# Patient Record
Sex: Male | Born: 1935 | Race: White | Hispanic: No | Smoking: Former smoker
Health system: Southern US, Community
[De-identification: ages and names within clinical notes are randomized; demographics above are authoritative.]

## PROBLEM LIST (undated history)

## (undated) DIAGNOSIS — I209 Angina pectoris, unspecified: Secondary | ICD-10-CM

## (undated) DIAGNOSIS — K219 Gastro-esophageal reflux disease without esophagitis: Secondary | ICD-10-CM

## (undated) DIAGNOSIS — M1990 Unspecified osteoarthritis, unspecified site: Secondary | ICD-10-CM

## (undated) DIAGNOSIS — J449 Chronic obstructive pulmonary disease, unspecified: Secondary | ICD-10-CM

## (undated) DIAGNOSIS — I10 Essential (primary) hypertension: Secondary | ICD-10-CM

## (undated) DIAGNOSIS — I255 Ischemic cardiomyopathy: Secondary | ICD-10-CM

## (undated) DIAGNOSIS — I4891 Unspecified atrial fibrillation: Secondary | ICD-10-CM

## (undated) DIAGNOSIS — E785 Hyperlipidemia, unspecified: Secondary | ICD-10-CM

## (undated) DIAGNOSIS — I213 ST elevation (STEMI) myocardial infarction of unspecified site: Secondary | ICD-10-CM

## (undated) DIAGNOSIS — E7800 Pure hypercholesterolemia, unspecified: Secondary | ICD-10-CM

## (undated) DIAGNOSIS — F419 Anxiety disorder, unspecified: Secondary | ICD-10-CM

## (undated) DIAGNOSIS — J189 Pneumonia, unspecified organism: Secondary | ICD-10-CM

## (undated) DIAGNOSIS — I739 Peripheral vascular disease, unspecified: Secondary | ICD-10-CM

## (undated) DIAGNOSIS — I509 Heart failure, unspecified: Secondary | ICD-10-CM

## (undated) DIAGNOSIS — I714 Abdominal aortic aneurysm, without rupture, unspecified: Secondary | ICD-10-CM

## (undated) DIAGNOSIS — I2510 Atherosclerotic heart disease of native coronary artery without angina pectoris: Secondary | ICD-10-CM

## (undated) HISTORY — DX: Atherosclerotic heart disease of native coronary artery without angina pectoris: I25.10

## (undated) HISTORY — PX: HIP ARTHROPLASTY: SHX981

## (undated) HISTORY — PX: CARDIAC CATHETERIZATION: SHX172

## (undated) HISTORY — DX: Peripheral vascular disease, unspecified: I73.9

## (undated) HISTORY — DX: Chronic obstructive pulmonary disease, unspecified: J44.9

## (undated) HISTORY — DX: Essential (primary) hypertension: I10

## (undated) HISTORY — PX: BACK SURGERY: SHX140

## (undated) HISTORY — DX: Heart failure, unspecified: I50.9

## (undated) HISTORY — PX: KNEE SURGERY: SHX244

## (undated) HISTORY — DX: Unspecified atrial fibrillation: I48.91

## (undated) HISTORY — DX: Ischemic cardiomyopathy: I25.5

## (undated) HISTORY — DX: Hyperlipidemia, unspecified: E78.5

## (undated) HISTORY — DX: Gastro-esophageal reflux disease without esophagitis: K21.9

## (undated) HISTORY — DX: Pure hypercholesterolemia, unspecified: E78.00

## (undated) HISTORY — DX: ST elevation (STEMI) myocardial infarction of unspecified site: I21.3

## (undated) HISTORY — DX: Abdominal aortic aneurysm, without rupture: I71.4

## (undated) HISTORY — DX: Pneumonia, unspecified organism: J18.9

## (undated) HISTORY — DX: Anxiety disorder, unspecified: F41.9

## (undated) HISTORY — DX: Abdominal aortic aneurysm, without rupture, unspecified: I71.40

## (undated) HISTORY — DX: Unspecified osteoarthritis, unspecified site: M19.90

---

## 2003-10-25 ENCOUNTER — Inpatient Hospital Stay (HOSPITAL_COMMUNITY): Admission: AD | Admit: 2003-10-25 | Discharge: 2003-10-29 | Payer: Self-pay | Admitting: Internal Medicine

## 2003-10-25 ENCOUNTER — Encounter: Admission: RE | Admit: 2003-10-25 | Discharge: 2003-10-25 | Payer: Self-pay | Admitting: Internal Medicine

## 2003-12-23 ENCOUNTER — Encounter: Admission: RE | Admit: 2003-12-23 | Discharge: 2003-12-23 | Payer: Self-pay | Admitting: Orthopedic Surgery

## 2003-12-27 ENCOUNTER — Ambulatory Visit (HOSPITAL_COMMUNITY): Admission: RE | Admit: 2003-12-27 | Discharge: 2003-12-27 | Payer: Self-pay | Admitting: Orthopedic Surgery

## 2003-12-27 ENCOUNTER — Ambulatory Visit (HOSPITAL_BASED_OUTPATIENT_CLINIC_OR_DEPARTMENT_OTHER): Admission: RE | Admit: 2003-12-27 | Discharge: 2003-12-27 | Payer: Self-pay | Admitting: Orthopedic Surgery

## 2004-06-19 ENCOUNTER — Encounter
Admission: RE | Admit: 2004-06-19 | Discharge: 2004-06-19 | Payer: Self-pay | Admitting: Physical Medicine and Rehabilitation

## 2004-10-16 ENCOUNTER — Encounter: Admission: RE | Admit: 2004-10-16 | Discharge: 2004-10-16 | Payer: Self-pay | Admitting: Specialist

## 2004-11-01 ENCOUNTER — Inpatient Hospital Stay (HOSPITAL_COMMUNITY): Admission: RE | Admit: 2004-11-01 | Discharge: 2004-11-04 | Payer: Self-pay | Admitting: Specialist

## 2005-02-05 ENCOUNTER — Encounter: Admission: RE | Admit: 2005-02-05 | Discharge: 2005-02-05 | Payer: Self-pay | Admitting: Specialist

## 2006-10-09 ENCOUNTER — Inpatient Hospital Stay (HOSPITAL_COMMUNITY): Admission: RE | Admit: 2006-10-09 | Discharge: 2006-10-10 | Payer: Self-pay | Admitting: Specialist

## 2007-01-02 ENCOUNTER — Ambulatory Visit (HOSPITAL_COMMUNITY): Admission: RE | Admit: 2007-01-02 | Discharge: 2007-01-02 | Payer: Self-pay | Admitting: Internal Medicine

## 2007-01-21 ENCOUNTER — Encounter: Admission: RE | Admit: 2007-01-21 | Discharge: 2007-01-21 | Payer: Self-pay | Admitting: Specialist

## 2007-07-14 ENCOUNTER — Ambulatory Visit (HOSPITAL_COMMUNITY): Admission: RE | Admit: 2007-07-14 | Discharge: 2007-07-14 | Payer: Self-pay | Admitting: *Deleted

## 2007-10-08 ENCOUNTER — Inpatient Hospital Stay (HOSPITAL_COMMUNITY): Admission: RE | Admit: 2007-10-08 | Discharge: 2007-10-12 | Payer: Self-pay | Admitting: Specialist

## 2009-06-21 ENCOUNTER — Ambulatory Visit: Payer: Self-pay | Admitting: Vascular Surgery

## 2009-09-29 ENCOUNTER — Inpatient Hospital Stay (HOSPITAL_COMMUNITY): Admission: EM | Admit: 2009-09-29 | Discharge: 2009-10-10 | Payer: Self-pay | Admitting: Emergency Medicine

## 2009-09-29 ENCOUNTER — Ambulatory Visit: Payer: Self-pay | Admitting: Cardiovascular Disease

## 2009-10-02 ENCOUNTER — Encounter: Payer: Self-pay | Admitting: Cardiovascular Disease

## 2010-04-20 ENCOUNTER — Ambulatory Visit: Payer: Self-pay | Admitting: Cardiology

## 2010-05-04 ENCOUNTER — Ambulatory Visit: Payer: Self-pay | Admitting: *Deleted

## 2010-05-29 ENCOUNTER — Ambulatory Visit: Payer: Self-pay | Admitting: Cardiology

## 2010-06-22 ENCOUNTER — Encounter: Admission: RE | Admit: 2010-06-22 | Discharge: 2010-06-22 | Payer: Self-pay | Admitting: Cardiology

## 2010-06-26 ENCOUNTER — Ambulatory Visit: Payer: Self-pay | Admitting: Cardiology

## 2010-07-10 ENCOUNTER — Ambulatory Visit: Payer: Self-pay | Admitting: Cardiology

## 2010-08-07 ENCOUNTER — Ambulatory Visit: Payer: Self-pay | Admitting: Cardiology

## 2010-08-21 ENCOUNTER — Ambulatory Visit: Payer: Self-pay | Admitting: Cardiovascular Disease

## 2010-08-27 ENCOUNTER — Ambulatory Visit: Payer: Self-pay | Admitting: Cardiology

## 2010-09-05 ENCOUNTER — Telehealth (INDEPENDENT_AMBULATORY_CARE_PROVIDER_SITE_OTHER): Payer: Self-pay | Admitting: *Deleted

## 2010-09-06 ENCOUNTER — Ambulatory Visit (HOSPITAL_COMMUNITY)
Admission: RE | Admit: 2010-09-06 | Discharge: 2010-09-06 | Payer: Self-pay | Source: Home / Self Care | Attending: Cardiology | Admitting: Cardiology

## 2010-09-06 ENCOUNTER — Ambulatory Visit: Payer: Self-pay

## 2010-09-06 ENCOUNTER — Encounter: Payer: Self-pay | Admitting: Cardiology

## 2010-09-06 ENCOUNTER — Encounter (HOSPITAL_COMMUNITY)
Admission: RE | Admit: 2010-09-06 | Discharge: 2010-10-16 | Payer: Self-pay | Source: Home / Self Care | Attending: Cardiology | Admitting: Cardiology

## 2010-09-20 ENCOUNTER — Ambulatory Visit: Payer: Self-pay | Admitting: Cardiovascular Disease

## 2010-09-26 DIAGNOSIS — I213 ST elevation (STEMI) myocardial infarction of unspecified site: Secondary | ICD-10-CM

## 2010-09-26 HISTORY — DX: ST elevation (STEMI) myocardial infarction of unspecified site: I21.3

## 2010-10-07 ENCOUNTER — Encounter: Payer: Self-pay | Admitting: Specialist

## 2010-10-16 LAB — COMPREHENSIVE METABOLIC PANEL
ALT: 16 U/L (ref 0–53)
AST: 23 U/L (ref 0–37)
Albumin: 4.2 g/dL (ref 3.5–5.2)
Alkaline Phosphatase: 73 U/L (ref 39–117)
GFR calc Af Amer: >60 mL/min (ref >60)
Glucose, Bld: 105 mg/dL — ABNORMAL HIGH (ref 70–99)
Potassium: 3.5 mEq/L (ref 3.5–5.1)
Sodium: 139 mEq/L (ref 135–145)
Total Protein: 6.9 g/dL (ref 6.0–8.3)

## 2010-10-16 LAB — DIFFERENTIAL
Basophils Absolute: 0 10*3/uL (ref 0.0–0.1)
Basophils Relative: 0 % (ref 0–1)
Eosinophils Relative: 1 % (ref 0–5)
Lymphocytes Relative: 17 % (ref 12–46)
Neutro Abs: 7.6 10*3/uL (ref 1.7–7.7)

## 2010-10-16 LAB — CBC
HCT: 44.7 % (ref 39.0–52.0)
Hemoglobin: 14.6 g/dL (ref 13.0–17.0)
RDW: 13 % (ref 11.5–15.5)
WBC: 10.4 10*3/uL (ref 4.0–10.5)

## 2010-10-16 LAB — PROTIME-INR: Prothrombin Time: 22.4 seconds — ABNORMAL HIGH (ref 11.6–15.2)

## 2010-10-16 LAB — URINE MICROSCOPIC-ADD ON

## 2010-10-16 LAB — SURGICAL PCR SCREEN
MRSA, PCR: NEGATIVE
Staphylococcus aureus: NEGATIVE

## 2010-10-17 ENCOUNTER — Encounter (INDEPENDENT_AMBULATORY_CARE_PROVIDER_SITE_OTHER): Payer: Medicare Other

## 2010-10-17 DIAGNOSIS — Z7901 Long term (current) use of anticoagulants: Secondary | ICD-10-CM

## 2010-10-18 NOTE — Progress Notes (Signed)
Summary: Nuclear pre procedure  Phone Note Outgoing Call Call back at [facility] Phone [phone]   Call placed by: Rea College, CMA,  September 05, 2010 5:10 PM Call placed to: Patient Summary of Call: Reviewed information on My[employer] Information Sheet (see scanned document for further details).  Spoke with patient.      Nuclear Med Background Indications for Stress Test: Evaluation for Ischemia, Surgical Clearance, PTCA Patency  Indications Comments: Pending (R) THR  History: Angioplasty, COPD, Echo, Heart Catheterization, Myocardial Infarction, Myocardial Perfusion Study  History Comments: '08 FAO:ZHYQM inferior defect, EF=38%; 1/11 anterior STEMI>PTCA-LAD; 3/11 Echo:EF=55%; chronic afib, AAA (3.0 x 3.2 cm)     Nuclear Pre-Procedure Cardiac Risk Factors: Family History - CAD, History of Smoking, Lipids, PVD

## 2010-10-18 NOTE — Assessment & Plan Note (Signed)
Summary: Cardiology Nuclear Testing  Nuclear Med Background Indications for Stress Test: Evaluation for Ischemia, Surgical Clearance, PTCA Patency  Indications Comments: Pending (R) THR  History: Angioplasty, COPD, Echo, Heart Catheterization, Myocardial Infarction, Myocardial Perfusion Study  History Comments: '08 ZOX:WRUEA inferior defect, EF=38%; 1/11 anterior STEMI>PTCA-LAD; 3/11 Echo:EF=55%; chronic afib, AAA (3.0 x 3.2 cm)  Symptoms: DOE, Fatigue, Palpitations, SOB    Nuclear Pre-Procedure Cardiac Risk Factors: Family History - CAD, History of Smoking, Lipids, PVD Caffeine/Decaff Intake: None NPO After: 7:30 PM Lungs: clear IV 0.9% NS with Angio Cath: 22g     IV Site: R Antecubital IV Started by: Irean Hong, RN Chest Size (in) 42     Height (in): 69 Weight (lb): 178 BMI: 26.38 Tech Comments: Held bisoprolol 24 hrs. Nebilizer treatment at 7:00 AM today at  home. Baseline diminished lung fields,no wheezing, and O2 Sat 98% RA.Patsy Edwards,RN.  Nuclear Med Study 1 or 2 day study:  1 day     Stress Test Type:  Eugenie Birks Reading MD:  Olga Millers, MD     Referring MD:  S.Tennant Resting Radionuclide:  Technetium 47m Tetrofosmin     Resting Radionuclide Dose:  11.0 mCi  Stress Radionuclide:  Technetium 41m Tetrofosmin     Stress Radionuclide Dose:  33.0 mCi   Stress Protocol  Max Systolic BP: 156 mm Hg Lexiscan: 0.4 mg   Stress Test Technologist:  Milana Na, EMT-P     Nuclear Technologist:  Doyne Keel, CNMT  Rest Procedure  Myocardial perfusion imaging was performed at rest 45 minutes following the intravenous administration of Technetium 86m Tetrofosmin.  Stress Procedure  The patient received IV Lexiscan 0.4 mg over 15-seconds.  Technetium 18m Tetrofosmin injected at 30-seconds.  There were no significant changes and freq pvcs/vcuplets with infusion.  Quantitative spect images were obtained after a 45 minute delay.  QPS Raw Data Images:  There is no  interference from other nuclear activity. Stress Images:  There is decreased uptake in the inferior wall Rest Images:  There is decreased uptake in the inferior wall, less prominent compared to the stress images. Subtraction (SDS):  These findings are consistent with prior inferior infarct and mild peri-infarct ischemia. Transient Ischemic Dilatation:  1.01  (Normal <1.22)  Lung/Heart Ratio:  0.27  (Normal <0.45)  Quantitative Gated Spect Images QGS cine images:  not gated; LVE   Overall Impression  Exercise Capacity: Lexiscan with no exercise. BP Response: Normal blood pressure response. Clinical Symptoms: No chest pain ECG Impression: No significant ST segment change suggestive of ischemia; patient in atrial fibrillation during the study. Overall Impression: Abnormal lexiscan nuclear study with prior inferior infarct and mild peri-infarct ischemia.  Appended Document: Cardiology Nuclear Testing COPY SENT DR. Creola Corn

## 2010-10-25 ENCOUNTER — Inpatient Hospital Stay (HOSPITAL_COMMUNITY)
Admission: RE | Admit: 2010-10-25 | Discharge: 2010-10-29 | DRG: 470 | Disposition: A | Discharge status: Skilled Nursing Facility | Payer: Medicare Other | Attending: Specialist | Admitting: Specialist

## 2010-10-25 ENCOUNTER — Inpatient Hospital Stay (HOSPITAL_COMMUNITY): Payer: Medicare Other

## 2010-10-25 DIAGNOSIS — M169 Osteoarthritis of hip, unspecified: Principal | ICD-10-CM | POA: Diagnosis present

## 2010-10-25 DIAGNOSIS — I2510 Atherosclerotic heart disease of native coronary artery without angina pectoris: Secondary | ICD-10-CM | POA: Diagnosis present

## 2010-10-25 DIAGNOSIS — E7800 Pure hypercholesterolemia, unspecified: Secondary | ICD-10-CM | POA: Diagnosis present

## 2010-10-25 DIAGNOSIS — M1610 Unilateral primary osteoarthritis, unspecified hip: Principal | ICD-10-CM | POA: Diagnosis present

## 2010-10-25 DIAGNOSIS — Z9861 Coronary angioplasty status: Secondary | ICD-10-CM

## 2010-10-25 DIAGNOSIS — E871 Hypo-osmolality and hyponatremia: Secondary | ICD-10-CM | POA: Diagnosis not present

## 2010-10-25 DIAGNOSIS — I714 Abdominal aortic aneurysm, without rupture, unspecified: Secondary | ICD-10-CM | POA: Diagnosis present

## 2010-10-25 DIAGNOSIS — I4891 Unspecified atrial fibrillation: Secondary | ICD-10-CM | POA: Diagnosis present

## 2010-10-25 DIAGNOSIS — J449 Chronic obstructive pulmonary disease, unspecified: Secondary | ICD-10-CM | POA: Diagnosis present

## 2010-10-25 DIAGNOSIS — I739 Peripheral vascular disease, unspecified: Secondary | ICD-10-CM | POA: Diagnosis present

## 2010-10-25 DIAGNOSIS — F17200 Nicotine dependence, unspecified, uncomplicated: Secondary | ICD-10-CM | POA: Diagnosis present

## 2010-10-25 DIAGNOSIS — D649 Anemia, unspecified: Secondary | ICD-10-CM | POA: Diagnosis not present

## 2010-10-25 DIAGNOSIS — Z96649 Presence of unspecified artificial hip joint: Secondary | ICD-10-CM

## 2010-10-25 DIAGNOSIS — I252 Old myocardial infarction: Secondary | ICD-10-CM

## 2010-10-25 DIAGNOSIS — J4489 Other specified chronic obstructive pulmonary disease: Secondary | ICD-10-CM | POA: Diagnosis present

## 2010-10-25 DIAGNOSIS — I4949 Other premature depolarization: Secondary | ICD-10-CM | POA: Diagnosis not present

## 2010-10-25 LAB — CBC
HCT: 37 % — ABNORMAL LOW (ref 39.0–52.0)
MCV: 92.5 fL (ref 78.0–100.0)
RBC: 4 MIL/uL — ABNORMAL LOW (ref 4.22–5.81)
RDW: 12.9 % (ref 11.5–15.5)
WBC: 14 10*3/uL — ABNORMAL HIGH (ref 4.0–10.5)

## 2010-10-25 LAB — BASIC METABOLIC PANEL
BUN: 17 mg/dL (ref 6–23)
Chloride: 106 mEq/L (ref 96–112)
GFR calc non Af Amer: >60 mL/min (ref >60)
Glucose, Bld: 155 mg/dL — ABNORMAL HIGH (ref 70–99)
Potassium: 4.3 mEq/L (ref 3.5–5.1)
Sodium: 140 mEq/L (ref 135–145)

## 2010-10-25 LAB — TYPE AND SCREEN

## 2010-10-26 LAB — BASIC METABOLIC PANEL
CO2: 26 mEq/L (ref 19–32)
Chloride: 104 mEq/L (ref 96–112)
GFR calc Af Amer: >60 mL/min (ref >60)
Potassium: 4.3 mEq/L (ref 3.5–5.1)

## 2010-10-26 LAB — CBC
HCT: 31.6 % — ABNORMAL LOW (ref 39.0–52.0)
Hemoglobin: 10.5 g/dL — ABNORMAL LOW (ref 13.0–17.0)
MCHC: 33.2 g/dL (ref 30.0–36.0)
MCV: 91.9 fL (ref 78.0–100.0)
RDW: 12.8 % (ref 11.5–15.5)
WBC: 9.9 10*3/uL (ref 4.0–10.5)

## 2010-10-26 LAB — PROTIME-INR: INR: 1.05 (ref 0.00–1.49)

## 2010-10-27 LAB — CBC
HCT: 33.2 % — ABNORMAL LOW (ref 39.0–52.0)
Hemoglobin: 10.9 g/dL — ABNORMAL LOW (ref 13.0–17.0)
MCH: 30.3 pg (ref 26.0–34.0)
MCV: 92.2 fL (ref 78.0–100.0)
RBC: 3.6 MIL/uL — ABNORMAL LOW (ref 4.22–5.81)
WBC: 11.8 10*3/uL — ABNORMAL HIGH (ref 4.0–10.5)

## 2010-10-27 LAB — BASIC METABOLIC PANEL
CO2: 29 mEq/L (ref 19–32)
Calcium: 8.6 mg/dL (ref 8.4–10.5)
Chloride: 99 mEq/L (ref 96–112)
GFR calc Af Amer: >60 mL/min (ref >60)
Glucose, Bld: 140 mg/dL — ABNORMAL HIGH (ref 70–99)
Potassium: 4.3 mEq/L (ref 3.5–5.1)
Sodium: 137 mEq/L (ref 135–145)

## 2010-10-28 LAB — PROTIME-INR: Prothrombin Time: 15.1 seconds (ref 11.6–15.2)

## 2010-10-28 LAB — BASIC METABOLIC PANEL
Chloride: 96 mEq/L (ref 96–112)
GFR calc non Af Amer: >60 mL/min (ref >60)
Glucose, Bld: 138 mg/dL — ABNORMAL HIGH (ref 70–99)
Potassium: 3.9 mEq/L (ref 3.5–5.1)
Sodium: 131 mEq/L — ABNORMAL LOW (ref 135–145)

## 2010-10-28 LAB — CBC
HCT: 32.1 % — ABNORMAL LOW (ref 39.0–52.0)
Hemoglobin: 10.7 g/dL — ABNORMAL LOW (ref 13.0–17.0)
MCH: 30.6 pg (ref 26.0–34.0)
MCV: 91.7 fL (ref 78.0–100.0)
RBC: 3.5 MIL/uL — ABNORMAL LOW (ref 4.22–5.81)

## 2010-10-29 DIAGNOSIS — M79609 Pain in unspecified limb: Secondary | ICD-10-CM

## 2010-10-29 LAB — CBC
HCT: 29.5 % — ABNORMAL LOW (ref 39.0–52.0)
Hemoglobin: 9.6 g/dL — ABNORMAL LOW (ref 13.0–17.0)
MCHC: 32.5 g/dL (ref 30.0–36.0)
RBC: 3.17 MIL/uL — ABNORMAL LOW (ref 4.22–5.81)

## 2010-10-29 LAB — PROTIME-INR: INR: 1.34 (ref 0.00–1.49)

## 2010-10-30 NOTE — Op Note (Signed)
NAMEMALCOLM, QUAST               ACCOUNT NO.:  [id number]  MEDICAL RECORD NO.:  [id number]           PATIENT TYPE:  I  LOCATION:  1445                         FACILITY:  [facility]  PHYSICIAN:  Jene Every, M.D.    DATE OF BIRTH:  Sep 16, 1928  DATE OF PROCEDURE: DATE OF DISCHARGE:                              OPERATIVE REPORT   PREOPERATIVE DIAGNOSIS:  Degenerative joint disease, right hip.  POSTOPERATIVE DIAGNOSIS:  Degenerative joint disease, right hip.  PROCEDURE PERFORMED:  Right total hip arthroplasty.  ANESTHESIA:  General.  ASSISTANT:  Georges Lynch. Gioffre, M.D.  COMPONENTS:  DePuy 13.5 Prodigy stem, 54 Duraloc cup and a 10 degree polyethylene liner with a +5, 36 metal ball.  BRIEF HISTORY:  An 77 year old with end-stage osteoarthrosis eroding into the acetabulum.  He was disabled, unable to walk, indicated for replacement of a degenerated hip.  Risks and benefits discussed including bleeding, infection, damage to neurovascular structures, DVT, PE, anesthetic complications, dislocation, leg-length discrepancy, etc.  PROCEDURE IN DETAIL:  Placed in supine position, after induction of adequate general anesthesia and a gram of Kefzol, was placed in left lateral decubitus position.  All bony prominences were well padded.  Hip holder was utilized while the leg was padded.  TED and PAS stockings utilized.  Right hip and peritrochanteric region was prepped and draped in usual sterile fashion and a standard posterolateral approach to the hip was then made based on the trochanter.  Subcutaneous tissue was dissected with electrocautery to achieve hemostasis.  Fascia lata identified by a long skin incision.  Adductor was notified, tenotomy performed.  External rotators were identified.  Piriformis was tagged and detached from its insertion reflected posteriorly.  The capsule was identified.  T-shaped capsulotomy was performed.  The capsule was excised.  Severe collapse of the  femoral head and arthritis was noted. No evidence of infection.  Oscillating saw utilized to perform a femoral neck osteotomy one fingerbreadth above the lesser trochanter.  It was then removed.  We then, with the leg in the appropriate position, used a cookie cutter to enter the femoral canal and then it was an initiator followed by sequential reaming to 13 mm with good cortical contact.  He had a total hip on the opposite side which was 13.5.  We used a calcar reamer.  Prior to this reaming, we copiously irrigated the canal.  Attention was then turned toward the acetabulum.  Residual capsule was removed.  The sciatic nerve was protected at all times with the piriformis and external rotators reflected posteriorly.  I removed the labrum and a remnant of the capsule and any osteophytes with a rongeur. We identified the fovea.  Curetted the acetabulum removing any soft tissue.  We started the reamer at 45, medialized in an appropriate version of 15 degrees of anteversion and 45 degrees of abduction.  We sequentially reamed to 53 mm in diameter with good cortical content in the peripheral rim, appropriate version.  Good cancellous bleeding tissue noted at the center and in the rim.  There was no compromise of the acetabular wall.  Placed a trial acetabular component of the appropriate  version, was satisfactory.  Then placed a trial of femur and a trial acetabular liner and a +1.5 head.  We had good reduction, stable hip and appropriate version.  All components were then removed.  Wound copiously irrigated.  Then, we turned our attention towards the permanent acetabular cup, the Duraloc porous-coated cup was used, a 54 and appropriate version.  We impacted into place in the appropriate version and it did bottom out and it was stable after impaction to attempt dislodgement.  Following this, I put 2 screws up into the acetabular column superiorly and posteriorly using a digital  palpation posteriorly to assure no penetration of the cortex.  A 25 mm and a 15 mm screw was utilized to good cortical purchase.  This was copiously irrigated using a 10 degree posterior liner polyethylene and impacted it into place.  Then injected with a Glorious Peach and it was secured.  Attention was turned back towards the femur.  In preparing that, copiously irrigated in appropriate version.  We impacted a porous-coated prodigy to the calcar with excellent fit.  There was no fracture appreciated at the femoral neck or the femur.  We tried a +1.5, felt a +5 was appropriate with a 36 head after the reduction and impaction and after clean trunnion, we had full extension, good flexion and internal rotation without dislocation.  Minimal translation with longitudinal pull.  Leg lengths were equivalent.  We did feel that it was an appropriate length, permanent 36+5 head was impacted on the trunnion and we re-examined it and stable throughout with full range.  Also in full extension.  There was no impingement of the acetabulum noted.  Wound copiously irrigated with electrocautery to achieve hemostasis.  No active bleeding was noted.  I then repaired the fascia lata with #1 Vicryl interrupted figure-of-8 sutures in the adductor tenotomy with #1 Vicryl interrupted figure-of-8 sutures subcutaneously and 2-0 Vicryl simple sutures.  Skin was reapproximated with staples and was dressed sterilely.  He was placed supine on the hospital bed.  Leg lengths were equivalent.  Good pulses.  Knee immobilizer placed, transported to the Recovery in satisfactory condition.  The patient tolerated the procedure well with no complications.  Blood loss was 350 cc.     Jene Every, M.D.     Cordelia Pen  D:  10/25/2010  T:  10/25/2010  Job:  [id number]  Electronically Signed by Jene Every M.D. on 10/30/2010 12:17:19 PM

## 2010-11-05 NOTE — Discharge Summary (Signed)
[name], [name]               ACCOUNT NO.:  [id number]  MEDICAL RECORD NO.:  [id number]           PATIENT TYPE:  I  LOCATION:  1445                         FACILITY:  [facility]  PHYSICIAN:  [name], M.D.    DATE OF BIRTH:  [birth year]  DATE OF ADMISSION:  10/25/2010 DATE OF DISCHARGE:  10/29/2010                              DISCHARGE SUMMARY   ADMISSION DIAGNOSES: 1. Degenerative joint disease, right hip. 2. Chronic obstructive pulmonary disease. 3. Coronary artery disease, most recent myocardial infarction on     January 2011. 4. Stable abdominal aneurysm. 5. Hypercholesteremia. 6. History of atrial fibrillation. 7. Osteoarthritis. 8. Peripheral vascular disease.  DISCHARGE DIAGNOSES: 1. Degenerative joint disease, right hip. 2. Chronic obstructive pulmonary disease. 3. Coronary artery disease, most recent myocardial infarction on     January 2011. 4. Stable abdominal aneurysm. 5. Hypercholesteremia.6. History of atrial fibrillation. 7. Osteoarthritis. 8. Peripheral vascular disease. 9. Status post right total hip arthroplasty. 10.Asymptomatic premature ventricular contractions, resolved. 11.Hyponatremia. 12.Asymptomatic postoperative anemia.  HISTORY:  Mr. [name] is well-known patient to our practice.  He has undergone multiple surgeries in the past.  Unfortunately, he sustained progressive right lower extremity pain that is fairly disabling for him. He describes the pain better with rest, worst with activity.  X-rays do reveal severe osteoarthritis of the right hip with collapse of the lateral femoral head.  It was felt at this time he would benefit from a total hip arthroplasty.  Risks and benefits of the surgery were discussed with the patient.  He does wish to proceed.  PROCEDURE:  The patient was taken to the OR, underwent right total hip arthroplasty.  Surgeon, Dr. Jene [name].  Assistant, Dr. Ranee Gosselin.  Anesthesia, general.  Complications,  none.  CONSULT:  PT/OT Care Management.  We did consult [facility], however, they never came to see the patient.  LABORATORY DATA:  Admission CBC showed white cell count 14.0, hemoglobin 12.4, hematocrit 37.5.  This was monitored throughout the hospital course.  White cell count normalized at time of discharge 5.8, hemoglobin was 9.6, hematocrit 29.5.  Slightly decreased platelets of 136.  Routine coagulation studies done preoperatively shows PT of 13.4, INR of 1.00.  This was monitored throughout the hospital course.  After the patient was placed on Coumadin at time of discharge his INR was 1.34.  We will bridge with Lovenox until he is therapeutic.  Routine chemistries done showed sodium initially of 140, potassium 4.3 with normal BUN and creatinine.  This was monitored throughout the hospital stay.  He had slight decrease in sodium to 131, potassium remained stable at 3.9.  Again with normal BUN and creatinine.  Blood type is O positive.  Preop chest x-ray showed stable chest exam, no acute process noted.  Postoperative films showed right total hip arthroplasty without complications.  HOSPITAL COURSE:  The patient was admitted, taken to the OR and underwent the above-stated procedures without difficulty.  He was then transferred to the PACU and then to the telemetry floor for postoperative care.  Postoperatively, the patient did fairly well. Initially pain was well controlledwith PCA.  He was  eventually weaned off PCA onto p.o. medications.  He denied any chest pain, shortness of breath. No nausea or vomiting.  Vital signs remained stable.  Hemoglobin was stable at 10.5.  Coumadin was started for DVT prophylaxis.  Discharge planning was initiated.  On postoperative day #2, the incision was clean, dry and intact.  No evidence of infection.  Daily dressing change was initiated.  The patient was slow to progress with physical therapy. He did have a couple of runs of PVCs and  V-tach.  Cardiology was consulted, Trish with [place] was called.  Unfortunately this was over the weekend and  no one from cardiology evaluated the patient but he did remain stable.  He did continue to have couple of runs of bigeminy but he again was asymptomatic.  He did have some pain control issues.  Medications were adjusted.  Over the course of next few days, the patient continued to do well.  On postoperative day #4, I felt the patient was stable to be discharged to nursing facility of choice, probably began Lovenox for bridging until he is therapeutic on his Coumadin.  Also obtain a Doppler prior to discharge to rule out evidence for DVT since he has been subtherapeutic for several days now.  DISPOSITION:  The patient stable to be discharged to [facility]  ACTIVITIES:  He is to be partial weightbearing on the right lower extremity, 25% to 50%.  Total hip precautions should be initiated.  Use knee immobilizer while in bed or pillow between legs to prevent dislocation.  Wound care to change his dressing daily.  He is to follow up with Dr. Shelle Iron in approximately 10 or 14 days for suture removal and x-rays.  MEDICATIONS:  As per med rec sheet including Lovenox bridge until therapeutic on Coumadin with INR between 2 and 2.5.  DIET:  As tolerated.  CONDITION ON DISCHARGE:  Stable.  FINAL DIAGNOSIS:  Status post right total hip arthroplasty.    [name], P.A.   ______________________________ [name], M.D.   CS/MEDQ  D:  10/29/2010  T:  10/29/2010  Job:  [id number]  Electronically Signed by [name] P.A. on 11/02/2010 11:23:21 AM Electronically Signed by [name] M.D. on 11/05/2010 07:52:56 AM

## 2010-11-13 ENCOUNTER — Inpatient Hospital Stay (HOSPITAL_COMMUNITY)
Admission: EM | Admit: 2010-11-13 | Discharge: 2010-11-23 | DRG: 189 | Disposition: A | Discharge status: Skilled Nursing Facility | Payer: Medicare Other | Attending: Internal Medicine | Admitting: Internal Medicine

## 2010-11-13 ENCOUNTER — Emergency Department (HOSPITAL_COMMUNITY): Payer: Medicare Other

## 2010-11-13 DIAGNOSIS — J441 Chronic obstructive pulmonary disease with (acute) exacerbation: Secondary | ICD-10-CM | POA: Diagnosis present

## 2010-11-13 DIAGNOSIS — I252 Old myocardial infarction: Secondary | ICD-10-CM

## 2010-11-13 DIAGNOSIS — Z66 Do not resuscitate: Secondary | ICD-10-CM | POA: Diagnosis present

## 2010-11-13 DIAGNOSIS — E785 Hyperlipidemia, unspecified: Secondary | ICD-10-CM | POA: Diagnosis present

## 2010-11-13 DIAGNOSIS — I10 Essential (primary) hypertension: Secondary | ICD-10-CM | POA: Diagnosis present

## 2010-11-13 DIAGNOSIS — Z96649 Presence of unspecified artificial hip joint: Secondary | ICD-10-CM

## 2010-11-13 DIAGNOSIS — I2510 Atherosclerotic heart disease of native coronary artery without angina pectoris: Secondary | ICD-10-CM | POA: Diagnosis present

## 2010-11-13 DIAGNOSIS — Z7901 Long term (current) use of anticoagulants: Secondary | ICD-10-CM

## 2010-11-13 DIAGNOSIS — E119 Type 2 diabetes mellitus without complications: Secondary | ICD-10-CM | POA: Diagnosis present

## 2010-11-13 DIAGNOSIS — I509 Heart failure, unspecified: Secondary | ICD-10-CM | POA: Diagnosis present

## 2010-11-13 DIAGNOSIS — I4891 Unspecified atrial fibrillation: Secondary | ICD-10-CM | POA: Diagnosis present

## 2010-11-13 DIAGNOSIS — J9620 Acute and chronic respiratory failure, unspecified whether with hypoxia or hypercapnia: Principal | ICD-10-CM | POA: Diagnosis present

## 2010-11-13 DIAGNOSIS — I714 Abdominal aortic aneurysm, without rupture, unspecified: Secondary | ICD-10-CM | POA: Diagnosis present

## 2010-11-13 DIAGNOSIS — Z9861 Coronary angioplasty status: Secondary | ICD-10-CM

## 2010-11-13 DIAGNOSIS — I5022 Chronic systolic (congestive) heart failure: Secondary | ICD-10-CM | POA: Diagnosis present

## 2010-11-13 DIAGNOSIS — E876 Hypokalemia: Secondary | ICD-10-CM | POA: Diagnosis present

## 2010-11-13 LAB — APTT: aPTT: 68 seconds — ABNORMAL HIGH (ref 24–37)

## 2010-11-13 LAB — DIFFERENTIAL
Basophils Absolute: 0 10*3/uL (ref 0.0–0.1)
Lymphocytes Relative: 13 % (ref 12–46)
Lymphs Abs: 1.1 10*3/uL (ref 0.7–4.0)
Neutro Abs: 6.5 10*3/uL (ref 1.7–7.7)
Neutrophils Relative %: 81 % — ABNORMAL HIGH (ref 43–77)

## 2010-11-13 LAB — BLOOD GAS, ARTERIAL
Acid-Base Excess: 4.6 mmol/L — ABNORMAL HIGH (ref 0.0–2.0)
Delivery systems: POSITIVE
Drawn by: 326301
FIO2: 0.4 %
Inspiratory PAP: 15
O2 Saturation: 97 %
RATE: 10 resp/min
TCO2: 25.8 mmol/L (ref 0–100)
pCO2 arterial: 43.3 mmHg (ref 35.0–45.0)
pO2, Arterial: 87.6 mmHg (ref 80.0–100.0)

## 2010-11-13 LAB — PROTIME-INR: INR: 4.59 — ABNORMAL HIGH (ref 0.00–1.49)

## 2010-11-13 LAB — CBC
HCT: 40.2 % (ref 39.0–52.0)
Hemoglobin: 13 g/dL (ref 13.0–17.0)
MCV: 92 fL (ref 78.0–100.0)
RBC: 4.37 MIL/uL (ref 4.22–5.81)
WBC: 8.1 10*3/uL (ref 4.0–10.5)

## 2010-11-13 LAB — COMPREHENSIVE METABOLIC PANEL
Albumin: 3.4 g/dL — ABNORMAL LOW (ref 3.5–5.2)
Alkaline Phosphatase: 80 U/L (ref 39–117)
BUN: 15 mg/dL (ref 6–23)
Creatinine, Ser: 0.8 mg/dL (ref 0.4–1.5)
Glucose, Bld: 155 mg/dL — ABNORMAL HIGH (ref 70–99)
Total Bilirubin: 0.8 mg/dL (ref 0.3–1.2)
Total Protein: 6.2 g/dL (ref 6.0–8.3)

## 2010-11-13 LAB — POCT CARDIAC MARKERS
CKMB, poc: 3.3 ng/mL (ref 1.0–8.0)
Myoglobin, poc: 110 ng/mL (ref 12–200)
Troponin i, poc: <0.05 ng/mL (ref 0.00–0.09)

## 2010-11-13 LAB — BRAIN NATRIURETIC PEPTIDE: Pro B Natriuretic peptide (BNP): 229 pg/mL — ABNORMAL HIGH (ref 0.0–100.0)

## 2010-11-14 LAB — INFLUENZA PANEL BY PCR (TYPE A & B): H1N1 flu by pcr: NOT DETECTED

## 2010-11-14 LAB — CARDIAC PANEL(CRET KIN+CKTOT+MB+TROPI)
CK, MB: 2.6 ng/mL (ref 0.3–4.0)
CK, MB: 2.3 ng/mL (ref 0.3–4.0)
Relative Index: INVALID (ref 0.0–2.5)
Total CK: 45 U/L (ref 7–232)
Total CK: 45 U/L (ref 7–232)
Troponin I: 0.07 ng/mL — ABNORMAL HIGH (ref 0.00–0.06)
Troponin I: 0.06 ng/mL (ref 0.00–0.06)

## 2010-11-14 LAB — LIPID PANEL: VLDL: 12 mg/dL (ref 0–40)

## 2010-11-14 LAB — PROTIME-INR
INR: 2.24 — ABNORMAL HIGH (ref 0.00–1.49)
Prothrombin Time: 24.9 seconds — ABNORMAL HIGH (ref 11.6–15.2)

## 2010-11-14 LAB — CBC
Hemoglobin: 11.1 g/dL — ABNORMAL LOW (ref 13.0–17.0)
MCH: 29.4 pg (ref 26.0–34.0)
MCHC: 32.4 g/dL (ref 30.0–36.0)
RDW: 13.3 % (ref 11.5–15.5)

## 2010-11-14 LAB — MRSA PCR SCREENING: MRSA by PCR: NEGATIVE

## 2010-11-14 LAB — TSH: TSH: 0.359 u[IU]/mL (ref 0.350–4.500)

## 2010-11-14 LAB — BASIC METABOLIC PANEL
CO2: 31 mEq/L (ref 19–32)
Chloride: 99 mEq/L (ref 96–112)
GFR calc Af Amer: >60 mL/min (ref >60)
Potassium: 3.4 mEq/L — ABNORMAL LOW (ref 3.5–5.1)
Sodium: 138 mEq/L (ref 135–145)

## 2010-11-14 LAB — MAGNESIUM: Magnesium: 2 mg/dL (ref 1.5–2.5)

## 2010-11-15 LAB — CBC
HCT: 33.3 % — ABNORMAL LOW (ref 39.0–52.0)
MCH: 28.8 pg (ref 26.0–34.0)
MCV: 93 fL (ref 78.0–100.0)
RDW: 13.5 % (ref 11.5–15.5)
WBC: 12.1 10*3/uL — ABNORMAL HIGH (ref 4.0–10.5)

## 2010-11-15 LAB — BASIC METABOLIC PANEL
CO2: 32 mEq/L (ref 19–32)
Chloride: 101 mEq/L (ref 96–112)
Creatinine, Ser: 0.8 mg/dL (ref 0.4–1.5)
GFR calc Af Amer: >60 mL/min (ref >60)
Glucose, Bld: 190 mg/dL — ABNORMAL HIGH (ref 70–99)
Sodium: 139 mEq/L (ref 135–145)

## 2010-11-16 LAB — GLUCOSE, CAPILLARY
Glucose-Capillary: 283 mg/dL — ABNORMAL HIGH (ref 70–99)
Glucose-Capillary: 178 mg/dL — ABNORMAL HIGH (ref 70–99)

## 2010-11-16 LAB — BASIC METABOLIC PANEL
BUN: 17 mg/dL (ref 6–23)
CO2: 30 mEq/L (ref 19–32)
Chloride: 103 mEq/L (ref 96–112)
Glucose, Bld: 188 mg/dL — ABNORMAL HIGH (ref 70–99)
Potassium: 3.7 mEq/L (ref 3.5–5.1)

## 2010-11-16 LAB — CBC
HCT: 34.6 % — ABNORMAL LOW (ref 39.0–52.0)
Hemoglobin: 10.8 g/dL — ABNORMAL LOW (ref 13.0–17.0)
MCH: 29.1 pg (ref 26.0–34.0)
MCHC: 31.2 g/dL (ref 30.0–36.0)
MCV: 93.3 fL (ref 78.0–100.0)

## 2010-11-16 LAB — HEMOGLOBIN A1C: Hgb A1c MFr Bld: 6.5 % — ABNORMAL HIGH (ref <5.7)

## 2010-11-17 LAB — CBC
HCT: 36.9 % — ABNORMAL LOW (ref 39.0–52.0)
Hemoglobin: 11.5 g/dL — ABNORMAL LOW (ref 13.0–17.0)
MCH: 29.2 pg (ref 26.0–34.0)
MCHC: 31.2 g/dL (ref 30.0–36.0)
RBC: 3.94 MIL/uL — ABNORMAL LOW (ref 4.22–5.81)

## 2010-11-17 LAB — PROTIME-INR: INR: 3.94 — ABNORMAL HIGH (ref 0.00–1.49)

## 2010-11-17 LAB — BASIC METABOLIC PANEL
CO2: 32 mEq/L (ref 19–32)
Calcium: 8.5 mg/dL (ref 8.4–10.5)
Chloride: 99 mEq/L (ref 96–112)
Creatinine, Ser: 0.6 mg/dL (ref 0.4–1.5)
Glucose, Bld: 175 mg/dL — ABNORMAL HIGH (ref 70–99)
Sodium: 139 mEq/L (ref 135–145)

## 2010-11-17 LAB — GLUCOSE, CAPILLARY: Glucose-Capillary: 258 mg/dL — ABNORMAL HIGH (ref 70–99)

## 2010-11-18 LAB — GLUCOSE, CAPILLARY
Glucose-Capillary: 213 mg/dL — ABNORMAL HIGH (ref 70–99)
Glucose-Capillary: 172 mg/dL — ABNORMAL HIGH (ref 70–99)
Glucose-Capillary: 184 mg/dL — ABNORMAL HIGH (ref 70–99)

## 2010-11-18 LAB — CBC
HCT: 34.9 % — ABNORMAL LOW (ref 39.0–52.0)
Hemoglobin: 11.1 g/dL — ABNORMAL LOW (ref 13.0–17.0)
MCH: 29.4 pg (ref 26.0–34.0)
MCHC: 31.8 g/dL (ref 30.0–36.0)
MCV: 92.6 fL (ref 78.0–100.0)
RBC: 3.77 MIL/uL — ABNORMAL LOW (ref 4.22–5.81)

## 2010-11-18 LAB — BASIC METABOLIC PANEL
BUN: 15 mg/dL (ref 6–23)
CO2: 37 mEq/L — ABNORMAL HIGH (ref 19–32)
Chloride: 98 mEq/L (ref 96–112)
Glucose, Bld: 115 mg/dL — ABNORMAL HIGH (ref 70–99)
Potassium: 3.2 mEq/L — ABNORMAL LOW (ref 3.5–5.1)
Sodium: 139 mEq/L (ref 135–145)

## 2010-11-18 LAB — PROTIME-INR: Prothrombin Time: 34.8 seconds — ABNORMAL HIGH (ref 11.6–15.2)

## 2010-11-19 ENCOUNTER — Inpatient Hospital Stay (HOSPITAL_COMMUNITY): Payer: Medicare Other

## 2010-11-19 LAB — CBC
MCV: 92.6 fL (ref 78.0–100.0)
RBC: 3.93 MIL/uL — ABNORMAL LOW (ref 4.22–5.81)
RDW: 13.4 % (ref 11.5–15.5)
WBC: 11.4 10*3/uL — ABNORMAL HIGH (ref 4.0–10.5)

## 2010-11-19 LAB — PROTIME-INR: INR: 3.04 — ABNORMAL HIGH (ref 0.00–1.49)

## 2010-11-19 LAB — BASIC METABOLIC PANEL
CO2: 38 mEq/L — ABNORMAL HIGH (ref 19–32)
Calcium: 8.3 mg/dL — ABNORMAL LOW (ref 8.4–10.5)
Creatinine, Ser: 0.73 mg/dL (ref 0.4–1.5)
Glucose, Bld: 102 mg/dL — ABNORMAL HIGH (ref 70–99)
Sodium: 140 mEq/L (ref 135–145)

## 2010-11-19 LAB — GLUCOSE, CAPILLARY
Glucose-Capillary: 186 mg/dL — ABNORMAL HIGH (ref 70–99)
Glucose-Capillary: 225 mg/dL — ABNORMAL HIGH (ref 70–99)

## 2010-11-20 DIAGNOSIS — R0902 Hypoxemia: Secondary | ICD-10-CM

## 2010-11-20 DIAGNOSIS — J441 Chronic obstructive pulmonary disease with (acute) exacerbation: Secondary | ICD-10-CM

## 2010-11-20 LAB — GLUCOSE, CAPILLARY
Glucose-Capillary: 121 mg/dL — ABNORMAL HIGH (ref 70–99)
Glucose-Capillary: 184 mg/dL — ABNORMAL HIGH (ref 70–99)

## 2010-11-20 LAB — CULTURE, BLOOD (ROUTINE X 2)
Culture  Setup Time: 201202290237
Culture: NO GROWTH

## 2010-11-20 LAB — BLOOD GAS, ARTERIAL
Acid-Base Excess: 9.1 mmol/L — ABNORMAL HIGH (ref 0.0–2.0)
Drawn by: 28337
O2 Content: 4 L/min
O2 Saturation: 90.8 %
Patient temperature: 98.6

## 2010-11-20 LAB — CBC
HCT: 37.6 % — ABNORMAL LOW (ref 39.0–52.0)
Hemoglobin: 11.9 g/dL — ABNORMAL LOW (ref 13.0–17.0)
MCV: 93.3 fL (ref 78.0–100.0)
RDW: 13.5 % (ref 11.5–15.5)
WBC: 16.5 10*3/uL — ABNORMAL HIGH (ref 4.0–10.5)

## 2010-11-20 LAB — BASIC METABOLIC PANEL
CO2: 36 mEq/L — ABNORMAL HIGH (ref 19–32)
Chloride: 98 mEq/L (ref 96–112)
GFR calc Af Amer: >60 mL/min (ref >60)
Potassium: 3.4 mEq/L — ABNORMAL LOW (ref 3.5–5.1)
Sodium: 139 mEq/L (ref 135–145)

## 2010-11-20 LAB — BRAIN NATRIURETIC PEPTIDE: Pro B Natriuretic peptide (BNP): 66.8 pg/mL (ref 0.0–100.0)

## 2010-11-21 ENCOUNTER — Inpatient Hospital Stay (HOSPITAL_COMMUNITY): Payer: Medicare Other

## 2010-11-21 LAB — CBC
HCT: 37.5 % — ABNORMAL LOW (ref 39.0–52.0)
Hemoglobin: 11.8 g/dL — ABNORMAL LOW (ref 13.0–17.0)
MCHC: 31.5 g/dL (ref 30.0–36.0)
RDW: 13.4 % (ref 11.5–15.5)
WBC: 18.6 10*3/uL — ABNORMAL HIGH (ref 4.0–10.5)

## 2010-11-21 LAB — BASIC METABOLIC PANEL
BUN: 18 mg/dL (ref 6–23)
Chloride: 97 mEq/L (ref 96–112)
Creatinine, Ser: 0.85 mg/dL (ref 0.4–1.5)
GFR calc Af Amer: >60 mL/min (ref >60)
GFR calc non Af Amer: >60 mL/min (ref >60)
Potassium: 3.9 mEq/L (ref 3.5–5.1)

## 2010-11-21 LAB — URINALYSIS, ROUTINE W REFLEX MICROSCOPIC
Glucose, UA: NEGATIVE mg/dL
Hgb urine dipstick: NEGATIVE
Specific Gravity, Urine: 1.015 (ref 1.005–1.030)
pH: 5 (ref 5.0–8.0)

## 2010-11-21 LAB — BRAIN NATRIURETIC PEPTIDE: Pro B Natriuretic peptide (BNP): 62 pg/mL (ref 0.0–100.0)

## 2010-11-21 LAB — GLUCOSE, CAPILLARY
Glucose-Capillary: 196 mg/dL — ABNORMAL HIGH (ref 70–99)
Glucose-Capillary: 285 mg/dL — ABNORMAL HIGH (ref 70–99)

## 2010-11-21 LAB — PROTIME-INR
INR: 2.19 — ABNORMAL HIGH (ref 0.00–1.49)
Prothrombin Time: 24.5 seconds — ABNORMAL HIGH (ref 11.6–15.2)

## 2010-11-22 DIAGNOSIS — J441 Chronic obstructive pulmonary disease with (acute) exacerbation: Secondary | ICD-10-CM

## 2010-11-22 LAB — CBC
HCT: 37.7 % — ABNORMAL LOW (ref 39.0–52.0)
Hemoglobin: 12 g/dL — ABNORMAL LOW (ref 13.0–17.0)
MCV: 92 fL (ref 78.0–100.0)
RBC: 4.1 MIL/uL — ABNORMAL LOW (ref 4.22–5.81)
WBC: 27.4 10*3/uL — ABNORMAL HIGH (ref 4.0–10.5)

## 2010-11-22 LAB — PROTIME-INR: INR: 2.49 — ABNORMAL HIGH (ref 0.00–1.49)

## 2010-11-22 LAB — GLUCOSE, CAPILLARY
Glucose-Capillary: 178 mg/dL — ABNORMAL HIGH (ref 70–99)
Glucose-Capillary: 244 mg/dL — ABNORMAL HIGH (ref 70–99)
Glucose-Capillary: 198 mg/dL — ABNORMAL HIGH (ref 70–99)

## 2010-11-22 LAB — BRAIN NATRIURETIC PEPTIDE: Pro B Natriuretic peptide (BNP): 231 pg/mL — ABNORMAL HIGH (ref 0.0–100.0)

## 2010-11-22 LAB — BASIC METABOLIC PANEL
CO2: 30 mEq/L (ref 19–32)
Chloride: 96 mEq/L (ref 96–112)
GFR calc Af Amer: >60 mL/min (ref >60)
Sodium: 133 mEq/L — ABNORMAL LOW (ref 135–145)

## 2010-11-23 DIAGNOSIS — R0902 Hypoxemia: Secondary | ICD-10-CM

## 2010-11-23 LAB — BASIC METABOLIC PANEL
CO2: 32 mEq/L (ref 19–32)
Calcium: 8.3 mg/dL — ABNORMAL LOW (ref 8.4–10.5)
Chloride: 98 mEq/L (ref 96–112)
GFR calc Af Amer: >60 mL/min (ref >60)
Glucose, Bld: 143 mg/dL — ABNORMAL HIGH (ref 70–99)
Sodium: 136 mEq/L (ref 135–145)

## 2010-11-23 LAB — PROTIME-INR
INR: 2.96 — ABNORMAL HIGH (ref 0.00–1.49)
Prothrombin Time: 30.9 seconds — ABNORMAL HIGH (ref 11.6–15.2)

## 2010-11-23 LAB — CBC
HCT: 34.4 % — ABNORMAL LOW (ref 39.0–52.0)
Hemoglobin: 11 g/dL — ABNORMAL LOW (ref 13.0–17.0)
MCH: 29.5 pg (ref 26.0–34.0)
MCHC: 32 g/dL (ref 30.0–36.0)
RBC: 3.73 MIL/uL — ABNORMAL LOW (ref 4.22–5.81)

## 2010-11-23 LAB — GLUCOSE, CAPILLARY: Glucose-Capillary: 240 mg/dL — ABNORMAL HIGH (ref 70–99)

## 2010-11-29 NOTE — Consult Note (Signed)
[name], [name]               ACCOUNT NO.:  [id number]  MEDICAL RECORD NO.:  [id number]           PATIENT TYPE:  I  LOCATION:  1404                         [facility]ACILITY[facility]  [facility]  PHYSICIAN:  Kalman Shan, MD   DATE OF BIRTH:  09/11/1928  DATE OF CONSULTATION:  11/20/2010 DATE OF DISCHARGE:                                CONSULTATION   PULMONARY CONSULTATION:  CONSULTATION REQUESTED BY:  Buren Kos, MD  REASON FOR CONSULTATION:  Worsening COPD exacerbation despite medical therapy for the last 6 days.  HISTORY OF PRESENT ILLNESS:  [name] is an 77 year old gentleman who suffered an MI in the mid part of January 2011, inferior wall MI with left ventricular ejection fraction 25%.  He has other multiple medical problems.  He presented then in February with right hip pain and underwent right total hip arthroplasty and then was discharged on February 13 to [facility] where he has been convalescing.  At baseline prior to the MI, he was very ambulant with a cane and the only limiting factor to his exertion was his right hip pain.  He has been on Asmanex for several years on account of presumed COPD, although he sees no pulmonologist and does not recollect having had PFTs, and he denied being on home oxygen.  He was at [facility] convalescing but on November 13, 2010, he had subjective fevers, possible left lower lobe infiltrate at [facility] and was treated with antibiotics but failed antibiotic therapy and got admitted on the 28th with a presumed diagnosis of COPD exacerbation.  Chest x-ray here at admission was clear.  He initially was treated with BiPAP, antibiotics, and steroids. Several days later, on November 16, 2010, he was started on a rapid 8-day taper of p.o. prednisone, and also antibiotic Avelox was started.  With this he seemed to improve.  His BNP's have been normal less than 100. However, since November 19, 2010, while on a prednisone dose of 20 mg per day, he  started complaining of more shortness of breath and his hypoxemia worsened from 2 L requirement of 4 L requirement.  Solu-Medrol was started on the morning of November 20, 2010, and after that he started feeling better.  Pulmonary consultation has been called to sort his issues out.  ALLERGIES:  MORPHINE, NOVOCAINE.  PAST MEDICAL HISTORY: 1. COPD, not otherwise specified.  No pulmonologist.  On Asmanex for     several years, apparently prescribed by Dr. Roger Shelter. 2. Coronary artery disease. 3. Abdominal aneurysm. 4. Hyperlipidemia. 5. History of atrial fibrillation, on Coumadin therapy. 6. Osteoarthritis. 7. Peripheral vascular disease. 8. Anxiety. 9. GERD.  ADMISSION MEDICATIONS:  Include Robaxin, vitamin D, Lasix, Cardizem, bisoprolol, Asmanex, Imdur, Crestor, Coumadin, Maalox, oxycodone, and Apresoline  CURRENT MEDICATIONS:  Include Solu-Medrol and Avelox.  SOCIAL HISTORY:  Widowed, retired.  No alcohol use.  Continues to smoke. He has two children.  Lives at [facility] but prior to January used to live at home alone.  FAMILY HISTORY:  Father deceased of myocardial infarction.  Mother 59 died of coronary artery disease.  REVIEW OF SYSTEMS:  This is as per  history of present illness, otherwise 13-point review of systems is negative.  PHYSICAL EXAMINATION:  VITAL SIGNS:  Temperature 97.6, pulse 65, respiratory rate 18, blood pressure 117/66, saturation 93% on 4 L nasal cannula.  Blood sugar is 300 mg.  GENERAL:  Elderly male looks much younger than stated age, comfortable.  NEUROLOGIC:  Alert and oriented x3.  Glasgow coma scale 15.  Moves all four extremities normally. Speech is normal.  Neurologically intact.  HEENT AND NECK:  Pupils equal and reactive to light.  Nasal cannula on.  No neck nodes.  No elevated JVP.  Upper airway, Mallampati class is class I to II.  RESPIRATORY: Trachea central, air entry equal.  Prolonged expiration.  No wheeze.  No crackles.  No  distress.  CHEST:  Mildly barrel shaped, hyperresonant, otherwise normal without any deformities.  CARDIOVASCULAR:  S1, S2 heard.  No murmurs.  Irregularly irregular.  ABDOMEN:  Soft, nontender. No organomegaly, nonobese.  EXTREMITIES:  No cyanosis, no clubbing or edema.  PERTINENT LABORATORY AND X-RAY DATA:  ABG March 6 on 4 L, pH 7.5, pCO2 of 43, pO2 of 56.  BNP March 6 is 66, and on the prior day was also less than 100.  CBC shows white count of 16.5, hemoglobin 12.  Platelet count of 218,000.  On prior day, the white count was 11,000 so today it has actually increased.  BMET shows a creatinine of 0.71 and a bicarb of 36.  Coagulation profile:  His INR on November 19, 2010, was 3.04.  Chest x-ray November 19, 2010:  Hyperinflated chest without any infiltrates.  ASSESSMENT:  Chronic obstructive pulmonary disease exacerbation with failure to improve rapidly.  I agree with the primary suspicion, that the admission diagnosis is COPD exacerbation.  I suspect that the lack of rapid improvement might be related to a rapid taper of prednisone.  He is subjectively better after changing his prednisone to IV Solu-Medrol at a much higher dose.PLAN: 1. I recommend that we do a prednisone taper starting November 21, 2010,     over a span of 2 to 3 weeks. 2. I believe that the current dose of Solu-Medrol is a little bit too     high, so I have reduced the dose. 3. We will check his spirometry in the PFT lab. 4. Continue Avelox and finish total of 5 to 6 days of treatment. 5. Will also add Spiriva to the mix at discharge, but for now he     should continue his budesonide nebs b.i.d., Atrovent nebs, I will     increase that to q.4 h. 6. I doubt he has had a pulmonary embolism __________ worsening     because he has been therapeutic on his INR. 7. Will get spirometry in the PFT lab to quantify current status of     his COPD. 8. Anticipated discharge on November 22, 2010.  Will also do PT, OT      consult. 9. Do incentive spirometry in case there is some atelectasis from him     being in bed or to help with this hypoxemia. 10.Pulmonary Critical Care will continue to follow.     Kalman Shan, MD     MR/MEDQ  D:  11/20/2010  T:  11/20/2010  Job:  [id number]  Electronically Signed by Kalman Shan MD on 11/29/2010 01:16:31 PM

## 2010-11-30 ENCOUNTER — Ambulatory Visit: Payer: Self-pay | Admitting: Cardiology

## 2010-12-02 LAB — BLOOD GAS, ARTERIAL
Bicarbonate: 17.8 mEq/L — ABNORMAL LOW (ref 20.0–24.0)
pH, Arterial: 7.247 — ABNORMAL LOW (ref 7.350–7.450)
pO2, Arterial: 320 mmHg — ABNORMAL HIGH (ref 80.0–100.0)

## 2010-12-02 LAB — CARDIAC PANEL(CRET KIN+CKTOT+MB+TROPI)
CK, MB: 26.7 ng/mL (ref 0.3–4.0)
CK, MB: 27.5 ng/mL (ref 0.3–4.0)
Relative Index: 12.7 — ABNORMAL HIGH (ref 0.0–2.5)
Relative Index: 14.6 — ABNORMAL HIGH (ref 0.0–2.5)
Troponin I: 3.67 ng/mL (ref 0.00–0.06)
Troponin I: 4.22 ng/mL (ref 0.00–0.06)

## 2010-12-02 LAB — BASIC METABOLIC PANEL
BUN: 23 mg/dL (ref 6–23)
BUN: 29 mg/dL — ABNORMAL HIGH (ref 6–23)
BUN: 23 mg/dL (ref 6–23)
BUN: 28 mg/dL — ABNORMAL HIGH (ref 6–23)
CO2: 26 mEq/L (ref 19–32)
Calcium: 8.8 mg/dL (ref 8.4–10.5)
Calcium: 8.3 mg/dL — ABNORMAL LOW (ref 8.4–10.5)
Calcium: 8.9 mg/dL (ref 8.4–10.5)
Chloride: 105 mEq/L (ref 96–112)
Chloride: 97 mEq/L (ref 96–112)
Chloride: 97 mEq/L (ref 96–112)
Chloride: 99 mEq/L (ref 96–112)
Chloride: 99 mEq/L (ref 96–112)
Creatinine, Ser: 1.06 mg/dL (ref 0.4–1.5)
Creatinine, Ser: 1.02 mg/dL (ref 0.4–1.5)
Creatinine, Ser: 1.09 mg/dL (ref 0.4–1.5)
Creatinine, Ser: 1.16 mg/dL (ref 0.4–1.5)
GFR calc Af Amer: >60 mL/min (ref >60)
GFR calc Af Amer: >60 mL/min (ref >60)
GFR calc Af Amer: >60 mL/min (ref >60)
GFR calc Af Amer: >60 mL/min (ref >60)
GFR calc Af Amer: >60 mL/min (ref >60)
GFR calc Af Amer: >60 mL/min (ref >60)
GFR calc non Af Amer: 60 mL/min — ABNORMAL LOW (ref >60)
GFR calc non Af Amer: >60 mL/min (ref >60)
GFR calc non Af Amer: >60 mL/min (ref >60)
GFR calc non Af Amer: >60 mL/min (ref >60)
GFR calc non Af Amer: >60 mL/min (ref >60)
GFR calc non Af Amer: >60 mL/min (ref >60)
GFR calc non Af Amer: >60 mL/min (ref >60)
Glucose, Bld: 123 mg/dL — ABNORMAL HIGH (ref 70–99)
Glucose, Bld: 157 mg/dL — ABNORMAL HIGH (ref 70–99)
Glucose, Bld: 169 mg/dL — ABNORMAL HIGH (ref 70–99)
Potassium: 4.3 mEq/L (ref 3.5–5.1)
Potassium: 3.5 mEq/L (ref 3.5–5.1)
Potassium: 4.3 mEq/L (ref 3.5–5.1)
Potassium: 4.3 mEq/L (ref 3.5–5.1)
Sodium: 137 mEq/L (ref 135–145)
Sodium: 133 mEq/L — ABNORMAL LOW (ref 135–145)
Sodium: 135 mEq/L (ref 135–145)
Sodium: 138 mEq/L (ref 135–145)
Sodium: 139 mEq/L (ref 135–145)

## 2010-12-02 LAB — CBC
HCT: 43.5 % (ref 39.0–52.0)
HCT: 38.9 % — ABNORMAL LOW (ref 39.0–52.0)
HCT: 41 % (ref 39.0–52.0)
Hemoglobin: 13.2 g/dL (ref 13.0–17.0)
Hemoglobin: 13.3 g/dL (ref 13.0–17.0)
MCHC: 33.8 g/dL (ref 30.0–36.0)
MCV: 94.5 fL (ref 78.0–100.0)
MCV: 94.9 fL (ref 78.0–100.0)
Platelets: 123 10*3/uL — ABNORMAL LOW (ref 150–400)
Platelets: 132 10*3/uL — ABNORMAL LOW (ref 150–400)
RBC: 4.49 MIL/uL (ref 4.22–5.81)
RBC: 4.12 MIL/uL — ABNORMAL LOW (ref 4.22–5.81)
RBC: 4.13 MIL/uL — ABNORMAL LOW (ref 4.22–5.81)
RBC: 4.2 MIL/uL — ABNORMAL LOW (ref 4.22–5.81)
RBC: 4.34 MIL/uL (ref 4.22–5.81)
RDW: 13.7 % (ref 11.5–15.5)
RDW: 13.5 % (ref 11.5–15.5)
WBC: 6.4 10*3/uL (ref 4.0–10.5)
WBC: 10.3 10*3/uL (ref 4.0–10.5)
WBC: 10.8 10*3/uL — ABNORMAL HIGH (ref 4.0–10.5)
WBC: 12 10*3/uL — ABNORMAL HIGH (ref 4.0–10.5)
WBC: 9.4 10*3/uL (ref 4.0–10.5)

## 2010-12-02 LAB — COMPREHENSIVE METABOLIC PANEL
ALT: 27 U/L (ref 0–53)
AST: 26 U/L (ref 0–37)
Alkaline Phosphatase: 67 U/L (ref 39–117)
CO2: 23 mEq/L (ref 19–32)
Calcium: 9 mg/dL (ref 8.4–10.5)
GFR calc Af Amer: >60 mL/min (ref >60)
GFR calc non Af Amer: >60 mL/min (ref >60)
Glucose, Bld: 89 mg/dL (ref 70–99)
Potassium: 4.4 mEq/L (ref 3.5–5.1)
Sodium: 136 mEq/L (ref 135–145)
Total Protein: 5.9 g/dL — ABNORMAL LOW (ref 6.0–8.3)

## 2010-12-02 LAB — URINE CULTURE: Colony Count: >=100000

## 2010-12-02 LAB — PROTIME-INR
INR: 1.06 (ref 0.00–1.49)
INR: 1.39 (ref 0.00–1.49)
INR: 1.94 — ABNORMAL HIGH (ref 0.00–1.49)
INR: 2.15 — ABNORMAL HIGH (ref 0.00–1.49)
Prothrombin Time: 13.7 seconds (ref 11.6–15.2)
Prothrombin Time: 16.9 seconds — ABNORMAL HIGH (ref 11.6–15.2)
Prothrombin Time: 21.7 seconds — ABNORMAL HIGH (ref 11.6–15.2)
Prothrombin Time: 23.8 seconds — ABNORMAL HIGH (ref 11.6–15.2)
Prothrombin Time: 27.1 seconds — ABNORMAL HIGH (ref 11.6–15.2)

## 2010-12-02 LAB — URINALYSIS, ROUTINE W REFLEX MICROSCOPIC
Bilirubin Urine: NEGATIVE
Ketones, ur: NEGATIVE mg/dL
Nitrite: NEGATIVE
Protein, ur: NEGATIVE mg/dL
Specific Gravity, Urine: 1.018 (ref 1.005–1.030)
Urobilinogen, UA: 1 mg/dL (ref 0.0–1.0)

## 2010-12-02 LAB — HEPARIN LEVEL (UNFRACTIONATED)
Heparin Unfractionated: 0.51 IU/mL (ref 0.30–0.70)
Heparin Unfractionated: 0.73 IU/mL — ABNORMAL HIGH (ref 0.30–0.70)

## 2010-12-02 LAB — APTT: aPTT: 25 seconds (ref 24–37)

## 2010-12-02 LAB — TROPONIN I: Troponin I: 0.07 ng/mL — ABNORMAL HIGH (ref 0.00–0.06)

## 2010-12-02 LAB — BRAIN NATRIURETIC PEPTIDE
Pro B Natriuretic peptide (BNP): 384 pg/mL — ABNORMAL HIGH (ref 0.0–100.0)
Pro B Natriuretic peptide (BNP): 527 pg/mL — ABNORMAL HIGH (ref 0.0–100.0)
Pro B Natriuretic peptide (BNP): 612 pg/mL — ABNORMAL HIGH (ref 0.0–100.0)
Pro B Natriuretic peptide (BNP): 675 pg/mL — ABNORMAL HIGH (ref 0.0–100.0)
Pro B Natriuretic peptide (BNP): 677 pg/mL — ABNORMAL HIGH (ref 0.0–100.0)

## 2010-12-02 LAB — CK TOTAL AND CKMB (NOT AT ARMC): Relative Index: INVALID (ref 0.0–2.5)

## 2010-12-02 LAB — DIFFERENTIAL
Eosinophils Absolute: 0 10*3/uL (ref 0.0–0.7)
Lymphocytes Relative: 8 % — ABNORMAL LOW (ref 12–46)
Lymphs Abs: 0.5 10*3/uL — ABNORMAL LOW (ref 0.7–4.0)
Monocytes Relative: 5 % (ref 3–12)
Neutro Abs: 5.5 10*3/uL (ref 1.7–7.7)
Neutrophils Relative %: 86 % — ABNORMAL HIGH (ref 43–77)

## 2010-12-02 LAB — DIGOXIN LEVEL: Digoxin Level: 1.1 ng/mL (ref 0.8–2.0)

## 2010-12-02 LAB — POCT CARDIAC MARKERS
CKMB, poc: 2.3 ng/mL (ref 1.0–8.0)
Myoglobin, poc: 79.1 ng/mL (ref 12–200)

## 2010-12-02 LAB — URINE MICROSCOPIC-ADD ON

## 2010-12-02 LAB — LIPID PANEL: Cholesterol: 125 mg/dL (ref 0–200)

## 2010-12-06 ENCOUNTER — Inpatient Hospital Stay (HOSPITAL_COMMUNITY)
Admission: EM | Admit: 2010-12-06 | Discharge: 2010-12-10 | DRG: 194 | Disposition: A | Discharge status: Skilled Nursing Facility | Payer: Medicare Other | Attending: Internal Medicine | Admitting: Internal Medicine

## 2010-12-06 ENCOUNTER — Emergency Department (HOSPITAL_COMMUNITY): Payer: Medicare Other

## 2010-12-06 DIAGNOSIS — J189 Pneumonia, unspecified organism: Principal | ICD-10-CM | POA: Diagnosis present

## 2010-12-06 DIAGNOSIS — I714 Abdominal aortic aneurysm, without rupture, unspecified: Secondary | ICD-10-CM | POA: Diagnosis present

## 2010-12-06 DIAGNOSIS — I10 Essential (primary) hypertension: Secondary | ICD-10-CM | POA: Diagnosis present

## 2010-12-06 DIAGNOSIS — M1990 Unspecified osteoarthritis, unspecified site: Secondary | ICD-10-CM | POA: Diagnosis present

## 2010-12-06 DIAGNOSIS — I252 Old myocardial infarction: Secondary | ICD-10-CM

## 2010-12-06 DIAGNOSIS — J4489 Other specified chronic obstructive pulmonary disease: Secondary | ICD-10-CM | POA: Diagnosis present

## 2010-12-06 DIAGNOSIS — Z7982 Long term (current) use of aspirin: Secondary | ICD-10-CM

## 2010-12-06 DIAGNOSIS — I4891 Unspecified atrial fibrillation: Secondary | ICD-10-CM | POA: Diagnosis present

## 2010-12-06 DIAGNOSIS — I509 Heart failure, unspecified: Secondary | ICD-10-CM | POA: Diagnosis present

## 2010-12-06 DIAGNOSIS — I2510 Atherosclerotic heart disease of native coronary artery without angina pectoris: Secondary | ICD-10-CM | POA: Diagnosis present

## 2010-12-06 DIAGNOSIS — E785 Hyperlipidemia, unspecified: Secondary | ICD-10-CM | POA: Diagnosis present

## 2010-12-06 DIAGNOSIS — Z96649 Presence of unspecified artificial hip joint: Secondary | ICD-10-CM

## 2010-12-06 DIAGNOSIS — I5020 Unspecified systolic (congestive) heart failure: Secondary | ICD-10-CM | POA: Diagnosis present

## 2010-12-06 DIAGNOSIS — IMO0002 Reserved for concepts with insufficient information to code with codable children: Secondary | ICD-10-CM

## 2010-12-06 DIAGNOSIS — Z794 Long term (current) use of insulin: Secondary | ICD-10-CM

## 2010-12-06 DIAGNOSIS — J449 Chronic obstructive pulmonary disease, unspecified: Secondary | ICD-10-CM | POA: Diagnosis present

## 2010-12-06 DIAGNOSIS — Z87891 Personal history of nicotine dependence: Secondary | ICD-10-CM

## 2010-12-06 HISTORY — DX: Pneumonia, unspecified organism: J18.9

## 2010-12-06 LAB — MRSA PCR SCREENING: MRSA by PCR: NEGATIVE

## 2010-12-06 LAB — POCT I-STAT, CHEM 8
BUN: 20 mg/dL (ref 6–23)
Chloride: 102 mEq/L (ref 96–112)
Creatinine, Ser: 0.7 mg/dL (ref 0.4–1.5)
Glucose, Bld: 154 mg/dL — ABNORMAL HIGH (ref 70–99)
Potassium: 4.2 mEq/L (ref 3.5–5.1)
Sodium: 135 mEq/L (ref 135–145)

## 2010-12-06 LAB — POCT CARDIAC MARKERS
CKMB, poc: 1.2 ng/mL (ref 1.0–8.0)
Myoglobin, poc: 58.7 ng/mL (ref 12–200)

## 2010-12-06 LAB — DIFFERENTIAL
Basophils Absolute: 0 10*3/uL (ref 0.0–0.1)
Eosinophils Relative: 0 % (ref 0–5)
Lymphocytes Relative: 4 % — ABNORMAL LOW (ref 12–46)
Lymphs Abs: 0.8 10*3/uL (ref 0.7–4.0)
Monocytes Absolute: 0.7 10*3/uL (ref 0.1–1.0)
Neutro Abs: 17.7 10*3/uL — ABNORMAL HIGH (ref 1.7–7.7)

## 2010-12-06 LAB — CBC
HCT: 42.1 % (ref 39.0–52.0)
Hemoglobin: 14 g/dL (ref 13.0–17.0)
MCHC: 33.3 g/dL (ref 30.0–36.0)
MCV: 91.5 fL (ref 78.0–100.0)
RDW: 14.9 % (ref 11.5–15.5)

## 2010-12-06 LAB — PROTIME-INR: INR: 1.85 — ABNORMAL HIGH (ref 0.00–1.49)

## 2010-12-06 LAB — GLUCOSE, CAPILLARY
Glucose-Capillary: 275 mg/dL — ABNORMAL HIGH (ref 70–99)
Glucose-Capillary: 176 mg/dL — ABNORMAL HIGH (ref 70–99)

## 2010-12-07 ENCOUNTER — Inpatient Hospital Stay (HOSPITAL_COMMUNITY): Payer: Medicare Other

## 2010-12-07 LAB — BRAIN NATRIURETIC PEPTIDE: Pro B Natriuretic peptide (BNP): 162 pg/mL — ABNORMAL HIGH (ref 0.0–100.0)

## 2010-12-07 LAB — GLUCOSE, CAPILLARY
Glucose-Capillary: 153 mg/dL — ABNORMAL HIGH (ref 70–99)
Glucose-Capillary: 254 mg/dL — ABNORMAL HIGH (ref 70–99)

## 2010-12-07 LAB — BASIC METABOLIC PANEL
BUN: 24 mg/dL — ABNORMAL HIGH (ref 6–23)
Calcium: 8.1 mg/dL — ABNORMAL LOW (ref 8.4–10.5)
Creatinine, Ser: 0.77 mg/dL (ref 0.4–1.5)
GFR calc Af Amer: >60 mL/min (ref >60)

## 2010-12-07 LAB — PROTIME-INR
INR: 2 — ABNORMAL HIGH (ref 0.00–1.49)
Prothrombin Time: 22.8 seconds — ABNORMAL HIGH (ref 11.6–15.2)

## 2010-12-07 LAB — CBC
MCHC: 31.8 g/dL (ref 30.0–36.0)
Platelets: 125 10*3/uL — ABNORMAL LOW (ref 150–400)
RDW: 14.9 % (ref 11.5–15.5)
WBC: 10.9 10*3/uL — ABNORMAL HIGH (ref 4.0–10.5)

## 2010-12-08 LAB — CBC
HCT: 31.9 % — ABNORMAL LOW (ref 39.0–52.0)
MCH: 29.6 pg (ref 26.0–34.0)
MCV: 92.5 fL (ref 78.0–100.0)
Platelets: 128 10*3/uL — ABNORMAL LOW (ref 150–400)
RBC: 3.45 MIL/uL — ABNORMAL LOW (ref 4.22–5.81)
RDW: 14.8 % (ref 11.5–15.5)
WBC: 8.9 10*3/uL (ref 4.0–10.5)

## 2010-12-08 LAB — BASIC METABOLIC PANEL
BUN: 17 mg/dL (ref 6–23)
CO2: 26 mEq/L (ref 19–32)
Chloride: 105 mEq/L (ref 96–112)
Creatinine, Ser: 0.61 mg/dL (ref 0.4–1.5)
Potassium: 3.8 mEq/L (ref 3.5–5.1)

## 2010-12-08 LAB — GLUCOSE, CAPILLARY
Glucose-Capillary: 123 mg/dL — ABNORMAL HIGH (ref 70–99)
Glucose-Capillary: 226 mg/dL — ABNORMAL HIGH (ref 70–99)
Glucose-Capillary: 162 mg/dL — ABNORMAL HIGH (ref 70–99)

## 2010-12-08 LAB — PROTIME-INR: INR: 2.23 — ABNORMAL HIGH (ref 0.00–1.49)

## 2010-12-09 LAB — CBC
Platelets: 135 10*3/uL — ABNORMAL LOW (ref 150–400)
RBC: 3.56 MIL/uL — ABNORMAL LOW (ref 4.22–5.81)
RDW: 14.8 % (ref 11.5–15.5)
WBC: 6.5 10*3/uL (ref 4.0–10.5)

## 2010-12-09 LAB — DIFFERENTIAL
Basophils Absolute: 0 10*3/uL (ref 0.0–0.1)
Eosinophils Absolute: 0 10*3/uL (ref 0.0–0.7)
Eosinophils Relative: 0 % (ref 0–5)
Neutrophils Relative %: 81 % — ABNORMAL HIGH (ref 43–77)

## 2010-12-09 LAB — COMPREHENSIVE METABOLIC PANEL
ALT: 38 U/L (ref 0–53)
Alkaline Phosphatase: 87 U/L (ref 39–117)
BUN: 20 mg/dL (ref 6–23)
CO2: 29 mEq/L (ref 19–32)
Calcium: 8.3 mg/dL — ABNORMAL LOW (ref 8.4–10.5)
GFR calc non Af Amer: >60 mL/min (ref >60)
Glucose, Bld: 122 mg/dL — ABNORMAL HIGH (ref 70–99)
Total Protein: 5 g/dL — ABNORMAL LOW (ref 6.0–8.3)

## 2010-12-09 LAB — GLUCOSE, CAPILLARY
Glucose-Capillary: 102 mg/dL — ABNORMAL HIGH (ref 70–99)
Glucose-Capillary: 206 mg/dL — ABNORMAL HIGH (ref 70–99)
Glucose-Capillary: 197 mg/dL — ABNORMAL HIGH (ref 70–99)

## 2010-12-09 LAB — BRAIN NATRIURETIC PEPTIDE: Pro B Natriuretic peptide (BNP): 125 pg/mL — ABNORMAL HIGH (ref 0.0–100.0)

## 2010-12-09 LAB — PROTIME-INR: Prothrombin Time: 23.7 seconds — ABNORMAL HIGH (ref 11.6–15.2)

## 2010-12-10 LAB — CBC
HCT: 32.6 % — ABNORMAL LOW (ref 39.0–52.0)
Hemoglobin: 10.7 g/dL — ABNORMAL LOW (ref 13.0–17.0)
MCH: 30.1 pg (ref 26.0–34.0)
MCHC: 32.8 g/dL (ref 30.0–36.0)
RDW: 14.5 % (ref 11.5–15.5)

## 2010-12-10 LAB — PROTIME-INR: INR: 1.95 — ABNORMAL HIGH (ref 0.00–1.49)

## 2010-12-10 LAB — BASIC METABOLIC PANEL
BUN: 17 mg/dL (ref 6–23)
Chloride: 102 mEq/L (ref 96–112)
Creatinine, Ser: 0.58 mg/dL (ref 0.4–1.5)
Glucose, Bld: 109 mg/dL — ABNORMAL HIGH (ref 70–99)
Potassium: 3.9 mEq/L (ref 3.5–5.1)

## 2010-12-10 LAB — BRAIN NATRIURETIC PEPTIDE: Pro B Natriuretic peptide (BNP): 162 pg/mL — ABNORMAL HIGH (ref 0.0–100.0)

## 2010-12-10 LAB — GLUCOSE, CAPILLARY: Glucose-Capillary: 168 mg/dL — ABNORMAL HIGH (ref 70–99)

## 2010-12-12 ENCOUNTER — Emergency Department (HOSPITAL_COMMUNITY): Payer: Medicare Other

## 2010-12-12 ENCOUNTER — Inpatient Hospital Stay (HOSPITAL_COMMUNITY)
Admission: EM | Admit: 2010-12-12 | Discharge: 2010-12-20 | DRG: 242 | Disposition: A | Discharge status: Skilled Nursing Facility | Payer: Medicare Other | Attending: Cardiology | Admitting: Cardiology

## 2010-12-12 DIAGNOSIS — R079 Chest pain, unspecified: Secondary | ICD-10-CM

## 2010-12-12 DIAGNOSIS — I252 Old myocardial infarction: Secondary | ICD-10-CM

## 2010-12-12 DIAGNOSIS — Z794 Long term (current) use of insulin: Secondary | ICD-10-CM

## 2010-12-12 DIAGNOSIS — R791 Abnormal coagulation profile: Secondary | ICD-10-CM | POA: Diagnosis present

## 2010-12-12 DIAGNOSIS — Z7901 Long term (current) use of anticoagulants: Secondary | ICD-10-CM

## 2010-12-12 DIAGNOSIS — I495 Sick sinus syndrome: Secondary | ICD-10-CM | POA: Diagnosis not present

## 2010-12-12 DIAGNOSIS — J189 Pneumonia, unspecified organism: Secondary | ICD-10-CM | POA: Diagnosis present

## 2010-12-12 DIAGNOSIS — Z7982 Long term (current) use of aspirin: Secondary | ICD-10-CM

## 2010-12-12 DIAGNOSIS — I714 Abdominal aortic aneurysm, without rupture, unspecified: Secondary | ICD-10-CM | POA: Diagnosis present

## 2010-12-12 DIAGNOSIS — Z96649 Presence of unspecified artificial hip joint: Secondary | ICD-10-CM

## 2010-12-12 DIAGNOSIS — R0789 Other chest pain: Principal | ICD-10-CM | POA: Diagnosis present

## 2010-12-12 DIAGNOSIS — Z9861 Coronary angioplasty status: Secondary | ICD-10-CM

## 2010-12-12 DIAGNOSIS — I2510 Atherosclerotic heart disease of native coronary artery without angina pectoris: Secondary | ICD-10-CM | POA: Diagnosis present

## 2010-12-12 DIAGNOSIS — I4891 Unspecified atrial fibrillation: Secondary | ICD-10-CM | POA: Diagnosis present

## 2010-12-12 DIAGNOSIS — I10 Essential (primary) hypertension: Secondary | ICD-10-CM | POA: Diagnosis present

## 2010-12-12 DIAGNOSIS — J4489 Other specified chronic obstructive pulmonary disease: Secondary | ICD-10-CM | POA: Diagnosis present

## 2010-12-12 DIAGNOSIS — Z79899 Other long term (current) drug therapy: Secondary | ICD-10-CM

## 2010-12-12 DIAGNOSIS — J449 Chronic obstructive pulmonary disease, unspecified: Secondary | ICD-10-CM | POA: Diagnosis present

## 2010-12-12 DIAGNOSIS — E785 Hyperlipidemia, unspecified: Secondary | ICD-10-CM | POA: Diagnosis present

## 2010-12-12 LAB — BASIC METABOLIC PANEL
BUN: 20 mg/dL (ref 6–23)
CO2: 25 mEq/L (ref 19–32)
Chloride: 100 mEq/L (ref 96–112)
Creatinine, Ser: 1.12 mg/dL (ref 0.4–1.5)
Potassium: 4.1 mEq/L (ref 3.5–5.1)

## 2010-12-12 LAB — DIFFERENTIAL
Lymphocytes Relative: 9 % — ABNORMAL LOW (ref 12–46)
Lymphs Abs: 0.9 10*3/uL (ref 0.7–4.0)
Monocytes Relative: 3 % (ref 3–12)
Neutro Abs: 9.3 10*3/uL — ABNORMAL HIGH (ref 1.7–7.7)
Neutrophils Relative %: 88 % — ABNORMAL HIGH (ref 43–77)

## 2010-12-12 LAB — CBC
HCT: 40 % (ref 39.0–52.0)
Hemoglobin: 12.6 g/dL — ABNORMAL LOW (ref 13.0–17.0)
MCH: 29.6 pg (ref 26.0–34.0)
MCV: 93.9 fL (ref 78.0–100.0)
Platelets: 207 10*3/uL (ref 150–400)
RBC: 4.26 MIL/uL (ref 4.22–5.81)
WBC: 10.6 10*3/uL — ABNORMAL HIGH (ref 4.0–10.5)

## 2010-12-12 LAB — CK TOTAL AND CKMB (NOT AT ARMC): Relative Index: INVALID (ref 0.0–2.5)

## 2010-12-12 LAB — PROTIME-INR
INR: 1.46 (ref 0.00–1.49)
Prothrombin Time: 17.9 seconds — ABNORMAL HIGH (ref 11.6–15.2)

## 2010-12-12 LAB — TROPONIN I: Troponin I: 0.12 ng/mL — ABNORMAL HIGH (ref 0.00–0.06)

## 2010-12-13 LAB — CBC
HCT: 35 % — ABNORMAL LOW (ref 39.0–52.0)
MCH: 29.6 pg (ref 26.0–34.0)
MCV: 93.3 fL (ref 78.0–100.0)
RBC: 3.75 MIL/uL — ABNORMAL LOW (ref 4.22–5.81)
RDW: 15 % (ref 11.5–15.5)
WBC: 8.3 10*3/uL (ref 4.0–10.5)

## 2010-12-13 LAB — COMPREHENSIVE METABOLIC PANEL
ALT: 26 U/L (ref 0–53)
AST: 16 U/L (ref 0–37)
Calcium: 8.4 mg/dL (ref 8.4–10.5)
GFR calc Af Amer: >60 mL/min (ref >60)
Sodium: 141 mEq/L (ref 135–145)
Total Protein: 4.7 g/dL — ABNORMAL LOW (ref 6.0–8.3)

## 2010-12-13 LAB — CARDIAC PANEL(CRET KIN+CKTOT+MB+TROPI)
CK, MB: 4.1 ng/mL — ABNORMAL HIGH (ref 0.3–4.0)
Total CK: 27 U/L (ref 7–232)
Troponin I: 0.08 ng/mL — ABNORMAL HIGH (ref 0.00–0.06)
Troponin I: 0.1 ng/mL — ABNORMAL HIGH (ref 0.00–0.06)

## 2010-12-13 LAB — GLUCOSE, CAPILLARY

## 2010-12-13 LAB — PROTIME-INR: Prothrombin Time: 18.8 seconds — ABNORMAL HIGH (ref 11.6–15.2)

## 2010-12-13 LAB — TSH: TSH: 0.931 u[IU]/mL (ref 0.350–4.500)

## 2010-12-13 LAB — HEPARIN LEVEL (UNFRACTIONATED): Heparin Unfractionated: 0.29 IU/mL — ABNORMAL LOW (ref 0.30–0.70)

## 2010-12-14 ENCOUNTER — Telehealth: Payer: Self-pay | Admitting: *Deleted

## 2010-12-14 LAB — PROTIME-INR
INR: 1.75 — ABNORMAL HIGH (ref 0.00–1.49)
Prothrombin Time: 20.6 seconds — ABNORMAL HIGH (ref 11.6–15.2)

## 2010-12-14 LAB — GLUCOSE, CAPILLARY
Glucose-Capillary: 115 mg/dL — ABNORMAL HIGH (ref 70–99)
Glucose-Capillary: 299 mg/dL — ABNORMAL HIGH (ref 70–99)

## 2010-12-14 LAB — CBC
Hemoglobin: 11.2 g/dL — ABNORMAL LOW (ref 13.0–17.0)
MCH: 29.3 pg (ref 26.0–34.0)
RBC: 3.82 MIL/uL — ABNORMAL LOW (ref 4.22–5.81)

## 2010-12-14 LAB — HEPARIN LEVEL (UNFRACTIONATED): Heparin Unfractionated: <0.1 IU/mL — ABNORMAL LOW (ref 0.30–0.70)

## 2010-12-14 NOTE — Telephone Encounter (Signed)
Daughter called stating father was in rehab and wanted to know what the plan when d/c. Dr.Nahser saw pt in rounds this am and was going to D/C today but was in AFIB so told PA to keep over the week-end and reevaluate on Mon. LM for Daughter re: above

## 2010-12-15 DIAGNOSIS — I4891 Unspecified atrial fibrillation: Secondary | ICD-10-CM

## 2010-12-15 LAB — PROTIME-INR
INR: 2.3 — ABNORMAL HIGH (ref 0.00–1.49)
Prothrombin Time: 25.4 seconds — ABNORMAL HIGH (ref 11.6–15.2)

## 2010-12-15 LAB — GLUCOSE, CAPILLARY
Glucose-Capillary: 86 mg/dL (ref 70–99)
Glucose-Capillary: 241 mg/dL — ABNORMAL HIGH (ref 70–99)
Glucose-Capillary: 139 mg/dL — ABNORMAL HIGH (ref 70–99)

## 2010-12-16 HISTORY — PX: PACEMAKER INSERTION: SHX728

## 2010-12-16 LAB — GLUCOSE, CAPILLARY
Glucose-Capillary: 247 mg/dL — ABNORMAL HIGH (ref 70–99)
Glucose-Capillary: 206 mg/dL — ABNORMAL HIGH (ref 70–99)

## 2010-12-17 ENCOUNTER — Inpatient Hospital Stay: Payer: Medicare Other | Admitting: Internal Medicine

## 2010-12-17 DIAGNOSIS — I495 Sick sinus syndrome: Secondary | ICD-10-CM

## 2010-12-17 LAB — BASIC METABOLIC PANEL
BUN: 28 mg/dL — ABNORMAL HIGH (ref 6–23)
CO2: 32 mEq/L (ref 19–32)
Chloride: 97 mEq/L (ref 96–112)
Creatinine, Ser: 0.8 mg/dL (ref 0.4–1.5)

## 2010-12-17 LAB — GLUCOSE, CAPILLARY: Glucose-Capillary: 219 mg/dL — ABNORMAL HIGH (ref 70–99)

## 2010-12-17 LAB — PROTIME-INR: INR: 2.09 — ABNORMAL HIGH (ref 0.00–1.49)

## 2010-12-18 ENCOUNTER — Inpatient Hospital Stay (HOSPITAL_COMMUNITY): Payer: Medicare Other

## 2010-12-18 LAB — GLUCOSE, CAPILLARY: Glucose-Capillary: 119 mg/dL — ABNORMAL HIGH (ref 70–99)

## 2010-12-18 LAB — PROTIME-INR
INR: 1.95 — ABNORMAL HIGH (ref 0.00–1.49)
Prothrombin Time: 22.4 seconds — ABNORMAL HIGH (ref 11.6–15.2)

## 2010-12-19 LAB — PROTIME-INR: INR: 1.97 — ABNORMAL HIGH (ref 0.00–1.49)

## 2010-12-19 LAB — GLUCOSE, CAPILLARY
Glucose-Capillary: 106 mg/dL — ABNORMAL HIGH (ref 70–99)
Glucose-Capillary: 136 mg/dL — ABNORMAL HIGH (ref 70–99)

## 2010-12-19 NOTE — H&P (Addendum)
[name], [name]:  [id number]  MEDICAL RECORD NO.:  [id number]           PATIENT TYPE:  I  LOCATION:  2038                         FACILITY:  [facility]  PHYSICIAN:  Rollene Rotunda, MD, FACCDATE OF BIRTH:  1928-08-21  DATE OF ADMISSION:  12/12/2010 DATE OF DISCHARGE:                             HISTORY & PHYSICAL   PRIMARY CARDIOLOGIST:  Colleen Can. Deborah Chalk, MD  PRIMARY MEDICAL DOCTOR:  Dr. [name].  CHIEF COMPLAINT: Chest pain.  HISTORY OF PRESENT ILLNESS:  Mr. Danowski is an 77 year old gentleman with a history of coronary  artery disease, status post anterior STEMI in January 2012, chronic obstructive pulmonary disease, afib with subtherapeutic INR, & hypertension who was recently discharged in the hospital December 10, 2010 for left lower lobe healthcare community-acquired   pneumonia. He presents to the [facility] at the advice of his  nursing care facility after he reported an episode of chest pain last night. He was lying in bed and developed an episode of brief chest discomfort which he describes as his usual  reflux sensation not associated with any shortness of breath, nausea, vomiting, or palpitations. He notified the nursing staff and requested Tums, which provided prompt relief (and usually helps alleviate his pain) and he did not have any recurrence. He  states the nurse tech reported his chest pain this morning as part of morning report and outpatient labs were drawn, showing a mildly elevated troponin of 0.15 which has come down to 0.12 on a second set. He was subsequently transferred to [facility]  for further evaluation. EKG shows afib with RVR, heart rates are currently in the 110-range. He is asymptomatic and feeling much better.  PAST MEDICAL HISTORY: 1. Recent hospitalization for left lower lobe healthcare-acquired     Pneumonia December 06, 2010 to December 10, 2010, initiated on vancomycin     and Zosyn through PICC line as well as  steroids for COPD     exacerbation. 2. Severe COPD. 3. Atrial fibrillation, on Coumadin, but was subtherapeutic INR on     admission. 4. Diabetes mellitus with steroid-induced hyperglycemia. 5. CAD status post inferior stenting September 26, 2010, status post     angioplasty to the LAD, with residual diffuse coronary artery     disease, medical management. 6. Hypertension. 7. Hyperlipidemia. 8. Abdominal aortic aneurysm, follow up with Vascular Surgery. 9. Osteoarthritis, status post right total hip arthroplasty February     2012 and left total hip January 2009. 10.EF of 50-55% with akinesis with vaso meds, inferior myocardium with     grade 1 diastolic dysfunction and mild MR.  MEDICATIONS: 1. Pulmicort nebulizers b.i.d. 2. Mucinex 1 tablet b.i.d. p.r.n. 3. Sliding-scale insulin. 4. Atrovent. 5. Xopenex nebulizer q.6 hours. 6. Zosyn 3.375 mg IV q.8 h. still December 13, 2010. 7. Prednisone taper. 8. Florastor 500 mg b.i.d. 9. Vancomycin 1 g q. 12 still December 13, 2010. 10.Aspirin 81 mg daily. 11.Bisoprolol 5 mg daily. 12.Coumadin per pharmacy. 13.Zocor 20 mg nightly. 14.Diltiazem 240 mg daily. 15.Imdur 60 mg daily. 16.Lantus 10 units  subcutaneously at bedtime. 17.Maalox p.r.n. 18.Tylenol p.r.n. 19.Senna p.r.n. 20.Vitamin  D. 21.Xanax p.r.n. 22.Potassium chloride 20 mEq daily. 23.Oxycodone 5 mg q.4 h p.r.n. pain.  ALLERGIES:  MORPHINE causes hallucination.  NOVOCAIN caused syncope. ALBUTEROL caused AFib in the past.  SOCIAL HISTORY:  Mr. [name] lives at [facility] ever since his hip arthroplasty.  He is widowed, but has a daughter who is very involved in his care.  He is a former tobacco abuser and denies any alcohol use.  FAMILY HISTORY:  Positive for CAD in his mother who died at 29, father also passed away at age 55 of CAD/MI.  Multiple siblings have had cancer and cerebrovascular disease.  REVIEW OF SYSTEMS:  No fever, chills, dyspnea  with exertion, syncope, nausea, vomiting, diarrhea, red blood per rectum, melena, hematemesis. He occasionally gets palpitations, but only a few quieten. He focusses on them.  All other systems reviewed and otherwise negative except for those noted in HPI.  LABORATORY DATA:  WBC 10.6, hemoglobin 12.6, Hematocrit 40.0, platelet count 207.  Sodium 137, potassium for 4.1, chloride 100, CO2 25, glucose 364, BUN 20, creatinine 1.12.  Cardiac enzymes as an outpatient showed a negative CK and MB this morning with a troponin 0.15.  This has since come down to a troponin of 0.12 with again normal CK and MB.  RADIOLOGY.:  Chest x-ray showed chronic changes with no acute findings.  PHYSICAL EXAMINATION:  VITAL SIGNS:  Temperature 98.2,  pulse 108, respirations 16, blood pressure 111/62, pulse ox 98% on 2 liters. GENERAL:  This is a pleasant white male in no acute distress. HEENT:  Normocephalic, atraumatic with extraocular movements intact. Clear sclerae.  Nares are without discharge. NECK:  Supple without JVD. HEART:  Auscultation reveals irregularly irregular rhythm, tachycardic without obvious murmurs, rubs, gallops. LUNGS:  Sounds have decreased breath sounds throughout with expiratory quiet rhonchi and occasional inspiratory wheeze, scattered. ABDOMEN:  Soft, nontender, nondistended.  Positive bowel sounds. EXTREMITIES:  Warm and dry without edema.  He has PICC line in the right upper extremity. NEUROLOGIC:  He is alert and oriented x3.  Response questions appropriately with a normal affect.  ASSESSMENT/PLAN:  The patient was seen and examined by Dr. Antoine Poche and myself.  This is an 77 year old gentleman with a history of coronary artery disease, status post anterior STEMI in January 2012, chronic obstructive pulmonary disease, afib with subtherapeutic INR, hypertension who was recently discharged in the hospital December 10, 2010 for left lower lobe healthcare community-acquired  pneumonia  and is scheduled to be on vancomycin and Zosyn through December 13, 2010 via PICC line.  He presents to the [facility] at the advice of his nursing care facility after he reported an episode of chest pain last night, like his prior indigestion, relieved with Tums and cold water within minutes.  He went for outpatient blood work at his nursing facility today which showed a mildly elevated troponin 0.15, which is some point come down to 0.12.  EKG is nonacute with atrial fibrillation with upper rate in the 105-115 range, with some subtle ST-T changes in V5 to V6.  He has had no chest pain since, no shortness of breath, nausea, diaphoresis or syncope with this episode.  The only difference he felt is in the arms, his right arm pain with the indigestion.  At this time, we will admit the patient in the hospital and rule him out serially with cardiac enzymes.  His minimal elevation is felt uncertain significance at this time, possibly related to his recent acute infectious  issues.  He will be continued on home medicines per Dr. Antoine Poche for his atrial fibrillation.  We will cross cover him with Coumadin given a subtherapeutic INR.  The rest of his home medications will be continued, including his vancomycin, Zosyn and steroid taper.     Ronie Spies, P.A.C.   ______________________________ Rollene Rotunda, MD, [facility]    DD/MEDQ  D:  12/12/2010  T:  12/13/2010  Job:  [id number]  cc:   Colleen Can. Deborah Chalk, M.D. Dr. [name]  Electronically Signed by Ronie Spies  on 12/19/2010 02:08:19 PM Electronically Signed by Rollene Rotunda MD [facility] on 01/11/2011 11:46:09 AM

## 2010-12-20 LAB — GLUCOSE, CAPILLARY
Glucose-Capillary: 87 mg/dL (ref 70–99)
Glucose-Capillary: 305 mg/dL — ABNORMAL HIGH (ref 70–99)

## 2010-12-20 LAB — DIGOXIN LEVEL: Digoxin Level: 0.6 ng/mL — ABNORMAL LOW (ref 0.8–2.0)

## 2010-12-20 NOTE — H&P (Signed)
[name], [name]               ACCOUNT NO.:  [id number]  MEDICAL RECORD NO.:  [id number]           PATIENT TYPE:  I  LOCATION:  1S                           FACILITY:  [facility]  PHYSICIAN:  Jene Every, M.D.    DATE OF BIRTH:  1928-01-17  DATE OF ADMISSION:  10/05/2010 DATE OF DISCHARGE:                             HISTORY & PHYSICAL   DATE OF ADMISSION:  October 25, 2010.  CHIEF COMPLAINT:  Right hip pain.  HISTORY:  [name] is a well-known patient to our practice.  He has undergone multiple surgeries in the past.  As of late, he has having progressive right lower extremity pain, fairly disabling for him.  He described it being worse with activity, better with rest.  X-rays do reveal severe osteoarthritis of the right hip with collapse of the lateral femoral head.  It is felt at this time he would benefit from a total hip arthroplasty.  Due to a recent MI, Medical and Cardiology clearance obtained from Dr. Deborah Chalk and Dr. Sherryll Burger.  The patient was scheduled for the procedure.  PAST MEDICAL HISTORY:  Significant for COPD, coronary artery disease with most recent MI in January 2011, abdominal aneurysm, hypercholesteremia, history of atrial fibrillation, osteoarthritis, peripheral vascular disease.  CURRENT MEDICATIONS:  Review of current medication includes: 1. Coumadin 5 mg 1 p.o. daily. 2. Diltiazem 240 mg 1 p.o. daily. 3. Rosuvastatin 40 mg 1/2 tablet daily. 4. Digoxin 0.125 mg 1 p.o. daily. 5. Furosemide 40 mg 2 times a day. 6. Isosorbide mononitrate 60 mg 1 p.o. daily. 7. Albuterol p.r.n. 8. Mometasone Furoate inhaler p.r.n. 9. Oxycodone 5 mg 2 tablets q.4 h. 10.Bisoprolol fumarate 5 mg 1 p.o. daily.  ALLERGIES:  The patient has sensitivity to MORPHINE.  PREVIOUS SURGERIES:  Includes bilateral hand surgery, microdiskectomy in 2006, a recurrent diskectomy in 2009, right total knee arthroplasty in 74, left total hip replacement in 2009.  SOCIAL HISTORY:  The  patient is widowed.  He is retired.  Denies any alcohol consumption.  He does currently smoke a pack of cigarettes per week.  He has two children.  He does live alone.  Plan for him to go to [facility] at discharge.  Primary care physician:  Dr. Sherryll Burger.  Cardiologist:  Dr. Deborah Chalk.  FAMILY HISTORY:  Father deceased at 3 of MI.  Mother deceased at 65 of coronary artery disease.  The patient has 3 brothers and 3 sisters, all deceased from cancer and CVA.  He has 2 children, a daughter and a son.  REVIEW OF SYSTEMS:  GENERAL:  The patient denies any fever, chills, night sweats or bleeding tendencies. NEUROLOGIC:  No blurred, double vision, seizure, headache, or paralysis. RESPIRATORY:  No shortness of breath, productive cough or hemoptysis that is not his norm.  He does get short of breath on exertion but this is unchanged. CARDIOVASCULAR:  No chest pain.  History of orthopnea. GENITOURINARY:  No dysuria, hematuria or discharge. GASTROINTESTINAL:  He does have occasional constipation.  No nausea, vomiting or bloody stools. MUSCULOSKELETAL:  Pertinent as per HPI.  PHYSICAL EXAMINATION:  VITAL SIGNS:  Pulse 50, respiratory  10, BP 126/54.  Height 5 feet 9 inches, weight 175. GENERAL:  This is an elderly gentleman seen upright in moderate distress.  He walks with severe antalgic gait utilizing a walker. HEENT:  Atraumatic, normocephalic.  Pupils equal, round and reactive to light.  EOMs intact. NECK:  Supple.  No lymphadenopathy. CHEST:  Clear to auscultation bilaterally.  No rhonchi, wheezes or rales. HEART:  Regular rate and rhythm without murmurs, gallops or rubs. ABDOMEN:  Soft, nontender, nondistended.  Bowel sounds x4. SKIN:  No rashes or lesions are noted.  In regards to the hip, the patient does walk with an antalgic gait.  Again, he does have a significant pain with range of motion.  He has negative straight leg raise.  Compartments are soft.  IMPRESSION:  Degenerative joint  disease, right hip.  PLAN:  The patient will be admitted to [facility] to undergo right total hip arthroplasty.  He is advised to hold his Coumadin starting February 4.  Dictated For:  Darin Docker, MD     Roma Schanz, P.A.   ______________________________ Jene Every, M.D.    CS/MEDQ  D:  10/19/2010  T:  10/19/2010  Job:  [id number]  Electronically Signed by Roma Schanz P.A. on 12/18/2010 10:05:53 AM Electronically Signed by Jene Every M.D. on 12/20/2010 12:53:52 PM

## 2010-12-21 NOTE — Op Note (Signed)
[name], [name]               ACCOUNT NO.:  [id number]  MEDICAL RECORD NO.:  [id number]           PATIENT TYPE:  LOCATION:                                 FACILITY:  PHYSICIAN:  [name], MD         DATE OF BIRTH:  DATE OF PROCEDURE:  12/17/2010 DATE OF DISCHARGE:                              OPERATIVE REPORT   PROCEDURE PERFORMED:  Insertion of single-chamber pacemaker.  INDICATIONS:  Chronic AFib with symptomatic tachy-brady syndrome.  INTRODUCTION:  The patient is an 77 year old male with coronary artery disease and atrial fibrillation for many years.  Previously, he had been well-controlled, but over the last several days he was admitted to the hospital with shortness of breath.  His heart rate in atrial fibrillation has been as high as 150 beats a minute despite maximum medical therapy.  He has been on amiodarone, digoxin, carvedilol, and Lopressor for rate control, and his rates had remained high until last night when he had bradycardia and pauses of over 4.5 seconds and is now referred for permanent pacemaker insertion.  DESCRIPTION OF PROCEDURE:  After informed consent was obtained, the patient was taken to the diagnostic EP lab in the fasting state.  After usual preparation and draping, intravenous fentanyl and midazolam was given for sedation.  A 30 mL of lidocaine was infiltrated in the left infraclavicular region.  A 5-cm incision was carried out over this region.  Electrocautery was utilized to dissect down to the fascial plane.  The left subclavian vein was then punctured and the St. Jude 2088T 58-cm active fixation pacing lead, serial number [id number] was advanced into the right ventricle.  Mapping was carried out in the final site.  R-wave was measured about 10 mV.  The pacing impedance with lead actively fixed was 600 ohms and threshold of 1 volt at 0.4 milliseconds. A 10 volt pacing did not stimulate diaphragm, and with active fixation of the  lead, there was a large injury current.  With these satisfactory parameters, the lead was secured to subpectoralis fascia with a figure- of-eight silk suture and the sewing sleeve was secured with silk suture. Electrocautery was utilized to make subcutaneous pocket.  Antibiotic irrigation was utilized to irrigate the pocket.  Electrocautery was utilized to assure hemostasis.  The St. Jude Accent SR RF single-chamber pacemaker was connected to the pacing lead and placed back in the subcutaneous pocket.  The pocket was irrigated with antibiotic irrigation and the incision was closed with 2-0 and 3-0 Vicryl.  Benzoin and Steri-Strips were painted on the skin.  A pressure dressing was applied and the patient was returned to his room in satisfactory condition.  Of note, the patient was given Lopressor 5 mg x2 to help slow his ventricular rate.  COMPLICATIONS:  There were no immediate procedure complications.  RESULTS:  Demonstrate successful implantation of a St. Jude single- chamber pacemaker in a patient with symptomatic brady-tachy syndrome.     [name], MD     GWT/MEDQ  D:  12/17/2010  T:  12/18/2010  Job:  [id number]  cc:   Colleen Can.  Deborah Chalk, M.D.  Electronically Signed by Lewayne Bunting MD on 12/21/2010 06:49:18 AM

## 2010-12-24 NOTE — H&P (Signed)
[name], CATLIN               ACCOUNT NO.:  [id number]  MEDICAL RECORD NO.:  [id number]           PATIENT TYPE:  E  LOCATION:  WLED                         FACILITY:  [facility]  PHYSICIAN:  Rosanna Randy, MDDATE OF BIRTH:  10/20/1927  DATE OF ADMISSION:  11/13/2010 DATE OF DISCHARGE:                             HISTORY & PHYSICAL   PRIMARY CARE PHYSICIAN:  Lenon Curt. Chilton Si, M.D.  CHIEF COMPLAINT:  Shortness of breath.  HISTORY OF PRESENT ILLNESS:  Mr. [name] is an 77 year old male with a past medical history significant for hypertension; coronary artery disease with recent myocardial infarction in January 2011; history of abdominal aneurysm; hyperlipidemia; history of chronic atrial fibrillation, on Coumadin; osteoarthritis; peripheral vascular disease and a significant COPD.  The patient do not use oxygen at home, who came to the emergency department complaining of increased shortness of breath and subjective fever since Thursday at [facility] where he reside and he had an x-ray that actually show some infiltrates consistent with mild pneumonia and he was started on Levaquin as an outpatient.  After 4 days of treatment, he stopped taking antibiotics on Monday per PCP at [facility].  Patient is currently not complaining of any cough, but still had some subjective fever and severe difficulty breathing.  He is wheezing and having a lot of trouble catching his breath.  In the emergency department, a chest x-ray was done and demonstrated mild scarring at the lateral costophrenic angle, which is no new.  No focal air space disease, effusion or edema.  No pneumothorax and just changes of emphysema.  ALLERGIES:  The patient is allergic to MORPHINE and also to NOVOCAINE with the MORPHINE he had hallucinations and itching, with the NOVOCAINE, he pass out.  PAST MEDICAL HISTORY:  Significant for: 1. COPD, not using any oxygen at home. 2. Coronary artery disease. 3.  Abdominal aneurysm. 4. Hypercholesterolemia. 5. History of atrial fibrillation, on Coumadin therapy. 6. Osteoarthritis. 7. Peripheral vascular disease. 8. Hyperlipidemia. 9. Anxiety. 10.GERD.  MEDICATIONS:  Patient takes: 1. Robaxin 500 mg 1 tablet every 6 hours as needed. 2. Vitamin D 1 tablet daily. 3. Furosemide 40 mg 1 tablet twice a day. 4. Diltiazem 240 mg 1 capsule every morning. 5. Bisoprolol 5 mg 1 tablet every morning. 6. Asmanex inhaler 220 mcg inhale 2 puffs twice a day. 7. Imdur 60 mg 1 tablet daily at bedtime. 8. Crestor 40 mg half tablet daily at bedtime. 9. Coumadin 6 mg 1 tablet daily. 10.Albuterol 0.083% inhale one nebulizer three times a day for     shortness of breath. 11.Maalox 30 mL every 4 hours as needed. 12.Levaquin 750 mg 1 tablet by mouth daily. 13.Oxycodone 5 mg 1 tablet every 4 hours as needed for pain. 14.Apresoline 0.25 mg 1 tablet every 8 hours as needed.  SOCIAL HISTORY:  Patient is widowed and retired.  Denies any alcohol consumption.  He currently smokes a pack of cigarette per week he said. He has two children.  He lives at [facility].  FAMILY HISTORY:  Father, deceased 93, myocardial infarction.  Her mother passed away at 66 also coronary artery disease.  Patient had three brothers and three sisters, all of them are deceased from cancer and cerebrovascular accident.  Have two children, one daughter and one son, both of them at this point are healthy.  REVIEW OF SYSTEMS:  As per HPI, a 12-point review of systems have been made and no other new findings were found.  PHYSICAL EXAMINATION:  VITAL SIGNS:  Temperature 97.4, blood pressure 125/90, heart rate 114, respiratory rate 24, oxygen saturation 96% oxygen saturation on BiPAP. GENERAL:  Patient was sitting upright on his bed, mild respiratory distress with the BiPAP machine in place, good oxygen saturation, able to work his way through, communicating with me in half sentences due  to his respiratory distress. HEENT:  Atraumatic normocephalic.  Pupils were equal, round and reactive to light and accommodation.  Extraocular muscles intact.  No icterus and no nystagmus. NECK:  Supple.  No lymphadenopathy.  No thyromegaly. CHEST:  Decreased breath sounds bilaterally with expiratory and inspiratory wheezing.  There was no rhonchi.  No crackles. HEART:  No murmurs, no gallop, no rubs.  Regular rate. ABDOMEN:  Soft, nontender, nondistended.  Positive bowel sounds. SKIN:  No rashes or lesions were noted. NEUROLOGIC EXAMINATION:  Patient was alert, awake and oriented x3. Cranial nerves II through XII were intact.  Muscle strength, 4/5 bilaterally symmetrical secondary to poor effort.  Gait was not assessed since the patient was connected to the BiPAP.  Normal finger-to-nose. PSYCHIATRIC:  Appropriate.  ASSESSMENT AND PLAN: 1. Shortness of breath secondary to chronic obstructive pulmonary     disease exacerbation with probably or a mild component of     congestive heart failure as well.  He have elevated BNP up to 229.     At this point, we are going to start the patient on antibiotics,     prednisone and DuoNebs.  Patient is going to be admitted to the     step-down and plan is to continue with the biphasic intermittent     positive airway pressure.  We are going to get an arterial blood     gas.  He is a full code and depending his arterial blood gas status     and next course in his respiratory distress, he might require     intubation.  We are going to check urine and also blood culture     plus we are going to check flu polymerase chain reaction screening     since he had some subjective fever and with his cardiopulmonary     disease, he is at high risk for flu.  Patient is going to be     started as well on Tamiflu. 2. Atrial fibrillation with a rapid ventricular response, which was     rate controlled and he is already on Coumadin, but after receiving      nebulizers treatments, he had developed atrial fibrillation with a     heart rate up to 140s.  At this point, we are going to start the     patient on intravenous Cardizem and we are going to check for heart     rate less than 115.  We are going to discontinue the albuterol.  We     are going to start him on Xopenex.  We are going to check cardiac     enzymes.  We are going to continue Coumadin per pharmacy and we are     going to continue with his home medications. 3. Hyperlipidemia, for that, we  are going to check a fasting lipid     profile and we are going to continue Crestor. 4. Hypertension:  Plan is to continue with home medications. 5. Gastroesophageal reflux disease:  We are going to continue using     Maalox as needed. 6. Anxiety:  Plan is to use alprazolam 0.25 every 8 hours p.r.n. as     needed. 7. Tobacco abuse:  For that, we are going to add a nicotine patch and     we are going to get a social work for smoking cessation counseling.     Rosanna Randy, MD   ______________________________ Rosanna Randy, MD    CEM/MEDQ  D:  11/13/2010  T:  11/13/2010  Job:  [id number]  cc:   Lenon Curt. Chilton Si, M.D. Fax: [phone]  Electronically Signed by Vassie Loll MD on 12/24/2010 03:57:32 PM

## 2010-12-25 ENCOUNTER — Other Ambulatory Visit: Payer: Self-pay | Admitting: *Deleted

## 2010-12-25 DIAGNOSIS — Z79899 Other long term (current) drug therapy: Secondary | ICD-10-CM

## 2010-12-25 DIAGNOSIS — R0602 Shortness of breath: Secondary | ICD-10-CM

## 2010-12-25 DIAGNOSIS — E7800 Pure hypercholesterolemia, unspecified: Secondary | ICD-10-CM

## 2010-12-25 NOTE — Discharge Summary (Addendum)
NAMEGLADYS, [name]               ACCOUNT NO.:  [id number]  MEDICAL RECORD NO.:  [id number]           PATIENT TYPE:  I  LOCATION:  2038                         FACILITY:  [facility]  PHYSICIAN:  Colleen Can. Deborah Chalk, M.D.DATE OF BIRTH:  [birth year]  DATE OF ADMISSION:  12/12/2010 DATE OF DISCHARGE:  12/20/2010                              DISCHARGE SUMMARY   DISCHARGE DIAGNOSES: 1. Chest pain with mildly elevated troponin's, peak of 0.12. 2. Atrial fibrillation with tachybrady syndrome status post single     chamber [employer] pacemaker December 17, 2010.     a.     Anticoagulated with Coumadin.     b.     Focussed on rate control, amiodarone discontinued this      admission. 3. Recent left lower lobe health-care acquired pneumonia, completed     full course of vancomycin and Zosyn. 4. Severe chronic obstructive pulmonary disease. 5. Diabetes mellitus with steroid-induced hyperglycemia. 6. Coronary artery disease status post non-ST elevation myocardial     infarction September 26, 2010 status post angioplasty to the left     anterior descending with residual disease, coronary artery disease     per medical management of unstable angina. 7. Hypertension. 8. Hyperlipidemia. 9. Abdominal aortic aneurysm, followed by vascular surgery. 10.Osteoarthritis status post right total hip arthroplasty February     2012 and left total hip, January 2009. 11.Ejection fraction of 50-55% by echo as well in 2011.  HOSPITAL COURSE:  Mr. Ferrante is an 77 year old pleasant gentleman with past medical history pertinent for CAD as well as AFib.  He was recently discharged from hospital December 12, 2010 following the hospitalization for left lower lobe health-care acquired pneumonia.  He had come from skilled nursing facility.  The evening prior to admission, he states he was resting in bed when he developed a heartburn like sensation, consistent with his prior reflux symptoms.  He called for the nurse and  requested Tums for his chest pain which provided prompt relief.  He states the tech who helped him the night before reported his chest pain in the morning and he was therefore sent for outpatient labs showing a troponin of 0.15.  He was subsequently sent to ER for further evaluation and next set of enzymes showed negative CK and MB with a troponin of 0.12 that eventually trended down.  He had had no further episodes of chest pain.  His INR was noted to be subtherapeutic and was initiated on heparin.  There was no other objective evidence of ischemia.  However, he was having difficulty with keeping his heart rate down.  Initial heart rate in the ER was 110-130.  Throughout his hospitalization, attempts were made to up-titrate his rate control medicine but he began to exhibit tachybrady syndrome with bradycardia and pauses of about 4.5 seconds in the setting of alternating heart rates in the 130s-150s.  He was permanently started on amiodarone, but this did not help.  He ultimately underwent pacemaker insertion by Dr. Ladona Ridgel on December 17, 2010.  He continued to have tachycardia with heart rates up into the 130s thereafter.  After discussion with Dr.  Ladona Ridgel, Dr. Gala Romney discontinued the amiodarone and up titrated his calcium channel blocker as well as his digoxin.  The patient improved with these changes and on the day of discharge, his heart rate was 84.  Dr. Deborah Chalk has seen and examined him today feels he is stable for discharge.  DISCHARGE LABORATORY DATA:  WBC 7.2, hemoglobin 11, hematocrit 35.5, platelet count 196,000.  INR on discharge was 1.98.  Last BMET showed sodium 137, potassium 4.3, chloride 97, CO2 32, glucose 128, BUN 28, creatinine 0.80.  TSH 0.931.  STUDIES: 1. Chest x-ray December 18, 2010 showed left cardiac pacemaker with no     adverse features.  Stable right PICC line.  No acute     cardiopulmonary abnormalities. 2. Insertion of single chamber pacemaker December 17, 2010,  please see op     report.  DISCHARGE MEDICATIONS: 1. Digoxin 0.25 mg daily. 2. Nitroglycerin sublingual, continue on 0.1 mg dose. 3. Coumadin 2.5 mg.  Dr. Deborah Chalk will like the patient to take 2     tablets on Monday's and Thursday's and 1 tablet all other days. 4. Diltiazem CD 360 mg daily. 5. Aspirin 81 mg daily. 6. Bisoprolol 5 mg every morning. 7. Budesonide 0.5 mg inhaled b.i.d. 8. Crestor 20 mg at bedtime. 9. Mucinex DM 1 tablet b.i.d. p.r.n. congestion. 10.Imdur 60 mg every morning. 11.Ipratropium 0.5 mL inhaled q.6 h. 12.NovoLog insulin 20 units t.i.d. with meals. 13.Sliding scale Lantus 10 units subcutaneously at bedtime. 14.Lasix 40 mg daily with meals. 15.Maalox 30 mL by mouth q.4 h. p.r.n. heartburn. 16.Oxycodone 5 mg q.4 h. p.r.n. pain. 17.Potassium chloride 20 mEq q.a.m. 18.Prednisone 5 mg.  To finish his taper, he will take 2 tablets from     April 5 to April 7 and 1 tablet daily from April 8 to April 10. 19.Robaxin 500 mg q.6 h. p.r.n. muscle spasm. 20.Senna 2 tablets at bedtime. 21.Florastor 250 mg 2 capsules b.i.d. 22.Tylenol 500 mg q.6 h. p.r.n. pain. 23.Vitamin D3 2000 units 1 tablet daily. 24.Xanax 0.25 mg every 8 hours as needed for anxiety. 25.Xopenex nebulizer 0.63 mg inhaled q.6 h.  DISPOSITION:  Mr. Murrillo will be discharged in stable condition back to his nursing facility.  He is to increase activity slowly and was given pacemaker with appropriate discharge instruction sheet for specific instructions regarding wound care, activity, and bathing.  He is to follow a low sodium heart-healthy diabetic diet.  He will follow up with  [facility] for wound check on December 31, 2010 at 3 p.m.  He will follow up with Dante Gang, NP at Dr. Ronnald Nian office, January 04, 2011 at 10 a.m.  He is also instructed to call Dr. Alver Fisher office for an appointment within the next 1-2 weeks.  He has also been given a lab slip for the Nursing Home to  drawn an INR on December 24, 2010 with results to be faxed to Dr. Deborah Chalk.  DURATION OF DISCHARGE ENCOUNTER:  Greater than 30 minutes including physician PA time.     Dayna Dunn, P.A.C.   ______________________________ Colleen Can. Deborah Chalk, M.D.    DD/MEDQ  D:  12/20/2010  T:  12/20/2010  Job:  [id number]  cc:   Kari Baars, M.D. Colleen Can. Deborah Chalk, M.D.  Electronically Signed by Ronie Spies  on 12/25/2010 12:22:23 PM Electronically Signed by Roger Shelter M.D. on 12/26/2010 11:16:38 AM

## 2010-12-26 ENCOUNTER — Ambulatory Visit (INDEPENDENT_AMBULATORY_CARE_PROVIDER_SITE_OTHER): Payer: Medicare Other | Admitting: Cardiology

## 2010-12-26 ENCOUNTER — Encounter: Payer: Self-pay | Admitting: Cardiology

## 2010-12-26 ENCOUNTER — Ambulatory Visit (INDEPENDENT_AMBULATORY_CARE_PROVIDER_SITE_OTHER): Payer: Medicare Other | Admitting: *Deleted

## 2010-12-26 ENCOUNTER — Other Ambulatory Visit (INDEPENDENT_AMBULATORY_CARE_PROVIDER_SITE_OTHER): Payer: Medicare Other | Admitting: *Deleted

## 2010-12-26 DIAGNOSIS — R0602 Shortness of breath: Secondary | ICD-10-CM

## 2010-12-26 DIAGNOSIS — J439 Emphysema, unspecified: Secondary | ICD-10-CM | POA: Insufficient documentation

## 2010-12-26 DIAGNOSIS — Z79899 Other long term (current) drug therapy: Secondary | ICD-10-CM

## 2010-12-26 DIAGNOSIS — I4891 Unspecified atrial fibrillation: Secondary | ICD-10-CM

## 2010-12-26 DIAGNOSIS — I714 Abdominal aortic aneurysm, without rupture: Secondary | ICD-10-CM

## 2010-12-26 DIAGNOSIS — J449 Chronic obstructive pulmonary disease, unspecified: Secondary | ICD-10-CM

## 2010-12-26 DIAGNOSIS — E7800 Pure hypercholesterolemia, unspecified: Secondary | ICD-10-CM

## 2010-12-26 DIAGNOSIS — I509 Heart failure, unspecified: Secondary | ICD-10-CM

## 2010-12-26 DIAGNOSIS — E119 Type 2 diabetes mellitus without complications: Secondary | ICD-10-CM

## 2010-12-26 LAB — BASIC METABOLIC PANEL
BUN: 22 mg/dL (ref 6–23)
Chloride: 104 mEq/L (ref 96–112)
GFR: 97.29 mL/min (ref >60.00)
Potassium: 4.5 mEq/L (ref 3.5–5.1)

## 2010-12-26 LAB — HEPATIC FUNCTION PANEL
ALT: 30 U/L (ref 0–53)
Albumin: 3.2 g/dL — ABNORMAL LOW (ref 3.5–5.2)
Bilirubin, Direct: 0.1 mg/dL (ref 0.0–0.3)
Total Protein: 5.7 g/dL — ABNORMAL LOW (ref 6.0–8.3)

## 2010-12-26 LAB — LIPID PANEL
Cholesterol: 153 mg/dL (ref 0–200)
VLDL: 26 mg/dL (ref 0.0–40.0)

## 2010-12-26 LAB — POCT INR: INR: 2.6

## 2010-12-26 LAB — BRAIN NATRIURETIC PEPTIDE: Pro B Natriuretic peptide (BNP): 253.1 pg/mL — ABNORMAL HIGH (ref 0.0–100.0)

## 2010-12-26 NOTE — Progress Notes (Signed)
[name] is seen back in the office after followup visit. He initially had hip surgery in January 2012 has had multiple complications since that time but overall has been doing better. He had a left lower lobe healthcare associated pneumonia acquired in the skilled nursing facility. He has severe underlying COPD labile stage IV, he has chronic atrial fibrillation on Coumadin anticoagulation, he has a history of diabetes, he's had previous coronary artery disease with inferior ST elevation MI on September 26, 2010 with previous angioplasty of the left anterior descending, he's had congestive heart fair with ejection fraction 25% on September 26, 2010, he has a history of hypertension, hyperlipemia, abdominal aortic aneurysm followed by vascular surgery, and he did have his right total hip arthroplasty on October 28, 2010 and a left total hip arthroplasty on September 24, 2010.  Overall, [name] is feeling better. He's breathing easier and gaining some of his weight.  His current weight is 165 and has been on 16 pounds from where he was in December. He will be seeing Dr. Shelle Iron for orthopedic followup tomorrow and follow visit with Dr. [name] is pending.  He will probably remain in a nursing facility for another 10 days to 2 weeks.    No current outpatient prescriptions on file.    Allergies  Allergen Reactions  . Morphine     REACTION: Reaction not known    Patient Active Problem List  Diagnoses  . Atrial fibrillation    History  Smoking status  . Not on file  Smokeless tobacco  . Not on file    History  Alcohol Use: Not on file    No family history on file.  Review of Systems:   The patient denies any heat or cold intolerance.  No weight gain or weight loss.  The patient denies headaches or blurry vision.  There is no cough or sputum production.  The patient denies dizziness.  There is no hematuria or hematochezia.  The patient denies any muscle aches or arthritis.  The patient denies any rash.  The  patient denies frequent falling or instability.  There is no history of depression or anxiety.  All other systems were reviewed and are negative.   Physical Exam:   [name] is pleasant and alert today. He was examined in a wheelchair. Vital signs her review. Weight is 165. Lungs show prolonged expiratory phase a few scattered crackles and rhonchi at the bases. Heart shows an irregular irregular rhythm. Extremities have no edema.The head is normocephalic and atraumatic.  Pupils are equally round and reactive to light.  Sclerae nonicteric.  Conjunctiva is clear.  Oropharynx is unremarkable.  There's adequate oral airway.  Neck is supple there are no masses.  Thyroid is not enlarged.  There is no lymphadenopathy.   Chest is symmetric.   S1 and S2 are normal.    Abdomen is soft normal bowel sounds.  There is no organomegaly.  Genital and rectal deferred.  Extremities are without edema.  Peripheral pulses are adequate.  Neurologically intact.  Full range of motion.  The patient is not depressed.  Skin is warm and dry.  Assessment / Plan:

## 2010-12-26 NOTE — Assessment & Plan Note (Signed)
Diabetes is currently managed by Dr. [name]

## 2010-12-26 NOTE — Assessment & Plan Note (Signed)
He is in chronic atrial fibrillation on warfarin anticoagulation. INR is pending

## 2010-12-26 NOTE — Assessment & Plan Note (Signed)
He has a global ejection fraction of 25%. Some of this comes from an inferior ST elevation myocardial infarction in January 2012 as well as previous angioplasty of the left anterior descending during an Anterior ST segment elevation infarction with acute pulmonary edema in January 2011. We'll continue his current medications. Lab work is pending today. We'll see again in approximately one month

## 2010-12-27 ENCOUNTER — Telehealth: Payer: Self-pay | Admitting: Cardiology

## 2010-12-27 NOTE — Telephone Encounter (Signed)
Left message to call back regarding lab work.

## 2010-12-27 NOTE — Telephone Encounter (Signed)
[facility] called -Scarlette Calico, LPN was notified with lab results and gave an order for PRN Lasix

## 2010-12-27 NOTE — Telephone Encounter (Signed)
Please confirm appointments.

## 2010-12-27 NOTE — Telephone Encounter (Signed)
SOMEONE CALLED ABOUT [facility], THEY NEED TO CALL THAT INFORMATION TO THE [facility]SING [facility] AT [phone] THIS IS THE NURSING STATION

## 2010-12-31 ENCOUNTER — Ambulatory Visit (INDEPENDENT_AMBULATORY_CARE_PROVIDER_SITE_OTHER): Payer: Medicare Other | Admitting: *Deleted

## 2010-12-31 DIAGNOSIS — I4891 Unspecified atrial fibrillation: Secondary | ICD-10-CM

## 2010-12-31 NOTE — Progress Notes (Signed)
Pacer check in clinic  

## 2011-01-02 NOTE — Discharge Summary (Signed)
NAMEHAILEY, [name]               ACCOUNT NO.:  [id number]  MEDICAL RECORD NO.:  [id number]           PATIENT TYPE:  I  LOCATION:  3743                         FACILITY:  [facility]  PHYSICIAN:  Kari Baars, M.D.  DATE OF BIRTH:  April 11, 1928  DATE OF ADMISSION:  12/06/2010 DATE OF DISCHARGE:  12/10/2010                              DISCHARGE SUMMARY   DISCHARGE DIAGNOSES: 1. Left lower lobe healthcare-associated pneumonia (skilled nursing     facility acquired). 2. Chronic obstructive pulmonary disease, severe stage IV (FEV-1 0.84     liters; FEV-1 to FVC ratio of 45%). 3. Atrial fibrillation on anticoagulation. 4. Diabetes mellitus type 2 with steroid-induced hyperglycemia. 5. Coronary artery disease status post inferior ST elevation     myocardial infarction (September 26, 2010), status post angioplasty     of the left anterior descending. 6. Congestive heart failure (ejection fraction 25% in September 26, 2010). 7. Hypertension. 8. Hyperlipidemia. 9. Abdominal aortic aneurysm followed by Vascular Surgery. 10.Osteoarthritis, status post right total hip arthroplasty (October 28, 2010 by Dr. Leotis Shames) and status post left total hip     arthroplasty (September 24, 2010).  DISCHARGE MEDICATIONS: 1. Pulmicort nebulizers 0.5 mg neb b.i.d. 2. Mucinex DM 1 tablet b.i.d. p.r.n. cough. 3. NovoLog moderate intensity sliding-scale insulin q.a.c. and     nightly. 4. Atrovent nebulizers q.6 hours. 5. Xopenex 0.63 mg nebulizers q.6 hours. 6. Zosyn 3.375 g IV q.8 h through December 13, 2010 via PICC line. 7. Prednisone 30 mg daily x3 days, then 20 mg daily x3 days, then 10     mg daily x3 days, then 5 mg daily x3 days, then stop.  Note, it  he     has increasing shortness of breath, the dose of prednisone should     be increased due to his severe COPD. 8. Florastor 500 mg b.i.d. 9. Vancomycin 1 g IV q.12 h through December 13, 2010 via PICC line. 10.Aspirin 81 mg daily. 11.Bisoprolol 5 mg  daily. 12.Coumadin 2 mg daily to achieve INR of 2-3.  Check INR on December 12, 2010. 13.Crestor 20 mg nightly. 14.Diltiazem CD 240 mg daily. 15.Imdur 60 mg daily. 16.Lantus 10 units subcu daily at bedtime. 17.Lasix 40 mg daily. 18.Maalox 30 mL q.4 h p.r.n. heartburn. 19.Oxycodone 5 mg q.4 h p.r.n. pain. 20.Potassium chloride 20 mEq daily. 21.Robaxin 500 mg q.6 h p.r.n. muscle spasm. 22.Senna 2 tablets by mouth at bedtime. 23.Tylenol 500 mg q.6 h p.r.n. pain. 24.Vitamin D 2000 units daily. 25.Xanax 0.25 mg q.8 h p.r.n. anxiety.  HOSPITAL PROCEDURES: 1. Chest x-ray on December 07, 2010 revealing improving left lower lobe     opacity consistent with pneumonia.  Also noted, there is a nodular     density projecting over the left upper lobe, which were not present     on x-rays on September 26, 2010.  Recommend chest x-ray follow up to     clearance. 2. PICC line placement (December 07, 2010).  HISTORY OF PRESENT ILLNESS:  For full details please see dictated history and physical.  Briefly, [name] is an 77 year old white male with a history of severe COPD on home oxygen, atrial fibrillation on anticoagulation, and congestive heart failure (ejection fraction 25% who presented to the emergency department from [facility] with a complaint of shortness of breath.  The patient underwent a right total hip arthroplasty on October 26, 2010.  He was discharged after this procedure to [facility] for rehabilitation.  The patient was readmitted from November 13, 2010 through November 22, 2010 for acute on chronic COPD exacerbation with respiratory failure requiring BiPAP.  He improved with steroid taper and was discharged back to [facility] on November 22, 2010.  Since discharge, the patient was progressing well with rehab and was on target for discharge at the end of March.  He was slowly tapering his prednisone down to 20 mg.  However, over the 3 days prior to  admission, he noticed an increase congestion with significant increase in shortness of breath associated with purulent sputum production.  He denies any chills or fevers but did have some sweats.  He had a significant worsening on the morning of admission and was brought to the emergency department where chest x-ray showed left lower lobe pneumonia.  He was admitted for further management.  HOSPITAL COURSE:  The patient was admitted to a telemetry bed.  He was placed on vancomycin and Zosyn to cover healthcare-acquired pneumonia (skilled nursing facility acquired).  His prednisone was increased slightly, though it did not appear that he had a significant component of COPD as the cause of his acute change and respiratory status.  With these treatments and ongoing bronchodilator therapy for his COPD, his cough, shortness of breath, and sputum production improved.  He was weaned back down to his typical 2 liters of oxygen and is now feeling much better, approaching his baseline.  The patient remains deconditioned and requires rehab for ongoing right hip replacement recovery.  He is now stable for return back to [facility] to complete 7 days of IV antibiotics for his pneumonia.  His other medical issues including coronary artery disease, congestive heart failure, atrial fibrillation, and diabetes mellitus have remained stable throughout this hospitalization on his prior regimen.  His INR on the day of discharge is 1.95 on Coumadin.  At this point, the patient is stable for discharge back to [facility] to resume rehabilitation.  DISCHARGE LABS:  CBC shows a white count 5.3, hemoglobin 10.7, platelets 138.  BMET significant for sodium 135, potassium 3.9, chloride 102, bicarb 29, BUN 17, creatinine 0.6, glucose 109.  BNP 162, INR 1.95.  DISCHARGE DIET:  Cardiac prudent.  DISCHARGE INSTRUCTIONS:  He should continue physical therapy and occupational therapy at [facility].  INR should be  obtained on December 12, 2010.  He should increase dose of prednisone if he has increasing shortness of breath or wheezing and otherwise continues slow taper. Check CBG q.a.c. and nightly and cover with sliding scale insulin.  HOSPITAL FOLLOWUP:  He will continue under the care of the physicians at [facility].  Upon discharge from can in place, he should follow up with Dr. [name] at [facility] within 7-10 days to reestablish.  CODE STATUS:  He is DNR.  DISPOSITION:  [facility].  TIME AT DISCHARGE:  Activities 35 minutes.     Kari Baars, M.D.     WS/MEDQ  D:  12/10/2010  T:  12/10/2010  Job:  [id number]  cc:   Duffy Rhody  Roslynn Amble, M.D.  Electronically Signed by Lacretia Nicks. Buren Kos M.D. on 01/02/2011 02:38:00 PM

## 2011-01-02 NOTE — H&P (Signed)
[name], [name] NO.:  [id number]  MEDICAL RECORD NO.:  [id number]           PATIENT TYPE:  E  LOCATION:  [facility]                         FACILITY:  [facility]  PHYSICIAN:  Kari Baars, M.D.  DATE OF BIRTH:  11-09-1927  DATE OF ADMISSION:  12/06/2010 DATE OF DISCHARGE:                             HISTORY & PHYSICAL   CHIEF COMPLAINT:  Shortness of breath.  HISTORY OF PRESENT ILLNESS:  [name] is an 77 year old white male with a history of severe COPD on home oxygen, atrial fibrillation on anticoagulation, and congestive heart failure (ejection fraction of 25%) who presented to the emergency department from [facility] with a complaint of shortness of breath.  The patient was recently hospitalized from November 13, 2010 through November 22, 2010 for acute on chronic COPD with exacerbation resulting in respiratory failure.  He was on BiPAP briefly, but improved withsteroids.  With a rapid taper, his symptoms recurred.  He was placed on a slower taper with resolution.  He was also treated with antibiotics at that time which were completed prior to discharge.  The patient was doing much better upon discharge back to [facility] where he was undergoing rehabilitation for recent right total hip replacement on November 05, 2010.  Since discharge on November 22, 2010, the patient states his rehab has been progressing well.  He has remained primarily confined to the room due to a norovirus outbreak, but has been more ambulatory and was on target for possible discharge at the end of March.  He was continuing to taper his prednisone slowly and was down to 20 mg.  Over the past 3 days, he has had noticed increasing congestion with significant increase in shortness of breath over the past 24 hours.  He has had a cough which has been productive of some yellow sputum.  He denies any fevers or chills.  He has had some sweats.  This  morning, when he awoke he had significant increase in shortness of breath and was brought to the emergency department where chest x-ray shows left lower lobe pneumonia.  He is being admitted for further management.  REVIEW OF SYSTEMS:  All systems reviewed with the patient are negative except in the HPI.  PAST MEDICAL HISTORY: 1. Severe COPD, stage IV (FEV-1 0.84 liters, FEV to FVC ratio 45%). 2. Atrial fibrillation with recent rapid ventricular response     (Albuterol). 3. Diabetes mellitus with steroid-induced hyperglycemia. 4. Coronary artery disease status post inferior ST elevation MI     (January 2011) status post angioplasty of the left anterior     descending artery. 5. Congestive heart failure secondary to systolic dysfunction     (ejection fraction of 25% in January 2011). 6. Hypertension. 7. Hyperlipidemia. 8. Abdominal aortic aneurysm followed by Vascular Surgery. 9. Osteoarthritis status post right total hip arthroplasty (October 26, 2010) by Dr. Leotis Shames and status post left hip arthroplasty     (January 2009).  CURRENT MEDICATIONS: 1. Prednisone 20 mg p.o. daily. 2. NovoLog 5 units subcu q.a.c. 3. Lantus 10 units subcu at bedtime.  4. Atrovent nebulizers q.4 h. 5. Xopenex 0.63 nebulizers q.4 h. 6. Aspirin 81 mg daily. 7. Lasix 40 mg daily. 8. Potassium chloride 20 mEq daily. 9. Pulmicort nebulizers 0.5 mg q.12 h. 10.Crestor 20 mg at bedtime. 11.Bisoprolol 5 mg daily. 12.Diltiazem CD 240 mg daily. 13.Imdur 60 mg daily. 14.Senna S 2 tablets at bedtime p.r.n. constipation. 15.Coumadin 2 mg daily for INR of 2-3. 16.Maalox 30 mL q.4 h. p.r.n. heartburn. 17.Oxycodone 5 mg q.4 h. p.r.n. pain. 18.Robaxin 500 mg q.6 h. p.r.n. muscle spasm. 19.Tylenol 500 mg q.6 h. p.r.n. pain. 20.Xanax 0.25 mg q.8 h p.r.n. anxiety. 21.Vitamin D 2000 units daily.  ALLERGIES:  MORPHINE causes hallucinations, NOVOCAINE causes syncope, and ALBUTEROL causes a AFib with  RVR.  FAMILY HISTORY:  Significant for coronary artery disease in his father who passed away at 60.  His mother died at 65 from coronary artery disease.  He had three brothers and three sisters all of whom are deceased from cancer or cerebrovascular disease.  SOCIAL HISTORY:  He is widowed.  He lives alone, but is currently residing at [facility] after recent hip replacement.  His daughter, Beaulah Corin is very involved in his care. He has continued to smoke a pack of cigarettes per week up until the time of his nursing facility placement.  His denies alcohol use.  PHYSICAL EXAMINATION:  VITAL SIGNS:  Temperature 98.3, blood pressure 122/86 initially, 98/80 currently, pulse 103, respirations 22, ox saturation 94% on 2 liters. GENERAL:  Chronically ill gentleman with mild dyspnea. HEENT:  Pupils are equal, round, and reactive to light.  No scleral icterus.  Oropharynx is moist without erythema. NECK:  Supple without lymphadenopathy or JVD. HEART:  Irregularly irregular. LUNGS:  Left-sided rhonchi about a third of the way up.  No expiratory wheezes heard today. ABDOMEN:  Soft, nondistended, nontender with normoactive bowel sounds. EXTREMITIES:  No clubbing, cyanosis, or edema. NEUROLOGIC:  Nonfocal.  Alert and oriented x4.  LABORATORY DATA:  CBC shows a white count of 19.2, hemoglobin 14, platelets 148.  BMET significant for sodium 135, potassium 4.2, chloride 102, bicarb 24, BUN 20, creatinine 0.7, glucose 154, INR 1.85.  BNP 125, troponin less than 0.05.  STUDIES: 1. Chest x-ray shows left lower lobe opacity consistent with     pneumonia, small nodular area in the left upper lung field requires     followup. 2. EKG personally reviewed shows sinus rhythm with frequent PVCs and     nonspecific ST changes.  ASSESSMENT AND PLAN: 1. Left lower lobe pneumonia, skilled nursing facility acquired - we     will admit for IV antibiotics with Zosyn and  vancomycin.  His exam,     leukocytosis and acute symptoms are most consistent with pneumonia     with minimal component of COPD exacerbation at this time.  If he     improves with this regimen, we will transition to IV antibiotics     via PICC line to complete at the skilled nursing facility. 2. Severe chronic obstructive pulmonary disease - no evidence of     significant exacerbation at this point.  We will continue     prednisone at 40 mg daily and continue very slow taper.  We will     continue his Pulmicort, Atrovent, and Xopenex nebulizers. 3. Atrial fibrillation - he is currently in atrial fibrillation with     excellent rate control on the bisoprolol.  He does have recent  episodes of rapid ventricular response due to albuterol which will     be avoided.  We will continue on Coumadin per pharmacy protocol. 4. Coronary artery disease - we will continue his medical regimen.  No     recent ischemic symptoms. 5. Congestive heart failure secondary to systolic dysfunction     (ejection fraction of 25%) - continue current regimen.  He is not     on an ACE inhibitor per his cardiologist due to prior intolerance     related to blood pressure effects.  May want to reconsider once he     is stable. 6. Code status - he is DNR. 7. Disposition - anticipate discharge to skilled nursing facility     [facility]) to resume rehabilitation once he is stable.     Kari Baars, M.D.     WS/MEDQ  D:  12/06/2010  T:  12/06/2010  Job:  [id number]  cc:   Colleen Can. Deborah Chalk, M.D.  Electronically Signed by Lacretia Nicks. Buren Kos M.D. on 01/02/2011 02:38:05 PM

## 2011-01-02 NOTE — Discharge Summary (Signed)
[name], [name]               ACCOUNT NO.:  [id number]  MEDICAL RECORD NO.:  [id number]           PATIENT TYPE:  I  LOCATION:  1404                         FACILITY:  [facility]  PHYSICIAN:  Kari Baars, M.D.  DATE OF BIRTH:  1928/05/10  DATE OF ADMISSION:  11/13/2010 DATE OF DISCHARGE:  11/23/2010                              DISCHARGE SUMMARY   DISCHARGE DIAGNOSES: 1. Acute-on-chronic respiratory failure secondary to chronic     obstructive pulmonary disease exacerbation. 2. Severe chronic obstructive pulmonary disease, stage IV (FEV-1, 0.84     L; FEV to FVC ratio 45%). 3. Atrial fibrillation with rapid ventricular response, triggered by     albuterol. 4. Diabetes mellitus with steroid-induced hyperglycemia. 5. Coronary artery disease, status post inferior ST elevation     myocardial infarction (January 2011), status post angioplasty of     the left anterior descending artery. 6. Congestive heart failure (ejection fraction 25% in January 2011). 7. Hypertension. 8. Hyperlipidemia. 9. Abdominal aortic aneurysm, followed by Vascular Surgery. 10.Osteoarthritis, status post recent right total hip arthroplasty     (October 26, 2010, by Dr. Darrelyn Hillock), status post left hip     arthroplasty (January 2009).  DISCHARGE MEDICATIONS: 1. Aspirin 81 mg daily. 2. Pulmicort 0.5 mg nebulizers q.12 hours. 3. Furosemide 40 mg daily. 4. Lantus 10 units subcutaneous daily. 5. NovoLog sliding scale insulin q.a.c. (moderate intensity). 6. Atrovent nebulizers q.4 hours. 7. Xopenex 0.63 mg nebulizers q.4 hours (do not substitute albuterol     due to risk of atrial fibrillation with rapid ventricular     response). 8. KCl 20 mEq daily. 9. Prednisone taper 60 mg daily for 5 days, then 40 mg daily for 5     days, then 20 mg daily for 5 days, then 10 mg daily for 5 days,     then stop if stable.  Recommend increasing dose of prednisone back     to effective dose if pulmonary status declines  given the severity     of his COPD exacerbation. 10.Crestor 20 mg q.h.s. 11.Coumadin 2 mg daily to achieve INR of 2.0 to 3.0.  Check INR on     November 25, 2010, and adjust dose accordingly. 12.Bisoprolol 5 mg daily. 13.Diltiazem CD 240 mg daily. 14.Imdur 60 mg daily. 15.Maalox 30 mL q.4 hours p.r.n. heartburn. 16.Oxycodone 5 mg q.4 hours p.r.n. pain. 17.Robaxin 500 mg q.6 hours p.r.n. muscle spasm. 18.Senna 2 tablets daily at bedtime. 19.Tylenol 500 mg q.6 hours p.r.n. mild pain. 20.Vitamin D 2000 units daily. 21.Xanax 0.25 mg q.8 hours p.r.n. anxiety.  HOSPITAL PROCEDURES: 1. BiPAP 2. Spirometry - findings consistent with severe obstructive disease     with an FEV to FVC ratio of 45% and an FEV-1 of 0.84 L. 3. Chest x-ray on November 13, 2010, November 19, 2010, and November 21, 2010,     showing findings consistent with COPD with no acute cardiopulmonary     disease.  HOSPITAL CONSULTATIONS: 1. Pulmonary and Critical Care Medicine. 2. Pharmacy for Coumadin management. 3. PT/OT.  HISTORY OF PRESENT ILLNESS:  For full details, please see dictated history and physical by  Dr. Gwenlyn Perking.  Briefly, [name] is an 77- year-old white male with a history of COPD, coronary artery disease status post MI (January 2011) and atrial fibrillation on chronic anticoagulation who underwent a recent right total hip arthroplasty on November 05, 2010.  He tolerated this procedure well and was discharged to [facility] for acute rehabilitation.  The patient did well for the first week, but then developed increasing shortness of breath.  He was placed on Levaquin for possible left lower lobe pneumonia.  However, his symptoms progressed and he was sent to the emergency department for evaluation where he was found to have significant respiratory distress. He was placed on BiPAP and given antibiotics and steroids in the emergency department and the hospitalist service was called for admission.  Shortly  after their evaluation, the patient was made aware of this and the patient was transferred to my medical service.  HOSPITAL COURSE:  The patient was admitted to a step-down bed.  He was placed on BiPAP and IV steroids as well as IV Avelox.  He did receive a dose of albuterol in the emergency department and had atrial fibrillation with rapid ventricular response.  However, this improved with improvement in his respiratory status and changed to Xopenex.  The patient's condition slowly improved and he was transferred to a telemetry bed.  His IV steroids were transitioned to p.o. steroids and tapered from 60 mg to 40 mg.  Shortly after the decrease in his steroids, his respiratory status began to worsen with increasing shortness of breath and respiratory distress.  Given this decline, he was placed back on IV steroids on November 20, 2010, and Pulmonology was consulted.  Pulmonology agreed with management and ordered spirometry, which was performed with findings consistent with severe COPD with results as above.  They recommended a very slow taper over 3 weeks due to his rapid recurrence of symptoms with tapering of steroids.  They did order albuterol nebulizers q.4 hours based on his spirometry results. He received 1 dose of albuterol and again had atrial fibrillation with rapid ventricular response on November 21, 2010.  He was placed on a Cardizem drip for about 18 hours and subsequently converted back to sinus rhythm with good rate control in the high 50s and 60s.  At this point, his Cardizem drip was discontinued and he has been monitored for an additional day in the hospital.  His steroids have also been tapered back to p.o. steroids, which he is tolerating well and his condition appears to be improving.  At this point, the patient is deemed stable for return to [facility] for ongoing rehab after his recent hip replacement.  Other issues have included steroid-induced hyperglycemia.  A  hemoglobin A1c was obtained and was 6.5 consistent with diabetes.  He was started over the weekend on metformin.  However, given his depressed EF on prior cardiac catheterization of 25%, metformin will be discontinued due to the concern with heart failure.  He has remained stable otherwise from a heart failure standpoint.  His BNPs had been normal throughout most of his hospitalization.  There was a slight increase with his atrial fibrillation episode.  However, on p.o. Lasix, he has not shown any signs of congestive heart failure.  At this point, he is deemed stable for transfer to skilled nursing facility tomorrow assuming clinical stability.  DISCHARGE DIET:  Cardiac prudent.  DISCHARGE INSTRUCTIONS:  He should continue PT/OT.  CBGs should be obtained before each meal and at bedtime with sliding  scale insulin coverage per protocol.  INR should be obtained on November 25, 2010, with the dosage adjustment of his Coumadin based on these results to achieve an INR of 2 to 3.  Would recommend very slow steroid taper.  If the symptoms worsen, we would place back on IV steroids or increase the dose of his p.o. steroids.  CODE STATUS:  Code status is DNR.  He desires medical therapy, but does not desire intubation or CPR.  CONDITION ON DISCHARGE:  Improved.  DISPOSITION:  To [facility].  TIME SPENT:  Time of discharge activity is 40 minutes.     Kari Baars, M.D.     WS/MEDQ  D:  11/22/2010  T:  11/22/2010  Job:  [id number]  cc:   Colleen Can. Deborah Chalk, M.D. Fax: [phone]  Electronically Signed by Lacretia Nicks. Buren Kos M.D. on 01/02/2011 02:37:45 PM

## 2011-01-04 ENCOUNTER — Ambulatory Visit: Payer: Medicare Other | Admitting: Nurse Practitioner

## 2011-01-11 ENCOUNTER — Ambulatory Visit (INDEPENDENT_AMBULATORY_CARE_PROVIDER_SITE_OTHER): Payer: Medicare Other | Admitting: *Deleted

## 2011-01-11 ENCOUNTER — Other Ambulatory Visit: Payer: Self-pay | Admitting: Cardiology

## 2011-01-11 DIAGNOSIS — I4891 Unspecified atrial fibrillation: Secondary | ICD-10-CM

## 2011-01-11 LAB — POCT INR: INR: 1.7

## 2011-01-11 MED ORDER — WARFARIN SODIUM 5 MG PO TABS: ORAL_TABLET | ORAL | Status: DC

## 2011-01-11 NOTE — Telephone Encounter (Signed)
Returning your call. °

## 2011-01-11 NOTE — Telephone Encounter (Signed)
Called pt back with INR results from [facility].  See Anticoagulation Encounter.  [facility] faxed written orders for Coumadin instructions and recheck date.

## 2011-01-17 ENCOUNTER — Ambulatory Visit (INDEPENDENT_AMBULATORY_CARE_PROVIDER_SITE_OTHER): Payer: Medicare Other | Admitting: *Deleted

## 2011-01-17 DIAGNOSIS — I4891 Unspecified atrial fibrillation: Secondary | ICD-10-CM

## 2011-01-18 ENCOUNTER — Telehealth: Payer: Self-pay | Admitting: *Deleted

## 2011-01-18 NOTE — Telephone Encounter (Signed)
Called stating she is doing upper extremity exercises w/ pt. Wants to know what restrictions since had pacemaker put in 4/2. Should not do any exercises on side of pacer for another couple of weeks. (left her message) (physicial therapist w/[facility]- [facility]chet [phone])

## 2011-01-21 NOTE — Patient Instructions (Signed)
Pt verbalized to RN understanding of instructions.   

## 2011-01-23 ENCOUNTER — Encounter: Payer: Self-pay | Admitting: Cardiology

## 2011-01-23 ENCOUNTER — Ambulatory Visit (INDEPENDENT_AMBULATORY_CARE_PROVIDER_SITE_OTHER): Payer: Medicare Other | Admitting: Cardiology

## 2011-01-23 VITALS — BP 120/54 | HR 58 | Ht 69.0 in | Wt 173.0 lb

## 2011-01-23 DIAGNOSIS — I4891 Unspecified atrial fibrillation: Secondary | ICD-10-CM

## 2011-01-23 NOTE — Patient Instructions (Signed)
Pt verbalized to RN understanding of Coumadin instructions.

## 2011-01-23 NOTE — Progress Notes (Signed)
Subjective:   [name] is seen for followup visit. He's been getting better is getting stronger and gradually increasing his exercise. His exercise tolerance is still poor since his hospitalizations in the first part of 2012.  He is a history of atrial fibrillation, coronary artery disease, dyslipidemia, severe COPD, and peripheral vascular disease. He's maintained on chronic Coumadin. He was on amiodarone for just a short period time. He's had ongoing tobacco abuse up until January of 2011. He had an inferior myocardial infarction in 1983. He's had an anterior MI in January 2011. He had angioplasty of the left anterior descending at that time.  He has a history of LV function estimated to be 25%. He does have a small bowel aortic aneurysm at 3.0x3.2 cm.  Current Outpatient Prescriptions  Medication Sig Dispense Refill  . ALPRAZolam (XANAX) 0.25 MG tablet Take 0.25 mg by mouth 3 (three) times daily as needed.        . bisoprolol (ZEBETA) 5 MG tablet Take 5 mg by mouth daily.        . budesonide (PULMICORT) 0.5 MG/2ML nebulizer solution Take 0.5 mg by nebulization 2 (two) times daily as needed.        . Cholecalciferol (VITAMIN D3) 2000 UNITS TABS Take by mouth.        . dextromethorphan-guaiFENesin (MUCINEX DM) 30-600 MG per 12 hr tablet Take 1 tablet by mouth 2 (two) times daily as needed.        . digoxin (LANOXIN) 0.25 MG tablet Take 250 mcg by mouth daily.        Marland Kitchen diltiazem (CARDIZEM CD) 360 MG 24 hr capsule Take 360 mg by mouth daily.        . furosemide (LASIX) 40 MG tablet Take 40 mg by mouth daily.        Marland Kitchen ipratropium (ATROVENT) 0.02 % nebulizer solution Take 500 mcg by nebulization 4 (four) times daily.        . isosorbide mononitrate (IMDUR) 60 MG 24 hr tablet Take 60 mg by mouth daily.        . nitroGLYCERIN (NITROSTAT) 0.4 MG SL tablet Place 0.4 mg under the tongue every 5 (five) minutes as needed.        Marland Kitchen oxycodone (OXY-IR) 5 MG capsule Take 5 mg by mouth every 4 (four) hours as needed.         . rosuvastatin (CRESTOR) 20 MG tablet Take 20 mg by mouth daily.        . sennosides-docusate sodium (SENOKOT-S) 8.6-50 MG tablet Take 1 tablet by mouth. Two tabs po hs       . warfarin (COUMADIN) 5 MG tablet One tablet four days a week, 1/2 tablet the other three days or as directed.  30 tablet  3  . aluminum & magnesium hydroxide (MAALOX) 225-200 MG/5ML suspension Take by mouth every 6 (six) hours as needed.        Marland Kitchen aspirin 81 MG tablet Take 81 mg by mouth daily.        . insulin glargine (LANTUS) 100 UNIT/ML injection Inject 10 Units into the skin at bedtime.        . levalbuterol (XOPENEX) 0.63 MG/3ML nebulizer solution Take 1 ampule by nebulization every 6 (six) hours as needed.        . potassium chloride (K-DUR) 10 MEQ tablet Take 20 mEq by mouth daily.        . predniSONE (DELTASONE) 5 MG tablet Take 5 mg by mouth as directed.        Marland Kitchen  saccharomyces boulardii (FLORASTOR) 250 MG capsule Take 250 mg by mouth. Two capsules PO BID          Allergies  Allergen Reactions  . Morphine Itching    Patient Active Problem List  Diagnoses  . Atrial fibrillation  . CHF (congestive heart failure)  . Diabetes mellitus  . Abdominal aortic aneurysm  . COPD (chronic obstructive pulmonary disease)    History  Smoking status  . Former Smoker -- 1.5 packs/day for 50 years  . Types: Cigarettes  . Quit date: 09/02/2009  Smokeless tobacco  . Never Used  Comment: smoked for 32yrs quit for 51yrs started back & smoked for 10ys    History  Alcohol Use No    occ - alcohol    Family History  Problem Relation Age of Onset  . Heart attack Mother   . Heart attack Father     Review of Systems:   The patient denies any heat or cold intolerance.  No weight gain or weight loss.  The patient denies headaches or blurry vision.  There is no cough or sputum production.  The patient denies dizziness.  There is no hematuria or hematochezia.  The patient denies any muscle aches or arthritis.  The  patient denies any rash.  The patient denies frequent falling or instability.  There is no history of depression or anxiety.  All other systems were reviewed and are negative.   Physical Exam:   Weight is 173. Blood pressure 120/54. Heart rate is 58 and irregular.The head is normocephalic and atraumatic.  Pupils are equally round and reactive to light.  Sclerae nonicteric.  Conjunctiva is clear.  Oropharynx is unremarkable.  There's adequate oral airway.  Neck is supple there are no masses.  Thyroid is not enlarged.  There is no lymphadenopathy.  Lungs are clear.  Chest is symmetric.  Heart shows a regular rate and rhythm.  S1 and S2 are normal.  There is no murmur click or gallop.  Abdomen is soft normal bowel sounds.  There is no organomegaly.  Genital and rectal deferred.  Extremities are without edema.  Peripheral pulses are adequate.  Neurologically intact.  Full range of motion.  The patient is not depressed.  Skin is warm and dry. Assessment / Plan:

## 2011-01-23 NOTE — Assessment & Plan Note (Signed)
Overall, he's been doing reasonably well. We'll increase his warfarin today. We'll have him get a repeat INR in 2 weeks

## 2011-01-24 ENCOUNTER — Encounter: Payer: Self-pay | Admitting: Cardiology

## 2011-01-29 NOTE — Op Note (Signed)
[name], [name]               ACCOUNT NO.:  [id number]   MEDICAL RECORD NO.:  [id number]          PATIENT TYPE:  INP   LOCATION:  0001                         FACILITY:  [facility]   PHYSICIAN:  Jene Every, M.D.    DATE OF BIRTH:  10-17-1927   DATE OF PROCEDURE:  10/08/2007  DATE OF DISCHARGE:                               OPERATIVE REPORT   PREOPERATIVE DIAGNOSIS:  Degenerative joint disease left hip.   POSTOPERATIVE DIAGNOSIS:  Degenerative joint disease left hip.   PROCEDURE PERFORMED:  Left total hip arthroplasty.   ANESTHESIA:  General.   ASSISTANT:  Marlowe Kays, M.D.   COMPONENTS:  DePuy Prodigy 13.5 mm and 52 mm acetabular porous coated  cup, two cortical screws +10 acetabular liner.   BRIEF HISTORY INDICATIONS:  77 year old end-stage osteoarthrosis of the  hip indicated for replacement.  Risks and benefits discussed including  bleeding, infection, dislocation, leg length discrepancy, DVT, PE  anesthetic complication, need for revision, etc.   The patient in supine position after adequate level general anesthesia,  1 gram Kefzol, he was placed the right lateral decubitus position.  All  bony prominences well padded, hip positioner was placed, well leg was  padded.  Foley was placed.  Left hip was prepped and draped free.  The  incision over the posterior trochanter was made and the skin,  subcutaneous tissue was dissected.  Electrocautery utilized to achieve  hemostasis.  Fascia lata identified divided in line with skin incision.  A Charnley retractor was then placed, exuberant bursa was then noted.  This was excised.  Adductor tenotomy was performed, piriformis tendon  identified, detached from its origin, reflected posteriorly to protect  sciatic nerve throughout the case.  Identified the remaining external  rotators tagged reflected posteriorly.  Then  exposed the capsule to the  lesser trochanter.  We also reflected gluteus minimus.  T-shaped  capsulotomy  was performed, a capsular incision was performed as well as  removal of the labrum.  __________  was utilized to protect the soft  tissues including sciatic nerve throughout the case.  Performed anterior  capsular release off the femur.  We then dislocated the hip.  Used  oscillating saw to perform osteotomy one fingerbreadth above the lesser  trochanter.  We released the anterior capsule off the anterior aspect of  the femur.  We then used a box chisel for the starting point and then  the initiator.  Then the femoral canal was entered utilizing an 8 mm  reamer.  This was sequentially reamed to a 13 mm with good cortical  contact.  We did lateralize this throughout that.  We then broached  sequentially to a 13.5 mm seemingly the best fit.  We then placed a 12  broach, countersunk and used the calcar reamer to ream the calcar in the  appropriate plane.  This was then removed and irrigated.  Next,  attention was turned towards the acetabulum.  We placed acetabular  retractors, removed the remainder of the soft tissue.  Electrocautery  was utilized to achieve hemostasis.  The fovea was exposed.  Soft tissue  was removed.  The reamer starting at a 46 was placed into the depth of  the acetabulum.  It was medialized at the appropriate version 45 and 15  anteversion.  It was sequentially reamed to a 51 mm diameter with good  cortical contact and good bleeding bone throughout the acetabulum.  We  tried to medialize and place depth as there was somewhat of a shallow  acetabulum.  We then placed a trial acetabular component; however, this  was not engaging.  We then used a 52 porous coated Trilock cup in the  appropriate version, tacked it into place.  Took an intraoperative  radiograph and repositioned the version.  We then put a trial femur, the  +5 ball and the acetabular component in the appropriate version with  impaction.  Then the posterior liner and this was found to be  satisfactory and  stable throughout a full range.  The hip was then  redislocated, the femoral trial removed.  The acetabular liner removed.  We checked the acetabulum.  It had good bony contact throughout in the  appropriate version.  We placed two screws in the superior and posterior  superior holes after the appropriate drilling, insertion of screws, one  25 and one 30 for good contact.  Next, the liner was placed and packed  into place the 10 degree posterior lip into the 2 o'clock position  posterolaterally.  We then irrigated again the femoral canal and as we  did initially before we broached and reamed the canal to remove fat  droplets.  We inserted the porous coated 13 mm in the appropriate  version with excellent purchase.  The sleeve set on the calcar.  Excellent purchase.  We then trialed and found the +7 to be appropriate  in terms of leg length and version and stability.  We put a permanent  +7, packed it into place again retrialed full range without dislocation  good leg lengths, no pistoning, good extension, good flexion.  This was  a +9 head.  Again stable throughout the full range, good leg lengths.  Wound copiously irrigated and we used electrocautery to achieve  hemostasis.  The external rotators could not be reattached.  we  reflected them posteriorly.  The sciatic nerve was palpated and found to  be intact.  Again, wound was copiously irrigated.  The adductor tenotomy  repaired with #1 Vicryl and interrupted figure-of-eight sutures.  The  fascia lata repaired with #1 Vicryl interrupted figure-of-eight sutures.  Subcutaneous tissue reapproximated with 0 and 2-0 Vicryl simple sutures.  Skin was reapproximated with staples.  Wound was dressed sterilely.  He  was placed supine on hospital bed, abduction pillow placed.  Had good  leg lengths, good pulses and transferred to recovery room in  satisfactory condition.   The patient tolerated the procedure well.  There were no complications.    BLOOD LOSS:  750 mL.      Jene Every, M.D.  Electronically Signed     JB/MEDQ  D:  10/08/2007  T:  10/08/2007  Job:  [id number]

## 2011-01-29 NOTE — Discharge Summary (Signed)
NAMEDEQUINCY, BORN               ACCOUNT NO.:  [id number]   MEDICAL RECORD NO.:  [id number]          PATIENT TYPE:  INP   LOCATION:  1604                         FACILITY:  [facility]   PHYSICIAN:  Jene Every, M.D.    DATE OF BIRTH:  11-Aug-1928   DATE OF ADMISSION:  10/08/2007  DATE OF DISCHARGE:  10/12/2007                               DISCHARGE SUMMARY   ADMISSION DIAGNOSES:  1. Degenerative joint disease left hip.  2. History of coronary artery disease.  3. Aortic aneurysm which is stable.  4. Myocardial infarction in 1983.  5. Asthma.  6. Chronic obstructive pulmonary disease.  7. Peripheral vascular disease.   DISCHARGE DIAGNOSES:  1. Degenerative joint disease left hip.  2. History of coronary artery disease.  3. Aortic aneurysm which is stable.  4. Myocardial infarction in 1983.  5. Asthma.  6. Chronic obstructive pulmonary disease.  7. Peripheral vascular disease.  8. Status post left total hip arthroplasty, asymptomatic.  9. Postoperative anemia.  10.Ventricular bigeminy with peripheral pulse deficit causing pseudo-      bradycardia which is stable.  11.Exacerbation of chronic obstructive pulmonary disease controlled      with nebulizer treatment.   CONSULTATIONS:  1. PT/OT, case management.  2. Dr. Sherryll Burger, cardiology.   HISTORY:  Mr. Benassi is a well-known patient to our practice.  He has  since been doing well with a history of left lower extremity pain.  Patient initially underwent lumbar decompression with some release of  symptoms, but following that surgery, had persistent left groin pain.  He underwent intra-articular hip injection with good relief of his  symptoms for a short period of time with gradual recurrence.  X-rays did  reveal osteoarthritis of left hip.  Patient does not pain with internal  and external rotation of the hip.  It is felt, at this point, that he  would benefit from total hip arthroplasty.  The risks and benefits were  discussed  with the patient, as well as medical clearance obtained.  Patient does elect to proceed.   PROCEDURE:  The patient was taken to the OR on October 08, 2007, and  underwent a left total hip arthroplasty.  Surgeon:  Jene Every, M.D.  Assistant:  Marlowe Kays, M.D.  Anesthesia:  General.  Complications:  None.  Patient was transfused with one unit of packed red blood cells  intraoperatively as well as one unit in the PACU.   Preoperative lab work showed a white cell count of 7.3, hemoglobin 14.6,  hematocrit 42.7.  This was followed throughout the hospital course.  Immediately postoperatively following the second unit of packed red  blood cells, hemoglobin was 12.7, hematocrit 37.1.  Again this was  closely monitored throughout his hospital stay.  At time of discharge to  the nursing facility, hemoglobin is 9.5, hematocrit 27.4.  Preoperative  CMET showed sodium 142, potassium 5.2, slightly elevated glucose, normal  BUN and creatinine.  This was followed throughout his hospital course.  Patient's sodium did drop to a level of 3.4 postoperatively with  potassium 4.3, slightly elevated glucose of 133.  Renal  function  remained stable.  These values were followed throughout the hospital  stay.  Sodium remained slightly decreased at 133, potassium stable at  4.1.  Coagulation studies done preoperatively showed PT of 12.6, INR  0.9.  These were monitored throughout his hospital course.  Patient was  started on Coumadin for DVT prophylaxis.  At time of discharge, he was  therapeutic on his Coumadin with INR of 2.5.  Preoperative urinalysis  was clear.  Cardiac markers were benign.  CK was 250, CK-MB was 1.6,  troponin I 0.04.  BNP slightly elevated at 210.  Preoperative chest x-  ray showed chronic lung changes, no acute pulmonary findings were noted.  Preoperative hip film showed fairly advanced osteoporosis of the left  hip.  Postoperative film showed good position alignment following  left  total hip arthroplasty.  Postoperative EKG showed PVCs with ventricular  bigeminy.  This is compared to his previous EKG findings.   HOSPITAL COURSE:  The patient was admitted, taken to the OR.  He  underwent the above stated procedure.  Intraoperatively, he was  transfused one unit of packed red blood cells.  He was then transferred  to PACU where a second unit was transfused.  Hemoglobin was checked  after that which showed stable at 12.7.  Patient was placed on p.c.  analgesics as well as Coumadin for DVT prophylaxis.  He was ready for  transfer to the PACU, then to the orthopedic floor for continued  postoperative care.  Postoperatively, patient did fairly well.  Postoperative day #1, patient's pain was well controlled.  Vital signs  remained stable as well as hemoglobin as well as all lab values.  PT was  consulted.  The patient did have some shortness of breath with exertion,  a little bit more advanced than when he is at home.  Therefore, we  consulted respiratory.  He did have an episode of heart rate jumping  down to the 30s, then back up to the 70s.  Cardiology was consulted to  evaluate the patient.  It was felt he had ventricular bigeminy with  peripheral pulse deficit during his PVCs causing pseudo-bradycardia.  It  is felt that this was of no threat to the patient.  Cardiac markers were  checked, which were negative.  No change in therapy was initiated.  Also, Dr. Sherryll Burger, his primary care physician, followed up with the patient  and adjusted his inhalers accordingly to assist with his shortness of  breath.  Orthopedically, the patient did fairly well.  The labs remained  stable.  Dressing was clean, dry and intact.  Incision was clean and dry  with minimal drainage.  He did have some lower extremity swelling.  Patient continued to progress fairly well.  It was felt he was stable to  be discharged to skilled nursing facility of choice.   DISPOSITION:  Patient stable to  be discharged to [facility].  Patient will need follow-up with Dr. Shelle Iron in approximately 10-14 days  for x-ray and suture removal.   WOUND CARE:  He is to keep this area clean and dry with daily dressing  changes.  If there is no drainage, he can discontinue the dressing  changes.   ACTIVITY:  He is to be 50% weightbearing on the left lower extremity,  elevate the leg several times a day.  Would recommend he continue to use  his TED hose.  He does continue to have quite a bit of swelling into the  left lower extremity as well as his  history of peripheral vascular  disease.  He can discontinue the abduction pillow at night.  He is to  use a regular pillow between his legs along with the knee immobilizer to  present dislocation.  Total hip precautions should be initiated.   DIET:  Advance as tolerated.   DISCHARGE MEDICATIONS:  1. Iron supplementation.  2. Coumadin as dosed per pharmacy, goal INR between 2 and 3.  3. Vitamin C 500 mg daily.  4. Vicodin one to two p.o. q.4-6h. p.r.n. pain.  5. Atenolol 50 mg one p.o. q.p.m.  6. Verapamil 240 mg one p.o. q.p.m.  7. Imdur 60 mg one p.o. q.p.m.  8. Crestor 20 mg one p.o. q.p.m.  9. Singulair 10 mg one p.o. q.p.m.  10.Foradil Aerolizer 12 mcg inhaled two times a day.  11.Asmanex 220 mcg inhaled two times a day.  12.Centrum Silver daily.  13.Patient also is using albuterol inhaler.  He should use this q.6h.      scheduled and q.4h. p.r.n. as necessary.  14.Robaxin 500 mg p.o. q.6h. p.r.n. spasm.   CONDITION ON DISCHARGE:  Stable.   FINAL DIAGNOSIS:  Doing well, status post left total hip arthroplasty.      Roma Schanz, P.A.      Jene Every, M.D.  Electronically Signed    CS/MEDQ  D:  10/12/2007  T:  10/12/2007  Job:  [id number]

## 2011-01-29 NOTE — H&P (Signed)
NAMEKENNETT, [name]               ACCOUNT NO.:  [id number]   MEDICAL RECORD NO.:  [id number]          PATIENT TYPE:  INP   LOCATION:  NA                           [facility]AC[facility]   PHYSICIAN:  Jene Every, M.D.    DATE OF BIRTH:  [birth year]   DATE OF ADMISSION:  10/08/2007  DATE OF DISCHARGE:                              HISTORY & PHYSICAL   CHIEF COMPLAINT:  Left hip pain.   HISTORY:  [name] is a well-known patient to our practice.  He is  a 77 year old gentleman with a history of persistent left lower  extremity pain.  Patient has undergone lumbar decompression but  unfortunately had persistent pain to the groin.  He actually underwent  an intraarticular hip injection with initially good relief but slow  recurrence of his symptoms.  X-rays do reveal significant osteoarthritis  of the left hip.  Exam does show pain with internal and external  rotation of the hip.  It is felt at this point, due to the disabling  nature of his symptoms, that he would benefit from a total hip  arthroplasty.  The risks and benefits of this were discussed with the  patient, he does wish to proceed.   MEDICAL HISTORY:  Significant for:  1. Coronary artery disease.  2. Aortic aneurysm, stable.  3. MRI in 1983.  4. Asthma.  5. COPD.  6. Osteoarthritis.  7. Peripheral vascular disease.   CURRENT MEDICATIONS:  1. Imdur 60 mg one p.o. daily.  2. Crestor 20 mg one p.o. daily.  3. Verapamil 240 mg one p.o. daily.  4. Atenolol 50 mg one  p.o. daily.  5. Singulair 10 mg one p.o. daily.  6. Foradil Aerolizer 12 mcg b.i.d.  7. Asmanex 220 mcg twice daily.  8. Vitamin C 1000 mg daily.  9. Centrum Silver daily.  10.Ibuprofen 800 mg b.i.d.  11.Omega-3.  12.Vitamin E.  13.Osteo Bi-Flex.  14.Aspirin 81 mg daily.   ALLERGIES:  HE HAS SENSITIVITY TO MORPHINE.   PREVIOUS SURGERIES:  1. Right hand in 2002.  2. Left hand in 2004.  3. Microdiskectomy in 2006.  4. Recurrent diskectomy in 2008.  5. Right knee arthroscopy in 1972.   SOCIAL HISTORY:  1. The patient is widowed.  2. He is retired.  3. He does live alone.  4. He does smoke approximately 2 packs of cigarettes per week.  5. He does drink occasional alcohol.   PRIMARY CARE PHYSICIAN:  Martha Clan, MD.   CARDIOLOGIST:  Lady Deutscher, MD.   FAMILY HISTORY:  1. Father deceased at 57 of MI.  2. Mother deceased at 32 of coronary artery disease.  3. He has 3 brothers and 3 sisters, all deceased from cancer, CVA.  4. He has 2 children, a daughter and a son.   REVIEW OF SYSTEMS:  GENERAL:  Patient denies any fever, chills, night  sweats or bleeding tendencies.  CNS:  No blurry or double vision,  seizure, headache or paralysis.  He does not tremor at rest and also  with extreme fatigue.  RESPIRATORY:  Patient does note dyspnea on  exertion; however, this is unchanged.  No orthopnea.  CARDIOVASCULAR:  No chest pain, angina or orthopnea.  GU:  No dysuria or hematuria or  discharge.  GI:  No nausea, vomiting, diarrhea, constipation, melena or  bloody stools.  MUSCULOSKELETAL:  Pertinent in HPI.   PHYSICAL EXAM:  Pulse of 76, respiratory 12, BP 130/70.  GENERAL:  This is a well-developed gentleman sitting upright in no acute  distress.  He does have a tremor at rest in his hands only.  HEENT:  Atraumatic, normocephalic.  Pupils equal, round and reactive to  light, EOMs intact.  NECK:  Supple, no lymphadenopathy.  CHEST:  The patient does have wheezes on the right base.  HEART:  Regular rate and rhythm without murmurs, gallops or rubs.  ABDOMEN:  Soft, nontender.  SKIN:  No rashes or lesions are noted.  HIP:  Patient does have pain with range of motion, especially internal  and external rotation.  He has 1+ deep pedal pulses bilaterally.  CALVES:  Soft, nontender.  He has negative straight leg raise.   X-rays do reveal significant degenerative joint disease of the left hip.   PLAN:  The patient will be admitted to  [facility] to undergo a  left total hip arthroplasty.      Roma Schanz, P.A.      Jene Every, M.D.  Electronically Signed    CS/MEDQ  D:  10/06/2007  T:  10/06/2007  Job:  [id number]

## 2011-01-29 NOTE — Procedures (Signed)
DUPLEX ULTRASOUND OF ABDOMINAL AORTA   INDICATION:  Follow up abdominal aortic aneurysm.   HISTORY:  Diabetes:  No.  Cardiac:  MI.  Hypertension:  No.  Smoking:  Yes.  Connective Tissue Disorder:  Family History:  Sister had possible brain aneurysm.  Previous Surgery:  No.   DUPLEX EXAM:         AP (cm)                   TRANSVERSE (cm)  Proximal             2.23 cm                   2.40 cm  Mid                  3.07 cm                   3.22 cm  Distal               1.78 cm                   1.94 cm  Right Iliac          1.23 cm                   1.24 cm  Left Iliac           1.18 cm                   1.36 cm   PREVIOUS:  Date:  AP:  TRANSVERSE:   IMPRESSION:  Small infrarenal abdominal aortic aneurysm measuring 3.07  cm X 3.22 cm at its largest diameter.   ___________________________________________  Larina Earthly, M.D.   AS/MEDQ  D:  06/21/2009  T:  06/22/2009  Job:  [id number]

## 2011-01-29 NOTE — Consult Note (Signed)
NAMEPUNEET, MASONER NO.:  [id number]   MEDICAL RECORD NO.:  [id number]          PATIENT TYPE:  INP   LOCATION:  1604                         FACILITY[facility]   PHYSICIAN:  Lyn Records, M.D.   DATE OF BIRTH:  04-Jan-1928   DATE OF CONSULTATION:  DATE OF DISCHARGE:                                 CONSULTATION   REASON FOR EVALUATION:  Bradycardia noted on pulse oximetry reading.  The patient was asymptomatic based on October 09, 2007.   CONCLUSION:  1. Ventricular bigeminy with peripheral pulse deficit during PVCs      causing pseudobradycardia identified on pulse oximetry.      a.     History of PVCs.  2. Coronary atherosclerotic heart disease.      a.     Functionally occluded RCA and 70% circumflex lesion by cath       in 10/08 performed by Dr. Reyes Ivan, normal LV function was       documented at that time.  3. Left total hip replacement on October 08, 2007.  4. Hypertension.  5. Peripheral vascular disease.   RECOMMENDATIONS:  1. Continue current medical therapy.  2. Check cardiac markers to rule out postoperative infarction.  3. Check EKG in a.m.  4. Should the patient becomes symptomatic or develop true bradycardia,      we will transfer to a telemetry bed.   COMMENTS:  The patient is a 77 year old gentleman who underwent left  total hip replacement on October 08, 2007.  He is asymptomatic today.  A  concern developed when the nurse noted as this was diagnosed by the  patient's family that his oximetry heart rates were dripped down into  the 40s intermittently then quickly backup into the 80s.  The patient  states that he has had regular heart rhythm.  He denies any chest  discomfort, orthopnea, or PND.  He does have a history of exertional  dyspnea.   MEDICATIONS:  On admission to the hospital:  1. Atenolol 50 mg per day.  2. Imdur 60 mg per day.  3. Crestor 20 mg per day.  4. Verapamil 240 mg per day.  5. Singulair 10 mg per day.  6. Foradil  Aerolizer 12 mcg twice a day.  7. Asmanex twice a day.  8. Vitamin C daily.  9. Centrum Silver daily.  10.Ibuprofen 200 mg twice a day.  11.Omega-3 of 100 mg twice a day.  12.Vitamin E 1000 international units per day.  13.Osteo Bi-Flex.  14.Aspirin 81 mg per day.   ALLERGIES:  No known allergies were noted.   HABITS:  History of cigarette smoking.   PHYSICAL EXAMINATION:  GENERAL:  The patient is in no acute distress.  VITAL SIGNS:  His blood pressure is 140/70, heart rate on oximetry is  40, by auscultation it was around 80 with clear bigeminal pattern.  NECK:  No JVD is noted.  LUNGS:  Wheezes are heard on auscultation.  CARDIAC:  Again with clear bigeminal pattern.  No murmurs.  No gallops.  ABDOMEN:  Soft.  EXTREMITIES:  No edema.  LABORATORY DATA:  EKG demonstrates normal sinus rhythm.  First-degree AV  block 204 msec with occasional PVC noted.  Bigeminal rhythm is not  documented on this EKG, but it was clearly present on multiple occasions  with palpation and auscultation at the bedside.   Laboratory data are without significant abnormalities to date.  Admitting labs include potassium of 5.2 and creatinine of 0.9.  Today's  potassium is 4.3 and hemoglobin is 11.5.   DISCUSSION:  I believe any further specific measures unnecessary.  This  is a chronic problem.  He does not have a true bradycardia, but relative  bradycardia caused by ventricular bigemini at times.  Unless symptoms  develop, I would not make alterations of the patient's medical regimen,  which he has been stable from a coronary ischemic standpoint for quite  sometime.      Lyn Records, M.D.  Electronically Signed     HWS/MEDQ  D:  10/09/2007  T:  10/10/2007  Job:  [id number]   cc:   Elmore Guise., M.D.  Bimal R. Sherryll Burger, MD  Dr. Olin Pia

## 2011-01-29 NOTE — Cardiovascular Report (Signed)
NAMENAZIR, HACKER NO.:  [id number]   MEDICAL RECORD NO.:  [id number]          PATIENT TYPE:  OIB   LOCATION:  2899                         FACILITY:  [facility]   PHYSICIAN:  Elmore Guise., M.D.DATE OF BIRTH:  1928/05/19   DATE OF PROCEDURE:  DATE OF DISCHARGE:                            CARDIAC CATHETERIZATION   INDICATIONS FOR PROCEDURE:  Abnormal stress test.   HISTORY OF PRESENT ILLNESS:  [name] is a very pleasant 77-year-  old white male who presented with increased dyspnea.  He underwent  exercise stress testing which showed a reversible inferior wall defect.  He is now referred for cardiac catheterization.   DESCRIPTION OF PROCEDURE:  The patient brought to the cardiac cath lab  after appropriate informed consent.  He was prepped and draped in  sterile fashion.  Approximately 10 mL of 1% lidocaine was used for local  anesthesia.  A 5-French sheath was placed in the left femoral artery  without difficulty.  Coronary angiography, LV angiography and distal  abdominal aortic angiography was performed.  The patient tolerated the  procedure well and was transferred from the cath lab in stable  condition.   FINDINGS:  1. Left Main:  Normal.  No significant obstructive disease.  2. LAD:  Mild to moderate luminal irregularities.  No significant      obstructive disease noted.  3. D1/D2:  Both patent with mild luminal irregularities.  4. LCX:  Nondominant with long mid 70% stenosis after bifurcation of      first OM vessel, OM-2 and OM 3 are both moderate-sized vessels with      mild luminal irregularities.  Collaterals were noted filling the      PDA and PLV as well as distal RCA from the left system.  5. RCA:  Dominant, diffusely diseased, two long subtotal obstructions      are noted in the proximal to mid and in the mid to distal portion.      Right to right collaterals were noted filling those areas.  6. LV:  EF is 55%, inferior hypokinesis.   LVEDP is 23 mmHg.  7. Distal aorta showed small infrarenal aneurysm with patent right and      left iliac vessels, left common femoral was patent with mild      luminal irregularities.  Profunda femoral had long diffuse segment      of approximately 70-80% stenosis.   IMPRESSION:  1. Obstructive right coronary artery with good collateral filling in      the PDA, PLV and distal right coronary artery with left-to-right as      well as right to right collaterals.  2. Moderate obstructive LCX disease.  3. No significant left anterior descending disease noted.  4. Preserved left ventricular systolic function with an ejection      fraction of 55%.  5. Small infrarenal aneurysm with obstructive disease noted in his      left profunda femoral.   PLAN:  At this time I would recommend continued aggressive risk factor  modifications and medical therapy.      [name].  Mee Hives., M.D.  Electronically Signed     TWK/MEDQ  D:  07/14/2007  T:  07/14/2007  Job:  [id number]

## 2011-01-30 ENCOUNTER — Ambulatory Visit (INDEPENDENT_AMBULATORY_CARE_PROVIDER_SITE_OTHER): Payer: Medicare Other | Admitting: *Deleted

## 2011-01-30 ENCOUNTER — Encounter: Payer: Self-pay | Admitting: Cardiology

## 2011-01-30 ENCOUNTER — Ambulatory Visit (INDEPENDENT_AMBULATORY_CARE_PROVIDER_SITE_OTHER): Payer: Medicare Other | Admitting: Internal Medicine

## 2011-01-30 ENCOUNTER — Encounter: Payer: Self-pay | Admitting: Internal Medicine

## 2011-01-30 ENCOUNTER — Telehealth: Payer: Self-pay | Admitting: *Deleted

## 2011-01-30 VITALS — BP 124/80 | HR 62 | Temp 97.8°F | Ht 69.0 in | Wt 176.6 lb

## 2011-01-30 DIAGNOSIS — I4891 Unspecified atrial fibrillation: Secondary | ICD-10-CM

## 2011-01-30 DIAGNOSIS — J449 Chronic obstructive pulmonary disease, unspecified: Secondary | ICD-10-CM

## 2011-01-30 NOTE — Progress Notes (Signed)
  Subjective:    Patient ID: [name], male    DOB: 05-03-1928, 77 y.o.   MRN: [id number]  HPI Seen by me as in patient consult early march 2012 for AECOPD (s/p hip surgery in feb 2012) . After that repeat admission end march 2012 for LLL pna and again for NSTEMI and then again in April 2012 for AECOPD. Got very deconditioned because of all that (prior to hip surgeyr was using walker for hip pain but driving). Now living at home by self. Has home PT/OT visiting. Using walker for strength. Hardly using narcs for hip issues. Stil has class 2-3 dyspnea but no cough. COmpliant with nebs. Most recent CXR 12/18/2010 looks clear. No other active issues. Walked 3 laps x 185 feet in office on RA and did not desaturate   Review of Systems  Constitutional: Negative for fever and unexpected weight change.  HENT: Negative for ear pain, nosebleeds, congestion, sore throat, rhinorrhea, sneezing, trouble swallowing, dental problem, postnasal drip and sinus pressure.   Eyes: Negative for redness and itching.  Respiratory: Positive for cough and shortness of breath. Negative for chest tightness and wheezing.   Cardiovascular: Negative for palpitations and leg swelling.  Gastrointestinal: Negative for nausea and vomiting.  Genitourinary: Negative for dysuria.  Musculoskeletal: Negative for joint swelling.  Skin: Negative for rash.  Neurological: Negative for headaches.  Hematological: Does not bruise/bleed easily.  Psychiatric/Behavioral: Negative for dysphoric mood. The patient is not nervous/anxious.        Objective:   Physical Exam  Nursing note and vitals reviewed. Constitutional: He is oriented to person, place, and time. He appears well-developed and well-nourished. No distress.  HENT:  Head: Normocephalic and atraumatic.  Right Ear: External ear normal.  Left Ear: External ear normal.  Mouth/Throat: Oropharynx is clear and moist. No oropharyngeal exudate.  Eyes: Conjunctivae and EOM are  normal. Pupils are equal, round, and reactive to light. Right eye exhibits no discharge. Left eye exhibits no discharge. No scleral icterus.  Neck: Normal range of motion. Neck supple. No JVD present. No tracheal deviation present. No thyromegaly present.  Cardiovascular: Normal rate, regular rhythm and intact distal pulses.  Exam reveals no gallop and no friction rub.   No murmur heard. Pulmonary/Chest: Effort normal and breath sounds normal. No respiratory distress. He has no wheezes. He has no rales. He exhibits no tenderness.  Abdominal: Soft. Bowel sounds are normal. He exhibits no distension and no mass. There is no tenderness. There is no rebound and no guarding.  Musculoskeletal: Normal range of motion. He exhibits no edema and no tenderness.  Lymphadenopathy:    He has no cervical adenopathy.  Neurological: He is alert and oriented to person, place, and time. He has normal reflexes. No cranial nerve deficit. Coordination normal.  Skin: Skin is warm and dry. No rash noted. He is not diaphoretic. No erythema. No pallor.  Psychiatric: He has a normal mood and affect. His behavior is normal. Judgment and thought content normal.          Assessment & Plan:

## 2011-01-30 NOTE — Assessment & Plan Note (Addendum)
STable disease and improved (gold stage 3-4 COPD based on March 2012 spirometry)  PLAN Glad you are better My nurse will walk you with your walker for oxygen levels Please continue your nebulizer medications Please atttend pulmonary rehab for copd; my nurse will set up referral Please return in 3 months or sooner if needed Will do alpha 1 genetic test for copd at return Call us [phone] anytime for concerns  Repeat spirometry at followup

## 2011-01-30 NOTE — Patient Instructions (Addendum)
Glad you are better My nurse will walk you with your walker for oxygen levels Please continue your nebulizer medications Please atttend pulmonary rehab for copd; my nurse will set up referral Please return in 3 months or sooner if needed Will do alpha 1 genetic test for copd at return Call us [phone] anytime for concerns  Rpeat spirometry at followup

## 2011-01-30 NOTE — Telephone Encounter (Signed)
Pt calling stating he is wanting to start driving;  Per Dr. Deborah Chalk he would like him to wait 1 more month; pt was advised

## 2011-02-01 NOTE — Discharge Summary (Signed)
[name], Darin               ACCOUNT NO.:  [id number]   MEDICAL RECORD NO.:  [id number]          PATIENT TYPE:  INP   LOCATION:  1504                         FACILITY:  [facility]   PHYSICIAN:  Jene Every, M.D.    DATE OF BIRTH:  09/22/1927   DATE OF ADMISSION:  10/08/2006  DATE OF DISCHARGE:  10/10/2006                               DISCHARGE SUMMARY   ADMISSION DIAGNOSES:  1. Neurogenic claudication secondary to spinal stenosis.  2. Hypertension.  3. Hypercholesterolemia.  4. Asthma.   DISCHARGE DIAGNOSES:  1. Neurogenic claudication secondary to spinal stenosis.  2. Hypertension.  3. Hypercholesterolemia.  4. Asthma.  5. Status post lumbar decompression L3-4 on the right.   PROCEDURE:  The patient was taken to the OR, underwent lumbar  decompression L3-4 on the right.  Surgeon, Dr. Jene Every, M.D.;  assistant, Roma Schanz; anesthesia general; complications none.   CONSULTS:  PT/OT.   HISTORY:  Mr. [name] is a pleasant 77 year old gentleman with several  month history of lower extremity pain.  He was not improved with any  conservative treatment.  Studies did show severe spinal stenosis at 3-4  on the right, and it was felt at this point he would benefit from a  lumbar decompression.  The risks and benefits of this were discussed  with the patient.  He does elect to proceed.   LABORATORY DATA:  Preoperative hemoglobin was 14.4, hematocrit 42.4.  Coagulation studies were within normal range.  Routine chemistries were  obtained which showed a sodium of 137, elevated potassium at 5.6, normal  glucose and BUN and creatinine.  EKG done preoperatively showed sinus  bradycardia with a sinus arrhythmia, cannot rule out inferior infarct.  Preoperative chest x-ray showed chronic lung changes.  No acute  pulmonary findings were noted.   HOSPITAL COURSE:  The patient was admitted, taken to the OR and  underwent the above-stated procedure without difficulty.  He  was then  transferred to the PACU and then to the orthopedic floor for continued  postoperative care.  He did have some immediate postoperative issues  with urinary retention.  He was in-and-out catheterized with 900 mL of  urine return.  The patient tolerated this well.  A repeat cath was done  later with 600 mL of urine return.  The patient was monitored.  Postoperative day #1, he did have some low back soreness, what he  described as tightness in his lower extremity but denied specific pain.  He had been out of bed without significant difficulty.  He was now  voiding, was taking p.o.'s well.  At this point, the IV was Hep locked.  PT/OT was consulted.  Discharge planning was initiated.  The patient did  fairly well with physical therapy.  On postoperative day #2, he was  getting in and out bed and walking without difficulty, so it was time he  could be discharged home.  Incision remained clean and dry without  evidence of infection.   DISPOSITION:  The patient discharged home with all home health and  physical therapy needs met.   FOLLOW UP:  The patient will follow up Dr. Shelle Iron in approximately 10-14  days for suture removal.  He is to change his dressing daily.  It is  okay for him to shower after 72 hours.   ACTIVITY:  Walk as tolerated.  Use back precautions.   DISCHARGE MEDICATIONS:  1. All home medications.  2. Vicodin 1-2 p.o. q.4-6h. p.r.n. pain.   CONDITION ON DISCHARGE:  Stable.   DIET:  As tolerated.   FINAL DIAGNOSIS:  Status post lumbar decompression, L3-4.      Roma Schanz, P.A.      Jene Every, M.D.  Electronically Signed    CS/MEDQ  D:  11/06/2006  T:  11/06/2006  Job:  [id number]

## 2011-02-01 NOTE — H&P (Signed)
NAME:  [name], [name]                         ACCOUNT NO.:  [id number]   MEDICAL RECORD NO.:  [id number]                   PATIENT TYPE:  INP   LOCATION:  2022                                 FACILITY:  [facility]   PHYSICIAN:  Wilson Singer, M.D.             DATE OF BIRTH:  05-Feb-1928   DATE OF ADMISSION:  10/25/2003  DATE OF DISCHARGE:                                HISTORY & PHYSICAL   HISTORY:  This is a 77 year old smoker who gives a three to four day history  of increasing shortness of breath and cough which has been productive.  He  apparently had seen a physician in an urgent medical center who had started  him on antibiotics and had given him steroids and nebulizers.  Apparently, a  chest x-ray had been done which did not show any significant pneumonia.  He  had come to see me the following few days later, and I have given him  further steroids and nebulizer treatments and a prednisone tapering dose.  However, he continued to deteriorate, and today when I saw him in the office  he was having difficulty with breathing and it was labored breathing.  His  pulse ox on room air was 81%, as opposed to yesterday when it was 96%.  For  this reason, he has been admitted to the hospital for further inpatient  management as he has failed outpatient therapy.  He has been a long-standing  smoker who has now decided to quit smoking.  He normally takes Advair  inhaler twice a day which he may not be taking on a regular basis.   PAST MEDICAL HISTORY:  1. Coronary artery disease.  2. Hypercholesterolemia.  3. He has had bypass surgery.   MEDICATIONS:  1. Tenormin 25 mg daily.  2. Verapamil 240 mg daily.  3. Advair 250/50 one puff b.i.d.  4. Imdur 60 mg daily.  5. Zocor 80 mg daily.  6. Wellbutrin XL 150 mg daily.  7. Avelox 400 mg daily.   ALLERGIES:  MORPHINE.   SOCIAL HISTORY:  He is a widowed gentleman who continues to smoke and lives  alone.  He is retired now.   FAMILY  HISTORY:  Noncontributory.   REVIEW OF SYSTEMS:  Apart from the symptoms mentioned above, there are no  other symptoms referable to the constitutional, cardiovascular, respiratory,  neurological, hematological, rheumatologic, endocrine, or psychiatric  systems.   PHYSICAL EXAMINATION:  GENERAL:  He had received three albuterol nebulizers  in my office, and in the hospital now he is feeling somewhat improved.  Pulse ox on room air is 93%.  We are awaiting the blood gas.  He is  hemodynamically stable.  HEART:  Heart sounds are present and normal, and there is no gallop.  __________venous pressure is not raised.  EXTREMITIES:  There is some minimal pitting edema of his ankles.  LUNGS:  Lung fields show bilateral wheezing which  is tight.  He does not  have a CO2 retention flap.  NEUROLOGIC:  He is alert and oriented and not confused.  Intact with no  focal neurological signs.  ABDOMEN:  Soft and nontender.   IMPRESSION AND PLAN:  1. Chronic obstructive pulmonary disease exacerbation.  2. History of coronary artery disease.  3. History of hypercholesterolemia.   PLAN:  We will admit him to the telemetry floor.  We will monitor him  closely.  He will be given intravenous steroids and antibiotics, and we will  put him on some nebulizer treatments.  We need to make sure there is no  element of congestive heart failure, especially in the presence of a history  of ischemic heart disease.  Further recommendations will depend on the  patient's progress.                                                Wilson Singer, M.D.    Toy Baker  D:  10/25/2003  T:  10/25/2003  Job:  [id number]

## 2011-02-01 NOTE — Op Note (Signed)
[name], [name]               ACCOUNT NO.:  [id number]   MEDICAL RECORD NO.:  [id number]          PATIENT TYPE:  INP   LOCATION:  1504                         [facility]ACILITY:  [facility]   PHYSICIAN:  Jene Every, M.D.    DATE OF BIRTH:  1928-05-26   DATE OF PROCEDURE:  10/10/2006  DATE OF DISCHARGE:  10/10/2006                               OPERATIVE REPORT   ADDENDUM:   ASSISTANT:  Roma Schanz, P.A.      Jene Every, M.D.  Electronically Signed     JB/MEDQ  D:  10/15/2006  T:  10/15/2006  Job:  [id number]

## 2011-02-01 NOTE — Discharge Summary (Signed)
NAMEHARLESS, MOLINARI               ACCOUNT NO.:  [id number]   MEDICAL RECORD NO.:  [id number]          PATIENT TYPE:  INP   LOCATION:  0467                         FACILITY:  [facility]   PHYSICIAN:  Jene Every, M.D.    DATE OF BIRTH:  1927-10-26   DATE OF ADMISSION:  11/01/2004  DATE OF DISCHARGE:  11/04/2004                                 DISCHARGE SUMMARY   ADMISSION DIAGNOSES:  1.  Spinal stenosis, L4-5.  2.  Hypertension.  3.  Hypercholesterolemia.  4.  Coronary artery disease.  5.  Aortic aneurysm.   POSTOPERATIVE DIAGNOSES:  1.  Spinal stenosis, L4-5.  2.  Hypertension.  3.  Hypercholesterolemia.  4.  Coronary artery disease.  5.  Aortic aneurysm.  6.  Status post lumbar decompression, L4-5, bilaterally.   PROCEDURE:  Patient was taken to the OR on November 01, 2004 to undergo  lumbar decompression bilaterally of L4-5.   SURGEON:  Jene Every, M.D.   ASSISTANT:  Roma Schanz, PA-C.   ANESTHESIA:  General.   COMPLICATIONS:  None.   CONSULTS:  PT/OT.   HISTORY:  Mr. Longley is a 77 year old gentleman with a longstanding  history of back and lower extremity pain that has gradually gotten worse  over the past couple of months.  He had failed conservative treatment.  MRI  and CT myelogram showed spinal stenosis, L4-5 level bilaterally.  It was  felt at this time the patient would benefit from a lumbar decompression.  The risks and benefits of the surgery were discussed with the patient, and  he wishes to proceed.   LABORATORY DATA:  Preadmission labs showed a hemoglobin of 15.5, hematocrit  45.8.  These were repeated during the hospital course with a hemoglobin of  11.9 and hematocrit of 35.1, postoperatively.  Routine chemistries done  preoperatively show a sodium of 140, potassium 4.4 with a glucose of 94 with  a normal BUN and creatinine.  This was repeated postoperatively, which shows  a sodium of 135, potassium 4.5, slightly elevated glucose of  115.  Preoperative urinalysis was negative.   I do not see a preoperative EKG or chest x-ray in the chart.   HOSPITAL COURSE:  The patient was admitted and taken to the OR and underwent  the above-stated procedure without difficulties, then transferred to the  PACU and then to the orthopedic floor for continued postoperative care.  Postoperatively, the patient did very well.  He noted a decrease in his leg  symptoms.  He was voiding without difficulty.  Hemovac was discontinued on  postoperative day #1.  IV was changed to hep-lock.  Discharge planning was  initiated.   On postoperative day #2, the patient advanced well with physical therapy but  continued to need some nursing care.  The pain was well controlled with p.o.  analgesics.   On postoperative day #3, it was felt that the patient could be discharged  home with all home health therapy needs met.   DISPOSITION:  Patient discharged home on postoperative day #3.   DISCHARGE MEDICATIONS:  Include Vicodin p.r.n. pain, Robaxin p.r.n. spasms,  vitamin C 500 mg daily.   ACTIVITY:  Patient is to walk as tolerated.  No bending, twisting, or  lifting.   WOUND CARE:  Change dressing daily.  It is okay for him to shower in 72  hours.   Follow up with Dr. Shelle Iron in approximately 10 days.   DIET:  As tolerated.   CONDITION ON DISCHARGE:  Stable.   FINAL DIAGNOSIS:  Status post lateral lumbar decompression, L4-5.      CS/MEDQ  D:  11/16/2004  T:  11/16/2004  Job:  [id number]

## 2011-02-01 NOTE — H&P (Signed)
[name], [name]               ACCOUNT NO.:  [id number]   MEDICAL RECORD NO.:  [id number]          PATIENT TYPE:  INP   LOCATION:  0467                         FACILITY:  [facility]   PHYSICIAN:  Jene Every, M.D.    DATE OF BIRTH:  11-29-1927   DATE OF ADMISSION:  11/01/2004  DATE OF DISCHARGE:  11/04/2004                                HISTORY & PHYSICAL   CHIEF COMPLAINT:  Bilateral lower extremity pain.   HISTORY:  Mr. [name] is a 77 year old gentleman with a longstanding  history of back and bilateral lower extremity pain that has gotten gradually  worse over the past several months. The patient was initially treated  conservatively. MRI was obtained which did show significant spinal stenosis  on the L4-5 level.  It was felt at this point the patient would benefit from  a lumbar decompression. The risks and benefits of the surgery were discussed  with the patient and he wishes to proceed.   MEDICAL HISTORY:  Significant for hypertension, coronary artery disease,  small aortic aneurysm, asthma, BPH.   CURRENT MEDICATIONS:  1.  Atenolol 50 mg, 1 p.o. daily.  2.  Isosorbide 60 mg, 1 p.o. daily.  3.  Verapamil ER 240 mg, 1 p.o. daily.  4.  Advair 250 mcg, 1 p.o. daily.  5.  Vytoran 10/40, 1 p.o. daily.  6.  Inhaler 1 time a day.  7.  Vitamins.   ALLERGIES:  MORPHINE which causes hallucinations, NOVOCAINE which causes the  patient to faint.   SOCIAL HISTORY:  The patient lives alone, he does occasionally drink  alcohol. He smokes approximately two packs of cigarettes every couple of  weeks with occasional use of cigarettes.   PAST SURGICAL HISTORY:  1.  Cartilage repair in right knee in 1972.  2.  Hemorrhoidectomy.  3.  Anal fissure repair.  4.  Removal of cyst from the lumbar spine.   REVIEW OF SYMPTOMS:  GENERAL:  The patient denies any fever, chills, night  sweats or bleeding tendencies. CNS:  No blurred or double vision, seizure,  headache or paralysis.  RESPIRATORY:  No shortness of breath, productive  cough or hemoptysis. CARDIOVASCULAR:  No chest pain, angina or orthopnea.  GU:  No dysuria, hematuria or discharge. GI:  No nausea, vomiting, diarrhea,  constipation, melena or bloody stools. MUSCULOSKELETAL:  As pertinent to the  HPI.   PHYSICAL EXAMINATION:  As taken from the admission health and history sheet:  VITAL SIGNS:  Temperature is 97.3, heart rate 78, respiratory 16, BP 148/70.  GENERAL:  This is a well-developed, well-nourished, 77 year old gentleman in  mild distress.  HEENT:  Normocephalic, atraumatic.  Pupils equal round and reactive to  light.  EOM's intact.  NECK:  Supple, no lymphadenopathy.  CHEST:  Clear to auscultation bilaterally. No rhonchi, wheezes or rales.  BREASTS/GU:  Not examined not pertinent to HIP.  HEART:  Regular rate and rhythm without murmur, gallop or rub.  ABDOMEN:  Soft, nontender, nondistended, bowel sounds x4.  SKIN:  No rashes or lesions are noted.  EXTREMITIES:  The patient has 1+ __________ pedal pulses,  __________ 5/-5,  he has positive straight leg raise bilaterally. No evidence of DVT is noted.   IMPRESSION:  Spinal stenosis L4-5.   PLAN:  Lumbar decompression L4-5 by Jene Every, M.D.      CS/MEDQ  D:  11/16/2004  T:  11/16/2004  Job:  [id number]

## 2011-02-01 NOTE — Discharge Summary (Signed)
NAME:  [name], [name]                         ACCOUNT NO.:  [id number]   MEDICAL RECORD NO.:  [id number]                   PATIENT TYPE:  INP   LOCATION:  6707                                 FACILITY:  [facility]   PHYSICIAN:  Wilson Singer, M.D.             DATE OF BIRTH:  1928/07/02   DATE OF ADMISSION:  10/25/2003  DATE OF DISCHARGE:  10/29/2003                                 DISCHARGE SUMMARY   FINAL DIAGNOSES:  1. Exacerbation of chronic obstructive pulmonary disease.  2. History of coronary artery disease.  3. Hypercholesterolemia.   DISCHARGE MEDICATIONS:  Home medications and prednisone 40 mg once a day for  one week.   DISCHARGE INSTRUCTIONS:  Further recommendations, no smoking.   CONDITION ON DISCHARGE:  Stable.   HISTORY OF PRESENT ILLNESS:  This 77 year old man came in with exacerbation  of COPD.  Please see initial history and physical examination for initial  evaluation.   HOSPITAL COURSE:  He was treated with intravenous steroids and intravenous  antibiotics initially.  He responded well to this.  There was a questionable  pulmonary embolus in view of pleural rub that was heard but a CT scan of the  chest did not show any evidence of pulmonary embolus.  He continued to do  well on nebulizers, steroids, and antibiotics which were eventually  converted to oral antibiotics.   On October 28, 2003, he had much less wheezing.  His pulse oximetry was 98%  and there was very little wheezing in his lungs.  Intravenous steroids were  discontinued and he was converted to oral steroids.  By October 29, 2003,  he was doing extremely well and required nebulizer only once in the night.  Pulse oximetry was 97% on room air.  On this occasion, his lungs were very  clear and there was no wheezing heard.  He was therefore able to be  discharged home on oral steroids and I had written a prescription for home  nebulizer to be used as needed with albuterol.  He will follow up in  the  office with me in a week's time for further evaluation.                                                Wilson Singer, M.D.    Toy Baker  D:  11/16/2003  T:  11/17/2003  Job:  [id number]

## 2011-02-01 NOTE — Op Note (Signed)
NAME:  [name], MEMOLI                         ACCOUNT NO.:  [id number]   MEDICAL RECORD NO.:  [id number]                   PATIENT TYPE:  AMB   LOCATION:  DSC                                  FACILITY:  [facility]   PHYSICIAN:  Katy Fitch. Naaman Plummer., M.D.          DATE OF BIRTH:  [birth year]   DATE OF PROCEDURE:  12/27/2003  DATE OF DISCHARGE:                                 OPERATIVE REPORT   PREOPERATIVE DIAGNOSIS:  Chronic stenosing tenosynovitis left index and long  fingers.   POSTOPERATIVE DIAGNOSIS:  Chronic stenosing tenosynovitis left index and  long fingers.   OPERATION PERFORMED:  1. Release of left index finger A1 pulley.  2. Release of left long finger A1 pulley.   These were performed through separate incisions.   SURGEON:  Katy Fitch. Sypher, M.D.   ASSISTANT:  Jonni Sanger, P.A.   ANESTHESIA:  0.25% Marcaine and 2% lidocaine metacarpal head level block  supplemented by IV sedation.   SUPERVISING ANESTHESIOLOGIST:  Guadalupe Maple, M.D.   INDICATIONS FOR PROCEDURE:  Jakeim Sedore is a 77 year old gentleman who has  a history of chronic stenosing tenosynovitis affecting his right and left  hands.  He has had release of A1 pulleys performed at [facility] in  [place] in the past on the right.  He presented for  evaluation of locking of his left index and long fingers on the left.  Clinical examination revealed signs of chronic stenosing tenosynovitis with  marked thickening of his flexor sheaths and the A1 pulleys.  After informed  consent he is brought to the operating room at this time for release of his  left index A1 pulley and left long finger A1 pulley.   DESCRIPTION OF PROCEDURE:  Von Quintanar was brought to the operating room  and placed in supine position on the operating table.  Following light  sedation, the left arm was prepped with Betadine soap and solution and  sterilely draped.  Following exsanguination of the left arm  with an Esmarch  bandage, an arterial tourniquet on the proximal brachium was inflated to 220  mmHg.  The procedure commenced with infiltration of 0.25% Marcaine into the  path of the intended incisions overlying the A1 pulleys of the index and  long fingers.   A short incision was fashioned parallel to the distal palmar crease exposing  the flexor sheath of the long finger.  Subcutaneous tissues were carefully  divided revealing minor contracture of the palmar fascia. There was a rather  thick layer of chronic fibrotic tissue investing the flexor sheath.  This  was cleared with a Therapist, nutritional allowing visualization of the anatomy of  the A-1 pulley.  The pulley was split along its radial border to prevent  ulnar drift.  The tendons were delivered and found to be swollen but  otherwise normal.  Thereafter, full range of motion of the long finger was recovered.  Attention was then directed to the index finger where a short oblique  incision was fashioned directly over the A1 pulley.  Once again, a rather  profound degree of inflammatory tissue was noted investing the flexor  sheath.  The fascia surrounding the flexor sheath was released with scissors  followed by clearing of the inflammatory tissues with a Glorious Peach.  The A-1  pulley was released with scissors. Thereafter, full range of motion of the  index finger was recovered.  The wounds were then repaired with interrupted  sutures of 5-0 nylon.  Compressive dressing was applied with Xeroflo,  sterile gauze and Ace wrap.  There were no apparent complications.   Mr. Howes was awakened from anesthesia and transferred to the recovery  room with stable vital signs.  He was discharged with a prescription for  Vicodin 5 mg one by mouth every four to six hours as needed for pain, 20  tablets, no refills.  He will return to our office in follow-up in  approximately seven days for a dressing change, suture removal and  advancement to a  therapy program.                                               Katy Fitch. Naaman Plummer., M.D.    RVS/MEDQ  D:  12/27/2003  T:  12/28/2003  Job:  [id number]

## 2011-02-01 NOTE — Op Note (Signed)
NAMEGIANCARLO, [name]               ACCOUNT NO.:  [id number]   MEDICAL RECORD NO.:  [id number]          PATIENT TYPE:  AMB   LOCATION:  DAY                          FACILITY:  [facility]   PHYSICIAN:  Jene Every, M.D.    DATE OF BIRTH:  1928-03-24   DATE OF PROCEDURE:  DATE OF DISCHARGE:                               OPERATIVE REPORT   PREOPERATIVE DIAGNOSIS:  Stenosis.  HNP L3-4, right.   POSTOPERATIVE DIAGNOSIS:  Stenosis.  HNP L3-4, right.   PROCEDURE PERFORMED:  Central decompression, L3-4.  Foraminotomy of L3,  right. Microdiskectomy L3-4, right.   SURGEON:  Jene Every, M.D.   ANESTHESIA:  General.   HISTORY:  This is a 77 year old with an L3 radiculopathy secondary to  compressed nerve of L3, secondary to disk herniation at L3-4, confirmed  by x-ray.  Due to neurologic deficit and size of the compression, we  elected to proceed with decompression.  The risks and benefits including  bleeding, infection, damage to nerve or vascular structures, CSF leak,  fibrosis, post laminectomy disease, need for fusion in the future,  anesthetic complications, etc.   DESCRIPTION OF PROCEDURE:  The patient was placed in the supine  position.  After administration of adequate general anesthesia and 2  grams of Kefzol, the patient was placed prone on the [place] table.  All  bony prominences were well padded.  The lumbar region was prepped and  draped in the usual sterile fashion.  Two 18 gauge spinal needles were  utilized to localize the 3-4 interspace, confirmed with x-ray.  An  incision was made from the spinous processes of 3-4.  The subcutaneous  tissue was dissected with electrocautery utilized to achieve hemostasis.  The dorsolumbar fascia was identified and bilateral skin incision in the  paraspinous muscle elevated from lamina of 3 and 4.  We confirmed the 3-  4 space by x-ray.  We performed central decompression at 3-4 due to  small interlaminar space.  We removed the majority  of the spinous  process of 3 and partial of 4.  We then removed the ligamentum flavum  from the interspace, and performed hemilaminotomy of the caudad edge of  3, and out into the lateral recess of 3-4, decompressing out to the  medial border of the pedicle. We identified the nerve root of 3.  We  found a separate large foraminal disk herniation extending into the  foramen, severely compressing the 3 root.  We mobilized this with the  nerve hook and removed it with a micropituitary with three separate  small/large pieces.  We also entered at the disk space and performed  annulotomy.  Little disk material was retrieved from the disk space.  There was extensive epidural venous plexus and electrocautery was  utilized to achieve hemostasis. A hockey stick probe was then placed and  passed freely in the foramen of 3 and 4.  There was good excursion of 3  and 4 root without difficulty.  No active bleeding or CSF leakage.  The  wound was copiously irrigated.  Thrombus-soaked Gelfoam was placed in  the laminotomy defect.  Next, with all retractors removed, the  paraspinous muscle was inspected with no evidence of active bleeding.  The operating microscope which had been draped around the surgical field  was removed.  The dorsolumbar fascia was reapproximated with #1 Vicryl  figure-of-eight sutures and the subcutaneous tissue reapproximated with  2-0 Vicryl suture.  The skin was reapproximated with staples.  The wound  was dressed sterilely.  The patient was awakened without difficultly and  transported to the recovery room in satisfactory condition.   The patient tolerated the procedure well with no complications.   ESTIMATED BLOOD LOSS:  10 cc.      Jene Every, M.D.  Electronically Signed     JB/MEDQ  D:  10/08/2006  T:  10/08/2006  Job:  [id number]

## 2011-02-01 NOTE — Op Note (Signed)
[name], [name]               ACCOUNT NO.:  [id number]   MEDICAL RECORD NO.:  [id number]          PATIENT TYPE:  INP   LOCATION:  NA                           [facility]AC[facility]   PHYSICIAN:  Jene Every, M.D.    DATE OF BIRTH:  May 01, 1928   DATE OF PROCEDURE:  11/01/2004  DATE OF DISCHARGE:                                 OPERATIVE REPORT   PREOPERATIVE DIAGNOSIS:  Spinal stenosis L4-5.   POSTOPERATIVE DIAGNOSIS:  Spinal stenosis L4-5.   PROCEDURE PERFORMED:  Central decompression L4-5, bilateral lateral recess  decompression and foraminotomies at L5, L4.   ANESTHESIA:  General.   ASSISTANT:  __________.   BRIEF HISTORY AND INDICATIONS:  This is a 77 year old male who has had  refractory bilateral lower extremity radicular pain, predominantly in the  left lower extremity, recently in the right lower extremity.  He had a pain  that was worse in standing and relieved with flexion into a recliner with  his legs flexed.  He had been taking increasing amounts of narcotic  analgesics and indicates that he could not walk and engage in his activities  of daily living.  His MRI indicated moderate stenosis at L4-5, particularly  into the lateral recess.  The patient had radicular symptoms on exam on last  office visit and preoperatively of L5 radiculopathy.  He had some 5-/5 EHL  on the left in comparison to the right.  He had 1+ dorsalis pedis pulses.  Good capillary refill.  The patient did have some calcification of the aorta  noted.  His myelogram interestingly on the AP demonstrated significant  restriction of the dye in the lateral recess on the AP at the L4-5 region.  The anterior to posterior lateral myelogram showed some indentation but no  superior A-P compression.  Operative intervention was indicated for  decompression of the lateral recess by undercutting the facet and removing  the ligamentum flavum.  Risks and benefits have been discussed, including  infection,  injury to neurovascular structures, CSF leakage, epidural  fibrosis, adjacent segment disease, need for fusion in the future,  anesthetic complications, cardiopulmonary, DVT, PE, death, etc.   TECHNIQUE:  The patient in the supine position.  After the induction of  adequate general anesthesia and 1 g of Kefzol, he was placed on the [place]  frame.  All bony prominences were well padded.  We ensured that the abdomen  was free, and the hip and popliteal angle were at least 90 degrees.  Good  capillary refill into the lower extremities following the positioning.  Next  the lumbar region was prepped and draped in the usual sterile fashion.  Two  18-gauge spinal needles were utilized to localize the L4-5 interspace,  confirmed with x-rays.  Incision was made from the spinous processes of L4-  L5.  Subcutaneous tissue was dissected.  Electrocautery was utilized to  achieve hemostasis.  The dorsolumbar fascia was identified and divided along  the skin incision.  Paraspinous muscles were elevated from the lamina of L4  and L5.  A Cobb retractor was placed.  A second confirmatory radiograph  was  obtained, with a  Kocher on the spinous processes of L4 and L5.  Noted was a  very small interlaminar window bilaterally, with facet hypertrophy, which I  felt approach would be best to enter centrally, as I discussed with the  patient preoperatively, as opposed to bilateral hemilaminotomies.  He had no  instability on the flexion-extension radiographs.  I felt that without this  there would be a significant requirement of facet sacrifice with the  potential of instability.  Therefore, I removed the majority of the spinous  process of L4 and partially of L5.  Ligamentum flavum and interspinous  ligament was removed from the interspace.  We used the operating microscope  and draped and brought it in the surgical field.  A 2 mm Kerrison and then a  3 mm Kerrison was utilized to detach the ligamentum flavum  attachments on  the cephalad edge of L5.  The laminotomy was widened with a 3 mm Kerrison.  The lamina was undercut cephalad with a 2-3 mm Kerrison detaching the  ligamentum flavum.  There was significant hypertrophic ligamentum flavum  noted, particularly in the lateral recess, with severe lateral recess  stenosis bilaterally.  __________ was utilized to protect the neural  elements, and the utilization of the operating microscope was used to  decompress the lateral recesses with a 2-3 mm Kerrison to perform the  foraminotomies of L5 and of L4, removing essentially ligamentum flavum.  This significantly decompressed the lateral recess and the thecal sac.  Hockey-stick probe placed in the foramen of L4 and L5 after this found to be  widely patent.  Bipolar electrocautery was utilized to achieve strict  hemostasis.  We examined the disc.  There was no evidence of disc  herniation.  Next, inspection revealed a pulsatile thecal sac, nerve roots  unencumbered by compression.  It was felt that the lateral recess or the  area of pathology given significantly, greater on the left in comparison to  the right.  Bone wax was placed over the cancellous surfaces.  The wound was  copiously irrigated with antibiotic irrigation.  We placed a Hemovac and  brought it out through a lateral wound in the skin.  Thrombin-soaked Gelfoam  was placed in the laminotomy defect.  We repaired the fascia with #1 Vicryl  interrupted figure-of-eight sutures, subcutaneous tissue reapproximated with  2-0 Vicryl simple sutures.  The skin was reapproximated with staples.  The  wound was dressed sterilely.  Placed supine on the hospital bed, extubated  without difficulty.  In and out catheter following this, and the patient was  then transported to the recovery room in satisfactory condition.   The patient tolerated the procedure well.  There were no complications.  Blood loss was minimal.      JB/MEDQ  D:  11/01/2004   T:  11/01/2004  Job:  [id number]

## 2011-02-09 ENCOUNTER — Encounter: Payer: Self-pay | Admitting: Internal Medicine

## 2011-02-13 ENCOUNTER — Ambulatory Visit (INDEPENDENT_AMBULATORY_CARE_PROVIDER_SITE_OTHER): Payer: Medicare Other | Admitting: *Deleted

## 2011-02-13 DIAGNOSIS — I4891 Unspecified atrial fibrillation: Secondary | ICD-10-CM

## 2011-02-13 LAB — POCT INR: INR: 1.4

## 2011-02-19 ENCOUNTER — Encounter: Payer: Self-pay | Admitting: Internal Medicine

## 2011-02-20 ENCOUNTER — Ambulatory Visit (INDEPENDENT_AMBULATORY_CARE_PROVIDER_SITE_OTHER): Payer: Medicare Other | Admitting: *Deleted

## 2011-02-20 DIAGNOSIS — I4891 Unspecified atrial fibrillation: Secondary | ICD-10-CM

## 2011-02-20 LAB — POCT INR: INR: 1.8

## 2011-03-06 ENCOUNTER — Ambulatory Visit (INDEPENDENT_AMBULATORY_CARE_PROVIDER_SITE_OTHER): Payer: Medicare Other | Admitting: *Deleted

## 2011-03-06 DIAGNOSIS — I4891 Unspecified atrial fibrillation: Secondary | ICD-10-CM

## 2011-03-06 LAB — POCT INR: INR: 1.7

## 2011-03-08 ENCOUNTER — Other Ambulatory Visit: Payer: Self-pay | Admitting: Cardiology

## 2011-03-08 DIAGNOSIS — I714 Abdominal aortic aneurysm, without rupture: Secondary | ICD-10-CM

## 2011-03-21 ENCOUNTER — Ambulatory Visit (INDEPENDENT_AMBULATORY_CARE_PROVIDER_SITE_OTHER): Payer: Medicare Other | Admitting: *Deleted

## 2011-03-21 DIAGNOSIS — I4891 Unspecified atrial fibrillation: Secondary | ICD-10-CM

## 2011-03-21 LAB — POCT INR: INR: 2.6

## 2011-04-01 ENCOUNTER — Encounter (HOSPITAL_COMMUNITY)
Admission: RE | Admit: 2011-04-01 | Discharge: 2011-04-01 | Disposition: A | Discharge status: Home / Self Care | Payer: Medicare Other | Source: Ambulatory Visit | Attending: Internal Medicine | Admitting: Internal Medicine

## 2011-04-01 ENCOUNTER — Other Ambulatory Visit: Payer: Self-pay

## 2011-04-01 DIAGNOSIS — Z7901 Long term (current) use of anticoagulants: Secondary | ICD-10-CM | POA: Insufficient documentation

## 2011-04-01 DIAGNOSIS — Z9861 Coronary angioplasty status: Secondary | ICD-10-CM | POA: Insufficient documentation

## 2011-04-01 DIAGNOSIS — Z96649 Presence of unspecified artificial hip joint: Secondary | ICD-10-CM | POA: Insufficient documentation

## 2011-04-01 DIAGNOSIS — I714 Abdominal aortic aneurysm, without rupture, unspecified: Secondary | ICD-10-CM | POA: Insufficient documentation

## 2011-04-01 DIAGNOSIS — Z5189 Encounter for other specified aftercare: Secondary | ICD-10-CM | POA: Insufficient documentation

## 2011-04-01 DIAGNOSIS — I252 Old myocardial infarction: Secondary | ICD-10-CM | POA: Insufficient documentation

## 2011-04-01 DIAGNOSIS — J4489 Other specified chronic obstructive pulmonary disease: Secondary | ICD-10-CM | POA: Insufficient documentation

## 2011-04-01 DIAGNOSIS — E785 Hyperlipidemia, unspecified: Secondary | ICD-10-CM | POA: Insufficient documentation

## 2011-04-01 DIAGNOSIS — J9610 Chronic respiratory failure, unspecified whether with hypoxia or hypercapnia: Secondary | ICD-10-CM | POA: Insufficient documentation

## 2011-04-01 DIAGNOSIS — I4891 Unspecified atrial fibrillation: Secondary | ICD-10-CM | POA: Insufficient documentation

## 2011-04-01 DIAGNOSIS — E119 Type 2 diabetes mellitus without complications: Secondary | ICD-10-CM | POA: Insufficient documentation

## 2011-04-01 DIAGNOSIS — I5022 Chronic systolic (congestive) heart failure: Secondary | ICD-10-CM | POA: Insufficient documentation

## 2011-04-01 DIAGNOSIS — J449 Chronic obstructive pulmonary disease, unspecified: Secondary | ICD-10-CM | POA: Insufficient documentation

## 2011-04-01 DIAGNOSIS — I2510 Atherosclerotic heart disease of native coronary artery without angina pectoris: Secondary | ICD-10-CM | POA: Insufficient documentation

## 2011-04-01 DIAGNOSIS — I10 Essential (primary) hypertension: Secondary | ICD-10-CM | POA: Insufficient documentation

## 2011-04-01 DIAGNOSIS — I509 Heart failure, unspecified: Secondary | ICD-10-CM | POA: Insufficient documentation

## 2011-04-02 ENCOUNTER — Encounter: Payer: Self-pay | Admitting: Internal Medicine

## 2011-04-02 ENCOUNTER — Ambulatory Visit (INDEPENDENT_AMBULATORY_CARE_PROVIDER_SITE_OTHER): Payer: Medicare Other | Admitting: Internal Medicine

## 2011-04-02 DIAGNOSIS — I509 Heart failure, unspecified: Secondary | ICD-10-CM

## 2011-04-02 DIAGNOSIS — Z950 Presence of cardiac pacemaker: Secondary | ICD-10-CM

## 2011-04-02 DIAGNOSIS — I495 Sick sinus syndrome: Secondary | ICD-10-CM

## 2011-04-02 DIAGNOSIS — I4891 Unspecified atrial fibrillation: Secondary | ICD-10-CM

## 2011-04-02 LAB — PACEMAKER DEVICE OBSERVATION
BATTERY VOLTAGE: 3.008 V
DEVICE MODEL PM: 7207053
RV LEAD AMPLITUDE: 12 mv
RV LEAD IMPEDENCE PM: 550 Ohm

## 2011-04-02 NOTE — Assessment & Plan Note (Signed)
His current device is working normally. We'll plan to recheck in several months. 

## 2011-04-02 NOTE — Assessment & Plan Note (Signed)
His ventricular rate appears to be well-controlled. He will continue his current medical therapy. 

## 2011-04-02 NOTE — Assessment & Plan Note (Signed)
His chronic systolic heart failure is well compensated. His symptoms are class II. I think based on his advanced age and multiple comorbidities that defibrillator insertion would not be recommended.

## 2011-04-02 NOTE — Progress Notes (Signed)
HPI [name] returns today for followup. He is a very pleasant 77 year old man with a history of symptomatic bradycardia and atrial fibrillation, status post permanent pacemaker insertion. The patient has a history of hypertension. He notes that during the daytime he feels fairly well. When he gets up in the morning however he feels congested and somewhat short of breath. At this point he will take his nebulizer drink a cup of coffee and following this he tends to feel better. He has a long history of COPD and remote tobacco abuse. He stopped smoking completely in December of 2011. He admits to a chronic cough. He has had no syncope. He denies peripheral edema. Allergies  Allergen Reactions  . Morphine Itching     Current Outpatient Prescriptions  Medication Sig Dispense Refill  . bisoprolol (ZEBETA) 5 MG tablet Take 5 mg by mouth daily.        . budesonide (PULMICORT) 0.5 MG/2ML nebulizer solution Take 0.5 mg by nebulization 2 (two) times daily as needed.        . Cholecalciferol (VITAMIN D3) 2000 UNITS TABS Take by mouth.        . digoxin (LANOXIN) 0.25 MG tablet Take 250 mcg by mouth daily.        Marland Kitchen diltiazem (CARDIZEM CD) 360 MG 24 hr capsule Take 360 mg by mouth daily.        . furosemide (LASIX) 40 MG tablet Take 40 mg by mouth daily.        Marland Kitchen ipratropium (ATROVENT) 0.02 % nebulizer solution Take 500 mcg by nebulization 4 (four) times daily.        . isosorbide mononitrate (IMDUR) 60 MG 24 hr tablet Take 60 mg by mouth daily.        . nitroGLYCERIN (NITROSTAT) 0.4 MG SL tablet Place 0.4 mg under the tongue every 5 (five) minutes as needed.        Marland Kitchen oxycodone (OXY-IR) 5 MG capsule Take 5 mg by mouth every 4 (four) hours as needed.        . Potassium Gluconate 550 MG TABS Take 1 tablet by mouth daily.        . rosuvastatin (CRESTOR) 20 MG tablet Take 20 mg by mouth daily.        . sennosides-docusate sodium (SENOKOT-S) 8.6-50 MG tablet Take 1 tablet by mouth. Two tabs po hs       .  warfarin (COUMADIN) 5 MG tablet One tablet four days a week, 1/2 tablet the other three days or as directed.  30 tablet  3     Past Medical History  Diagnosis Date  . Pneumonia 12/06/10    healthcare-associated/ left lower lobe  . COPD (chronic obstructive pulmonary disease)     severe stage IV  . Atrial fibrillation   . Diabetes mellitus     type 2  . Coronary artery disease   . ST elevation (STEMI) myocardial infarction 01//11/12    inferior  . CHF (congestive heart failure)     EF 25% on 09/26/10   . Hypertension   . Hyperlipidemia   . AAA (abdominal aortic aneurysm)   . Osteoarthritis     s/p bilat hip arthroplasty    ROS:   All systems reviewed and negative except as noted in the HPI.   Past Surgical History  Procedure Date  . Hip arthroplasty     s/p right 09/27/10 and s/p left 09/24/10  . Back surgery      Family History  Problem Relation Age of Onset  . Heart attack Mother   . Heart attack Father      History   Social History  . Marital Status: Widowed    Spouse Name: N/A    Number of Children: N/A  . Years of Education: N/A   Occupational History  . Not on file.   Social History Main Topics  . Smoking status: Former Smoker -- 1.5 packs/day for 50 years    Types: Cigarettes    Quit date: 09/02/2009  . Smokeless tobacco: Never Used   Comment: smoked for 85yrs quit for 50yrs started back & smoked for 10ys  . Alcohol Use: No     occ - alcohol  . Drug Use: No  . Sexually Active: Not on file   Other Topics Concern  . Not on file   Social History Narrative  . No narrative on file     BP 122/64  Ht 5\' 9"  (1.753 m)  Wt 181 lb (82.101 kg)  BMI 26.73 kg/m2  Physical Exam:  Well appearing NAD HEENT: Unremarkable Neck:  No JVD, no thyromegally Lymphatics:  No adenopathy Back:  No CVA tenderness Lungs:  Clear. Well-healed pacemaker incision. HEART:  Iregular rate rhythm, no murmurs, no rubs, no clicks Abd:  soft, positive bowel  sounds, no organomegally, no rebound, no guarding Ext:  2 plus pulses, no edema, no cyanosis, no clubbing Skin:  No rashes no nodules Neuro:  CN II through XII intact, motor grossly intact  DEVICE  Normal device function.  See PaceArt for details.   Assess/Plan:

## 2011-04-02 NOTE — Patient Instructions (Signed)
Your physician wants you to follow-up in: 6 months with Dr Taylor You will receive a reminder letter in the mail two months in advance. If you don't receive a letter, please call our office to schedule the follow-up appointment.  

## 2011-04-05 ENCOUNTER — Telehealth: Payer: Self-pay | Admitting: Internal Medicine

## 2011-04-05 DIAGNOSIS — J449 Chronic obstructive pulmonary disease, unspecified: Secondary | ICD-10-CM

## 2011-04-05 MED ORDER — IPRATROPIUM BROMIDE 0.02 % IN SOLN: 500.0000 ug | Freq: Four times a day (QID) | RESPIRATORY_TRACT | Status: DC

## 2011-04-05 MED ORDER — BUDESONIDE 0.5 MG/2ML IN SUSP: 0.5000 mg | Freq: Two times a day (BID) | RESPIRATORY_TRACT | Status: DC | PRN

## 2011-04-05 NOTE — Telephone Encounter (Signed)
Spoke with the patient and he states [name] is telling him there are issues with him getting his neb meds. The pt states one problem is it is too expensive. I called [facility] and they state the pt has a balance on his account which is why they cannot send him his meds.  His pulmicort co-pay is $297 per month. I called [facility] and spoke with them and they state they can get the pt medication cheaper for him. I advised the pt that I am going to send the order to [facility] for his medications. Pt states understanding. [name], CMA

## 2011-04-09 ENCOUNTER — Encounter (HOSPITAL_COMMUNITY): Payer: Medicare Other

## 2011-04-11 ENCOUNTER — Encounter (HOSPITAL_COMMUNITY): Payer: Medicare Other

## 2011-04-16 ENCOUNTER — Encounter (HOSPITAL_COMMUNITY): Payer: Medicare Other

## 2011-04-18 ENCOUNTER — Encounter (HOSPITAL_COMMUNITY): Payer: Medicare Other | Attending: Internal Medicine

## 2011-04-18 DIAGNOSIS — J9610 Chronic respiratory failure, unspecified whether with hypoxia or hypercapnia: Secondary | ICD-10-CM | POA: Insufficient documentation

## 2011-04-18 DIAGNOSIS — Z7901 Long term (current) use of anticoagulants: Secondary | ICD-10-CM | POA: Insufficient documentation

## 2011-04-18 DIAGNOSIS — E119 Type 2 diabetes mellitus without complications: Secondary | ICD-10-CM | POA: Insufficient documentation

## 2011-04-18 DIAGNOSIS — Z9861 Coronary angioplasty status: Secondary | ICD-10-CM | POA: Insufficient documentation

## 2011-04-18 DIAGNOSIS — I714 Abdominal aortic aneurysm, without rupture, unspecified: Secondary | ICD-10-CM | POA: Insufficient documentation

## 2011-04-18 DIAGNOSIS — J449 Chronic obstructive pulmonary disease, unspecified: Secondary | ICD-10-CM | POA: Insufficient documentation

## 2011-04-18 DIAGNOSIS — E785 Hyperlipidemia, unspecified: Secondary | ICD-10-CM | POA: Insufficient documentation

## 2011-04-18 DIAGNOSIS — I2510 Atherosclerotic heart disease of native coronary artery without angina pectoris: Secondary | ICD-10-CM | POA: Insufficient documentation

## 2011-04-18 DIAGNOSIS — Z96649 Presence of unspecified artificial hip joint: Secondary | ICD-10-CM | POA: Insufficient documentation

## 2011-04-18 DIAGNOSIS — I509 Heart failure, unspecified: Secondary | ICD-10-CM | POA: Insufficient documentation

## 2011-04-18 DIAGNOSIS — Z5189 Encounter for other specified aftercare: Secondary | ICD-10-CM | POA: Insufficient documentation

## 2011-04-18 DIAGNOSIS — I10 Essential (primary) hypertension: Secondary | ICD-10-CM | POA: Insufficient documentation

## 2011-04-18 DIAGNOSIS — I252 Old myocardial infarction: Secondary | ICD-10-CM | POA: Insufficient documentation

## 2011-04-18 DIAGNOSIS — J4489 Other specified chronic obstructive pulmonary disease: Secondary | ICD-10-CM | POA: Insufficient documentation

## 2011-04-18 DIAGNOSIS — I5022 Chronic systolic (congestive) heart failure: Secondary | ICD-10-CM | POA: Insufficient documentation

## 2011-04-18 DIAGNOSIS — I4891 Unspecified atrial fibrillation: Secondary | ICD-10-CM | POA: Insufficient documentation

## 2011-04-23 ENCOUNTER — Encounter (HOSPITAL_COMMUNITY): Payer: Medicare Other

## 2011-04-24 ENCOUNTER — Ambulatory Visit (INDEPENDENT_AMBULATORY_CARE_PROVIDER_SITE_OTHER): Payer: Medicare Other | Admitting: *Deleted

## 2011-04-24 DIAGNOSIS — I4891 Unspecified atrial fibrillation: Secondary | ICD-10-CM

## 2011-04-24 MED ORDER — WARFARIN SODIUM 5 MG PO TABS: ORAL_TABLET | ORAL | Status: DC

## 2011-04-25 ENCOUNTER — Encounter (HOSPITAL_COMMUNITY): Payer: Medicare Other

## 2011-04-26 ENCOUNTER — Ambulatory Visit (INDEPENDENT_AMBULATORY_CARE_PROVIDER_SITE_OTHER): Payer: Medicare Other | Admitting: Internal Medicine

## 2011-04-26 ENCOUNTER — Encounter: Payer: Self-pay | Admitting: Internal Medicine

## 2011-04-26 VITALS — BP 122/70 | HR 55 | Temp 97.6°F | Ht 69.0 in | Wt 187.8 lb

## 2011-04-26 DIAGNOSIS — J449 Chronic obstructive pulmonary disease, unspecified: Secondary | ICD-10-CM

## 2011-04-26 DIAGNOSIS — R0982 Postnasal drip: Secondary | ICD-10-CM

## 2011-04-26 NOTE — Assessment & Plan Note (Addendum)
#  COPD  - this is stable but you have severe disesae - I recognize you do not want to do more medications for cost and current satisfaction reasons - please take your nebs regularly wihtout fail - please continue with pulmonary rehab - if you are falling sick, you need to call us straight away or go to ER - will check for alpha 1 genetic test for copd today; several weeks for results to return - have flu shot in fall #Followup -  6 months or sooner if needed

## 2011-04-26 NOTE — Progress Notes (Signed)
  Subjective:    Patient ID: [name], male    DOB: Feb 01, 1928, 77 y.o.   MRN: [id number]  HPI  Problem list 1. 75 pack smoking hx. Quit 2010. Known CAD 2. COPD - Gold stage 3-18 November 2010 spirometry, Did not desaturate May 2012 185 feet x 3 laps. On atrovent, pulmicort nebs 3. AECOPD hx: March 2012 (LLL PNA), April 2012 - clear cxr  OV 04/26/2011: Followup for above. Overall well. COmpliant with pulmicort and atrovent scheduled. Dyspnea since last visit is improved. Doing rehab now and is helping. Rates dyspnea as mild. EXertional for class 2 activiities only now (before class 3). Relieved by rest. Does not want additional nebs to help. Associated cough with mucus present ; this is worst early morning and is associated with sinus drainage post nasally. Sputum is small amount and clear.   Spirometry today is fev1 1.06L/34%, ratio 47 and is unchanged  Review of Systems  Constitutional: Negative for fever and unexpected weight change.  HENT: Negative for ear pain, nosebleeds, congestion, sore throat, rhinorrhea, sneezing, trouble swallowing, dental problem, postnasal drip and sinus pressure.   Eyes: Negative for redness and itching.  Respiratory: Positive for cough. Negative for chest tightness, shortness of breath and wheezing.   Cardiovascular: Negative for palpitations and leg swelling.  Gastrointestinal: Negative for nausea and vomiting.  Genitourinary: Negative for dysuria.  Musculoskeletal: Negative for joint swelling.  Skin: Negative for rash.  Neurological: Negative for headaches.  Hematological: Does not bruise/bleed easily.  Psychiatric/Behavioral: Negative for dysphoric mood. The patient is not nervous/anxious.        Objective:   Physical Exam  Nursing note and vitals reviewed. Constitutional: He is oriented to person, place, and time. He appears well-developed and well-nourished. No distress.  HENT:  Head: Normocephalic and atraumatic.  Right Ear: External ear  normal.  Left Ear: External ear normal.  Mouth/Throat: Oropharynx is clear and moist. No oropharyngeal exudate.  Eyes: Conjunctivae and EOM are normal. Pupils are equal, round, and reactive to light. Right eye exhibits no discharge. Left eye exhibits no discharge. No scleral icterus.  Neck: Normal range of motion. Neck supple. No JVD present. No tracheal deviation present. No thyromegaly present.  Cardiovascular: Normal rate, regular rhythm and intact distal pulses.  Exam reveals no gallop and no friction rub.   No murmur heard. Pulmonary/Chest: Effort normal and breath sounds normal. No respiratory distress. He has no wheezes. He has no rales. He exhibits no tenderness.       coaarse No wheeze Prolonged expiration  Abdominal: Soft. Bowel sounds are normal. He exhibits no distension and no mass. There is no tenderness. There is no rebound and no guarding.  Musculoskeletal: Normal range of motion. He exhibits no edema and no tenderness.  Lymphadenopathy:    He has no cervical adenopathy.  Neurological: He is alert and oriented to person, place, and time. He has normal reflexes. No cranial nerve deficit. Coordination normal.  Skin: Skin is warm and dry. No rash noted. He is not diaphoretic. No erythema. No pallor.  Psychiatric: He has a normal mood and affect. His behavior is normal. Judgment and thought content normal.          Assessment & Plan:

## 2011-04-26 NOTE — Assessment & Plan Note (Signed)
#  Sinus drainage - use netti pot with warm bottled water regularly

## 2011-04-26 NOTE — Patient Instructions (Signed)
#  COPD  - this is stable but you have severe disesae - please take your nebs regularly wihtout fail - please continue with pulmonary rehab - if you are falling sick, you need to call us straight away or go to ER - will check for alpha 1 genetic test for copd today; several weeks for results to return - have flu shot in fall #Sinus drainage - use netti pot with warm bottled water regularly #Followup -  6 months or sooner if needed

## 2011-04-30 ENCOUNTER — Encounter (HOSPITAL_COMMUNITY): Payer: Medicare Other

## 2011-05-02 ENCOUNTER — Encounter (HOSPITAL_COMMUNITY): Payer: Medicare Other

## 2011-05-07 ENCOUNTER — Encounter (HOSPITAL_COMMUNITY): Payer: Medicare Other

## 2011-05-08 ENCOUNTER — Ambulatory Visit (INDEPENDENT_AMBULATORY_CARE_PROVIDER_SITE_OTHER): Payer: Medicare Other | Admitting: *Deleted

## 2011-05-08 DIAGNOSIS — I4891 Unspecified atrial fibrillation: Secondary | ICD-10-CM

## 2011-05-08 LAB — POCT INR: INR: 2.5

## 2011-05-09 ENCOUNTER — Encounter (HOSPITAL_COMMUNITY): Payer: Medicare Other

## 2011-05-14 ENCOUNTER — Encounter (HOSPITAL_COMMUNITY): Payer: Medicare Other

## 2011-05-16 ENCOUNTER — Encounter (HOSPITAL_COMMUNITY): Payer: Medicare Other

## 2011-05-21 ENCOUNTER — Encounter (HOSPITAL_COMMUNITY): Payer: Medicare Other | Attending: Internal Medicine

## 2011-05-21 DIAGNOSIS — Z7901 Long term (current) use of anticoagulants: Secondary | ICD-10-CM | POA: Insufficient documentation

## 2011-05-21 DIAGNOSIS — I4891 Unspecified atrial fibrillation: Secondary | ICD-10-CM | POA: Insufficient documentation

## 2011-05-21 DIAGNOSIS — Z5189 Encounter for other specified aftercare: Secondary | ICD-10-CM | POA: Insufficient documentation

## 2011-05-21 DIAGNOSIS — J449 Chronic obstructive pulmonary disease, unspecified: Secondary | ICD-10-CM | POA: Insufficient documentation

## 2011-05-21 DIAGNOSIS — I2510 Atherosclerotic heart disease of native coronary artery without angina pectoris: Secondary | ICD-10-CM | POA: Insufficient documentation

## 2011-05-21 DIAGNOSIS — Z96649 Presence of unspecified artificial hip joint: Secondary | ICD-10-CM | POA: Insufficient documentation

## 2011-05-21 DIAGNOSIS — I509 Heart failure, unspecified: Secondary | ICD-10-CM | POA: Insufficient documentation

## 2011-05-21 DIAGNOSIS — E119 Type 2 diabetes mellitus without complications: Secondary | ICD-10-CM | POA: Insufficient documentation

## 2011-05-21 DIAGNOSIS — I714 Abdominal aortic aneurysm, without rupture, unspecified: Secondary | ICD-10-CM | POA: Insufficient documentation

## 2011-05-21 DIAGNOSIS — I10 Essential (primary) hypertension: Secondary | ICD-10-CM | POA: Insufficient documentation

## 2011-05-21 DIAGNOSIS — E785 Hyperlipidemia, unspecified: Secondary | ICD-10-CM | POA: Insufficient documentation

## 2011-05-21 DIAGNOSIS — I252 Old myocardial infarction: Secondary | ICD-10-CM | POA: Insufficient documentation

## 2011-05-21 DIAGNOSIS — J9610 Chronic respiratory failure, unspecified whether with hypoxia or hypercapnia: Secondary | ICD-10-CM | POA: Insufficient documentation

## 2011-05-21 DIAGNOSIS — Z9861 Coronary angioplasty status: Secondary | ICD-10-CM | POA: Insufficient documentation

## 2011-05-21 DIAGNOSIS — J4489 Other specified chronic obstructive pulmonary disease: Secondary | ICD-10-CM | POA: Insufficient documentation

## 2011-05-21 DIAGNOSIS — I5022 Chronic systolic (congestive) heart failure: Secondary | ICD-10-CM | POA: Insufficient documentation

## 2011-05-23 ENCOUNTER — Encounter (HOSPITAL_COMMUNITY): Payer: Medicare Other

## 2011-05-28 ENCOUNTER — Encounter (HOSPITAL_COMMUNITY): Payer: Medicare Other

## 2011-05-28 ENCOUNTER — Encounter: Payer: Self-pay | Admitting: Internal Medicine

## 2011-05-30 ENCOUNTER — Encounter (HOSPITAL_COMMUNITY): Payer: Medicare Other

## 2011-06-04 ENCOUNTER — Encounter (HOSPITAL_COMMUNITY): Payer: Medicare Other

## 2011-06-05 ENCOUNTER — Ambulatory Visit (INDEPENDENT_AMBULATORY_CARE_PROVIDER_SITE_OTHER): Payer: Medicare Other | Admitting: *Deleted

## 2011-06-05 DIAGNOSIS — I4891 Unspecified atrial fibrillation: Secondary | ICD-10-CM

## 2011-06-06 ENCOUNTER — Encounter: Payer: Self-pay | Admitting: Internal Medicine

## 2011-06-06 ENCOUNTER — Encounter (HOSPITAL_COMMUNITY): Payer: Medicare Other

## 2011-06-06 LAB — BASIC METABOLIC PANEL
Calcium: 7.6 — ABNORMAL LOW
Calcium: 8 — ABNORMAL LOW
Chloride: 103
Creatinine, Ser: 0.95
GFR calc Af Amer: >60
GFR calc Af Amer: >60
GFR calc non Af Amer: >60
GFR calc non Af Amer: >60
Potassium: 4.1
Sodium: 133 — ABNORMAL LOW

## 2011-06-06 LAB — DIFFERENTIAL
Basophils Absolute: 0
Eosinophils Absolute: 0
Eosinophils Relative: 0

## 2011-06-06 LAB — CBC
HCT: 27.4 — ABNORMAL LOW
HCT: 29.3 — ABNORMAL LOW
HCT: 29.4 — ABNORMAL LOW
Hemoglobin: 10.3 — ABNORMAL LOW
MCHC: 34.7
MCV: 91
Platelets: 114 — ABNORMAL LOW
Platelets: 122 — ABNORMAL LOW
Platelets: 132 — ABNORMAL LOW
RBC: 3.66 — ABNORMAL LOW
RBC: 4.57
RDW: 13.6
RDW: 13.6
WBC: 5.4
WBC: 6.1
WBC: 6.5
WBC: 7.3

## 2011-06-06 LAB — COMPREHENSIVE METABOLIC PANEL
ALT: 16
AST: 18
CO2: 29
Chloride: 106
GFR calc Af Amer: >60
GFR calc non Af Amer: >60
Sodium: 142
Total Bilirubin: 1

## 2011-06-06 LAB — PROTIME-INR
INR: 1.1
INR: 1.7 — ABNORMAL HIGH
Prothrombin Time: 12.6
Prothrombin Time: 14.8
Prothrombin Time: 19.8 — ABNORMAL HIGH
Prothrombin Time: 27.5 — ABNORMAL HIGH

## 2011-06-06 LAB — URINALYSIS, ROUTINE W REFLEX MICROSCOPIC
Bilirubin Urine: NEGATIVE
Nitrite: NEGATIVE
Specific Gravity, Urine: 1.021
pH: 5.5

## 2011-06-06 LAB — B-NATRIURETIC PEPTIDE (CONVERTED LAB): Pro B Natriuretic peptide (BNP): 210 — ABNORMAL HIGH

## 2011-06-06 LAB — TROPONIN I: Troponin I: 0.04

## 2011-06-06 LAB — CK TOTAL AND CKMB (NOT AT ARMC)
CK, MB: 1.6
Relative Index: 0.6

## 2011-06-06 LAB — TYPE AND SCREEN: Antibody Screen: NEGATIVE

## 2011-06-06 LAB — HEMOGLOBIN AND HEMATOCRIT, BLOOD
HCT: 37.1 — ABNORMAL LOW
Hemoglobin: 12.7 — ABNORMAL LOW

## 2011-06-11 ENCOUNTER — Encounter (HOSPITAL_COMMUNITY): Payer: Medicare Other

## 2011-06-13 ENCOUNTER — Encounter (HOSPITAL_COMMUNITY): Payer: Medicare Other

## 2011-06-18 ENCOUNTER — Encounter (HOSPITAL_COMMUNITY): Payer: Medicare Other

## 2011-06-20 ENCOUNTER — Encounter (HOSPITAL_COMMUNITY): Payer: Medicare Other | Attending: Internal Medicine

## 2011-06-20 DIAGNOSIS — Z5189 Encounter for other specified aftercare: Secondary | ICD-10-CM | POA: Insufficient documentation

## 2011-06-20 DIAGNOSIS — Z9861 Coronary angioplasty status: Secondary | ICD-10-CM | POA: Insufficient documentation

## 2011-06-20 DIAGNOSIS — I4891 Unspecified atrial fibrillation: Secondary | ICD-10-CM | POA: Insufficient documentation

## 2011-06-20 DIAGNOSIS — Z7901 Long term (current) use of anticoagulants: Secondary | ICD-10-CM | POA: Insufficient documentation

## 2011-06-20 DIAGNOSIS — J9610 Chronic respiratory failure, unspecified whether with hypoxia or hypercapnia: Secondary | ICD-10-CM | POA: Insufficient documentation

## 2011-06-20 DIAGNOSIS — Z96649 Presence of unspecified artificial hip joint: Secondary | ICD-10-CM | POA: Insufficient documentation

## 2011-06-20 DIAGNOSIS — I10 Essential (primary) hypertension: Secondary | ICD-10-CM | POA: Insufficient documentation

## 2011-06-20 DIAGNOSIS — J449 Chronic obstructive pulmonary disease, unspecified: Secondary | ICD-10-CM | POA: Insufficient documentation

## 2011-06-20 DIAGNOSIS — J4489 Other specified chronic obstructive pulmonary disease: Secondary | ICD-10-CM | POA: Insufficient documentation

## 2011-06-20 DIAGNOSIS — E785 Hyperlipidemia, unspecified: Secondary | ICD-10-CM | POA: Insufficient documentation

## 2011-06-20 DIAGNOSIS — I509 Heart failure, unspecified: Secondary | ICD-10-CM | POA: Insufficient documentation

## 2011-06-20 DIAGNOSIS — I714 Abdominal aortic aneurysm, without rupture, unspecified: Secondary | ICD-10-CM | POA: Insufficient documentation

## 2011-06-20 DIAGNOSIS — I252 Old myocardial infarction: Secondary | ICD-10-CM | POA: Insufficient documentation

## 2011-06-20 DIAGNOSIS — I5022 Chronic systolic (congestive) heart failure: Secondary | ICD-10-CM | POA: Insufficient documentation

## 2011-06-20 DIAGNOSIS — I2510 Atherosclerotic heart disease of native coronary artery without angina pectoris: Secondary | ICD-10-CM | POA: Insufficient documentation

## 2011-06-20 DIAGNOSIS — E119 Type 2 diabetes mellitus without complications: Secondary | ICD-10-CM | POA: Insufficient documentation

## 2011-06-24 ENCOUNTER — Encounter (INDEPENDENT_AMBULATORY_CARE_PROVIDER_SITE_OTHER): Payer: Medicare Other | Admitting: Cardiology

## 2011-06-24 DIAGNOSIS — I714 Abdominal aortic aneurysm, without rupture: Secondary | ICD-10-CM

## 2011-06-25 ENCOUNTER — Encounter (HOSPITAL_COMMUNITY): Payer: Medicare Other

## 2011-06-27 ENCOUNTER — Encounter (HOSPITAL_COMMUNITY): Payer: Medicare Other

## 2011-07-02 ENCOUNTER — Encounter (HOSPITAL_COMMUNITY): Payer: Medicare Other

## 2011-07-03 ENCOUNTER — Ambulatory Visit (INDEPENDENT_AMBULATORY_CARE_PROVIDER_SITE_OTHER): Payer: Medicare Other | Admitting: *Deleted

## 2011-07-03 DIAGNOSIS — I4891 Unspecified atrial fibrillation: Secondary | ICD-10-CM

## 2011-07-04 ENCOUNTER — Encounter (HOSPITAL_COMMUNITY): Payer: Medicare Other

## 2011-07-09 ENCOUNTER — Encounter (HOSPITAL_COMMUNITY): Payer: Medicare Other

## 2011-07-11 ENCOUNTER — Encounter (HOSPITAL_COMMUNITY): Payer: Medicare Other

## 2011-07-16 ENCOUNTER — Encounter (HOSPITAL_COMMUNITY): Payer: Medicare Other

## 2011-07-18 ENCOUNTER — Encounter (HOSPITAL_COMMUNITY): Payer: Medicare Other

## 2011-07-23 ENCOUNTER — Encounter (HOSPITAL_COMMUNITY): Payer: Medicare Other

## 2011-07-24 ENCOUNTER — Ambulatory Visit (INDEPENDENT_AMBULATORY_CARE_PROVIDER_SITE_OTHER): Payer: Medicare Other | Admitting: *Deleted

## 2011-07-24 DIAGNOSIS — I4891 Unspecified atrial fibrillation: Secondary | ICD-10-CM

## 2011-07-24 LAB — POCT INR: INR: 3

## 2011-08-13 ENCOUNTER — Emergency Department (HOSPITAL_COMMUNITY): Payer: Medicare Other

## 2011-08-13 ENCOUNTER — Other Ambulatory Visit: Payer: Self-pay

## 2011-08-13 ENCOUNTER — Encounter (HOSPITAL_COMMUNITY)
Admission: EM | Disposition: A | Discharge status: Home / Self Care | Payer: Self-pay | Source: Home / Self Care | Attending: Cardiovascular Disease

## 2011-08-13 ENCOUNTER — Encounter (HOSPITAL_COMMUNITY): Payer: Self-pay | Admitting: Emergency Medicine

## 2011-08-13 ENCOUNTER — Inpatient Hospital Stay (HOSPITAL_COMMUNITY)
Admission: EM | Admit: 2011-08-13 | Discharge: 2011-08-15 | DRG: 247 | Disposition: A | Discharge status: Home / Self Care | Payer: Medicare Other | Attending: Cardiovascular Disease | Admitting: Cardiovascular Disease

## 2011-08-13 DIAGNOSIS — J449 Chronic obstructive pulmonary disease, unspecified: Secondary | ICD-10-CM | POA: Diagnosis present

## 2011-08-13 DIAGNOSIS — Z79899 Other long term (current) drug therapy: Secondary | ICD-10-CM

## 2011-08-13 DIAGNOSIS — I2510 Atherosclerotic heart disease of native coronary artery without angina pectoris: Secondary | ICD-10-CM | POA: Diagnosis present

## 2011-08-13 DIAGNOSIS — I200 Unstable angina: Secondary | ICD-10-CM

## 2011-08-13 DIAGNOSIS — Z8249 Family history of ischemic heart disease and other diseases of the circulatory system: Secondary | ICD-10-CM

## 2011-08-13 DIAGNOSIS — J4489 Other specified chronic obstructive pulmonary disease: Secondary | ICD-10-CM | POA: Diagnosis present

## 2011-08-13 DIAGNOSIS — I214 Non-ST elevation (NSTEMI) myocardial infarction: Secondary | ICD-10-CM

## 2011-08-13 DIAGNOSIS — I4891 Unspecified atrial fibrillation: Secondary | ICD-10-CM | POA: Diagnosis present

## 2011-08-13 DIAGNOSIS — Z950 Presence of cardiac pacemaker: Secondary | ICD-10-CM

## 2011-08-13 DIAGNOSIS — Z7901 Long term (current) use of anticoagulants: Secondary | ICD-10-CM

## 2011-08-13 DIAGNOSIS — J439 Emphysema, unspecified: Secondary | ICD-10-CM | POA: Diagnosis present

## 2011-08-13 DIAGNOSIS — I2589 Other forms of chronic ischemic heart disease: Secondary | ICD-10-CM | POA: Diagnosis present

## 2011-08-13 DIAGNOSIS — R079 Chest pain, unspecified: Secondary | ICD-10-CM

## 2011-08-13 DIAGNOSIS — I2582 Chronic total occlusion of coronary artery: Secondary | ICD-10-CM | POA: Diagnosis present

## 2011-08-13 DIAGNOSIS — I714 Abdominal aortic aneurysm, without rupture, unspecified: Secondary | ICD-10-CM | POA: Diagnosis present

## 2011-08-13 DIAGNOSIS — M1990 Unspecified osteoarthritis, unspecified site: Secondary | ICD-10-CM | POA: Diagnosis present

## 2011-08-13 DIAGNOSIS — E119 Type 2 diabetes mellitus without complications: Secondary | ICD-10-CM | POA: Diagnosis present

## 2011-08-13 DIAGNOSIS — Z87891 Personal history of nicotine dependence: Secondary | ICD-10-CM

## 2011-08-13 DIAGNOSIS — Z7902 Long term (current) use of antithrombotics/antiplatelets: Secondary | ICD-10-CM

## 2011-08-13 DIAGNOSIS — I252 Old myocardial infarction: Secondary | ICD-10-CM

## 2011-08-13 DIAGNOSIS — Z886 Allergy status to analgesic agent status: Secondary | ICD-10-CM

## 2011-08-13 DIAGNOSIS — I509 Heart failure, unspecified: Secondary | ICD-10-CM | POA: Diagnosis present

## 2011-08-13 HISTORY — PX: PERCUTANEOUS CORONARY STENT INTERVENTION (PCI-S): SHX5485

## 2011-08-13 HISTORY — DX: Angina pectoris, unspecified: I20.9

## 2011-08-13 HISTORY — PX: LEFT HEART CATHETERIZATION WITH CORONARY ANGIOGRAM: SHX5451

## 2011-08-13 LAB — HEMOGLOBIN A1C
Hgb A1c MFr Bld: 7 % — ABNORMAL HIGH (ref <5.7)
Mean Plasma Glucose: 154 mg/dL — ABNORMAL HIGH (ref <117)

## 2011-08-13 LAB — BASIC METABOLIC PANEL
BUN: 21 mg/dL (ref 6–23)
Calcium: 9.8 mg/dL (ref 8.4–10.5)
GFR calc non Af Amer: 85 mL/min — ABNORMAL LOW (ref >90)
Glucose, Bld: 176 mg/dL — ABNORMAL HIGH (ref 70–99)
Sodium: 138 mEq/L (ref 135–145)

## 2011-08-13 LAB — GLUCOSE, CAPILLARY
Glucose-Capillary: 129 mg/dL — ABNORMAL HIGH (ref 70–99)
Glucose-Capillary: 113 mg/dL — ABNORMAL HIGH (ref 70–99)

## 2011-08-13 LAB — POCT I-STAT TROPONIN I: Troponin i, poc: 3.69 ng/mL (ref 0.00–0.08)

## 2011-08-13 LAB — POCT I-STAT, CHEM 8
Calcium, Ion: 1.2 mmol/L (ref 1.12–1.32)
Glucose, Bld: 173 mg/dL — ABNORMAL HIGH (ref 70–99)
HCT: 43 % (ref 39.0–52.0)
Hemoglobin: 14.6 g/dL (ref 13.0–17.0)
Potassium: 4 mEq/L (ref 3.5–5.1)

## 2011-08-13 LAB — CARDIAC PANEL(CRET KIN+CKTOT+MB+TROPI)
CK, MB: 54.3 ng/mL (ref 0.3–4.0)
CK, MB: 128.5 ng/mL (ref 0.3–4.0)
Relative Index: 13.3 — ABNORMAL HIGH (ref 0.0–2.5)
Total CK: 951 U/L — ABNORMAL HIGH (ref 7–232)
Troponin I: 5.38 ng/mL (ref <0.30)

## 2011-08-13 LAB — CBC
Hemoglobin: 14.6 g/dL (ref 13.0–17.0)
MCH: 30.9 pg (ref 26.0–34.0)
MCHC: 33.5 g/dL (ref 30.0–36.0)
RDW: 14.1 % (ref 11.5–15.5)

## 2011-08-13 LAB — POCT ACTIVATED CLOTTING TIME: Activated Clotting Time: 463 seconds

## 2011-08-13 SURGERY — PERCUTANEOUS CORONARY STENT INTERVENTION (PCI-S)
Anesthesia: LOCAL

## 2011-08-13 SURGERY — LEFT HEART CATHETERIZATION WITH CORONARY ANGIOGRAM
Anesthesia: LOCAL

## 2011-08-13 MED ORDER — POTASSIUM GLUCONATE 550 MG PO TABS: 1.0000 | ORAL_TABLET | Freq: Every day | ORAL | Status: DC

## 2011-08-13 MED ORDER — DIAZEPAM 5 MG PO TABS: 5.0000 mg | ORAL_TABLET | ORAL | Status: DC

## 2011-08-13 MED ORDER — SODIUM CHLORIDE 0.9 % IJ SOLN: 3.0000 mL | INTRAMUSCULAR | Status: DC | PRN

## 2011-08-13 MED ORDER — FUROSEMIDE 40 MG PO TABS
40.0000 mg | ORAL_TABLET | Freq: Every day | ORAL | Status: DC
  Administered 2011-08-13 – 2011-08-15 (×3): 40 mg via ORAL
  Filled 2011-08-13 (×3): qty 1

## 2011-08-13 MED ORDER — HEPARIN (PORCINE) IN NACL 2-0.9 UNIT/ML-% IJ SOLN
INTRAMUSCULAR | Status: AC
  Filled 2011-08-13: qty 2000

## 2011-08-13 MED ORDER — OXYCODONE HCL 5 MG PO CAPS: 5.0000 mg | ORAL_CAPSULE | ORAL | Status: DC | PRN

## 2011-08-13 MED ORDER — ASPIRIN 300 MG RE SUPP: 300.0000 mg | RECTAL | Status: DC

## 2011-08-13 MED ORDER — INSULIN ASPART 100 UNIT/ML ~~LOC~~ SOLN: 0.0000 [IU] | Freq: Every day | SUBCUTANEOUS | Status: DC

## 2011-08-13 MED ORDER — DILTIAZEM HCL ER COATED BEADS 360 MG PO CP24
360.0000 mg | ORAL_CAPSULE | Freq: Every day | ORAL | Status: DC
  Administered 2011-08-13 – 2011-08-15 (×3): 360 mg via ORAL
  Filled 2011-08-13 (×3): qty 1

## 2011-08-13 MED ORDER — ROSUVASTATIN CALCIUM 40 MG PO TABS
40.0000 mg | ORAL_TABLET | Freq: Every day | ORAL | Status: DC
  Administered 2011-08-13 – 2011-08-15 (×3): 40 mg via ORAL
  Filled 2011-08-13 (×3): qty 1

## 2011-08-13 MED ORDER — LIDOCAINE HCL (PF) 1 % IJ SOLN
INTRAMUSCULAR | Status: AC
  Filled 2011-08-13: qty 30

## 2011-08-13 MED ORDER — SODIUM CHLORIDE 0.9 % IV SOLN: 250.0000 mL | INTRAVENOUS | Status: DC | PRN

## 2011-08-13 MED ORDER — ONDANSETRON HCL 4 MG/2ML IJ SOLN: 4.0000 mg | Freq: Four times a day (QID) | INTRAMUSCULAR | Status: DC | PRN

## 2011-08-13 MED ORDER — ACETAMINOPHEN 325 MG PO TABS: 650.0000 mg | ORAL_TABLET | ORAL | Status: DC | PRN

## 2011-08-13 MED ORDER — NITROGLYCERIN 0.2 MG/ML ON CALL CATH LAB
INTRAVENOUS | Status: AC
  Filled 2011-08-13: qty 1

## 2011-08-13 MED ORDER — ASPIRIN 81 MG PO CHEW: 324.0000 mg | CHEWABLE_TABLET | ORAL | Status: DC

## 2011-08-13 MED ORDER — SODIUM CHLORIDE 0.9 % IV SOLN: INTRAVENOUS | Status: AC

## 2011-08-13 MED ORDER — NITROGLYCERIN 0.4 MG SL SUBL: 0.4000 mg | SUBLINGUAL_TABLET | SUBLINGUAL | Status: DC | PRN

## 2011-08-13 MED ORDER — ASPIRIN EC 81 MG PO TBEC
81.0000 mg | DELAYED_RELEASE_TABLET | Freq: Every day | ORAL | Status: DC
  Filled 2011-08-13: qty 1

## 2011-08-13 MED ORDER — ONDANSETRON HCL 4 MG/2ML IJ SOLN
4.0000 mg | Freq: Once | INTRAMUSCULAR | Status: AC
  Administered 2011-08-13: 4 mg via INTRAVENOUS
  Filled 2011-08-13: qty 2

## 2011-08-13 MED ORDER — OXYCODONE HCL 5 MG PO TABS: 5.0000 mg | ORAL_TABLET | ORAL | Status: DC | PRN

## 2011-08-13 MED ORDER — MORPHINE SULFATE 4 MG/ML IJ SOLN
4.0000 mg | Freq: Once | INTRAMUSCULAR | Status: AC
  Administered 2011-08-13: 4 mg via INTRAVENOUS
  Filled 2011-08-13: qty 1

## 2011-08-13 MED ORDER — CLOPIDOGREL BISULFATE 75 MG PO TABS
75.0000 mg | ORAL_TABLET | Freq: Every day | ORAL | Status: DC
  Administered 2011-08-14 – 2011-08-15 (×2): 75 mg via ORAL
  Filled 2011-08-13 (×2): qty 1

## 2011-08-13 MED ORDER — VITAMIN D3 25 MCG (1000 UNIT) PO TABS
2000.0000 [IU] | ORAL_TABLET | Freq: Every day | ORAL | Status: DC
  Administered 2011-08-13 – 2011-08-15 (×3): 2000 [IU] via ORAL
  Filled 2011-08-13 (×3): qty 2

## 2011-08-13 MED ORDER — SODIUM CHLORIDE 0.9 % IJ SOLN
3.0000 mL | Freq: Two times a day (BID) | INTRAMUSCULAR | Status: DC
  Administered 2011-08-13 – 2011-08-15 (×4): 3 mL via INTRAVENOUS

## 2011-08-13 MED ORDER — SENNOSIDES-DOCUSATE SODIUM 8.6-50 MG PO TABS
2.0000 | ORAL_TABLET | Freq: Every day | ORAL | Status: DC
  Administered 2011-08-14: 2 via ORAL
  Filled 2011-08-13: qty 2

## 2011-08-13 MED ORDER — WARFARIN SODIUM 7.5 MG PO TABS
7.5000 mg | ORAL_TABLET | Freq: Once | ORAL | Status: AC
  Administered 2011-08-13: 7.5 mg via ORAL
  Filled 2011-08-13: qty 1

## 2011-08-13 MED ORDER — MIDAZOLAM HCL 2 MG/2ML IJ SOLN
INTRAMUSCULAR | Status: AC
  Filled 2011-08-13: qty 2

## 2011-08-13 MED ORDER — SODIUM CHLORIDE 0.9 % IJ SOLN: 3.0000 mL | Freq: Two times a day (BID) | INTRAMUSCULAR | Status: DC

## 2011-08-13 MED ORDER — NITROGLYCERIN IN D5W 200-5 MCG/ML-% IV SOLN: 5.0000 ug/min | INTRAVENOUS | Status: DC

## 2011-08-13 MED ORDER — METOPROLOL TARTRATE 12.5 MG HALF TABLET
12.5000 mg | ORAL_TABLET | Freq: Two times a day (BID) | ORAL | Status: DC
  Administered 2011-08-13 – 2011-08-15 (×4): 12.5 mg via ORAL
  Filled 2011-08-13 (×5): qty 1

## 2011-08-13 MED ORDER — OXYCODONE-ACETAMINOPHEN 5-325 MG PO TABS: 1.0000 | ORAL_TABLET | ORAL | Status: DC | PRN

## 2011-08-13 MED ORDER — FENTANYL CITRATE 0.05 MG/ML IJ SOLN
INTRAMUSCULAR | Status: AC
  Filled 2011-08-13: qty 2

## 2011-08-13 MED ORDER — HEPARIN SOD (PORCINE) IN D5W 100 UNIT/ML IV SOLN: 900.0000 [IU]/h | INTRAVENOUS | Status: DC

## 2011-08-13 MED ORDER — BUDESONIDE 0.5 MG/2ML IN SUSP
0.5000 mg | Freq: Two times a day (BID) | RESPIRATORY_TRACT | Status: DC | PRN
  Filled 2011-08-13: qty 2

## 2011-08-13 MED ORDER — VITAMIN D3 50 MCG (2000 UT) PO TABS: 2000.0000 [IU] | ORAL_TABLET | Freq: Every day | ORAL | Status: DC

## 2011-08-13 MED ORDER — SENNA-DOCUSATE SODIUM 8.6-50 MG PO TABS: 2.0000 | ORAL_TABLET | Freq: Every day | ORAL | Status: DC

## 2011-08-13 MED ORDER — DIGOXIN 250 MCG PO TABS
250.0000 ug | ORAL_TABLET | Freq: Every day | ORAL | Status: DC
  Administered 2011-08-13 – 2011-08-15 (×3): 250 ug via ORAL
  Filled 2011-08-13 (×3): qty 1

## 2011-08-13 MED ORDER — BIVALIRUDIN 250 MG IV SOLR
INTRAVENOUS | Status: AC
  Filled 2011-08-13: qty 250

## 2011-08-13 MED ORDER — SODIUM CHLORIDE 0.9 % IV SOLN: INTRAVENOUS | Status: DC

## 2011-08-13 MED ORDER — BUDESONIDE 0.5 MG/2ML IN SUSP
0.5000 mg | Freq: Two times a day (BID) | RESPIRATORY_TRACT | Status: DC | PRN
  Administered 2011-08-13: 0.5 mg via RESPIRATORY_TRACT
  Filled 2011-08-13: qty 2

## 2011-08-13 MED ORDER — INSULIN ASPART 100 UNIT/ML ~~LOC~~ SOLN
0.0000 [IU] | Freq: Three times a day (TID) | SUBCUTANEOUS | Status: DC
  Administered 2011-08-13 – 2011-08-15 (×4): 2 [IU] via SUBCUTANEOUS
  Filled 2011-08-13: qty 3

## 2011-08-13 MED ORDER — CLOPIDOGREL BISULFATE 300 MG PO TABS
ORAL_TABLET | ORAL | Status: AC
  Filled 2011-08-13: qty 2

## 2011-08-13 MED ORDER — ISOSORBIDE MONONITRATE ER 60 MG PO TB24
60.0000 mg | ORAL_TABLET | Freq: Every day | ORAL | Status: DC
  Administered 2011-08-13 – 2011-08-15 (×3): 60 mg via ORAL
  Filled 2011-08-13 (×3): qty 1

## 2011-08-13 MED ORDER — IPRATROPIUM BROMIDE 0.02 % IN SOLN
500.0000 ug | Freq: Four times a day (QID) | RESPIRATORY_TRACT | Status: DC
  Administered 2011-08-13 – 2011-08-15 (×6): 500 ug via RESPIRATORY_TRACT
  Filled 2011-08-13 (×5): qty 2.5

## 2011-08-13 MED ORDER — ALBUTEROL SULFATE (5 MG/ML) 0.5% IN NEBU
2.5000 mg | INHALATION_SOLUTION | RESPIRATORY_TRACT | Status: DC | PRN
  Administered 2011-08-14 (×2): 2.5 mg via RESPIRATORY_TRACT
  Filled 2011-08-13 (×2): qty 0.5

## 2011-08-13 NOTE — ED Notes (Signed)
Patient is resting comfortably. 

## 2011-08-13 NOTE — Interval H&P Note (Signed)
History and Physical Interval Note:   08/13/2011   11:25 AM   [name]  has presented today for surgery, with the diagnosis of NSTEMI  The various methods of treatment have been discussed with the patient and family. After consideration of risks, benefits and other options for treatment, the patient has consented to  Procedure(s): LEFT HEART CATH as a surgical intervention .  The patients' history has been reviewed, patient examined, no change in status, stable for surgery.  I have reviewed the patients' chart and labs.  Questions were answered to the patient's satisfaction.     Tonny Bollman  MD

## 2011-08-13 NOTE — Progress Notes (Signed)
UR Completed. [name] 08/13/2011 [phone]  

## 2011-08-13 NOTE — ED Notes (Signed)
Zoll pads placed on pt. MD made aware of Vtac run.

## 2011-08-13 NOTE — ED Notes (Signed)
Family at bedside. 

## 2011-08-13 NOTE — ED Provider Notes (Signed)
History     CSN: [id number] Arrival date & time: 08/13/2011  3:49 AM   First MD Initiated Contact with Patient 08/13/11 [phone]      Chief Complaint  Patient presents with  . Chest Pain    (Consider location/radiation/quality/duration/timing/severity/associated sxs/prior treatment) The history is provided by the patient.    The patient is an 77 year old male, with known history of coronary artery disease, including stents, who presents to the emergency department with right sided chest pain that goes down.  His right arm, associated with shortness of breath, nausea.  The symptoms started around 9:00 last night.  They have been constant.  He says this is the same type of pain.  He had when he had a heart attack in the past.   He took 2 nitroglycerin and was given another nitroglycerin en route by EMS.  His pain has decreased to a 6/10.  He denies fevers, chills, cough, or diaphoresis.  He, says that he does not recall the last time.  He took nitroglycerin.  It has been so long ago.  Past Medical History  Diagnosis Date  . Pneumonia 12/06/10    healthcare-associated/ left lower lobe  . COPD (chronic obstructive pulmonary disease)     severe stage IV  . Atrial fibrillation   . Diabetes mellitus     type 2  . Coronary artery disease   . ST elevation (STEMI) myocardial infarction 01//11/12    inferior  . CHF (congestive heart failure)     EF 25% on 09/26/10   . Hypertension   . Hyperlipidemia   . AAA (abdominal aortic aneurysm)   . Osteoarthritis     s/p bilat hip arthroplasty  . COPD, severe 12/26/2010    Past Surgical History  Procedure Date  . Hip arthroplasty     s/p right 09/27/10 and s/p left 09/24/10  . Back surgery     Family History  Problem Relation Age of Onset  . Heart attack Mother   . Heart attack Father     History  Substance Use Topics  . Smoking status: Former Smoker -- 1.5 packs/day for 50 years    Types: Cigarettes    Quit date: 09/02/2009  .  Smokeless tobacco: Never Used   Comment: smoked for 66yrs quit for 17yrs started back & smoked for 10ys  . Alcohol Use: No     occ - alcohol      Review of Systems  Constitutional: Negative for fever and diaphoresis.  HENT: Negative for neck pain.   Eyes: Negative for redness.  Respiratory: Positive for shortness of breath. Negative for cough and chest tightness.   Cardiovascular: Positive for chest pain. Negative for palpitations.  Gastrointestinal: Positive for nausea. Negative for vomiting, abdominal pain and diarrhea.  Musculoskeletal: Negative for back pain.  Skin: Negative for color change.  Neurological: Negative for headaches.  Psychiatric/Behavioral: Negative for confusion.    Allergies  Morphine  Home Medications   Current Outpatient Rx  Name Route Sig Dispense Refill  . BISOPROLOL FUMARATE 5 MG PO TABS Oral Take 5 mg by mouth daily.      . BUDESONIDE 0.5 MG/2ML IN SUSP Nebulization Take 2 mLs (0.5 mg total) by nebulization 2 (two) times daily as needed. 60 mL 6  . VITAMIN D3 2000 UNITS PO TABS Oral Take by mouth.      Marland Kitchen DIGOXIN 0.25 MG PO TABS Oral Take 250 mcg by mouth daily.      Marland Kitchen DILTIAZEM HCL COATED BEADS  360 MG PO CP24 Oral Take 360 mg by mouth daily.      . FUROSEMIDE 40 MG PO TABS Oral Take 40 mg by mouth daily.      . IPRATROPIUM BROMIDE 0.02 % IN SOLN Nebulization Take 2.5 mLs (500 mcg total) by nebulization 4 (four) times daily. 300 mL 6  . ISOSORBIDE MONONITRATE ER 60 MG PO TB24 Oral Take 60 mg by mouth daily.      Marland Kitchen NITROGLYCERIN 0.4 MG SL SUBL Sublingual Place 0.4 mg under the tongue every 5 (five) minutes as needed.      . OXYCODONE HCL 5 MG PO CAPS Oral Take 5 mg by mouth every 4 (four) hours as needed. For pain    . POTASSIUM GLUCONATE 550 MG PO TABS Oral Take 1 tablet by mouth daily.      Marland Kitchen ROSUVASTATIN CALCIUM 20 MG PO TABS Oral Take 20 mg by mouth daily.      . SENNA-DOCUSATE SODIUM 8.6-50 MG PO TABS Oral Take 2 tablets by mouth at bedtime.       . WARFARIN SODIUM 5 MG PO TABS Oral Take 5-7.5 mg by mouth daily. Take 7.5mg  on Sunday and take 5mg  on all other days        BP 111/50  Pulse 71  Temp(Src) 97.7 F (36.5 C) (Oral)  Resp 24  SpO2 97%  Physical Exam  Constitutional: He is oriented to person, place, and time. He appears well-developed and well-nourished. No distress.  HENT:  Head: Normocephalic and atraumatic.  Eyes: EOM are normal. Pupils are equal, round, and reactive to light.  Neck: Normal range of motion. Neck supple.  Cardiovascular: Normal rate, regular rhythm and normal heart sounds.   No murmur heard. Pulmonary/Chest: Effort normal and breath sounds normal. No respiratory distress. He has no wheezes. He has no rales.  Abdominal: Soft. Bowel sounds are normal. He exhibits no distension and no mass. There is no tenderness. There is no rebound and no guarding.  Musculoskeletal: Normal range of motion. He exhibits no edema and no tenderness.  Neurological: He is alert and oriented to person, place, and time. No cranial nerve deficit.  Skin: Skin is warm and dry. He is not diaphoretic.  Psychiatric: He has a normal mood and affect. His behavior is normal.    ED Course  Procedures (including critical care time) 77 year old male, with known coronary artery disease complains of right-sided chest pain, radiating into his right arm, which is consistent with his prior heart attacks.  His EKG does not show a STEMI and his cardiac enzymes do not show damage to his heart. Will consult cards for unstable angina.  Labs Reviewed  CBC - Abnormal; Notable for the following:    Platelets 145 (*)    All other components within normal limits  BASIC METABOLIC PANEL - Abnormal; Notable for the following:    Glucose, Bld 176 (*)    GFR calc non Af Amer 85 (*)    All other components within normal limits  POCT I-STAT, CHEM 8 - Abnormal; Notable for the following:    Glucose, Bld 173 (*)    All other components within normal  limits  PROTIME-INR - Abnormal; Notable for the following:    Prothrombin Time 20.6 (*)    INR 1.73 (*)    All other components within normal limits  CARDIAC PANEL(CRET KIN+CKTOT+MB+TROPI) - Abnormal; Notable for the following:    CK, MB 4.4 (*)    All other components within normal  limits  I-STAT TROPONIN I  I-STAT TROPONIN I   Chest Portable 1 View  08/13/2011  *RADIOLOGY REPORT*  Clinical Data: Generalized chest pain.  PORTABLE CHEST - 1 VIEW  Comparison: Chest radiograph performed 12/18/2010  Findings: The lungs are well-aerated.  Mild scarring is noted at the right lung base.  There is no evidence of focal opacification, pleural effusion or pneumothorax.  The cardiomediastinal silhouette is borderline normal in size; calcification is noted within the aortic arch.  A pacemaker is noted overlying the left chest wall, with a single lead ending overlying the right ventricle.  No acute osseous abnormalities are seen.  IMPRESSION: No acute cardiopulmonary process seen.  Original Report Authenticated By: Tonia Ghent, M.D.     6:30 AM Chest pain resolved  6:56 AM Spoke with Dr. Elease Hashimoto.  He will come and evaluate the patient for chest pain, with negative cardiac enzymes, and no STEMI.   MDM  Chest pain        Nicholes Stairs, MD 08/13/11 [phone]

## 2011-08-13 NOTE — ED Notes (Signed)
Call from cath lab to bring patient stat, RN unaware that pt going to cath lab, when asked if MDs discussed procedure with patient the stated no, pants and shorts removed, placed on zoll, cath lab will obtain consent upstairs

## 2011-08-13 NOTE — H&P (Signed)
History and Physical   Patient ID: [name] MRN: [id number], DOB/AGE: November 25, 1927   Admit date: 08/13/2011 Date of Consult: 08/13/2011   Primary Physician: Kari Baars, MD, MD Primary Cardiologist: Lewayne Bunting, MD   Pt. Profile: [name] is a 77 yo Caucasian male with PMHx significant for CAD (s/p PTCA to distal LAD 09/2009 and diffuse nonobstructive disease; prior cath. 06/2007 revealed diffuse, nonobstructive CAD, medically managed), atrial fibrillation (s/p St. Jude single-chamber PM insertion 04/12 for tachy.-brady. syndrome, on Coumadin), chronic unspecified CHF (EF 25% per most recent cath, ECHO in 08/2010 revealed EF 50-55% and grade 1 diastolic dysfunction), HL, tobacco abuse, family history of CAD, AAA and COPD (stage IV) who presents to [facility] ED with chest pain.   Problem List: Past Medical History  Diagnosis Date  . Pneumonia 12/06/10    healthcare-associated/ left lower lobe  . COPD (chronic obstructive pulmonary disease)     severe stage IV  . Atrial fibrillation     on Coumadin with St. Jude single-chamber pacemaker  . Diabetes mellitus     type 2; s/p Prednisone therapy for pneumonia 12/2010  . Coronary artery disease     s/p anterior STEMI 09/2009; cath. revealed mid LAD 40%, distal LAD 100%- PTCA, prox. RCA 40%, mid. RCA 100% with good collateral filling of PDA; EF 25%  . ST elevation (STEMI) myocardial infarction 01//11/12    anterior  . CHF (congestive heart failure)     EF 25% on 09/26/10; EF 50-55% and grade 1 diastolic dysfunction on ECHO 08/2010   . Hypertension   . Hyperlipidemia   . AAA (abdominal aortic aneurysm)     3 cm infrarenal abdominal aortic aneurysm per aorta ultrasound 03/2010  . Osteoarthritis     s/p bilat hip arthroplasty    Past Surgical History  Procedure Date  . Hip arthroplasty     s/p right 09/27/10 and s/p left 09/24/10  . Back surgery   . Knee surgery   . Pacemaker insertion 12/2010    St. Jude single-chamber   .  Cardiac catheterization 09/2009, 06/2007    PTCA to distal LAD     Allergies:  Allergies  Allergen Reactions  . Morphine Itching    HPI:   He reports experiencing chest pain at midnight this morning radiating to right side, shoulder and back described as sharp, constant and rated at a 7/10. He has experienced intermittent chest pain for the past several months that is typically relieved with Tums and Rolaids per patient. Pain was unrelieved with this regimen today. He proceeded to take Nitro SL x 2 without relief and called EMS. He reports associated nausea and abdominal pain described as "indigestion." He relates this pain to previous MIs. He reports some DOE at baseline. He denies vomiting, dizziness, syncope, palpitations, diaphoresis, increased swelling, orthopnea, PND, fevers, chills, sick contacts and extended travel.   He was transported to [facility] ED and given Nitro SL x 1 in transit without relief. Upon arrival to the ED, EKG reveals atrial fibrillation with RVR at 126 bpm, unchanged ST-T wave changes from previous EKG in 04/12 and multiple PVCs; CXR revealed R lung base scarring, nl cardiac silhouette, no acute cardiopulmonary process. CEs neg. X 1. He was given zofran for nausea with relief; morphine, pain was reduced to 0-1/10.  Inpatient Medications:     .  morphine injection  4 mg Intravenous Once  . ondansetron  4 mg Intravenous Once    Outpatient Medications:  Bisoprolol  5mg   Daily Digoxin 0.25mg   Daily Cardizem  360mg  Daily Lasix  40mg   Daily Imdur  60mg  Daily Nitrogycerin 0.4mg  SL  Daily Crestor  20mg   Daily Coumadin  5-7.5 mg  Taking 5mg  M-Sa; 7.5mg  Su Potassium gluconate  550mg   Daily Oxycodone  5mg   q4h  PRN back pain Pulmicort   Neb  BID PRN  Atrovent  0.02%  Neb  4x/daily Vitamin D3  2000U  daily Senokot-S  8.6-50mg   2 tabs qhs   Family History  Problem Relation Age of Onset  . Heart attack Mother 9  . Heart attack Father 22     History    Social History  . Marital Status: Widowed    Spouse Name: N/A    Number of Children: N/A  . Years of Education: N/A   Occupational History  . Not on file.   Social History Main Topics  . Smoking status: Former Smoker -- 1.5 packs/day for 50 years    Types: Cigarettes    Quit date: 09/02/2009  . Smokeless tobacco: Never Used   Comment: smoked for 56yrs quit for 53yrs started back & smoked for 10ys  . Alcohol Use: No     occ - alcohol  . Drug Use: No  . Sexually Active: Not on file   Other Topics Concern  . Not on file   Social History Narrative  . No narrative on file     Review of Systems: General: negative for chills, fever, night sweats  Cardiovascular: positive for chest pain and dyspnea on exertion, negative for edema, orthopnea, palpitations, paroxysmal nocturnal dyspnea or shortness of breath Dermatological: negative for rash Respiratory: positive for cough and wheezing (in morning) Urologic: negative for hematuria Abdominal: negative for nausea, vomiting, diarrhea, bright red blood per rectum, melena, or hematemesis Neurologic: negative for visual changes, syncope, or dizziness All other systems reviewed and are otherwise negative except as noted above.  Physical Exam: Blood pressure 123/60, pulse 88, temperature 97.7 F (36.5 C), temperature source Oral, resp. rate 20, SpO2 98.00%.   General: Well developed, well nourished, NAD Head: Normocephalic, atraumatic, sclera non-icteric  Neck: Negative for carotid bruits. JVD mildly elevated. Lungs: Breathing is unlabored. Expiratory wheeze in central, lower lung fields, no rales or rhonchi Heart: irregularly irregular with clear S1 S2. No murmurs, rubs, or gallops appreciated. Abdomen: Soft, non-tender, non-distended with normoactive bowel sounds. No hepatomegaly. No rebound/guarding. No obvious abdominal masses. Msk:  Strength and tone appears normal for age. Extremities: No clubbing, cyanosis or edema.  Distal  pedal pulses are 2+ and equal bilaterally. Femoral, radial pulses 2+, no bruits Neuro: Alert and oriented X 3. Moves all extremities spontaneously. Psych:  Responds to questions appropriately with a normal affect.  Labs:   Lab Results  Component Value Date   WBC 9.9 08/13/2011   HGB 14.6 08/13/2011   HCT 43.0 08/13/2011   MCV 92.2 08/13/2011   PLT 145* 08/13/2011     Lab 08/13/11 0432 08/13/11 0422  NA 140 --  K 4.0 --  CL 100 --  CO2 -- 28  BUN 22 --  CREATININE 0.80 --  CALCIUM -- 9.8  PROT -- --  BILITOT -- --  ALKPHOS -- --  ALT -- --  AST -- --  GLUCOSE 173* --   Lab Results  Component Value Date   CKTOTAL 53 08/13/2011   CKMB 4.4* 08/13/2011   TROPONINI <0.30 08/13/2011   Lab Results  Component Value Date   CHOL 153 12/26/2010  Lab Results  Component Value Date   HDL 44.10 12/26/2010             Lab Results  Component Value Date   LDLCALC 83 12/26/2010             Lab Results  Component Value Date   TRIG 130.0 12/26/2010             Lab Results  Component Value Date   CHOLHDL 3 12/26/2010              No results found for this basename: DDIMER     Radiology/Studies: Chest Portable 1 View  08/13/2011  *RADIOLOGY REPORT*  Clinical Data: Generalized chest pain.  PORTABLE CHEST - 1 VIEW  Comparison: Chest radiograph performed 12/18/2010  Findings: The lungs are well-aerated.  Mild scarring is noted at the right lung base.  There is no evidence of focal opacification, pleural effusion or pneumothorax.  The cardiomediastinal silhouette is borderline normal in size; calcification is noted within the aortic arch.  A pacemaker is noted overlying the left chest wall, with a single lead ending overlying the right ventricle.  No acute osseous abnormalities are seen.  IMPRESSION: No acute cardiopulmonary process seen.  Original Report Authenticated By: Tonia Ghent, M.D.    EKG: atrial fibrillation with RVR at 126 bpm, multiple PVCs,  unchanged ST depressions in V5, V6 unchanged from previous EKG  ASSESSMENT AND PLAN:  [name] is a 77 yo Caucasian male with a significant cardiac history including CAD, atrial fibrillation and unspecified CHF as well as cardiac risk factors who presents to [facility] ED via EMS with several hours of chest pain unrelieved by Nitro SL x 3.  1. ACS/unstable angina- pt with significant cardiac history, risk factors and work-up in the past. CE neg. X 1, no acute EKG changes, pt asymptomatic currently, has not ruled in; will admit with ACS orders to telemetry, cycle enzymes, plan for cath vs stress. INR 1.73, would like <1.5 for cath, consider vitamin K if cath preferred. Pt with only a mild thrombocytopenia at 145.   2. Atrial fibrillation with RVR- likely secondary to COPD, currently rate controlled at 85-94 bpm.  Rate-control on Cardizem. INR subtherapeutic. Will depend on diagnostic evaluation to continue Coumadin or start on ASA.  3. Unspecified CHF- not remarkably fluid overloaded on exam, audible wheezes on exam, CXR clear, will order BNP and ECHO 4. AAA- repeat abdominal aortic ultrasound as most recent was 03/2010 5. COPD- audible wheezes on exam, not currently dyspneic, afebrile, CXR clear, will continue outpatient Pulmicort and Atrovent 6. HL- continue outpatient Crestor    Signed, Hurman Horn, PA-C 08/13/2011, 9:26 AM   Attending Note:   The patient was seen and examined.  Agree with assessment and plan as noted above.  His CP is atypical - but  His angina has been atypical in the past.  Will admit, check enzymes, consider cardiac cath in the AM/  Significant wheezing on exam - has not had nebs yet.   Vesta Mixer, Montez Hageman., MD, [facility] 08/13/2011, 9:52 AM

## 2011-08-13 NOTE — Procedures (Signed)
Cardiac Catheterization Procedure Note  Name: [name] MRN: [id number] DOB: 03-12-1928  Procedure: Left Heart Cath, Selective Coronary Angiography, LV angiography,  PTCA/Stent of the mid left circumflex  Indication: 77 year-old gentleman presented with NSTEMI, ongoing chest pain, positive cardiac markers.   Diagnostic Procedure Details: The right groin was prepped, draped, and anesthetized with 1% lidocaine. Using the modified Seldinger technique, a 5 French sheath was introduced into the right femoral artery. Standard Judkins catheters were used for selective coronary angiography and left ventriculography. Catheter exchanges were performed over a wire.  The diagnostic procedure was well-tolerated without immediate complications.  PROCEDURAL FINDINGS Hemodynamics: AO 119/56 LV 119/12  Coronary angiography: Coronary dominance: right  Left mainstem: Widely patent  Left anterior descending (LAD): Patent to the LV apex. The apical portion of the LAD fills slowly. There is diffuse nonobstructive disease present.   Left circumflex (LCx): Total occlusion in the mid-LCx with TIMI-0 flow just after the first OM  Right coronary artery (RCA): Chronic total occlusion. Fills via left to right collaterals. Unchanged from previous study in 2011.  Left ventriculography: Dense area of inferior wall akinesis, other segments contract normally. LVEF estimated at 35%  PCI Procedure Note:  Following the diagnostic procedure, the decision was made to proceed with PCI. The sheath was upsized to a 6 [place]. Weight-based bivalirudin was given for anticoagulation. Once a therapeutic ACT was achieved, a 6 [place] XB 3.5 guide catheter was inserted.  A Cougar coronary guidewire was used to cross the lesion.  The lesion was predilated with a 2.0 x 20 mm balloon. This restored TIMI-3 flow in the vessel. I attempted to pass a stent at that point but it would not cross the lesion. The lesion was then redilated with  a 2.25 x 20 mm [place] Apex balloon to 16 atm.  The lesion was then stented with a 2.5 x 28 mm Promus Element drug-eluting stent.  The stent was postdilated with a 2.75 x 20 mm noncompliant balloon to 14 atm.  Following PCI, there was 0% residual stenosis and TIMI-3 flow. Final angiography confirmed an excellent result. Femoral hemostasis was achieved with a perclose device.  The patient tolerated the PCI procedure well. There were no immediate procedural complications.  The patient was transferred to the post catheterization recovery area for further monitoring.  Final Conclusions:   1. Total occlusion of the LCx with successful PCI using a DES platform 2. Chronic total occlusion of the RCA with left-right collaterals 3. Nonobstructive LAD stenosis 4. Moderate segmental LV systolic dysfunction  Recommendations: Plavix/Coumadin x 6 months. No aspirin.  Tonny Bollman 08/13/2011, 12:47 PM

## 2011-08-13 NOTE — ED Notes (Signed)
MD at bedside. 

## 2011-08-13 NOTE — Progress Notes (Signed)
ANTICOAGULATION CONSULT NOTE - Initial Consult  Pharmacy Consult for heparin Indication: chest pain/ACS and atrial fibrillation  Allergies  Allergen Reactions  . Morphine Itching    Vital Signs: Temp: 97.9 F (36.6 C) (11/27 1047) Temp src: Oral (11/27 1047) BP: 120/58 mmHg (11/27 1047) Pulse Rate: 63  (11/27 1047)  Labs:  Basename 08/13/11 0454 08/13/11 0432 08/13/11 0422  HGB -- 14.6 14.6  HCT -- 43.0 43.6  PLT -- -- 145*  APTT -- -- --  LABPROT 20.6* -- --  INR 1.73* -- --  HEPARINUNFRC -- -- --  CREATININE -- 0.80 0.72  CKTOTAL -- -- 53  CKMB -- -- 4.4*  TROPONINI -- -- <0.30   The CrCl is unknown because both a height and weight (above a minimum accepted value) are required for this calculation.  Medical History: Past Medical History  Diagnosis Date  . Pneumonia 12/06/10    healthcare-associated/ left lower lobe  . COPD (chronic obstructive pulmonary disease)     severe stage IV  . Atrial fibrillation     on Coumadin with St. Jude single-chamber pacemaker  . Diabetes mellitus     type 2; s/p Prednisone therapy for pneumonia 12/2010  . Coronary artery disease     s/p anterior STEMI 09/2009; cath. revealed mid LAD 40%, distal LAD 100%- PTCA, prox. RCA 40%, mid. RCA 100% with good collateral filling of PDA; EF 25%  . ST elevation (STEMI) myocardial infarction 01//11/12    anterior  . CHF (congestive heart failure)     EF 25% on 09/26/10; EF 50-55% and grade 1 diastolic dysfunction on ECHO 08/2010   . Hypertension   . Hyperlipidemia   . AAA (abdominal aortic aneurysm)     3 cm infrarenal abdominal aortic aneurysm per aorta ultrasound 03/2010  . Osteoarthritis     s/p bilat hip arthroplasty    Medications:  Pending  Assessment: 83 yom on coumadin PTA for afib presents to the ED with chest pain. Starting IV heparin for ACS/CP. Will need cath but INR is 1.73 (goal <1.5 for cath). Since INR is elevated but <2, will start heparin without bolus.   Goal of  Therapy:  Heparin level 0.3-0.7 units/ml   Plan:  Heparin gtt 900units/hr (39ml/hr) Check a heparin level 8 hour after drip started Daily heparin level and CBC F/u cath plans F/u restart of coumadin  Torria Fromer, Drake Leach 08/13/2011,10:54 AM

## 2011-08-13 NOTE — ED Notes (Signed)
Started last night; pain going down right side; no nausea, diaphoresis; no SOB ; MI x 2; a fib; pacemaker; 2 hip replacements.

## 2011-08-13 NOTE — ED Notes (Signed)
MD aware and has discussed pt's reaction to morphine when last taken.

## 2011-08-13 NOTE — Progress Notes (Signed)
Order to restart coumadin tonight and not start heparin. Since INR is slightly low at 1.73, will restart a slightly higher dose than he would normally get today. Normal dose today would be 5mg , will give coumadin 7.5mg  PO x 1 and check a daily PT/INR  [name], Drake Leach 08/13/2011 2:17 PM

## 2011-08-13 NOTE — ED Notes (Signed)
Pt Daughter [name] going home  Home number [phone], cell  [phone]  PLEASE CALL WITH ROOM NUMBER

## 2011-08-14 ENCOUNTER — Other Ambulatory Visit: Payer: Self-pay

## 2011-08-14 ENCOUNTER — Encounter: Payer: Medicare Other | Admitting: *Deleted

## 2011-08-14 DIAGNOSIS — I214 Non-ST elevation (NSTEMI) myocardial infarction: Secondary | ICD-10-CM

## 2011-08-14 LAB — BASIC METABOLIC PANEL
Calcium: 8.8 mg/dL (ref 8.4–10.5)
GFR calc Af Amer: >90 mL/min (ref >90)
GFR calc non Af Amer: 84 mL/min — ABNORMAL LOW (ref >90)
Glucose, Bld: 130 mg/dL — ABNORMAL HIGH (ref 70–99)
Potassium: 4.6 mEq/L (ref 3.5–5.1)
Sodium: 139 mEq/L (ref 135–145)

## 2011-08-14 LAB — CBC
Hemoglobin: 13.6 g/dL (ref 13.0–17.0)
MCH: 30.6 pg (ref 26.0–34.0)
MCHC: 32.7 g/dL (ref 30.0–36.0)
Platelets: 125 10*3/uL — ABNORMAL LOW (ref 150–400)
RDW: 14.2 % (ref 11.5–15.5)

## 2011-08-14 LAB — CARDIAC PANEL(CRET KIN+CKTOT+MB+TROPI)
CK, MB: 47.6 ng/mL (ref 0.3–4.0)
Total CK: 475 U/L — ABNORMAL HIGH (ref 7–232)
Troponin I: 14.56 ng/mL (ref <0.30)

## 2011-08-14 LAB — GLUCOSE, CAPILLARY
Glucose-Capillary: 122 mg/dL — ABNORMAL HIGH (ref 70–99)
Glucose-Capillary: 177 mg/dL — ABNORMAL HIGH (ref 70–99)
Glucose-Capillary: 125 mg/dL — ABNORMAL HIGH (ref 70–99)

## 2011-08-14 LAB — PROTIME-INR: INR: 2.75 — ABNORMAL HIGH (ref 0.00–1.49)

## 2011-08-14 MED ORDER — WARFARIN SODIUM 2.5 MG PO TABS
2.5000 mg | ORAL_TABLET | Freq: Once | ORAL | Status: AC
  Administered 2011-08-14: 2.5 mg via ORAL
  Filled 2011-08-14: qty 1

## 2011-08-14 NOTE — Progress Notes (Signed)
ANTICOAGULATION CONSULT NOTE - Follow Up Consult  Pharmacy Consult for coumadin Indication: atrial fibrillation  Allergies  Allergen Reactions  . Morphine Itching  Labs:  Basename 08/14/11 0420 08/13/11 1733 08/13/11 1417 08/13/11 0454 08/13/11 0432 08/13/11 0422  HGB 13.6 -- -- -- 14.6 --  HCT 41.6 -- -- -- 43.0 43.6  PLT 125* -- -- -- -- 145*  APTT -- 63* -- -- -- --  LABPROT 29.5* -- -- 20.6* -- --  INR 2.75* -- -- 1.73* -- --  HEPARINUNFRC -- -- -- -- -- --  CREATININE 0.75 -- -- -- 0.80 0.72  CKTOTAL 475* 1014* 951* -- -- --  CKMB 47.6* 135.5* 128.5* -- -- --  TROPONINI 14.56* >25.00* >25.00* -- -- --   Estimated Creatinine Clearance: 72.4 ml/min (by C-G formula based on Cr of 0.75).  Assessment: INR tx.  Large ~ 9 sec jump in protime.  Home dose is 5mg  daily x 7.5mg  on Sundays. Got 7.5mg  yesterday. No bleeding reported. Goal of Therapy:  INR 2-3   Plan: coumadin 2.5mg  x 1 dose today.   Len Childs T 08/14/2011,10:52 AM

## 2011-08-14 NOTE — Progress Notes (Signed)
CARDIAC REHAB PHASE I   PRE:  Rate/Rhythm: 77 Pacing A fib  BP:  Supine: 105/78  Sitting:   Standing:    SaO2: 97 2L 94 RA  MODE:  Ambulation: 340 ft   POST:  Rate/Rhythem: 102 Pacing Afib  BP:  Supine:   Sitting: 195/68  Standing:    SaO2: 92 RA 0800-0915 Tolerated ambulation well using walker. Gait steady. He has congested cough and sputum production on walk and is DOE. Pt without c/o during walk. Completed MI education with pt. He agrees to [employer]. CRP in [place], will send referral.  Beatrix Fetters

## 2011-08-14 NOTE — Progress Notes (Signed)
Subjective:  Feels better today. A little shortness of breath this morning better after a neb treatment. No further chest pain.  Objective:  Vital Signs in the last 24 hours: Temp:  [97.5 F (36.4 C)-99.5 F (37.5 C)] 98.3 F (36.8 C) (11/28 0753) Pulse Rate:  [26-94] 70  (11/28 0753) Resp:  [17-22] 18  (11/28 0753) BP: (105-133)/(51-83) 105/68 mmHg (11/28 0753) SpO2:  [91 %-99 %] 97 % (11/28 0753) FiO2 (%):  [2 %] 2 % (11/28 0753) Weight:  [80.6 kg (177 lb 11.1 oz)] 177 lb 11.1 oz (80.6 kg) (11/28 1610)  Intake/Output from previous day: 11/27 0701 - 11/28 0700 In: 896.7 [P.O.:240; I.V.:656.7] Out: 700 [Urine:700]  Physical Exam: Pt is alert and oriented, NAD HEENT: normal Neck: JVP - normal, carotids 2+= without bruits Lungs: CTA bilaterally with prolonged expiratory phase CV: RRR without murmur or gallop Abd: soft, NT, Positive BS, no hepatomegaly Ext: no C/C/E, distal pulses intact and equal R groin site clear Skin: warm/dry no rash   Lab Results:  Basename 08/14/11 0420 08/13/11 0432 08/13/11 0422  WBC 8.9 -- 9.9  HGB 13.6 14.6 --  PLT 125* -- 145*    Basename 08/14/11 0420 08/13/11 0432 08/13/11 0422  NA 139 140 --  K 4.6 4.0 --  CL 105 100 --  CO2 25 -- 28  GLUCOSE 130* 173* --  BUN 16 22 --  CREATININE 0.75 0.80 --    Basename 08/14/11 0420 08/13/11 1733  TROPONINI 14.56* >25.00*    Tele: Atrial fib, demand pacing  Assessment/Plan:  1. NSTEMI - secondary to acute occlusion of the LCx - successful treatment with PCI (drug-eluting stent). The patient will be treated with 6 months of plavix. He is on chronic coumadin for atrial fib. Continue crestor and metoprolol. 2. Atrial fib - therapeutic on warfarin. Rate controlled on combination of dilt/dig/metoprolol. 3. Dispo - consider large infarct by enzymes, favor keep today and work with cardiac rehab. Elderly patient who lives alone. Plan discharge home tomorrow.   Tonny Bollman, M.D. 08/14/2011,  7:56 AM

## 2011-08-15 ENCOUNTER — Encounter (HOSPITAL_COMMUNITY): Payer: Self-pay | Admitting: Nurse Practitioner

## 2011-08-15 ENCOUNTER — Other Ambulatory Visit: Payer: Self-pay | Admitting: *Deleted

## 2011-08-15 ENCOUNTER — Other Ambulatory Visit: Payer: Self-pay

## 2011-08-15 DIAGNOSIS — I214 Non-ST elevation (NSTEMI) myocardial infarction: Secondary | ICD-10-CM

## 2011-08-15 DIAGNOSIS — I5031 Acute diastolic (congestive) heart failure: Secondary | ICD-10-CM

## 2011-08-15 DIAGNOSIS — I2510 Atherosclerotic heart disease of native coronary artery without angina pectoris: Secondary | ICD-10-CM

## 2011-08-15 LAB — GLUCOSE, CAPILLARY
Glucose-Capillary: 124 mg/dL — ABNORMAL HIGH (ref 70–99)
Glucose-Capillary: 96 mg/dL (ref 70–99)

## 2011-08-15 LAB — CBC
Hemoglobin: 13 g/dL (ref 13.0–17.0)
RBC: 4.35 MIL/uL (ref 4.22–5.81)
WBC: 6.8 10*3/uL (ref 4.0–10.5)

## 2011-08-15 LAB — BASIC METABOLIC PANEL
Chloride: 100 mEq/L (ref 96–112)
Creatinine, Ser: 0.79 mg/dL (ref 0.50–1.35)
GFR calc Af Amer: >90 mL/min (ref >90)
Potassium: 3.7 mEq/L (ref 3.5–5.1)
Sodium: 137 mEq/L (ref 135–145)

## 2011-08-15 LAB — PROTIME-INR
INR: 2.99 — ABNORMAL HIGH (ref 0.00–1.49)
Prothrombin Time: 31.5 seconds — ABNORMAL HIGH (ref 11.6–15.2)

## 2011-08-15 LAB — LIPID PANEL: Total CHOL/HDL Ratio: 4 RATIO

## 2011-08-15 MED ORDER — ROSUVASTATIN CALCIUM 20 MG PO TABS: 40.0000 mg | ORAL_TABLET | Freq: Every day | ORAL | Status: DC

## 2011-08-15 MED ORDER — LISINOPRIL 2.5 MG PO TABS
2.5000 mg | ORAL_TABLET | Freq: Every day | ORAL | Status: DC
  Administered 2011-08-15: 2.5 mg via ORAL
  Filled 2011-08-15 (×2): qty 1

## 2011-08-15 MED ORDER — NITROGLYCERIN 0.4 MG SL SUBL: 0.4000 mg | SUBLINGUAL_TABLET | SUBLINGUAL | Status: DC | PRN

## 2011-08-15 MED ORDER — CLOPIDOGREL BISULFATE 75 MG PO TABS: 75.0000 mg | ORAL_TABLET | Freq: Every day | ORAL | Status: DC

## 2011-08-15 MED ORDER — LISINOPRIL 2.5 MG PO TABS: 2.5000 mg | ORAL_TABLET | Freq: Every day | ORAL | Status: DC

## 2011-08-15 MED FILL — Sodium Chloride IV Soln 0.9%: INTRAVENOUS | Qty: 50 | Status: AC

## 2011-08-15 NOTE — Progress Notes (Signed)
Clinical Social Worker was consulted to speak with patient about assisted living options. CSW provided patient with a Designer, industrial/product booklet and an assisted living facility packet. Patient was appreciative of these resources. CSW will sign off for now as social work intervention is no longer needed. Please consult [place] again if new needs arises.   Rozetta Nunnery MSW, [employer] [phone]

## 2011-08-15 NOTE — Discharge Summary (Signed)
Patient ID: [name],  MRN: [id number], DOB/AGE: 04/14/1928 77 y.o.  Admit date: 08/13/2011 Discharge date: 08/15/2011  Primary Care Provider: Buren Kos Primary Cardiologist: Lewayne Bunting  Discharge Diagnoses Principal Problem:  *NSTEMI (non-ST elevated myocardial infarction) Active Problems:  CAD (coronary artery disease)  Atrial fibrillation  CHF (congestive heart failure)  Diabetes mellitus  Abdominal aortic aneurysm  COPD, severe  Pacemaker  Unstable angina   Allergies Allergies  Allergen Reactions  . Morphine Itching    Procedures  08/13/2011 - Cardiac Cath/PCI Coronary angiography:  Coronary dominance: right  Left mainstem: Widely patent  Left anterior descending (LAD): Patent to the LV apex. The apical portion of the LAD fills slowly. There is diffuse nonobstructive disease present.  Left circumflex (LCx): Total occlusion in the mid-LCx with TIMI-0 flow just after the first OM  Right coronary artery (RCA): Chronic total occlusion. Fills via left to right collaterals. Unchanged from previous study in 2011.  Left ventriculography: Dense area of inferior wall akinesis, other segments contract normally. LVEF estimated at 35% **The LCX was successfully stented with a 2.5 x 28 mm Promus Element drug-eluting stent**   History of Present Illness  77 y/o male w h/o CAD and a.fib on chronic coumadin therapy who presented to the ED on 11/27 with complaints of chest pain.  In the ED, initial point of care troponins were negative but subsequently returned elevated.  Pt was admitted for further eval and mgmt of NSTEMI.  Hospital Course   Pt had no further c/p but eventually peaked his Troponin @ >25.0.  He was placed on heparin and IV NTG and was taken to the cath lab on the afternoon of admission.  Diagnostic cath revealed a new total occlusion of the left circumflex with a known chronic total occlusion of the RCA.  Attention was turned to the LCX and this was  successfully stented with a 2.5 x 28mm Promus DES.  Post procedure, patient had no further c/p or dyspnea.  He has been seen by cardiac rehab and has ambulated without difficulty.  His coumadin was resumed post PCI and b/c he is on chronic coumadin, and now plavix, we will not be using Aspirin @ this time. We plan to d/c him home today in good condition.  Of note, patient's EF was found to be 35% by ventriculography and we have added low-dose Ace Inhibitor therapy with a plan to f/u his bmet in 1 week.  Discharge Vitals:  Blood pressure 127/84, pulse 81, temperature 98.1 F (36.7 C), temperature source Oral, resp. rate 18, height 5\' 9"  (1.753 m), weight 176 lb 5.9 oz (80 kg), SpO2 99.00%.   Labs: CBC:  Basename 08/15/11 0422 08/14/11 0420  WBC 6.8 8.9  NEUTROABS -- --  HGB 13.0 13.6  HCT 40.0 41.6  MCV 92.0 93.7  PLT 113* 125*   Basic Metabolic Panel:  Basename 08/15/11 0422 08/14/11 0420  NA 137 139  K 3.7 4.6  CL 100 105  CO2 26 25  GLUCOSE 119* 130*  BUN 17 16  CREATININE 0.79 0.75  CALCIUM 8.8 8.8  MG -- --  PHOS -- --   Cardiac Enzymes:  Basename 08/14/11 0420 08/13/11 1733 08/13/11 1417  CKTOTAL 475* 1014* 951*  CKMB 47.6* 135.5* 128.5*  CKMBINDEX -- -- --  TROPONINI 14.56* >25.00* >25.00*  Hemoglobin A1C:  Basename 08/13/11 1733  HGBA1C 7.0*   Fasting Lipid Panel:  Basename 08/15/11 0422  CHOL 128  HDL 32*  LDLCALC 67  TRIG  146  CHOLHDL 4.0  LDLDIRECT --   Thyroid Function Tests:  Basename 08/13/11 1733  TSH 1.481  T4TOTAL --  T3FREE --  THYROIDAB --    Disposition:  Follow-up Information    Follow up with Tereso Newcomer, PA on 08/27/2011. (3:30)    Contact information:   [facility] [address] [place], [place]      Follow up with Kari Baars, MD. (As needed)    Contact information:   [address], [place]. [place] [address] [phone]       Follow up with [facility]  on 08/22/2011. (For Blood Chemistry and Blood Count @ time of Coumadin Clinic visit)    Contact information:   [address] [place] [address]       Follow up with [facility] on 08/22/2011. (9:00)          Discharge Medications: Current Discharge Medication List    START taking these medications   Details  clopidogrel (PLAVIX) 75 MG tablet Take 1 tablet (75 mg total) by mouth daily with breakfast. Qty: 30 tablet, Refills: 6    lisinopril (PRINIVIL,ZESTRIL) 2.5 MG tablet Take 1 tablet (2.5 mg total) by mouth daily. Qty: 30 tablet, Refills: 6      CONTINUE these medications which have CHANGED   Details  nitroGLYCERIN (NITROSTAT) 0.4 MG SL tablet Place 1 tablet (0.4 mg total) under the tongue every 5 (five) minutes as needed. Qty: 25 tablet, Refills: 3    rosuvastatin (CRESTOR) 20 MG tablet Take 2 tablets (40 mg total) by mouth daily. Qty: 30 tablet, Refills: 6      CONTINUE these medications which have NOT CHANGED   Details  bisoprolol (ZEBETA) 5 MG tablet Take 5 mg by mouth daily.      budesonide (PULMICORT) 0.5 MG/2ML nebulizer solution Take 2 mLs (0.5 mg total) by nebulization 2 (two) times daily as needed. Qty: 60 mL, Refills: 6    Cholecalciferol (VITAMIN D3) 2000 UNITS TABS Take by mouth.      digoxin (LANOXIN) 0.25 MG tablet Take 250 mcg by mouth daily.      diltiazem (CARDIZEM CD) 360 MG 24 hr capsule Take 360 mg by mouth daily.      furosemide (LASIX) 40 MG tablet Take 40 mg by mouth daily.      ipratropium (ATROVENT) 0.02 % nebulizer solution Take 2.5 mLs (500 mcg total) by nebulization 4 (four) times daily. Qty: 300 mL, Refills: 6    isosorbide mononitrate (IMDUR) 60 MG 24 hr tablet Take 60 mg by mouth daily.      oxycodone (OXY-IR) 5 MG capsule Take 5 mg by mouth every 4 (four) hours as needed. For pain    Potassium Gluconate 550 MG TABS Take 1 tablet by mouth daily.      sennosides-docusate sodium  (SENOKOT-S) 8.6-50 MG tablet Take 2 tablets by mouth at bedtime.     warfarin (COUMADIN) 5 MG tablet Take 5-7.5 mg by mouth daily. Take 7.5mg  on Sunday and take 5mg  on all other days          Outstanding Labs/Studies:  BMET in 1 wk.  Duration of Discharge Encounter: Greater than 30 minutes including physician time.  Signed, Nicolasa Ducking NP 08/15/2011, 9:36 AM

## 2011-08-15 NOTE — Progress Notes (Addendum)
Patient Name: [name] Date of Encounter: 08/15/2011     Principal Problem:  *NSTEMI (non-ST elevated myocardial infarction) Active Problems:  CAD (coronary artery disease)  Atrial fibrillation  CHF (congestive heart failure)  Diabetes mellitus  Abdominal aortic aneurysm  COPD, severe  Pacemaker  Unstable angina    SUBJECTIVE:  No c/p, sob, groin complaints.  Looking forward to going home.   CURRENT MEDS    . cholecalciferol  2,000 Units Oral Daily  . clopidogrel  75 mg Oral Q breakfast  . digoxin  250 mcg Oral Daily  . diltiazem  360 mg Oral Daily  . furosemide  40 mg Oral Daily  . insulin aspart  0-15 Units Subcutaneous TID WC  . insulin aspart  0-5 Units Subcutaneous QHS  . ipratropium  500 mcg Nebulization QID  . isosorbide mononitrate  60 mg Oral Daily  . metoprolol tartrate  12.5 mg Oral BID  . rosuvastatin  40 mg Oral Daily  . senna-docusate  2 tablet Oral QHS  . sodium chloride  3 mL Intravenous Q12H  . warfarin  2.5 mg Oral ONCE-1800  . DISCONTD: aspirin EC  81 mg Oral Daily    OBJECTIVE  Filed Vitals:   08/14/11 2142 08/14/11 2242 08/14/11 2359 08/15/11 0559  BP:  119/62 110/57 120/69  Pulse:  93 83 85  Temp:   98.1 F (36.7 C) 98 F (36.7 C)  TempSrc:   Oral Oral  Resp:   18 20  Height:      Weight:    176 lb 5.9 oz (80 kg)  SpO2: 93%  95% 95%    Intake/Output Summary (Last 24 hours) at 08/15/11 0654 Last data filed at 08/14/11 2336  Gross per 24 hour  Intake    723 ml  Output      0 ml  Net    723 ml   Weight change: -1 lb 5.2 oz (-0.6 kg)  PHYSICAL EXAM  General: Well developed, well nourished, in no acute distress. Head: Normocephalic, atraumatic, sclera non-icteric, no xanthomas, nares are without discharge.  Neck: Supple without bruits or JVD. Lungs:  Resp regular and unlabored, CTA. Heart: RRR no s3, s4, or murmurs. Abdomen: Soft, non-tender, non-distended, BS + x 4.  Msk:  Strength and tone appears normal for  age. Extremities: No clubbing, cyanosis or edema. DP/PT/Radials 2+ and equal bilaterally.  R groin w/o bleeding/bruits/hematoma. Neuro: Alert and oriented X 3. Moves all extremities spontaneously. Psych: Normal affect.  LABS:  CBC:  Basename 08/15/11 0422 08/14/11 0420  WBC 6.8 8.9  NEUTROABS -- --  HGB 13.0 13.6  HCT 40.0 41.6  MCV 92.0 93.7  PLT 113* 125*   Basic Metabolic Panel:  Basename 08/15/11 0422 08/14/11 0420  NA 137 139  K 3.7 4.6  CL 100 105  CO2 26 25  GLUCOSE 119* 130*  BUN 17 16  CREATININE 0.79 0.75  CALCIUM 8.8 8.8  MG -- --  PHOS -- --   Cardiac Enzymes:  Basename 08/14/11 0420 08/13/11 1733 08/13/11 1417  CKTOTAL 475* 1014* 951*  CKMB 47.6* 135.5* 128.5*  CKMBINDEX -- -- --  TROPONINI 14.56* >25.00* >25.00*   Hemoglobin A1C:  Basename 08/13/11 1733  HGBA1C 7.0*   Fasting Lipid Panel:  Basename 08/15/11 0422  CHOL 128  HDL 32*  LDLCALC 67  TRIG 161  CHOLHDL 4.0  LDLDIRECT --   Thyroid Function Tests:  Basename 08/13/11 1733  TSH 1.481  T4TOTAL --  T3FREE --  THYROIDAB --     TELE A.fib, pvc's, pod  ASSESSMENT AND PLAN:  1.  NSTEMI/CAD:  S/p pci/des lcx on 11/27.  No recurrent c/p, sob.  Ambulate this am w/ rehab.  Home later this am.  Cont plavix (6mos), BB, statin.  No asa 2/2 ongoing coumadin rx.  2. A.fib:  Rate controlled on bb/dig/dilt.  INR therapeutic.  3.  ICM:  Ef 35% by LV gram.  EF was 50-55% by echo in 08/2010.  Plan to repeat echo as outpt ~ 12wks post revasc.  Add low dose acei.  F/u bmet in 1 wk.  If ef remains low, will need to consider d/c of dilt.  4.  DM: On ssi here.  Sugars fair.  Diet controlled @ home.    Signed, Nicolasa Ducking NP  Patient seen, examined. Available data reviewed. Agree with findings, assessment, and plan as outlined by Ward Givens.  The patient is stable post-MI and ready for discharge. Meds reviewed and no changes recommended. Plan noted for 6 months of plavix in setting of  warfarin. No ASA secondary to bleeding risk with other agents.  Tonny Bollman, M.D.

## 2011-08-15 NOTE — Progress Notes (Signed)
CARDIAC REHAB PHASE I   PRE:  Rate/Rhythm: 77 Pacing Afib  BP:  Supine:   Sitting: 113/66  Standing:    SaO2: 97 RA  MODE:  Ambulation: 680 ft   POST:  Rate/Rhythem: 110 Pacing Afib  BP:  Supine:   Sitting: 121/88  Standing:    SaO2: 96 RA 0935-1000 Tolerated ambulation well using walker. Gait steady with walker, no c/o of SOB or cp. To side of bed after walk with call light in reach.  [name]

## 2011-08-16 NOTE — Discharge Summary (Signed)
Agree as documented

## 2011-08-22 ENCOUNTER — Other Ambulatory Visit (INDEPENDENT_AMBULATORY_CARE_PROVIDER_SITE_OTHER): Payer: Medicare Other | Admitting: *Deleted

## 2011-08-22 ENCOUNTER — Ambulatory Visit (INDEPENDENT_AMBULATORY_CARE_PROVIDER_SITE_OTHER): Payer: Medicare Other | Admitting: *Deleted

## 2011-08-22 DIAGNOSIS — E119 Type 2 diabetes mellitus without complications: Secondary | ICD-10-CM | POA: Insufficient documentation

## 2011-08-22 DIAGNOSIS — Z7901 Long term (current) use of anticoagulants: Secondary | ICD-10-CM | POA: Insufficient documentation

## 2011-08-22 DIAGNOSIS — Z5189 Encounter for other specified aftercare: Secondary | ICD-10-CM | POA: Insufficient documentation

## 2011-08-22 DIAGNOSIS — Z9861 Coronary angioplasty status: Secondary | ICD-10-CM | POA: Insufficient documentation

## 2011-08-22 DIAGNOSIS — I714 Abdominal aortic aneurysm, without rupture, unspecified: Secondary | ICD-10-CM | POA: Insufficient documentation

## 2011-08-22 DIAGNOSIS — I5031 Acute diastolic (congestive) heart failure: Secondary | ICD-10-CM

## 2011-08-22 DIAGNOSIS — I252 Old myocardial infarction: Secondary | ICD-10-CM | POA: Insufficient documentation

## 2011-08-22 DIAGNOSIS — I5022 Chronic systolic (congestive) heart failure: Secondary | ICD-10-CM | POA: Insufficient documentation

## 2011-08-22 DIAGNOSIS — J9610 Chronic respiratory failure, unspecified whether with hypoxia or hypercapnia: Secondary | ICD-10-CM | POA: Insufficient documentation

## 2011-08-22 DIAGNOSIS — I509 Heart failure, unspecified: Secondary | ICD-10-CM | POA: Insufficient documentation

## 2011-08-22 DIAGNOSIS — J449 Chronic obstructive pulmonary disease, unspecified: Secondary | ICD-10-CM | POA: Insufficient documentation

## 2011-08-22 DIAGNOSIS — J4489 Other specified chronic obstructive pulmonary disease: Secondary | ICD-10-CM | POA: Insufficient documentation

## 2011-08-22 DIAGNOSIS — I2510 Atherosclerotic heart disease of native coronary artery without angina pectoris: Secondary | ICD-10-CM | POA: Insufficient documentation

## 2011-08-22 DIAGNOSIS — E785 Hyperlipidemia, unspecified: Secondary | ICD-10-CM | POA: Insufficient documentation

## 2011-08-22 DIAGNOSIS — I4891 Unspecified atrial fibrillation: Secondary | ICD-10-CM | POA: Insufficient documentation

## 2011-08-22 DIAGNOSIS — Z96649 Presence of unspecified artificial hip joint: Secondary | ICD-10-CM | POA: Insufficient documentation

## 2011-08-22 DIAGNOSIS — I10 Essential (primary) hypertension: Secondary | ICD-10-CM | POA: Insufficient documentation

## 2011-08-22 LAB — BASIC METABOLIC PANEL WITH GFR
BUN: 17 mg/dL (ref 6–23)
CO2: 23 meq/L (ref 19–32)
Calcium: 9.2 mg/dL (ref 8.4–10.5)
Chloride: 108 meq/L (ref 96–112)
Creatinine, Ser: 0.8 mg/dL (ref 0.4–1.5)
GFR: 94.43 mL/min
Glucose, Bld: 123 mg/dL — ABNORMAL HIGH (ref 70–99)
Potassium: 4.4 meq/L (ref 3.5–5.1)
Sodium: 140 meq/L (ref 135–145)

## 2011-08-27 ENCOUNTER — Ambulatory Visit (INDEPENDENT_AMBULATORY_CARE_PROVIDER_SITE_OTHER): Payer: Medicare Other | Admitting: *Deleted

## 2011-08-27 ENCOUNTER — Ambulatory Visit (INDEPENDENT_AMBULATORY_CARE_PROVIDER_SITE_OTHER): Payer: Medicare Other | Admitting: Physician Assistant

## 2011-08-27 ENCOUNTER — Encounter: Payer: Self-pay | Admitting: Physician Assistant

## 2011-08-27 DIAGNOSIS — I509 Heart failure, unspecified: Secondary | ICD-10-CM

## 2011-08-27 DIAGNOSIS — J449 Chronic obstructive pulmonary disease, unspecified: Secondary | ICD-10-CM

## 2011-08-27 DIAGNOSIS — Z950 Presence of cardiac pacemaker: Secondary | ICD-10-CM

## 2011-08-27 DIAGNOSIS — I4891 Unspecified atrial fibrillation: Secondary | ICD-10-CM

## 2011-08-27 DIAGNOSIS — R0989 Other specified symptoms and signs involving the circulatory and respiratory systems: Secondary | ICD-10-CM

## 2011-08-27 DIAGNOSIS — R0600 Dyspnea, unspecified: Secondary | ICD-10-CM

## 2011-08-27 DIAGNOSIS — I2510 Atherosclerotic heart disease of native coronary artery without angina pectoris: Secondary | ICD-10-CM

## 2011-08-27 DIAGNOSIS — E785 Hyperlipidemia, unspecified: Secondary | ICD-10-CM

## 2011-08-27 NOTE — Patient Instructions (Addendum)
Your physician recommends that you return for lab work in: TODAY BNP, CBC W/DIFF, 786.09  You have been referred to CARDIAC REHAB, DX ISCHEMIC CARDIOMYOPATHY, 786.09 DYSPNEA  Your physician has requested that you have an echocardiogram DX ISCHEMIC CARDIOMYOPATHY. Echocardiography is a painless test that uses sound waves to create images of your heart. It provides your doctor with information about the size and shape of your heart and how well your heart's chambers and valves are working. This procedure takes approximately one hour. There are no restrictions for this procedure.   Your physician recommends that you schedule a follow-up appointment in: 6 WEEKS WITH DR. Ladona Ridgel PER SCOTT WEAVER, PA-C, ALSO YOU WILL HAVE A FASTING LIPID/LIVER PANEL THE SAME DAY YOU SEE DR. Ladona Ridgel.   Your physician has recommended you make the following change in your medication: STOP TAKING DILTIAZEM 360 AND START TAKING DILTIAZEM 240 DAILY

## 2011-08-27 NOTE — Assessment & Plan Note (Signed)
Coumadin managed in our Coumadin clinic.

## 2011-08-27 NOTE — Assessment & Plan Note (Signed)
No further chest pain.  Continue Plavix in addition to coumadin.  Continue statin.

## 2011-08-27 NOTE — Assessment & Plan Note (Signed)
Upon review of his device interrogation, he is V pacing 43% of the time.  I discussed this with Dr. Lewayne Bunting.  We will try to cut back on his diltiazem to see if this helps.  I will decrease his Diltiazem to 240 mg QD.

## 2011-08-27 NOTE — Assessment & Plan Note (Signed)
I suspect that this is multifactorial.  He is likely deconditioned secondary to myocardial infarction in the setting of advanced age and significant COPD.  However, he does have an ischemic cardiomyopathy with an ejection fraction of 35%.  He does not particularly look volume overloaded to me on exam.  I will check a BNP today.  If this is elevated, I will adjust his Lasix.  I will also have his pacemaker interrogated.  If his ventricle is paced the majority of the time, we may need to adjust his rate setting versus his medications to allow his intrinsic rate to be predominant.  We may need to consider discontinuing his diltiazem.  The plan at discharge was to reassess his ejection fraction 12 weeks post PCI to assess for recovery of his LV function prior to changing his cardizem.  I will also check a CBC to rule out anemia.

## 2011-08-27 NOTE — Assessment & Plan Note (Signed)
Dyspnea is likely related to COPD as well.  No change in cough or sputum production recently.

## 2011-08-27 NOTE — Progress Notes (Addendum)
[address] [place], [place]  [address] Phone: [phone] Fax:  [phone]  Date:  08/27/2011   Name:  [name]       DOB:  [birth year] MRN:  [id number]  PCP:  Dr. Buren Kos Primary Cardiologist:  Dr. Lewayne Bunting  Primary Electrophysiologist:  Dr. Lewayne Bunting    History of Present Illness: [name] is a 77 y.o. male who presents for post hospital follow up.  He has a history of CAD, status post prior anterior STEMI in 1/11 treated with PCI to the distal LAD chronically occluded RCA.  Other history includes atrial fibrillation, symptomatic bradycardia, status post single-chamber pacemaker, COPD, diabetes, systolic heart failure, ischemic cardiomyopathy, hypertension, hyperlipidemia, AAA, DJD.  Last echo 12/11: Mild focal basal hypertrophy of the septum, EF 50-55%, basal-mid inferior-posterior AK, grade 1 diastolic dysfunction, mild MR, moderate LAE and moderate RAE.   He was admitted 11/27-529 with a NSTEMI.  LHC 08/13/11:  mCFX occluded, chronic RCA occlusion, RCA fills L-R collats, EF 35%.  PCI:  Promus DES to CFX.  Coumadin was resumed post PCI.  Aspirin was discontinued due to the need for Plavix in the setting of recent drug-eluting stent due to acute coronary syndrome.  His ejection fraction was 35% by left ventriculogram.  Therefore, ACE inhibitor was added to his medical therapy.  Labs: Hemoglobin 13, potassium 3.7, creatinine 0.7, platelets 113,000, hemoglobin A1c 7, TC 128, HDL 32, LDL 67, TC 146, TSH 1.41.  Followup labs 08/22/11: Potassium 4.4, creatinine 0.8.  He denies further chest pain.  He does note dyspnea with exertion.  He does have chronic dyspnea from COPD.  He also feels fatigue.  He probably describes NYHA class IIb symptoms.  He denies orthopnea, PND or edema.  He denies palpitations or syncope.  He is interested in cardiac rehabilitation.  Past Medical History  Diagnosis Date  . Pneumonia 12/06/10   healthcare-associated/ left lower lobe  . COPD (chronic obstructive pulmonary disease)     severe stage IV  . Atrial fibrillation     on Coumadin with [employer] single-chamber pacemaker  . Diabetes mellitus     type 2; s/p Prednisone therapy for pneumonia 12/2010  . Coronary artery disease     s/p anterior STEMI 09/2009; cath. revealed mid LAD 40%, distal LAD 100%- PTCA, prox. RCA 40%, mid. RCA 100% with good collateral filling of PDA; EF 25%; NSTEMI 07/2011 - PCI/DES 100% LCX  . ST elevation (STEMI) myocardial infarction 01//11/12    anterior  . CHF (congestive heart failure)     EF 25% on 09/26/10; EF 50-55% and grade 1 diastolic dysfunction on ECHO 08/2010   . Hypertension   . Hyperlipidemia   . AAA (abdominal aortic aneurysm)     3 cm infrarenal abdominal aortic aneurysm per aorta ultrasound 03/2010  . Osteoarthritis     s/p bilat hip arthroplasty  . Angina   . Shortness of breath   . Myocardial infarction     Current Outpatient Prescriptions  Medication Sig Dispense Refill  . bisoprolol (ZEBETA) 5 MG tablet Take 5 mg by mouth daily.        . budesonide (PULMICORT) 0.5 MG/2ML nebulizer solution Take 2 mLs (0.5 mg total) by nebulization 2 (two) times daily as needed.  60 mL  6  . Cholecalciferol (VITAMIN D3) 2000 UNITS TABS Take by mouth.        . clopidogrel (PLAVIX) 75 MG tablet Take 1 tablet (75 mg total)  by mouth daily with breakfast.  30 tablet  6  . digoxin (LANOXIN) 0.25 MG tablet Take 250 mcg by mouth daily.        Marland Kitchen diltiazem (CARDIZEM CD) 360 MG 24 hr capsule Take 360 mg by mouth daily.        . furosemide (LASIX) 40 MG tablet Take 40 mg by mouth daily.        Marland Kitchen ipratropium (ATROVENT) 0.02 % nebulizer solution Take 2.5 mLs (500 mcg total) by nebulization 4 (four) times daily.  300 mL  6  . isosorbide mononitrate (IMDUR) 60 MG 24 hr tablet Take 60 mg by mouth daily.        Marland Kitchen lisinopril (PRINIVIL,ZESTRIL) 2.5 MG tablet Take 1 tablet (2.5 mg total) by mouth daily.  30  tablet  6  . nitroGLYCERIN (NITROSTAT) 0.4 MG SL tablet Place 1 tablet (0.4 mg total) under the tongue every 5 (five) minutes as needed.  25 tablet  3  . oxycodone (OXY-IR) 5 MG capsule Take 5 mg by mouth every 4 (four) hours as needed. For pain      . Potassium Gluconate 550 MG TABS Take 1 tablet by mouth daily.        . rosuvastatin (CRESTOR) 20 MG tablet Take 2 tablets (40 mg total) by mouth daily.  30 tablet  6  . sennosides-docusate sodium (SENOKOT-S) 8.6-50 MG tablet Take 2 tablets by mouth at bedtime.       Marland Kitchen warfarin (COUMADIN) 5 MG tablet Take 5-7.5 mg by mouth daily. Take 7.5mg  on Sunday and take 5mg  on all other days          Allergies: Allergies  Allergen Reactions  . Morphine Itching    History  Substance Use Topics  . Smoking status: Former Smoker -- 1.5 packs/day for 50 years    Types: Cigarettes    Quit date: 09/02/2009  . Smokeless tobacco: Never Used   Comment: smoked for 62yrs quit for 50yrs started back & smoked for 10ys  . Alcohol Use: No     occ - alcohol     ROS:  Please see the history of present illness.   He notes a chronic cough.  There has been no change in sputum production.  He denies melena, hematochezia, hematuria, hematemesis.  All other systems reviewed and negative.   PHYSICAL EXAM: VS:  BP 130/70  Pulse 71  Resp 18  Wt 179 lb (81.194 kg) Well nourished, well developed, in no acute distress HEENT: normal Neck: no JVD Cardiac:  normal S1, S2; Irregular rhythm; no murmur Lungs:  clear to auscultation bilaterally, no wheezing, rhonchi or rales Abd: soft, nontender, no hepatomegaly Ext: no edema; Right femoral arteriotomy site without hematoma or bruit Skin: warm and dry Neuro:  CNs 2-12 intact, no focal abnormalities noted Psych: Normal affect  EKG:   Ventricular paced rhythm, heart rate 76, PVC  ASSESSMENT AND PLAN:

## 2011-08-27 NOTE — Assessment & Plan Note (Signed)
2/2 ICM.  As noted, check a BNP today and adjust Lasix if necessary.

## 2011-08-27 NOTE — Assessment & Plan Note (Signed)
Crestor dose increased during his recent hospital stay.  Check lipids and LFTs in 6 weeks.

## 2011-08-28 ENCOUNTER — Encounter: Payer: Self-pay | Admitting: Internal Medicine

## 2011-08-28 ENCOUNTER — Other Ambulatory Visit: Payer: Self-pay | Admitting: Internal Medicine

## 2011-08-28 LAB — CBC WITH DIFFERENTIAL/PLATELET
Basophils Relative: 0.3 % (ref 0.0–3.0)
Eosinophils Relative: 0.8 % (ref 0.0–5.0)
Hemoglobin: 15.7 g/dL (ref 13.0–17.0)
Lymphocytes Relative: 24.2 % (ref 12.0–46.0)
Monocytes Relative: 10.1 % (ref 3.0–12.0)
Neutro Abs: 4.6 10*3/uL (ref 1.4–7.7)
RBC: 4.92 Mil/uL (ref 4.22–5.81)
WBC: 7.1 10*3/uL (ref 4.5–10.5)

## 2011-08-28 LAB — PACEMAKER DEVICE OBSERVATION
BRDY-0002RV: 60 {beats}/min
BRDY-0004RV: 120 {beats}/min
BRDY-0005RV: 50 {beats}/min
RV LEAD IMPEDENCE PM: 550 Ohm
VENTRICULAR PACING PM: 43

## 2011-08-28 LAB — BASIC METABOLIC PANEL
CO2: 30 mEq/L (ref 19–32)
Calcium: 9.1 mg/dL (ref 8.4–10.5)
GFR: 75.29 mL/min (ref >60.00)
Sodium: 140 mEq/L (ref 135–145)

## 2011-08-28 NOTE — Progress Notes (Signed)
Pacer check in clinic  

## 2011-08-29 ENCOUNTER — Other Ambulatory Visit (INDEPENDENT_AMBULATORY_CARE_PROVIDER_SITE_OTHER): Payer: Medicare Other | Admitting: *Deleted

## 2011-08-29 ENCOUNTER — Other Ambulatory Visit: Payer: Self-pay

## 2011-08-29 ENCOUNTER — Telehealth: Payer: Self-pay | Admitting: Internal Medicine

## 2011-08-29 DIAGNOSIS — R0602 Shortness of breath: Secondary | ICD-10-CM

## 2011-08-29 DIAGNOSIS — I509 Heart failure, unspecified: Secondary | ICD-10-CM

## 2011-08-29 LAB — BRAIN NATRIURETIC PEPTIDE: Pro B Natriuretic peptide (BNP): 502 pg/mL — ABNORMAL HIGH (ref 0.0–100.0)

## 2011-08-29 NOTE — Telephone Encounter (Signed)
New message  ° ° °Pt is returning your call from yesterday  °

## 2011-08-30 ENCOUNTER — Other Ambulatory Visit: Payer: Self-pay

## 2011-08-30 DIAGNOSIS — R0602 Shortness of breath: Secondary | ICD-10-CM

## 2011-08-30 DIAGNOSIS — I509 Heart failure, unspecified: Secondary | ICD-10-CM

## 2011-08-30 MED ORDER — FUROSEMIDE 40 MG PO TABS: 60.0000 mg | ORAL_TABLET | Freq: Every day | ORAL | Status: DC

## 2011-09-02 ENCOUNTER — Ambulatory Visit (INDEPENDENT_AMBULATORY_CARE_PROVIDER_SITE_OTHER): Payer: Medicare Other | Admitting: Emergency Medicine

## 2011-09-02 ENCOUNTER — Encounter: Payer: Self-pay | Admitting: Emergency Medicine

## 2011-09-02 ENCOUNTER — Telehealth: Payer: Self-pay | Admitting: Internal Medicine

## 2011-09-02 DIAGNOSIS — J449 Chronic obstructive pulmonary disease, unspecified: Secondary | ICD-10-CM

## 2011-09-02 MED ORDER — LEVALBUTEROL TARTRATE 45 MCG/ACT IN AERO: 1.0000 | INHALATION_SPRAY | RESPIRATORY_TRACT | Status: DC | PRN

## 2011-09-02 MED ORDER — PREDNISONE 10 MG PO TABS: ORAL_TABLET | ORAL | Status: DC

## 2011-09-02 MED ORDER — LEVALBUTEROL HCL 0.63 MG/3ML IN NEBU
0.6300 mg | INHALATION_SOLUTION | Freq: Once | RESPIRATORY_TRACT | Status: AC
  Administered 2011-09-02: 0.63 mg via RESPIRATORY_TRACT

## 2011-09-02 MED ORDER — AZITHROMYCIN 250 MG PO TABS: 250.0000 mg | ORAL_TABLET | Freq: Every day | ORAL | Status: DC

## 2011-09-02 NOTE — Progress Notes (Signed)
  Subjective:    Patient ID: ZAN ORLICK, male    DOB: Dec 18, 1927, 77 y.o.   MRN: [id number] HPI Problem list 1. 75 pack smoking hx. Quit 2010. Known CAD 2. COPD - Gold stage 3-18 November 2010 spirometry, Did not desaturate May 2012 185 feet x 3 laps. On atrovent, pulmicort nebs 3. AECOPD hx: March 2012 (LLL PNA), April 2012 - clear cxr  OV 04/26/2011: Followup for above. Overall well. COmpliant with pulmicort and atrovent scheduled. Dyspnea since last visit is improved. Doing rehab now and is helping. Rates dyspnea as mild. EXertional for class 2 activiities only now (before class 3). Relieved by rest. Does not want additional nebs to help. Associated cough with mucus present ; this is worst early morning and is associated with sinus drainage post nasally. Sputum is small amount and clear.   Spirometry today is fev1 1.06L/34%, ratio 47 and is unchanged  Acute Visit 09/02/11 -- 77 yo man with CAD and recent PTCI, followed by Dr MR for GOLD3-4 COPD. Presents today with acute worsening in cough, wheeze, SOB, possibly after URI but he relates it to being exposed to some strong cologne.    Objective:  Gen: Pleasant, in wheelchair, coughing frequently and hard,   ENT: No lesions,  mouth clear,  oropharynx clear, no postnasal drip  Neck: No JVD, no TMG, no carotid bruits  Lungs: No use of accessory muscles, soft exp wheeze, more prominent w cough  Cardiovascular: RRR, heart sounds normal, no murmur or gallops, no peripheral edema  Musculoskeletal: No deformities, no cyanosis or clubbing  Neuro: alert, non focal  Skin: Warm, no lesions or rashes  Assessment & Plan:  COPD, severe Difficult exam to to frequent cough, but I can hear wheeze. Overall picture consistent w AE-COPD - rx with pred + azithro - xopenex neb now and HFA to use prn (he stopped albuterol due to arrhythmia)  - OV 2 weeks with MR to assess progress

## 2011-09-02 NOTE — Telephone Encounter (Signed)
[id number] x1 

## 2011-09-02 NOTE — Progress Notes (Signed)
Addended by: Michel Bickers A on: 09/02/2011 03:50 PM   Modules accepted: Orders

## 2011-09-02 NOTE — Telephone Encounter (Signed)
I spoke with pt daughter and she states pt is very SOB, runny nose, cough w/ clear phlem, chest congestion. No nausea, vomiting, fever. Pt is scheduled to come in and see RB today at 2:00 for an evaluation.

## 2011-09-02 NOTE — Assessment & Plan Note (Signed)
Difficult exam to to frequent cough, but I can hear wheeze. Overall picture consistent w AE-COPD - rx with pred + azithro - xopenex neb now and HFA to use prn (he stopped albuterol due to arrhythmia)  - OV 2 weeks with MR to assess progress

## 2011-09-02 NOTE — Patient Instructions (Signed)
Please continue your atrovent and pulmicort nebulizers as you are taking them Start using xopenex 2 puffs up to every 4 hours if needed for shortness of breath Take prednisone and azithromycin as directed.  Follow with Dr Marchelle Gearing in 2 weeks or sooner if you worsen in any way

## 2011-09-02 NOTE — Telephone Encounter (Signed)
Pt's daughter returned call & can be reached at [phone].  [name]

## 2011-09-05 ENCOUNTER — Ambulatory Visit (HOSPITAL_COMMUNITY): Payer: Medicare Other

## 2011-09-05 ENCOUNTER — Ambulatory Visit (INDEPENDENT_AMBULATORY_CARE_PROVIDER_SITE_OTHER): Payer: Medicare Other | Admitting: *Deleted

## 2011-09-05 ENCOUNTER — Other Ambulatory Visit (INDEPENDENT_AMBULATORY_CARE_PROVIDER_SITE_OTHER): Payer: Medicare Other | Admitting: *Deleted

## 2011-09-05 DIAGNOSIS — I509 Heart failure, unspecified: Secondary | ICD-10-CM

## 2011-09-05 DIAGNOSIS — I4891 Unspecified atrial fibrillation: Secondary | ICD-10-CM

## 2011-09-05 DIAGNOSIS — R0602 Shortness of breath: Secondary | ICD-10-CM

## 2011-09-05 LAB — BRAIN NATRIURETIC PEPTIDE: Pro B Natriuretic peptide (BNP): 317 pg/mL — ABNORMAL HIGH (ref 0.0–100.0)

## 2011-09-05 LAB — BASIC METABOLIC PANEL
BUN: 25 mg/dL — ABNORMAL HIGH (ref 6–23)
CO2: 28 mEq/L (ref 19–32)
Chloride: 105 mEq/L (ref 96–112)
Creatinine, Ser: 0.8 mg/dL (ref 0.4–1.5)
Glucose, Bld: 105 mg/dL — ABNORMAL HIGH (ref 70–99)

## 2011-09-05 LAB — POCT INR: INR: 4.8

## 2011-09-13 ENCOUNTER — Ambulatory Visit (INDEPENDENT_AMBULATORY_CARE_PROVIDER_SITE_OTHER): Payer: Medicare Other | Admitting: *Deleted

## 2011-09-13 DIAGNOSIS — I4891 Unspecified atrial fibrillation: Secondary | ICD-10-CM

## 2011-09-18 ENCOUNTER — Ambulatory Visit: Payer: Medicare Other | Admitting: Adult Health

## 2011-09-20 ENCOUNTER — Encounter: Payer: Self-pay | Admitting: Adult Health

## 2011-09-20 ENCOUNTER — Ambulatory Visit (INDEPENDENT_AMBULATORY_CARE_PROVIDER_SITE_OTHER): Payer: Medicare Other | Admitting: Adult Health

## 2011-09-20 DIAGNOSIS — J449 Chronic obstructive pulmonary disease, unspecified: Secondary | ICD-10-CM

## 2011-09-20 NOTE — Assessment & Plan Note (Signed)
Recent flare -now resolved with abx and steroid taper.  Cont on current regimen.

## 2011-09-20 NOTE — Progress Notes (Signed)
  Subjective:    Patient ID: [name], male    DOB: 01-10-1928, 78 y.o.   MRN: [id number] HPI Problem list 1. 78 pack smoking hx. Quit 2010. Known CAD 2. COPD - Gold stage 3-18 November 2010 spirometry, Did not desaturate May 2012 185 feet x 3 laps. On atrovent, pulmicort nebs 3. AECOPD hx: March 2012 (LLL PNA), April 2012 - clear cxr  OV 04/26/2011: Followup for above. Overall well. COmpliant with pulmicort and atrovent scheduled. Dyspnea since last visit is improved. Doing rehab now and is helping. Rates dyspnea as mild. EXertional for class 2 activiities only now (before class 3). Relieved by rest. Does not want additional nebs to help. Associated cough with mucus present ; this is worst early morning and is associated with sinus drainage post nasally. Sputum is small amount and clear.   Spirometry today is fev1 1.06L/34%, ratio 47 and is unchanged  Acute Visit 09/02/11 -- 78 yo man with CAD and recent PTCI, followed by Dr MR for GOLD3-4 COPD. Presents today with acute worsening in cough, wheeze, SOB, possibly after URI but he relates it to being exposed to some strong cologne.  >>pred pack and zpack given    09/20/2011 Follow up  Returns for follow up . Last ov with AECOPD, tx w/ zpack and steroid taper.  He is feeling much better. Back to his baseline. Cough is much better.  No hemoptysis . No chest pain or edema.   Of note he is on ACE Inhibitor .     Objective:  Gen: Pleasant, elderly with cane  ENT: No lesions,  mouth clear,  oropharynx clear, no postnasal drip  Neck: No JVD, no TMG, no carotid bruits  Lungs: No use of accessory muscles, no wheezing   Cardiovascular: RRR, heart sounds normal, no murmur or gallops, no peripheral edema  Musculoskeletal: No deformities, no cyanosis or clubbing  Neuro: alert, non focal  Skin: Warm, no lesions or rashes  Assessment & Plan:

## 2011-09-20 NOTE — Patient Instructions (Signed)
Continue on current regimen Keep up good work.  Follow up Dr. Marchelle Gearing in 6-8 weeks and As needed

## 2011-09-23 ENCOUNTER — Encounter (HOSPITAL_COMMUNITY): Payer: Medicare Other

## 2011-09-23 NOTE — Progress Notes (Signed)
Noted 3 AECOPD for 2012. AT [facility] will check on ACE inhibitor intake. Rest per NP

## 2011-09-25 ENCOUNTER — Encounter (HOSPITAL_COMMUNITY): Payer: Medicare Other

## 2011-09-26 ENCOUNTER — Encounter (HOSPITAL_COMMUNITY)
Admission: RE | Admit: 2011-09-26 | Discharge: 2011-09-26 | Disposition: A | Discharge status: Home / Self Care | Payer: Medicare Other | Source: Ambulatory Visit | Attending: Internal Medicine | Admitting: Internal Medicine

## 2011-09-26 DIAGNOSIS — Z7901 Long term (current) use of anticoagulants: Secondary | ICD-10-CM | POA: Insufficient documentation

## 2011-09-26 DIAGNOSIS — E119 Type 2 diabetes mellitus without complications: Secondary | ICD-10-CM | POA: Insufficient documentation

## 2011-09-26 DIAGNOSIS — I509 Heart failure, unspecified: Secondary | ICD-10-CM | POA: Insufficient documentation

## 2011-09-26 DIAGNOSIS — I10 Essential (primary) hypertension: Secondary | ICD-10-CM | POA: Insufficient documentation

## 2011-09-26 DIAGNOSIS — Z9861 Coronary angioplasty status: Secondary | ICD-10-CM | POA: Insufficient documentation

## 2011-09-26 DIAGNOSIS — I252 Old myocardial infarction: Secondary | ICD-10-CM | POA: Insufficient documentation

## 2011-09-26 DIAGNOSIS — I2510 Atherosclerotic heart disease of native coronary artery without angina pectoris: Secondary | ICD-10-CM | POA: Insufficient documentation

## 2011-09-26 DIAGNOSIS — I714 Abdominal aortic aneurysm, without rupture, unspecified: Secondary | ICD-10-CM | POA: Insufficient documentation

## 2011-09-26 DIAGNOSIS — J449 Chronic obstructive pulmonary disease, unspecified: Secondary | ICD-10-CM | POA: Insufficient documentation

## 2011-09-26 DIAGNOSIS — E785 Hyperlipidemia, unspecified: Secondary | ICD-10-CM | POA: Insufficient documentation

## 2011-09-26 DIAGNOSIS — I4891 Unspecified atrial fibrillation: Secondary | ICD-10-CM | POA: Insufficient documentation

## 2011-09-26 DIAGNOSIS — I5022 Chronic systolic (congestive) heart failure: Secondary | ICD-10-CM | POA: Insufficient documentation

## 2011-09-26 DIAGNOSIS — Z96649 Presence of unspecified artificial hip joint: Secondary | ICD-10-CM | POA: Insufficient documentation

## 2011-09-26 DIAGNOSIS — Z5189 Encounter for other specified aftercare: Secondary | ICD-10-CM | POA: Insufficient documentation

## 2011-09-26 DIAGNOSIS — J4489 Other specified chronic obstructive pulmonary disease: Secondary | ICD-10-CM | POA: Insufficient documentation

## 2011-09-26 DIAGNOSIS — J9610 Chronic respiratory failure, unspecified whether with hypoxia or hypercapnia: Secondary | ICD-10-CM | POA: Insufficient documentation

## 2011-09-27 ENCOUNTER — Ambulatory Visit (INDEPENDENT_AMBULATORY_CARE_PROVIDER_SITE_OTHER): Payer: Medicare Other | Admitting: *Deleted

## 2011-09-27 ENCOUNTER — Encounter (HOSPITAL_COMMUNITY): Payer: Medicare Other

## 2011-09-27 DIAGNOSIS — I4891 Unspecified atrial fibrillation: Secondary | ICD-10-CM

## 2011-09-30 ENCOUNTER — Encounter (HOSPITAL_COMMUNITY): Payer: Medicare Other

## 2011-10-01 ENCOUNTER — Telehealth: Payer: Self-pay | Admitting: Internal Medicine

## 2011-10-01 NOTE — Telephone Encounter (Signed)
Signed and faxed

## 2011-10-01 NOTE — Telephone Encounter (Signed)
New problem:  Order was faxed over to start cardiac rehab. Need a signature today if possible

## 2011-10-02 ENCOUNTER — Encounter (HOSPITAL_COMMUNITY): Payer: Medicare Other

## 2011-10-03 NOTE — Progress Notes (Signed)
[name] 78 y.o. male       Nutrition Screen                                                                    YES  NO Do you live in a nursing home?  X   Do you eat out more than 3 times/week?    X If yes, how many times per week do you eat out?  Do you have food allergies?   X If yes, what are you allergic to?  Have you gained or lost more than 10 lbs without trying?               X If yes, how much weight have you lost and over what time period?  lbs gained or lost over  weeks/month  Do you want to lose weight?     X If yes, what is a goal weight or amount of weight you would like to lose?  Do you eat alone most of the time?  X    Do you eat less than 2 meals/day?  X If yes, how many meals do you eat?  Do you drink more than 3 alcohol drinks/day?  X If yes, how many drinks per day?  Are you having trouble with constipation? *  X If yes, what are you doing to help relieve constipation?  Do you have financial difficulties with buying food?*    X   Are you experiencing regular nausea/ vomiting?*     X   Do you have a poor appetite? *                                        X   Do you have trouble chewing/swallowing? *   X    Pt with diagnoses of:  X Stent/ PTCA X GERD          X COPD X Dyslipidemia  / HDL< 40 / LDL>70 / High TG      X >36 years old  X %  Body fat >goal / Body Mass Index >25 X HTN / BP >120/80 X MI X CHF  X DM/A1c > 6       Pt Risk Score        1       Diagnosis Risk Score   140       Total Risk Score   141                        X High Risk                Low Risk              HT:   69" Ht Readings from Last 1 Encounters:  09/26/11 5\' 9"  (1.753 m)    WT:   177.3 lb (80.6 kg) Wt Readings from Last 3 Encounters:  09/26/11 177 lb 11.1 oz (80.6 kg)  09/20/11 182 lb (82.555 kg)  09/02/11 174 lb 9.6 oz (79.198 kg)     IBW 72.7 111%IBW BMI 26.2 28.7%body fat   Meds  reviewed: vitamin D3, Lasix, Coumadin, Potassium Chloride   Past Medical History    Diagnosis Date  . Pneumonia 12/06/10    healthcare-associated/ left lower lobe  . COPD (chronic obstructive pulmonary disease)     severe stage IV  . Atrial fibrillation     on Coumadin with St. Jude single-chamber pacemaker  . Diabetes mellitus     type 2; s/p Prednisone therapy for pneumonia 12/2010  . Coronary artery disease     s/p anterior STEMI 09/2009; cath. revealed mid LAD 40%, distal LAD 100%- PTCA, prox. RCA 40%, mid. RCA 100% with good collateral filling of PDA; EF 25%; NSTEMI 07/2011 - PCI/DES 100% LCX  . ST elevation (STEMI) myocardial infarction 01//11/12    anterior  . CHF (congestive heart failure)     EF 25% on 09/26/10; EF 50-55% and grade 1 diastolic dysfunction on ECHO 08/2010   . Hypertension   . Hyperlipidemia   . AAA (abdominal aortic aneurysm)     3 cm infrarenal abdominal aortic aneurysm per aorta ultrasound 03/2010  . Osteoarthritis     s/p bilat hip arthroplasty  . Angina   . Ischemic cardiomyopathy     EF 35% LHC 11/12        Activity level: Pt is sedentary  Wt goal: 177 lb ( 80.6 kg) Current tobacco use? No     Pt quit tobacco use on 09/02/09 Food/Drug Interaction? Yes If Y, which med(s)? Coumadin If yes, pt given Food/Drug Interaction handout? Yes Labs:  Lipid Panel     Component Value Date/Time   CHOL 128 08/15/2011 0422   TRIG 146 08/15/2011 0422   HDL 32* 08/15/2011 0422   CHOLHDL 4.0 08/15/2011 0422   VLDL 29 08/15/2011 0422   LDLCALC 67 08/15/2011 0422   Lab Results  Component Value Date   HGBA1C 7.0* 08/13/2011   08/15/11 Glucose 101   LDL goal:< 70      MI and > 2:      HTN, HDL, > 76 yo male  Estimated Daily Nutrition Needs for: ? wt maintenance  2100-2400 Kcal , Total Fat 70-80gm, Saturated Fat 16-18 gm, Trans Fat 2.3-2.7 gm,  Sodium less than 1500 mg, Grams of CHO 250-325

## 2011-10-04 ENCOUNTER — Encounter (HOSPITAL_COMMUNITY)
Admission: RE | Admit: 2011-10-04 | Discharge: 2011-10-04 | Disposition: A | Discharge status: Home / Self Care | Payer: Medicare Other | Source: Ambulatory Visit | Attending: Internal Medicine | Admitting: Internal Medicine

## 2011-10-04 NOTE — Progress Notes (Signed)
Pt started cardiac rehab today.  Pt tolerated light exercise without difficulty.  Pt denies chest pain or dyspnea with exercise, however did c/o lower back pain with bicycle.  VSS. Telemetry-v paced, atrial fib underlying, frequent PVC with couplets and triplets.  First day strips faxed to Dr. Ladona Ridgel for review.  Pt educated to exercise equipment and routine understanding verbalized.  Will continue to monitor the patient throughout  the program.-jr,rn

## 2011-10-07 ENCOUNTER — Encounter (HOSPITAL_COMMUNITY)
Admission: RE | Admit: 2011-10-07 | Discharge: 2011-10-07 | Disposition: A | Discharge status: Home / Self Care | Payer: Medicare Other | Source: Ambulatory Visit | Attending: Internal Medicine | Admitting: Internal Medicine

## 2011-10-08 ENCOUNTER — Encounter: Payer: Self-pay | Admitting: Internal Medicine

## 2011-10-09 ENCOUNTER — Encounter (HOSPITAL_COMMUNITY)
Admission: RE | Admit: 2011-10-09 | Discharge: 2011-10-09 | Disposition: A | Discharge status: Home / Self Care | Payer: Medicare Other | Source: Ambulatory Visit | Attending: Internal Medicine | Admitting: Internal Medicine

## 2011-10-11 ENCOUNTER — Ambulatory Visit (INDEPENDENT_AMBULATORY_CARE_PROVIDER_SITE_OTHER): Payer: Medicare Other | Admitting: *Deleted

## 2011-10-11 ENCOUNTER — Encounter (HOSPITAL_COMMUNITY): Payer: Medicare Other

## 2011-10-11 ENCOUNTER — Encounter: Payer: Medicare Other | Admitting: Internal Medicine

## 2011-10-11 ENCOUNTER — Encounter: Payer: Self-pay | Admitting: Internal Medicine

## 2011-10-11 ENCOUNTER — Ambulatory Visit (INDEPENDENT_AMBULATORY_CARE_PROVIDER_SITE_OTHER): Payer: Medicare Other | Admitting: Internal Medicine

## 2011-10-11 DIAGNOSIS — Z950 Presence of cardiac pacemaker: Secondary | ICD-10-CM

## 2011-10-11 DIAGNOSIS — E785 Hyperlipidemia, unspecified: Secondary | ICD-10-CM

## 2011-10-11 DIAGNOSIS — I2510 Atherosclerotic heart disease of native coronary artery without angina pectoris: Secondary | ICD-10-CM

## 2011-10-11 DIAGNOSIS — I4891 Unspecified atrial fibrillation: Secondary | ICD-10-CM

## 2011-10-11 LAB — LIPID PANEL: Total CHOL/HDL Ratio: 3

## 2011-10-11 LAB — HEPATIC FUNCTION PANEL
ALT: 18 U/L (ref 0–53)
AST: 20 U/L (ref 0–37)
Alkaline Phosphatase: 66 U/L (ref 39–117)
Bilirubin, Direct: 0.1 mg/dL (ref 0.0–0.3)
Total Protein: 6.3 g/dL (ref 6.0–8.3)

## 2011-10-11 NOTE — Progress Notes (Signed)
HPI [name] returns today for followup. He is a pleasant 78 year old man with a history of coronary artery disease, atrial fibrillation, COPD, and dyslipidemia. The patient underwent stenting of a left circumflex coronary artery several months ago. He is taking part in cardiac rehabilitation. He denies chest pain or shortness of breath. No peripheral edema. His biggest complaint today is that of back pain. His orthopedic surgeon has requested that he undergo spinal injections. Unfortunately, he would have to come off of his warfarin and Plavix to do this. Allergies  Allergen Reactions  . Morphine Itching     Current Outpatient Prescriptions  Medication Sig Dispense Refill  . bisoprolol (ZEBETA) 5 MG tablet Take 5 mg by mouth daily.        . budesonide (PULMICORT) 0.5 MG/2ML nebulizer solution Take 2 mLs (0.5 mg total) by nebulization 2 (two) times daily as needed.  60 mL  6  . Cholecalciferol (VITAMIN D3) 2000 UNITS TABS Take by mouth.        . clopidogrel (PLAVIX) 75 MG tablet Take 1 tablet (75 mg total) by mouth daily with breakfast.  30 tablet  6  . digoxin (LANOXIN) 0.25 MG tablet Take 250 mcg by mouth daily.        Marland Kitchen diltiazem (DILACOR XR) 240 MG 24 hr capsule Take 1 capsule (240 mg total) by mouth daily.      . furosemide (LASIX) 40 MG tablet Take 40 mg by mouth 2 (two) times daily.        Marland Kitchen ipratropium (ATROVENT) 0.02 % nebulizer solution Take 2.5 mLs (500 mcg total) by nebulization 4 (four) times daily.  300 mL  6  . isosorbide mononitrate (IMDUR) 60 MG 24 hr tablet Take 60 mg by mouth daily.        Marland Kitchen levalbuterol (XOPENEX HFA) 45 MCG/ACT inhaler Inhale 1-2 puffs into the lungs every 4 (four) hours as needed for wheezing.  1 Inhaler  12  . lisinopril (PRINIVIL,ZESTRIL) 2.5 MG tablet Take 1 tablet (2.5 mg total) by mouth daily.  30 tablet  6  . nitroGLYCERIN (NITROSTAT) 0.4 MG SL tablet Place 1 tablet (0.4 mg total) under the tongue every 5 (five) minutes as needed.  25 tablet  3    . oxycodone (OXY-IR) 5 MG capsule Take 5 mg by mouth every 4 (four) hours as needed. For pain      . Potassium Gluconate 550 MG TABS Take 1 tablet by mouth daily.        . rosuvastatin (CRESTOR) 20 MG tablet Take 2 tablets (40 mg total) by mouth daily.  30 tablet  6  . sennosides-docusate sodium (SENOKOT-S) 8.6-50 MG tablet Take 2 tablets by mouth at bedtime.       Marland Kitchen warfarin (COUMADIN) 5 MG tablet Take 5 mg by mouth daily.          Past Medical History  Diagnosis Date  . Pneumonia 12/06/10    healthcare-associated/ left lower lobe  . COPD (chronic obstructive pulmonary disease)     severe stage IV  . Atrial fibrillation     on Coumadin with St. Jude single-chamber pacemaker  . Diabetes mellitus     type 2; s/p Prednisone therapy for pneumonia 12/2010  . Coronary artery disease     s/p anterior STEMI 09/2009; cath. revealed mid LAD 40%, distal LAD 100%- PTCA, prox. RCA 40%, mid. RCA 100% with good collateral filling of PDA; EF 25%; NSTEMI 07/2011 - PCI/DES 100% LCX  . ST elevation (STEMI)  myocardial infarction 01//11/12    anterior  . CHF (congestive heart failure)     EF 25% on 09/26/10; EF 50-55% and grade 1 diastolic dysfunction on ECHO 08/2010   . Hypertension   . Hyperlipidemia   . AAA (abdominal aortic aneurysm)     3 cm infrarenal abdominal aortic aneurysm per aorta ultrasound 03/2010  . Osteoarthritis     s/p bilat hip arthroplasty  . Angina   . Ischemic cardiomyopathy     EF 35% LHC 11/12  . GERD (gastroesophageal reflux disease)   . Hypercholesteremia   . PVD (peripheral vascular disease)   . Anxiety     ROS:   All systems reviewed and negative except as noted in the HPI.   Past Surgical History  Procedure Date  . Hip arthroplasty     s/p right 09/27/10 and s/p left 09/24/10  . Back surgery   . Knee surgery   . Pacemaker insertion 12/2010    St. Jude single-chamber   . Cardiac catheterization 09/2009, 06/2007    PTCA to distal LAD     Family History   Problem Relation Age of Onset  . Heart attack Mother 49  . Heart attack Father 41  . Cancer Other     siblings  . Heart attack Other      History   Social History  . Marital Status: Widowed    Spouse Name: N/A    Number of Children: N/A  . Years of Education: N/A   Occupational History  . Not on file.   Social History Main Topics  . Smoking status: Former Smoker -- 1.5 packs/day for 50 years    Types: Cigarettes    Quit date: 09/02/2009  . Smokeless tobacco: Never Used   Comment: smoked for 47yrs quit for 58yrs started back & smoked for 10ys  . Alcohol Use: No     occ - alcohol  . Drug Use: No  . Sexually Active: Not Currently   Other Topics Concern  . Not on file   Social History Narrative  . No narrative on file     BP 114/60  Pulse 85  Ht 5\' 9"  (1.753 m)  Wt 81.248 kg (179 lb 1.9 oz)  BMI 26.45 kg/m2  Physical Exam:  Well appearing elderly man, NAD HEENT: Unremarkable Neck:  No JVD, no thyromegally Lungs:  Clear with no wheezes. HEART:  Regular rate rhythm, no murmurs, no rubs, no clicks Abd:  soft, positive bowel sounds, no organomegally, no rebound, no guarding Ext:  2 plus pulses, no edema, no cyanosis, no clubbing Skin:  No rashes no nodules Neuro:  CN II through XII intact, motor grossly intact  DEVICE  Normal device function.  See PaceArt for details.   Assess/Plan:

## 2011-10-11 NOTE — Assessment & Plan Note (Signed)
The patient denies anginal symptoms. He is still on Plavix and will need to remain on Plavix until May or June 2013. At this time he could go ahead and have his spinal injections.

## 2011-10-11 NOTE — Assessment & Plan Note (Signed)
His device is working normally. We'll plan to recheck in several months. 

## 2011-10-11 NOTE — Assessment & Plan Note (Signed)
His ventricular rate appears to be well-controlled. He will continue his current medical therapy. 

## 2011-10-11 NOTE — Patient Instructions (Signed)
Your physician wants you to follow-up in: 12 months with  Dr Stevan Born will receive a reminder letter in the mail two months in advance. If you don't receive a letter, please call our office to schedule the follow-up appointment.   Remote monitoring is used to monitor your Pacemaker of ICD from home. This monitoring reduces the number of office visits required to check your device to one time per year. It allows [place] to keep an eye on the functioning of your device to ensure it is working properly. You are scheduled for a device check from home on 01/09/12. You may send your transmission at any time that day. If you have a wireless device, the transmission will be sent automatically. After your physician reviews your transmission, you will receive a postcard with your next transmission date  In 4 months okay to go forward with injections with Dr Shelle Iron

## 2011-10-14 ENCOUNTER — Encounter (HOSPITAL_COMMUNITY)
Admission: RE | Admit: 2011-10-14 | Discharge: 2011-10-14 | Disposition: A | Discharge status: Home / Self Care | Payer: Medicare Other | Source: Ambulatory Visit | Attending: Internal Medicine | Admitting: Internal Medicine

## 2011-10-14 ENCOUNTER — Encounter: Payer: Self-pay | Admitting: *Deleted

## 2011-10-16 ENCOUNTER — Encounter (HOSPITAL_COMMUNITY)
Admission: RE | Admit: 2011-10-16 | Discharge: 2011-10-16 | Disposition: A | Discharge status: Home / Self Care | Payer: Medicare Other | Source: Ambulatory Visit | Attending: Internal Medicine | Admitting: Internal Medicine

## 2011-10-18 ENCOUNTER — Ambulatory Visit (INDEPENDENT_AMBULATORY_CARE_PROVIDER_SITE_OTHER): Payer: Medicare Other | Admitting: *Deleted

## 2011-10-18 ENCOUNTER — Encounter (HOSPITAL_COMMUNITY)
Admission: RE | Admit: 2011-10-18 | Discharge: 2011-10-18 | Disposition: A | Discharge status: Home / Self Care | Payer: Medicare Other | Source: Ambulatory Visit | Attending: Internal Medicine | Admitting: Internal Medicine

## 2011-10-18 DIAGNOSIS — E785 Hyperlipidemia, unspecified: Secondary | ICD-10-CM | POA: Insufficient documentation

## 2011-10-18 DIAGNOSIS — I714 Abdominal aortic aneurysm, without rupture, unspecified: Secondary | ICD-10-CM | POA: Insufficient documentation

## 2011-10-18 DIAGNOSIS — J449 Chronic obstructive pulmonary disease, unspecified: Secondary | ICD-10-CM | POA: Insufficient documentation

## 2011-10-18 DIAGNOSIS — I509 Heart failure, unspecified: Secondary | ICD-10-CM | POA: Insufficient documentation

## 2011-10-18 DIAGNOSIS — I5022 Chronic systolic (congestive) heart failure: Secondary | ICD-10-CM | POA: Insufficient documentation

## 2011-10-18 DIAGNOSIS — J4489 Other specified chronic obstructive pulmonary disease: Secondary | ICD-10-CM | POA: Insufficient documentation

## 2011-10-18 DIAGNOSIS — E119 Type 2 diabetes mellitus without complications: Secondary | ICD-10-CM | POA: Insufficient documentation

## 2011-10-18 DIAGNOSIS — I4891 Unspecified atrial fibrillation: Secondary | ICD-10-CM

## 2011-10-18 DIAGNOSIS — J9610 Chronic respiratory failure, unspecified whether with hypoxia or hypercapnia: Secondary | ICD-10-CM | POA: Insufficient documentation

## 2011-10-18 DIAGNOSIS — Z5189 Encounter for other specified aftercare: Secondary | ICD-10-CM | POA: Insufficient documentation

## 2011-10-18 DIAGNOSIS — I252 Old myocardial infarction: Secondary | ICD-10-CM | POA: Insufficient documentation

## 2011-10-18 DIAGNOSIS — Z9861 Coronary angioplasty status: Secondary | ICD-10-CM | POA: Insufficient documentation

## 2011-10-18 DIAGNOSIS — I2510 Atherosclerotic heart disease of native coronary artery without angina pectoris: Secondary | ICD-10-CM | POA: Insufficient documentation

## 2011-10-18 DIAGNOSIS — Z7901 Long term (current) use of anticoagulants: Secondary | ICD-10-CM | POA: Insufficient documentation

## 2011-10-18 DIAGNOSIS — Z96649 Presence of unspecified artificial hip joint: Secondary | ICD-10-CM | POA: Insufficient documentation

## 2011-10-18 DIAGNOSIS — I10 Essential (primary) hypertension: Secondary | ICD-10-CM | POA: Insufficient documentation

## 2011-10-21 ENCOUNTER — Encounter (HOSPITAL_COMMUNITY)
Admission: RE | Admit: 2011-10-21 | Discharge: 2011-10-21 | Disposition: A | Discharge status: Home / Self Care | Payer: Medicare Other | Source: Ambulatory Visit | Attending: Internal Medicine | Admitting: Internal Medicine

## 2011-10-23 ENCOUNTER — Encounter (HOSPITAL_COMMUNITY)
Admission: RE | Admit: 2011-10-23 | Discharge: 2011-10-23 | Disposition: A | Discharge status: Home / Self Care | Payer: Medicare Other | Source: Ambulatory Visit | Attending: Internal Medicine | Admitting: Internal Medicine

## 2011-10-25 ENCOUNTER — Encounter (HOSPITAL_COMMUNITY)
Admission: RE | Admit: 2011-10-25 | Discharge: 2011-10-25 | Disposition: A | Discharge status: Home / Self Care | Payer: Medicare Other | Source: Ambulatory Visit | Attending: Internal Medicine | Admitting: Internal Medicine

## 2011-10-28 ENCOUNTER — Encounter (HOSPITAL_COMMUNITY)
Admission: RE | Admit: 2011-10-28 | Discharge: 2011-10-28 | Disposition: A | Discharge status: Home / Self Care | Payer: Medicare Other | Source: Ambulatory Visit | Attending: Internal Medicine | Admitting: Internal Medicine

## 2011-10-28 NOTE — Progress Notes (Signed)
[name] 78 y.o. male Nutrition Note Spoke with pt.  Nutrition Plan and Nutrition Survey reviewed with pt. Pt is following Step 1 of the Therapeutic Lifestyle Changes diet. Pt is the primary cook.  Pt choosing low sodium seasonings and is using frozen vegetables.  Pt continues using regular canned vegetables and "sometimes rinse them." Pt states he is not willing to change "much" at this point in his life.  Healthy eating for geriatric patients discussed. Per discussion with pt, pt c/o decrease taste sensation.  Pt reports sweet and sour foods taste the best.  Pt offered to try Miracle fruit before meals to see if that would help with increased taste sensation.  Pt declined to try Miracle fruit at this time. Pt is a diet-controlled diabetic.  Last A1c indicates blood glucose well-controlled for pt's age.  Pt reports he has had his A1c recently drawn and "it was 6-something." Pt does not check his CBG's regularly. Pt has a glucometer and test strips at home "but I don't know where they are."  Pt updated lipid panel reviewed. Pt is on coumadin.  Pt aware of the need to consume a consistent vitamin K diet.  Pt able to recall food high in vitamin K.  Lipid Panel     Component Value Date/Time   CHOL 105 10/11/2011 1102   TRIG 149.0 10/11/2011 1102   HDL 36.00* 10/11/2011 1102   CHOLHDL 3 10/11/2011 1102   VLDL 29.8 10/11/2011 1102   LDLCALC 39 10/11/2011 1102   Nutrition Diagnosis   Food-and nutrition-related knowledge deficit related to lack of exposure to information as related to diagnosis of: ? CVD ? DM (A1c 7.0)  Nutrition RX/ Estimated Daily Nutrition Needs for: wt maintenance 2100-2400 Kcal , Total Fat 70-80gm, Saturated Fat 16-18 gm, Trans Fat 2.3-2.7 gm,  Sodium less than 1500 mg, Grams of CHO 250-325   Nutrition Intervention   Pt's individual nutrition plan including cholesterol goals reviewed with pt.   Benefits of adopting Therapeutic Lifestyle Changes discussed when Medficts  reviewed.   Pt to attend the Portion Distortion class   Pt to attend the  ? Nutrition I class                          ? Nutrition II class        ? Diabetes Blitz class       ? Diabetes Q & A class   Pt given handouts for: ? DM ? Consistent vit K diet ? low sodium    Continue client-centered nutrition education by RD, as part of interdisciplinary care. Goal(s)   Pt to describe the benefit of including fruits, vegetables, whole grains, and low-fat dairy products in a heart healthy, diabetic meal plan. Monitor and Evaluate progress toward nutrition goal with team.

## 2011-10-30 ENCOUNTER — Encounter (HOSPITAL_COMMUNITY): Payer: Medicare Other

## 2011-10-31 ENCOUNTER — Telehealth: Payer: Self-pay | Admitting: Internal Medicine

## 2011-10-31 MED ORDER — PREDNISONE 10 MG PO TABS: ORAL_TABLET | ORAL | Status: DC

## 2011-10-31 MED ORDER — DOXYCYCLINE HYCLATE 100 MG PO TABS: ORAL_TABLET | ORAL | Status: DC

## 2011-10-31 NOTE — Telephone Encounter (Signed)
I spoke with daughter and she states pt c/o labored breathing, feeling weak, chest congestion, runny nose occasionally, cough w/ very little clear phlem x 1 day. Denies any fever, chills, sweats, nausea, vomiting, body aches, sore throat. Daughter stated she did not want to wait for pt to be seen tomorrow. She states prednisone has helped pt in the past. Please advise Dr. Marchelle Gearing, thanks  Allergies  Allergen Reactions  . Morphine Itching

## 2011-10-31 NOTE — Telephone Encounter (Signed)
I spoke with daughter and is aware of MR recs. She voiced her understanding of the rx's being called in and to seek emergency care if pt worsen. Nothing further was needed

## 2011-10-31 NOTE — Telephone Encounter (Signed)
You have attack of copd called COPD exacerbation Please take doxycycline 100mg  twice daily after meals x 5 days; avoid sunlight Please take prednisone 40mg  once daily x 3 days, then 20mg  once daily x 3 days, then 10mg  once daily x 3 days, then 5mg  once dailyx 3 days and stop  If not bettter or worse, go to er or urgent care or come to office acutely or call our service 24/7

## 2011-11-01 ENCOUNTER — Encounter (HOSPITAL_COMMUNITY): Payer: Medicare Other

## 2011-11-04 ENCOUNTER — Encounter: Payer: Self-pay | Admitting: Pulmonary Disease

## 2011-11-04 ENCOUNTER — Telehealth: Payer: Self-pay | Admitting: Internal Medicine

## 2011-11-04 ENCOUNTER — Encounter (HOSPITAL_COMMUNITY): Payer: Medicare Other

## 2011-11-04 ENCOUNTER — Ambulatory Visit (INDEPENDENT_AMBULATORY_CARE_PROVIDER_SITE_OTHER)
Admission: RE | Admit: 2011-11-04 | Discharge: 2011-11-04 | Disposition: A | Discharge status: Home / Self Care | Payer: Medicare Other | Source: Ambulatory Visit | Attending: Pulmonary Disease | Admitting: Pulmonary Disease

## 2011-11-04 ENCOUNTER — Ambulatory Visit (INDEPENDENT_AMBULATORY_CARE_PROVIDER_SITE_OTHER): Payer: Medicare Other | Admitting: Pulmonary Disease

## 2011-11-04 ENCOUNTER — Other Ambulatory Visit (INDEPENDENT_AMBULATORY_CARE_PROVIDER_SITE_OTHER): Payer: Medicare Other

## 2011-11-04 VITALS — BP 102/60 | HR 48 | Temp 97.5°F | Ht 69.0 in | Wt 166.4 lb

## 2011-11-04 DIAGNOSIS — R0600 Dyspnea, unspecified: Secondary | ICD-10-CM

## 2011-11-04 DIAGNOSIS — R0989 Other specified symptoms and signs involving the circulatory and respiratory systems: Secondary | ICD-10-CM

## 2011-11-04 DIAGNOSIS — R0609 Other forms of dyspnea: Secondary | ICD-10-CM

## 2011-11-04 DIAGNOSIS — J449 Chronic obstructive pulmonary disease, unspecified: Secondary | ICD-10-CM

## 2011-11-04 LAB — CBC WITH DIFFERENTIAL/PLATELET
Eosinophils Absolute: 0 10*3/uL (ref 0.0–0.7)
Lymphocytes Relative: 10.7 % — ABNORMAL LOW (ref 12.0–46.0)
MCHC: 33.7 g/dL (ref 30.0–36.0)
MCV: 92.4 fl (ref 78.0–100.0)
Monocytes Absolute: 0.8 10*3/uL (ref 0.1–1.0)
Neutrophils Relative %: 80.2 % — ABNORMAL HIGH (ref 43.0–77.0)
Platelets: 176 10*3/uL (ref 150.0–400.0)
RBC: 5.32 Mil/uL (ref 4.22–5.81)
WBC: 9.1 10*3/uL (ref 4.5–10.5)

## 2011-11-04 MED ORDER — PREDNISONE 10 MG PO TABS: ORAL_TABLET | ORAL | Status: DC

## 2011-11-04 NOTE — Progress Notes (Signed)
  Subjective:    Patient ID: [name], male    DOB: 10/20/1927, 78 y.o.   MRN: [id number]  HPI The patient comes in today for an acute sick visit.  He has known advanced emphysema, and gives a one-week history of worsening shortness of breath above his usual baseline.  He called the office last week, and doxycycline and a prednisone taper per called in for him.  He saw a little bit of improvement with this, but not a lot.  Currently the patient states that he will get winded just walking short distances through his house, but denies chest congestion or purulent mucus.  He does not feel that he has a chest cold.  He has known underlying coronary disease, but has not had anginal chest pain.  He also denies pleuritic chest pain, but has been on chronic anti-coagulation.  He has not had any fevers, chills, or sweats.  He has been attending cardiac rehabilitation, and his oxygen saturations have been adequate even with exertional activities.  He has not had a recent chest x-ray or CBC.   Review of Systems  Constitutional: Positive for appetite change and unexpected weight change. Negative for fever.  HENT: Negative for ear pain, nosebleeds, congestion, sore throat, rhinorrhea, sneezing, trouble swallowing, dental problem, postnasal drip and sinus pressure.   Eyes: Negative for redness and itching.  Respiratory: Positive for cough and shortness of breath. Negative for chest tightness and wheezing.   Cardiovascular: Negative for palpitations and leg swelling.  Gastrointestinal: Negative for nausea and vomiting.  Genitourinary: Negative for dysuria.  Musculoskeletal: Negative for joint swelling.  Skin: Negative for rash.  Neurological: Positive for weakness. Negative for headaches.  Hematological: Bruises/bleeds easily.  Psychiatric/Behavioral: Negative for dysphoric mood. The patient is not nervous/anxious.        Objective:   Physical Exam Wd male in nad Nose without discharge or  purulence Op clear Chest with mild decrease in bs, but adequate airflow.  No active wheezing. Cor with slight irregularity LE without edema, no cyanosis  Alert and oriented, moves all 4.        Assessment & Plan:

## 2011-11-04 NOTE — Assessment & Plan Note (Signed)
The patient is having worsening dyspnea on exertion that is above his usual baseline.  He is not having shortness of breath at rest, and his oxygen saturations at rest today are excellent.  He has not required supplemental oxygen during cardiac rehabilitation.  It is unclear whether this is just a slowly resolving COPD exacerbation, or whether he may have a new cardiopulmonary issues.  We'll put him back on another short course of prednisone, and arrange followup with his primary pulmonologist next week.  We'll check a chest x-ray today for completeness, and also a CBC since he is on anticoagulants.  The patient has been instructed to call us if he worsens or go to the emergency room.

## 2011-11-04 NOTE — Patient Instructions (Signed)
Will check blood count today since you have been on blood thinners. Will put you back on prednisone taper to see if this will improve things.   Will check cxr today to make sure nothing new going on.  Want you to followup with Dr. Marchelle Gearing next week to check on your progress.

## 2011-11-04 NOTE — Telephone Encounter (Signed)
Spoke with Roseanne Reno and he states wants to make sure that [facility] aware that the pt was already prescribed last wk. I see in the notes from OV today, where the pt had made [place] aware of this already. Advised pharmacist of this. He states nothing further needed.

## 2011-11-05 ENCOUNTER — Telehealth (HOSPITAL_COMMUNITY): Payer: Self-pay | Admitting: Cardiac Rehabilitation

## 2011-11-05 ENCOUNTER — Ambulatory Visit: Payer: Medicare Other | Admitting: Internal Medicine

## 2011-11-06 ENCOUNTER — Telehealth: Payer: Self-pay | Admitting: Internal Medicine

## 2011-11-06 ENCOUNTER — Encounter (HOSPITAL_COMMUNITY)
Admission: RE | Admit: 2011-11-06 | Discharge: 2011-11-06 | Disposition: A | Discharge status: Home / Self Care | Payer: Medicare Other | Source: Ambulatory Visit | Attending: Internal Medicine | Admitting: Internal Medicine

## 2011-11-06 NOTE — Telephone Encounter (Signed)
New problem:  Per [name] - daughter calling C/O weak, not getting any better. Unable to walk 10 feet.

## 2011-11-06 NOTE — Telephone Encounter (Signed)
Called, spoke with pt who requests I call his daughter, [name] at [phone].  I called this # and spoke with her husband, Dr. Lawerance Bach who states [name] is unavailable right now.  He would like to schedule OV with Dr. Maple Hudson in the morning at 11 am and will discuss this with [name].  Advised I will sign off on this note and for them to call back if anything further is needed. He verbalized understanding of this.

## 2011-11-06 NOTE — Telephone Encounter (Signed)
Spoke with patient's daughter.  He was seen by Pulmonary on Monday and is not getting any better.  I let her know I would forward this note over to them for review.  I told her she should follow up with Pulmonary since they have been treating him, she says her dad is ready to "check himself into hospital until they can figure out what is wrong."

## 2011-11-07 ENCOUNTER — Other Ambulatory Visit (HOSPITAL_COMMUNITY): Payer: Self-pay | Admitting: Pharmacy Technician

## 2011-11-07 ENCOUNTER — Ambulatory Visit: Payer: Medicare Other | Admitting: Internal Medicine

## 2011-11-07 ENCOUNTER — Ambulatory Visit (INDEPENDENT_AMBULATORY_CARE_PROVIDER_SITE_OTHER): Payer: Medicare Other | Admitting: Internal Medicine

## 2011-11-07 ENCOUNTER — Inpatient Hospital Stay (HOSPITAL_COMMUNITY)
Admission: AD | Admit: 2011-11-07 | Discharge: 2011-11-15 | DRG: 190 | Disposition: A | Discharge status: Hospice | Payer: Medicare Other | Source: Ambulatory Visit | Attending: Internal Medicine | Admitting: Internal Medicine

## 2011-11-07 ENCOUNTER — Inpatient Hospital Stay (HOSPITAL_COMMUNITY): Payer: Medicare Other

## 2011-11-07 ENCOUNTER — Encounter: Payer: Self-pay | Admitting: Internal Medicine

## 2011-11-07 VITALS — BP 98/64 | HR 49 | Ht 69.0 in | Wt 162.0 lb

## 2011-11-07 DIAGNOSIS — I714 Abdominal aortic aneurysm, without rupture, unspecified: Secondary | ICD-10-CM | POA: Diagnosis present

## 2011-11-07 DIAGNOSIS — I214 Non-ST elevation (NSTEMI) myocardial infarction: Secondary | ICD-10-CM

## 2011-11-07 DIAGNOSIS — J9620 Acute and chronic respiratory failure, unspecified whether with hypoxia or hypercapnia: Secondary | ICD-10-CM

## 2011-11-07 DIAGNOSIS — I2510 Atherosclerotic heart disease of native coronary artery without angina pectoris: Secondary | ICD-10-CM

## 2011-11-07 DIAGNOSIS — R627 Adult failure to thrive: Secondary | ICD-10-CM

## 2011-11-07 DIAGNOSIS — R0609 Other forms of dyspnea: Secondary | ICD-10-CM

## 2011-11-07 DIAGNOSIS — F3289 Other specified depressive episodes: Secondary | ICD-10-CM | POA: Diagnosis present

## 2011-11-07 DIAGNOSIS — I200 Unstable angina: Secondary | ICD-10-CM

## 2011-11-07 DIAGNOSIS — R0600 Dyspnea, unspecified: Secondary | ICD-10-CM

## 2011-11-07 DIAGNOSIS — E876 Hypokalemia: Secondary | ICD-10-CM | POA: Diagnosis present

## 2011-11-07 DIAGNOSIS — E785 Hyperlipidemia, unspecified: Secondary | ICD-10-CM

## 2011-11-07 DIAGNOSIS — J441 Chronic obstructive pulmonary disease with (acute) exacerbation: Principal | ICD-10-CM

## 2011-11-07 DIAGNOSIS — I509 Heart failure, unspecified: Secondary | ICD-10-CM | POA: Diagnosis present

## 2011-11-07 DIAGNOSIS — Z515 Encounter for palliative care: Secondary | ICD-10-CM

## 2011-11-07 DIAGNOSIS — F329 Major depressive disorder, single episode, unspecified: Secondary | ICD-10-CM | POA: Diagnosis present

## 2011-11-07 DIAGNOSIS — Z950 Presence of cardiac pacemaker: Secondary | ICD-10-CM | POA: Diagnosis present

## 2011-11-07 DIAGNOSIS — J439 Emphysema, unspecified: Secondary | ICD-10-CM | POA: Diagnosis present

## 2011-11-07 DIAGNOSIS — I5043 Acute on chronic combined systolic (congestive) and diastolic (congestive) heart failure: Secondary | ICD-10-CM

## 2011-11-07 DIAGNOSIS — R0982 Postnasal drip: Secondary | ICD-10-CM

## 2011-11-07 DIAGNOSIS — I4891 Unspecified atrial fibrillation: Secondary | ICD-10-CM

## 2011-11-07 DIAGNOSIS — R339 Retention of urine, unspecified: Secondary | ICD-10-CM | POA: Diagnosis not present

## 2011-11-07 DIAGNOSIS — E119 Type 2 diabetes mellitus without complications: Secondary | ICD-10-CM | POA: Diagnosis present

## 2011-11-07 DIAGNOSIS — Z658 Other specified problems related to psychosocial circumstances: Secondary | ICD-10-CM

## 2011-11-07 LAB — COMPREHENSIVE METABOLIC PANEL
ALT: 26 U/L (ref 0–53)
AST: 20 U/L (ref 0–37)
Albumin: 3.9 g/dL (ref 3.5–5.2)
Alkaline Phosphatase: 65 U/L (ref 39–117)
BUN: 36 mg/dL — ABNORMAL HIGH (ref 6–23)
Chloride: 94 mEq/L — ABNORMAL LOW (ref 96–112)
Potassium: 4 mEq/L (ref 3.5–5.1)
Sodium: 135 mEq/L (ref 135–145)
Total Bilirubin: 0.7 mg/dL (ref 0.3–1.2)
Total Protein: 6.4 g/dL (ref 6.0–8.3)

## 2011-11-07 LAB — BLOOD GAS, ARTERIAL
Acid-Base Excess: 3.1 mmol/L — ABNORMAL HIGH (ref 0.0–2.0)
Bicarbonate: 25.5 mEq/L — ABNORMAL HIGH (ref 20.0–24.0)
Drawn by: 129801
O2 Saturation: 95.4 %
TCO2: 21.2 mmol/L (ref 0–100)
pCO2 arterial: 33.6 mmHg — ABNORMAL LOW (ref 35.0–45.0)
pO2, Arterial: 70 mmHg — ABNORMAL LOW (ref 80.0–100.0)

## 2011-11-07 LAB — DIFFERENTIAL
Basophils Absolute: 0 10*3/uL (ref 0.0–0.1)
Eosinophils Absolute: 0 10*3/uL (ref 0.0–0.7)
Eosinophils Relative: 0 % (ref 0–5)
Lymphocytes Relative: 9 % — ABNORMAL LOW (ref 12–46)
Neutrophils Relative %: 86 % — ABNORMAL HIGH (ref 43–77)

## 2011-11-07 LAB — URINALYSIS, ROUTINE W REFLEX MICROSCOPIC
Hgb urine dipstick: NEGATIVE
Leukocytes, UA: NEGATIVE
Protein, ur: NEGATIVE mg/dL
Specific Gravity, Urine: 1.016 (ref 1.005–1.030)
Urobilinogen, UA: 0.2 mg/dL (ref 0.0–1.0)

## 2011-11-07 LAB — CBC
MCHC: 35.8 g/dL (ref 30.0–36.0)
Platelets: 197 10*3/uL (ref 150–400)
RDW: 13.4 % (ref 11.5–15.5)
WBC: 11 10*3/uL — ABNORMAL HIGH (ref 4.0–10.5)

## 2011-11-07 LAB — PROTIME-INR
INR: 6.32 (ref 0.00–1.49)
Prothrombin Time: 56.6 seconds — ABNORMAL HIGH (ref 11.6–15.2)

## 2011-11-07 LAB — LIPASE, BLOOD: Lipase: 20 U/L (ref 11–59)

## 2011-11-07 LAB — CARDIAC PANEL(CRET KIN+CKTOT+MB+TROPI)
CK, MB: 3.6 ng/mL (ref 0.3–4.0)
Relative Index: INVALID (ref 0.0–2.5)
Total CK: 32 U/L (ref 7–232)

## 2011-11-07 LAB — D-DIMER, QUANTITATIVE: D-Dimer, Quant: <0.22 ug/mL-FEU (ref 0.00–0.48)

## 2011-11-07 LAB — DIGOXIN LEVEL: Digoxin Level: 1.8 ng/mL (ref 0.8–2.0)

## 2011-11-07 LAB — PRO B NATRIURETIC PEPTIDE: Pro B Natriuretic peptide (BNP): 2734 pg/mL — ABNORMAL HIGH (ref 0–450)

## 2011-11-07 LAB — CORTISOL: Cortisol, Plasma: 23.7 ug/dL

## 2011-11-07 LAB — MAGNESIUM: Magnesium: 2 mg/dL (ref 1.5–2.5)

## 2011-11-07 MED ORDER — VANCOMYCIN HCL 1000 MG IV SOLR
750.0000 mg | Freq: Two times a day (BID) | INTRAVENOUS | Status: DC
  Administered 2011-11-07 – 2011-11-09 (×4): 750 mg via INTRAVENOUS
  Filled 2011-11-07 (×6): qty 750

## 2011-11-07 MED ORDER — DIGOXIN 250 MCG PO TABS
250.0000 ug | ORAL_TABLET | Freq: Every day | ORAL | Status: DC
  Administered 2011-11-07 – 2011-11-14 (×7): 250 ug via ORAL
  Filled 2011-11-07 (×10): qty 1

## 2011-11-07 MED ORDER — SENNOSIDES-DOCUSATE SODIUM 8.6-50 MG PO TABS
2.0000 | ORAL_TABLET | Freq: Every day | ORAL | Status: DC
  Administered 2011-11-09 – 2011-11-11 (×3): 2 via ORAL
  Filled 2011-11-07 (×6): qty 2

## 2011-11-07 MED ORDER — NITROGLYCERIN 0.4 MG SL SUBL: 0.4000 mg | SUBLINGUAL_TABLET | SUBLINGUAL | Status: DC | PRN

## 2011-11-07 MED ORDER — DILTIAZEM HCL ER 240 MG PO CP24
240.0000 mg | ORAL_CAPSULE | Freq: Every day | ORAL | Status: DC
  Administered 2011-11-08 – 2011-11-12 (×5): 240 mg via ORAL
  Filled 2011-11-07 (×10): qty 1

## 2011-11-07 MED ORDER — ASPIRIN 300 MG RE SUPP
300.0000 mg | RECTAL | Status: AC
  Filled 2011-11-07: qty 1

## 2011-11-07 MED ORDER — LEVALBUTEROL HCL 0.63 MG/3ML IN NEBU
0.6300 mg | INHALATION_SOLUTION | RESPIRATORY_TRACT | Status: DC | PRN
  Filled 2011-11-07 (×2): qty 3

## 2011-11-07 MED ORDER — VITAMIN D3 25 MCG (1000 UNIT) PO TABS
1000.0000 [IU] | ORAL_TABLET | Freq: Every day | ORAL | Status: DC
  Administered 2011-11-07 – 2011-11-12 (×6): 1000 [IU] via ORAL
  Filled 2011-11-07 (×8): qty 1

## 2011-11-07 MED ORDER — ROSUVASTATIN CALCIUM 40 MG PO TABS
40.0000 mg | ORAL_TABLET | Freq: Every day | ORAL | Status: DC
  Administered 2011-11-08 – 2011-11-15 (×7): 40 mg via ORAL
  Filled 2011-11-07 (×9): qty 1

## 2011-11-07 MED ORDER — DEXTROSE 5 % IV SOLN
1.0000 g | INTRAVENOUS | Status: DC
  Administered 2011-11-08 – 2011-11-09 (×2): 1 g via INTRAVENOUS
  Filled 2011-11-07 (×4): qty 10

## 2011-11-07 MED ORDER — BISOPROLOL FUMARATE 5 MG PO TABS
5.0000 mg | ORAL_TABLET | Freq: Every day | ORAL | Status: DC
  Administered 2011-11-07 – 2011-11-15 (×9): 5 mg via ORAL
  Filled 2011-11-07 (×10): qty 1

## 2011-11-07 MED ORDER — WARFARIN SODIUM 5 MG PO TABS: 5.0000 mg | ORAL_TABLET | Freq: Every day | ORAL | Status: DC

## 2011-11-07 MED ORDER — FUROSEMIDE 40 MG PO TABS
40.0000 mg | ORAL_TABLET | Freq: Two times a day (BID) | ORAL | Status: DC
  Filled 2011-11-07: qty 1

## 2011-11-07 MED ORDER — ASPIRIN 81 MG PO CHEW
324.0000 mg | CHEWABLE_TABLET | ORAL | Status: AC
  Administered 2011-11-07: 324 mg via ORAL
  Filled 2011-11-07: qty 4

## 2011-11-07 MED ORDER — BUDESONIDE 0.25 MG/2ML IN SUSP
0.2500 mg | Freq: Two times a day (BID) | RESPIRATORY_TRACT | Status: DC
  Administered 2011-11-07 – 2011-11-15 (×16): 0.25 mg via RESPIRATORY_TRACT
  Filled 2011-11-07 (×20): qty 2

## 2011-11-07 MED ORDER — FUROSEMIDE 10 MG/ML IJ SOLN
40.0000 mg | Freq: Two times a day (BID) | INTRAMUSCULAR | Status: DC
  Administered 2011-11-07 – 2011-11-10 (×7): 40 mg via INTRAVENOUS
  Filled 2011-11-07 (×9): qty 4

## 2011-11-07 MED ORDER — VANCOMYCIN HCL IN DEXTROSE 1-5 GM/200ML-% IV SOLN
1000.0000 mg | INTRAVENOUS | Status: DC
  Filled 2011-11-07: qty 200

## 2011-11-07 MED ORDER — PANTOPRAZOLE SODIUM 20 MG PO TBEC
20.0000 mg | DELAYED_RELEASE_TABLET | Freq: Every day | ORAL | Status: DC
  Administered 2011-11-07 – 2011-11-14 (×7): 20 mg via ORAL
  Filled 2011-11-07 (×10): qty 1

## 2011-11-07 MED ORDER — ISOSORBIDE MONONITRATE ER 60 MG PO TB24
60.0000 mg | ORAL_TABLET | Freq: Every day | ORAL | Status: DC
  Administered 2011-11-07 – 2011-11-14 (×7): 60 mg via ORAL
  Filled 2011-11-07 (×10): qty 1

## 2011-11-07 MED ORDER — OXYCODONE HCL 5 MG PO TABS
5.0000 mg | ORAL_TABLET | ORAL | Status: DC | PRN
  Administered 2011-11-11: 5 mg via ORAL
  Filled 2011-11-07: qty 1

## 2011-11-07 MED ORDER — IPRATROPIUM BROMIDE 0.02 % IN SOLN
500.0000 ug | Freq: Four times a day (QID) | RESPIRATORY_TRACT | Status: DC
  Administered 2011-11-07 – 2011-11-13 (×25): 500 ug via RESPIRATORY_TRACT
  Filled 2011-11-07 (×25): qty 2.5

## 2011-11-07 MED ORDER — DEXTROSE 5 % IV SOLN
500.0000 mg | INTRAVENOUS | Status: DC
  Administered 2011-11-08 – 2011-11-09 (×2): 500 mg via INTRAVENOUS
  Filled 2011-11-07 (×4): qty 500

## 2011-11-07 MED ORDER — METHYLPREDNISOLONE SODIUM SUCC 125 MG IJ SOLR
60.0000 mg | Freq: Four times a day (QID) | INTRAMUSCULAR | Status: DC
  Administered 2011-11-08 – 2011-11-09 (×7): 60 mg via INTRAVENOUS
  Filled 2011-11-07 (×11): qty 0.96

## 2011-11-07 MED ORDER — CLOPIDOGREL BISULFATE 75 MG PO TABS
75.0000 mg | ORAL_TABLET | Freq: Every day | ORAL | Status: DC
  Administered 2011-11-08 – 2011-11-15 (×7): 75 mg via ORAL
  Filled 2011-11-07 (×10): qty 1

## 2011-11-07 MED ORDER — VITAMIN D3 50 MCG (2000 UT) PO TABS: 1000.0000 mg | ORAL_TABLET | Freq: Every day | ORAL | Status: DC

## 2011-11-07 MED ORDER — SODIUM CHLORIDE 0.9 % IV SOLN
250.0000 mL | INTRAVENOUS | Status: DC | PRN
  Administered 2011-11-08: 250 mL via INTRAVENOUS

## 2011-11-07 MED ORDER — LEVALBUTEROL TARTRATE 45 MCG/ACT IN AERO
1.0000 | INHALATION_SPRAY | RESPIRATORY_TRACT | Status: DC | PRN
  Administered 2011-11-08: 2 via RESPIRATORY_TRACT
  Filled 2011-11-07 (×2): qty 15

## 2011-11-07 MED ORDER — LEVALBUTEROL HCL 0.63 MG/3ML IN NEBU
0.6300 mg | INHALATION_SOLUTION | RESPIRATORY_TRACT | Status: DC
  Administered 2011-11-07 – 2011-11-15 (×40): 0.63 mg via RESPIRATORY_TRACT
  Filled 2011-11-07 (×61): qty 3

## 2011-11-07 MED ORDER — HEPARIN SODIUM (PORCINE) 5000 UNIT/ML IJ SOLN
5000.0000 [IU] | Freq: Three times a day (TID) | INTRAMUSCULAR | Status: DC
  Filled 2011-11-07 (×6): qty 1

## 2011-11-07 NOTE — Progress Notes (Addendum)
ANTIBIOTIC CONSULT NOTE - INITIAL  Pharmacy Consult for Vanc, Azithromycin, Ceftriaxone Indication: r/o sepsis  Allergies  Allergen Reactions  . Morphine Itching    Patient Measurements:   Weight:  73.5 kg as of 11/07/11  Vital Signs: Temp: 97.9 F (36.6 C) (02/21 1614) Temp src: Axillary (02/21 1614) BP: 117/67 mmHg (02/21 1614) Pulse Rate: 83  (02/21 1614) Intake/Output from previous day:   Intake/Output from this shift:    Labs: No results found for this basename: WBC:3,HGB:3,PLT:3,LABCREA:3,CREATININE:3 in the last 72 hours The CrCl is unknown because both a height and weight (above a minimum accepted value) are required for this calculation. No results found for this basename: VANCOTROUGH:2,VANCOPEAK:2,VANCORANDOM:2,GENTTROUGH:2,GENTPEAK:2,GENTRANDOM:2,TOBRATROUGH:2,TOBRAPEAK:2,TOBRARND:2,AMIKACINPEAK:2,AMIKACINTROU:2,AMIKACIN:2, in the last 72 hours   Microbiology: No results found for this or any previous visit (from the past 720 hour(s)).  Medical History: Past Medical History  Diagnosis Date  . Pneumonia 12/06/10    healthcare-associated/ left lower lobe  . COPD (chronic obstructive pulmonary disease)     severe stage IV  . Atrial fibrillation     on Coumadin with St. Jude single-chamber pacemaker  . Diabetes mellitus     type 2; s/p Prednisone therapy for pneumonia 12/2010  . Coronary artery disease     s/p anterior STEMI 09/2009; cath. revealed mid LAD 40%, distal LAD 100%- PTCA, prox. RCA 40%, mid. RCA 100% with good collateral filling of PDA; EF 25%; NSTEMI 07/2011 - PCI/DES 100% LCX  . ST elevation (STEMI) myocardial infarction 01//11/12    anterior  . CHF (congestive heart failure)     EF 25% on 09/26/10; EF 50-55% and grade 1 diastolic dysfunction on ECHO 08/2010   . Hypertension   . Hyperlipidemia   . AAA (abdominal aortic aneurysm)     3 cm infrarenal abdominal aortic aneurysm per aorta ultrasound 03/2010  . Osteoarthritis     s/p bilat hip  arthroplasty  . Angina   . Ischemic cardiomyopathy     EF 35% LHC 11/12  . GERD (gastroesophageal reflux disease)   . Hypercholesteremia   . PVD (peripheral vascular disease)   . Anxiety    Assessment:  83 YOM admitted 2/21 with COPD exacerbation to start Vancomycin, Rocephin and Azithromycin per pharmacy to r/o sepsis.    Pending CMET, PCT, D-dimer, blood cultures, strep pneumo and legionella antigen  Afebrile  Goal of Therapy:  Vancomycin trough level 15-20 mcg/ml  Plan:   Vancomycin 1gm IV x 1 stat  Ceftriaxone 1gm IV q24h  Azithromycin 500 mg IV q24h  Await CMET for renal function, will f/u to redose abx  Geoffry Paradise Thi 11/07/2011,4:23 PM   Addendum 1900 on 11/07/2011  BMET returns - Scr 1.39.  Norm and CG CrCl 42 ml/min.  IV access is being obtained by 2000 per RN.   Plan:  Will d/c Vanc 1 gm x 1 ordered previoulsy since not yet given due to IV access issues.  Start Vancomycin 750 mg IV q12h.  Continue other antibiotics as ordered.   Patient with h/o afib (s/p St Jude single-chamber PM instertion 4/12).  Patient was on Coumadin 5 mg po daily PTA.  MD resumed home coumadin but STAT INR per P&T policy reveals INR of 6.32.  Coumadin now per pharmacy dosing.    Plan:  No Coumadin tonight.  Daily PT/INR.  Pharmacy will f/u in AM.   Geoffry Paradise, PharmD.   Pager:  [phone] 7:27 PM

## 2011-11-07 NOTE — Consult Note (Signed)
Reason for Consult: Failure to thrive, dyspnea on exertion in patient with known coronary artery disease Referring Physician: Zackarie [name] is an 78 y.o. male.  HPI:  [name] is a 78 yo Caucasian male with PMHx significant for CAD (s/p PTCA to distal LAD 09/2009 and diffuse nonobstructive disease; prior cath. 06/2007 revealed diffuse, nonobstructive CAD, medically managed), atrial fibrillation (s/p St. Jude single-chamber PM insertion 04/12 for tachy.-brady. syndrome, on Coumadin), chronic unspecified CHF (EF 25% per most recent cath, ECHO in 08/2010 revealed EF 50-55% and grade 1 diastolic dysfunction), HL, tobacco abuse, family history of CAD, AAA and COPD (stage IV) who presents to [facility] ED with chest pain.   Is an admitted in November of 2012. He ruled in for myocardial infarction and was also found to have a lateral wall myocardial infarction. He had a drug-eluting stent placed to his left circumflex artery.  His ejection fraction was 35% by left ventricular gram. He had inferior wall akinesis.  He notes that he's been gradually declining even before his myocardial infarction. There was some concern that he had started to decline after his heart attack but he informed that he started to decline well before his myocardial infarction in November, 2012.  The past week or so he has become more and more short of breath. He is extremely weak. He's had a productive cough. He has not been running any fever.  We are asked to see the patient over some concern that his pacemaker may not be functioning normally. Of note is that he has atrial fibrillation. He has a single-chamber pacemaker placed for sick sinus syndrome.  Past Medical History  Diagnosis Date  . Pneumonia 12/06/10    healthcare-associated/ left lower lobe  . COPD (chronic obstructive pulmonary disease)     severe stage IV  . Atrial fibrillation     on Coumadin with St. Jude single-chamber pacemaker  . Diabetes  mellitus     type 2; s/p Prednisone therapy for pneumonia 12/2010  . Coronary artery disease     s/p anterior STEMI 09/2009; cath. revealed mid LAD 40%, distal LAD 100%- PTCA, prox. RCA 40%, mid. RCA 100% with good collateral filling of PDA; EF 25%; NSTEMI 07/2011 - PCI/DES 100% LCX  . ST elevation (STEMI) myocardial infarction 01//11/12    anterior  . CHF (congestive heart failure)     EF 25% on 09/26/10; EF 50-55% and grade 1 diastolic dysfunction on ECHO 08/2010   . Hypertension   . Hyperlipidemia   . AAA (abdominal aortic aneurysm)     3 cm infrarenal abdominal aortic aneurysm per aorta ultrasound 03/2010  . Osteoarthritis     s/p bilat hip arthroplasty  . Angina   . Ischemic cardiomyopathy     EF 35% LHC 11/12  . GERD (gastroesophageal reflux disease)   . Hypercholesteremia   . PVD (peripheral vascular disease)   . Anxiety     Past Surgical History  Procedure Date  . Hip arthroplasty     s/p right 09/27/10 and s/p left 09/24/10  . Back surgery   . Knee surgery   . Pacemaker insertion 12/2010    St. Jude single-chamber   . Cardiac catheterization 09/2009, 06/2007    PTCA to distal LAD    Family History  Problem Relation Age of Onset  . Heart attack Mother 60  . Heart attack Father 43  . Cancer Other     siblings  . Heart attack Other  Social History:  reports that he quit smoking about 2 years ago. His smoking use included Cigarettes. He has a 75 pack-year smoking history. He has never used smokeless tobacco. He reports that he does not drink alcohol or use illicit drugs.  Medications:     . aspirin  324 mg Oral NOW   Or  . aspirin  300 mg Rectal NOW  . azithromycin  500 mg Intravenous Q24H  . bisoprolol  5 mg Oral Daily  . budesonide  0.25 mg Nebulization BID  . cefTRIAXone (ROCEPHIN)  IV  1 g Intravenous Q24H  . cholecalciferol  1,000 Units Oral Daily  . clopidogrel  75 mg Oral Q breakfast  . digoxin  250 mcg Oral Daily  . diltiazem  240 mg Oral  Daily  . furosemide  40 mg Oral BID  . heparin  5,000 Units Subcutaneous Q8H  . ipratropium  500 mcg Nebulization QID  . isosorbide mononitrate  60 mg Oral Daily  . levalbuterol  0.63 mg Nebulization Q4H  . methylPREDNISolone (SOLU-MEDROL) injection  60 mg Intravenous Q6H  . pantoprazole  20 mg Oral Q1200  . rosuvastatin  40 mg Oral Daily  . senna-docusate  2 tablet Oral QHS  . vancomycin  1,000 mg Intravenous STAT  . DISCONTD: Vitamin D3  1,000 mg Oral Daily  . DISCONTD: warfarin  5 mg Oral Daily      Allergies:  Allergies  Allergen Reactions  . Morphine Itching    @ROS @  Physical Exam: Blood pressure 113/73, pulse 66, temperature 97.1 F (36.2 C), temperature source Axillary, resp. rate 24, height 5\' 9"  (1.753 m), weight 159 lb 9.6 oz (72.394 kg), SpO2 95.00%. General: Well developed, well nourished, in no acute distress. Head: Normocephalic, atraumatic, sclera non-icteric, mucus membranes are moist,  Neck: Supple. Negative for carotid bruits. No JVD. Lungs: Bilateral wheezes.  Heart: Irregularly irregular. He has a soft systolic murmur. Abdomen: Soft, non-tender, non-distended with normoactive bowel sounds. No hepatomegaly. No rebound/guarding. No obvious abdominal masses. Msk:  Strength and tone appear normal for age. Extremities: No clubbing or cyanosis. No edema.  Distal pedal pulses are 2+ and equal bilaterally. Neuro: Alert and oriented X 3. Moves all extremities spontaneously. Psych:  Responds to questions appropriately with a normal affect.   EKG was not found. Telemetry: Atrial fibrillation with a controlled ventricular response. Labs:  [facility] 11/07/11 1720  CKTOTAL 32  CKMB 3.6  TROPONINI <0.30   Lab Results  Component Value Date   WBC 11.0* 11/07/2011   HGB 16.5 11/07/2011   HCT 46.1 11/07/2011   MCV 89.3 11/07/2011   PLT 197 11/07/2011    Lab 11/07/11 1715  NA 135  K 4.0  CL 94*  CO2 31  BUN 36*  CREATININE 1.39*  CALCIUM 9.4  PROT 6.4    BILITOT 0.7  ALKPHOS 65  ALT 26  AST 20  GLUCOSE 176*   Lab Results  Component Value Date   CHOL 105 10/11/2011   HDL 36.00* 10/11/2011   LDLCALC 39 10/11/2011   TRIG 149.0 10/11/2011   Lab Results  Component Value Date   DDIMER <0.22 11/07/2011    Assessment/Plan:  1. Failure to thrive: The patient presents with symptoms that are consistent with failure to thrive. He stated that his climax he started last summer-well before his myocardial infarction in November, 2012. He does have moderate to severe left ventricular dysfunction with an ejection fraction of around 35%. I think it would be reasonable to repeat  his echocardiogram for further assessment of his left ventricle systolic function.  His pro BNP is 2734.  He's currently being treated with bisoprolol 5 mg a day Digoxin 0.25 mg a day Cardizem 240 mg a day Lasix 40 mg  Twice a day  He has fairly severe COPD and it appears from previous office notes that he does not tolerate high-dose beta blocker. From a cardiac standpoint, we would rather have him on beta blockers instead of diltiazem since he has an ejection fraction of 35% but I'm not sure that he would tolerate it. I would appreciate critical care medicine input  as to whether they think he could tolerate higher dose of beta blocker and a decreased dose of diltiazem.  With his elevated BNP, we will try  some IV Lasix to see if this helps. We can try him on torsemide at home which may work better than furosemide.  Vesta Mixer, Montez Hageman., MD, [facility] 11/07/2011, 6:54 PM

## 2011-11-07 NOTE — Progress Notes (Signed)
Subjective:    Patient ID: [name], male    DOB: Oct 14, 1927, 78 y.o.   MRN: [id number]  HPI roblem list 1. 75 pack smoking hx. Quit 2010. Known CAD  - s/p CFx stent fall/winter 2012. Also chronic pace maker 2. COPD - Gold stage 3-18 November 2010 spirometry, Did not desaturate May 2012 185 feet x 3 laps. On atrovent, pulmicort nebs  - Spirometry 04/26/11 is fev1 1.06L/34%, ratio 47 and is unchanged  - Spirometry 11/07/2011 - fev1 0.89L/29% and ratio 35  3. AECOPD hx:   - March 2012 (LLL PNA),   - April 2012 - clear cxr  - Sep 02, 2011 - opd acute Rx with zpak and pred - Oct 31, 2011 - telephone Rx with doxy and pred - FEb 18. 2013  - acute with Dr Jonathon Bellows - pred taper boosted, CXR  - clear, cbc - normal  4. Body mass index is 23.92 kg/(m^2). on 11/07/2011    OV 11/07/2011  Acute visit to office on 11/07/2011 . Presents with daughter [name]. BAck in summer 2012 was attending rehab  And was only havig class 2 dyspnea against backdrop of Gold stage 3 copd (see above for feve1)  and was not desaturating with exertion.  Then, after thanksgiving 2012 had MI and stent to circumflex. Daughter feels since then gradual decline in functional status and never the same. Had AECOPD acute office visit 09/02/11 to our office and Rx with pred and zpak. Then seemed to improved even including a followup visit 09/20/11. Says was doing okay but they were noticing diminished appetite and some weight loss till 10/30/11 when he developed acute worsening dyspnea, cough increase, increase sputum volume without change in sputum color which remained white. He called our office 10/31/11 and was advise doxy and pred burst but was noticing persistent class 3-4 dyspnea, fatigue, weight loss, failure to trhive. Seen by Dr Shelle Iron 11/04/11 - cxr and cbc normal and given prednisone boost again. But this has not helped. He feels fatigued and not able to ADLs, Has poor appetite and not eating much.  OVerall 10# weight loss in 2 weeks.  Spirometry today shows worsening lung function by 200cc compared to August 2012 (Despite quitting smoking); currently in Gold stage 4 category  Denies chest pain, nausea, vomit, edema, orthopnea.  Daughter feels pacer maker is erratic and wants it checked    Of note he is on ACE Inhibitor .    Past Medical History  Diagnosis Date  . Pneumonia 12/06/10    healthcare-associated/ left lower lobe  . COPD (chronic obstructive pulmonary disease)     severe stage IV  . Atrial fibrillation     on Coumadin with St. Jude single-chamber pacemaker  . Diabetes mellitus     type 2; s/p Prednisone therapy for pneumonia 12/2010  . Coronary artery disease     s/p anterior STEMI 09/2009; cath. revealed mid LAD 40%, distal LAD 100%- PTCA, prox. RCA 40%, mid. RCA 100% with good collateral filling of PDA; EF 25%; NSTEMI 07/2011 - PCI/DES 100% LCX  . ST elevation (STEMI) myocardial infarction 01//11/12    anterior  . CHF (congestive heart failure)     EF 25% on 09/26/10; EF 50-55% and grade 1 diastolic dysfunction on ECHO 08/2010   . Hypertension   . Hyperlipidemia   . AAA (abdominal aortic aneurysm)     3 cm infrarenal abdominal aortic aneurysm per aorta ultrasound 03/2010  . Osteoarthritis     s/p bilat  hip arthroplasty  . Angina   . Ischemic cardiomyopathy     EF 35% LHC 11/12  . GERD (gastroesophageal reflux disease)   . Hypercholesteremia   . PVD (peripheral vascular disease)   . Anxiety      Family History  Problem Relation Age of Onset  . Heart attack Mother 46  . Heart attack Father 2  . Cancer Other     siblings  . Heart attack Other      History   Social History  . Marital Status: Widowed    Spouse Name: N/A    Number of Children: N/A  . Years of Education: N/A   Occupational History  . Not on file.   Social History Main Topics  . Smoking status: Former Smoker -- 1.5 packs/day for 50 years    Types: Cigarettes    Quit date: 09/02/2009  . Smokeless tobacco: Never  Used   Comment: smoked for 42yrs quit for 82yrs started back & smoked for 10ys  . Alcohol Use: No     occ - alcohol  . Drug Use: No  . Sexually Active: Not Currently   Other Topics Concern  . Not on file   Social History Narrative  . No narrative on file     Allergies  Allergen Reactions  . Morphine Itching     Outpatient Prescriptions Prior to Visit  Medication Sig Dispense Refill  . bisoprolol (ZEBETA) 5 MG tablet Take 5 mg by mouth daily.        . budesonide (PULMICORT) 0.5 MG/2ML nebulizer solution Take 2 mLs (0.5 mg total) by nebulization 2 (two) times daily as needed.  60 mL  6  . Cholecalciferol (VITAMIN D3) 2000 UNITS TABS Take by mouth daily.       . clopidogrel (PLAVIX) 75 MG tablet Take 1 tablet (75 mg total) by mouth daily with breakfast.  30 tablet  6  . digoxin (LANOXIN) 0.25 MG tablet Take 250 mcg by mouth daily.        Marland Kitchen diltiazem (DILACOR XR) 240 MG 24 hr capsule Take 1 capsule (240 mg total) by mouth daily.      Marland Kitchen doxycycline (VIBRA-TABS) 100 MG tablet Take 1 tablet twice a day x 5 days after meals; avoid sunlight  10 tablet  0  . furosemide (LASIX) 40 MG tablet Take 40 mg by mouth 2 (two) times daily.        Marland Kitchen ipratropium (ATROVENT) 0.02 % nebulizer solution Take 2.5 mLs (500 mcg total) by nebulization 4 (four) times daily.  300 mL  6  . isosorbide mononitrate (IMDUR) 60 MG 24 hr tablet Take 60 mg by mouth daily.        Marland Kitchen levalbuterol (XOPENEX HFA) 45 MCG/ACT inhaler Inhale 1-2 puffs into the lungs every 4 (four) hours as needed for wheezing.  1 Inhaler  12  . lisinopril (PRINIVIL,ZESTRIL) 2.5 MG tablet Take 1 tablet (2.5 mg total) by mouth daily.  30 tablet  6  . nitroGLYCERIN (NITROSTAT) 0.4 MG SL tablet Place 1 tablet (0.4 mg total) under the tongue every 5 (five) minutes as needed.  25 tablet  3  . oxycodone (OXY-IR) 5 MG capsule Take 5 mg by mouth every 4 (four) hours as needed. For pain      . Potassium Gluconate 550 MG TABS Take 1 tablet by mouth daily.         . predniSONE (DELTASONE) 10 MG tablet Take 4 tabs daily x 2 days, then 3  tabs daily x 2 days, then 2 tabs daily x 2 days, then 1 tab daily x 2 days, then stop.  20 tablet  0  . rosuvastatin (CRESTOR) 20 MG tablet Take 2 tablets (40 mg total) by mouth daily.  30 tablet  6  . sennosides-docusate sodium (SENOKOT-S) 8.6-50 MG tablet Take 2 tablets by mouth at bedtime.       Marland Kitchen warfarin (COUMADIN) 5 MG tablet Take 5 mg by mouth daily.            Review of Systems  Constitutional: Positive for appetite change and unexpected weight change. Negative for fever.  HENT: Negative.  Negative for ear pain, nosebleeds, congestion, sore throat, rhinorrhea, sneezing, trouble swallowing, dental problem, postnasal drip and sinus pressure.   Eyes: Negative.  Negative for redness and itching.  Respiratory: Positive for shortness of breath. Negative for cough, chest tightness and wheezing.   Cardiovascular: Negative.  Negative for palpitations and leg swelling.  Gastrointestinal: Negative.  Negative for nausea and vomiting.  Genitourinary: Negative.  Negative for dysuria.  Musculoskeletal: Negative.  Negative for joint swelling.  Skin: Negative.  Negative for rash.  Neurological: Positive for weakness. Negative for headaches.  Hematological: Bruises/bleeds easily.  Psychiatric/Behavioral: Negative.  Negative for dysphoric mood. The patient is not nervous/anxious.        Objective:   Physical Exam        Assessment & Plan:

## 2011-11-07 NOTE — Telephone Encounter (Signed)
I have 6 patients scheduled today. Is this my office patient ? If so, please add on to my schedule this AM

## 2011-11-07 NOTE — Telephone Encounter (Signed)
MR had opening at 11am - switched pt from CDY's schedule at 11am to MR.  Called spoke with [name], made her aware.  Will sign off and notify MR.

## 2011-11-07 NOTE — H&P (Addendum)
Hospital Admission Note Date: 11/07/2011 LOS :   days   Patient name: [name] Medical record number: [id number] Date of birth: [birth year] Age: 78 y.o. Gender: male PCP: Kari Baars, MD, MD  Medical Service: PCCM,MD  Brief Summary: copd exac, gold stage 4 copd, failure to thrive since MI nov 2012       [facility] Note     History of Present Illness:  Problem list  1. 75 pack smoking hx. Quit 2010. Known CAD - s/p CFx stent fall/winter 2012. Also chronic pace maker  2. COPD - Gold stage 3-18 November 2010 spirometry, Did not desaturate May 2012 185 feet x 3 laps. On atrovent, pulmicort nebs  - Spirometry 04/26/11 is fev1 1.06L/34%, ratio 47 and is unchanged  - Spirometry 11/07/2011 - fev1 0.89L/29% and ratio 35  3. AECOPD hx:  - March 2012 (LLL PNA),  - April 2012 - clear cxr  - Sep 02, 2011 - opd acute Rx with zpak and pred  - Oct 31, 2011 - telephone Rx with doxy and pred  - FEb 18. 2013 - acute with Dr Jonathon Bellows - pred taper boosted, CXR - clear, cbc - normal   4. Body mass index is 23.92 kg/(m^2). on 11/07/2011  And this represents 10# weight loss   ADMIT 11/07/2011  Acute visit to office on 11/07/2011 . Presents with daughter [name]. BAck in summer 2012 was attending rehab And was only havig class 2 dyspnea against backdrop of Gold stage 3 copd (see above for feve1) and was not desaturating with exertion. Then, after thanksgiving 2012 had MI and stent to circumflex. Daughter feels since then gradual decline in functional status and never the same. Had AECOPD acute office visit 09/02/11 to our office and Rx with pred and zpak. Then seemed to improved even including a followup visit 09/20/11. Says was doing okay but they were noticing diminished appetite and some weight loss till 10/30/11 when he developed acute worsening dyspnea, cough increase, increase sputum volume without change in sputum color which remained white. He called our office 10/31/11 and was  advise doxy and pred burst but was noticing persistent class 3-4 dyspnea, fatigue, weight loss, failure to trhive. Seen by Dr Shelle Iron 11/04/11 - cxr and cbc normal and given prednisone boost again. But this has not helped. He feels fatigued and not able to ADLs, Has poor appetite and not eating much. OVerall 10# weight loss in 2 weeks. Spirometry today shows worsening lung function by 200cc compared to August 2012 (Despite quitting smoking); currently in Gold stage 4 category  Denies chest pain, nausea, vomit, edema, orthopnea.   Daughter feels pacer maker is erratic and wants it checked   Of note he is on ACE Inhibitor .     Lines / Drains: None  Cultures: Blood PCT Lactate UA   Antibiotics: Vanc Ceftriaxone Azithro   Tests / Events: Cards consult 11/07/2011    Past Medical History  Diagnosis Date  . Pneumonia 12/06/10    healthcare-associated/ left lower lobe  . COPD (chronic obstructive pulmonary disease)     severe stage IV  . Atrial fibrillation     on Coumadin with St. Jude single-chamber pacemaker  . Diabetes mellitus     type 2; s/p Prednisone therapy for pneumonia 12/2010  . Coronary artery disease     s/p anterior STEMI 09/2009; cath. revealed mid LAD 40%, distal LAD 100%- PTCA, prox. RCA 40%, mid. RCA 100% with good collateral filling of  PDA; EF 25%; NSTEMI 07/2011 - PCI/DES 100% LCX  . ST elevation (STEMI) myocardial infarction 01//11/12    anterior  . CHF (congestive heart failure)     EF 25% on 09/26/10; EF 50-55% and grade 1 diastolic dysfunction on ECHO 08/2010   . Hypertension   . Hyperlipidemia   . AAA (abdominal aortic aneurysm)     3 cm infrarenal abdominal aortic aneurysm per aorta ultrasound 03/2010  . Osteoarthritis     s/p bilat hip arthroplasty  . Angina   . Ischemic cardiomyopathy     EF 35% LHC 11/12  . GERD (gastroesophageal reflux disease)   . Hypercholesteremia   . PVD (peripheral vascular disease)   . Anxiety     Past Surgical  History  Procedure Date  . Hip arthroplasty     s/p right 09/27/10 and s/p left 09/24/10  . Back surgery   . Knee surgery   . Pacemaker insertion 12/2010    St. Jude single-chamber   . Cardiac catheterization 09/2009, 06/2007    PTCA to distal LAD    Prior to Admission medications   Medication Sig Start Date End Date Taking? Authorizing Provider  bisoprolol (ZEBETA) 5 MG tablet Take 5 mg by mouth daily.      Historical Provider, MD  budesonide (PULMICORT) 0.5 MG/2ML nebulizer solution Take 2 mLs (0.5 mg total) by nebulization 2 (two) times daily as needed. 04/05/11   Kalman Shan, MD  Cholecalciferol (VITAMIN D3) 2000 UNITS TABS Take by mouth daily.     Historical Provider, MD  clopidogrel (PLAVIX) 75 MG tablet Take 1 tablet (75 mg total) by mouth daily with breakfast. 08/15/11 08/14/12  Ok Anis, NP  digoxin (LANOXIN) 0.25 MG tablet Take 250 mcg by mouth daily.      Historical Provider, MD  diltiazem (DILACOR XR) 240 MG 24 hr capsule Take 1 capsule (240 mg total) by mouth daily. 08/27/11   Beatrice Lecher, PA  doxycycline (VIBRA-TABS) 100 MG tablet Take 1 tablet twice a day x 5 days after meals; avoid sunlight 10/31/11 10/30/12  Kalman Shan, MD  furosemide (LASIX) 40 MG tablet Take 40 mg by mouth 2 (two) times daily.   08/30/11   Beatrice Lecher, PA  ipratropium (ATROVENT) 0.02 % nebulizer solution Take 2.5 mLs (500 mcg total) by nebulization 4 (four) times daily. 04/05/11   Kalman Shan, MD  isosorbide mononitrate (IMDUR) 60 MG 24 hr tablet Take 60 mg by mouth daily.      Historical Provider, MD  levalbuterol [facility] HFA) 45 MCG/ACT inhaler Inhale 1-2 puffs into the lungs every 4 (four) hours as needed for wheezing. 09/02/11 09/01/12  Leslye Peer, MD  lisinopril (PRINIVIL,ZESTRIL) 2.5 MG tablet Take 1 tablet (2.5 mg total) by mouth daily. 08/15/11 08/14/12  Ok Anis, NP  nitroGLYCERIN (NITROSTAT) 0.4 MG SL tablet Place 1 tablet (0.4 mg total) under the  tongue every 5 (five) minutes as needed. 08/15/11   Ok Anis, NP  oxycodone (OXY-IR) 5 MG capsule Take 5 mg by mouth every 4 (four) hours as needed. For pain    Historical Provider, MD  Potassium Gluconate 550 MG TABS Take 1 tablet by mouth daily.      Historical Provider, MD  predniSONE (DELTASONE) 10 MG tablet Take 4 tabs daily x 2 days, then 3 tabs daily x 2 days, then 2 tabs daily x 2 days, then 1 tab daily x 2 days, then stop. 11/04/11   Barbaraann Share, MD  rosuvastatin (CRESTOR) 20 MG tablet Take 2 tablets (40 mg total) by mouth daily. 08/15/11   Ok Anis, NP  sennosides-docusate sodium (SENOKOT-S) 8.6-50 MG tablet Take 2 tablets by mouth at bedtime.     Historical Provider, MD  warfarin (COUMADIN) 5 MG tablet Take 5 mg by mouth daily.  04/24/11   Dolores Patty, MD    Allergies Allergies  Allergen Reactions  . Morphine Itching    Family History Family History  Problem Relation Age of Onset  . Heart attack Mother 42  . Heart attack Father 7  . Cancer Other     siblings  . Heart attack Other     Social History  reports that he quit smoking about 2 years ago. His smoking use included Cigarettes. He has a 75 pack-year smoking history. He has never used smokeless tobacco. He reports that he does not drink alcohol or use illicit drugs.  Review Of Systems:   Vital Signs: Pulse Rate:  [49] 49  (02/21 1122) BP: (98)/(64) 98/64 mmHg (02/21 1122) SpO2:  [96 %] 96 % (02/21 1122) Weight:  [73.483 kg (162 lb)] 73.483 kg (162 lb) (02/21 1122)    Physical Examination:  There were no vitals taken for this visit.  General Appearance:   vEry deconditioned and chronic unwell looking compared to August 2012. Sitting on wheel chair. Looks fatigues and worn out and lost weight   Head:    Normocephalic, without obvious abnormality, atraumatic  Eyes:    PERRL, conjunctiva/corneas clear, EOM's intact, fundi    benign, both eyes       Ears:    Normal TM's and  external ear canals, both ears  Nose:   Nares normal, septum midline, mucosa normal, no drainage   or sinus tenderness  Throat:   Lips, mucosa, and tongue normal; teeth and gums normal  Neck:   Supple, symmetrical, trachea midline, no adenopathy;       thyroid:  No enlargement/tenderness/nodules; no carotid   bruit or JVD  Back:     Symmetric, no curvature, ROM normal, no CVA tenderness  Lungs:     Clear to auscultation bilaterally, respirations unlabored  Chest wall:  Barrell chest  Heart:    Regular rate and rhythm, S1 and S2 normal, no murmur, rub   or gallop  Abdomen:     Soft, non-tender, bowel sounds active all four quadrants,    no masses, no organomegaly  Genitalia:  Not examined  Rectal:   Not examined  Extremities:   Extremities normal, atraumatic, no cyanosis or edema  Pulses:   2+ and symmetric all extremities  Skin:   Skin color, texture, turgor normal, no rashes or lesions  Lymph nodes:   Cervical, supraclavicular, and axillary nodes normal  Neurologic:   CNII-XII intact. Normal strength, sensation and reflexes      throughout        Labs No results found for this or any previous visit (from the past 48 hour(s)).  Imaging results:  No results found.   Assessment & Plan  Patient Active Hospital Problem List: COPD exacerbation (11/07/2011)   Assessment: REcurrent AECOPD but currently clinically not sure if he is slow to recover from AECOPD or true AECOPD or independent problem or not   Plan: Rx for AECOPD with nebs, antibiotics and steroids. Rule out sepsis. Hold out ace inhibitor due to recurrent AECOPD. Cards to start ARB COPD, severe (12/26/2010)   Assessment: SPirometry shows worsening to gold stage 4  Plan: check alpha 1 Failure to Thrive (11/07/2011)  Assesmsent: New since past few weeks. Unclear why  PLan: Rx others and monitor, >? Due to copd related decline, check dig level Atrial fibrillation (12/26/2010)   Assessment: He says pacer not working  well   Plan: call cards consult, check ekg CHF (congestive heart failure) (12/26/2010) - ef 55% in dec 2011 but had stent due to mI in nov 2012   Assessment: appears euvolemic   Plan: check bnp and rule out MI. Hold ace inhibitor due to recurrent aecopd but get echo. Cards to decide on ARB. For now continue beta blocker =, antiplatlelt agent, digoxin but check dig level Pacemaker (04/02/2011)   Assessment: Dtr feels pacer not working well   Plan: cards consult, check dig level CAD (coronary artery disease) (08/15/2011)   Assessment: clinically no chest pain   Plan: serial enzymes Diabetes mellitus (12/26/2010)   Assessment: appears stable   Plan: ssi protocol Abdominal aortic aneurysm (12/26/2010)   Assessment: not active, do not know details   Plan: monitor   FULL CODE  Dr. Kalman Shan, M.D., [facility]C.P Pulmonary and Critical Care Medicine Staff Physician [facility] Pager: [phone], If no answer or between  15:00h - 7:00h: call [phone]  11/07/2011 2:02 PM

## 2011-11-07 NOTE — Progress Notes (Signed)
CRITICAL VALUE ALERT  Critical value received: INR 6.32  Date of notification: 11/07/11  Time of notification: 1734  Critical value read back:yes  Nurse who received alert:  JS  MD notified (1st page):  Dr Judie Petit. Ramaswamy's office;then E-Link @ [phone] Time of first page:  1735  MD notified (2nd page):  Time of second page:  Responding MD: Dr. Melvenia Beam  Time MD responded:  [phone]

## 2011-11-08 ENCOUNTER — Encounter (HOSPITAL_COMMUNITY): Payer: Medicare Other

## 2011-11-08 DIAGNOSIS — I4891 Unspecified atrial fibrillation: Secondary | ICD-10-CM

## 2011-11-08 DIAGNOSIS — Z658 Other specified problems related to psychosocial circumstances: Secondary | ICD-10-CM

## 2011-11-08 DIAGNOSIS — J449 Chronic obstructive pulmonary disease, unspecified: Secondary | ICD-10-CM

## 2011-11-08 DIAGNOSIS — I517 Cardiomegaly: Secondary | ICD-10-CM

## 2011-11-08 LAB — CARDIAC PANEL(CRET KIN+CKTOT+MB+TROPI)
CK, MB: 3.3 ng/mL (ref 0.3–4.0)
Relative Index: INVALID (ref 0.0–2.5)
Relative Index: INVALID (ref 0.0–2.5)
Total CK: 25 U/L (ref 7–232)

## 2011-11-08 LAB — CBC
HCT: 43.6 % (ref 39.0–52.0)
Hemoglobin: 15.2 g/dL (ref 13.0–17.0)
MCH: 30.9 pg (ref 26.0–34.0)
MCHC: 34.9 g/dL (ref 30.0–36.0)
MCV: 88.6 fL (ref 78.0–100.0)
RBC: 4.92 MIL/uL (ref 4.22–5.81)

## 2011-11-08 LAB — BASIC METABOLIC PANEL
BUN: 37 mg/dL — ABNORMAL HIGH (ref 6–23)
CO2: 30 mEq/L (ref 19–32)
Calcium: 8.7 mg/dL (ref 8.4–10.5)
Creatinine, Ser: 1.33 mg/dL (ref 0.50–1.35)
GFR calc non Af Amer: 48 mL/min — ABNORMAL LOW (ref >90)
Glucose, Bld: 183 mg/dL — ABNORMAL HIGH (ref 70–99)
Sodium: 133 mEq/L — ABNORMAL LOW (ref 135–145)

## 2011-11-08 LAB — LEGIONELLA ANTIGEN, URINE

## 2011-11-08 LAB — STREP PNEUMONIAE URINARY ANTIGEN: Strep Pneumo Urinary Antigen: NEGATIVE

## 2011-11-08 LAB — PHOSPHORUS: Phosphorus: 4.4 mg/dL (ref 2.3–4.6)

## 2011-11-08 MED ORDER — POTASSIUM CHLORIDE CRYS ER 20 MEQ PO TBCR
20.0000 meq | EXTENDED_RELEASE_TABLET | Freq: Two times a day (BID) | ORAL | Status: DC
  Administered 2011-11-08 – 2011-11-09 (×4): 20 meq via ORAL
  Filled 2011-11-08 (×7): qty 1

## 2011-11-08 NOTE — Progress Notes (Signed)
Subjective:  Feels better this am.  No chest pain.  Breathing improved since on oxygen. Rhythm atrial fib with paced ventricular beats and short runs of 3 and 4 beats of VT asymptomatic.  Objective:  Vital Signs in the last 24 hours: Temp:  [97.1 F (36.2 C)-97.9 F (36.6 C)] 97.4 F (36.3 C) (02/22 0525) Pulse Rate:  [49-83] 69  (02/22 0525) Resp:  [20-24] 20  (02/22 0525) BP: (98-117)/(51-73) 115/54 mmHg (02/22 0525) SpO2:  [89 %-98 %] 98 % (02/22 0525) FiO2 (%):  [1 %] 1 % (02/22 0525) Weight:  [159 lb 4.8 oz (72.258 kg)-162 lb (73.483 kg)] 159 lb 4.8 oz (72.258 kg) (02/22 0525)  Intake/Output from previous day: 02/21 0701 - 02/22 0700 In: 634 [P.O.:240; I.V.:240; IV Piggyback:154] Out: 400 [Urine:400] Intake/Output from this shift:       . aspirin  324 mg Oral NOW   Or  . aspirin  300 mg Rectal NOW  . azithromycin  500 mg Intravenous Q24H  . bisoprolol  5 mg Oral Daily  . budesonide  0.25 mg Nebulization BID  . cefTRIAXone (ROCEPHIN)  IV  1 g Intravenous Q24H  . cholecalciferol  1,000 Units Oral Daily  . clopidogrel  75 mg Oral Q breakfast  . digoxin  250 mcg Oral Daily  . diltiazem  240 mg Oral Daily  . furosemide  40 mg Intravenous BID  . ipratropium  500 mcg Nebulization QID  . isosorbide mononitrate  60 mg Oral Daily  . levalbuterol  0.63 mg Nebulization Q4H  . methylPREDNISolone (SOLU-MEDROL) injection  60 mg Intravenous Q6H  . pantoprazole  20 mg Oral Q1200  . rosuvastatin  40 mg Oral Daily  . senna-docusate  2 tablet Oral QHS  . vancomycin  750 mg Intravenous Q12H  . DISCONTD: furosemide  40 mg Oral BID  . DISCONTD: heparin  5,000 Units Subcutaneous Q8H  . DISCONTD: vancomycin  1,000 mg Intravenous STAT  . DISCONTD: Vitamin D3  1,000 mg Oral Daily  . DISCONTD: warfarin  5 mg Oral Daily      Physical Exam: The patient appears to be in no distress.  Head and neck exam reveals that the pupils are equal and reactive.  The extraocular movements are  full.  There is no scleral icterus.  Mouth and pharynx are benign.  No lymphadenopathy.  No carotid bruits.  The jugular venous pressure is normal.  Thyroid is not enlarged or tender.  Chest reveals mild expiratory wheezes.  No respiratory distress.  Heart reveals no abnormal lift or heave.  First and second heart sounds are normal.  There is no murmur gallop rub or click.  The abdomen is soft and nontender.  Bowel sounds are normoactive.  There is no hepatosplenomegaly or mass.  There are no abdominal bruits.  Extremities reveal no phlebitis or edema.  Pedal pulses are good.  There is no cyanosis or clubbing.  Neurologic exam is normal strength and no lateralizing weakness.  No sensory deficits.  Integument reveals no rash  Lab Results:  Basename 11/08/11 0430 11/07/11 1715  WBC 10.1 11.0*  HGB 15.2 16.5  PLT 177 197    Basename 11/08/11 0430 11/07/11 1715  NA 133* 135  K 3.4* 4.0  CL 93* 94*  CO2 30 31  GLUCOSE 183* 176*  BUN 37* 36*  CREATININE 1.33 1.39*    Basename 11/08/11 0043 11/07/11 1720  TROPONINI <0.30 <0.30   Hepatic Function Panel  Basename 11/07/11 1715  PROT  6.4  ALBUMIN 3.9  AST 20  ALT 26  ALKPHOS 65  BILITOT 0.7  BILIDIR --  IBILI --   No results found for this basename: CHOL in the last 72 hours No results found for this basename: PROTIME in the last 72 hours  Imaging: Imaging results have been reviewed  Cardiac Studies: 2 D echo just being completed. Assessment/Plan:  Patient Active Hospital Problem List:  Acute on chronic systolic CHF     Assessment: Clinically improved overnight     Plan:  Await echo results.  Continue present meds Ventricular arrhythmia     Assessment: possibly due to low K     Plan: Replete K, Hold digoxin today, check digoxin level again in am. May need lower maintenance dose.    LOS: 1 day    [name] 11/08/2011, 8:30 AM

## 2011-11-08 NOTE — Progress Notes (Signed)
ANTICOAGULATION CONSULT NOTE - Follow Up Consult  Pharmacy Consult for warfarin Indication: hx Afib, s/p St Jude single chamber PM insertion  Allergies  Allergen Reactions  . Morphine Itching    Patient Measurements: Height: 5\' 9"  (175.3 cm) Weight: 159 lb 4.8 oz (72.258 kg) IBW/kg (Calculated) : 70.7   Vital Signs: Temp: 97.4 F (36.3 C) (02/22 0525) Temp src: Oral (02/22 0525) BP: 115/54 mmHg (02/22 0525) Pulse Rate: 69  (02/22 0525)  Labs:  Basename 11/08/11 0905 11/08/11 0430 11/08/11 0043 11/07/11 1720 11/07/11 1715  HGB -- 15.2 -- -- 16.5  HCT -- 43.6 -- -- 46.1  PLT -- 177 -- -- 197  APTT -- -- -- -- 54*  LABPROT -- 61.5* -- -- 56.6*  INR -- 7.02* -- -- 6.32*  HEPARINUNFRC -- -- -- -- --  CREATININE -- 1.33 -- -- 1.39*  CKTOTAL 25 -- 27 32 --  CKMB 3.3 -- 3.1 3.6 --  TROPONINI <0.30 -- <0.30 <0.30 --   Estimated Creatinine Clearance: 43.6 ml/min (by C-G formula based on Cr of 1.33).   Medications:  Scheduled:    . aspirin  324 mg Oral NOW   Or  . aspirin  300 mg Rectal NOW  . azithromycin  500 mg Intravenous Q24H  . bisoprolol  5 mg Oral Daily  . budesonide  0.25 mg Nebulization BID  . cefTRIAXone (ROCEPHIN)  IV  1 g Intravenous Q24H  . cholecalciferol  1,000 Units Oral Daily  . clopidogrel  75 mg Oral Q breakfast  . digoxin  250 mcg Oral Daily  . diltiazem  240 mg Oral Daily  . furosemide  40 mg Intravenous BID  . ipratropium  500 mcg Nebulization QID  . isosorbide mononitrate  60 mg Oral Daily  . levalbuterol  0.63 mg Nebulization Q4H  . methylPREDNISolone (SOLU-MEDROL) injection  60 mg Intravenous Q6H  . pantoprazole  20 mg Oral Q1200  . potassium chloride  20 mEq Oral BID  . rosuvastatin  40 mg Oral Daily  . senna-docusate  2 tablet Oral QHS  . vancomycin  750 mg Intravenous Q12H  . DISCONTD: furosemide  40 mg Oral BID  . DISCONTD: heparin  5,000 Units Subcutaneous Q8H  . DISCONTD: vancomycin  1,000 mg Intravenous STAT  . DISCONTD: Vitamin  D3  1,000 mg Oral Daily  . DISCONTD: warfarin  5 mg Oral Daily    Assessment: INR now >7. Home dose of warfarin was 5mg  daily PTA. CBC stable. No reported bleeding  Goal of Therapy:  INR 2-3   Plan:  Continue to hold warfarin Monitor closely for signs and symptoms of bleeding Reversal per Md if necessary   Hessie Knows, PharmD, BCPS pager [phone] 11/08/2011 1:31 PM

## 2011-11-08 NOTE — Progress Notes (Signed)
INITIAL ADULT NUTRITION ASSESSMENT Date: 11/08/2011   Time: 1:26 PM Reason for Assessment: consult  ASSESSMENT: Male 78 y.o.  Dx: COPD exacerbation  Hx:  Past Medical History  Diagnosis Date  . Pneumonia 12/06/10    healthcare-associated/ left lower lobe  . COPD (chronic obstructive pulmonary disease)     severe stage IV  . Atrial fibrillation     on Coumadin with St. Jude single-chamber pacemaker  . Diabetes mellitus     type 2; s/p Prednisone therapy for pneumonia 12/2010  . Coronary artery disease     s/p anterior STEMI 09/2009; cath. revealed mid LAD 40%, distal LAD 100%- PTCA, prox. RCA 40%, mid. RCA 100% with good collateral filling of PDA; EF 25%; NSTEMI 07/2011 - PCI/DES 100% LCX  . ST elevation (STEMI) myocardial infarction 01//11/12    anterior  . CHF (congestive heart failure)     EF 25% on 09/26/10; EF 50-55% and grade 1 diastolic dysfunction on ECHO 08/2010   . Hypertension   . Hyperlipidemia   . AAA (abdominal aortic aneurysm)     3 cm infrarenal abdominal aortic aneurysm per aorta ultrasound 03/2010  . Osteoarthritis     s/p bilat hip arthroplasty  . Angina   . Ischemic cardiomyopathy     EF 35% LHC 11/12  . GERD (gastroesophageal reflux disease)   . Hypercholesteremia   . PVD (peripheral vascular disease)   . Anxiety    Past Surgical History  Procedure Date  . Hip arthroplasty     s/p right 09/27/10 and s/p left 09/24/10  . Back surgery   . Knee surgery   . Pacemaker insertion 12/2010    St. Jude single-chamber   . Cardiac catheterization 09/2009, 06/2007    PTCA to distal LAD   Related Meds:  Scheduled Meds:   . aspirin  324 mg Oral NOW   Or  . aspirin  300 mg Rectal NOW  . azithromycin  500 mg Intravenous Q24H  . bisoprolol  5 mg Oral Daily  . budesonide  0.25 mg Nebulization BID  . cefTRIAXone (ROCEPHIN)  IV  1 g Intravenous Q24H  . cholecalciferol  1,000 Units Oral Daily  . clopidogrel  75 mg Oral Q breakfast  . digoxin  250 mcg Oral  Daily  . diltiazem  240 mg Oral Daily  . furosemide  40 mg Intravenous BID  . ipratropium  500 mcg Nebulization QID  . isosorbide mononitrate  60 mg Oral Daily  . levalbuterol  0.63 mg Nebulization Q4H  . methylPREDNISolone (SOLU-MEDROL) injection  60 mg Intravenous Q6H  . pantoprazole  20 mg Oral Q1200  . potassium chloride  20 mEq Oral BID  . rosuvastatin  40 mg Oral Daily  . senna-docusate  2 tablet Oral QHS  . vancomycin  750 mg Intravenous Q12H  . DISCONTD: furosemide  40 mg Oral BID  . DISCONTD: heparin  5,000 Units Subcutaneous Q8H  . DISCONTD: vancomycin  1,000 mg Intravenous STAT  . DISCONTD: Vitamin D3  1,000 mg Oral Daily  . DISCONTD: warfarin  5 mg Oral Daily   Continuous Infusions:  PRN Meds:.sodium chloride, levalbuterol, levalbuterol, nitroGLYCERIN, oxyCODONE   Ht: 5\' 9"  (175.3 cm)  Wt: 159 lb 4.8 oz (72.258 kg)  Ideal Wt: 72 kg % Ideal Wt: 100%  Usual Wt: 76.8 % Usual Wt: 94%  Body mass index is 23.52 kg/(m^2).  Food/Nutrition Related Hx: poor PO PTA, 10 lbs wt loss in 2 weeks  Labs:  CMP  Component Value Date/Time   NA 133* 11/08/2011 0430   K 3.4* 11/08/2011 0430   CL 93* 11/08/2011 0430   CO2 30 11/08/2011 0430   GLUCOSE 183* 11/08/2011 0430   BUN 37* 11/08/2011 0430   CREATININE 1.33 11/08/2011 0430   CALCIUM 8.7 11/08/2011 0430   PROT 6.4 11/07/2011 1715   ALBUMIN 3.9 11/07/2011 1715   AST 20 11/07/2011 1715   ALT 26 11/07/2011 1715   ALKPHOS 65 11/07/2011 1715   BILITOT 0.7 11/07/2011 1715   GFRNONAA 48* 11/08/2011 0430   GFRAA 56* 11/08/2011 0430    CBC    Component Value Date/Time   WBC 10.1 11/08/2011 0430   RBC 4.92 11/08/2011 0430   HGB 15.2 11/08/2011 0430   HCT 43.6 11/08/2011 0430   PLT 177 11/08/2011 0430   MCV 88.6 11/08/2011 0430   MCH 30.9 11/08/2011 0430   MCHC 34.9 11/08/2011 0430   RDW 13.3 11/08/2011 0430   LYMPHSABS 1.0 11/07/2011 1715   MONOABS 0.6 11/07/2011 1715   EOSABS 0.0 11/07/2011 1715   BASOSABS 0.0 11/07/2011 1715     Intake: 100% x2 Output:   Intake/Output Summary (Last 24 hours) at 11/08/11 1329 Last data filed at 11/08/11 1100  Gross per 24 hour  Intake    874 ml  Output    400 ml  Net    474 ml    Diet Order: Carb Control  Supplements/Tube Feeding: none at this time  IVF:    Estimated Nutritional Needs:   Kcal: 2015-2300 kcal Protein: 86-100g Fluid: >2.0 L/day  Pt admitted with COPD exacerbation, FTT since MI in Nov 2012.   Since admission pt has consumed 100% of his meals (x2).  Pt unavailable at time of visit to discuss wt loss. No family in room. Per chart, pt with 10 lbs wt loss in 2 weeks due to fatigue, decreased ability for ADLs, and COPD.  10 lbs lost represents 6% undesired wt loss for pt, however not significant for malnutrition especially given apparent improvement since admission.  NUTRITION DIAGNOSIS: Unintentional wt loss  RELATED TO: shortness of breath, fatigue, decreased functional status  AS EVIDENCE BY: dx of FTT, 10 lbs wt loss in 2 weeks  MONITORING/EVALUATION(Goals): 1.  Food/Beverage; pt continues to consume >75% of meals on average.  Intake per his usual  EDUCATION NEEDS: -No education needs identified at this time  INTERVENTION: 1.  General healthful diet; encourage intake if needed.  No supplements at this time.  Will continue to monitor to adequate intake.  Dietitian #: [phone]  DOCUMENTATION CODES Per approved criteria  -Not Applicable    Loyce Dys [facility] 11/08/2011, 1:26 PM

## 2011-11-08 NOTE — Patient Instructions (Signed)
See admit h and p

## 2011-11-08 NOTE — Progress Notes (Signed)
  Echocardiogram 2D Echocardiogram has been performed.  Jorje Guild [facility] 11/08/2011, 8:29 AM

## 2011-11-08 NOTE — Progress Notes (Addendum)
Hospital Admission Note Date: 11/08/2011 LOS :   days   Patient name: [name] Medical record number: [id number] Date of birth: 07-09-1928 Age: 78 y.o. Gender: male PCP: Kari Baars, MD, MD  Medical Service: PCCM,MD  Brief Summary: copd exac, gold stage 4 copd, failure to thrive since MI nov 2012       [facility] Critical Care Note     BRIEF:  Problem list  1. 75 pack smoking hx. Quit 2010. Known CAD - s/p CFx stent fall/winter 2012. Also chronic pace maker  2. COPD - Gold stage 3-18 November 2010 spirometry, Did not desaturate May 2012 185 feet x 3 laps. On atrovent, pulmicort nebs  - Spirometry 04/26/11 is fev1 1.06L/34%, ratio 47 and is unchanged  - Spirometry 11/07/2011 - fev1 0.89L/29% and ratio 35  3. AECOPD hx:  - March 2012 (LLL PNA),  - April 2012 - clear cxr  - Sep 02, 2011 - opd acute Rx with zpak and pred  - Oct 31, 2011 - telephone Rx with doxy and pred  - FEb 18. 2013 - acute with Dr Jonathon Bellows - pred taper boosted, CXR - clear, cbc - normal   4. Body mass index is 23.92 kg/(m^2). on 11/07/2011  And this represents 10# weight loss  Acute visit to office on 11/07/2011 . Presents with daughter [name]. BAck in summer 2012 was attending rehab And was only havig class 2 dyspnea against backdrop of Gold stage 3 copd (see above for feve1) and was not desaturating with exertion. Then, after thanksgiving 2012 had MI and stent to circumflex. Daughter feels since then gradual decline in functional status and never the same. Had AECOPD acute office visit 09/02/11 to our office and Rx with pred and zpak. Then seemed to improved even including a followup visit 09/20/11. Says was doing okay but they were noticing diminished appetite and some weight loss till 10/30/11 when he developed acute worsening dyspnea, cough increase, increase sputum volume without change in sputum color which remained white. He called our office 10/31/11 and was advise doxy and pred burst but was  noticing persistent class 3-4 dyspnea, fatigue, weight loss, failure to trhive. Seen by Dr Shelle Iron 11/04/11 - cxr and cbc normal and given prednisone boost again. But this has not helped. He feels fatigued and not able to ADLs, Has poor appetite and not eating much. OVerall 10# weight loss in 2 weeks. Spirometry today shows worsening lung function by 200cc compared to August 2012 (Despite quitting smoking); currently in Gold stage 4 category  Denies chest pain, nausea, vomit, edema, orthopnea.   Daughter feels pacer maker is erratic and wants it checked   Of note he is on ACE Inhibitor .     Lines / Drains: None  Cultures: Blood PCT Lactate UA   Antibiotics: Vanc Ceftriaxone Azithro   Tests / Events: Cards consult 11/07/2011   OVERNIGHT/SUBJECTIVE/INTERVAL HX  - still with fatigue and dyspnea going to bathroom  - expressing he is tired of everything and ready to die in peace and has lived good life; prefers to be DNAR/DNAI but does not want daugther to know  - son in law wants who arrived later and not privy to code discussion: wants testosterone level checked  Vital Signs: Pulse Rate:  [49] 49  (02/21 1122) BP: (98)/(64) 98/64 mmHg (02/21 1122) SpO2:  [96 %] 96 % (02/21 1122) Weight:  [73.483 kg (162 lb)] 73.483 kg (162 lb) (02/21 1122)    Physical Examination:  There were no vitals taken for this visit.  General Appearance:   Very deconditioned and chronic unwell looking compared to August 2012. Sitting on wheel chair. Looks fatigues and worn out and lost weight   Head:    Normocephalic, without obvious abnormality, atraumatic  Eyes:    PERRL, conjunctiva/corneas clear, EOM's intact, fundi    benign, both eyes       Ears:    Normal TM's and external ear canals, both ears     Throat:   Lips, mucosa, and tongue normal; teeth and gums normal  Neck:   Supple, symmetrical, trachea midline, no adenopathy;       thyroid:  No enlargement/tenderness/nodules; no carotid    bruit or JVD  Back:     Symmetric, no curvature, ROM normal, no CVA tenderness  Lungs:     Clear to auscultation bilaterally, respirations unlabored  Chest wall:  Barrell chest  Heart:    Regular rate and rhythm, S1 and S2 normal, no murmur, rub   or gallop  Abdomen:     Soft, non-tender, bowel sounds active all four quadrants,    no masses, no organomegaly  Genitalia:  Not examined  Rectal:   Not examined  Extremities:   Extremities normal, atraumatic, no cyanosis or edema  Pulses:   2+ and symmetric all extremities  Skin:   Skin color, texture, turgor normal, no rashes or lesions  Lymph nodes:   Cervical, supraclavicular, and axillary nodes normal  Neurologic:   CNII-XII intact. Normal strength, sensation and reflexes      throughout    LABS    Lab 11/08/11 0430 11/07/11 1715  NA 133* 135  K 3.4* 4.0  CL 93* 94*  CO2 30 31  GLUCOSE 183* 176*  BUN 37* 36*  CREATININE 1.33 1.39*  CALCIUM 8.7 9.4  MG 2.0 2.0  PHOS 4.4 4.0    Lab 11/08/11 0430 11/07/11 1720  PROBNP 1747.0* 2734.0*    Lab 11/08/11 0430 11/07/11 1715  INR 7.02* 6.32*    Lab 11/08/11 0430 11/07/11 1715 11/04/11 1300  HGB 15.2 16.5 16.6  HCT 43.6 46.1 49.2  WBC 10.1 11.0* 9.1  PLT 177 197 176.0     Lab 11/08/11 0905 11/08/11 0043 11/07/11 1720  TROPONINI <0.30 <0.30 <0.30    Lab 11/07/11 1650  PHART 7.491*  PCO2ART 33.6*  PO2ART 70.0*  HCO3 25.5*  TCO2 21.2  O2SAT 95.4      Imaging results:  Portable Chest Xray  11/07/2011  *RADIOLOGY REPORT*  Clinical Data: 77 year old male with shortness of breath and cough.  PORTABLE CHEST - 1 VIEW  Comparison: 11/04/2011 and earlier.  Findings: Large lung volumes.  Stable left chest cardiac pacemaker. Chronic blunting of the right costophrenic angle.  No pneumothorax, pulmonary edema or acute pleural effusion.  Cardiac size and mediastinal contours are within normal limits.  No acute pulmonary opacity.  IMPRESSION: No acute cardiopulmonary abnormality.   Original Report Authenticated By: Harley Hallmark, M.D.     Assessment & Plan  Patient Active Hospital Problem List: COPD exacerbation (11/07/2011)   COPD, severe (12/26/2010)   Assessment: SPirometry shows worsening to gold stage 4 on 11/07/11. However, I feel this is progressive lung disease and not aecopd. Though tough to rule out AECIPD    Plan: await alpha 1 from 11/07/11, Rx for AECOPD with abx, steroids and nebs  Failure to Thrive (11/07/2011)  Assesmsent: New since past few weeks. Unclear why. Suspect is combo of gold stage 4  copd and systolic chf  PLan: Rx others and monitor. Get PT consult, Get nutrition consult. Check testosterone level Atrial fibrillation (12/26/2010)   Assessment: He says pacer not working well   Plan: call cards consult, check ekg CHF (congestive heart failure) (12/26/2010) - ef 55% in 01-03-12but had stent due to mI in nov 2012   Assessment: appears euvolemic. EF 11/08/2011 - 45%   Plan:  Per cards,. Ok to increase bisoprolol or change to metoprolol (beta 1 sepcific) Pacemaker (04/02/2011)   Assessment: Dtr feels pacer not working well   Plan: per cards CAD (coronary artery disease) (08/15/2011)   Assessment: clinically no chest pain. MI ruled out   Plan: monitor Diabetes mellitus (12/26/2010)   Assessment: appears stable   Plan: ssi protocol Abdominal aortic aneurysm (12/26/2010)   Assessment: not active, do not know details   Plan: monitor  COagulopathy (01/05/12):  -hold coumadin Lab Results  Component Value Date   INR 7.02* 11/08/2011   INR 6.32* 11/07/2011   INR 2.1 10/18/2011   SPIRITUAL DISTRESS  - Dr Marchelle Gearing d/w patient2/22/13: he says he is tired of health issues.He misses his wife dearly. Was married for 51 years and she died 09/18/00.Says he is ready to join her.Not depressed. No suicidal thoughts. No fear of dying. Says he is ready to move on. At outset he himself said he does not want CPR. Does not want this conversation to be mentioned to his  dtr [name]. He has son in [place] aged 10s but he has TBI from being in [place].  Will change code status to limited code blue. Will continue conversations. Will approach hospice topic 11/09/11

## 2011-11-08 NOTE — Progress Notes (Signed)
CRITICAL VALUE ALERT  Critical value received:  INR 7.02  Date of notification:  2/22  Time of notification: 0635  Critical value read back:yes  Nurse who received alert:  Casilda Carls RN  MD notified (1st page): Ava  Time of first page:  [phone]  MD notified (2nd page): n/a  Time of second page:n/a   Responding MD:  Ava  Time MD responded: [phone]

## 2011-11-08 NOTE — Progress Notes (Signed)
Chart reviewed.

## 2011-11-09 DIAGNOSIS — R627 Adult failure to thrive: Secondary | ICD-10-CM

## 2011-11-09 DIAGNOSIS — J441 Chronic obstructive pulmonary disease with (acute) exacerbation: Secondary | ICD-10-CM

## 2011-11-09 LAB — BASIC METABOLIC PANEL
Calcium: 9.2 mg/dL (ref 8.4–10.5)
Creatinine, Ser: 1.11 mg/dL (ref 0.50–1.35)
GFR calc Af Amer: 70 mL/min — ABNORMAL LOW (ref >90)
GFR calc non Af Amer: 60 mL/min — ABNORMAL LOW (ref >90)
Sodium: 132 mEq/L — ABNORMAL LOW (ref 135–145)

## 2011-11-09 LAB — CBC
MCH: 31.1 pg (ref 26.0–34.0)
MCHC: 35.3 g/dL (ref 30.0–36.0)
MCV: 88.3 fL (ref 78.0–100.0)
Platelets: 212 10*3/uL (ref 150–400)
RBC: 5.11 MIL/uL (ref 4.22–5.81)
RDW: 13 % (ref 11.5–15.5)

## 2011-11-09 LAB — PROTIME-INR
INR: 4.98 — ABNORMAL HIGH (ref 0.00–1.49)
Prothrombin Time: 47 seconds — ABNORMAL HIGH (ref 11.6–15.2)

## 2011-11-09 LAB — VANCOMYCIN, TROUGH: Vancomycin Tr: 8.3 ug/mL — ABNORMAL LOW (ref 10.0–20.0)

## 2011-11-09 LAB — DIGOXIN LEVEL: Digoxin Level: 1.7 ng/mL (ref 0.8–2.0)

## 2011-11-09 MED ORDER — VANCOMYCIN HCL 1000 MG IV SOLR
1250.0000 mg | Freq: Two times a day (BID) | INTRAVENOUS | Status: DC
  Administered 2011-11-09 – 2011-11-10 (×2): 1250 mg via INTRAVENOUS
  Filled 2011-11-09 (×4): qty 1250

## 2011-11-09 MED ORDER — METHYLPREDNISOLONE SODIUM SUCC 40 MG IJ SOLR
40.0000 mg | Freq: Three times a day (TID) | INTRAMUSCULAR | Status: DC
  Administered 2011-11-09 – 2011-11-10 (×2): 40 mg via INTRAVENOUS
  Filled 2011-11-09 (×5): qty 1

## 2011-11-09 NOTE — Progress Notes (Signed)
Subjective: Stable overnight.  Diuresing well, no increased wob.   Objective: Vital signs in last 24 hours: Blood pressure 119/71, pulse 82, temperature 97.8 F (36.6 C), temperature source Oral, resp. rate 19, height 5\' 9"  (1.753 m), weight 73 kg (160 lb 15 oz), SpO2 92.00%.  Intake/Output from previous day: 02/22 0701 - 02/23 0700 In: 1080 [P.O.:240; I.V.:240; IV Piggyback:600] Out: 3400 [Urine:3400]   Physical Exam:   wd male in nad Chest with very decreased bs, no wheezing Cor with distant heart sounds, ?regular today abd benign Alert and oriented, moves all 4.    Lab Results:  Basename 11/09/11 0832 11/08/11 0430 11/07/11 1715  WBC 18.0* 10.1 11.0*  HGB 15.9 15.2 16.5  HCT 45.1 43.6 46.1  PLT 212 177 197   BMET  Basename 11/09/11 0832 11/08/11 0430 11/07/11 1715  NA 132* 133* 135  K 3.6 3.4* 4.0  CL 94* 93* 94*  CO2 25 30 31   GLUCOSE 301* 183* 176*  BUN 33* 37* 36*  CREATININE 1.11 1.33 1.39*  CALCIUM 9.2 8.7 9.4    Studies/Results: Portable Chest Xray  11/07/2011  *RADIOLOGY REPORT*  Clinical Data: 77 year old male with shortness of breath and cough.  PORTABLE CHEST - 1 VIEW  Comparison: 11/04/2011 and earlier.  Findings: Large lung volumes.  Stable left chest cardiac pacemaker. Chronic blunting of the right costophrenic angle.  No pneumothorax, pulmonary edema or acute pleural effusion.  Cardiac size and mediastinal contours are within normal limits.  No acute pulmonary opacity.  IMPRESSION: No acute cardiopulmonary abnormality.  Original Report Authenticated By: Harley Hallmark, M.D.    Assessment/Plan: Patient Active Hospital Problem List:  COPD exacerbation (11/07/2011) Pt is improved with steroids, abx, and BD.  Sats are adequate and he has no increased wob.  Will decrease steroids since his BS are spiking.     CHF (congestive heart failure) (12/26/2010) Diuresing well, and being followed by cardiology    Diabetes mellitus (12/26/2010) Blood sugars  elevated due to high dose steroids.  Will decrease, and see how cbg's do.        Barbaraann Share, M.D. 11/09/2011, 2:19 PM

## 2011-11-09 NOTE — Progress Notes (Signed)
SUBJECTIVE:  SOB this am after eating breakfast  OBJECTIVE:   Vitals:   Filed Vitals:   11/09/11 0421 11/09/11 0500 11/09/11 0535 11/09/11 0810  BP:   111/56   Pulse:   72   Temp:   97.5 F (36.4 C)   TempSrc:   Oral   Resp:   18   Height:      Weight:  73 kg (160 lb 15 oz)    SpO2: 91%  92% 92%   I&O's:   Intake/Output Summary (Last 24 hours) at 11/09/11 1610 Last data filed at 11/09/11 0700  Gross per 24 hour  Intake   1080 ml  Output   3400 ml  Net  -2320 ml   TELEMETRY: Reviewed telemetry pt in atrial fibrillation with occasional V pacing and PVC's     PHYSICAL EXAM General: Well developed, well nourished, in no acute distress Head: Eyes PERRLA, No xanthomas.   Normal cephalic and atramatic  Lungs:   Decreased BS at left base Heart:   HRRR S1 S2 Pulses are 2+ & equal. Abdomen: Bowel sounds are positive, abdomen soft and non-tender without masses  Extremities:   No clubbing, cyanosis or edema.  DP +1 Neuro: Alert and oriented X 3. Psych:  Good affect, responds appropriately   LABS: Basic Metabolic Panel:  Basename 11/08/11 0430 11/07/11 1715  NA 133* 135  K 3.4* 4.0  CL 93* 94*  CO2 30 31  GLUCOSE 183* 176*  BUN 37* 36*  CREATININE 1.33 1.39*  CALCIUM 8.7 9.4  MG 2.0 2.0  PHOS 4.4 4.0   Liver Function Tests:  Basename 11/07/11 1715  AST 20  ALT 26  ALKPHOS 65  BILITOT 0.7  PROT 6.4  ALBUMIN 3.9    Basename 11/07/11 1715  LIPASE 20  AMYLASE 39   CBC:  Basename 11/08/11 0430 11/07/11 1715  WBC 10.1 11.0*  NEUTROABS -- 9.4*  HGB 15.2 16.5  HCT 43.6 46.1  MCV 88.6 89.3  PLT 177 197   Cardiac Enzymes:  Basename 11/08/11 0905 11/08/11 0043 11/07/11 1720  CKTOTAL 25 27 32  CKMB 3.3 3.1 3.6  CKMBINDEX -- -- --  TROPONINI <0.30 <0.30 <0.30   D-Dimer:  [facility] 11/07/11 1715  DDIMER <0.22   Coag Panel:   Lab Results  Component Value Date   INR 7.02* 11/08/2011   INR 6.32* 11/07/2011   INR 2.1 10/18/2011    RADIOLOGY: Dg Chest 2  View  11/04/2011  *RADIOLOGY REPORT*  Clinical Data: Shortness of breath and cough.  Ex smoker.  CHEST - 2 VIEW  Comparison: Chest x-ray 08/13/2011.  Findings: Lungs appear hyperexpanded with flattening of the hemidiaphragms, increased retrosternal air space and pruning of the pulmonary vasculature in the periphery, suggestive of underlying COPD.  No airspace consolidation.  No definite pleural effusions. Blunting of the right costophrenic sulcus is similar compared to priors, most consistent with scarring.  No evidence of edema. Heart size is normal.  Mediastinal contours are unremarkable. Atherosclerosis in the thoracic aorta.  Single lead pacemaker device in place with lead tip projecting over the expected location of the right ventricular apex.  IMPRESSION: 1.  Changes of COPD redemonstrated, as above. 2.  Atherosclerosis.  Original Report Authenticated By: Florencia Reasons, M.D.   Portable Chest Xray  11/07/2011  *RADIOLOGY REPORT*  Clinical Data: 78 year old male with shortness of breath and cough.  PORTABLE CHEST - 1 VIEW  Comparison: 11/04/2011 and earlier.  Findings: Large lung volumes.  Stable left  chest cardiac pacemaker. Chronic blunting of the right costophrenic angle.  No pneumothorax, pulmonary edema or acute pleural effusion.  Cardiac size and mediastinal contours are within normal limits.  No acute pulmonary opacity.  IMPRESSION: No acute cardiopulmonary abnormality.  Original Report Authenticated By: Harley Hallmark, M.D.      ASSESSMENT:  1.  Chronic atrial fibrillation with V pacing - no further VT 2.  COPD exacerbation 3.  Acute on chronic systolic heart failure 4,  CAD   PLAN:   1.  Check BNP 2.  Continue bisprolol//Lasix IV 3.  Continue ASA/Plavix/Imdur/statin  Quintella Reichert, MD  11/09/2011  8:39 AM

## 2011-11-09 NOTE — Progress Notes (Signed)
ANTICOAGULATION CONSULT NOTE - Follow Up Consult  Pharmacy Consult for warfarin Indication: hx Afib, s/p St Jude single chamber PM insertion  Allergies  Allergen Reactions  . Morphine Itching    Patient Measurements: Height: 5\' 9"  (175.3 cm) Weight: 160 lb 15 oz (73 kg) IBW/kg (Calculated) : 70.7   Vital Signs: Temp: 97.5 F (36.4 C) (02/23 0535) Temp src: Oral (02/23 0535) BP: 111/56 mmHg (02/23 0535) Pulse Rate: 88  (02/23 0917)  Labs:  [name] 11/09/11 5409 11/08/11 0905 11/08/11 0430 11/08/11 0043 11/07/11 1720 11/07/11 1715  HGB 15.9 -- 15.2 -- -- --  HCT 45.1 -- 43.6 -- -- 46.1  PLT 212 -- 177 -- -- 197  APTT -- -- -- -- -- 54*  LABPROT 47.0* -- 61.5* -- -- 56.6*  INR 4.98* -- 7.02* -- -- 6.32*  HEPARINUNFRC -- -- -- -- -- --  CREATININE 1.11 -- 1.33 -- -- 1.39*  CKTOTAL -- 25 -- 27 32 --  CKMB -- 3.3 -- 3.1 3.6 --  TROPONINI -- <0.30 -- <0.30 <0.30 --   Estimated Creatinine Clearance: 52.2 ml/min (by C-G formula based on Cr of 1.11).   Medications:  Scheduled:     . azithromycin  500 mg Intravenous Q24H  . bisoprolol  5 mg Oral Daily  . budesonide  0.25 mg Nebulization BID  . cefTRIAXone (ROCEPHIN)  IV  1 g Intravenous Q24H  . cholecalciferol  1,000 Units Oral Daily  . clopidogrel  75 mg Oral Q breakfast  . digoxin  250 mcg Oral Daily  . diltiazem  240 mg Oral Daily  . furosemide  40 mg Intravenous BID  . ipratropium  500 mcg Nebulization QID  . isosorbide mononitrate  60 mg Oral Daily  . levalbuterol  0.63 mg Nebulization Q4H  . methylPREDNISolone (SOLU-MEDROL) injection  60 mg Intravenous Q6H  . pantoprazole  20 mg Oral Q1200  . potassium chloride  20 mEq Oral BID  . rosuvastatin  40 mg Oral Daily  . senna-docusate  2 tablet Oral QHS  . vancomycin  1,250 mg Intravenous Q12H  . DISCONTD: vancomycin  750 mg Intravenous Q12H    Assessment:  83 YOM on chronic coumadin for hx Afib/ s/p St Jude single chamber PM insertion  Home dose of warfarin  was 5mg  daily PTA, last dose 2/20 PTA  INR is still supratx but decreasing  CBC stable, No reported bleeding  Goal of Therapy:  INR 2-3   Plan:   Continue to hold warfarin  Monitor closely for signs and symptoms of bleeding  Reversal per Md if necessary  Gwen Her PharmD  [phone] 11/09/2011 10:59 AM

## 2011-11-09 NOTE — Progress Notes (Signed)
Patient had a 24 beat run of VT. Patient was asymptomatic lying in bed watching the television when this occurred.  VS stable. Patient says "I feel fine. My breathing is better."  Dr. Mayford Knife was paged and made aware and after reviewing medications and lab values, we will continue to monitor at this time.  Patient has been having small runs of PVCs and MD is aware.  Will page Dr. Mayford Knife again if patient has another long run of VT.  Will continue to monitor patient.  [name], Joslyn Devon

## 2011-11-09 NOTE — Progress Notes (Signed)
ANTIBIOTIC CONSULT NOTE - FOLLOW UP  Pharmacy Consult for Vancomycin/Azithromycin/Rocephin Indication: R/O Sepsis  Allergies  Allergen Reactions  . Morphine Itching    Patient Measurements: Height: 5\' 9"  (175.3 cm) Weight: 160 lb 15 oz (73 kg) IBW/kg (Calculated) : 70.7   Vital Signs: Temp: 97.5 F (36.4 C) (02/23 0535) Temp src: Oral (02/23 0535) BP: 111/56 mmHg (02/23 0535) Pulse Rate: 88  (02/23 0917) Intake/Output from previous day: 02/22 0701 - 02/23 0700 In: 1080 [P.O.:240; I.V.:240; IV Piggyback:600] Out: 3400 [Urine:3400] Intake/Output from this shift: Total I/O In: 240 [P.O.:240] Out: 700 [Urine:700]  Labs:  [facility] 11/09/11 0832 11/08/11 0430 11/07/11 1715  WBC 18.0* 10.1 11.0*  HGB 15.9 15.2 16.5  PLT 212 177 197  LABCREA -- -- --  CREATININE 1.11 1.33 1.39*   Estimated Creatinine Clearance: 52.2 ml/min (by C-G formula based on Cr of 1.11). CrCl 53 ml/min/1.62m2  Basename 11/09/11 0832  VANCOTROUGH 8.3*  VANCOPEAK --  VANCORANDOM --  GENTTROUGH --  GENTPEAK --  GENTRANDOM --  TOBRATROUGH --  TOBRAPEAK --  TOBRARND --  AMIKACINPEAK --  AMIKACINTROU --  AMIKACIN --     Microbiology: Recent Results (from the past 720 hour(s))  CULTURE, BLOOD (ROUTINE X 2)     Status: Normal (Preliminary result)   Collection Time   11/07/11  5:15 PM      Component Value Range Status Comment   Specimen Description BLOOD RIGHT ARM   Final    Special Requests BOTTLES DRAWN AEROBIC AND ANAEROBIC 10CC   Final    Culture  Setup Time [id number]   Final    Culture     Final    Value:        BLOOD CULTURE RECEIVED NO GROWTH TO DATE CULTURE WILL BE HELD FOR 5 DAYS BEFORE ISSUING A FINAL NEGATIVE REPORT   Report Status PENDING   Incomplete   CULTURE, BLOOD (ROUTINE X 2)     Status: Normal (Preliminary result)   Collection Time   11/07/11  5:20 PM      Component Value Range Status Comment   Specimen Description BLOOD LEFT ARM   Final    Special Requests BOTTLES  DRAWN AEROBIC AND ANAEROBIC 10CC   Final    Culture  Setup Time [id number]   Final    Culture     Final    Value:        BLOOD CULTURE RECEIVED NO GROWTH TO DATE CULTURE WILL BE HELD FOR 5 DAYS BEFORE ISSUING A FINAL NEGATIVE REPORT   Report Status PENDING   Incomplete     Anti-infectives     Start     Dose/Rate Route Frequency Ordered Stop   11/07/11 2000   vancomycin (VANCOCIN) 750 mg in sodium chloride 0.9 % 150 mL IVPB        750 mg 150 mL/hr over 60 Minutes Intravenous Every 12 hours 11/07/11 1924     11/07/11 1800   cefTRIAXone (ROCEPHIN) 1 g in dextrose 5 % 50 mL IVPB        1 g 100 mL/hr over 30 Minutes Intravenous Every 24 hours 11/07/11 1636     11/07/11 1800   azithromycin (ZITHROMAX) 500 mg in dextrose 5 % 250 mL IVPB        500 mg 250 mL/hr over 60 Minutes Intravenous Every 24 hours 11/07/11 1636     11/07/11 1645   vancomycin (VANCOCIN) IVPB 1000 mg/200 mL premix  Status:  Discontinued  1,000 mg 200 mL/hr over 60 Minutes Intravenous STAT 11/07/11 1635 11/07/11 1924          Assessment:  83 YOM admitted 2/21 with COPD exacerbation   Day #3 Vancomycin 750mg  IV q12h  Day #3 Azith/Rocephin  Serum creatinine is improved  Vancomycin trough today is subtherapeutic  Goal of Therapy:  Vancomycin trough level 15-20 mcg/ml  Plan:   Increase vancomycin to 1250mg  IV q12h  No change to other abx (no need for renal adjustment of rocephin and azithromycin)  Recheck vanc trough if necessary  Length of therapy/narrow abx spectrum per MD  Gwen Her PharmD  [phone] 11/09/2011 10:53 AM

## 2011-11-10 LAB — PRO B NATRIURETIC PEPTIDE: Pro B Natriuretic peptide (BNP): 2737 pg/mL — ABNORMAL HIGH (ref 0–450)

## 2011-11-10 LAB — BASIC METABOLIC PANEL
BUN: 34 mg/dL — ABNORMAL HIGH (ref 6–23)
Creatinine, Ser: 1.06 mg/dL (ref 0.50–1.35)
GFR calc non Af Amer: 64 mL/min — ABNORMAL LOW (ref >90)
Glucose, Bld: 292 mg/dL — ABNORMAL HIGH (ref 70–99)
Potassium: 3.6 mEq/L (ref 3.5–5.1)

## 2011-11-10 MED ORDER — PREDNISONE 20 MG PO TABS
40.0000 mg | ORAL_TABLET | Freq: Every day | ORAL | Status: DC
  Administered 2011-11-10 – 2011-11-12 (×3): 40 mg via ORAL
  Filled 2011-11-10 (×5): qty 2

## 2011-11-10 MED ORDER — AZITHROMYCIN 500 MG PO TABS
500.0000 mg | ORAL_TABLET | ORAL | Status: DC
  Filled 2011-11-10: qty 1

## 2011-11-10 MED ORDER — POTASSIUM CHLORIDE CRYS ER 20 MEQ PO TBCR
20.0000 meq | EXTENDED_RELEASE_TABLET | Freq: Three times a day (TID) | ORAL | Status: DC
  Administered 2011-11-10 – 2011-11-15 (×10): 20 meq via ORAL
  Filled 2011-11-10 (×21): qty 1

## 2011-11-10 MED ORDER — LEVOFLOXACIN 750 MG PO TABS
750.0000 mg | ORAL_TABLET | Freq: Every day | ORAL | Status: DC
  Administered 2011-11-10 – 2011-11-12 (×3): 750 mg via ORAL
  Filled 2011-11-10 (×5): qty 1

## 2011-11-10 MED ORDER — ENSURE IMMUNE HEALTH PO LIQD
237.0000 mL | Freq: Two times a day (BID) | ORAL | Status: DC
  Administered 2011-11-10 – 2011-11-12 (×3): 237 mL via ORAL

## 2011-11-10 NOTE — Progress Notes (Signed)
PHARMACIST - PHYSICIAN COMMUNICATION DR:   Shelle Iron CONCERNING: Antibiotic IV to Oral Route Change Policy  RECOMMENDATION: This patient is receiving Azithromycin by the intravenous route.  Based on criteria approved by the Pharmacy and Therapeutics Committee, the antibiotic(s) is/are being converted to the equivalent oral dose form(s).   DESCRIPTION: These criteria include:  Patient being treated for a respiratory tract infection, urinary tract infection, or cellulitis  The patient is not neutropenic and does not exhibit a GI malabsorption state  The patient is eating (either orally or via tube) and/or has been taking other orally administered medications for a least 24 hours  The patient is improving clinically and has a Tmax < 100.5  If you have questions about this conversion, please contact the Pharmacy Department  []   [phone] )  [facility] []   [phone] )  [facility]  []   [phone] )  [facility] []   [phone] )  [facility]

## 2011-11-10 NOTE — Progress Notes (Signed)
SUBJECTIVE:    OBJECTIVE:   Vitals:   Filed Vitals:   11/10/11 0027 11/10/11 0404 11/10/11 0546 11/10/11 0827  BP:   127/61   Pulse:   73   Temp:   97.5 F (36.4 C)   TempSrc:   Oral   Resp:   18   Height:      Weight:   71.85 kg (158 lb 6.4 oz)   SpO2: 94% 95% 94% 93%   I&O's:   Intake/Output Summary (Last 24 hours) at 11/10/11 0830 Last data filed at 11/10/11 0551  Gross per 24 hour  Intake   2240 ml  Output   2350 ml  Net   -110 ml   TELEMETRY: Reviewed telemetry pt in atrial fibrillation with 24 beats of WCT last PM - asymptomatic      PHYSICAL EXAM General: Well developed, well nourished, in no acute distress Head: Eyes PERRLA, No xanthomas.   Normal cephalic and atramatic  Lungs:   Clear bilaterally to auscultation and percussion. Heart:   HRRR S1 S2 Pulses are 2+ & equal. Abdomen: Bowel sounds are positive, abdomen soft and non-tender without masses Extremities:   No clubbing, cyanosis or edema.  DP +1 Neuro: Alert and oriented X 3. Psych:  Good affect, responds appropriately   LABS: Basic Metabolic Panel:  Basename 11/10/11 0425 11/09/11 0832 11/08/11 0430 11/07/11 1715  NA 133* 132* -- --  K 3.6 3.6 -- --  CL 95* 94* -- --  CO2 27 25 -- --  GLUCOSE 292* 301* -- --  BUN 34* 33* -- --  CREATININE 1.06 1.11 -- --  CALCIUM 8.8 9.2 -- --  MG -- -- 2.0 2.0  PHOS -- -- 4.4 4.0   Liver Function Tests:  Basename 11/07/11 1715  AST 20  ALT 26  ALKPHOS 65  BILITOT 0.7  PROT 6.4  ALBUMIN 3.9    Basename 11/07/11 1715  LIPASE 20  AMYLASE 39   CBC:  Basename 11/09/11 0832 11/08/11 0430 11/07/11 1715  WBC 18.0* 10.1 --  NEUTROABS -- -- 9.4*  HGB 15.9 15.2 --  HCT 45.1 43.6 --  MCV 88.3 88.6 --  PLT 212 177 --   Cardiac Enzymes:  Basename 11/08/11 0905 11/08/11 0043 11/07/11 1720  CKTOTAL 25 27 32  CKMB 3.3 3.1 3.6  CKMBINDEX -- -- --  TROPONINI <0.30 <0.30 <0.30   BNP: No components found with this basename:  POCBNP:3 D-Dimer:  [facility] 11/07/11 1715  DDIMER <0.22   Coag Panel:   Lab Results  Component Value Date   INR 4.62* 11/10/2011   INR 4.98* 11/09/2011   INR 7.02* 11/08/2011    RADIOLOGY: Dg Chest 2 View  11/04/2011  *RADIOLOGY REPORT*  Clinical Data: Shortness of breath and cough.  Ex smoker.  CHEST - 2 VIEW  Comparison: Chest x-ray 08/13/2011.  Findings: Lungs appear hyperexpanded with flattening of the hemidiaphragms, increased retrosternal air space and pruning of the pulmonary vasculature in the periphery, suggestive of underlying COPD.  No airspace consolidation.  No definite pleural effusions. Blunting of the right costophrenic sulcus is similar compared to priors, most consistent with scarring.  No evidence of edema. Heart size is normal.  Mediastinal contours are unremarkable. Atherosclerosis in the thoracic aorta.  Single lead pacemaker device in place with lead tip projecting over the expected location of the right ventricular apex.  IMPRESSION: 1.  Changes of COPD redemonstrated, as above. 2.  Atherosclerosis.  Original Report Authenticated By: Florencia Reasons, M.D.  Portable Chest Xray  11/07/2011  *RADIOLOGY REPORT*  Clinical Data: 78 year old male with shortness of breath and cough.  PORTABLE CHEST - 1 VIEW  Comparison: 11/04/2011 and earlier.  Findings: Large lung volumes.  Stable left chest cardiac pacemaker. Chronic blunting of the right costophrenic angle.  No pneumothorax, pulmonary edema or acute pleural effusion.  Cardiac size and mediastinal contours are within normal limits.  No acute pulmonary opacity.  IMPRESSION: No acute cardiopulmonary abnormality.  Original Report Authenticated By: Harley Hallmark, M.D.      ASSESSMENT:  1. Chronic atrial fibrillation with V pacing - had a 24 beat run of WCT that was very regular but was proceeded with a V paced beat and all complexed in the run looked similar to the beat that was paced.   2. COPD exacerbation  3. Acute on  chronic systolic heart failure symptomatically better but pro-BNP trending upward 4, CAD  5. Borderline hypokalemia 6. Suprtherapeutic INR   PLAN:   1.  Pacer interrogation by rep 2.  Continue bisprolol and increase Lasix to 80mg  BID since he his I&O's yesterday were essentially matched and BNP trending upward 3.  Continue ASA/Plavix/Imdur and statin 4.  Replete potassium to keep > 4 - increase Kdur to TID 5.  Hold coumadin today  Quintella Reichert, MD  11/10/2011  8:30 AM

## 2011-11-10 NOTE — Progress Notes (Signed)
Nutrition Follow-up/New Consult  Diet Order:  Carbohydrate modified, 66-75% meal completion  Patient reports his appetite is fair, but he is making himself eat. He c/o dry mouth, which limits his intake.   Meds: Scheduled Meds:   . azithromycin  500 mg Oral Q24H  . bisoprolol  5 mg Oral Daily  . budesonide  0.25 mg Nebulization BID  . cefTRIAXone (ROCEPHIN)  IV  1 g Intravenous Q24H  . cholecalciferol  1,000 Units Oral Daily  . clopidogrel  75 mg Oral Q breakfast  . digoxin  250 mcg Oral Daily  . diltiazem  240 mg Oral Daily  . furosemide  40 mg Intravenous BID  . ipratropium  500 mcg Nebulization QID  . isosorbide mononitrate  60 mg Oral Daily  . levalbuterol  0.63 mg Nebulization Q4H  . methylPREDNISolone (SOLU-MEDROL) injection  40 mg Intravenous Q8H  . pantoprazole  20 mg Oral Q1200  . potassium chloride  20 mEq Oral TID  . rosuvastatin  40 mg Oral Daily  . senna-docusate  2 tablet Oral QHS  . vancomycin  1,250 mg Intravenous Q12H  . DISCONTD: azithromycin  500 mg Intravenous Q24H  . DISCONTD: methylPREDNISolone (SOLU-MEDROL) injection  60 mg Intravenous Q6H  . DISCONTD: potassium chloride  20 mEq Oral BID   Continuous Infusions:  PRN Meds:.sodium chloride, levalbuterol, levalbuterol, nitroGLYCERIN, oxyCODONE  Labs:  CMP     Component Value Date/Time   NA 133* 11/10/2011 0425   K 3.6 11/10/2011 0425   CL 95* 11/10/2011 0425   CO2 27 11/10/2011 0425   GLUCOSE 292* 11/10/2011 0425   BUN 34* 11/10/2011 0425   CREATININE 1.06 11/10/2011 0425   CALCIUM 8.8 11/10/2011 0425   PROT 6.4 11/07/2011 1715   ALBUMIN 3.9 11/07/2011 1715   AST 20 11/07/2011 1715   ALT 26 11/07/2011 1715   ALKPHOS 65 11/07/2011 1715   BILITOT 0.7 11/07/2011 1715   GFRNONAA 64* 11/10/2011 0425   GFRAA 74* 11/10/2011 0425     Intake/Output Summary (Last 24 hours) at 11/10/11 1302 Last data filed at 11/10/11 0900  Gross per 24 hour  Intake   2480 ml  Output   1650 ml  Net    830 ml    Weight Status:   71.8 kg, which is stable  Re-estimated needs:  No changes in estimated needs  Nutrition Dx:  Unintentional weight loss, ongoing, but improved with weight stablization.  Goal:  Patient to consume >75% of meals, improved  Intervention:   1. Patient requests nutrition supplements, will order Ensure Immune BID between meals.  2. Discussed with patient softer foods to choose to help with dry mouth and recommended ordering gravy with meals.  3. Patient may benefit from meds to help with dry mouth.   Monitor:  PO intake, weight, labs   Dezerae Freiberger, [employer]

## 2011-11-10 NOTE — Progress Notes (Signed)
Subjective: Stable overnight, no increased wob.  Pt does not want to give up foley.  Wants to keep one more day.  Denies chest congestion or mucus production.   Objective: Vital signs in last 24 hours: Blood pressure 127/61, pulse 73, temperature 97.5 F (36.4 C), temperature source Oral, resp. rate 18, height 5\' 9"  (1.753 m), weight 71.85 kg (158 lb 6.4 oz), SpO2 95.00%.  Intake/Output from previous day: 02/23 0701 - 02/24 0700 In: 2240 [P.O.:1510; I.V.:280; IV Piggyback:450] Out: 2350 [Urine:2350]   Physical Exam:   wd male in nad Chest with very decreased bs, no wheezing Cor with distant heart sounds, ?regular today abd benign Alert and oriented, moves all 4.    Lab Results:  Basename 11/09/11 0832 11/08/11 0430 11/07/11 1715  WBC 18.0* 10.1 11.0*  HGB 15.9 15.2 16.5  HCT 45.1 43.6 46.1  PLT 212 177 197   BMET  Basename 11/10/11 0425 11/09/11 0832 11/08/11 0430  NA 133* 132* 133*  K 3.6 3.6 3.4*  CL 95* 94* 93*  CO2 27 25 30   GLUCOSE 292* 301* 183*  BUN 34* 33* 37*  CREATININE 1.06 1.11 1.33  CALCIUM 8.8 9.2 8.7    Studies/Results: No results found.  Assessment/Plan: Patient Active Hospital Problem List:  COPD exacerbation (11/07/2011) Pt is improved with steroids, abx, and BD.  Sats are adequate and he has no increased wob.  Will change steroids to po prednisone, and also change abx to oral route.    CHF (congestive heart failure) (12/26/2010) Diuresing well, and being followed by cardiology    Diabetes mellitus (12/26/2010) Blood sugars elevated due to high dose steroids.  Will change to po prednisone and follow for next 12-24hrs.  If no better, would start ssi.        Barbaraann Share, M.D. 11/10/2011, 1:45 PM

## 2011-11-10 NOTE — Progress Notes (Addendum)
Attempted to speak with Pt.  Pt resting.  Spoke with daughter via phone re: Pt's current admission.  Daughter states that the family feels as though Pt is needing more care, as he lives alone.  Family visits with Pt almost daily and provides him with food, as family feels that Pt isn't caring for himself as best he should.  Family has broached the subject of ALF, however Pt not amenable; he's concerned that he won't be able to drive and that he'll lose his independence.  Family made aware that Remonia Richter, CSW, to follow Pt.  Family appreciates CSW's call and looks forward to assistance from CSW.  Yellow Ax note on chart.  CSW to continue to follow.  Providence Crosby, LCSWA Clinical Social Work [phone] Weekend coverage

## 2011-11-10 NOTE — Progress Notes (Signed)
ANTICOAGULATION CONSULT NOTE - Follow Up Consult  Pharmacy Consult for warfarin Indication: hx Afib, s/p St Jude single chamber PM insertion  Allergies  Allergen Reactions  . Morphine Itching    Patient Measurements: Height: 5\' 9"  (175.3 cm) Weight: 158 lb 6.4 oz (71.85 kg) IBW/kg (Calculated) : 70.7   Vital Signs: Temp: 97.5 F (36.4 C) (02/24 0546) Temp src: Oral (02/24 0546) BP: 127/61 mmHg (02/24 0546) Pulse Rate: 73  (02/24 0546)  Labs:  Alvira Philips 11/10/11 0425 11/09/11 1610 11/08/11 0905 11/08/11 0430 11/08/11 0043 11/07/11 1720 11/07/11 1715  HGB -- 15.9 -- 15.2 -- -- --  HCT -- 45.1 -- 43.6 -- -- 46.1  PLT -- 212 -- 177 -- -- 197  APTT -- -- -- -- -- -- 54*  LABPROT 44.3* 47.0* -- 61.5* -- -- --  INR 4.62* 4.98* -- 7.02* -- -- --  HEPARINUNFRC -- -- -- -- -- -- --  CREATININE 1.06 1.11 -- 1.33 -- -- --  CKTOTAL -- -- 25 -- 27 32 --  CKMB -- -- 3.3 -- 3.1 3.6 --  TROPONINI -- -- <0.30 -- <0.30 <0.30 --   Estimated Creatinine Clearance: 54.7 ml/min (by C-G formula based on Cr of 1.06).   Medications:  Scheduled:     . azithromycin  500 mg Intravenous Q24H  . bisoprolol  5 mg Oral Daily  . budesonide  0.25 mg Nebulization BID  . cefTRIAXone (ROCEPHIN)  IV  1 g Intravenous Q24H  . cholecalciferol  1,000 Units Oral Daily  . clopidogrel  75 mg Oral Q breakfast  . digoxin  250 mcg Oral Daily  . diltiazem  240 mg Oral Daily  . furosemide  40 mg Intravenous BID  . ipratropium  500 mcg Nebulization QID  . isosorbide mononitrate  60 mg Oral Daily  . levalbuterol  0.63 mg Nebulization Q4H  . methylPREDNISolone (SOLU-MEDROL) injection  40 mg Intravenous Q8H  . pantoprazole  20 mg Oral Q1200  . potassium chloride  20 mEq Oral TID  . rosuvastatin  40 mg Oral Daily  . senna-docusate  2 tablet Oral QHS  . vancomycin  1,250 mg Intravenous Q12H  . DISCONTD: methylPREDNISolone (SOLU-MEDROL) injection  60 mg Intravenous Q6H  . DISCONTD: potassium chloride  20 mEq Oral  BID    Assessment:  83 YOM on chronic coumadin for hx Afib/ s/p St Jude single chamber PM insertion  Home dose of warfarin was 5mg  daily PTA, last dose 2/20 PTA  INR is still supratx but decreasing  No reported bleeding  Goal of Therapy:  INR 2-3   Plan:   Continue to hold warfarin  Monitor closely for signs and symptoms of bleeding  Reversal per Md if necessary  Gwen Her PharmD  [phone] 11/10/2011 11:32 AM

## 2011-11-11 ENCOUNTER — Ambulatory Visit (HOSPITAL_COMMUNITY): Payer: Medicare Other

## 2011-11-11 ENCOUNTER — Encounter (HOSPITAL_COMMUNITY): Admission: RE | Admit: 2011-11-11 | Payer: Medicare Other | Source: Ambulatory Visit

## 2011-11-11 ENCOUNTER — Ambulatory Visit: Payer: Medicare Other | Admitting: Pulmonary Disease

## 2011-11-11 ENCOUNTER — Ambulatory Visit: Payer: Medicare Other | Admitting: Internal Medicine

## 2011-11-11 DIAGNOSIS — R5381 Other malaise: Secondary | ICD-10-CM

## 2011-11-11 DIAGNOSIS — I5043 Acute on chronic combined systolic (congestive) and diastolic (congestive) heart failure: Secondary | ICD-10-CM

## 2011-11-11 DIAGNOSIS — F329 Major depressive disorder, single episode, unspecified: Secondary | ICD-10-CM

## 2011-11-11 DIAGNOSIS — R5383 Other fatigue: Secondary | ICD-10-CM

## 2011-11-11 LAB — BASIC METABOLIC PANEL
Calcium: 9 mg/dL (ref 8.4–10.5)
GFR calc Af Amer: 87 mL/min — ABNORMAL LOW (ref >90)
GFR calc non Af Amer: 75 mL/min — ABNORMAL LOW (ref >90)
Sodium: 133 mEq/L — ABNORMAL LOW (ref 135–145)

## 2011-11-11 LAB — PROTIME-INR
INR: 4.15 — ABNORMAL HIGH (ref 0.00–1.49)
Prothrombin Time: 40.7 seconds — ABNORMAL HIGH (ref 11.6–15.2)

## 2011-11-11 MED ORDER — FUROSEMIDE 40 MG PO TABS
40.0000 mg | ORAL_TABLET | Freq: Two times a day (BID) | ORAL | Status: DC
  Administered 2011-11-11 – 2011-11-15 (×8): 40 mg via ORAL
  Filled 2011-11-11 (×12): qty 1

## 2011-11-11 MED ORDER — MORPHINE SULFATE 10 MG/5ML PO SOLN
5.0000 mg | Freq: Once | ORAL | Status: AC
  Administered 2011-11-11: 5 mg via ORAL
  Filled 2011-11-11: qty 5

## 2011-11-11 NOTE — Progress Notes (Signed)
ANTICOAGULATION CONSULT NOTE - Follow Up Consult  Pharmacy Consult for warfarin Indication: hx Afib, s/p St Jude single chamber PM insertion  Vital Signs: Temp: 97.5 F (36.4 C) (02/25 0537) Temp src: Oral (02/25 0537) BP: 145/77 mmHg (02/25 0537) Pulse Rate: 77  (02/25 0537)  Labs:  Alvira Philips 11/11/11 0450 11/10/11 0425 11/09/11 0832 11/08/11 0905  HGB -- -- 15.9 --  HCT -- -- 45.1 --  PLT -- -- 212 --  APTT -- -- -- --  LABPROT 40.7* 44.3* 47.0* --  INR 4.15* 4.62* 4.98* --  HEPARINUNFRC -- -- -- --  CREATININE 0.97 1.06 1.11 --  CKTOTAL -- -- -- 25  CKMB -- -- -- 3.3  TROPONINI -- -- -- <0.30   Estimated Creatinine Clearance: 59.7 ml/min (by C-G formula based on Cr of 0.97).   Medications:  Scheduled:     . bisoprolol  5 mg Oral Daily  . budesonide  0.25 mg Nebulization BID  . cholecalciferol  1,000 Units Oral Daily  . clopidogrel  75 mg Oral Q breakfast  . digoxin  250 mcg Oral Daily  . diltiazem  240 mg Oral Daily  . feeding supplement  237 mL Oral BID  . furosemide  40 mg Oral BID  . ipratropium  500 mcg Nebulization QID  . isosorbide mononitrate  60 mg Oral Daily  . levalbuterol  0.63 mg Nebulization Q4H  . levofloxacin  750 mg Oral q1800  . pantoprazole  20 mg Oral Q1200  . potassium chloride  20 mEq Oral TID  . predniSONE  40 mg Oral Q breakfast  . rosuvastatin  40 mg Oral Daily  . senna-docusate  2 tablet Oral QHS  . DISCONTD: azithromycin  500 mg Intravenous Q24H  . DISCONTD: azithromycin  500 mg Oral Q24H  . DISCONTD: cefTRIAXone (ROCEPHIN)  IV  1 g Intravenous Q24H  . DISCONTD: furosemide  40 mg Intravenous BID  . DISCONTD: methylPREDNISolone (SOLU-MEDROL) injection  40 mg Intravenous Q8H  . DISCONTD: potassium chloride  20 mEq Oral BID  . DISCONTD: vancomycin  1,250 mg Intravenous Q12H    Assessment:  83 YOM on chronic coumadin for hx Afib/ s/p St Jude single chamber PM insertion  Home dose of warfarin was 5mg  daily PTA, last dose 2/20 PTA.   Per [place] 's anti-coagulation clinic notes, patient was stable on this dose since 08/2011.  INR is still supratherapeutic but decreasing  No reported bleeding  Agree with cardiology to discharge patient with Warfarin at 2.5mg  daily once INR appropriate with close f/u considering current drug-drug interactions - levaquin, prednisone.  Goal of Therapy:  INR 2-3   Plan:   Continue to hold warfarin  Monitor closely for signs and symptoms of bleeding  Reversal per Md if necessary  Clance Boll, PharmD Pager: [phone] 11/11/2011 7:39 AM

## 2011-11-11 NOTE — Evaluation (Signed)
Physical Therapy Evaluation Patient Details Name: [name] MRN: [id number] DOB: 12-Sep-1928 Today's Date: 11/11/2011  Problem List:  Patient Active Problem List  Diagnoses  . Atrial fibrillation  . CHF (congestive heart failure)  . Diabetes mellitus  . Abdominal aortic aneurysm  . COPD, severe  . Pacemaker  . Post-nasal drip  . Unstable angina  . NSTEMI (non-ST elevated myocardial infarction)  . CAD (coronary artery disease)  . Dyspnea  . HLD (hyperlipidemia)  . COPD exacerbation  . Spiritual Distress  . Acute on chronic combined systolic and diastolic heart failure    Past Medical History:  Past Medical History  Diagnosis Date  . Pneumonia 12/06/10    healthcare-associated/ left lower lobe  . COPD (chronic obstructive pulmonary disease)     severe stage IV  . Atrial fibrillation     on Coumadin with St. Jude single-chamber pacemaker  . Diabetes mellitus     type 2; s/p Prednisone therapy for pneumonia 12/2010  . Coronary artery disease     s/p anterior STEMI 09/2009; cath. revealed mid LAD 40%, distal LAD 100%- PTCA, prox. RCA 40%, mid. RCA 100% with good collateral filling of PDA; EF 25%; NSTEMI 07/2011 - PCI/DES 100% LCX  . ST elevation (STEMI) myocardial infarction 01//11/12    anterior  . CHF (congestive heart failure)     EF 25% on 09/26/10; EF 50-55% and grade 1 diastolic dysfunction on ECHO 08/2010   . Hypertension   . Hyperlipidemia   . AAA (abdominal aortic aneurysm)     3 cm infrarenal abdominal aortic aneurysm per aorta ultrasound 03/2010  . Osteoarthritis     s/p bilat hip arthroplasty  . Angina   . Ischemic cardiomyopathy     EF 35% LHC 11/12  . GERD (gastroesophageal reflux disease)   . Hypercholesteremia   . PVD (peripheral vascular disease)   . Anxiety    Past Surgical History:  Past Surgical History  Procedure Date  . Hip arthroplasty     s/p right 09/27/10 and s/p left 09/24/10  . Back surgery   . Knee surgery   . Pacemaker  insertion 12/2010    St. Jude single-chamber   . Cardiac catheterization 09/2009, 06/2007    PTCA to distal LAD    PT Assessment/Plan/Recommendation PT Assessment Clinical Impression Statement: Pt admitted for atrial fib and COPD exacerbation.  Pt would benefit from acute PT services in order to improve independence with transfers and gait as well as improve activity tolerance.  Pt reports he lives alone and for last week his daughter has been providing meals.  Pt will likely need increased care upon d/c either at ALF or SNF 2* pt not able to tolerate much activity due to DOE. PT Recommendation/Assessment: Patient will need skilled PT in the acute care venue PT Problem List: Decreased strength;Decreased activity tolerance;Decreased mobility;Decreased knowledge of use of DME;Cardiopulmonary status limiting activity PT Therapy Diagnosis : Difficulty walking;Generalized weakness PT Plan PT Frequency: Min 3X/week PT Treatment/Interventions: DME instruction;Gait training;Functional mobility training;Therapeutic activities;Therapeutic exercise;Balance training;Patient/family education PT Recommendation Follow Up Recommendations: Supervision/Assistance - 24 hour;Skilled nursing facility Equipment Recommended: None recommended by PT PT Goals  Acute Rehab PT Goals PT Goal Formulation: With patient Time For Goal Achievement: 2 weeks Pt will go Supine/Side to Sit: with modified independence;with HOB 0 degrees PT Goal: Supine/Side to Sit - Progress: Goal set today Pt will go Sit to Stand: with supervision PT Goal: Sit to Stand - Progress: Goal set today Pt will go Stand  to Sit: with supervision PT Goal: Stand to Sit - Progress: Goal set today Pt will Ambulate: 51 - 150 feet;with supervision;with least restrictive assistive device PT Goal: Ambulate - Progress: Goal set today Pt will Perform Home Exercise Program: with supervision, verbal cues required/provided PT Goal: Perform Home Exercise Program  - Progress: Goal set today  PT Evaluation Precautions/Restrictions  Precautions Precautions: Fall Prior Functioning  Home Living Lives With: Alone Type of Home: House Home Layout: One level Home Access: Stairs to enter Entrance Stairs-Rails: None Entrance Stairs-Number of Steps: 1 Home Adaptive Equipment: Environmental consultant - four wheeled;Walker - rolling;Straight cane Prior Function Level of Independence: Requires assistive device for independence;Independent with basic ADLs Comments: For past week, daughter has been providing meals Cognition   Sensation/Coordination   Extremity Assessment RLE Strength RLE Overall Strength Comments: grossly at least 3+/5 per functional observation LLE Strength LLE Overall Strength Comments: grossly at least 3+/5 per functional observation Mobility (including Balance) Bed Mobility Bed Mobility: Yes Supine to Sit: 5: Supervision Supine to Sit Details (indicate cue type and reason): verbal cues for technique Transfers Transfers: Yes Sit to Stand: 4: Min assist;From bed Sit to Stand Details (indicate cue type and reason): min/guard, pt kept hands on RW despite verbal cue Stand to Sit: 4: Min assist;With upper extremity assist;With armrests;To chair/3-in-1 Stand to Sit Details: min/guard, verbal cue to use armrest Ambulation/Gait Ambulation/Gait: Yes Ambulation/Gait Assistance: 4: Min assist Ambulation/Gait Assistance Details (indicate cue type and reason): pt with limited distance 2* dyspnea on exertion however SaO2 91% on 2L Ambulation Distance (Feet): 5 Feet Assistive device: Rolling walker Gait Pattern: Step-through pattern;Trunk flexed    Exercise    End of Session PT - End of Session Equipment Utilized During Treatment: Gait belt Patient left: in chair;with call bell in reach General Behavior During Session: [facility] for tasks performed Cognition: [facility] for tasks performed  Terena Bohan,KATHrine E 11/11/2011, 12:43 PM Pager: [phone]

## 2011-11-11 NOTE — Plan of Care (Signed)
Problem: Phase II Progression Outcomes Goal: Dyspnea controlled w/progressive activity Outcome: Not Progressing Pt only able to tolerate 5 feet of ambulation due to dyspnea (SaO2 91% on 2L).

## 2011-11-11 NOTE — Progress Notes (Signed)
CARE MANAGEMENT NOTE 11/11/2011  Patient:  ABIR, CRAINE   Account Number:  [id number]  Date Initiated:  11/08/2011  Documentation initiated by:  Nedda Gains  Subjective/Objective Assessment:   pt with hx of severe copd having acute excerbation and failed outpt treatment     Action/Plan:   lives at home   Anticipated DC Date:  11/14/2011   Anticipated DC Plan:  HOME/SELF CARE         Choice offered to / List presented to:             Status of service:  In process, will continue to follow Medicare Important Message given?   (If response is "NO", the following Medicare IM given date fields will be blank) Date Medicare IM given:   Date Additional Medicare IM given:    Discharge Disposition:    Per UR Regulation:  Reviewed for med. necessity/level of care/duration of stay  Comments:  [id number]/Alston Berrie,RN,BSN,CCM A.Fib continues, iv cardizem drip

## 2011-11-11 NOTE — Progress Notes (Signed)
Subjective: Still c/o class 3-4 dyspnea C/o frank depressive symoptoms    - fatigue, thoughts of dying, thought of different modalities to kill himself over past several years but does not have will to execute, says he will be happiest if he did not wake up in am and was dead. No crying spells. Misses wife ? Complicated grief. Refused psych consult. Wants to talk to dtr before we start anti depressants.   Objective: Vital signs in last 24 hours: Blood pressure 129/81, pulse 85, temperature 98.1 F (36.7 C), temperature source Oral, resp. rate 17, height 5\' 9"  (1.753 m), weight 72.938 kg (160 lb 12.8 oz), SpO2 97.00%.  Intake/Output from previous day: 02/24 0701 - 02/25 0700 In: 2200 [P.O.:2160; I.V.:40] Out: 3000 [Urine:3000]   Physical Exam:   wd male in nad Chest with very decreased bs, no wheezing. Labored breathing - baseline Cor with distant heart sounds, ?regular today abd benign Alert and oriented, moves all 4.    Lab Results:  [facility] 11/09/11 0832  WBC 18.0*  HGB 15.9  HCT 45.1  PLT 212   BMET  Basename 11/11/11 0450 11/10/11 0425 11/09/11 0832  NA 133* 133* 132*  K 4.1 3.6 3.6  CL 93* 95* 94*  CO2 29 27 25   GLUCOSE 275* 292* 301*  BUN 30* 34* 33*  CREATININE 0.97 1.06 1.11  CALCIUM 9.0 8.8 9.2    Studies/Results: No results found.  Assessment/Plan: Patient Active Hospital Problem List:  COPD exacerbation (11/07/2011) and Refractory dyspnea Pt is improved with steroids, abx, and BD.  But appears to have refractory class 3-4 dyspnea due to gold stage 4 copd. Will try test dose oral morphine (allergy hx enquired and ok to challenge and see)  CHF (congestive heart failure) (12/26/2010) Diuresing well, and being followed by cardiology    Diabetes mellitus (12/26/2010) Blood sugars elevated due to high dose steroids.  Will change to po prednisone and follow for next 12-24hrs.  If no better, would start ssi.   DEpression  - Clinically depressed for  sure. Refusing pscyh consult. He is open to trying medication but he wants to talk to dtr about it. He does not want [place] totalk to his dtr about depression.   Fatigue  - testosterone level 100 appropriate for age. Currently copd, depression, chf are likely etiologies. See depression section which should be top priority  Goals of care  - He is willing to consider homehospice. Agreed to meet with palliative care for symptom mmgt (dyspnea, fatigue and depression) and discuss goals of care. Wants dtr involved in this meeting      Dr. Kalman Shan, M.D., [facility].C.P Pulmonary and Critical Care Medicine Staff Physician [facility] [place] [facility] Pager: [phone], If no answer or between  15:00h - 7:00h: call [phone]  11/11/2011 2:53 PM

## 2011-11-11 NOTE — Progress Notes (Signed)
@   Subjective:  Denies CP; DOE but improved   Objective:  Filed Vitals:   11/10/11 2136 11/10/11 2317 11/11/11 0352 11/11/11 0537  BP: 127/72   145/77  Pulse: 92   77  Temp: 97.4 F (36.3 C)   97.5 F (36.4 C)  TempSrc: Oral   Oral  Resp: 18   20  Height:      Weight:    160 lb 12.8 oz (72.938 kg)  SpO2: 95% 95% 96% 96%    Intake/Output from previous day:  Intake/Output Summary (Last 24 hours) at 11/11/11 0715 Last data filed at 11/11/11 0539  Gross per 24 hour  Intake   1840 ml  Output   3000 ml  Net  -1160 ml    Physical Exam: Physical exam: Well-developed chronically ill appearing in no acute distress.  Skin is warm and dry.  HEENT is normal.  Neck is supple. No thyromegaly.  Chest is diminished BS throughout Cardiovascular exam is irregular Abdominal exam nontender or distended. No masses palpated. Extremities show no edema. neuro grossly intact    Lab Results: Basic Metabolic Panel:  Basename 11/11/11 0450 11/10/11 0425  NA 133* 133*  K 4.1 3.6  CL 93* 95*  CO2 29 27  GLUCOSE 275* 292*  BUN 30* 34*  CREATININE 0.97 1.06  CALCIUM 9.0 8.8  MG -- --  PHOS -- --   CBC:  Basename 11/09/11 0832  WBC 18.0*  NEUTROABS --  HGB 15.9  HCT 45.1  MCV 88.3  PLT 212   Cardiac Enzymes:  Basename 11/08/11 0905  CKTOTAL 25  CKMB 3.3  CKMBINDEX --  TROPONINI <0.30     Assessment/Plan:  1) atrial fibrillation - continue cardizem and bisoprolol; resume coumadin when INR improves. Would decrease coumadin to 2.5 mg daily at dc with close fu of INR. 2) S/P pacemker - WCT noted on telemetry; appears to be vpacing; interrogate pacemaker; note EF 40-45; continue beta blocker 3) CAD - continue plavix and statin 4) COPD - per pulmonary 5) Acute on chronic systolic and diastolic CHF; euvolemic on exam; change lasix to po FU Dr Ladona Ridgel after DC; If interrogation of pacemaker unremarkable, patient can be dced from a cardiac standpoint. Please call with  questions. Olga Millers 11/11/2011, 7:15 AM

## 2011-11-11 NOTE — Progress Notes (Signed)
11/11/2011 Palliative Medicine Team SW 4:26 PM PMT received consult to address GOC and Sx mgmt needs. GOC scheduled with Pt and dtr [name] for tomorrow 11/12/11 at  1:30 pm.   Also spoke with pt at bedside at length for psychosocial support. Pt very candid about his being "ready to go" and passive suicidal ideation; denies any intent/plan. When asked if he feels depressed, pt states "everybody's depressed". Pt exhibits jovial affect despite reports of sleep disturbances and decreased quality of life due to physical limitations and recurrent hospitalizations.   Pt gave extensive history of having lived a "good life" including growing up on a farm in [place] and meeting his eventual wife, Teryl Lucy, at age 83. Wife died in early 2000s and pt open about his grief over her loss. Pt and wife had two children, [name], local to [employer], and a son in [place] who suffered a TBI during [employer] career. Pt also reports 4 grandchildren and a great granddaughter born this past December. 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   Work has been an integral part of pt's life, and the decrease in his independence over the last year has had a strong emotional impact. Pt refuses to speak with psychiatry, but very open to this clinician's follow up. Pt clearly states his desire "not to prolong life", but to "be kept comfortable". Pt admits to high pain tolerance; encouraged him to report any pain experienced to RN.   Pt was open to this Clinical research associate calling daughter to schedule GOC for tomorrow, but asked for opportunity to be the one to speak with his dtr about his EOL choices and potential treatments for depression. Pt states he will discuss these with his dtr prior to [facility] tomorrow.   Kennieth Francois, [facility] Team phone [phone]

## 2011-11-11 NOTE — Progress Notes (Signed)
Inpatient Diabetes Program Recommendations  AACE/ADA: New Consensus Statement on Inpatient Glycemic Control (2009)  Target Ranges:  Prepandial:   less than 140 mg/dL      Peak postprandial:   less than 180 mg/dL (1-2 hours)      Critically ill patients:  140 - 180 mg/dL   Reason for Visit: Hyperglycemia  Results for [name], [name] (MRN [id number]) as of 11/11/2011 15:15  Ref. Range 11/09/2011 08:32 11/10/2011 04:25 11/11/2011 04:50  Glucose Latest Range: 70-99 mg/dL 409 (H) 811 (H) 914 (H)    Inpatient Diabetes Program Recommendations Correction (SSI): Add Novolog sensitive tidwc  Note: Will follow.

## 2011-11-11 NOTE — Progress Notes (Signed)
CSW following for possible ALF requested by family. Pt not currently interested. CSW would appreciate PT input to assist with plans for Pt. CSW to follow and assist as appropriate.  Vennie Homans, [place] 11/11/2011 11:19 AM [phone]

## 2011-11-12 DIAGNOSIS — R5383 Other fatigue: Secondary | ICD-10-CM

## 2011-11-12 DIAGNOSIS — R627 Adult failure to thrive: Secondary | ICD-10-CM

## 2011-11-12 DIAGNOSIS — F329 Major depressive disorder, single episode, unspecified: Secondary | ICD-10-CM

## 2011-11-12 DIAGNOSIS — R5381 Other malaise: Secondary | ICD-10-CM

## 2011-11-12 DIAGNOSIS — J441 Chronic obstructive pulmonary disease with (acute) exacerbation: Secondary | ICD-10-CM

## 2011-11-12 LAB — BASIC METABOLIC PANEL
BUN: 31 mg/dL — ABNORMAL HIGH (ref 6–23)
Creatinine, Ser: 1.07 mg/dL (ref 0.50–1.35)
GFR calc Af Amer: 73 mL/min — ABNORMAL LOW (ref >90)
GFR calc non Af Amer: 63 mL/min — ABNORMAL LOW (ref >90)

## 2011-11-12 MED ORDER — WARFARIN SODIUM 1 MG PO TABS
1.0000 mg | ORAL_TABLET | Freq: Once | ORAL | Status: AC
  Administered 2011-11-12: 1 mg via ORAL
  Filled 2011-11-12: qty 1

## 2011-11-12 MED ORDER — MORPHINE SULFATE 10 MG/5ML PO SOLN
2.5000 mg | Freq: Four times a day (QID) | ORAL | Status: DC
  Administered 2011-11-12 – 2011-11-13 (×5): 2.5 mg via ORAL
  Filled 2011-11-12 (×6): qty 5

## 2011-11-12 MED ORDER — SENNA 8.6 MG PO TABS
2.0000 | ORAL_TABLET | Freq: Two times a day (BID) | ORAL | Status: DC
  Administered 2011-11-12 – 2011-11-15 (×6): 17.2 mg via ORAL
  Filled 2011-11-12 (×4): qty 2

## 2011-11-12 NOTE — Progress Notes (Signed)
ANTICOAGULATION CONSULT NOTE - Follow Up Consult  Pharmacy Consult for warfarin Indication: hx Afib, s/p St Jude single chamber PM insertion  Vital Signs: Temp: 97.3 F (36.3 C) (02/26 0504) Temp src: Oral (02/26 0504) BP: 155/76 mmHg (02/26 0504) Pulse Rate: 88  (02/26 0504)  Labs:  Basename 11/12/11 0432 11/11/11 0450 11/10/11 0425  HGB -- -- --  HCT -- -- --  PLT -- -- --  APTT -- -- --  LABPROT 31.0* 40.7* 44.3*  INR 2.93* 4.15* 4.62*  HEPARINUNFRC -- -- --  CREATININE 1.07 0.97 1.06  CKTOTAL -- -- --  CKMB -- -- --  TROPONINI -- -- --   Estimated Creatinine Clearance: 53.8 ml/min (by C-G formula based on Cr of 1.07).   Medications:  Scheduled:     . bisoprolol  5 mg Oral Daily  . budesonide  0.25 mg Nebulization BID  . cholecalciferol  1,000 Units Oral Daily  . clopidogrel  75 mg Oral Q breakfast  . digoxin  250 mcg Oral Daily  . diltiazem  240 mg Oral Daily  . feeding supplement  237 mL Oral BID  . furosemide  40 mg Oral BID  . ipratropium  500 mcg Nebulization QID  . isosorbide mononitrate  60 mg Oral Daily  . levalbuterol  0.63 mg Nebulization Q4H  . levofloxacin  750 mg Oral q1800  . morphine  2.5 mg Oral Q6H  . morphine  5 mg Oral Once  . pantoprazole  20 mg Oral Q1200  . potassium chloride  20 mEq Oral TID  . predniSONE  40 mg Oral Q breakfast  . rosuvastatin  40 mg Oral Daily  . senna  2 tablet Oral BID  . DISCONTD: senna-docusate  2 tablet Oral QHS    Assessment:  83 YOM on chronic coumadin for hx Afib/ s/p St Jude single chamber PM insertion  Home dose of warfarin was 5mg  daily PTA, last dose 2/20 PTA.  Per [facility] anti-co[facility] clinic notes, patient was stable on this dose since 08/2011.  INR now within therapeutic range after holding doses since admission (6 days holding warfarin).   No reported bleeding  Agree with cardiology to discharge patient with Warfarin at 2.5mg  daily once INR appropriate with close f/u considering current  drug-drug interactions - levaquin, prednisone.  Will restart at low dose today to see how patient is responding.  Of note, patient is also on Plavix.  Goal of Therapy:  INR 2-3   Plan:   Restart coumadin at 1mg  PO once daily. Follow up INR in AM.  Clance Boll, PharmD Pager: [phone] 11/12/2011 10:32 AM

## 2011-11-12 NOTE — Progress Notes (Signed)
11/12/2011 Palliative Medicine Team SW 2:05 PM Follow up psychosocial visit with pt, dtr and sil. Family report primary questions for today's GOC as pt's medical status, potential for rehab and options for more care either in ALF/SNF. Spoke with Dtr and SIL outside of room and they are very reasonable in expectations and are on board with whatever pt decides for EOL preferences. Discussed potential benefits of pt moving to facility both for physical and emotional care needs. Helped family facilitate conversation about fact that dtr and sil do not feel that pt is a "burden" and are happy to support pt. Dr. Marchelle Gearing came in as we finished our visit to update family. PMT provider to follow shortly.   Kennieth Francois, [place] Pager [phone]

## 2011-11-12 NOTE — Consult Note (Signed)
Consult Note from the Palliative Medicine Team at [facility] Patient [name]      DOB: 28-Jan-1928      NFA:[id number]   Consult Requested by:Dr. Hortense [name]    PCP: Darin Baars, MD, MD Reason for Consultation:GOC and related symptom  Phone Number:[phone]  Assessment and Plan:  78 yr old with multiple comorbities 1. Code Status:Patient is currently a limited code per Dr. Monica [name].  HIs daughter states he is a do not resusitate 2. Symptom Control: 1. Depression:  We engaged in a brief session geared toward dignity therapy.  [name] opened up nicely and even volunteered that he would like to go to [facility].  He has been there before and he knows people there and they like him.  He did not obtain any relief in the past from antidepressant and has been refusing to take them or be evaluated. He does have that capacity for insight and did well with dignity therapy.  Would recommend PCS to follow at discharge to continue to council the patient 2. Dyspnea: patient will benefit from energy conservation exercises, and is currently on low dose Roxanol which he seems to be tolerating (noted morphine in allergies) this can be continued if he tolerating it 3. Debilitated will need significant rehab 3. 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 4. Spiritual:  Religion is not his first choice for affirmation but he can relate to its principals. 5. Disposition:  Once medically stable patient has decided to proceed on to [facility] for rehab.  I would recommend PCS services to continue to help the patient see the positives in each of his days,  And adjust medications for symptom management   Patient Documents Completed or Given: Document Given Completed  Advanced Directives Pkt    MOST    DNR    Gone from My Sight    Hard Choices      Brief XLK:[name] is a 78 year old white male with Gold's Class 3  Copd admitted with sever shortness of breath slow to respond to standard treatment and failure to thrive since November 2012.  He had been expressing apathy and admitted that he would consider dying if he had to continue on in a debilitated state.  He refused to consider a psych consult.  PMT was consult to try to assist with his symptoms of depression nd help him develop goals of care.  His son in law and daughter were present.  I reviewed the case with Dr. Marchelle [name] .  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  We were able to draw parallels with these times. His daughter called them 'adventures' We were able to help him see that this time in his life was Slap an adventure that did not warrant just sitting around waiting to die.  Half way through he stopped and said to his daughter "how about we go over to [facility], I liked it there".    ROS:  Sleeps only fair, dyspnea with exertion,  No nausea or vomiting just doesn't have an appetite.  No diarrhea.  Denies suicidal ideation but states he would rather die than live like this    PMH:  Past Medical History  Diagnosis Date  . Pneumonia 12/06/10    healthcare-associated/ left lower lobe  .  COPD (chronic obstructive pulmonary disease)     severe stage IV  . Atrial fibrillation     on Coumadin with St. Jude single-chamber pacemaker  . Diabetes mellitus     type 2; s/p Prednisone therapy for pneumonia 12/2010  . Coronary artery disease     s/p anterior STEMI 09/2009; cath. revealed mid LAD 40%, distal LAD 100%- PTCA, prox. RCA 40%, mid. RCA 100% with good collateral filling of PDA; EF 25%; NSTEMI 07/2011 - PCI/DES 100% LCX  . ST elevation (STEMI) myocardial infarction 01//11/12    anterior  . CHF (congestive heart failure)     EF 25% on 09/26/10; EF 50-55% and grade 1 diastolic dysfunction on ECHO 08/2010   . Hypertension   . Hyperlipidemia    . AAA (abdominal aortic aneurysm)     3 cm infrarenal abdominal aortic aneurysm per aorta ultrasound 03/2010  . Osteoarthritis     s/p bilat hip arthroplasty  . Angina   . Ischemic cardiomyopathy     EF 35% LHC 11/12  . GERD (gastroesophageal reflux disease)   . Hypercholesteremia   . PVD (peripheral vascular disease)   . Anxiety      PSH: Past Surgical History  Procedure Date  . Hip arthroplasty     s/p right 09/27/10 and s/p left 09/24/10  . Back surgery   . Knee surgery   . Pacemaker insertion 12/2010    St. Jude single-chamber   . Cardiac catheterization 09/2009, 06/2007    PTCA to distal LAD   I have reviewed the FH and SH and  If appropriate update it with new information. Allergies  Allergen Reactions  . Morphine Itching   Scheduled Meds:   . bisoprolol  5 mg Oral Daily  . budesonide  0.25 mg Nebulization BID  . cholecalciferol  1,000 Units Oral Daily  . clopidogrel  75 mg Oral Q breakfast  . digoxin  250 mcg Oral Daily  . diltiazem  240 mg Oral Daily  . feeding supplement  237 mL Oral BID  . furosemide  40 mg Oral BID  . ipratropium  500 mcg Nebulization QID  . isosorbide mononitrate  60 mg Oral Daily  . levalbuterol  0.63 mg Nebulization Q4H  . levofloxacin  750 mg Oral q1800  . morphine  2.5 mg Oral Q6H  . morphine  5 mg Oral Once  . pantoprazole  20 mg Oral Q1200  . potassium chloride  20 mEq Oral TID  . predniSONE  40 mg Oral Q breakfast  . rosuvastatin  40 mg Oral Daily  . senna  2 tablet Oral BID  . warfarin  1 mg Oral ONCE-1800  . DISCONTD: senna-docusate  2 tablet Oral QHS   Continuous Infusions:  PRN Meds:.sodium chloride, levalbuterol, levalbuterol, nitroGLYCERIN, oxyCODONE    BP 150/78  Pulse 88  Temp(Src) 97.8 F (36.6 C) (Oral)  Resp 20  Ht 5\' 9"  (1.753 m)  Wt 70.3 kg (154 lb 15.7 oz)  BMI 22.89 kg/m2  SpO2 96%   PPS: 30-40 % limited dyspnea   Intake/Output Summary (Last 24 hours) at 11/12/11 1441 Last data filed at  11/12/11 1421  Gross per 24 hour  Intake    240 ml  Output   3025 ml  Net  -2785 ml   LBM: None recorded                      Stool Softner:senekot added today  Physical Exam:  General: [place]  appearance, no acute distress at rest HEENT: PERRL, EOMI, anciteric ,mm dry,no jvd Chest:   Distant course breath sounds with end expiratory wheeze CVS: distant , regular, S1,S2 Abdomen:mildly distended, but not tender no guarding or rebound Ext:  : no edema Neuro: awake alert with appropriate range of emotions.  Labs: CBC    Component Value Date/Time   WBC 18.0* 11/09/2011 0832   RBC 5.11 11/09/2011 0832   HGB 15.9 11/09/2011 0832   HCT 45.1 11/09/2011 0832   PLT 212 11/09/2011 0832   MCV 88.3 11/09/2011 0832   MCH 31.1 11/09/2011 0832   MCHC 35.3 11/09/2011 0832   RDW 13.0 11/09/2011 0832   LYMPHSABS 1.0 11/07/2011 1715   MONOABS 0.6 11/07/2011 1715   EOSABS 0.0 11/07/2011 1715   BASOSABS 0.0 11/07/2011 1715      CMP     Component Value Date/Time   NA 133* 11/12/2011 0432   K 4.4 11/12/2011 0432   CL 95* 11/12/2011 0432   CO2 30 11/12/2011 0432   GLUCOSE 168* 11/12/2011 0432   BUN 31* 11/12/2011 0432   CREATININE 1.07 11/12/2011 0432   CALCIUM 9.0 11/12/2011 0432   PROT 6.4 11/07/2011 1715   ALBUMIN 3.9 11/07/2011 1715   AST 20 11/07/2011 1715   ALT 26 11/07/2011 1715   ALKPHOS 65 11/07/2011 1715   BILITOT 0.7 11/07/2011 1715   GFRNONAA 63* 11/12/2011 0432   GFRAA 73* 11/12/2011 0432    Chest Xray Reviewed/Impressions: No acute cardiopulmonary abnormality      Time In Time Out Total Time Spent with Patient Total Overall Time  250 pm 350 pm 60 min 60 min   Discussed at length with Dr. Catalina Pizza.  Of note patient would benefit from Hospice care at any time now.  He is not open to discussing much right now but can address this with PCS at the skilled facility since his hope was to do some rehab.  Greater than 50%  of this time was spent counseling and coordinating care related to the  above assessment and plan.  Rhondalyn Clingan L. Ladona Ridgel, MD MBA The Palliative Medicine Team at [facility] Phone: [phone] Pager: [phone]

## 2011-11-12 NOTE — Progress Notes (Signed)
Subjective: c/o class 3-4 dyspnea - but morphine helped a lot  Depression - family state that he has long hx of sadness worsened in winter. Prior prozac did not help. He is not willing to discuss this further in front of dtr  Wants palliation only.     Objective: Vital signs in last 24 hours: Blood pressure 150/78, pulse 88, temperature 97.8 F (36.6 C), temperature source Oral, resp. rate 20, height 5\' 9"  (1.753 m), weight 70.3 kg (154 lb 15.7 oz), SpO2 96.00%.  Intake/Output from previous day: 02/25 0701 - 02/26 0700 In: 360 [P.O.:240; I.V.:120] Out: 2100 [Urine:2100]   Physical Exam:   wd male in nad Chest with very decreased bs, no wheezing. Labored breathing - baseline Cor with distant heart sounds, ?regular today abd benign Alert and oriented, moves all 4.    Lab Results: No results found for this basename: WBC:3,HGB:3,HCT:3,PLT:3 in the last 72 hours BMET  Basename 11/12/11 0432 11/11/11 0450 11/10/11 0425  NA 133* 133* 133*  K 4.4 4.1 3.6  CL 95* 93* 95*  CO2 30 29 27   GLUCOSE 168* 275* 292*  BUN 31* 30* 34*  CREATININE 1.07 0.97 1.06  CALCIUM 9.0 9.0 8.8    Studies/Results: No results found.  Assessment/Plan: Patient Active Hospital Problem List:  COPD exacerbation (11/07/2011) and Refractory dyspnea Pt is improved with steroids, abx, and BD.  But appears to have refractory class 3-4 dyspnea due to gold stage 4 copd. Tolerated test dose oral moprhine well without ADR and found relief from dyspnea. Will start low dose scheduled  CHF (congestive heart failure) (12/26/2010) Diuresing well, and being followed by cardiology    Diabetes mellitus (12/26/2010) Blood sugars elevated due to high dose steroids.  Will change to po prednisone and follow for next 12-24hrs.  If no better, would start ssi.   DEpression  - Clinically depressed for sure. Refused pscyh consult. Now not open to trying medications. Now reluctant to address this issue in front of family.  Dtr thinks SSRI or SNRI will benefit him. Dr Ladona Ridgel from palliative care will addres this issue hopefully. I will continue discussions on this  Fatigue - definitely due to copd, chf, failure to thrive but also depression contributing. D/ Dr Talmage Nap informally who feels testosterone level is low and could be contributing. I d/w patient about anti-deperssants and testim gel but he is not sure (check PSA).    Goals of care  - Palliative care momentarily on 11/12/2011 2:41 PM withDr Ladona Ridgel to whom I addressed above issues        Dr. Kalman Shan, M.D., [facility] Pulmonary and Critical Care Medicine Staff Physician [facility] Pager: [phone], If no answer or between  15:00h - 7:00h: call [phone]  11/12/2011 2:38 PM

## 2011-11-12 NOTE — Progress Notes (Signed)
CSW met with Pt at length to assess needs for d/c and offer support. Pt agrees that he needs more help than he was receiving at home prior to admission. He is open to ALF or SNF, but is looking forward to his GOC meeting this afternoon. Pt expressed that he has made all his funeral arrangements and is ready for his death. He stated no one else was open to this idea. Pt seemed at peace with his medical condition. He denied current issues with anxiety or depression. CSW will follow up with Pt in the am after GOC and plan is finalized. CSW left ALF and SNF lists for Pt and his daughter.  Vennie Homans, [place] 11/12/2011 11:21 AM [phone]

## 2011-11-12 NOTE — Progress Notes (Signed)
11/12/11 Patient c/o some regurgitation issues, especially after eating meals. MD on call was notified. No new orders at this time.Will continue to monitor patient's condition.

## 2011-11-13 ENCOUNTER — Encounter (HOSPITAL_COMMUNITY): Payer: Medicare Other

## 2011-11-13 DIAGNOSIS — F329 Major depressive disorder, single episode, unspecified: Secondary | ICD-10-CM

## 2011-11-13 DIAGNOSIS — R627 Adult failure to thrive: Secondary | ICD-10-CM

## 2011-11-13 DIAGNOSIS — R5383 Other fatigue: Secondary | ICD-10-CM

## 2011-11-13 DIAGNOSIS — J441 Chronic obstructive pulmonary disease with (acute) exacerbation: Secondary | ICD-10-CM

## 2011-11-13 DIAGNOSIS — R5381 Other malaise: Secondary | ICD-10-CM

## 2011-11-13 LAB — CULTURE, BLOOD (ROUTINE X 2)
Culture  Setup Time: 201302212151
Culture: NO GROWTH

## 2011-11-13 LAB — BASIC METABOLIC PANEL
CO2: 29 mEq/L (ref 19–32)
Calcium: 9.6 mg/dL (ref 8.4–10.5)
Creatinine, Ser: 1.24 mg/dL (ref 0.50–1.35)
Glucose, Bld: 152 mg/dL — ABNORMAL HIGH (ref 70–99)

## 2011-11-13 LAB — PSA: PSA: 6.57 ng/mL — ABNORMAL HIGH (ref <=4.00)

## 2011-11-13 LAB — ALPHA-1 ANTITRYPSIN PHENOTYPE

## 2011-11-13 LAB — PROTIME-INR: INR: 2.15 — ABNORMAL HIGH (ref 0.00–1.49)

## 2011-11-13 MED ORDER — WARFARIN SODIUM 3 MG PO TABS
3.0000 mg | ORAL_TABLET | Freq: Once | ORAL | Status: AC
  Filled 2011-11-13: qty 1

## 2011-11-13 MED ORDER — WARFARIN - PHARMACIST DOSING INPATIENT
Freq: Every day | Status: DC
  Filled 2011-11-13 (×4): qty 1

## 2011-11-13 MED ORDER — FLEET ENEMA 7-19 GM/118ML RE ENEM
1.0000 | ENEMA | Freq: Every day | RECTAL | Status: DC | PRN
  Administered 2011-11-13: 1 via RECTAL
  Filled 2011-11-13: qty 1

## 2011-11-13 MED ORDER — BISACODYL 10 MG RE SUPP
10.0000 mg | Freq: Every day | RECTAL | Status: DC | PRN
  Filled 2011-11-13: qty 1

## 2011-11-13 MED ORDER — SENNOSIDES-DOCUSATE SODIUM 8.6-50 MG PO TABS
2.0000 | ORAL_TABLET | Freq: Every day | ORAL | Status: DC
  Filled 2011-11-13 (×4): qty 2

## 2011-11-13 MED ORDER — POLYETHYLENE GLYCOL 3350 17 G PO PACK
17.0000 g | PACK | Freq: Every day | ORAL | Status: DC
  Administered 2011-11-13 – 2011-11-15 (×3): 17 g via ORAL
  Filled 2011-11-13 (×4): qty 1

## 2011-11-13 MED ORDER — BISACODYL 10 MG RE SUPP
10.0000 mg | Freq: Once | RECTAL | Status: AC
  Administered 2011-11-13: 10 mg via RECTAL

## 2011-11-13 NOTE — Progress Notes (Signed)
CSW spoke with [facility] SNF and they may be able to accept Pt tomorrow. CSW to follow and assist.  Jodelle Red 11/13/2011 12:56 PM #[phone]

## 2011-11-13 NOTE — Progress Notes (Signed)
ANTICOAGULATION CONSULT NOTE - Follow Up Consult  Pharmacy Consult for warfarin Indication: hx Afib, s/p St Jude single chamber PM insertion  Vital Signs: Temp: 97.7 F (36.5 C) (02/27 0413) Temp src: Oral (02/27 0413) BP: 119/76 mmHg (02/27 0413) Pulse Rate: 96  (02/27 0413)  Labs:  Alvira Philips 11/13/11 0417 11/12/11 0432 11/11/11 0450  HGB -- -- --  HCT -- -- --  PLT -- -- --  APTT -- -- --  LABPROT 24.4* 31.0* 40.7*  INR 2.15* 2.93* 4.15*  HEPARINUNFRC -- -- --  CREATININE 1.24 1.07 0.97  CKTOTAL -- -- --  CKMB -- -- --  TROPONINI -- -- --   Estimated Creatinine Clearance: 46.3 ml/min (by C-G formula based on Cr of 1.24).   Medications:  Scheduled:     . bisoprolol  5 mg Oral Daily  . budesonide  0.25 mg Nebulization BID  . cholecalciferol  1,000 Units Oral Daily  . clopidogrel  75 mg Oral Q breakfast  . digoxin  250 mcg Oral Daily  . diltiazem  240 mg Oral Daily  . feeding supplement  237 mL Oral BID  . furosemide  40 mg Oral BID  . ipratropium  500 mcg Nebulization QID  . isosorbide mononitrate  60 mg Oral Daily  . levalbuterol  0.63 mg Nebulization Q4H  . levofloxacin  750 mg Oral q1800  . morphine  2.5 mg Oral Q6H  . pantoprazole  20 mg Oral Q1200  . potassium chloride  20 mEq Oral TID  . predniSONE  40 mg Oral Q breakfast  . rosuvastatin  40 mg Oral Daily  . senna  2 tablet Oral BID  . warfarin  1 mg Oral ONCE-1800  . Warfarin - Pharmacist Dosing Inpatient   Does not apply q1800    Assessment:  83 YOM on chronic coumadin for hx Afib/ s/p St Jude single chamber PM insertion  Home dose of warfarin was 5mg  daily PTA, last dose 2/20 PTA.  Per [facility] anti-coagulation clinic notes, patient was stable on this dose since 08/2011.  INR now within therapeutic range after holding doses since admission (6 days holding warfarin).  Continues to decrease after restarting Coumadin at low dose last night, will increase dose further.  No reported  bleeding  Levaquin and Prednisone both may cause an increase in INR due to interaction with warfarin.  Of note, patient is also on Plavix.  Goal of Therapy:  INR 2-3   Plan:   Coumadin 3 mg po once today. Follow up INR in AM.  Clance Boll, PharmD Pager: [phone] 11/13/2011 10:22 AM

## 2011-11-13 NOTE — Progress Notes (Signed)
CSW reviewed chart and aware of plan for SNF and is hopeful for bed in near future. CSW will attempt to present bed offers this afternoon.   Vennie Homans, [place] 10:57 AM 11/13/2011 [phone]

## 2011-11-13 NOTE — Progress Notes (Signed)
Progress Note from the Palliative Medicine Team at [facility]  Subjective: Patient alert and oriented, co constipation,         Objective: Allergies  Allergen Reactions  . Morphine Itching   Scheduled Meds:   . bisacodyl  10 mg Rectal Once  . bisoprolol  5 mg Oral Daily  . budesonide  0.25 mg Nebulization BID  . cholecalciferol  1,000 Units Oral Daily  . clopidogrel  75 mg Oral Q breakfast  . digoxin  250 mcg Oral Daily  . diltiazem  240 mg Oral Daily  . feeding supplement  237 mL Oral BID  . furosemide  40 mg Oral BID  . ipratropium  500 mcg Nebulization QID  . isosorbide mononitrate  60 mg Oral Daily  . levalbuterol  0.63 mg Nebulization Q4H  . levofloxacin  750 mg Oral q1800  . pantoprazole  20 mg Oral Q1200  . polyethylene glycol  17 g Oral Daily  . potassium chloride  20 mEq Oral TID  . predniSONE  40 mg Oral Q breakfast  . rosuvastatin  40 mg Oral Daily  . senna  2 tablet Oral BID  . senna-docusate  2 tablet Oral QHS  . warfarin  1 mg Oral ONCE-1800  . warfarin  3 mg Oral ONCE-1800  . Warfarin - Pharmacist Dosing Inpatient   Does not apply q1800  . DISCONTD: morphine  2.5 mg Oral Q6H   Continuous Infusions:  PRN Meds:.sodium chloride, bisacodyl, levalbuterol, levalbuterol, nitroGLYCERIN, sodium phosphate, DISCONTD: oxyCODONE  BP 110/69  Pulse 70  Temp(Src) 97.5 F (36.4 C) (Oral)  Resp 18  Ht 5\' 9"  (1.753 m)  Wt 69.99 kg (154 lb 4.8 oz)  BMI 22.79 kg/m2  SpO2 95%   PPS:40%  Pain Score:denies Pain Location      LBM:per pt 4-5 days ago    Stool Softner:yes  Physical Exam:  General: frail elderly male in NAD HEENT:  unremarkable Chest:   Diminished CTA CVS: RRR Abdomen:firm, +BS non tender Ext: without edema Neuro:alert and oriented  Labs: CBC    Component Value Date/Time   WBC 18.0* 11/09/2011 0832   RBC 5.11 11/09/2011 0832   HGB 15.9 11/09/2011 0832   HCT 45.1 11/09/2011 0832   PLT 212 11/09/2011 0832   MCV 88.3 11/09/2011 0832   MCH  31.1 11/09/2011 0832   MCHC 35.3 11/09/2011 0832   RDW 13.0 11/09/2011 0832   LYMPHSABS 1.0 11/07/2011 1715   MONOABS 0.6 11/07/2011 1715   EOSABS 0.0 11/07/2011 1715   BASOSABS 0.0 11/07/2011 1715    BMET    Component Value Date/Time   NA 132* 11/13/2011 0417   K 4.4 11/13/2011 0417   CL 94* 11/13/2011 0417   CO2 29 11/13/2011 0417   GLUCOSE 152* 11/13/2011 0417   BUN 31* 11/13/2011 0417   CREATININE 1.24 11/13/2011 0417   CALCIUM 9.6 11/13/2011 0417   GFRNONAA 53* 11/13/2011 0417   GFRAA 61* 11/13/2011 0417    CMP     Component Value Date/Time   NA 132* 11/13/2011 0417   K 4.4 11/13/2011 0417   CL 94* 11/13/2011 0417   CO2 29 11/13/2011 0417   GLUCOSE 152* 11/13/2011 0417   BUN 31* 11/13/2011 0417   CREATININE 1.24 11/13/2011 0417   CALCIUM 9.6 11/13/2011 0417   PROT 6.4 11/07/2011 1715   ALBUMIN 3.9 11/07/2011 1715   AST 20 11/07/2011 1715   ALT 26 11/07/2011 1715   ALKPHOS 65 11/07/2011 1715   BILITOT  0.7 11/07/2011 1715   GFRNONAA 53* 11/13/2011 0417   GFRAA 61* 11/13/2011 0417     1  Assessment and Plan: 1. Code Status:DNR/DNI, discussed with daughter all in agreement 2. Symptom Control:constipation, ducolax now, senna qhs 3. Psycho/Social:emotional support offered to pt 4. Spiritual 5. Disposition:plan to dc to SNF for rehab, open to PCS in facility  PMT will continue to support holistically  Lorinda Creed NP

## 2011-11-13 NOTE — Progress Notes (Signed)
Physical Therapy Note  Attempted PT tx session. Pt declined due to "not feeling well/constipated". Requested assistance to [facility]. Assisted pt to [facility] with call bell within reach so he could call for assistance back to bed. Notified nurse tech.

## 2011-11-13 NOTE — Progress Notes (Addendum)
Pt complains of inability to void. No documented void since 1700. Slight bladder distention noted and pt verbalizes pressure upon bladder palpation. Bladder scan shows . Paged MD for orders. Will monitor.  @2235 : Spoke with Dr. Delton Coombes. He is to order in and out cath.

## 2011-11-13 NOTE — Progress Notes (Addendum)
Subjective: c/o class 3-4 dyspnea - but morphine helped a lot. Biggest complain this afternoon is constipation. Very agitated and uncomfortable.    Objective: Vital signs in last 24 hours: Blood pressure 119/76, pulse 89, temperature 97.7 F (36.5 C), temperature source Oral, resp. rate 16, height 5\' 9"  (1.753 m), weight 69.99 kg (154 lb 4.8 oz), SpO2 95.00%.  Intake/Output from previous day: 02/26 0701 - 02/27 0700 In: 1100 [P.O.:980; I.V.:120] Out: 2450 [Urine:2450]   Physical Exam:   wd male in nad Chest with very decreased bs, no wheezing. Labored breathing - baseline Cor with distant heart sounds, ?regular today abd benign, firm, hyperactive Alert and oriented, moves all 4.    Lab Results: No results found for this basename: WBC:3,HGB:3,HCT:3,PLT:3 in the last 72 hours BMET  Basename 11/13/11 0417 11/12/11 0432 11/11/11 0450  NA 132* 133* 133*  K 4.4 4.4 4.1  CL 94* 95* 93*  CO2 29 30 29   GLUCOSE 152* 168* 275*  BUN 31* 31* 30*  CREATININE 1.24 1.07 0.97  CALCIUM 9.6 9.0 9.0    Studies/Results: No results found.  Assessment/Plan: COPD exacerbation (11/07/2011) and Refractory dyspnea Pt is improved with steroids, abx, and BD.  But appears to have refractory class 3-4 dyspnea due to gold stage 4 copd. Tolerated test dose oral moprhine well without ADR and found relief from dyspnea. On 2/27 significant constipation and feels morphine really did not help dyspnea Plan: DC low dose scheduled mso4 due to constipation Cont current o2 and BDs snf in am  CHF (congestive heart failure) (12/26/2010) Diuresing well, and being followed by cardiology    Diabetes mellitus (12/26/2010) Blood sugars elevated due to high dose steroids.  Will change to po prednisone and follow for next 12-24hrs.  If no better, would start ssi.   Depression  - Clinically depressed for sure. Refused pscyh consult. Now not open to trying medications. Now reluctant to address this issue in front of  family. Dtr thinks SSRI or SNRI will benefit him. Dr Ladona Ridgel from palliative care addressed this on 2/26 but at this time he wants to go to SNF rehab and depending on course reconsider   Fatigue - definitely due to copd, chf, failure to thrive but also depression contributing. D/ Dr Talmage Nap informally who feels testosterone level is low and could be contributing. I d/w patient about anti-deperssants and testim gel but he is not sure. Will have to readdress at [facility]    Constipation Plan: -s/p fleets -add mirilax     11/13/2011 1:03 PM  STAFF NOTE: I, Dr Lavinia Sharps have personally reviewed patient's available data, including medical history, events of note, physical examination and test results as part of my evaluation. I have discussed with resident/NP and other care providers such as pharmacist, RN and RRT.  In addition,  I personally evaluated patient and elicited key findings of  Severe constiapation probably narc induced. Will dc morphine because now he says he did not have good relief of dysopne from it but is possible that he is more bothered by constipation. Abd is soft without tenderness though. Also, he looks more frail and fatigued. His prognosis could be in order of months. Continue DNR/DNI ( based on our discussions with him he is likely not to even want perssors or bipap)  Rest per NP/medical resident whose note is outlined above and that I agree with  Dr. Kalman Shan, M.D., [facility].C.P Pulmonary and Critical Care Medicine Staff Physician [facility] [facility] Critical [facility]  Pager: [phone], If no answer or between  15:00h - 7:00h: call [phone]  11/13/2011 1:20 PM

## 2011-11-13 NOTE — Progress Notes (Signed)
11/13/2011 Palliative Medicine Team SW 4:57 PM Follow up psychosocial visit. Pt on commode, c/o constipation, seems pretty distressed by it. Pt reports RN aware and treating it. Note CSW plan for disposition. Will try follow up with pt tomorrow.   Kennieth Francois, [place] Pager [phone]

## 2011-11-13 NOTE — Progress Notes (Signed)
eLink Physician-Brief Progress Note Patient Name: [name] DOB: 1927/11/27 MRN: [id number]  Date of Service  11/13/2011   HPI/Events of Note  Pt unable to void, bladder scan shows > 600cc   eICU Interventions  In and out cath x 1; hope we will be able to avoid foley. May need to consider contribution of his atrovent   Intervention Category Intermediate Interventions: Other:  [name] S. 11/13/2011, 10:35 PM

## 2011-11-14 LAB — URINALYSIS, ROUTINE W REFLEX MICROSCOPIC
Leukocytes, UA: NEGATIVE
Nitrite: NEGATIVE
Specific Gravity, Urine: 1.023 (ref 1.005–1.030)
Urobilinogen, UA: 0.2 mg/dL (ref 0.0–1.0)
pH: 5.5 (ref 5.0–8.0)

## 2011-11-14 LAB — PROTIME-INR
INR: 2.11 — ABNORMAL HIGH (ref 0.00–1.49)
Prothrombin Time: 24 seconds — ABNORMAL HIGH (ref 11.6–15.2)

## 2011-11-14 LAB — BASIC METABOLIC PANEL
CO2: 30 mEq/L (ref 19–32)
GFR calc non Af Amer: 42 mL/min — ABNORMAL LOW (ref >90)
Glucose, Bld: 173 mg/dL — ABNORMAL HIGH (ref 70–99)
Potassium: 4.1 mEq/L (ref 3.5–5.1)
Sodium: 133 mEq/L — ABNORMAL LOW (ref 135–145)

## 2011-11-14 LAB — DIGOXIN LEVEL: Digoxin Level: 1.4 ng/mL (ref 0.8–2.0)

## 2011-11-14 LAB — URINE MICROSCOPIC-ADD ON

## 2011-11-14 MED ORDER — DEXTROSE 5 % IV SOLN
1.0000 g | INTRAVENOUS | Status: DC
  Administered 2011-11-14 – 2011-11-15 (×2): 1 g via INTRAVENOUS
  Filled 2011-11-14 (×3): qty 10

## 2011-11-14 MED ORDER — TAMSULOSIN HCL 0.4 MG PO CAPS
0.4000 mg | ORAL_CAPSULE | Freq: Every day | ORAL | Status: DC
  Administered 2011-11-14: 0.4 mg via ORAL
  Filled 2011-11-14 (×3): qty 1

## 2011-11-14 MED ORDER — ALPRAZOLAM 0.5 MG PO TABS
0.5000 mg | ORAL_TABLET | Freq: Three times a day (TID) | ORAL | Status: DC | PRN
Start: 2011-11-14 — End: 2011-11-15

## 2011-11-14 MED ORDER — WARFARIN SODIUM 5 MG PO TABS
5.0000 mg | ORAL_TABLET | Freq: Once | ORAL | Status: AC
  Administered 2011-11-14: 5 mg via ORAL
  Filled 2011-11-14: qty 1

## 2011-11-14 MED ORDER — PREDNISONE 20 MG PO TABS
20.0000 mg | ORAL_TABLET | Freq: Every day | ORAL | Status: DC
  Administered 2011-11-15: 20 mg via ORAL
  Filled 2011-11-14 (×2): qty 1

## 2011-11-14 MED ORDER — OXYCODONE HCL 5 MG PO TABS: 5.0000 mg | ORAL_TABLET | ORAL | Status: DC | PRN

## 2011-11-14 NOTE — Progress Notes (Signed)
Subjective:  Off morphine -> constipation resolved Very fatigued and deconditoned. Unable to go to bathroom without dyspnea. Needing bedside commode. Unable to do PT Urinary retentuion x 2  Since last night -> needing foley now. Urine discolored. PSA high at 6. Noted: he is on atrovent Increasing depression:  In discussion about dc disposition place: Says " just end my life but, I know you cannot do that" and a minute later telling nurse, " I feel like slitting my throat"  Objective: Vital signs in last 24 hours: Blood pressure 168/84, pulse 79, temperature 96.9 F (36.1 C), temperature source Axillary, resp. rate 24, height 5\' 9"  (1.753 m), weight 69.99 kg (154 lb 4.8 oz), SpO2 98.00%.  Intake/Output from previous day: 02/27 0701 - 02/28 0700 In: 220.5 [P.O.:60; I.V.:160.5] Out: 1000 [Urine:1000]   ANTIBIOTICs Anti-infectives     Start     Dose/Rate Route Frequency Ordered Stop   11/10/11 1800   azithromycin (ZITHROMAX) tablet 500 mg  Status:  Discontinued        500 mg Oral Every 24 hours 11/10/11 1143 11/10/11 1358   11/10/11 1800   levofloxacin (LEVAQUIN) tablet 750 mg        750 mg Oral Daily-1800 11/10/11 1358     11/09/11 2000   vancomycin (VANCOCIN) 1,250 mg in sodium chloride 0.9 % 250 mL IVPB  Status:  Discontinued        1,250 mg 166.7 mL/hr over 90 Minutes Intravenous Every 12 hours 11/09/11 1054 11/10/11 1358   11/07/11 2000   vancomycin (VANCOCIN) 750 mg in sodium chloride 0.9 % 150 mL IVPB  Status:  Discontinued        750 mg 150 mL/hr over 60 Minutes Intravenous Every 12 hours 11/07/11 1924 11/09/11 1054   11/07/11 1800   cefTRIAXone (ROCEPHIN) 1 g in dextrose 5 % 50 mL IVPB  Status:  Discontinued        1 g 100 mL/hr over 30 Minutes Intravenous Every 24 hours 11/07/11 1636 11/10/11 1358   11/07/11 1800   azithromycin (ZITHROMAX) 500 mg in dextrose 5 % 250 mL IVPB  Status:  Discontinued        500 mg 250 mL/hr over 60 Minutes Intravenous Every 24 hours  11/07/11 1636 11/10/11 1143   11/07/11 1645   vancomycin (VANCOCIN) IVPB 1000 mg/200 mL premix  Status:  Discontinued        1,000 mg 200 mL/hr over 60 Minutes Intravenous STAT 11/07/11 1635 11/07/11 1924           Physical Exam: Gen: Very decontioned. Disheveled Psych: Depressed. Refusing psych meds and pysch eval. Flat affect. Chest with very decreased bs, no wheezing. Labored breathing - baseline Cor with distant heart sounds, ?regular today abd benign, firm, hyperactive Alert and oriented, moves all 4.   GU: foley +  Lab Results: No results found for this basename: WBC:3,HGB:3,HCT:3,PLT:3 in the last 72 hours BMET  Basename 11/14/11 0455 11/13/11 0417 11/12/11 0432  NA 133* 132* 133*  K 4.1 4.4 4.4  CL 93* 94* 95*  CO2 30 29 30   GLUCOSE 173* 152* 168*  BUN 35* 31* 31*  CREATININE 1.51* 1.24 1.07  CALCIUM 9.4 9.6 9.0    Studies/Results: No results found.  Assessment/Plan: COPD exacerbation (11/07/2011) and Refractory dyspnea Pt is improved with steroids, abx, and BD.  But appears to have refractory class 3-4 dyspnea due to gold stage 4 copd. Tolerated test dose oral moprhine well 2/26 without ADR and found relief from  dyspnea. On 2/27 significant constipation and feels morphine really did not help dyspnea so morphine stopped. On 2/28 no worsening of class 3-4 dyspnea but still present  Plan: Hold morphine for now Cont current o2 and BDs ubut dc atrovent due to urinary retention   CHF (congestive heart failure) (12/26/2010) Clinically euvolemic PLAn Cards meds      Depression  - Clinically depressed for sure. Refused pscyh consult. Now not open to trying medications. Now reluctant to address this issue in front of family. Dtr thinks SSRI or SNRI will benefit him. Dr Ladona Ridgel from palliative care addressed this on 2/26 but at this time he wants to go to SNF rehab and depending on course reconsider,.However, on 11/14/11 actively depresed and actively expressing  suicidal thougths. Refusing psych meds and psych consult PLAn  - get sitter to bedside  - d/w Palliative care Dr Phillips Odor who will evaluate  - Get psych consult (I counseled patient and strongly insisted on it, he agreed, wants it kept confidential from dtr)  - reduce prednisone dose - dc levaquin    Fatigue - definitely due to copd, chf, failure to thrive but also depression contributing and also low testosterone. On 2/27 D/w  Dr Talmage Nap informally who feels testosterone level is low and could be contributing to fatigue. . I d/w patient about testim gel but he is not sure. Will have to readdress at [facility] . curently on 2/28 not even able to participate in rehab. ECOG 4   Constipation: on 2/27. Resolved 2/28 Plan: -s/p fleets -add mirilax  Urineary retention on 2/28. Placed foley but this is miserable for him  plan  - dc foley at his request  - check urine culture - start ceftriaxone (dc levvaquin due to depression and cns interaction)  DISPO  Body mass index is 22.79 kg/(m^2). Gold stage 4 copd CLass 3-4 dyspnea with hypooxemia Systolic CHF wioth ef 35% ECOG 4  He meets hospice criteiria. On 2/26 meeting with palliative care he expressed interest to [facility] rehab but throughoput hospital stay unable to participate in PT and No motivation (? Medical illness + depression related). I am not sure that without treateing depression (for which he is unwilling) that he is rehabable. He is open to idea of comfort care which even with his depression he is appropriate in asking for. I have asked palliative care to re-address issue of hospice disposition. Patient today told me to do what is best for him  Dr. Kalman Shan, M.D., [facility].C.P Pulmonary and Critical Care Medicine Staff Physician [facility] [place] [facility]monary and Critical Care Pager: [phone], If no answer or between  15:00h - 7:00h: call [phone]  11/14/2011 8:02 AM

## 2011-11-14 NOTE — Progress Notes (Signed)
ANTICOAGULATION CONSULT NOTE - Follow Up Consult  Pharmacy Consult for warfarin Indication: hx Afib, s/p St Jude single chamber PM insertion  Vital Signs: Temp: 96.9 F (36.1 C) (02/28 0428) Temp src: Axillary (02/28 0428) BP: 168/84 mmHg (02/28 0428) Pulse Rate: 79  (02/28 0428)  Labs:  Basename 11/14/11 0455 11/13/11 0417 11/12/11 0432  HGB -- -- --  HCT -- -- --  PLT -- -- --  APTT -- -- --  LABPROT 24.0* 24.4* 31.0*  INR 2.11* 2.15* 2.93*  HEPARINUNFRC -- -- --  CREATININE 1.51* 1.24 1.07  CKTOTAL -- -- --  CKMB -- -- --  TROPONINI -- -- --   Estimated Creatinine Clearance: 38 ml/min (by C-G formula based on Cr of 1.51).   Medications:  Scheduled:     . bisacodyl  10 mg Rectal Once  . bisoprolol  5 mg Oral Daily  . budesonide  0.25 mg Nebulization BID  . cefTRIAXone (ROCEPHIN)  IV  1 g Intravenous Q24H  . clopidogrel  75 mg Oral Q breakfast  . digoxin  250 mcg Oral Daily  . diltiazem  240 mg Oral Daily  . feeding supplement  237 mL Oral BID  . furosemide  40 mg Oral BID  . isosorbide mononitrate  60 mg Oral Daily  . levalbuterol  0.63 mg Nebulization Q4H  . pantoprazole  20 mg Oral Q1200  . polyethylene glycol  17 g Oral Daily  . potassium chloride  20 mEq Oral TID  . predniSONE  20 mg Oral Q breakfast  . rosuvastatin  40 mg Oral Daily  . senna  2 tablet Oral BID  . senna-docusate  2 tablet Oral QHS  . Tamsulosin HCl  0.4 mg Oral QPC supper  . warfarin  3 mg Oral ONCE-1800  . Warfarin - Pharmacist Dosing Inpatient   Does not apply q1800  . DISCONTD: cholecalciferol  1,000 Units Oral Daily  . DISCONTD: ipratropium  500 mcg Nebulization QID  . DISCONTD: levofloxacin  750 mg Oral q1800  . DISCONTD: morphine  2.5 mg Oral Q6H  . DISCONTD: predniSONE  40 mg Oral Q breakfast    Assessment:  83 YOM on chronic coumadin for hx Afib/ s/p St Jude single chamber PM insertion  Home dose of warfarin was 5mg  daily PTA.  Per [facility]-coagulation clinic  notes, patient was stable on this dose since 08/2011.  INR now within therapeutic range after holding doses since admission (6 days holding warfarin).  Continues to decrease.  Plan was for pt to get 3 mg dose last night but pt refused all meds last night.  Anticipate that INR decrease < 2 tomorrow. Will order 5 mg dose now in hopes patient will accept.  No reported bleeding  Levaquin (d/c'd today) and Prednisone both may cause an increase in INR due to interaction with warfarin.  Of note, patient is also on Plavix.  Goal of Therapy:  INR 2-3   Plan:   Coumadin 5 mg po once today. Follow up INR in AM.  Clance Boll, PharmD Pager: [phone] 11/14/2011 10:02 AM

## 2011-11-14 NOTE — Progress Notes (Signed)
[facility] requesting information re: Pt's participation in PT.  Discussed with [facility] rep, that Pt declined PT yesterday due to not feeling well.   Jasmine December requesting CSW update her on Pt's status later today.  CSW to continue to follow.  Providence Crosby, LCSWA Clinical Social Work [phone]

## 2011-11-14 NOTE — Progress Notes (Signed)
I was in the pt's room with Dr. Marchelle Gearing. Pt declined several treatments mention by the doctor and voiced to him that he really wanted him "to just give me a shot and let me go". At the completion of his conversation with Dr. Marchelle Gearing, Pt called nurse back to the bedside and stated "I'm hurting. I just want something..... Well, I really just want to cut my throat and be done with it all." Attempted to calm patient. Dr. Marchelle Gearing heard patient's statement. I spoke with MD about the need to place pt on suicide precautions based on that comment. He stated that he thinks the patient is getting progressively more depressed and expressing more and more end of life statements. He states that he is going to make a few phone calls including psychiatry and make decisions on suicide precautions, etc. Day shift nurse Amil Amen notified of the entire conversation (with Mr. Mancia and Dr. Marchelle Gearing) and aware of the need for possible suicide precautions as well. Will also notify [employer].

## 2011-11-14 NOTE — Progress Notes (Signed)
Met with patient to discuss current thoughts, feelings, and concerns about his situation and condition. We met for 45 minutes-multiple issues came up regarding what his perception of QOL is and how it differs from his medical providers.  His goals are as follows:  1. No intention to committ suicide- "im dying anyway" -he just wants to be comfortable in the process- I think many of his statements were passive, also secondary gain for attention to his emotional and physical suffering/needs- fear and worry. Underlying pain and dyspnea driving comments.  2. High level of frustration with chronic disease for over 1 year has had significant life limitations, cannot get OOB or walk to bathroom w/o profound dyspnea, essentially bed-bound. Tired of medicine and hospitals, tired of pain and dyspnea. Emotional pain is strong as he feels he is a burden on his family and is ready to die when that time comes.  PlanRecs:  1. DNR/ Do not re-hospitalize, need full Hospice care-would qualify for residential hospice 2. Medical interventions directed at COMFORT only, no invasive tests or procedures, no foley, no blood draws, do not replace IV 3. Transfer to Pallitiative unit 4. Started oxycodone for pain 5. Alprazolam for "nerves" and air hunger 6. No sitter/No suicide precautions-not needed 7. Refuses psych consult  Patient requesting that I discuss plan of care with his daughter- I think he most concerned about how she is coping with his decisions.

## 2011-11-14 NOTE — Progress Notes (Signed)
Spoke with RN re: Pt's status.  RN unsure of d/c plan for Pt.  Per RN, Pt made suicidal statement this morning and a psych consult was ordered, along with a sitter.  Later this morning, Pt was seen by palliative MD and the sitter and suicide precautions were d/c'd.  Spoke with Pt's daughter re: bed offers.  Daughter declined the list of offers, stating that they have spoken to [facility] and have accepted their offer.  Daughter states that it's her understanding that Pt may d/c today.  CSW to continue to follow.  Providence Crosby, LCSWA Clinical Social Work [phone]

## 2011-11-14 NOTE — Progress Notes (Addendum)
Physician-Brief Progress Note Patient Name: [name] DOB: Apr 08, 1928 MRN: [id number]  Date of Service  11/14/2011   HPI/Events of Note   1. Creat increased this am to 1.5. PSA is high at 6.5. I got this result from remote location. No prostate med listed on home medication. At 11.30pm had in and out foley and drained out 900cc. Since then no output 2. Constipation resolved  eICU Interventions  1. Bladder scan and call MD with results. If residual urine + -> dc atrovent 2. Monitor constipation   Intervention Category Intermediate Interventions: Other:  Sircharles Holzheimer 11/14/2011, 6:46 AM

## 2011-11-14 NOTE — Progress Notes (Signed)
Spoke with NP, Cindee Lame, regarding Pt's d/c planned for today.  Per Cindee Lame, Pt has taken a turn for the worse and will not be able to d/c today.  Cindee Lame states that Pt has been taken off of suicide precautions and that he may be followed by Hospice, as he's not expected to live much longer.  Spoke with Pt's daughter, Mrs. Lawerance Bach.  Relayed to Mrs. Burns that Pt will not be d/c'ing today due to not doing well.  Discussed the suicide precautions and that they've been d/c'd.  Thanked Mrs. Burns for her time.  CSW to continue to follow.  Providence Crosby, LCSWA Clinical Social Work [phone]

## 2011-11-14 NOTE — Progress Notes (Signed)
11/14/2011 Palliative Medicine Team SW 10:56 AM Saw pt shortly after PMT provider; agree with assessment. Pt denied true desire to harm himself. Pt expressed feeling that medical team imposing their conception of "quality of life" instead of listening to his. Pt states pain control as primary wish at this time. CSW to manage disposition, available as needed for emotional support.   Kennieth Francois, [place] Pager [phone]

## 2011-11-15 ENCOUNTER — Encounter (HOSPITAL_COMMUNITY): Payer: Medicare Other

## 2011-11-15 DIAGNOSIS — R5381 Other malaise: Secondary | ICD-10-CM

## 2011-11-15 DIAGNOSIS — F329 Major depressive disorder, single episode, unspecified: Secondary | ICD-10-CM

## 2011-11-15 DIAGNOSIS — R627 Adult failure to thrive: Secondary | ICD-10-CM

## 2011-11-15 DIAGNOSIS — J441 Chronic obstructive pulmonary disease with (acute) exacerbation: Secondary | ICD-10-CM

## 2011-11-15 DIAGNOSIS — J9620 Acute and chronic respiratory failure, unspecified whether with hypoxia or hypercapnia: Secondary | ICD-10-CM

## 2011-11-15 DIAGNOSIS — R5383 Other fatigue: Secondary | ICD-10-CM

## 2011-11-15 LAB — URINE CULTURE: Culture: NO GROWTH

## 2011-11-15 MED ORDER — BISACODYL 10 MG RE SUPP: 10.0000 mg | Freq: Every day | RECTAL | Status: AC | PRN

## 2011-11-15 MED ORDER — TAMSULOSIN HCL 0.4 MG PO CAPS: 0.4000 mg | ORAL_CAPSULE | Freq: Every day | ORAL | Status: DC

## 2011-11-15 MED ORDER — WARFARIN SODIUM 5 MG PO TABS
5.0000 mg | ORAL_TABLET | Freq: Once | ORAL | Status: DC
  Filled 2011-11-15: qty 1

## 2011-11-15 MED ORDER — NITROGLYCERIN 0.4 MG SL SUBL: 0.4000 mg | SUBLINGUAL_TABLET | SUBLINGUAL | Status: DC | PRN

## 2011-11-15 MED ORDER — ENSURE IMMUNE HEALTH PO LIQD: 237.0000 mL | Freq: Two times a day (BID) | ORAL | Status: DC

## 2011-11-15 MED ORDER — SENNA 8.6 MG PO TABS: 2.0000 | ORAL_TABLET | Freq: Two times a day (BID) | ORAL | Status: DC

## 2011-11-15 MED ORDER — PREDNISONE 20 MG PO TABS: ORAL_TABLET | ORAL | Status: DC

## 2011-11-15 MED ORDER — PANTOPRAZOLE SODIUM 20 MG PO TBEC: 20.0000 mg | DELAYED_RELEASE_TABLET | Freq: Every day | ORAL | Status: DC

## 2011-11-15 MED ORDER — OXYCODONE HCL 5 MG PO TABS: 5.0000 mg | ORAL_TABLET | ORAL | Status: DC | PRN

## 2011-11-15 MED ORDER — LEVALBUTEROL HCL 0.63 MG/3ML IN NEBU: 0.6300 mg | INHALATION_SOLUTION | RESPIRATORY_TRACT | Status: DC | PRN

## 2011-11-15 MED ORDER — POLYETHYLENE GLYCOL 3350 17 G PO PACK: 17.0000 g | PACK | Freq: Every day | ORAL | Status: AC

## 2011-11-15 MED ORDER — ALPRAZOLAM 0.5 MG PO TABS: 0.5000 mg | ORAL_TABLET | Freq: Three times a day (TID) | ORAL | Status: AC | PRN

## 2011-11-15 NOTE — Progress Notes (Signed)
Patient has been discharged to [facility] today. Called [facility] [employer] and spoke to Nocatee to give report. Ambulatory services will be called shortly to arrange for transportation. Patient and family aware.

## 2011-11-15 NOTE — Progress Notes (Signed)
Patient cleared for discharge. Patient accepted to [facility]. Packet copied and placed in [facility]. Floor nurse requested to call [facility] themselves. Daughter notified. Patient aware.  Lyndle Pang C. Heraclio Seidman MSW, LCSW [phone]

## 2011-11-15 NOTE — Discharge Summary (Signed)
Physician Discharge Summary  Patient ID: [name] MRN: [id number] DOB/AGE: Mar 20, 1928 78 y.o.  Admit date: 11/07/2011 Discharge date: 11/15/2011  Admission Diagnoses: Dyspnea  Discharge Diagnoses:  Principal Problem:  *COPD exacerbation Active Problems:  Atrial fibrillation  CHF (congestive heart failure)  Diabetes mellitus  Abdominal aortic aneurysm  COPD, severe  Pacemaker  CAD (coronary artery disease)  Spiritual Distress  Acute on chronic combined systolic and diastolic heart failure  Acute-on-chronic respiratory failure  History of present illness Acute visit to office on 11/07/2011.  Had AECOPD acute office visit 09/02/11 to our office and Rx with pred and zpak. Then seemed to improved even including a followup visit 09/20/11. Says was doing okay but they were noticing diminished appetite and some weight loss till 10/30/11 when he developed acute worsening dyspnea, cough increase, increase sputum volume without change in sputum color which remained white. He called our office 10/31/11 and was advise doxy and pred burst but was noticing persistent class 3-4 dyspnea, fatigue, weight loss, failure to thrive. Seen by Dr Shelle Iron 11/04/11 - cxr and cbc normal and given prednisone boost again. But this has not helped. He feels fatigued and not able to ADLs, Has poor appetite and not eating much. Overall 10# weight loss in 2 weeks. Spirometry today shows worsening lung function by 200cc compared to August 2012 (Despite quitting smoking); currently in Gold stage 4 category   Hospital Course:  Acute on chronic respiratory failure d/t COPD exacerbation (11/07/2011) and Refractory dyspnea: Improved with steroids, abx, and BD. But appears to have refractory class 3-4 dyspnea due to gold stage 4 copd:  Admitted on 2/21 as described above. Therapeutic interventions included supplemental oxygen, scheduled bronchodilators, empiric antibiotics, and systemic steroids. He made some improvement but had  several intermittent set-backs during his hospitalization resulting in repeated acute exacerbations. Ultimately felt that he is close to his new baseline which is very poor. Certainly complicated by depression. He has  little reserve if any. Had significant set back on 2/27 and 2/28, seemingly Exacerbated by constipation. Looks much better after getting bowel regimen in line.  Plan:  -Cont current o2 and BDs (budesonide and xopenex) but dc'd atrovent due to urinary retention  - SNF placement  -stop rocephin (no evidence of infection)   CHF (congestive heart failure) (12/26/2010) and h/o CAD  BNP (last 3 results)  Basename 11/14/11 0455 11/13/11 0417 11/12/11 0432  PROBNP 2971.0* 3652.0* 2081.0*   Clinically euvolemic, No chest pain. No acute interventions added from a cardiac stand-point during this stay.  Plan:  Cont current rx: bisoprolol, isosorbide mononitrate, dig., CCB and plavix   Depression: Clinically depressed for sure. Refused treatment or evaluation.? If Steroids  Contributed. Certainly given his failure to thrive and daily episodes of pain and dyspnea driving this sense of hopelessness.   Plan  -cont supportive care.   Fatigue and Failure to thrive: definitely due to copd, chf, but also depression contributing and also low testosterone. On 2/27 D/w Dr Talmage Nap informally who feels testosterone level is low and could be contributing to fatigue.  Will have to readdress at [facility] .  on 2/28 not even able to participate in rehab. ECOG 4   Constipation: on 2/27. Resolved 2/28. This impacted his overall pulmonary status greatly and resulted in significant dyspnea. Resolved w/ enema.  Plan:  -cont bowel regimen  Urinary retention on 2/28.(resolved) Atrovent stopped.   Discharge Exam: BP 106/68  Pulse 52  Temp(Src) 97.3 F (36.3 C) (Oral)  Resp 18  Ht 5\' 9"  (1.753 m)  Wt 69.99 kg (154 lb 4.8 oz)  BMI 22.79 kg/m2  SpO2 95% on 2lpm NP  Gen: Very decontioned. Not in any  distress this am  Psych: Depressed. Refusing psych meds and pysch eval, but a little more interactive today  Chest with very decreased bs, faint wheezing. Labored breathing - baseline  Cor with distant heart sounds, ?regular today  abd benign, firm, hyperactive  Alert and oriented, moves all 4.  GU: foley +  Labs at discharge Lab Results  Component Value Date   CREATININE 1.51* 11/14/2011   BUN 35* 11/14/2011   NA 133* 11/14/2011   K 4.1 11/14/2011   CL 93* 11/14/2011   CO2 30 11/14/2011   Lab Results  Component Value Date   WBC 18.0* 11/09/2011   HGB 15.9 11/09/2011   HCT 45.1 11/09/2011   MCV 88.3 11/09/2011   PLT 212 11/09/2011   Lab Results  Component Value Date   ALT 26 11/07/2011   AST 20 11/07/2011   ALKPHOS 65 11/07/2011   BILITOT 0.7 11/07/2011   Lab Results  Component Value Date   INR 2.11* 11/14/2011   INR 2.15* 11/13/2011   INR 2.93* 11/12/2011    Current radiology studies No results found.  Disposition:  SNF with Hospice support.    Medication List  As of 11/15/2011  2:13 PM   STOP taking these medications         doxycycline 100 MG tablet      ipratropium 0.02 % nebulizer solution      lisinopril 2.5 MG tablet      oxycodone 5 MG capsule      rosuvastatin 20 MG tablet      sennosides-docusate sodium 8.6-50 MG tablet         TAKE these medications         ALPRAZolam 0.5 MG tablet   Commonly known as: XANAX   Take 1 tablet (0.5 mg total) by mouth 3 (three) times daily as needed for anxiety (air hunger).      bisacodyl 10 MG suppository   Commonly known as: DULCOLAX   Place 1 suppository (10 mg total) rectally daily as needed.      bisoprolol 5 MG tablet   Commonly known as: ZEBETA   Take 5 mg by mouth daily.      budesonide 0.5 MG/2ML nebulizer solution   Commonly known as: PULMICORT   Take 0.5 mg by nebulization 2 (two) times daily as needed. For breathing      clopidogrel 75 MG tablet   Commonly known as: PLAVIX   Take 1 tablet (75 mg  total) by mouth daily with breakfast.      digoxin 0.25 MG tablet   Commonly known as: LANOXIN   Take 250 mcg by mouth daily.      diltiazem 240 MG 24 hr capsule   Commonly known as: DILACOR XR   Take 1 capsule (240 mg total) by mouth daily.      feeding supplement Liqd   Take 237 mLs by mouth 2 (two) times daily at 10 AM and 5 PM.      furosemide 40 MG tablet   Commonly known as: LASIX   Take 40 mg by mouth 2 (two) times daily.      guaiFENesin 600 MG 12 hr tablet   Commonly known as: MUCINEX   Take 1,200 mg by mouth 2 (two) times daily.      isosorbide mononitrate 60  MG 24 hr tablet   Commonly known as: IMDUR   Take 60 mg by mouth daily.      levalbuterol 0.63 MG/3ML nebulizer solution   Commonly known as: XOPENEX   Take 3 mLs (0.63 mg total) by nebulization every 3 (three) hours as needed for wheezing or shortness of breath.      levalbuterol 45 MCG/ACT inhaler   Commonly known as: XOPENEX HFA   Inhale 1-2 puffs into the lungs every 4 (four) hours as needed for wheezing.      nitroGLYCERIN 0.4 MG SL tablet   Commonly known as: NITROSTAT   Place 0.4 mg under the tongue every 5 (five) minutes as needed. For heart pain      nitroGLYCERIN 0.4 MG SL tablet   Commonly known as: NITROSTAT   Place 1 tablet (0.4 mg total) under the tongue every 5 (five) minutes as needed.      oxyCODONE 5 MG immediate release tablet   Commonly known as: Oxy IR/ROXICODONE   Take 1 tablet (5 mg total) by mouth every 4 (four) hours as needed (dyspnea).      pantoprazole 20 MG tablet   Commonly known as: PROTONIX   Take 1 tablet (20 mg total) by mouth daily at 12 noon.      polyethylene glycol packet   Commonly known as: MIRALAX / GLYCOLAX   Take 17 g by mouth daily.      Potassium Gluconate 550 MG Tabs   Take 1 tablet by mouth daily.      predniSONE 20 MG tablet   Commonly known as: DELTASONE   Take 2 tabs daily for 4 days, then hold at 1 tab daily.      senna 8.6 MG Tabs    Commonly known as: SENOKOT   Take 2 tablets (17.2 mg total) by mouth 2 (two) times daily.      Tamsulosin HCl 0.4 MG Caps   Commonly known as: FLOMAX   Take 1 capsule (0.4 mg total) by mouth daily after supper.      Vitamin D3 2000 UNITS Tabs   Take by mouth daily.      warfarin 5 MG tablet   Commonly known as: COUMADIN   Take 5 mg by mouth daily.           Follow-up Information    Schedule an appointment as soon as possible for a visit with [facility], MD. (2-3 weeks)    Contact information:   [address] [place] [address] [phone]          Discharged Condition: poor: oxygen dependant (2 liters n/c) 24/7. Severe dyspnea with as little exertion as getting up to bedside chair.   Signed: BABCOCK,PETE 11/15/2011, 2:13 PM  Pt independently  seen and examined and available cxr's reviewed and I agree with above findings/ imp/ plan  Sandrea Hughs, MD Pulmonary and Critical Care Medicine [facility] Healthcare Cell [phone]

## 2011-11-15 NOTE — Progress Notes (Addendum)
Met with patient this AM. He is more dyspneic than he was yesterday but tells me he slept better and had a normal bowel movement. He was eating a regular breakfast this AM. I confirmed our goals for comfort care which were established yesterday, he is very interested in Hospice care, does not desire re-hospitalization, wants aggressive symptom management related to his pain, SOB.  Plan: Disposition is hospital prioritity currently as his respiratory status is about as good as it can be-which at baseline is very poor. I have major concerns about his ability to "rehab" and the patient agrees. He is probably at this point eligible for residential hospice but prognosis is very difficult to determine in his situation with advanced COPD as he is not in active phase of dying, but may very well (based on his goals for comfort care) have weeks to live. He is open to either [facility] or a SNF/ALF with Hospice care and wants to talk about this with his daughter. If he goes to SNF or ALF he will need MOST form to go with him stating that he wants comfort care/do not re-hospitalize. I addressed these issues with his daughter yesterday afternoon.

## 2011-11-15 NOTE — Progress Notes (Signed)
CSW met with Dr. Phillips Odor to discuss plans for Pt. Currently, it does not seem that Pt is appropriate for [facility] at this point. He was asking for extra bacon this morning and also did not want to go to [facility] as of yet. They prob would not take him at this time. CSW called SNF [facility] and they do not have a bed in long term care at this point. CSW notified daughter, whom is home sick. CSW can refax out for long term care beds, but needs daughter's permission. CSW will follow and fax out accordingly.  Vennie Homans, [place]  11/15/2011 10:51 AM [phone]

## 2011-11-15 NOTE — Progress Notes (Signed)
ANTICOAGULATION CONSULT NOTE - Follow Up Consult  Pharmacy Consult for warfarin Indication: hx Afib, s/p St Jude single chamber PM insertion  Vital Signs: Temp: 97.3 F (36.3 C) (03/01 0530) Temp src: Oral (03/01 0530) BP: 106/68 mmHg (03/01 0530) Pulse Rate: 52  (03/01 0530)  Labs:  Basename 11/14/11 0455 11/13/11 0417  HGB -- --  HCT -- --  PLT -- --  APTT -- --  LABPROT 24.0* 24.4*  INR 2.11* 2.15*  HEPARINUNFRC -- --  CREATININE 1.51* 1.24  CKTOTAL -- --  CKMB -- --  TROPONINI -- --   Estimated Creatinine Clearance: 38 ml/min (by C-G formula based on Cr of 1.51).  Medications:  Scheduled:     . bisoprolol  5 mg Oral Daily  . budesonide  0.25 mg Nebulization BID  . cefTRIAXone (ROCEPHIN)  IV  1 g Intravenous Q24H  . clopidogrel  75 mg Oral Q breakfast  . digoxin  250 mcg Oral Daily  . diltiazem  240 mg Oral Daily  . feeding supplement  237 mL Oral BID  . furosemide  40 mg Oral BID  . isosorbide mononitrate  60 mg Oral Daily  . levalbuterol  0.63 mg Nebulization Q4H  . pantoprazole  20 mg Oral Q1200  . polyethylene glycol  17 g Oral Daily  . potassium chloride  20 mEq Oral TID  . predniSONE  20 mg Oral Q breakfast  . rosuvastatin  40 mg Oral Daily  . senna  2 tablet Oral BID  . senna-docusate  2 tablet Oral QHS  . Tamsulosin HCl  0.4 mg Oral QPC supper  . warfarin  3 mg Oral ONCE-1800  . warfarin  5 mg Oral Once  . Warfarin - Pharmacist Dosing Inpatient   Does not apply q1800  . DISCONTD: cholecalciferol  1,000 Units Oral Daily    Assessment:  83 YOM on chronic coumadin for hx Afib/ s/p St Jude single chamber PM insertion  Home dose of warfarin was 5mg  daily PTA.  Per [facility] anti-coagulation clinic notes, patient was stable on this dose since 08/2011.  Warfarin was held 2/21-2/26 d/t elevated INR  INR down, therapeutic.  3mg  coumadin ordered 2/27 but pt refused. 5mg  given ~1300 2/28.   No reported bleeding (no CBC since 2/23)  Pt was on  Levaquin (d/c 2/28). May increase INR/decrease coumadin requirements  Pt on Prednisone which increases risk of GIB  Of note, patient is also on Plavix.  Goal of Therapy:  INR 2-3   Plan:   Coumadin 5 mg po once today. Follow up INR in AM. Pt is comfort care. Coumadin tx requires daily PT/INR. MD, Please consider d/c warfarin if clinically appropriate.  Gwen Her PharmD  [phone] 11/15/2011 9:07 AM

## 2011-11-15 NOTE — Progress Notes (Signed)
Notified patient preparing for discharge to [facility] this afternoon by non-emergent transport; spoke with patient's daughter [name] (h: [phone]) who stated patient is going to try rehab and will be utilizing Medicare A days initially and thus would not be requesting hospice services -daughter stated she would like Palliative Care Services to follow him if possible- educated that the facility MD would need to provide an order for [facility] and that the hospital palliative medicine Team would notify [facility]) with the request for the Palliative Care Services [facility]) Team follow up once patient at facility.[facility] and [facility]erral [facility] contact information [phone]) given to daughter for any additional questions.  Daughter voiced understanding and appreciation for information.   Patient information faxed to [facility] Referral Center with request for Palliative Care Services to follow up.   Valente David, RN 11/15/2011, 4:04 PM Palliative Medicine Team RN Liaison [phone]

## 2011-11-15 NOTE — Progress Notes (Signed)
11/15/2011 Palliative Medicine Team SW 3:17 PM Farewell social visit with pt at bedside. Chart notes plan for d/c to [facility]. No further needs.   Kennieth Francois, [place] Pager [phone]

## 2011-11-15 NOTE — Progress Notes (Signed)
Subjective: Feels a little better today. No nausea, no constipation, dyspnea controlled.    Objective: Vital signs in last 24 hours: Blood pressure 106/68, pulse 52, temperature 97.3 F (36.3 C), temperature source Oral, resp. rate 18, height 5\' 9"  (1.753 m), weight 69.99 kg (154 lb 4.8 oz), SpO2 95.00%. on 2 liters  Intake/Output from previous day: 02/28 0701 - 03/01 0700 In: 240 [P.O.:240] Out: 626 [Urine:625; Stool:1]   Physical Exam: Gen: Very decontioned. Not in any distress this am Psych: Depressed. Refusing psych meds and pysch eval, but a little more interactive today Chest with very decreased bs, faint wheezing. Labored breathing - baseline Cor with distant heart sounds, ?regular today abd benign, firm, hyperactive Alert and oriented, moves all 4.   GU: foley +  Lab Results: No results found for this basename: WBC:3,HGB:3,HCT:3,PLT:3 in the last 72 hours BMET  [facility] 11/14/11 0455 11/13/11 0417  NA 133* 132*  K 4.1 4.4  CL 93* 94*  CO2 30 29  GLUCOSE 173* 152*  BUN 35* 31*  CREATININE 1.51* 1.24  CALCIUM 9.4 9.6    Studies/Results: No results found.  Assessment/Plan: COPD exacerbation (11/07/2011) and Refractory dyspnea: Improved with steroids, abx, and BD.  But appears to have refractory class 3-4 dyspnea due to gold stage 4 copd. Little reserve if any. Had significant set back on 2/27 and 2/28, seemingly  Exacerbated by constipation. Looks much better after getting bowel regimen in line.  Plan: -Cont current o2 and BDs (budesonide and xopenex) but dc'd atrovent due to urinary retention -looking at SNF placement -stop rocephin (no evidence of infection)  CHF (congestive heart failure) (12/26/2010) and h/o CAD Clinically euvolemic Plan: Cont current rx: bisoprolol, isosorbide mononitrate, dig., CCB and plavix   Depression: Clinically depressed for sure. He does not want treatment or evaluation. Seems a little better today. ? Steroids contributing?   Plan -cont supportive care.    Fatigue: definitely due to copd, chf, failure to thrive but also depression contributing and also low testosterone. On 2/27 D/w  Dr Talmage Nap informally who feels testosterone level is low and could be contributing to fatigue. . I d/w patient about testim gel but he is not sure. Will have to readdress at [facility] . curently on 2/28 not even able to participate in rehab. ECOG 4  Constipation: on 2/27. Resolved 2/28 Plan: -s/p fleets -add mirilax  Urinary retention on 2/28.(resolved)   DISPO  Body mass index is 22.79 kg/(m^2). Gold stage 4 copd CLass 3-4 dyspnea with hypooxemia Systolic CHF wioth ef 35% ECOG 4  He meets hospice criteiria. He is open to idea of comfort care which even with his depression he is appropriate in asking for.11/15/2011 11:38 AM    Pt independently  seen and examined and available cxr's reviewed and I agree with above findings/ imp/ plan   Sandrea Hughs, MD Pulmonary and Critical Care Medicine [facility] Healthcare Cell [phone]

## 2011-11-18 ENCOUNTER — Encounter (HOSPITAL_COMMUNITY): Payer: Medicare Other

## 2011-11-20 ENCOUNTER — Encounter (HOSPITAL_COMMUNITY): Payer: Medicare Other

## 2011-11-22 ENCOUNTER — Encounter (HOSPITAL_COMMUNITY): Payer: Medicare Other

## 2011-11-22 NOTE — Progress Notes (Addendum)
Cardiac Rehabilitation Program Progress Report   Orientation:  09/26/2011 Graduate Date:   Discharge Date:  10/28/2011 # of sessions completed: 10  Cardiologist: Andi Hence MD:  Leona Singleton Time:  1315  A.  Exercise Program:  Exercise limited by dyspnea, Exercise limited by musculoskeletal problems, Poor attendance due to illness and Discharged  B.  Mental Health:    C.  Education/Instruction/Skills  Uses Perceived Exertion Scale and/or Dyspnea Scale and Attended 2 education classes    D.  Nutrition/Weight Control/Body Composition:  Patient has gained 0.2 kg  *This section completed by Mickle Plumb, Andres Shad, RD, LDN, CDE  E.  Blood Lipids    Lab Results  Component Value Date   CHOL 105 10/11/2011     Lab Results  Component Value Date   TRIG 149.0 10/11/2011     Lab Results  Component Value Date   HDL 36.00* 10/11/2011     Lab Results  Component Value Date   CHOLHDL 3 10/11/2011     No results found for this basename: LDLDIRECT      F.  Lifestyle Changes:    G.  Symptoms noted with exercise:  Shortness of breath, Musculoskeletal problems and Fatigue  Report Completed By:  Dayton Martes   Comments: Electronically signed by Harriett Sine MS on Friday November 22 2011 at 1444   Pt discharged from cardiac rehab program due to extended absence for hospitalization.  Pt attended 10/36 sessions and attended 3 education classes.  Pt had good participation with exercise sessions however acitivity was limited by back pain.  Pt was often tachycardic, workloads decreased for high heart rate.  Pt often hypotensive post exercise, lowest BP-88/56 pt asymptomatic, gatorade given. Pt O2 saturations stable-97-100%.  Pt v paced with afib underlying, PVC with couplets at times.  Pt was a pleasure to work with, thank you for the referral.

## 2011-11-25 ENCOUNTER — Encounter (HOSPITAL_COMMUNITY): Payer: Medicare Other

## 2011-11-27 ENCOUNTER — Encounter (HOSPITAL_COMMUNITY): Payer: Medicare Other

## 2011-11-29 ENCOUNTER — Encounter (HOSPITAL_COMMUNITY): Payer: Medicare Other

## 2011-12-02 ENCOUNTER — Encounter (HOSPITAL_COMMUNITY): Payer: Medicare Other

## 2011-12-04 ENCOUNTER — Encounter (HOSPITAL_COMMUNITY): Payer: Medicare Other

## 2011-12-05 NOTE — Progress Notes (Signed)
Addendum to Nutrition Section of Cardiac Rehab Program Progress Report  Pt following step 1of the Therapeutic Lifestyle Changes diet. Pt wt goal of wt maintenance achieved.

## 2011-12-06 ENCOUNTER — Encounter (HOSPITAL_COMMUNITY): Payer: Medicare Other

## 2011-12-09 ENCOUNTER — Encounter (HOSPITAL_COMMUNITY): Payer: Medicare Other

## 2011-12-10 ENCOUNTER — Ambulatory Visit (INDEPENDENT_AMBULATORY_CARE_PROVIDER_SITE_OTHER): Payer: Medicare Other | Admitting: Pulmonary Disease

## 2011-12-10 ENCOUNTER — Telehealth: Payer: Self-pay | Admitting: Internal Medicine

## 2011-12-10 ENCOUNTER — Encounter: Payer: Self-pay | Admitting: Pulmonary Disease

## 2011-12-10 DIAGNOSIS — J9620 Acute and chronic respiratory failure, unspecified whether with hypoxia or hypercapnia: Secondary | ICD-10-CM

## 2011-12-10 DIAGNOSIS — J449 Chronic obstructive pulmonary disease, unspecified: Secondary | ICD-10-CM

## 2011-12-10 NOTE — Telephone Encounter (Signed)
Order placed for hospital bed and a detailed msg was left for daughter on her voice mail. PCC's will send [facility] all the information they need regarding this.

## 2011-12-10 NOTE — Telephone Encounter (Signed)
Yes please order hospital bed

## 2011-12-10 NOTE — Patient Instructions (Signed)
Decrease prednisone to 10mg  each day Your neb medications are to be:   xopenex 0.63 by neb 4 times a day no matter what.  Can have additional 2 treatments (every 4 hrs) if needed.      Budesonide 0.5mg  2 times a day no matter what. Make sure you rinse mouth well and gargle after using neb treatments. followup with Dr. Marchelle Gearing in 8 weeks, but call if having issues with tolerance.

## 2011-12-10 NOTE — Assessment & Plan Note (Signed)
The patient is status post recent hospitalization for COPD exacerbation.  He feels that he has returned near his usual baseline with respect to his breathing, but remains very weak.  He has only been receiving his nebulizer treatments as needed, and we'll therefore change this to a scheduled regimen.  Will also decrease his prednisone to 10 mg a day, and leave him there until he can followup with his primary pulmonologist.

## 2011-12-10 NOTE — Progress Notes (Signed)
  Subjective:    Patient ID: [name], male    DOB: 09-Jun-1928, 78 y.o.   MRN: [id number]  HPI The patient comes in today for followup after a recent hospitalization for COPD exacerbation.  He feels that overall he is doing well, and that his dyspnea on exertion is near his usual baseline.  He denies any significant cough or mucus production.  He does have some throat fullness that feels like something is stuck there, and I wonder if it may be related to thrush.  His oxygen saturations today in the office are adequate.  It came to my attention that he is receiving his nebulizer treatments only as needed, rather than on a scheduled basis.   Review of Systems  Constitutional: Negative for fever and unexpected weight change.  HENT: Positive for congestion and sore throat. Negative for ear pain, nosebleeds, rhinorrhea, sneezing, trouble swallowing, dental problem, postnasal drip and sinus pressure.   Eyes: Negative for redness and itching.  Respiratory: Positive for cough, chest tightness and shortness of breath. Negative for wheezing.   Cardiovascular: Negative for palpitations and leg swelling.  Gastrointestinal: Negative for nausea and vomiting.  Genitourinary: Negative for dysuria.  Musculoskeletal: Negative for joint swelling.  Skin: Negative for rash.  Neurological: Negative for headaches.  Hematological: Bruises/bleeds easily.  Psychiatric/Behavioral: Negative for dysphoric mood. The patient is not nervous/anxious.        Objective:   Physical Exam Well-developed male in no acute distress Nose without discharge or purulence noted Oropharynx without thrush or exudates noted Chest with decreased breath sounds, but adequate air flow.  No wheezes or rhonchi Cardiac exam with minimal tachycardia but regular Lower extremities with no significant edema, no cyanosis noted Alert and oriented, moves all 4 extremities.       Assessment & Plan:

## 2011-12-10 NOTE — Telephone Encounter (Signed)
Called, spoke with pt's daughter, Ms. Lawerance Bach.  Reports pt is currently in rehab facility.  Breathing is better with the hospital bed there as pt can have an incline.  Pt is supposed to be discharged next week -- requesting order for hospital bed for pt at home.  Order  will need to be sent to above fax number per Ms. Burns request.  MR, are you ok with ordering hospital bed for pt?

## 2011-12-11 ENCOUNTER — Encounter (HOSPITAL_COMMUNITY): Payer: Medicare Other

## 2011-12-13 ENCOUNTER — Encounter (HOSPITAL_COMMUNITY): Payer: Medicare Other

## 2011-12-16 ENCOUNTER — Encounter (HOSPITAL_COMMUNITY): Payer: Medicare Other

## 2011-12-18 ENCOUNTER — Encounter (HOSPITAL_COMMUNITY): Payer: Medicare Other

## 2011-12-20 ENCOUNTER — Encounter (HOSPITAL_COMMUNITY): Payer: Medicare Other

## 2011-12-23 ENCOUNTER — Encounter (HOSPITAL_COMMUNITY): Payer: Medicare Other

## 2011-12-25 ENCOUNTER — Encounter (HOSPITAL_COMMUNITY): Payer: Medicare Other

## 2011-12-26 ENCOUNTER — Ambulatory Visit: Payer: Self-pay | Admitting: Internal Medicine

## 2011-12-26 DIAGNOSIS — I4891 Unspecified atrial fibrillation: Secondary | ICD-10-CM

## 2011-12-27 ENCOUNTER — Telehealth: Payer: Self-pay | Admitting: Internal Medicine

## 2011-12-27 ENCOUNTER — Encounter (HOSPITAL_COMMUNITY): Payer: Medicare Other

## 2011-12-27 NOTE — Telephone Encounter (Signed)
Called and spoke with leisha from [facility] and she wanted to see if MR would sign the face to face form that needs to be signed for the home care.  leisha stated that the only thing that needs to be on the form was the last ov with MR, why the pt is homebound and needs to be signed by the MD.  MR  Are you ok to sign this form for the pt.  Please advise.  thanks

## 2011-12-27 NOTE — Telephone Encounter (Signed)
yes

## 2011-12-27 NOTE — Telephone Encounter (Signed)
Called and spoke with leisha and she is aware that per MR he is willing to fill out the face to face form for the pt.  She will fax this over to the triage fax.

## 2011-12-27 NOTE — Telephone Encounter (Signed)
This encounter was created in error - please disregard.

## 2012-01-02 ENCOUNTER — Ambulatory Visit: Payer: Self-pay | Admitting: Cardiovascular Disease

## 2012-01-02 DIAGNOSIS — I4891 Unspecified atrial fibrillation: Secondary | ICD-10-CM

## 2012-01-02 LAB — POCT INR: INR: 1.8

## 2012-01-06 ENCOUNTER — Ambulatory Visit (HOSPITAL_COMMUNITY): Payer: Medicare Other

## 2012-01-08 ENCOUNTER — Ambulatory Visit (HOSPITAL_COMMUNITY): Payer: Medicare Other

## 2012-01-09 ENCOUNTER — Encounter: Payer: Medicare Other | Admitting: *Deleted

## 2012-01-09 ENCOUNTER — Ambulatory Visit: Payer: Self-pay | Admitting: Cardiovascular Disease

## 2012-01-09 DIAGNOSIS — I4891 Unspecified atrial fibrillation: Secondary | ICD-10-CM

## 2012-01-09 LAB — POCT INR: INR: 2

## 2012-01-10 ENCOUNTER — Ambulatory Visit (HOSPITAL_COMMUNITY): Payer: Medicare Other

## 2012-01-13 ENCOUNTER — Ambulatory Visit (HOSPITAL_COMMUNITY): Payer: Medicare Other

## 2012-01-15 ENCOUNTER — Ambulatory Visit (HOSPITAL_COMMUNITY): Payer: Medicare Other

## 2012-01-16 ENCOUNTER — Encounter: Payer: Self-pay | Admitting: *Deleted

## 2012-01-17 ENCOUNTER — Ambulatory Visit (HOSPITAL_COMMUNITY): Payer: Medicare Other

## 2012-01-20 ENCOUNTER — Ambulatory Visit (HOSPITAL_COMMUNITY): Payer: Medicare Other

## 2012-01-20 ENCOUNTER — Ambulatory Visit: Payer: Self-pay | Admitting: Cardiology

## 2012-01-20 DIAGNOSIS — I4891 Unspecified atrial fibrillation: Secondary | ICD-10-CM

## 2012-01-22 ENCOUNTER — Ambulatory Visit (HOSPITAL_COMMUNITY): Payer: Medicare Other

## 2012-01-24 ENCOUNTER — Ambulatory Visit (HOSPITAL_COMMUNITY): Payer: Medicare Other

## 2012-01-27 ENCOUNTER — Ambulatory Visit (HOSPITAL_COMMUNITY): Payer: Medicare Other

## 2012-01-29 ENCOUNTER — Ambulatory Visit (HOSPITAL_COMMUNITY): Payer: Medicare Other

## 2012-01-30 ENCOUNTER — Ambulatory Visit (INDEPENDENT_AMBULATORY_CARE_PROVIDER_SITE_OTHER): Payer: Medicare Other | Admitting: *Deleted

## 2012-01-30 DIAGNOSIS — I4891 Unspecified atrial fibrillation: Secondary | ICD-10-CM

## 2012-01-30 LAB — POCT INR: INR: 1.9

## 2012-01-31 ENCOUNTER — Ambulatory Visit (HOSPITAL_COMMUNITY): Payer: Medicare Other

## 2012-02-04 ENCOUNTER — Ambulatory Visit (INDEPENDENT_AMBULATORY_CARE_PROVIDER_SITE_OTHER): Payer: Medicare Other | Admitting: Internal Medicine

## 2012-02-04 VITALS — BP 118/62 | HR 64 | Temp 98.0°F | Ht 69.0 in | Wt 175.6 lb

## 2012-02-04 DIAGNOSIS — J449 Chronic obstructive pulmonary disease, unspecified: Secondary | ICD-10-CM

## 2012-02-04 NOTE — Patient Instructions (Signed)
#  COPD   - glad you are doing better  - continue the work with exercise and I see you are improving well; keep up the good work  - respect your decision to do rehab by yourself at home instead of at [facility] - continue your copd medications  #DRiving  - as long as cognition, eyes, and joints and muscles are okay you can drive a car  #Followup  - 6 months or sooner if needed - have flu shot in the fall  - CAT score at followup

## 2012-02-04 NOTE — Assessment & Plan Note (Signed)
Suprisingly lower level of CAT score in setting of recurrent ae copd and stage 3-4 copd. This might imply better prognosis. HE has purpose nowl wants to drive car  PLAN #COPD   - glad you are doing better  - continue the work with exercise and I see you are improving well; keep up the good work  - respect your decision to do rehab by yourself at home instead of at [facility] - continue your copd medications  #DRiving  - as long as cognition, eyes, and joints and muscles are okay you can drive a car  #Followup  - 6 months or sooner if needed - have flu shot in the fall  - CAT score at followup

## 2012-02-04 NOTE — Progress Notes (Signed)
Subjective:    Patient ID: [name], male    DOB: 16-Sep-1928, 78 y.o.   MRN: [id number]  HPI #COPD  PFTs 11/21/2010  - spirometry only: Fev1 0.84L/31%, Ratio 45. Gold stage 3-4 COPD Spiro 04/26/2011 - fev1 1.06L/34% CAT score 17 - 02/04/2012     OV 02/04/2012  [place] Gold stage 3 copd. Since hospitalization several months ago for AECOPD and complicated grief/depression (refused to see psych or try psych meds)  He has continued to improve. No longer at [facility]. Now at [facility]. Getting PT there. Still does not fee baseline. Says when old easy to break but tough to build back.. Does not want to go to pulm rehab at [facility]; opts to self exercise at home. He seems to have gained purpose. Denies depression or grief over wife. Feels motivated to go driving car again. Using walker but is otherwise doing all things by himself. CAT symptom score is 17    CAT COPD Symptom and Quality of Life Score ([employer])  0 (no burden) to 5 (highest burden)  Never Cough -> Cough all the time 2  No phlegm in chest -> Chest is full of phlegm 2  No chest tightness -> Chest feels very tight 1  No dyspnea for 1 flight stairs/hill -> Very dyspneic for 1 flight of stairs 4  No limitations for ADL at home -> Very limited with ADL at home 3  Confident leaving home -> Not at all confident leaving home 0  Sleep soundly -> Do not sleep soundly because of lung condition 2  Lots of Energy -> No energy at all 3  TOTAL Score (max 40)  17        Current outpatient prescriptions:acetaminophen (TYLENOL) 325 MG tablet, Take 650 mg by mouth every 4 (four) hours as needed. Prior to oxycodone if needed, Disp: , Rfl: ;  bisacodyl (DULCOLAX) 10 MG suppository, Place 10 mg rectally as needed., Disp: , Rfl: ;  bisoprolol (ZEBETA) 5 MG tablet, Take 5 mg by mouth daily.  , Disp: , Rfl:  budesonide (PULMICORT) 0.5 MG/2ML nebulizer solution, Take 0.5 mg by nebulization 2 (two) times  daily as needed. For breathing, Disp: , Rfl: ;  Cholecalciferol (VITAMIN D3) 2000 UNITS TABS, Take by mouth daily. , Disp: , Rfl: ;  clopidogrel (PLAVIX) 75 MG tablet, Take 1 tablet (75 mg total) by mouth daily with breakfast., Disp: 30 tablet, Rfl: 6;  digoxin (LANOXIN) 0.25 MG tablet, Take 250 mcg by mouth daily.  , Disp: , Rfl:  diltiazem (DILACOR XR) 240 MG 24 hr capsule, Take 1 capsule (240 mg total) by mouth daily., Disp: , Rfl: ;  furosemide (LASIX) 40 MG tablet, Take 40 mg by mouth 2 (two) times daily.  , Disp: , Rfl: ;  guaiFENesin (MUCINEX) 600 MG 12 hr tablet, Take 1,200 mg by mouth 2 (two) times daily., Disp: , Rfl: ;  isosorbide mononitrate (IMDUR) 60 MG 24 hr tablet, Take 60 mg by mouth daily.  , Disp: , Rfl:  levalbuterol (XOPENEX HFA) 45 MCG/ACT inhaler, Inhale 1-2 puffs into the lungs every 4 (four) hours as needed for wheezing., Disp: 1 Inhaler, Rfl: 12;  levalbuterol (XOPENEX) 0.63 MG/3ML nebulizer solution, Take 3 mLs (0.63 mg total) by nebulization every 3 (three) hours as needed for wheezing or shortness of breath., Disp: 3 mL, Rfl: 0 nitroGLYCERIN (NITROSTAT) 0.4 MG SL tablet, Place 1 tablet (0.4 mg total) under the tongue every 5 (five) minutes  as needed., Disp: 25 tablet, Rfl: 3;  polyethylene glycol (MIRALAX / GLYCOLAX) packet, Take 17 g by mouth daily., Disp: , Rfl: ;  Potassium Gluconate 550 MG TABS, Take 1 tablet by mouth 3 (three) times daily. , Disp: , Rfl: ;  senna (SENOKOT) 8.6 MG TABS, Take 2 tablets (17.2 mg total) by mouth 2 (two) times daily., Disp: 120 each, Rfl:  Tamsulosin HCl (FLOMAX) 0.4 MG CAPS, Take 1 capsule (0.4 mg total) by mouth daily after supper., Disp: 30 capsule, Rfl: 0;  warfarin (COUMADIN) 3 MG tablet, Take 3 mg by mouth daily., Disp: , Rfl: ;  omeprazole (PRILOSEC) 20 MG capsule, Take 20 mg by mouth daily., Disp: , Rfl: ;  oxyCODONE (OXY IR/ROXICODONE) 5 MG immediate release tablet, Take 1 tablet (5 mg total) by mouth every 4 (four) hours as needed  (dyspnea)., Disp: 30 tablet, Rfl: 0 predniSONE (DELTASONE) 20 MG tablet, Take 20 mg by mouth daily., Disp: , Rfl: ;  DISCONTD: pantoprazole (PROTONIX) 20 MG tablet, Take 1 tablet (20 mg total) by mouth daily at 12 noon., Disp: 30 tablet, Rfl: 0   Past, Family, Social reviewed: no change since last visit   Review of Systems  Constitutional: Positive for fatigue. Negative for fever, chills, diaphoresis, activity change, appetite change and unexpected weight change.  HENT: Negative.   Eyes: Negative.   Respiratory: Positive for cough, chest tightness and shortness of breath.   Cardiovascular: Negative.   Gastrointestinal: Negative.   Genitourinary: Positive for difficulty urinating. Negative for dysuria, urgency, frequency, hematuria, flank pain, decreased urine volume, discharge, penile swelling, scrotal swelling, enuresis, genital sores, penile pain and testicular pain.  Musculoskeletal: Positive for gait problem. Negative for myalgias, back pain, joint swelling and arthralgias.  Skin: Negative.   Neurological: Negative.   Hematological: Negative.   Psychiatric/Behavioral: Negative.  Negative for suicidal ideas and agitation.       Objective:   Physical Exam  Nursing note and vitals reviewed. Constitutional: He is oriented to person, place, and time. He appears well-developed and well-nourished. No distress.       More conditioned  HENT:  Head: Normocephalic and atraumatic.  Right Ear: External ear normal.  Left Ear: External ear normal.  Mouth/Throat: Oropharynx is clear and moist. No oropharyngeal exudate.  Eyes: Conjunctivae and EOM are normal. Pupils are equal, round, and reactive to light. Right eye exhibits no discharge. Left eye exhibits no discharge. No scleral icterus.  Neck: Normal range of motion. Neck supple. No JVD present. No tracheal deviation present. No thyromegaly present.  Cardiovascular: Normal rate, regular rhythm and intact distal pulses.  Exam reveals no  gallop and no friction rub.   No murmur heard. Pulmonary/Chest: Effort normal and breath sounds normal. No respiratory distress. He has no wheezes. He has no rales. He exhibits no tenderness.  Abdominal: Soft. Bowel sounds are normal. He exhibits no distension and no mass. There is no tenderness. There is no rebound and no guarding.  Musculoskeletal: Normal range of motion. He exhibits no edema and no tenderness.       Uses walker  Lymphadenopathy:    He has no cervical adenopathy.  Neurological: He is alert and oriented to person, place, and time. He has normal reflexes. No cranial nerve deficit. Coordination normal.  Skin: Skin is warm and dry. No rash noted. He is not diaphoretic. No erythema. No pallor.  Psychiatric: He has a normal mood and affect. His behavior is normal. Judgment and thought content normal.  Assessment & Plan:

## 2012-02-06 ENCOUNTER — Ambulatory Visit (INDEPENDENT_AMBULATORY_CARE_PROVIDER_SITE_OTHER): Payer: Medicare Other | Admitting: Pharmacist

## 2012-02-06 DIAGNOSIS — I4891 Unspecified atrial fibrillation: Secondary | ICD-10-CM

## 2012-02-06 LAB — POCT INR: INR: 1.7

## 2012-02-20 ENCOUNTER — Ambulatory Visit (INDEPENDENT_AMBULATORY_CARE_PROVIDER_SITE_OTHER): Payer: Medicare Other | Admitting: *Deleted

## 2012-02-20 DIAGNOSIS — I4891 Unspecified atrial fibrillation: Secondary | ICD-10-CM

## 2012-02-20 LAB — POCT INR: INR: 2.3

## 2012-03-05 ENCOUNTER — Encounter: Payer: Self-pay | Admitting: Emergency Medicine

## 2012-03-05 ENCOUNTER — Ambulatory Visit (INDEPENDENT_AMBULATORY_CARE_PROVIDER_SITE_OTHER): Payer: Medicare Other | Admitting: Emergency Medicine

## 2012-03-05 ENCOUNTER — Telehealth: Payer: Self-pay | Admitting: Internal Medicine

## 2012-03-05 VITALS — BP 120/62 | HR 86 | Temp 97.9°F | Ht 69.0 in | Wt 172.6 lb

## 2012-03-05 DIAGNOSIS — J441 Chronic obstructive pulmonary disease with (acute) exacerbation: Secondary | ICD-10-CM

## 2012-03-05 MED ORDER — PREDNISONE 10 MG PO TABS: ORAL_TABLET | ORAL | Status: DC

## 2012-03-05 NOTE — Progress Notes (Signed)
  Subjective:    Patient ID: [name], male    DOB: 1928/05/25, 78 y.o.   MRN: [id number] HPI Problem list 1. 75 pack smoking hx. Quit 2010. Known CAD 2. COPD - Gold stage 3-18 November 2010 spirometry, Did not desaturate May 2012 185 feet x 3 laps. On atrovent, pulmicort nebs 3. AECOPD hx: March 2012 (LLL PNA), April 2012 - clear cxr  Acute Visit 03/05/12 -- Hx of Severe COPD (FEV1 0.84L), presents today complaining of new onset indigestion, malaise. ? Sick contact. Then developed chest congestion and some increasing dyspnea. Hasn't started mucinex yet.     Objective:  Gen: Pleasant, elderly with cane  ENT: No lesions,  mouth clear,  oropharynx clear, no postnasal drip  Neck: No JVD, no TMG, no carotid bruits  Lungs: No use of accessory muscles, very distant, slight exp wheeze.  Cardiovascular: RRR, heart sounds normal, no murmur or gallops, no peripheral edema  Musculoskeletal: No deformities, no cyanosis or clubbing  Neuro: alert, non focal  Skin: Warm, no lesions or rashes  Assessment & Plan:   COPD exacerbation Probably early exacerbation - rx with pred taper, defer abx at this time - same BD's - OV w MR next available

## 2012-03-05 NOTE — Patient Instructions (Addendum)
Please continue your inhaled medications as you are taking them Take prednisone taper as directed Follow with Dr Marchelle Gearing at next available opening

## 2012-03-05 NOTE — Assessment & Plan Note (Signed)
Probably early exacerbation - rx with pred taper, defer abx at this time - same BD's - OV w MR next available

## 2012-03-05 NOTE — Telephone Encounter (Signed)
I spoke with daughter and she states pt is having increase SOB, cough and lots of congestion, RB had an opening today at 4:00 and pt is coming in at that time to be seen. Nothing further was needed

## 2012-03-09 ENCOUNTER — Telehealth: Payer: Self-pay | Admitting: Internal Medicine

## 2012-03-09 MED ORDER — LEVOFLOXACIN 500 MG PO TABS: 500.0000 mg | ORAL_TABLET | Freq: Every day | ORAL | Status: DC

## 2012-03-09 NOTE — Telephone Encounter (Signed)
Got call from patient  Yesterday but I got back to him today. He said today he is better. Lst night lot of cough and colored sputum with dyspnea. Today much better. Noted he is only on pred taper from 6/18.   Triage: please call him  take levaquin 500mg  once daily  X 6 days to [facility] on [address] past [address]

## 2012-03-09 NOTE — Telephone Encounter (Signed)
Pt aware. rx has been sent to the pharmacy 

## 2012-03-10 ENCOUNTER — Inpatient Hospital Stay (HOSPITAL_COMMUNITY)
Admission: EM | Admit: 2012-03-10 | Discharge: 2012-03-19 | DRG: 190 | Disposition: A | Discharge status: Skilled Nursing Facility | Payer: Medicare Other | Attending: Cardiology | Admitting: Cardiology

## 2012-03-10 ENCOUNTER — Other Ambulatory Visit: Payer: Self-pay

## 2012-03-10 ENCOUNTER — Encounter (HOSPITAL_COMMUNITY): Payer: Self-pay | Admitting: Emergency Medicine

## 2012-03-10 ENCOUNTER — Emergency Department (HOSPITAL_COMMUNITY): Payer: Medicare Other

## 2012-03-10 DIAGNOSIS — J449 Chronic obstructive pulmonary disease, unspecified: Secondary | ICD-10-CM

## 2012-03-10 DIAGNOSIS — M1990 Unspecified osteoarthritis, unspecified site: Secondary | ICD-10-CM | POA: Diagnosis present

## 2012-03-10 DIAGNOSIS — F329 Major depressive disorder, single episode, unspecified: Secondary | ICD-10-CM

## 2012-03-10 DIAGNOSIS — J40 Bronchitis, not specified as acute or chronic: Secondary | ICD-10-CM

## 2012-03-10 DIAGNOSIS — I5043 Acute on chronic combined systolic (congestive) and diastolic (congestive) heart failure: Secondary | ICD-10-CM

## 2012-03-10 DIAGNOSIS — I2510 Atherosclerotic heart disease of native coronary artery without angina pectoris: Secondary | ICD-10-CM

## 2012-03-10 DIAGNOSIS — R079 Chest pain, unspecified: Secondary | ICD-10-CM

## 2012-03-10 DIAGNOSIS — I4891 Unspecified atrial fibrillation: Secondary | ICD-10-CM

## 2012-03-10 DIAGNOSIS — I214 Non-ST elevation (NSTEMI) myocardial infarction: Secondary | ICD-10-CM

## 2012-03-10 DIAGNOSIS — J441 Chronic obstructive pulmonary disease with (acute) exacerbation: Principal | ICD-10-CM

## 2012-03-10 DIAGNOSIS — R5381 Other malaise: Secondary | ICD-10-CM | POA: Diagnosis present

## 2012-03-10 DIAGNOSIS — R0989 Other specified symptoms and signs involving the circulatory and respiratory systems: Secondary | ICD-10-CM

## 2012-03-10 DIAGNOSIS — Z87891 Personal history of nicotine dependence: Secondary | ICD-10-CM

## 2012-03-10 DIAGNOSIS — I4821 Permanent atrial fibrillation: Secondary | ICD-10-CM

## 2012-03-10 DIAGNOSIS — I200 Unstable angina: Secondary | ICD-10-CM

## 2012-03-10 DIAGNOSIS — J811 Chronic pulmonary edema: Secondary | ICD-10-CM

## 2012-03-10 DIAGNOSIS — R0902 Hypoxemia: Secondary | ICD-10-CM

## 2012-03-10 DIAGNOSIS — I252 Old myocardial infarction: Secondary | ICD-10-CM

## 2012-03-10 DIAGNOSIS — I509 Heart failure, unspecified: Secondary | ICD-10-CM

## 2012-03-10 DIAGNOSIS — R0600 Dyspnea, unspecified: Secondary | ICD-10-CM

## 2012-03-10 DIAGNOSIS — J9620 Acute and chronic respiratory failure, unspecified whether with hypoxia or hypercapnia: Secondary | ICD-10-CM

## 2012-03-10 DIAGNOSIS — E785 Hyperlipidemia, unspecified: Secondary | ICD-10-CM

## 2012-03-10 DIAGNOSIS — I10 Essential (primary) hypertension: Secondary | ICD-10-CM | POA: Diagnosis present

## 2012-03-10 DIAGNOSIS — E119 Type 2 diabetes mellitus without complications: Secondary | ICD-10-CM

## 2012-03-10 DIAGNOSIS — F32A Depression, unspecified: Secondary | ICD-10-CM

## 2012-03-10 DIAGNOSIS — I739 Peripheral vascular disease, unspecified: Secondary | ICD-10-CM | POA: Diagnosis present

## 2012-03-10 DIAGNOSIS — K219 Gastro-esophageal reflux disease without esophagitis: Secondary | ICD-10-CM | POA: Diagnosis present

## 2012-03-10 LAB — BASIC METABOLIC PANEL
BUN: 22 mg/dL (ref 6–23)
CO2: 27 mEq/L (ref 19–32)
Calcium: 9.9 mg/dL (ref 8.4–10.5)
Chloride: 99 mEq/L (ref 96–112)
Creatinine, Ser: 0.8 mg/dL (ref 0.50–1.35)
GFR calc Af Amer: >90 mL/min (ref >90)
GFR calc non Af Amer: 82 mL/min — ABNORMAL LOW (ref >90)
Glucose, Bld: 119 mg/dL — ABNORMAL HIGH (ref 70–99)
Potassium: 3.9 mEq/L (ref 3.5–5.1)
Sodium: 137 mEq/L (ref 135–145)

## 2012-03-10 LAB — COMPREHENSIVE METABOLIC PANEL
AST: 18 U/L (ref 0–37)
Albumin: 3.8 g/dL (ref 3.5–5.2)
Alkaline Phosphatase: 62 U/L (ref 39–117)
BUN: 21 mg/dL (ref 6–23)
CO2: 27 mEq/L (ref 19–32)
Chloride: 101 mEq/L (ref 96–112)
GFR calc non Af Amer: 81 mL/min — ABNORMAL LOW (ref >90)
Potassium: 3.8 mEq/L (ref 3.5–5.1)
Total Bilirubin: 0.4 mg/dL (ref 0.3–1.2)

## 2012-03-10 LAB — CBC
HCT: 49 % (ref 39.0–52.0)
Hemoglobin: 16.7 g/dL (ref 13.0–17.0)
MCH: 31.4 pg (ref 26.0–34.0)
MCHC: 34.1 g/dL (ref 30.0–36.0)
MCV: 92.1 fL (ref 78.0–100.0)
Platelets: 184 10*3/uL (ref 150–400)
RBC: 5.32 MIL/uL (ref 4.22–5.81)
RDW: 13.2 % (ref 11.5–15.5)
WBC: 10 10*3/uL (ref 4.0–10.5)

## 2012-03-10 LAB — URINALYSIS, ROUTINE W REFLEX MICROSCOPIC
Bilirubin Urine: NEGATIVE
Glucose, UA: NEGATIVE mg/dL
Hgb urine dipstick: NEGATIVE
Protein, ur: 100 mg/dL — AB
Urobilinogen, UA: 0.2 mg/dL (ref 0.0–1.0)

## 2012-03-10 LAB — PROTIME-INR
INR: 2.01 — ABNORMAL HIGH (ref 0.00–1.49)
Prothrombin Time: 23.1 seconds — ABNORMAL HIGH (ref 11.6–15.2)

## 2012-03-10 LAB — TSH: TSH: 0.616 u[IU]/mL (ref 0.350–4.500)

## 2012-03-10 LAB — CARDIAC PANEL(CRET KIN+CKTOT+MB+TROPI)
Relative Index: INVALID (ref 0.0–2.5)
Total CK: 31 U/L (ref 7–232)

## 2012-03-10 LAB — TROPONIN I: Troponin I: <0.3 ng/mL (ref <0.30)

## 2012-03-10 LAB — PRO B NATRIURETIC PEPTIDE: Pro B Natriuretic peptide (BNP): 2962 pg/mL — ABNORMAL HIGH (ref 0–450)

## 2012-03-10 MED ORDER — METHYLPREDNISOLONE SODIUM SUCC 125 MG IJ SOLR
60.0000 mg | Freq: Four times a day (QID) | INTRAMUSCULAR | Status: DC
  Administered 2012-03-11: 60 mg via INTRAVENOUS
  Filled 2012-03-10 (×4): qty 0.96

## 2012-03-10 MED ORDER — BUDESONIDE 0.25 MG/2ML IN SUSP
0.2500 mg | Freq: Four times a day (QID) | RESPIRATORY_TRACT | Status: DC
  Filled 2012-03-10 (×3): qty 2

## 2012-03-10 MED ORDER — VITAMIN D3 50 MCG (2000 UT) PO TABS: 2000.0000 [IU] | ORAL_TABLET | Freq: Every day | ORAL | Status: DC

## 2012-03-10 MED ORDER — LEVALBUTEROL HCL 0.63 MG/3ML IN NEBU
0.6300 mg | INHALATION_SOLUTION | RESPIRATORY_TRACT | Status: DC | PRN
  Administered 2012-03-10 – 2012-03-11 (×2): 0.63 mg via RESPIRATORY_TRACT
  Filled 2012-03-10: qty 3

## 2012-03-10 MED ORDER — BUDESONIDE 0.5 MG/2ML IN SUSP
0.5000 mg | Freq: Two times a day (BID) | RESPIRATORY_TRACT | Status: DC | PRN
  Filled 2012-03-10: qty 2

## 2012-03-10 MED ORDER — DIGOXIN 250 MCG PO TABS
250.0000 ug | ORAL_TABLET | Freq: Every day | ORAL | Status: DC
  Administered 2012-03-10 – 2012-03-19 (×10): 250 ug via ORAL
  Filled 2012-03-10 (×10): qty 1

## 2012-03-10 MED ORDER — CHLORHEXIDINE GLUCONATE CLOTH 2 % EX PADS
6.0000 | MEDICATED_PAD | Freq: Every day | CUTANEOUS | Status: AC
  Administered 2012-03-11 – 2012-03-15 (×5): 6 via TOPICAL

## 2012-03-10 MED ORDER — BISOPROLOL FUMARATE 5 MG PO TABS
5.0000 mg | ORAL_TABLET | Freq: Every day | ORAL | Status: DC
  Administered 2012-03-10 – 2012-03-14 (×5): 5 mg via ORAL
  Filled 2012-03-10 (×6): qty 1

## 2012-03-10 MED ORDER — GUAIFENESIN ER 600 MG PO TB12
1200.0000 mg | ORAL_TABLET | Freq: Two times a day (BID) | ORAL | Status: DC
  Administered 2012-03-10 – 2012-03-19 (×18): 1200 mg via ORAL
  Filled 2012-03-10 (×19): qty 2

## 2012-03-10 MED ORDER — LEVALBUTEROL HCL 0.63 MG/3ML IN NEBU: 0.6300 mg | INHALATION_SOLUTION | RESPIRATORY_TRACT | Status: DC | PRN

## 2012-03-10 MED ORDER — PANTOPRAZOLE SODIUM 40 MG PO TBEC
40.0000 mg | DELAYED_RELEASE_TABLET | Freq: Every day | ORAL | Status: DC
  Administered 2012-03-10 – 2012-03-19 (×10): 40 mg via ORAL
  Filled 2012-03-10 (×10): qty 1

## 2012-03-10 MED ORDER — NITROGLYCERIN 0.4 MG SL SUBL: 0.4000 mg | SUBLINGUAL_TABLET | SUBLINGUAL | Status: DC | PRN

## 2012-03-10 MED ORDER — DILTIAZEM HCL 100 MG IV SOLR
5.0000 mg/h | Freq: Once | INTRAVENOUS | Status: AC
  Administered 2012-03-10: 5 mg/h via INTRAVENOUS
  Filled 2012-03-10: qty 100

## 2012-03-10 MED ORDER — ISOSORBIDE MONONITRATE ER 60 MG PO TB24
60.0000 mg | ORAL_TABLET | Freq: Every day | ORAL | Status: DC
  Administered 2012-03-10 – 2012-03-19 (×10): 60 mg via ORAL
  Filled 2012-03-10 (×10): qty 1

## 2012-03-10 MED ORDER — CLOPIDOGREL BISULFATE 75 MG PO TABS: 75.0000 mg | ORAL_TABLET | Freq: Every day | ORAL | Status: DC

## 2012-03-10 MED ORDER — SODIUM CHLORIDE 0.9 % IJ SOLN: 3.0000 mL | INTRAMUSCULAR | Status: DC | PRN

## 2012-03-10 MED ORDER — CLOPIDOGREL BISULFATE 75 MG PO TABS
75.0000 mg | ORAL_TABLET | Freq: Every day | ORAL | Status: DC
  Administered 2012-03-11 – 2012-03-19 (×9): 75 mg via ORAL
  Filled 2012-03-10 (×12): qty 1

## 2012-03-10 MED ORDER — PREDNISONE 20 MG PO TABS: 20.0000 mg | ORAL_TABLET | Freq: Every day | ORAL | Status: DC

## 2012-03-10 MED ORDER — POTASSIUM GLUCONATE 550 MG PO TABS: 1.0000 | ORAL_TABLET | Freq: Every day | ORAL | Status: DC

## 2012-03-10 MED ORDER — TAMSULOSIN HCL 0.4 MG PO CAPS
0.4000 mg | ORAL_CAPSULE | Freq: Every day | ORAL | Status: DC
  Administered 2012-03-11 – 2012-03-18 (×8): 0.4 mg via ORAL
  Filled 2012-03-10 (×10): qty 1

## 2012-03-10 MED ORDER — METHYLPREDNISOLONE SODIUM SUCC 125 MG IJ SOLR: 125.0000 mg | Freq: Every day | INTRAMUSCULAR | Status: AC

## 2012-03-10 MED ORDER — OXYCODONE HCL 5 MG PO TABS: 5.0000 mg | ORAL_TABLET | ORAL | Status: DC | PRN

## 2012-03-10 MED ORDER — BUDESONIDE 0.25 MG/2ML IN SUSP
0.2500 mg | Freq: Four times a day (QID) | RESPIRATORY_TRACT | Status: DC
  Administered 2012-03-10 – 2012-03-19 (×31): 0.25 mg via RESPIRATORY_TRACT
  Filled 2012-03-10 (×42): qty 2

## 2012-03-10 MED ORDER — ACETAMINOPHEN 325 MG PO TABS: 650.0000 mg | ORAL_TABLET | ORAL | Status: DC | PRN

## 2012-03-10 MED ORDER — LEVOFLOXACIN 500 MG PO TABS: 500.0000 mg | ORAL_TABLET | Freq: Every day | ORAL | Status: DC

## 2012-03-10 MED ORDER — DILTIAZEM HCL 100 MG IV SOLR
5.0000 mg/h | INTRAVENOUS | Status: DC
  Administered 2012-03-11: 5 mg/h via INTRAVENOUS
  Administered 2012-03-12: 10 mg/h via INTRAVENOUS
  Administered 2012-03-12 – 2012-03-13 (×2): 5 mg/h via INTRAVENOUS
  Filled 2012-03-10: qty 100

## 2012-03-10 MED ORDER — SODIUM CHLORIDE 0.9 % IJ SOLN
3.0000 mL | Freq: Two times a day (BID) | INTRAMUSCULAR | Status: DC
  Administered 2012-03-10 – 2012-03-19 (×15): 3 mL via INTRAVENOUS

## 2012-03-10 MED ORDER — BISACODYL 10 MG RE SUPP: 10.0000 mg | RECTAL | Status: DC | PRN

## 2012-03-10 MED ORDER — OXYCODONE HCL 5 MG PO CAPS: 5.0000 mg | ORAL_CAPSULE | ORAL | Status: DC | PRN

## 2012-03-10 MED ORDER — LEVALBUTEROL TARTRATE 45 MCG/ACT IN AERO: 1.0000 | INHALATION_SPRAY | RESPIRATORY_TRACT | Status: DC | PRN

## 2012-03-10 MED ORDER — NITROGLYCERIN 0.4 MG SL SUBL
0.4000 mg | SUBLINGUAL_TABLET | SUBLINGUAL | Status: DC | PRN
  Administered 2012-03-10: 0.4 mg via SUBLINGUAL
  Filled 2012-03-10: qty 75

## 2012-03-10 MED ORDER — OXYCODONE HCL 5 MG PO TABS
5.0000 mg | ORAL_TABLET | ORAL | Status: DC | PRN
  Filled 2012-03-10: qty 1

## 2012-03-10 MED ORDER — SENNA 8.6 MG PO TABS
2.0000 | ORAL_TABLET | Freq: Two times a day (BID) | ORAL | Status: DC
  Administered 2012-03-10 – 2012-03-19 (×15): 17.2 mg via ORAL
  Filled 2012-03-10 (×19): qty 2

## 2012-03-10 MED ORDER — FUROSEMIDE 40 MG PO TABS
40.0000 mg | ORAL_TABLET | Freq: Two times a day (BID) | ORAL | Status: DC
  Administered 2012-03-10 – 2012-03-12 (×5): 40 mg via ORAL
  Filled 2012-03-10 (×6): qty 1

## 2012-03-10 MED ORDER — ONDANSETRON HCL 4 MG/2ML IJ SOLN: 4.0000 mg | Freq: Four times a day (QID) | INTRAMUSCULAR | Status: DC | PRN

## 2012-03-10 MED ORDER — VITAMIN D3 25 MCG (1000 UNIT) PO TABS
2000.0000 [IU] | ORAL_TABLET | Freq: Every day | ORAL | Status: DC
  Administered 2012-03-10 – 2012-03-19 (×10): 2000 [IU] via ORAL
  Filled 2012-03-10 (×10): qty 2

## 2012-03-10 MED ORDER — WARFARIN SODIUM 5 MG PO TABS: 5.0000 mg | ORAL_TABLET | ORAL | Status: DC

## 2012-03-10 MED ORDER — WARFARIN SODIUM 5 MG PO TABS
5.0000 mg | ORAL_TABLET | Freq: Once | ORAL | Status: AC
  Administered 2012-03-10: 5 mg via ORAL
  Filled 2012-03-10: qty 1

## 2012-03-10 MED ORDER — WARFARIN - PHARMACIST DOSING INPATIENT: Freq: Every day | Status: DC

## 2012-03-10 MED ORDER — ASPIRIN 325 MG PO TABS: 325.0000 mg | ORAL_TABLET | ORAL | Status: DC

## 2012-03-10 MED ORDER — SODIUM CHLORIDE 0.9 % IV SOLN
250.0000 mL | INTRAVENOUS | Status: DC | PRN
  Administered 2012-03-13: 10 mL/h via INTRAVENOUS

## 2012-03-10 MED ORDER — LEVOFLOXACIN IN D5W 750 MG/150ML IV SOLN
750.0000 mg | INTRAVENOUS | Status: DC
  Administered 2012-03-10 – 2012-03-12 (×3): 750 mg via INTRAVENOUS
  Filled 2012-03-10 (×4): qty 150

## 2012-03-10 MED ORDER — MUPIROCIN 2 % EX OINT
1.0000 "application " | TOPICAL_OINTMENT | Freq: Two times a day (BID) | CUTANEOUS | Status: AC
  Administered 2012-03-10 – 2012-03-15 (×9): 1 via NASAL
  Filled 2012-03-10 (×2): qty 22

## 2012-03-10 MED ORDER — ASPIRIN 81 MG PO CHEW
324.0000 mg | CHEWABLE_TABLET | ORAL | Status: AC
  Administered 2012-03-10: 324 mg via ORAL
  Filled 2012-03-10: qty 4

## 2012-03-10 MED ORDER — ASPIRIN 300 MG RE SUPP
300.0000 mg | RECTAL | Status: AC
  Filled 2012-03-10: qty 1

## 2012-03-10 NOTE — ED Notes (Signed)
Pt is currently in radiology. 

## 2012-03-10 NOTE — ED Notes (Signed)
Pt arrives via EMS. Reports productive cough gray sputum for the last week. Seen by PMD on Thurs and given script prednisone. Minor improvement in symptoms. Called PMD yesterday pt prescribed levaquin yesterday over the phone. Took 1 dose of levaquin yesterday. Today arrives c/o SOB and weakness. Pt denies fevers. Pt had some epigastric pain this AM. Took an antacid this AM with no relief. Hx of of 4 MIs.

## 2012-03-10 NOTE — Progress Notes (Signed)
MRSA swab came back positive place on contact.  Educated pt.  VS stable will continue to monitor.[name] Liter

## 2012-03-10 NOTE — ED Notes (Signed)
Pt is currently back in his room from radiology

## 2012-03-10 NOTE — H&P (Signed)
[name] is an 78 y.o. male.   Chief Complaint: Chest pain and increased dyspnea. Cardiologist: Dr. Lewayne Bunting Pulmonologist: Dr. Marchelle Gearing Medical Doctor:  Dr. Weber Cooks. [name] HPI: This 78 year old gentleman is a resident of [facility] and was brought to ER  By EMS after having chest pain and increased dyspnea.  Past history of severe COPD and seen last week by pulmonary in office for exacerbation and was started on prednisone taper and on Levoquin. This am developed chest pain lying in bed, tried TUMS with no response, then called 911. Pain substernal, radiated to right shoulder. No diaphoresis or nausea. Coughing greyish sputum this am.   Past Medical History  Diagnosis Date  . Pneumonia 12/06/10    healthcare-associated/ left lower lobe  . COPD (chronic obstructive pulmonary disease)     severe stage IV  . Atrial fibrillation     on Coumadin with St. Jude single-chamber pacemaker  . Diabetes mellitus     type 2; s/p Prednisone therapy for pneumonia 12/2010  . Coronary artery disease     s/p anterior STEMI 09/2009; cath. revealed mid LAD 40%, distal LAD 100%- PTCA, prox. RCA 40%, mid. RCA 100% with good collateral filling of PDA; EF 25%; NSTEMI 07/2011 - PCI/DES 100% LCX  . ST elevation (STEMI) myocardial infarction 01//11/12    anterior  . CHF (congestive heart failure)     EF 25% on 09/26/10; EF 50-55% and grade 1 diastolic dysfunction on ECHO 08/2010   . Hypertension   . Hyperlipidemia   . AAA (abdominal aortic aneurysm)     3 cm infrarenal abdominal aortic aneurysm per aorta ultrasound 03/2010  . Osteoarthritis     s/p bilat hip arthroplasty  . Angina   . Ischemic cardiomyopathy     EF 35% LHC 11/12  . GERD (gastroesophageal reflux disease)   . Hypercholesteremia   . PVD (peripheral vascular disease)   . Anxiety     Past Surgical History  Procedure Date  . Hip arthroplasty     s/p right 09/27/10 and s/p left 09/24/10  . Back surgery   . Knee surgery     . Pacemaker insertion 12/2010    St. Jude single-chamber   . Cardiac catheterization 09/2009, 06/2007    PTCA to distal LAD    Family History  Problem Relation Age of Onset  . Heart attack Mother 97  . Heart attack Father 75  . Cancer Other     siblings  . Heart attack Other    Social History:  reports that he quit smoking about 2 years ago. His smoking use included Cigarettes. He has a 100 pack-year smoking history. He has never used smokeless tobacco. He reports that he does not drink alcohol or use illicit drugs.  Allergies:  Allergies  Allergen Reactions  . Morphine Itching     (Not in a hospital admission)  Results for orders placed during the hospital encounter of 03/10/12 (from the past 48 hour(s))  CBC     Status: Normal   Collection Time   03/10/12 11:42 AM      Component Value Range Comment   WBC 10.0  4.0 - 10.5 K/uL    RBC 5.32  4.22 - 5.81 MIL/uL    Hemoglobin 16.7  13.0 - 17.0 g/dL    HCT 96.0  45.4 - 09.8 %    MCV 92.1  78.0 - 100.0 fL    MCH 31.4  26.0 - 34.0 pg  MCHC 34.1  30.0 - 36.0 g/dL    RDW 11.9  14.7 - 82.9 %    Platelets 184  150 - 400 K/uL   BASIC METABOLIC PANEL     Status: Abnormal   Collection Time   03/10/12 11:42 AM      Component Value Range Comment   Sodium 137  135 - 145 mEq/L    Potassium 3.9  3.5 - 5.1 mEq/L    Chloride 99  96 - 112 mEq/L    CO2 27  19 - 32 mEq/L    Glucose, Bld 119 (*) 70 - 99 mg/dL    BUN 22  6 - 23 mg/dL    Creatinine, Ser 5.62  0.50 - 1.35 mg/dL    Calcium 9.9  8.4 - 13.0 mg/dL    GFR calc non Af Amer 82 (*) >90 mL/min    GFR calc Af Amer >90  >90 mL/min   TROPONIN I     Status: Normal   Collection Time   03/10/12 12:07 PM      Component Value Range Comment   Troponin I <0.30  <0.30 ng/mL   PROTIME-INR     Status: Abnormal   Collection Time   03/10/12 12:15 PM      Component Value Range Comment   Prothrombin Time 23.1 (*) 11.6 - 15.2 seconds    INR 2.01 (*) 0.00 - 1.49    Dg Chest 2  View  03/10/2012  *RADIOLOGY REPORT*  Clinical Data: Cough and chest pain  CHEST - 2 VIEW  Comparison: 11/07/2011  Findings: Left subclavian single lead pacemaker device and leads are stable.  Lungs are hyperaerated without pneumothorax.  Mild cardiomegaly.  Normal vascularity.  No pleural effusion.  Chronic changes at the right lung base.  IMPRESSION: Cardiomegaly.  Changes related to COPD.  Original Report Authenticated By: Donavan Burnet, M.D.    ROS: Mild urinary slow flow, no dysuria.  No fever or chills. No constipation normally. Moderate chronic back pain and spinal stenosis.  Blood pressure 151/69, pulse 125, temperature 97.7 F (36.5 C), resp. rate 22, SpO2 99.00%. The patient appears to be in mild distress.  Head and neck exam reveals that the pupils are equal and reactive.  The extraocular movements are full.  There is no scleral icterus.  Mouth and pharynx are benign.  No lymphadenopathy.  No carotid bruits.  The jugular venous pressure is normal.  Thyroid is not enlarged or tender.  Chest reveals coarse rhonchi bilaterally.  Heart reveals no abnormal lift or heave.  First and second heart sounds are distant. There is no murmur gallop rub or click.  The abdomen is soft and nontender.  Bowel sounds are normoactive.  There is no hepatosplenomegaly or mass.  There are no abdominal bruits.  Extremities reveal no phlebitis or edema.  Pedal pulses are good.  There is no cyanosis or clubbing.  Neurologic exam is normal strength and no lateralizing weakness.  No sensory deficits.  Integument reveals no rash  EKG reveals atrial fib with rapid ventricular response.  No acute ischemic changes. Occasional ventricular paced beats. Assessment/Plan 1. Chest pain in patient with known coronary heart disease, last cath 08/13/11 with DES to occluded LCx by Dr. Excell Seltzer. 2. Chronic atrial fib with rapid ventricular response, on long term coumadin. 3. Severe COPD with exacerbation, on recent  steroids and Levoquin. 4. Acute on chronic combined systolic and diastolic CHF 5. Known AAA by history--no intervention planned.  Plan: Admit to  stepdown.  IV diltiazem for rate control. Serial cardiac enzymes. Will ask pulmonary to see and help with exacerbation of COPD   Cassell Clement 03/10/2012, 2:59 PM

## 2012-03-10 NOTE — Consult Note (Signed)
Name: [name] MRN: [id number] DOB: 1928-05-11    LOS: 0  Referring Provider:  Dr. 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  Was seen in office on 6/20 and given a prednisone taper then followed up on 6/24 where he was noted to have purulent sputum.  Levaquin was called for the patient.  The patient reports to the ED on 6/25 with chest pain and dyspnea.  In the ED the patient was noted new onset a-fib.  PCCM was consulted for COPD exacerbation management.  Past Medical History  Diagnosis Date  . Pneumonia 12/06/10    healthcare-associated/ left lower lobe  . COPD (chronic obstructive pulmonary disease)     severe stage IV  . Atrial fibrillation     on Coumadin with St. Jude single-chamber pacemaker  . Diabetes mellitus     type 2; s/p Prednisone therapy for pneumonia 12/2010  . Coronary artery disease     s/p anterior STEMI 09/2009; cath. revealed mid LAD 40%, distal LAD 100%- PTCA, prox. RCA 40%, mid. RCA 100% with good collateral filling of PDA; EF 25%; NSTEMI 07/2011 - PCI/DES 100% LCX  . ST elevation (STEMI) myocardial infarction 01//11/12    anterior  . CHF (congestive heart failure)     EF 25% on 09/26/10; EF 50-55% and grade 1 diastolic dysfunction on ECHO 08/2010   . Hypertension   . Hyperlipidemia   . AAA (abdominal aortic aneurysm)     3 cm infrarenal abdominal aortic aneurysm per aorta ultrasound 03/2010  . Osteoarthritis     s/p bilat hip arthroplasty  . Angina   . Ischemic cardiomyopathy     EF 35% LHC 11/12  . GERD (gastroesophageal reflux disease)   . Hypercholesteremia   . PVD (peripheral vascular disease)   . Anxiety    Past Surgical History  Procedure Date  . Hip arthroplasty     s/p right 09/27/10 and s/p left 09/24/10  . Back surgery   . Knee surgery   . Pacemaker insertion 12/2010   St. Jude single-chamber   . Cardiac catheterization 09/2009, 06/2007    PTCA to distal LAD   Prior to Admission medications   Medication Sig Start Date End Date Taking? Authorizing Provider  acetaminophen (TYLENOL) 325 MG tablet Take 650 mg by mouth every 4 (four) hours as needed. Prior to oxycodone if needed    Historical Provider, MD  bisacodyl (DULCOLAX) 10 MG suppository Place 10 mg rectally as needed.    Historical Provider, MD  bisoprolol (ZEBETA) 5 MG tablet Take 5 mg by mouth daily.      Historical Provider, MD  budesonide (PULMICORT) 0.5 MG/2ML nebulizer solution Take 0.5 mg by nebulization 2 (two) times daily as needed. For breathing    Historical Provider, MD  Cholecalciferol (VITAMIN D3) 2000 UNITS TABS Take by mouth daily.     Historical Provider, MD  clopidogrel (PLAVIX) 75 MG tablet Take 1 tablet (75 mg total) by mouth daily with breakfast. 08/15/11 08/14/12  Ok Anis, NP  digoxin (LANOXIN) 0.25 MG tablet Take 250 mcg by mouth daily.      Historical Provider, MD  diltiazem (DILACOR XR) 240 MG 24 hr capsule Take 1 capsule (240 mg total) by mouth daily. 08/27/11   Beatrice Lecher, PA  furosemide (LASIX) 40 MG tablet Take  40 mg by mouth 2 (two) times daily.   08/30/11   Beatrice Lecher, PA  guaiFENesin (MUCINEX) 600 MG 12 hr tablet Take 1,200 mg by mouth 2 (two) times daily.    Historical Provider, MD  isosorbide mononitrate (IMDUR) 60 MG 24 hr tablet Take 60 mg by mouth daily.      Historical Provider, MD  levalbuterol [facility] HFA) 45 MCG/ACT inhaler Inhale 1-2 puffs into the lungs every 4 (four) hours as needed for wheezing. 09/02/11 09/01/12  Leslye Peer, MD  levalbuterol (XOPENEX) 0.63 MG/3ML nebulizer solution Take 3 mLs (0.63 mg total) by nebulization every 3 (three) hours as needed for wheezing or shortness of breath. 11/15/11 11/14/12  Simonne Martinet, NP  levofloxacin (LEVAQUIN) 500 MG tablet Take 1 tablet (500 mg total) by mouth daily. 03/09/12 03/19/12  Kalman Shan, MD  nitroGLYCERIN (NITROSTAT) 0.4 MG SL tablet Place 1 tablet (0.4 mg total) under the tongue every 5 (five) minutes as needed. 11/15/11   Simonne Martinet, NP  omeprazole (PRILOSEC) 20 MG capsule Take 20 mg by mouth daily.    Historical Provider, MD  oxyCODONE (OXY IR/ROXICODONE) 5 MG immediate release tablet Take 1 tablet (5 mg total) by mouth every 4 (four) hours as needed (dyspnea). 11/15/11 01/09/12  Simonne Martinet, NP  oxycodone (OXY-IR) 5 MG capsule Take 5 mg by mouth every 4 (four) hours as needed.    Historical Provider, MD  Potassium Gluconate 550 MG TABS Take 1 tablet by mouth daily.     Historical Provider, MD  predniSONE (DELTASONE) 10 MG tablet Take 40mg  daily for 3 days, then 30mg  daily for 3 days, then 20mg  daily for 3 days, then 10mg  daily for 3 days, then stop 03/05/12   Leslye Peer, MD  senna (SENOKOT) 8.6 MG TABS Take 2 tablets (17.2 mg total) by mouth 2 (two) times daily. 11/15/11   Simonne Martinet, NP  Tamsulosin HCl (FLOMAX) 0.4 MG CAPS Take 1 capsule (0.4 mg total) by mouth daily after supper. 11/15/11   Simonne Martinet, NP  warfarin (COUMADIN) 3 MG tablet Take by mouth as directed.     Historical Provider, MD   Allergies Allergies  Allergen Reactions  . Morphine Itching   Family History Family History  Problem Relation Age of Onset  . Heart attack Mother 22  . Heart attack Father 75  . Cancer Other     siblings  . Heart attack Other    Social History  reports that he quit smoking about 2 years ago. His smoking use included Cigarettes. He has a 100 pack-year smoking history. He has never used smokeless tobacco. He reports that he does not drink alcohol or use illicit drugs.  Review Of Systems:  Significant for CP and increased SOB.  Otherwise 12 point review of system is negative.  Brief patient description:  78 year old with severe COPD presenting with new onset a-fib with RVR and hypoxemia.  Current Status: Guarded.  Vital Signs: Temp:  [97.7 F (36.5  C)] 97.7 F (36.5 C) (06/25 1126) Pulse Rate:  [53-149] 125  (06/25 1430) Resp:  [15-29] 22  (06/25 1430) BP: (117-157)/(63-133) 151/69 mmHg (06/25 1430) SpO2:  [94 %-99 %] 99 % (06/25 1430)  Physical Examination: General:  Chronically ill appearing elderly male. Neuro:  Alert and oriented, moving all ext to command. HEENT:  [place]/AT, PERRL, EOM-I and DMM. Neck:  Supple, -LAN and -thyromegally. Cardiovascular:  IRIR, Nl S1/S2, -M/R/G. Lungs:  Distant  BS, minimal end exp wheezes. Abdomen:  Soft, NT, ND and +BS. Musculoskeletal:  -edema and -tenderness, - clubbing. Skin:  Thing but intact.  Active Problems:  COPD (chronic obstructive pulmonary disease)  Hypoxemia  Bronchitis  Pulmonary edema   ASSESSMENT AND PLAN  PULMONARY No results found for this basename: PHART:5,PCO2:5,PCO2ART:5,PO2ART:5,HCO3:5,O2SAT:5 in the last 168 hours Ventilator Settings: Welcome   CXR:  Cardiomegally and COPD, mild pulmonary edema. ETT:  None  A:  78 year old male with severe COPD presenting with COPD exacerbation and a-fib with RVR. P:   Solumedrol. Levaquin (patient has not been exposed to health care settings since February and levaquin was just started on 6/24 so will not treat as resistance as an issue). PRN xopenex. Nebulized budesonide. F/U CXR. Cultures as below.  CARDIOVASCULAR  Lab 03/10/12 1207  TROPONINI <0.30  LATICACIDVEN --  PROBNP --   ECG:  A-fib. Lines: PIV.  A: CHF and a-fib history. P:  Cardiazem drip. Would only use zebeta for beta blockers.  RENAL  Lab 03/10/12 1142  NA 137  K 3.9  CL 99  CO2 27  BUN 22  CREATININE 0.80  CALCIUM 9.9  MG --  PHOS --   Intake/Output    None    Foley:  None  A:  No active issues. P:   F/U BMET as patient is being diuresed.  HEMATOLOGIC  Lab 03/10/12 1215 03/10/12 1142  HGB -- 16.7  HCT -- 49.0  PLT -- 184  INR 2.01* --  APTT -- --   A:  INR 2 due to coumadin. P:  Coumadin per  pharmacy.  INFECTIOUS  Lab 03/10/12 1142  WBC 10.0  PROCALCITON --   Cultures: Blood 6/25>>> Urine 6/25>>> Sputum 6/25>>> Antibiotics: Levaquin 6/24>>>  A:  Purulent bronchitis. P:   Pan culture. Levaquin.  ENDOCRINE No results found for this basename: GLUCAP:5 in the last 168 hours A:  No diabetes history.   P:   Follow sugars while on steroids.  PCCM will follow up with you.  Koren Bound, M.D. Pulmonary and Critical Care Medicine [facility] Pager: [phone]  03/10/2012, 3:47 PM

## 2012-03-10 NOTE — ED Notes (Signed)
Dr. Juleen China aware patient c/o chest pain substernal, described as tightness.

## 2012-03-10 NOTE — ED Provider Notes (Signed)
History     CSN: [id number]  Arrival date & time 03/10/12  1124   First MD Initiated Contact with Patient 03/10/12 1234      Chief Complaint  Patient presents with  . Chest Pain    Patient is a 78 y.o. male presenting with palpitations. The history is provided by the patient and a relative.  Palpitations  This is a recurrent problem. The current episode started 3 to 5 hours ago. The problem occurs constantly. The problem has not changed since onset.Associated with: Patient has history of paroxismal atrial fibrillation (on Cardizem, Digoxin, and Coumadin) Associated symptoms include a fever, weakness, cough and shortness of breath. Pertinent negatives include no diaphoresis, no numbness, no chest pain, no near-syncope, no abdominal pain, no nausea, no vomiting, no headaches, no lower extremity edema and no dizziness.    Past Medical History  Diagnosis Date  . Pneumonia 12/06/10    healthcare-associated/ left lower lobe  . COPD (chronic obstructive pulmonary disease)     severe stage IV  . Atrial fibrillation     on Coumadin with St. Jude single-chamber pacemaker  . Diabetes mellitus     type 2; s/p Prednisone therapy for pneumonia 12/2010  . Coronary artery disease     s/p anterior STEMI 09/2009; cath. revealed mid LAD 40%, distal LAD 100%- PTCA, prox. RCA 40%, mid. RCA 100% with good collateral filling of PDA; EF 25%; NSTEMI 07/2011 - PCI/DES 100% LCX  . ST elevation (STEMI) myocardial infarction 01//11/12    anterior  . CHF (congestive heart failure)     EF 25% on 09/26/10; EF 50-55% and grade 1 diastolic dysfunction on ECHO 08/2010   . Hypertension   . Hyperlipidemia   . AAA (abdominal aortic aneurysm)     3 cm infrarenal abdominal aortic aneurysm per aorta ultrasound 03/2010  . Osteoarthritis     s/p bilat hip arthroplasty  . Angina   . Ischemic cardiomyopathy     EF 35% LHC 11/12  . GERD (gastroesophageal reflux disease)   . Hypercholesteremia   . PVD (peripheral  vascular disease)   . Anxiety     Past Surgical History  Procedure Date  . Hip arthroplasty     s/p right 09/27/10 and s/p left 09/24/10  . Back surgery   . Knee surgery   . Pacemaker insertion 12/2010    St. Jude single-chamber   . Cardiac catheterization 09/2009, 06/2007    PTCA to distal LAD    Family History  Problem Relation Age of Onset  . Heart attack Mother 74  . Heart attack Father 12  . Cancer Other     siblings  . Heart attack Other     History  Substance Use Topics  . Smoking status: Former Smoker -- 2.0 packs/day for 50 years    Types: Cigarettes    Quit date: 09/02/2009  . Smokeless tobacco: Never Used   Comment: smoked for 42yrs quit for 38yrs started back & smoked for 10ys  . Alcohol Use: No     occ - alcohol      Review of Systems  Constitutional: Positive for fever and chills. Negative for diaphoresis, activity change and appetite change.  HENT: Negative for neck pain.   Respiratory: Positive for cough and shortness of breath. Negative for chest tightness and wheezing.   Cardiovascular: Positive for palpitations. Negative for chest pain, leg swelling and near-syncope.  Gastrointestinal: Negative for nausea, vomiting, abdominal pain, diarrhea and constipation.  Skin: Negative for rash  and wound.  Neurological: Positive for weakness. Negative for dizziness, seizures, syncope, light-headedness, numbness and headaches.  All other systems reviewed and are negative.    Allergies  Morphine  Home Medications   Current Outpatient Rx  Name Route Sig Dispense Refill  . ACETAMINOPHEN 325 MG PO TABS Oral Take 650 mg by mouth every 4 (four) hours as needed. Prior to oxycodone if needed    . BISACODYL 10 MG RE SUPP Rectal Place 10 mg rectally as needed.    Marland Kitchen BISOPROLOL FUMARATE 5 MG PO TABS Oral Take 5 mg by mouth daily.      . BUDESONIDE 0.5 MG/2ML IN SUSP Nebulization Take 0.5 mg by nebulization 2 (two) times daily as needed. For breathing    .  VITAMIN D3 2000 UNITS PO TABS Oral Take by mouth daily.     Marland Kitchen CLOPIDOGREL BISULFATE 75 MG PO TABS Oral Take 1 tablet (75 mg total) by mouth daily with breakfast. 30 tablet 6  . DIGOXIN 0.25 MG PO TABS Oral Take 250 mcg by mouth daily.      Marland Kitchen DILTIAZEM HCL ER 240 MG PO CP24 Oral Take 1 capsule (240 mg total) by mouth daily.    . FUROSEMIDE 40 MG PO TABS Oral Take 40 mg by mouth 2 (two) times daily.      . GUAIFENESIN ER 600 MG PO TB12 Oral Take 1,200 mg by mouth 2 (two) times daily.    . ISOSORBIDE MONONITRATE ER 60 MG PO TB24 Oral Take 60 mg by mouth daily.      Marland Kitchen LEVALBUTEROL TARTRATE 45 MCG/ACT IN AERO Inhalation Inhale 1-2 puffs into the lungs every 4 (four) hours as needed for wheezing. 1 Inhaler 12  . LEVALBUTEROL HCL 0.63 MG/3ML IN NEBU Nebulization Take 3 mLs (0.63 mg total) by nebulization every 3 (three) hours as needed for wheezing or shortness of breath. 3 mL 0  . LEVOFLOXACIN 500 MG PO TABS Oral Take 1 tablet (500 mg total) by mouth daily. 6 tablet 0  . NITROGLYCERIN 0.4 MG SL SUBL Sublingual Place 1 tablet (0.4 mg total) under the tongue every 5 (five) minutes as needed. 25 tablet 3  . OMEPRAZOLE 20 MG PO CPDR Oral Take 20 mg by mouth daily.    . OXYCODONE HCL 5 MG PO TABS Oral Take 1 tablet (5 mg total) by mouth every 4 (four) hours as needed (dyspnea). 30 tablet 0  . OXYCODONE HCL 5 MG PO CAPS Oral Take 5 mg by mouth every 4 (four) hours as needed.    Marland Kitchen POTASSIUM GLUCONATE 550 MG PO TABS Oral Take 1 tablet by mouth daily.     Marland Kitchen PREDNISONE 10 MG PO TABS  Take 40mg  daily for 3 days, then 30mg  daily for 3 days, then 20mg  daily for 3 days, then 10mg  daily for 3 days, then stop 30 tablet 0  . SENNA 8.6 MG PO TABS Oral Take 2 tablets (17.2 mg total) by mouth 2 (two) times daily. 120 each   . TAMSULOSIN HCL 0.4 MG PO CAPS Oral Take 1 capsule (0.4 mg total) by mouth daily after supper. 30 capsule 0  . WARFARIN SODIUM 3 MG PO TABS Oral Take by mouth as directed.       BP 124/81  Pulse 57   Temp 97.7 F (36.5 C)  Resp 23  SpO2 95%  Physical Exam  Nursing note and vitals reviewed. Constitutional: He appears well-developed and well-nourished.  HENT:  Head: Normocephalic and atraumatic.  Right  Ear: External ear normal.  Left Ear: External ear normal.  Nose: Nose normal.  Mouth/Throat: Oropharynx is clear and moist. No oropharyngeal exudate.  Eyes: Conjunctivae are normal. Pupils are equal, round, and reactive to light.  Neck: Normal range of motion. Neck supple.  Cardiovascular: Normal heart sounds and intact distal pulses.        Irregularly irregular  Tachycardic   Pulmonary/Chest: Effort normal. No respiratory distress. He has wheezes (bilaterally and diffusely). He has no rales. He exhibits no tenderness.  Abdominal: Soft. Bowel sounds are normal. He exhibits no distension and no mass. There is no tenderness. There is no rebound and no guarding.  Musculoskeletal: Normal range of motion. He exhibits no edema and no tenderness.  Neurological: He is alert. He displays normal reflexes. No cranial nerve deficit. He exhibits normal muscle tone. Coordination normal.  Skin: Skin is warm and dry. No rash noted. No erythema. No pallor.  Psychiatric: He has a normal mood and affect. His behavior is normal. Judgment and thought content normal.    ED Course  Procedures (including critical care time)  Labs Reviewed  BASIC METABOLIC PANEL - Abnormal; Notable for the following:    Glucose, Bld 119 (*)     GFR calc non Af Amer 82 (*)     All other components within normal limits  PROTIME-INR - Abnormal; Notable for the following:    Prothrombin Time 23.1 (*)     INR 2.01 (*)     All other components within normal limits  CBC  TROPONIN I   Dg Chest 2 View  03/10/2012  *RADIOLOGY REPORT*  Clinical Data: Cough and chest pain  CHEST - 2 VIEW  Comparison: 11/07/2011  Findings: Left subclavian single lead pacemaker device and leads are stable.  Lungs are hyperaerated without  pneumothorax.  Mild cardiomegaly.  Normal vascularity.  No pleural effusion.  Chronic changes at the right lung base.  IMPRESSION: Cardiomegaly.  Changes related to COPD.  Original Report Authenticated By: Donavan Burnet, M.D.     1. COPD exacerbation   2. COPD, severe   3. Atrial fibrillation      Date: 03/10/2012  Rate: 106 bpm  Rhythm: atrial fibrillation  QRS Axis: normal  Intervals: no PR interval (no P wave)  ST/T Wave abnormalities: nonspecific ST changes  Conduction Disutrbances: premature ventricular contractions  Narrative Interpretation: Atrial fibrillation with rapid ventricular rate  Old EKG Reviewed: changes noted now with rapid ventricular rate (08/15/11)    MDM  78 yo M w/hx of Paroxysmal Atrial Fibrillation and COPD presents with dyspnea, cough of several weeks that has become productive recently, and generalized weakness. Denies chest pain, nausea, or diaphoresis. Clinical picture not concerning for ACS or PE. CXR negative for evidence of PNA. Pt in A Fib with RVR in ED; cardizem bolused and drip initiated with rate control but not conversion to sinus. Dr. Ladona Ridgel [facility]) is his Cardiologist; Cardiology consulted and will admit.         Clemetine Marker, MD 03/10/12 [phone]

## 2012-03-10 NOTE — Progress Notes (Signed)
ANTICOAGULATION CONSULT NOTE - Initial Consult  Pharmacy Consult for coumadin Indication: atrial fibrillation  Allergies  Allergen Reactions  . Morphine Itching    Vital Signs: Temp: 97.7 F (36.5 C) (06/25 1126) Temp src: Oral (06/25 1409) BP: 151/69 mmHg (06/25 1430) Pulse Rate: 125  (06/25 1430)  Labs:  Basename 03/10/12 1215 03/10/12 1207 03/10/12 1142  HGB -- -- 16.7  HCT -- -- 49.0  PLT -- -- 184  APTT -- -- --  LABPROT 23.1* -- --  INR 2.01* -- --  HEPARINUNFRC -- -- --  CREATININE -- -- 0.80  CKTOTAL -- -- --  CKMB -- -- --  TROPONINI -- <0.30 --    Medical History: Past Medical History  Diagnosis Date  . Pneumonia 12/06/10    healthcare-associated/ left lower lobe  . COPD (chronic obstructive pulmonary disease)     severe stage IV  . Atrial fibrillation     on Coumadin with St. Jude single-chamber pacemaker  . Diabetes mellitus     type 2; s/p Prednisone therapy for pneumonia 12/2010  . Coronary artery disease     s/p anterior STEMI 09/2009; cath. revealed mid LAD 40%, distal LAD 100%- PTCA, prox. RCA 40%, mid. RCA 100% with good collateral filling of PDA; EF 25%; NSTEMI 07/2011 - PCI/DES 100% LCX  . ST elevation (STEMI) myocardial infarction 01//11/12    anterior  . CHF (congestive heart failure)     EF 25% on 09/26/10; EF 50-55% and grade 1 diastolic dysfunction on ECHO 08/2010   . Hypertension   . Hyperlipidemia   . AAA (abdominal aortic aneurysm)     3 cm infrarenal abdominal aortic aneurysm per aorta ultrasound 03/2010  . Osteoarthritis     s/p bilat hip arthroplasty  . Angina   . Ischemic cardiomyopathy     EF 35% LHC 11/12  . GERD (gastroesophageal reflux disease)   . Hypercholesteremia   . PVD (peripheral vascular disease)   . Anxiety      Assessment: 78 yo male from [place] here for CP/SOB noted with afib/rvr on coumadin PTA (home regimen= 5mg /day except 2.5mg  on MF). INR today is 2.01 and he has not taken his dose today.  Levaquin was started as outpatient but patient stated he has only taken one dose.  Goal of Therapy:  INR 2-3 Monitor platelets by anticoagulation protocol: Yes   Plan:  -Will continue coumadin at home regimen -PT/INR daily for now  Thank you for asking pharmacy to be involved in the care of this patient.  Harland German, Pharm D 03/10/2012 3:44 PM

## 2012-03-11 DIAGNOSIS — I4821 Permanent atrial fibrillation: Secondary | ICD-10-CM

## 2012-03-11 DIAGNOSIS — J441 Chronic obstructive pulmonary disease with (acute) exacerbation: Principal | ICD-10-CM

## 2012-03-11 DIAGNOSIS — I4891 Unspecified atrial fibrillation: Secondary | ICD-10-CM

## 2012-03-11 DIAGNOSIS — R079 Chest pain, unspecified: Secondary | ICD-10-CM

## 2012-03-11 DIAGNOSIS — E119 Type 2 diabetes mellitus without complications: Secondary | ICD-10-CM

## 2012-03-11 DIAGNOSIS — I5043 Acute on chronic combined systolic (congestive) and diastolic (congestive) heart failure: Secondary | ICD-10-CM

## 2012-03-11 LAB — EXPECTORATED SPUTUM ASSESSMENT W GRAM STAIN, RFLX TO RESP C

## 2012-03-11 LAB — BASIC METABOLIC PANEL
BUN: 24 mg/dL — ABNORMAL HIGH (ref 6–23)
CO2: 29 mEq/L (ref 19–32)
Calcium: 9.9 mg/dL (ref 8.4–10.5)
Creatinine, Ser: 1.04 mg/dL (ref 0.50–1.35)
Glucose, Bld: 131 mg/dL — ABNORMAL HIGH (ref 70–99)

## 2012-03-11 LAB — DIGOXIN LEVEL: Digoxin Level: 1.4 ng/mL (ref 0.8–2.0)

## 2012-03-11 LAB — CBC
HCT: 49.9 % (ref 39.0–52.0)
Hemoglobin: 16.3 g/dL (ref 13.0–17.0)
MCH: 30.8 pg (ref 26.0–34.0)
MCV: 94.3 fL (ref 78.0–100.0)
RBC: 5.29 MIL/uL (ref 4.22–5.81)

## 2012-03-11 LAB — LIPID PANEL
Cholesterol: 135 mg/dL (ref 0–200)
HDL: 32 mg/dL — ABNORMAL LOW (ref >39)
Total CHOL/HDL Ratio: 4.2 RATIO
Triglycerides: 187 mg/dL — ABNORMAL HIGH (ref <150)
VLDL: 37 mg/dL (ref 0–40)

## 2012-03-11 LAB — GLUCOSE, CAPILLARY: Glucose-Capillary: 124 mg/dL — ABNORMAL HIGH (ref 70–99)

## 2012-03-11 LAB — CARDIAC PANEL(CRET KIN+CKTOT+MB+TROPI)
CK, MB: 4.1 ng/mL — ABNORMAL HIGH (ref 0.3–4.0)
Relative Index: INVALID (ref 0.0–2.5)
Relative Index: INVALID (ref 0.0–2.5)
Total CK: 39 U/L (ref 7–232)

## 2012-03-11 LAB — PROTIME-INR: INR: 2.26 — ABNORMAL HIGH (ref 0.00–1.49)

## 2012-03-11 MED ORDER — ALBUTEROL SULFATE HFA 108 (90 BASE) MCG/ACT IN AERS
2.0000 | INHALATION_SPRAY | RESPIRATORY_TRACT | Status: DC | PRN
  Filled 2012-03-11 (×2): qty 6.7

## 2012-03-11 MED ORDER — ENSURE COMPLETE PO LIQD
237.0000 mL | Freq: Two times a day (BID) | ORAL | Status: DC
  Administered 2012-03-11 – 2012-03-19 (×16): 237 mL via ORAL

## 2012-03-11 MED ORDER — LEVALBUTEROL TARTRATE 45 MCG/ACT IN AERO
2.0000 | INHALATION_SPRAY | Freq: Four times a day (QID) | RESPIRATORY_TRACT | Status: DC | PRN
  Administered 2012-03-11 – 2012-03-15 (×12): 2 via RESPIRATORY_TRACT
  Filled 2012-03-11: qty 15

## 2012-03-11 MED ORDER — GUAIFENESIN-DM 100-10 MG/5ML PO SYRP
15.0000 mL | ORAL_SOLUTION | ORAL | Status: DC | PRN
  Administered 2012-03-11 – 2012-03-14 (×3): 15 mL via ORAL
  Filled 2012-03-11: qty 15
  Filled 2012-03-11: qty 5
  Filled 2012-03-11: qty 10
  Filled 2012-03-11: qty 15

## 2012-03-11 MED ORDER — WARFARIN SODIUM 5 MG PO TABS
5.0000 mg | ORAL_TABLET | Freq: Once | ORAL | Status: AC
  Administered 2012-03-11: 5 mg via ORAL
  Filled 2012-03-11: qty 1

## 2012-03-11 MED ORDER — METHYLPREDNISOLONE SODIUM SUCC 125 MG IJ SOLR
80.0000 mg | Freq: Four times a day (QID) | INTRAMUSCULAR | Status: DC
  Administered 2012-03-11: 80 mg via INTRAVENOUS
  Administered 2012-03-11: 17:00:00 via INTRAVENOUS
  Administered 2012-03-12 – 2012-03-13 (×5): 80 mg via INTRAVENOUS
  Filled 2012-03-11 (×8): qty 1.28

## 2012-03-11 NOTE — Progress Notes (Signed)
ANTICOAGULATION CONSULT NOTE - Follow Up Consult  Pharmacy Consult for coumadin Indication: atrial fibrillation  No Known Allergies  Vital Signs: Temp: 97.6 F (36.4 C) (06/26 0739) Temp src: Oral (06/26 0739) BP: 121/60 mmHg (06/26 0739) Pulse Rate: 100  (06/26 0739)  Labs:  Basename 03/11/12 0600 03/11/12 0001 03/10/12 1801 03/10/12 1215 03/10/12 1142  HGB 16.3 -- -- -- 16.7  HCT 49.9 -- -- -- 49.0  PLT 206 -- -- -- 184  APTT -- -- -- -- --  LABPROT 25.3* -- -- 23.1* --  INR 2.26* -- -- 2.01* --  HEPARINUNFRC -- -- -- -- --  CREATININE 1.04 -- 0.81 -- 0.80  CKTOTAL 39 39 31 -- --  CKMB 3.8 4.1* 3.6 -- --  TROPONINI <0.30 <0.30 <0.30 -- --    Medical History: Past Medical History  Diagnosis Date  . Pneumonia 12/06/10    healthcare-associated/ left lower lobe  . COPD (chronic obstructive pulmonary disease)     severe stage IV  . Atrial fibrillation     on Coumadin with St. Jude single-chamber pacemaker  . Diabetes mellitus     type 2; s/p Prednisone therapy for pneumonia 12/2010  . Coronary artery disease     s/p anterior STEMI 09/2009; cath. revealed mid LAD 40%, distal LAD 100%- PTCA, prox. RCA 40%, mid. RCA 100% with good collateral filling of PDA; EF 25%; NSTEMI 07/2011 - PCI/DES 100% LCX  . ST elevation (STEMI) myocardial infarction 01//11/12    anterior  . CHF (congestive heart failure)     EF 25% on 09/26/10; EF 50-55% and grade 1 diastolic dysfunction on ECHO 08/2010   . Hypertension   . Hyperlipidemia   . AAA (abdominal aortic aneurysm)     3 cm infrarenal abdominal aortic aneurysm per aorta ultrasound 03/2010  . Osteoarthritis     s/p bilat hip arthroplasty  . Angina   . Ischemic cardiomyopathy     EF 35% LHC 11/12  . GERD (gastroesophageal reflux disease)   . Hypercholesteremia   . PVD (peripheral vascular disease)   . Anxiety      Admit Complaint: 78 y.o.  male  admitted 03/10/2012 from [facility] here for CP/SOB noted with afib/rvr on  coumadin PTA.  Pharmacy consulted to dose warfarin  Home regimen= 5mg /day except 2.5mg  on MF  Overnight Events: 03/11/2012 none noted  Assessment: Anticoagulation: afib, INR at goal on home regimen, continue home dose, CBC stable, no bleeding noted  Infectious Disease: Purulent bronchitis, afebrile, WBC wnl Antibiotics: Levaqin 6/24 Cultures: Blood 6/25>>>  Urine 6/25>>>  Sputum 6/25>>>  Cardiovascular:Afib, CAD (STEMI 2011), HF EF25%, HTN, hyperlipidemia, AAA, PVD, pacer: bisoprolol, clopidogrel, digoxin 0.25 daily (home dose, 6/26 dig level 1.4), furo 40 BID, IMDUR, diltiazem infusion BP  at goal, HR in 100s  K 4.3,   Endocrinology: DM, on steroids for COPD, A1c 6/25 6.8, glucose <150, on no home DM meds  Gastrointestinal / Nutrition: GERD, protonix (has an Rx but not taking PTA)  Neurology/MSK: Anxiety  Nephrology/Urology/Electrolytes: Flomax  Pulmonary: COPD: Methylpred taper, guaifenesin, budesonide, xopenex PRN  PTA Medication Issues: Home Meds Not Ordered: crestor, dilt 240 daily, PO predinsone,   Best Practices: DVT Prophylaxis:  Therapeutic INR  Goal of Therapy:  INR 2-3 Monitor platelets by anticoagulation protocol: Yes   Plan:  -Will continue coumadin at home regimen -PT/INR daily for now   Thank you for allowing pharmacy to be a part of this patients care team.  Lovenia Kim Pharm.D., BCPS Clinical  Pharmacist 03/11/2012 8:49 AM Pager: [phone] Phone: [phone]

## 2012-03-11 NOTE — Progress Notes (Signed)
Patient Name: [name] Date of Encounter: 03/11/2012     Principal Problem:  *Chest pain Active Problems:  CAD (coronary artery disease)  HLD (hyperlipidemia)  COPD exacerbation  Acute on chronic combined systolic and diastolic heart failure  Hypoxemia  Pulmonary edema  Type 2 diabetes mellitus  Permanent atrial fibrillation    SUBJECTIVE: Still c/o dyspnea  OBJECTIVE  Filed Vitals:   03/11/12 0400 03/11/12 0500 03/11/12 0739 03/11/12 0944  BP: 115/71  121/60   Pulse: 97  100   Temp:   97.6 F (36.4 C)   TempSrc:   Oral   Resp: 21  18   Weight:  71.623 kg (157 lb 14.4 oz)    SpO2: 99%  96% 97%    Intake/Output Summary (Last 24 hours) at 03/11/12 1029 Last data filed at 03/11/12 0900  Gross per 24 hour  Intake  664.5 ml  Output   1100 ml  Net -435.5 ml   Weight change:   PHYSICAL EXAM  General:  Well developed, well nourished, in no acute distress. Head:  Normocephalic, atraumatic, sclera non-icteric, no xanthomas, nares are without discharge.  Neck:  Supple without bruits or JVD. Lungs:   Resp regular but somewhat labored, bilateral wheezes Heart:  IRRR no s3, s4, or murmurs. Abdomen:  Soft, non-tender, non-distended, BS + x 4.  Msk:   Strength and tone appears normal for age. Extremities:  No clubbing, cyanosis or edema. DP/PT/Radials 2+ and equal bilaterally. Neuro:  Alert and oriented X 3. Moves all extremities spontaneously. Psych:  Normal affect.  LABS:  Recent Labs  Basename 03/11/12 0600 03/10/12 1142   WBC 10.4 10.0   HGB 16.3 16.7   HCT 49.9 49.0   MCV 94.3 92.1   PLT 206 184   No results found for this basename: VITAMINB12,FOLATE,FERRITIN,TIBC,IRON,RETICCTPCT in the last 72 hours No results found for this basename: DDIMER:2 in the last 72 hours  Lab 03/11/12 0600 03/10/12 1801 03/10/12 1142  NA 139 139 137  K 4.3 3.8 3.9  CL 96 101 99  CO2 29 27 27   BUN 24* 21 22  CREATININE 1.04 0.81 0.80  CALCIUM 9.9 9.8 9.9  PROT --  6.6 --  BILITOT -- 0.4 --  ALKPHOS -- 62 --  ALT -- 20 --  AST -- 18 --  AMYLASE -- -- --  LIPASE -- -- --  GLUCOSE 131* 127* 119*   Recent Labs  Basename 03/10/12 1801   HGBA1C 6.8*   Recent Labs  Basename 03/11/12 0600 03/11/12 0001 03/10/12 1801   CKTOTAL 39 39 31   CKMB 3.8 4.1* 3.6   CKMBINDEX -- -- --   TROPONINI <0.30 <0.30 <0.30   No components found with this basename: POCBNP Recent Labs  Basename 03/11/12 0600   CHOL 135   HDL 32*   LDLCALC 66   TRIG 187*   CHOLHDL 4.2   LDLDIRECT --    Basename 03/10/12 1801  TSH 0.616  T4TOTAL --  T3FREE --  THYROIDAB --     TELE - atrial fib with a controlled VR   Radiology/Studies:  Dg Chest 2 View  03/10/2012  *RADIOLOGY REPORT*  Clinical Data: Cough and chest pain  CHEST - 2 VIEW  Comparison: 11/07/2011  Findings: Left subclavian single lead pacemaker device and leads are stable.  Lungs are hyperaerated without pneumothorax.  Mild cardiomegaly.  Normal vascularity.  No pleural effusion.  Chronic changes at the right lung base.  IMPRESSION: Cardiomegaly.  Changes related to COPD.  Original Report Authenticated By: Donavan Burnet, M.D.    Current Medications:     . aspirin  324 mg Oral NOW   Or  . aspirin  300 mg Rectal NOW  . bisoprolol  5 mg Oral Daily  . budesonide  0.25 mg Nebulization QID  . Chlorhexidine Gluconate Cloth  6 each Topical Q0600  . cholecalciferol  2,000 Units Oral Daily  . clopidogrel  75 mg Oral Q breakfast  . digoxin  250 mcg Oral Daily  . diltiazem (CARDIZEM) infusion  5-15 mg/hr Intravenous Once  . diltiazem (CARDIZEM) infusion  5-15 mg/hr Intravenous Once  . furosemide  40 mg Oral BID  . guaiFENesin  1,200 mg Oral BID  . isosorbide mononitrate  60 mg Oral Daily  . levofloxacin (LEVAQUIN) IV  750 mg Intravenous Q24H  . methylPREDNISolone (SOLU-MEDROL) injection  125 mg Intravenous Daily   Followed by  . methylPREDNISolone (SOLU-MEDROL) injection  60 mg Intravenous Q6H  .  mupirocin ointment  1 application Nasal BID  . pantoprazole  40 mg Oral Q1200  . senna  2 tablet Oral BID  . sodium chloride  3 mL Intravenous Q12H  . Tamsulosin HCl  0.4 mg Oral QPC supper  . warfarin  5 mg Oral ONCE-1800  . warfarin  5 mg Oral ONCE-1800  . Warfarin - Pharmacist Dosing Inpatient   Does not apply q1800  . DISCONTD: aspirin  325 mg Oral STAT  . DISCONTD: budesonide  0.25 mg Nebulization Q6H  . DISCONTD: clopidogrel  75 mg Oral Q breakfast  . DISCONTD: levofloxacin  500 mg Oral Daily  . DISCONTD: Potassium Gluconate  1 tablet Oral Daily  . DISCONTD: predniSONE  20 mg Oral Q breakfast  . DISCONTD: Vitamin D3  2,000 Units Oral Daily  . DISCONTD: warfarin  5 mg Oral UD    ASSESSMENT AND PLAN: 1. Atrial fib with a controlled VR - his rate is improved but still elevated. Will continue IV cardizem. Will require improvement in pulmonary toilet before his rate is easier to control. 2. CAD - no angina currently. R/O for MI. 3. COPD - appreciate CCM help with steroid, anti-biotics and bronchodilators.    Lewayne Bunting, M.D. 03/11/2012, 10:29 AM

## 2012-03-11 NOTE — Progress Notes (Signed)
Name: [name] MRN: [id number] DOB: 11/13/1927    LOS: 1  Referring Provider:  Dr. 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  Was seen in office on 6/20 and given a prednisone taper then followed up on 6/24 where he was noted to have purulent sputum.  Levaquin was called for the patient.  The patient reports to the ED on 6/25 with chest pain and dyspnea.  In the ED the patient was noted new onset a-fib.  PCCM was consulted for COPD exacerbation management.  Past Medical History  Diagnosis Date  . Pneumonia 12/06/10    healthcare-associated/ left lower lobe  . COPD (chronic obstructive pulmonary disease)     severe stage IV  . Atrial fibrillation     on Coumadin with St. Jude single-chamber pacemaker  . Diabetes mellitus     type 2; s/p Prednisone therapy for pneumonia 12/2010  . Coronary artery disease     s/p anterior STEMI 09/2009; cath. revealed mid LAD 40%, distal LAD 100%- PTCA, prox. RCA 40%, mid. RCA 100% with good collateral filling of PDA; EF 25%; NSTEMI 07/2011 - PCI/DES 100% LCX  . ST elevation (STEMI) myocardial infarction 01//11/12    anterior  . CHF (congestive heart failure)     EF 25% on 09/26/10; EF 50-55% and grade 1 diastolic dysfunction on ECHO 08/2010   . Hypertension   . Hyperlipidemia   . AAA (abdominal aortic aneurysm)     3 cm infrarenal abdominal aortic aneurysm per aorta ultrasound 03/2010  . Osteoarthritis     s/p bilat hip arthroplasty  . Angina   . Ischemic cardiomyopathy     EF 35% LHC 11/12  . GERD (gastroesophageal reflux disease)   . Hypercholesteremia   . PVD (peripheral vascular disease)   . Anxiety     Current Status: Had a rough night, needing nebs q 4-6h  Vital Signs: Temp:  [97.6 F (36.4 C)-98.1 F (36.7 C)] 97.6 F (36.4 C) (06/26 0739) Pulse Rate:  [47-149] 100   (06/26 0739) Resp:  [15-35] 18  (06/26 0739) BP: (115-179)/(60-133) 121/60 mmHg (06/26 0739) SpO2:  [94 %-100 %] 97 % (06/26 0944) Weight:  [71.623 kg (157 lb 14.4 oz)] 71.623 kg (157 lb 14.4 oz) (06/26 0500)  Physical Examination: General:  Chronically ill appearing elderly male. Neuro:  Alert and oriented, moving all ext to command. HEENT:  [place]/AT, PERRL, EOM-I and DMM. Neck:  Supple, -LAN and -thyromegally. Cardiovascular:  IRIR, Nl S1/S2, -M/R/G. Lungs:  Distant BS, diffuse end exp wheezes. Abdomen:  Soft, NT, ND and +BS. Musculoskeletal:  -edema and -tenderness, - clubbing. Skin:  Thing but intact.  Principal Problem:  *Chest pain Active Problems:  CAD (coronary artery disease)  HLD (hyperlipidemia)  COPD exacerbation  Acute on chronic combined systolic and diastolic heart failure  Hypoxemia  Pulmonary edema  Type 2 diabetes mellitus  Permanent atrial fibrillation   ASSESSMENT AND PLAN  PULMONARY   CXR:  Cardiomegally and COPD, mild pulmonary edema. ETT:  None  A:  78 year old male with severe COPD presenting with COPD exacerbation and a-fib with RVR. P:   Solumedrol.80 q 6h Levaquin (patient has not been exposed to health care settings since February and levaquin was just started on 6/24 so will not treat as resistance as an issue). PRN  xopenex. Nebulized budesonide. Discussed adding low dose morphine for dyspnea if persists DNR noted Rescue MDI at bedside  CARDIOVASCULAR  Lab 03/11/12 0600 03/11/12 0001 03/10/12 1801 03/10/12 1207  TROPONINI <0.30 <0.30 <0.30 <0.30  LATICACIDVEN -- -- -- --  PROBNP -- -- 2962.0* --   ECG:  A-fib. Lines: PIV.  A: CHF and a-fib history. P:  Cardiazem drip. Would only use zebeta for beta blockers.  RENAL  Lab 03/11/12 0600 03/10/12 1801 03/10/12 1142  NA 139 139 137  K 4.3 3.8 --  CL 96 101 99  CO2 29 27 27   BUN 24* 21 22  CREATININE 1.04 0.81 0.80  CALCIUM 9.9 9.8 9.9  MG -- 2.3 --  PHOS -- -- --    Intake/Output      06/25 0701 - 06/26 0700 06/26 0701 - 06/27 0700   P.O. 120 120   I.V. (mL/kg) 244.5 (3.4) 30 (0.4)   IV Piggyback 150    Total Intake(mL/kg) 514.5 (7.2) 150 (2.1)   Urine (mL/kg/hr) 1100 (0.6)    Total Output 1100    Net -585.5 +150         Foley:  None  A:  No active issues. P:   F/U BMET as patient is being diuresed.  HEMATOLOGIC  Lab 03/11/12 0600 03/10/12 1215 03/10/12 1142  HGB 16.3 -- 16.7  HCT 49.9 -- 49.0  PLT 206 -- 184  INR 2.26* 2.01* --  APTT -- -- --   A:  INR 2 due to coumadin. P:  Coumadin per pharmacy.  INFECTIOUS  Lab 03/11/12 0600 03/10/12 1142  WBC 10.4 10.0  PROCALCITON -- --   Cultures: Blood 6/25>>> Urine 6/25>>> Sputum 6/25>>> Antibiotics: Levaquin 6/24>>>  A:  Purulent bronchitis. P:   Pan culture. Levaquin.  ENDOCRINE  Lab 03/11/12 0734  GLUCAP 124*   A:  No diabetes history.   P:   Follow sugars while on steroids.  PCCM will follow .Updated pt & daughter  Oretha Milch., M.D. Pulmonary and Critical Care Medicine [facility] Pager: [phone] 03/11/2012, 11:06 AM

## 2012-03-11 NOTE — Progress Notes (Signed)
INITIAL ADULT NUTRITION ASSESSMENT Date: 03/11/2012   Time: 11:15 AM Reason for Assessment: Nutrition risk   ASSESSMENT: Male 78 y.o.  Dx: Chest pain  Hx:  Past Medical History  Diagnosis Date  . Pneumonia 12/06/10    healthcare-associated/ left lower lobe  . COPD (chronic obstructive pulmonary disease)     severe stage IV  . Atrial fibrillation     on Coumadin with St. Jude single-chamber pacemaker  . Diabetes mellitus     type 2; s/p Prednisone therapy for pneumonia 12/2010  . Coronary artery disease     s/p anterior STEMI 09/2009; cath. revealed mid LAD 40%, distal LAD 100%- PTCA, prox. RCA 40%, mid. RCA 100% with good collateral filling of PDA; EF 25%; NSTEMI 07/2011 - PCI/DES 100% LCX  . ST elevation (STEMI) myocardial infarction 01//11/12    anterior  . CHF (congestive heart failure)     EF 25% on 09/26/10; EF 50-55% and grade 1 diastolic dysfunction on ECHO 08/2010   . Hypertension   . Hyperlipidemia   . AAA (abdominal aortic aneurysm)     3 cm infrarenal abdominal aortic aneurysm per aorta ultrasound 03/2010  . Osteoarthritis     s/p bilat hip arthroplasty  . Angina   . Ischemic cardiomyopathy     EF 35% LHC 11/12  . GERD (gastroesophageal reflux disease)   . Hypercholesteremia   . PVD (peripheral vascular disease)   . Anxiety     Related Meds:     . aspirin  324 mg Oral NOW   Or  . aspirin  300 mg Rectal NOW  . bisoprolol  5 mg Oral Daily  . budesonide  0.25 mg Nebulization QID  . Chlorhexidine Gluconate Cloth  6 each Topical Q0600  . cholecalciferol  2,000 Units Oral Daily  . clopidogrel  75 mg Oral Q breakfast  . digoxin  250 mcg Oral Daily  . diltiazem (CARDIZEM) infusion  5-15 mg/hr Intravenous Once  . diltiazem (CARDIZEM) infusion  5-15 mg/hr Intravenous Once  . furosemide  40 mg Oral BID  . guaiFENesin  1,200 mg Oral BID  . isosorbide mononitrate  60 mg Oral Daily  . levofloxacin (LEVAQUIN) IV  750 mg Intravenous Q24H  . methylPREDNISolone  (SOLU-MEDROL) injection  125 mg Intravenous Daily   Followed by  . methylPREDNISolone (SOLU-MEDROL) injection  60 mg Intravenous Q6H  . mupirocin ointment  1 application Nasal BID  . pantoprazole  40 mg Oral Q1200  . senna  2 tablet Oral BID  . sodium chloride  3 mL Intravenous Q12H  . Tamsulosin HCl  0.4 mg Oral QPC supper  . warfarin  5 mg Oral ONCE-1800  . warfarin  5 mg Oral ONCE-1800  . Warfarin - Pharmacist Dosing Inpatient   Does not apply q1800  . DISCONTD: aspirin  325 mg Oral STAT  . DISCONTD: budesonide  0.25 mg Nebulization Q6H  . DISCONTD: clopidogrel  75 mg Oral Q breakfast  . DISCONTD: levofloxacin  500 mg Oral Daily  . DISCONTD: Potassium Gluconate  1 tablet Oral Daily  . DISCONTD: predniSONE  20 mg Oral Q breakfast  . DISCONTD: Vitamin D3  2,000 Units Oral Daily  . DISCONTD: warfarin  5 mg Oral UD     Ht:   Ht Readings from Last 3 Encounters:  03/05/12 5\' 9"  (1.753 m)  02/04/12 5\' 9"  (1.753 m)  12/10/11 5\' 9"  (1.753 m)     Wt: 157 lb 14.4 oz (71.623 kg)  Ideal Wt:  72.7 kg  % Ideal Wt: 98%  Usual Wt: 170-172 lbs per pt report Wt Readings from Last 10 Encounters:  03/11/12 157 lb 14.4 oz (71.623 kg)  03/05/12 172 lb 9.6 oz (78.291 kg)  02/04/12 175 lb 9.6 oz (79.652 kg)  12/10/11 169 lb 9.6 oz (76.93 kg)  11/13/11 154 lb 4.8 oz (69.99 kg)  11/07/11 162 lb (73.483 kg)  11/04/11 166 lb 6.4 oz (75.479 kg)  10/11/11 179 lb 1.9 oz (81.248 kg)  09/26/11 177 lb 11.1 oz (80.6 kg)  09/20/11 182 lb (82.555 kg)    % Usual Wt: 91%  Body mass index is 23.32 kg/(m^2). Pt is WNL   Food/Nutrition Related Hx: pt reports limited intake x 1 week and weight loss  Labs:  CMP     Component Value Date/Time   NA 139 03/11/2012 0600   K 4.3 03/11/2012 0600   CL 96 03/11/2012 0600   CO2 29 03/11/2012 0600   GLUCOSE 131* 03/11/2012 0600   BUN 24* 03/11/2012 0600   CREATININE 1.04 03/11/2012 0600   CALCIUM 9.9 03/11/2012 0600   PROT 6.6 03/10/2012 1801   ALBUMIN 3.8  03/10/2012 1801   AST 18 03/10/2012 1801   ALT 20 03/10/2012 1801   ALKPHOS 62 03/10/2012 1801   BILITOT 0.4 03/10/2012 1801   GFRNONAA 65* 03/11/2012 0600   GFRAA 76* 03/11/2012 0600    Intake/Output Summary (Last 24 hours) at 03/11/12 1118 Last data filed at 03/11/12 0900  Gross per 24 hour  Intake  664.5 ml  Output   1100 ml  Net -435.5 ml     Diet Order: Cardiac  Supplements/Tube Feeding: none  IVF:    diltiazem (CARDIZEM) infusion Last Rate: 5 mg/hr (03/10/12 2000)    Estimated Nutritional Needs:   Kcal: 1750-1900  Protein: 75-85 gm  Fluid: 1.8-2 L   Pt reports that over the last week has has been limited on intake r/t increased WOB and loss of appetite. Pt reports <50% usual intake, sometimes only able to eat a few crackers through out the whole day. In the last 6 days pt has lost 15 lbs, or 8.7% body weight, severe weight loss.  Pt meets criteria for severe malnutrition in the context of acute illness 2/2 to 8.7% weight loss in 6 days and reported intake <50% for 1 week.  Pt is agreeable to Ensure BID between meals for increased kcal and protein intake.   NUTRITION DIAGNOSIS: -Inadequate oral intake (NI-2.1).  Status: Ongoing  RELATED TO: increased WOB, SOB, poor appetite   AS EVIDENCE BY: weight loss of 8.7% in 6 days   MONITORING/EVALUATION(Goals): Goal: PO intake to promote weight stabilization  Monitor: PO intake, weight, labs, I/O's  EDUCATION NEEDS: -No education needs identified at this time  INTERVENTION: 1. Add Ensure Complete BID between meals  2. RD will continue to follow    DOCUMENTATION CODES Per approved criteria  -Non-severe (moderate) malnutrition in the context of acute illness or injury   Clarene Duke RD, LDN Pager [phone]  Clarene Duke MARIE 03/11/2012, 11:15 AM

## 2012-03-11 NOTE — Clinical Documentation Improvement (Signed)
MALNUTRITION DOCUMENTATION CLARIFICATION  THIS DOCUMENT IS NOT A PERMANENT PART OF THE MEDICAL RECORD  TO RESPOND TO THE THIS QUERY, FOLLOW THE INSTRUCTIONS BELOW:  1. If needed, update documentation for the patient's encounter via the notes activity.  2. Access this query again and click edit on the In [employer].  3. After updating, or not, click F2 to complete all highlighted (required) fields concerning your review. Select "additional documentation in the medical record" OR "no additional documentation provided".  4. Click Sign note button.  5. The deficiency will fall out of your In Basket *Please let us know if you are not able to complete this workflow by phone or e-mail (listed below).  Please update your documentation within the medical record to reflect your response to this query.                                                                                        03/11/12   Dear [facility],  In a better effort to capture your patient's severity of illness, reflect appropriate length of stay and utilization of resources, a review of the patient medical record has revealed the following indicators.Based on your clinical judgment, please clarify and document in a progress note and/or discharge summary the clinical condition associated with the following supporting information:In responding to this query please exercise your independent judgment.  The fact that a query is asked, does not imply that any particular answer is desired or expected. Possible Clinical Conditions?  Mild Malnutrition  Moderate Malnutrition Severe Malnutrition   Protein Calorie Malnutrition Severe Protein Calorie Malnutrition   Other Condition________________ Cannot clinically determine   Supporting Information: Risk Factors: Pt reports that over the last week has has been limited on intake r/t increased WOB and loss of appetite Pt reports <50% usual intake, sometimes only able to eat a  few crackers through out the whole day..  Signs & Symptoms: Pt meets criteria for severe malnutrition in the context of acute illness 2/2 to 8.7% weight loss in 6 days and reported intake <50% for 1 week.   Ht 5'9     Wt 157 BMI: 23  Weight  Loss:  In the last 6 days pt has lost 15 lbs, or 8.7% body weight, severe weight loss  Diagnostics:  Albumin level:  3.8 Total Protein: 6.6 Calcium level: 9.9  Treatment  Nutrition Consult.  Pt is agreeable to Ensure BID between meals for increased kcal and protein intake  You may use possible, probable, or suspect with inpatient documentation. possible, probable, suspected diagnoses MUST be documented at the time of discharge  Reviewed:  no additional documentation provided - cardiology is primary svc  Thank You,  Amada Kingfisher RN, BSN, CCM  Clinical Documentation Specialist: [phone] [email or web address]   Health Information Management [place]

## 2012-03-11 NOTE — Care Management Note (Addendum)
    Page 1 of 1   03/13/2012     2:11:03 PM   CARE MANAGEMENT NOTE 03/13/2012  Patient:  [name], [name]   Account Number:  [id number]  Date Initiated:  03/11/2012  Documentation initiated by:  Junius Creamer  Subjective/Objective Assessment:   adm w at fib     Action/Plan:   lives indep at [facility], pcp dr Buren Kos   Anticipated DC Date:     Anticipated DC Plan:  HOME W HOSPICE CARE  In-house referral  Clinical Social Worker      DC Associate Professor  CM consult      [facility] Choice  HOSPICE   Choice offered to / List presented to:  C-1 Patient        HH arranged  HH-1 RN  HH-6 SOCIAL WORKER      HH agency  HOSPICE   Status of service:   Medicare Important Message given?   (If response is "NO", the following Medicare IM given date fields will be blank) Date Medicare IM given:   Date Additional Medicare IM given:    Discharge Disposition:  HOME W HOSPICE CARE  Per UR Regulation:  Reviewed for med. necessity/level of care/duration of stay  If discussed at Long Length of Stay Meetings, dates discussed:    Comments:  6/28 14:08p debbie [name] rn,bsn met w hosp rep-patient-da in room. da would like to start working on motorized w/c and will need prescription to get this started. it may take awhile to get thru medicare red tape for motorized w/c. she and pt now thinking about short term snf for rehab prior to returning to indep vs assisted living at [facility]. they prefer [name], he has been there before for rehab. sw ref made.  6/28 11:35a debbie Esteven Overfelt rn,bsn spoke w pt. he plans to return to [facility] indep living hopefully. he has hospice ref and gave pt [facility] hospice agency list.he has worked w tommie np at [facility] and would like them again for [facility] hospice. ref to [facility] for [facility] hospice. pt has neb and rolator but may need home o2. will cont to follow.  6/26 8:50a debbie Aleysha Meckler rn,bsn [phone]

## 2012-03-12 LAB — URINE CULTURE
Colony Count: NO GROWTH
Culture: NO GROWTH

## 2012-03-12 MED ORDER — FUROSEMIDE 10 MG/ML IJ SOLN
40.0000 mg | Freq: Every day | INTRAMUSCULAR | Status: DC
  Filled 2012-03-12 (×3): qty 4

## 2012-03-12 MED ORDER — MORPHINE SULFATE 10 MG/5ML PO SOLN
5.0000 mg | ORAL | Status: DC | PRN
  Administered 2012-03-12 – 2012-03-13 (×4): 5 mg via ORAL
  Filled 2012-03-12 (×4): qty 5

## 2012-03-12 MED ORDER — WARFARIN SODIUM 2.5 MG PO TABS
2.5000 mg | ORAL_TABLET | Freq: Once | ORAL | Status: AC
  Administered 2012-03-12: 2.5 mg via ORAL
  Filled 2012-03-12: qty 1

## 2012-03-12 NOTE — Progress Notes (Signed)
ANTICOAGULATION CONSULT NOTE - Follow Up Consult  Pharmacy Consult for coumadin Indication: atrial fibrillation  No Known Allergies  Vital Signs: Temp: 97.6 F (36.4 C) (06/27 0735) Temp src: Oral (06/27 0735) BP: 123/95 mmHg (06/27 0735) Pulse Rate: 98  (06/27 0735)  Labs:  Basename 03/12/12 0458 03/11/12 0600 03/11/12 0001 03/10/12 1801 03/10/12 1215 03/10/12 1142  HGB -- 16.3 -- -- -- 16.7  HCT -- 49.9 -- -- -- 49.0  PLT -- 206 -- -- -- 184  APTT -- -- -- -- -- --  LABPROT 30.4* 25.3* -- -- 23.1* --  INR 2.85* 2.26* -- -- 2.01* --  HEPARINUNFRC -- -- -- -- -- --  CREATININE -- 1.04 -- 0.81 -- 0.80  CKTOTAL -- 39 39 31 -- --  CKMB -- 3.8 4.1* 3.6 -- --  TROPONINI -- <0.30 <0.30 <0.30 -- --    Medical History: Past Medical History  Diagnosis Date  . Pneumonia 12/06/10    healthcare-associated/ left lower lobe  . COPD (chronic obstructive pulmonary disease)     severe stage IV  . Atrial fibrillation     on Coumadin with St. Jude single-chamber pacemaker  . Diabetes mellitus     type 2; s/p Prednisone therapy for pneumonia 12/2010  . Coronary artery disease     s/p anterior STEMI 09/2009; cath. revealed mid LAD 40%, distal LAD 100%- PTCA, prox. RCA 40%, mid. RCA 100% with good collateral filling of PDA; EF 25%; NSTEMI 07/2011 - PCI/DES 100% LCX  . ST elevation (STEMI) myocardial infarction 01//11/12    anterior  . CHF (congestive heart failure)     EF 25% on 09/26/10; EF 50-55% and grade 1 diastolic dysfunction on ECHO 08/2010   . Hypertension   . Hyperlipidemia   . AAA (abdominal aortic aneurysm)     3 cm infrarenal abdominal aortic aneurysm per aorta ultrasound 03/2010  . Osteoarthritis     s/p bilat hip arthroplasty  . Angina   . Ischemic cardiomyopathy     EF 35% LHC 11/12  . GERD (gastroesophageal reflux disease)   . Hypercholesteremia   . PVD (peripheral vascular disease)   . Anxiety      Admit Complaint: 78 y.o.  male  admitted 03/10/2012 from  [facility] here for CP/SOB noted with afib/rvr on coumadin PTA.  Pharmacy consulted to dose warfarin  Home regimen= 5mg /day except 2.5mg  on MF  Assessment: Anticoagulation: afib, INR cont to trend up. This could be due to abx. CBC stable, no bleeding noted  Infectious Disease: Purulent bronchitis, afebrile, WBC wnl Antibiotics: Levaqin 6/24>> Cultures: Blood 6/25>>>  Urine 6/25>>>  Sputum 6/25>>>  Cardiovascular:Afib, CAD (STEMI 2011), HF EF25%, HTN, hyperlipidemia, AAA, PVD, pacer: bisoprolol, clopidogrel, digoxin 0.25 daily (home dose, 6/26 dig level 1.4), furo 40 BID, IMDUR, diltiazem infusion BP  at goal, HR 98  K 4.3,   Endocrinology: DM, on steroids for COPD, A1c 6/25 6.8, glucose <150, on no home DM meds  Gastrointestinal / Nutrition: GERD, protonix (has an Rx but not taking PTA)  Neurology/MSK: Anxiety  Nephrology/Urology/Electrolytes: Flomax  Pulmonary: COPD: Methylpred taper, guaifenesin, budesonide, xopenex PRN  PTA Medication Issues: Home Meds Not Ordered: crestor, dilt 240 daily, PO predinsone,   Best Practices: DVT Prophylaxis:  Therapeutic INR  Goal of Therapy:  INR 2-3 Monitor platelets by anticoagulation protocol: Yes   Plan:  -Coumadin 2.5mg  PO x1 today -Daily INR

## 2012-03-12 NOTE — Progress Notes (Signed)
Name: [name] MRN: [id number] DOB: December 10, 1927    LOS: 2  Referring Provider:  Dr. 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  Was seen in office on 6/20 and given a prednisone taper then followed up on 6/24 where he was noted to have purulent sputum.  Levaquin was called for the patient.  The patient reports to the ED on 6/25 with chest pain and dyspnea.  In the ED the patient was noted new onset a-fib.  PCCM was consulted for COPD exacerbation management.  Past Medical History  Diagnosis Date  . Pneumonia 12/06/10    healthcare-associated/ left lower lobe  . COPD (chronic obstructive pulmonary disease)     severe stage IV  . Atrial fibrillation     on Coumadin with St. Jude single-chamber pacemaker  . Diabetes mellitus     type 2; s/p Prednisone therapy for pneumonia 12/2010  . Coronary artery disease     s/p anterior STEMI 09/2009; cath. revealed mid LAD 40%, distal LAD 100%- PTCA, prox. RCA 40%, mid. RCA 100% with good collateral filling of PDA; EF 25%; NSTEMI 07/2011 - PCI/DES 100% LCX  . ST elevation (STEMI) myocardial infarction 01//11/12    anterior  . CHF (congestive heart failure)     EF 25% on 09/26/10; EF 50-55% and grade 1 diastolic dysfunction on ECHO 08/2010   . Hypertension   . Hyperlipidemia   . AAA (abdominal aortic aneurysm)     3 cm infrarenal abdominal aortic aneurysm per aorta ultrasound 03/2010  . Osteoarthritis     s/p bilat hip arthroplasty  . Angina   . Ischemic cardiomyopathy     EF 35% LHC 11/12  . GERD (gastroesophageal reflux disease)   . Hypercholesteremia   . PVD (peripheral vascular disease)   . Anxiety     Current Status: Had a rough night again, needing nebs q 4-6h & rescue inhaler  Vital Signs: Temp:  [96.6 F (35.9 C)-98.2 F (36.8 C)] 97.6 F (36.4 C) (06/27  0735) Pulse Rate:  [66-108] 98  (06/27 0735) Resp:  [15-33] 26  (06/27 0735) BP: (120-180)/(69-95) 123/95 mmHg (06/27 0735) SpO2:  [95 %-100 %] 98 % (06/27 0851) Weight:  [71.804 kg (158 lb 4.8 oz)] 71.804 kg (158 lb 4.8 oz) (06/27 0500)  Physical Examination: General:  Chronically ill appearing elderly male. Neuro:  Alert and oriented, moving all ext to command. HEENT:  [place]/AT, PERRL, EOM-I and DMM. Neck:  Supple, -LAN and -thyromegally. Cardiovascular:  IRIR, Nl S1/S2, -M/R/G. Lungs:  Distant BS, diffuse end exp wheezes. Abdomen:  Soft, NT, ND and +BS. Musculoskeletal:  -edema and -tenderness, - clubbing. Skin:  Thing but intact.  Principal Problem:  *Chest pain Active Problems:  CAD (coronary artery disease)  HLD (hyperlipidemia)  COPD exacerbation  Acute on chronic combined systolic and diastolic heart failure  Hypoxemia  Pulmonary edema  Type 2 diabetes mellitus  Permanent atrial fibrillation   ASSESSMENT AND PLAN  PULMONARY   CXR:  Cardiomegally and COPD, mild pulmonary edema. ETT:  None  A:  78 year old male with severe COPD presenting with COPD exacerbation and a-fib with RVR. P:   Solumedrol.80 q 6h Levaquin (patient has not been exposed to health care settings since February and levaquin was just started on 6/24 so will not treat as resistance  as an issue). PRN xopenex. Nebulized budesonide. - add 5 mg morphine for dyspnea if persists & titrate to effect DNR noted Rescue xopenex MDI at bedside  CARDIOVASCULAR  Lab 03/11/12 0600 03/11/12 0001 03/10/12 1801 03/10/12 1207  TROPONINI <0.30 <0.30 <0.30 <0.30  LATICACIDVEN -- -- -- --  PROBNP -- -- 2962.0* --   ECG:  A-fib. Lines: PIV.  A: CHF and a-fib history. P:  Cardizem drip - change to PO?Marland Kitchen Would only use zebeta for beta blockers.  RENAL  Lab 03/11/12 0600 03/10/12 1801 03/10/12 1142  NA 139 139 137  K 4.3 3.8 --  CL 96 101 99  CO2 29 27 27   BUN 24* 21 22  CREATININE 1.04 0.81 0.80   CALCIUM 9.9 9.8 9.9  MG -- 2.3 --  PHOS -- -- --   Intake/Output      06/26 0701 - 06/27 0700 06/27 0701 - 06/28 0700   P.O. 417    I.V. (mL/kg) 336.8 (4.7) 40 (0.6)   IV Piggyback     Total Intake(mL/kg) 753.8 (10.5) 40 (0.6)   Urine (mL/kg/hr) 1775 (1)    Total Output 1775    Net -1021.3 +40         Foley:  None  A:  No active issues. P:   F/U BMET as patient is being diuresed.  HEMATOLOGIC  Lab 03/12/12 0458 03/11/12 0600 03/10/12 1215 03/10/12 1142  HGB -- 16.3 -- 16.7  HCT -- 49.9 -- 49.0  PLT -- 206 -- 184  INR 2.85* 2.26* 2.01* --  APTT -- -- -- --   A:  INR 2 due to coumadin. P:  Coumadin per pharmacy.  INFECTIOUS  Lab 03/11/12 0600 03/10/12 1142  WBC 10.4 10.0  PROCALCITON -- --   Cultures: Blood 6/25>>>ng Urine 6/25>>>ng Sputum 6/25>>> Antibiotics: Levaquin 6/24>>>  A:  Purulent bronchitis. P:   Levaquin.  ENDOCRINE  Lab 03/11/12 0734  GLUCAP 124*   A:  No diabetes history.   P:   Follow sugars while on steroids.  OK to transfer to tele once off drip .Updated pt & daughter  Oretha Milch., M.D. Pulmonary and Critical Care Medicine [facility] Pager: [phone] 03/12/2012, 9:51 AM

## 2012-03-12 NOTE — Progress Notes (Signed)
Patient ID: [name], male   DOB: Nov 22, 1927, 78 y.o.   MRN: [id number] Subjective:  No chest pain. " I still can't breathe!"  Objective:  Vital Signs in the last 24 hours: Temp:  [97.4 F (36.3 C)-98.3 F (36.8 C)] 98.3 F (36.8 C) (06/27 1600) Pulse Rate:  [66-108] 86  (06/27 1600) Resp:  [15-33] 22  (06/27 1600) BP: (120-180)/(69-95) 148/79 mmHg (06/27 1600) SpO2:  [95 %-99 %] 98 % (06/27 1617) Weight:  [158 lb 4.8 oz (71.804 kg)] 158 lb 4.8 oz (71.804 kg) (06/27 0500)  Intake/Output from previous day: 06/26 0701 - 06/27 0700 In: 753.8 [P.O.:417; I.V.:336.8] Out: 1775 [Urine:1775] Intake/Output from this shift: Total I/O In: 607 [P.O.:437; I.V.:170] Out: -   Physical Exam: Anxious appearing NAD HEENT: Unremarkable Neck:  No JVD, no thyromegally Lungs:  Clear with scattered expiratory wheezes. No increased work of breathing HEART:  IRegular rate rhythm, no murmurs, no rubs, no clicks Abd:  Flat, positive bowel sounds, no organomegally, no rebound, no guarding Ext:  2 plus pulses, no edema, no cyanosis, no clubbing Skin:  No rashes no nodules Neuro:  CN II through XII intact, motor grossly intact  Lab Results:  Basename 03/11/12 0600 03/10/12 1142  WBC 10.4 10.0  HGB 16.3 16.7  PLT 206 184    Basename 03/11/12 0600 03/10/12 1801  NA 139 139  K 4.3 3.8  CL 96 101  CO2 29 27  GLUCOSE 131* 127*  BUN 24* 21  CREATININE 1.04 0.81    Basename 03/11/12 0600 03/11/12 0001  TROPONINI <0.30 <0.30   Hepatic Function Panel  Basename 03/10/12 1801  PROT 6.6  ALBUMIN 3.8  AST 18  ALT 20  ALKPHOS 62  BILITOT 0.4  BILIDIR --  IBILI --    Basename 03/11/12 0600  CHOL 135   No results found for this basename: PROTIME in the last 72 hours  Imaging: No results found.  Cardiac Studies: Tele - atrial fib with a controlled VR Assessment/Plan:  1. Atrial fib 2. Dyspnea, multi-factorial 3. COPD 4. CAD Rec: Will switch lasix to IV though I suspect much  of his problem is due to COPD exacerbation and anxiety. Followup electrolytes.  LOS: 2 days    Buel Ream.D. 03/12/2012, 6:17 PM

## 2012-03-13 LAB — PROTIME-INR
INR: 4.07 — ABNORMAL HIGH (ref 0.00–1.49)
Prothrombin Time: 40.1 seconds — ABNORMAL HIGH (ref 11.6–15.2)

## 2012-03-13 LAB — CBC
HCT: 44.7 % (ref 39.0–52.0)
Hemoglobin: 15.1 g/dL (ref 13.0–17.0)
MCH: 30.8 pg (ref 26.0–34.0)
MCHC: 33.8 g/dL (ref 30.0–36.0)
MCV: 91.2 fL (ref 78.0–100.0)
Platelets: 184 10*3/uL (ref 150–400)
RBC: 4.9 MIL/uL (ref 4.22–5.81)
RDW: 12.9 % (ref 11.5–15.5)
WBC: 15 10*3/uL — ABNORMAL HIGH (ref 4.0–10.5)

## 2012-03-13 LAB — CULTURE, RESPIRATORY W GRAM STAIN

## 2012-03-13 LAB — BASIC METABOLIC PANEL
BUN: 32 mg/dL — ABNORMAL HIGH (ref 6–23)
Chloride: 92 mEq/L — ABNORMAL LOW (ref 96–112)
GFR calc Af Amer: >90 mL/min (ref >90)
Potassium: 3.9 mEq/L (ref 3.5–5.1)
Sodium: 133 mEq/L — ABNORMAL LOW (ref 135–145)

## 2012-03-13 MED ORDER — MORPHINE SULFATE 15 MG PO TABS
7.5000 mg | ORAL_TABLET | Freq: Two times a day (BID) | ORAL | Status: DC
  Administered 2012-03-13 – 2012-03-17 (×9): 7.5 mg via ORAL
  Filled 2012-03-13 (×9): qty 1

## 2012-03-13 MED ORDER — MORPHINE SULFATE 10 MG/5ML PO SOLN
5.0000 mg | Freq: Four times a day (QID) | ORAL | Status: DC | PRN
  Administered 2012-03-13 – 2012-03-14 (×2): 5 mg via ORAL
  Filled 2012-03-13 (×3): qty 5

## 2012-03-13 MED ORDER — FUROSEMIDE 40 MG PO TABS
60.0000 mg | ORAL_TABLET | Freq: Every day | ORAL | Status: DC
  Administered 2012-03-13 – 2012-03-19 (×7): 60 mg via ORAL
  Filled 2012-03-13 (×7): qty 1

## 2012-03-13 MED ORDER — DILTIAZEM HCL ER COATED BEADS 240 MG PO CP24
240.0000 mg | ORAL_CAPSULE | Freq: Every day | ORAL | Status: DC
  Administered 2012-03-13 – 2012-03-16 (×4): 240 mg via ORAL
  Filled 2012-03-13 (×5): qty 1

## 2012-03-13 MED ORDER — BIOTENE DRY MOUTH MT LIQD
15.0000 mL | Freq: Two times a day (BID) | OROMUCOSAL | Status: DC
  Administered 2012-03-13 – 2012-03-19 (×9): 15 mL via OROMUCOSAL

## 2012-03-13 MED ORDER — METHYLPREDNISOLONE SODIUM SUCC 40 MG IJ SOLR
40.0000 mg | Freq: Two times a day (BID) | INTRAMUSCULAR | Status: DC
  Administered 2012-03-13 – 2012-03-15 (×4): 40 mg via INTRAVENOUS
  Filled 2012-03-13 (×6): qty 1

## 2012-03-13 NOTE — Progress Notes (Signed)
  ANTICOAGULATION CONSULT NOTE - Follow Up Consult  Pharmacy Consult for Coumadin Indication: atrial fibrillation  No Known Allergies  Patient Measurements: Height: 5\' 9"  (175.3 cm) Weight: 159 lb 9.6 oz (72.394 kg) IBW/kg (Calculated) : 70.7  Heparin Dosing Weight:   Vital Signs: Temp: 97.4 F (36.3 C) (06/28 0745) Temp src: Oral (06/28 0745) BP: 126/76 mmHg (06/28 0745) Pulse Rate: 85  (06/28 0745)  Labs:  Basename 03/13/12 0535 03/12/12 0458 03/11/12 0600 03/11/12 0001 03/10/12 1801 03/10/12 1142  HGB 15.1 -- 16.3 -- -- --  HCT 44.7 -- 49.9 -- -- 49.0  PLT 184 -- 206 -- -- 184  APTT -- -- -- -- -- --  LABPROT 40.1* 30.4* 25.3* -- -- --  INR 4.07* 2.85* 2.26* -- -- --  HEPARINUNFRC -- -- -- -- -- --  CREATININE 0.90 -- 1.04 -- 0.81 --  CKTOTAL -- -- 39 39 31 --  CKMB -- -- 3.8 4.1* 3.6 --  TROPONINI -- -- <0.30 <0.30 <0.30 --    Estimated Creatinine Clearance: 64.4 ml/min (by C-G formula based on Cr of 0.9).  Assessment: JH is an 78 yo male from nursing home who came in to ER 6/25 with chest pain and increased dyspnea.  PTA on coumadin long term for AFib (5mg  TuWThSaSu, 2.5mg  MF).  INR is supra therapeutic today (4.07). No bleeding noted, CBC stable. INR jump may be due to interaction with Levaquin.   Goal of Therapy:  INR 2-3 Monitor platelets by anticoagulation protocol: Yes   Plan:  1) Hold coumadin today 2) Recheck INR in am  Lucia Gaskins 03/13/2012,9:01 AM

## 2012-03-13 NOTE — Progress Notes (Signed)
[place]  [facility]  [facility] has an affiliation with this company; however, you are under no obligation to use this agency. [phone] [address] [place], [place]  [facility] [phone] Fax [phone] [address] [place], [place]  [address]  [facility] [phone] Fax [phone] [address] [place], [place]  [address]  [facility] [place] [phone] Fax [phone] [address] [place], [place]  [address]  [facility] [phone] Fax [phone] [address] [place], [place]  [address]  [facility] [phone] [address] [place], [place]  [address]  [facility] [phone] Fax [phone] [address] [place], [place] [address]   [place]  [facility]  [phone] Fax [phone] [address] [place], [place]  [address]  [facility] [phone] Fax [phone] [address] [place], [place]  [address]  [facility] [place] [phone] Fax [phone] [address] [place], [place]  [address]  [facility] [phone] Fax [phone] [address] [place], [place]  [address]  [facility] [phone] Fax [phone] [address] [place], [place] [address]   [place]  [facility] [phone] Fax [phone] [address] [place], [place]  [address]  [facility] [phone] Fax [phone] [address] [place], [place]  [address]   [place]  [facility]  [phone] Fax [phone] [address] [place], [place]  [address]  [facility] [phone] Fax [phone] [address] [place], [place]  [address]  [facility] [place] [facility] [phone] Fax [phone] [address] [place], [place]  [address]  [facility] [phone] Fax [phone] [address] [place], [place]  [address]    [place]   [facility]  [phone] Fax [phone] [address] [place], [place]  [address]   [place]  [facility] [phone] Fax [phone] [address] [place], [place]  [address]  [facility] [phone] Fax [phone] [address] [place], [place]  [address]  [facility] [phone] Fax [phone] [address] [place], [place] [address]   [place]  [facility] [phone] Fax [phone] [address] [place], [place]  [address]  [facility] [phone] Fax [phone] [address] [place], [place] [address]   [place]  [facility] [phone] Fax [phone] [address] [place], [place]  [address]  [facility]   [phone] [address] [place], [place]  [address]  [facility] [place] [phone] Fax [phone] [address] [place], [place]  [address]  [facility] [phone] Fax [phone] [address] [place], [place]  [address]  [facility] [phone] Fax [phone] [address] [place], [place] [address]     [place]  [facility]  [phone] Fax [phone] [address] , [place]  [address]  [facility] [address] [place], [place]  [address] [address] [place], [place]  [address]  [facility] [phone] Fax [phone] [address] [place], [place]  [address]  [facility] [phone] [address] [place] [address]  [facility] [phone] Fax [phone] [address] [place], [place]   [address]   [place]  [facility] [phone] Fax [phone] [address] [place], [place] [address]     [place]  [facility] [address] [place], [place]  [address] [address] [place], [place]  [address]  [facility] [phone] Fax [phone] [address] [place], [place]  [address]  [facility] [phone] Fax [phone] [address] [place], [place] [address]   [place]  [facility] [phone] Fax [phone] [address] [place], [place]  [address]    

## 2012-03-13 NOTE — Progress Notes (Signed)
Inpatient Diabetes Program Recommendations  AACE/ADA: New Consensus Statement on Inpatient Glycemic Control (2009)  Target Ranges:  Prepandial:   less than 140 mg/dL      Peak postprandial:   less than 180 mg/dL (1-2 hours)      Critically ill patients:  140 - 180 mg/dL   Reason for Visit: CBG 0n 11/14/11  210 mg/dl  Inpatient Diabetes Program Recommendations Correction (SSI): Start Novolog SENSITIVE correction scale AC & HS if CBGs continue greater than 180 mg/dl and while on steroids.  Note: Check CBGs AC & HS if starting on insulin correction scale.

## 2012-03-13 NOTE — Progress Notes (Signed)
CARDIAC REHAB PHASE I   PRE:  Rate/Rhythm: 90 Afib   BP:  Supine: 150/65  Sitting:   Standing:    SaO2: 96 21/2 L  MODE:  Ambulation: 20 ft   POST:  Rate/Rhythem: 108 Afib  BP:  Supine:   Sitting: 162/53  Standing:    SaO2: 90 3L 1040-1140  On arrival pt in bed dyspneic with talking. States that he feels that he got behind last night with his SOB because he did not get any breathing treatments.Pt anxious and very dyspneic with any movement.Assisted X 1 used O2 3L and walker to ambulate. Only able to get pt to walk from be to recliner 20 feet. He states that he is not able to go any further because of SOB. Pt ask for rescue inhaler. O2 sat after walk 90% on 3L. Pt in recliner with call light in reach and report given to RN. Pt states that he is able to walk in his apartment and to the elevator prior to admission and that he is not as SOB as he is with this attempt to walk.   Beatrix Fetters

## 2012-03-13 NOTE — Progress Notes (Signed)
Palliative Medicine Team consult received, spoke with Dr Vassie Loll and confirmed request to set up hospice at [facility] are set. Junius Creamer CMRN notified to offer hospice choice.  Valente David, RN 03/13/2012, 11:33 AM Palliative Medicine Team RN Liaison [phone]

## 2012-03-13 NOTE — Progress Notes (Signed)
Patient ID: [name], male   DOB: 1928-05-25, 78 y.o.   MRN: [id number] Subjective:  Sob is improved. No chest pain.  Objective:  Vital Signs in the last 24 hours: Temp:  [97.2 F (36.2 C)-98.3 F (36.8 C)] 97.2 F (36.2 C) (06/28 0358) Pulse Rate:  [77-96] 77  (06/28 0400) Resp:  [18-24] 20  (06/28 0400) BP: (116-148)/(60-79) 116/60 mmHg (06/28 0400) SpO2:  [95 %-98 %] 98 % (06/28 0400) Weight:  [159 lb 9.6 oz (72.394 kg)] 159 lb 9.6 oz (72.394 kg) (06/28 0421)  Intake/Output from previous day: 06/27 0701 - 06/28 0700 In: 1042 [P.O.:557; I.V.:335; IV Piggyback:150] Out: 1675 [Urine:1675] Intake/Output from this shift:    Physical Exam: Well appearing NAD HEENT: Unremarkable Neck:  No JVD, no thyromegally Lungs:  Clear with no wheezes HEART:  IRegular rate rhythm, no murmurs, no rubs, no clicks Abd:  Flat, positive bowel sounds, no organomegally, no rebound, no guarding Ext:  2 plus pulses, no edema, no cyanosis, no clubbing Skin:  No rashes no nodules Neuro:  CN II through XII intact, motor grossly intact  Lab Results:  Basename 03/13/12 0535 03/11/12 0600  WBC 15.0* 10.4  HGB 15.1 16.3  PLT 184 206    Basename 03/13/12 0535 03/11/12 0600  NA 133* 139  K 3.9 4.3  CL 92* 96  CO2 30 29  GLUCOSE 210* 131*  BUN 32* 24*  CREATININE 0.90 1.04    Basename 03/11/12 0600 03/11/12 0001  TROPONINI <0.30 <0.30   Hepatic Function Panel  Basename 03/10/12 1801  PROT 6.6  ALBUMIN 3.8  AST 18  ALT 20  ALKPHOS 62  BILITOT 0.4  BILIDIR --  IBILI --    Basename 03/11/12 0600  CHOL 135   No results found for this basename: PROTIME in the last 72 hours  Imaging: No results found.  Cardiac Studies: Tele - atrial fib with a controlled VR Assessment/Plan:  1. Dyspnea 2. COPD exacerbation 3. Acute diastolic CHF 4. Atrial fib with a RVR - will switch to po. Rec: Dyspnea improved. Will continue current meds and advance activity. Look for discharge Sat. Or  Sunday depending on how he does with ambulation.  Lewayne Bunting, M.D.  LOS: 3 days  03/13/2012, 7:40 AM

## 2012-03-13 NOTE — Progress Notes (Signed)
Notified by [name] [facility]) requested by family for services after discharge. Spoke with patient and daughter [name] at bedside- at this time both patient and daughter, while agreeable to involving hospice in [facility] care, are requesting that [name] be considered for short term skilled rehab -he has done this on a prior admission (going to [facility] for rehab) - they understand that hospice would not be involved during the rehab period -however could request Palliative Care Service follow during this period- daughter aware she can call [facility] referral center at any point to request assessment after rehab stay.   Daughter and patient also voiced concern that at this point patient may not be able to go back to independent living situation without some strengthening/conditioning and she would like to look into a motorized wheelchair if possible to help him maintain independence - they want to consider all options, depending on how patient does with rehab and plan on looking into possibility of ALF if necessary. At this time, patient and daughter request, if possible, for hospice be on hold until patient transitions from a rehab setting-   Alerted [name] CMRN who will also alert CSW to patient and daughters request/questions re: rehab- Dr Vassie Loll paged to inform of above and await a call back.  Darin David, RN 03/13/2012, 3:08 PM [facility] Palliative Medicine Team RN Liaison [phone]

## 2012-03-13 NOTE — Progress Notes (Signed)
Name: [name] MRN: [id number] DOB: 05-18-1928    LOS: 3  Referring Provider:  Dr. Patty Sermons Reason for Referral:  COPD  PULMONARY / CRITICAL CARE MEDICINE  HPI:  77 year old male that is well known to our practice for Gold stage 3-4 COPD who was suffering from a recent exacerbation.  Was seen in office on 6/20 and given a prednisone taper then followed up on 6/24 where he was noted to have purulent sputum.  Levaquin was called for the patient.  The patient reports to the ED on 6/25 with chest pain and dyspnea.  In the ED the patient was noted new onset a-fib.  PCCM was consulted for COPD exacerbation management.  Past Medical History  Diagnosis Date  . Pneumonia 12/06/10    healthcare-associated/ left lower lobe  . COPD (chronic obstructive pulmonary disease)     severe stage IV  . Atrial fibrillation     on Coumadin with St. Jude single-chamber pacemaker  . Diabetes mellitus     type 2; s/p Prednisone therapy for pneumonia 12/2010  . Coronary artery disease     s/p anterior STEMI 09/2009; cath. revealed mid LAD 40%, distal LAD 100%- PTCA, prox. RCA 40%, mid. RCA 100% with good collateral filling of PDA; EF 25%; NSTEMI 07/2011 - PCI/DES 100% LCX  . ST elevation (STEMI) myocardial infarction 01//11/12    anterior  . CHF (congestive heart failure)     EF 25% on 09/26/10; EF 50-55% and grade 1 diastolic dysfunction on ECHO 08/2010   . Hypertension   . Hyperlipidemia   . AAA (abdominal aortic aneurysm)     3 cm infrarenal abdominal aortic aneurysm per aorta ultrasound 03/2010  . Osteoarthritis     s/p bilat hip arthroplasty  . Angina   . Ischemic cardiomyopathy     EF 35% LHC 11/12  . GERD (gastroesophageal reflux disease)   . Hypercholesteremia   . PVD (peripheral vascular disease)   . Anxiety     Current Status: better with morphine -used 3-4 doses, needing nebs q 4-6h & rescue inhaler  Vital Signs: Temp:  [97.2 F (36.2 C)-97.9 F (36.6 C)] 97.5 F (36.4 C)  (06/28 1615) Pulse Rate:  [77-100] 100  (06/28 1615) Resp:  [17-24] 17  (06/28 1615) BP: (116-162)/(53-79) 143/73 mmHg (06/28 1615) SpO2:  [95 %-100 %] 98 % (06/28 1615) Weight:  [159 lb 9.6 oz (72.394 kg)] 159 lb 9.6 oz (72.394 kg) (06/28 0421)  Physical Examination: General:  Chronically ill appearing elderly male. Neuro:  Alert and oriented, moving all ext to command. HEENT:  [place]/AT, PERRL, EOM-I and DMM. Neck:  Supple, -LAN and -thyromegally. Cardiovascular:  IRIR, Nl S1/S2, -M/R/G. Lungs:  Distant BS, diffuse end exp wheezesdecreased Abdomen:  Soft, NT, ND and +BS. Musculoskeletal:  -edema and -tenderness, - clubbing. Skin:  Thing but intact.  Principal Problem:  *Chest pain Active Problems:  CAD (coronary artery disease)  HLD (hyperlipidemia)  COPD exacerbation  Acute on chronic combined systolic and diastolic heart failure  Hypoxemia  Pulmonary edema  Type 2 diabetes mellitus  Permanent atrial fibrillation   ASSESSMENT AND PLAN  PULMONARY   CXR:  Cardiomegally and COPD, mild pulmonary edema. ETT:  None  A:  78 year old male with severe COPD presenting with COPD exacerbation and a-fib with RVR. P:   Drop Solumedrol.40 q 12h Levaquin (patient has not been exposed to health care settings since February and levaquin was just started on 6/24 so will not treat  as resistance as an issue). PRN xopenex. Nebulized budesonide. - Change to 7.5 mg morphine IR bid  for dyspnea & titrate to effect DNR noted Rescue xopenex MDI at bedside  CARDIOVASCULAR  Lab 03/11/12 0600 03/11/12 0001 03/10/12 1801 03/10/12 1207  TROPONINI <0.30 <0.30 <0.30 <0.30  LATICACIDVEN -- -- -- --  PROBNP -- -- 2962.0* --   ECG:  A-fib. Lines: PIV.  A: CHF and a-fib history. P:  Cardizem drip - change to PO . Would only use zebeta for beta blockers.  RENAL  Lab 03/13/12 0535 03/11/12 0600 03/10/12 1801 03/10/12 1142  NA 133* 139 139 137  K 3.9 4.3 -- --  CL 92* 96 101 99  CO2 30 29  27 27   BUN 32* 24* 21 22  CREATININE 0.90 1.04 0.81 0.80  CALCIUM 8.8 9.9 9.8 9.9  MG -- -- 2.3 --  PHOS -- -- -- --   Intake/Output      06/27 0701 - 06/28 0700 06/28 0701 - 06/29 0700   P.O. 557 824   I.V. (mL/kg) 350 (4.8) 30 (0.4)   IV Piggyback 150    Total Intake(mL/kg) 1057 (14.6) 854 (11.8)   Urine (mL/kg/hr) 1675 (1) 800 (0.9)   Total Output 1675 800   Net -618 +54         Foley:  None  A:  No active issues. P:   F/U BMET as patient is being diuresed.  HEMATOLOGIC  Lab 03/13/12 0535 03/12/12 0458 03/11/12 0600 03/10/12 1215 03/10/12 1142  HGB 15.1 -- 16.3 -- 16.7  HCT 44.7 -- 49.9 -- 49.0  PLT 184 -- 206 -- 184  INR 4.07* 2.85* 2.26* 2.01* --  APTT -- -- -- -- --   A:  INR 2 due to coumadin. P:  Coumadin per pharmacy.  INFECTIOUS  Lab 03/13/12 0535 03/11/12 0600 03/10/12 1142  WBC 15.0* 10.4 10.0  PROCALCITON -- -- --   Cultures: Blood 6/25>>>ng Urine 6/25>>>ng Sputum 6/25>>> Antibiotics: Levaquin 6/24>>>  A:  Purulent bronchitis. P:   Levaquin.until 6/30   ENDOCRINE  Lab 03/11/12 0734  GLUCAP 124*   A:  No diabetes history.   P:   Follow sugars while on steroids.  OK to transfer to tele once off drip .Updated pt & daughter. They are willing to explore the home with hospice option vs SNF  Oretha Milch., M.D. Pulmonary and Critical Care Medicine [facility] Pager: [phone] 03/13/2012, 6:49 PM

## 2012-03-14 DIAGNOSIS — J40 Bronchitis, not specified as acute or chronic: Secondary | ICD-10-CM

## 2012-03-14 LAB — PROTIME-INR: INR: 4.85 — ABNORMAL HIGH (ref 0.00–1.49)

## 2012-03-14 NOTE — Progress Notes (Signed)
PT Cancellation Note  Treatment cancelled this morning due to medical issues with patient which prohibited therapy: patient's INR 4.85. Will attempt to see later this afternoon, as schedule allows.   Shawnise Peterkin 03/14/2012, 8:29 AM Pager [phone]

## 2012-03-14 NOTE — Progress Notes (Signed)
Patient ID: [name], male   DOB: [birth year], 78 y.o.   MRN: [id number] Still SOB. Not ready to go home. Family at bedside. Was at [facility].   Patient Name: [name] Date of Encounter: 03/14/2012    SUBJECTIVE  See above.  CURRENT MEDS    . antiseptic oral rinse  15 mL Mouth Rinse BID  . bisoprolol  5 mg Oral Daily  . budesonide  0.25 mg Nebulization QID  . Chlorhexidine Gluconate Cloth  6 each Topical Q0600  . cholecalciferol  2,000 Units Oral Daily  . clopidogrel  75 mg Oral Q breakfast  . digoxin  250 mcg Oral Daily  . diltiazem  240 mg Oral Daily  . feeding supplement  237 mL Oral BID BM  . furosemide  60 mg Oral Daily  . guaiFENesin  1,200 mg Oral BID  . isosorbide mononitrate  60 mg Oral Daily  . methylPREDNISolone (SOLU-MEDROL) injection  40 mg Intravenous Q12H  . morphine  7.5 mg Oral BID  . mupirocin ointment  1 application Nasal BID  . pantoprazole  40 mg Oral Q1200  . senna  2 tablet Oral BID  . sodium chloride  3 mL Intravenous Q12H  . Tamsulosin HCl  0.4 mg Oral QPC supper  . Warfarin - Pharmacist Dosing Inpatient   Does not apply q1800    OBJECTIVE  Filed Vitals:   03/13/12 2010 03/13/12 2215 03/14/12 0526 03/14/12 0843  BP:  150/71 144/72   Pulse:  93 92   Temp:  97.6 F (36.4 C) 98.4 F (36.9 C)   TempSrc:  Oral Oral   Resp:  18 18   Height:  5\' 9"  (1.753 m)    Weight:  158 lb 9.6 oz (71.94 kg) 158 lb 15.2 oz (72.1 kg)   SpO2: 96% 95% 96% 97%    Intake/Output Summary (Last 24 hours) at 03/14/12 1214 Last data filed at 03/14/12 0818  Gross per 24 hour  Intake    857 ml  Output   1775 ml  Net   -918 ml   Filed Weights   03/13/12 0421 03/13/12 2215 03/14/12 0526  Weight: 159 lb 9.6 oz (72.394 kg) 158 lb 9.6 oz (71.94 kg) 158 lb 15.2 oz (72.1 kg)    PHYSICAL EXAM  General: Pleasant, NAD. Dyspneic Neuro: Alert and oriented X 3. Moves all extremities spontaneously. Psych: Normal affect. HEENT:   Normal  Neck: Supple without bruits or JVD. Lungs:  Resp regular and unlabored, CTA. Heart: IRR no s3, s4, or murmurs. Abdomen: Soft, non-tender, non-distended, BS + x 4.  Extremities: No clubbing, cyanosis or edema. DP/PT/Radials 2+ and equal bilaterally.  Accessory Clinical Findings  CBC  Basename 03/13/12 0535  WBC 15.0*  NEUTROABS --  HGB 15.1  HCT 44.7  MCV 91.2  PLT 184   Basic Metabolic Panel  Basename 03/13/12 0535  NA 133*  K 3.9  CL 92*  CO2 30  GLUCOSE 210*  BUN 32*  CREATININE 0.90  CALCIUM 8.8  MG --  PHOS --   Liver Function Tests No results found for this basename: AST:2,ALT:2,ALKPHOS:2,BILITOT:2,PROT:2,ALBUMIN:2 in the last 72 hours No results found for this basename: LIPASE:2,AMYLASE:2 in the last 72 hours Cardiac Enzymes No results found for this basename: CKTOTAL:3,CKMB:3,CKMBINDEX:3,TROPONINI:3 in the last 72 hours BNP No components found with this basename: POCBNP:3 D-Dimer No results found for this basename: DDIMER:2 in the last 72 hours Hemoglobin A1C No results found for this basename:  HGBA1C in the last 72 hours Fasting Lipid Panel No results found for this basename: CHOL,HDL,LDLCALC,TRIG,CHOLHDL,LDLDIRECT in the last 72 hours Thyroid Function Tests No results found for this basename: TSH,T4TOTAL,FREET3,T3FREE,THYROIDAB in the last 72 hours  TELE  AFIB  ECG    Radiology/Studies  Dg Chest 2 View  03/10/2012  *RADIOLOGY REPORT*  Clinical Data: Cough and chest pain  CHEST - 2 VIEW  Comparison: 11/07/2011  Findings: Left subclavian single lead pacemaker device and leads are stable.  Lungs are hyperaerated without pneumothorax.  Mild cardiomegaly.  Normal vascularity.  No pleural effusion.  Chronic changes at the right lung base.  IMPRESSION: Cardiomegaly.  Changes related to COPD.  Original Report Authenticated By: Donavan Burnet, M.D.    ASSESSMENT AND PLAN  Principal Problem:  *Chest pain Active Problems:  CAD (coronary artery  disease)  HLD (hyperlipidemia)  COPD exacerbation  Acute on chronic combined systolic and diastolic heart failure  Hypoxemia  Pulmonary edema  Type 2 diabetes mellitus  Permanent atrial fibrillation   Continues to diurese. Rate control ok. Will continue current treatment with discharge target to [facility] on Monday. Care Management involved.  Signed, Valera Castle MD

## 2012-03-14 NOTE — Progress Notes (Signed)
Name: [name] MRN: [id number] DOB: 10-22-1927    LOS: 4  Referring Provider:  Dr. 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  Was seen in office on 6/20 and given a prednisone taper then followed up on 6/24 where he was noted to have purulent sputum.  Levaquin was called for the patient.  The patient reports to the ED on 6/25 with chest pain and dyspnea.  In the ED the patient was noted new onset a-fib.  PCCM was consulted for COPD exacerbation management.  Past Medical History  Diagnosis Date  . Pneumonia 12/06/10    healthcare-associated/ left lower lobe  . COPD (chronic obstructive pulmonary disease)     severe stage IV  . Atrial fibrillation     on Coumadin with [employer] single-chamber pacemaker  . Diabetes mellitus     type 2; s/p Prednisone therapy for pneumonia 12/2010  . Coronary artery disease     s/p anterior STEMI 09/2009; cath. revealed mid LAD 40%, distal LAD 100%- PTCA, prox. RCA 40%, mid. RCA 100% with good collateral filling of PDA; EF 25%; NSTEMI 07/2011 - PCI/DES 100% LCX  . ST elevation (STEMI) myocardial infarction 01//11/12    anterior  . CHF (congestive heart failure)     EF 25% on 09/26/10; EF 50-55% and grade 1 diastolic dysfunction on ECHO 08/2010   . Hypertension   . Hyperlipidemia   . AAA (abdominal aortic aneurysm)     3 cm infrarenal abdominal aortic aneurysm per aorta ultrasound 03/2010  . Osteoarthritis     s/p bilat hip arthroplasty  . Angina   . Ischemic cardiomyopathy     EF 35% LHC 11/12  . GERD (gastroesophageal reflux disease)   . Hypercholesteremia   . PVD (peripheral vascular disease)   . Anxiety     Current Status:  -better with morphine prn, needing nebs q 4-6h & rescue inhaler   Vital Signs: Temp:  [97.3 F (36.3 C)-98.4 F (36.9 C)] 98.4 F (36.9 C) (06/29  0526) Pulse Rate:  [92-100] 92  (06/29 0526) Resp:  [17-20] 18  (06/29 0526) BP: (131-162)/(53-73) 144/72 mmHg (06/29 0526) SpO2:  [95 %-100 %] 97 % (06/29 0843) Weight:  [71.94 kg (158 lb 9.6 oz)-72.1 kg (158 lb 15.2 oz)] 72.1 kg (158 lb 15.2 oz) (06/29 0526)  Physical Examination: General:  Chronically ill appearing elderly male. Neuro:  Alert and oriented, moving all ext to command. HEENT:  [place]/AT, PERRL, EOM-I and DMM. Neck:  Supple, -LAN and -thyromegally. Cardiovascular:  irreg, Nl S1/S2, -M/R/G. Lungs:  Distant BS, diffuse end exp wheezes decreased Abdomen:  Soft, NT, ND and +BS. Musculoskeletal:  -edema and -tenderness, - clubbing. Skin:  Thin but intact.   Principal Problem:  *Chest pain Active Problems:  CAD (coronary artery disease)  HLD (hyperlipidemia)  COPD exacerbation  Acute on chronic combined systolic and diastolic heart failure  Hypoxemia  Pulmonary edema  Type 2 diabetes mellitus  Permanent atrial fibrillation   ASSESSMENT AND PLAN  PULMONARY   CXR:  Cardiomegally and COPD, mild pulmonary edema.  A:  78 year old male with severe COPD presenting with COPD exacerbation and a-fib with RVR. P:   Solumedrol.40 q 12h Levaquin (patient has not been exposed to health care settings since February and levaquin was just started on 6/24 so  will not treat as resistance as an issue)==> 6/29 I reviewed med list- not on Levaquin- it was discontinued yest by JY... PRN xopenex. Nebulized budesonide. - Change to 7.5 mg morphine IR bid  for dyspnea & titrate to effect DNR noted Rescue xopenex MDI at bedside  CARDIOVASCULAR  Lab 03/11/12 0600 03/11/12 0001 03/10/12 1801 03/10/12 1207  TROPONINI <0.30 <0.30 <0.30 <0.30  LATICACIDVEN -- -- -- --  PROBNP -- -- 2962.0* --   ECG:  A-fib. Lines: PIV.  A: CHF and a-fib history. P:  Cardizem drip - change to PO . Would only use zebeta for beta blockers.  RENAL  Lab 03/13/12 0535 03/11/12 0600 03/10/12 1801  03/10/12 1142  NA 133* 139 139 137  K 3.9 4.3 -- --  CL 92* 96 101 99  CO2 30 29 27 27   BUN 32* 24* 21 22  CREATININE 0.90 1.04 0.81 0.80  CALCIUM 8.8 9.9 9.8 9.9  MG -- -- 2.3 --  PHOS -- -- -- --   Intake/Output      06/28 0701 - 06/29 0700 06/29 0701 - 06/30 0700   P.O. 1004 240   I.V. (mL/kg) 30 (0.4)    IV Piggyback     Total Intake(mL/kg) 1034 (14.3) 240 (3.3)   Urine (mL/kg/hr) 1775 (1)    Total Output 1775    Net -741 +240         Foley:  None  A:  No active issues. P:   F/U BMET as patient is being diuresed.  HEMATOLOGIC  Lab 03/14/12 0539 03/13/12 0535 03/12/12 0458 03/11/12 0600 03/10/12 1215 03/10/12 1142  HGB -- 15.1 -- 16.3 -- 16.7  HCT -- 44.7 -- 49.9 -- 49.0  PLT -- 184 -- 206 -- 184  INR 4.85* 4.07* 2.85* 2.26* 2.01* --  APTT -- -- -- -- -- --   A:  INR / dosing per PHARM P:  Coumadin per pharmacy.  INFECTIOUS  Lab 03/13/12 0535 03/11/12 0600 03/10/12 1142  WBC 15.0* 10.4 10.0  PROCALCITON -- -- --   Cultures: Blood 6/25>>>ng Urine 6/25>>>ng Sputum 6/25>>> Antibiotics: Levaquin 6/24>>>  A:  Purulent bronchitis. P:   Levaquin dc'd 6/28 by JYacoub  ENDOCRINE  Lab 03/11/12 0734  GLUCAP 124*   A:  No diabetes history.   P:   Follow sugars while on steroids.   Michele Mcalpine, M.D. Pulmonary and Critical Care Medicine [facility] 03/14/2012, 9:56 AM

## 2012-03-14 NOTE — Progress Notes (Addendum)
CARDIAC REHAB PHASE I   PRE:  Rate/Rhythm: 91 afib with PVC    BP: sitting 136/52    SaO2: 100 3L  MODE:  Ambulation: 120 ft   POST:  Rate/Rhythm: 109    BP: sitting 190/72     SaO2: 97 4L  Pt sts less SOB today. SaO2 better in general. Pt pushed himself to walk good distance with rollator, 4L, supervision x1. Some SOB. Very exerted after walk once sitting. Took 4-5 min to recover. BP 190/72. Left in recliner on 3L O2. Pt happy to walk today. [phone]  [name] CES, ACSM

## 2012-03-14 NOTE — Progress Notes (Signed)
ANTICOAGULATION CONSULT NOTE - Follow Up Consult  Pharmacy Consult for Coumadin Indication: atrial fibrillation  No Known Allergies  Patient Measurements: Height: 5\' 9"  (175.3 cm) Weight: 158 lb 15.2 oz (72.1 kg) IBW/kg (Calculated) : 70.7   Vital Signs: Temp: 98.4 F (36.9 C) (06/29 0526) Temp src: Oral (06/29 0526) BP: 144/72 mmHg (06/29 0526) Pulse Rate: 92  (06/29 0526)  Labs:  Basename 03/14/12 0539 03/13/12 0535 03/12/12 0458  HGB -- 15.1 --  HCT -- 44.7 --  PLT -- 184 --  APTT -- -- --  LABPROT 46.0* 40.1* 30.4*  INR 4.85* 4.07* 2.85*  HEPARINUNFRC -- -- --  CREATININE -- 0.90 --  CKTOTAL -- -- --  CKMB -- -- --  TROPONINI -- -- --    Estimated Creatinine Clearance: 64.4 ml/min (by C-G formula based on Cr of 0.9).  Assessment: 78 y.o. M resumed on warfarin from PTA for hx Afib with a SUPRAtherapeutic INR this a.m despite holding last night's dose (INR 4.85 << 4.07, goal of 2-3). PTA dose was noted to be 5 mg daily EXCEPT for 2.5 mg on Mon/Fri. Elevations in INR are likely related to concurrent IV steroid taper and interaction with Levaquin (now d/ced) as both of these are known to increase warfarin sensitivity. No CBC today, appears stable from labs drawn on 6/28. No s/sx of bleeding noted at this time.   Goal of Therapy:  INR 2-3   Plan:  1. Hold warfarin dose today 2. Will continue to monitor for any signs/symptoms of bleeding and will follow up with PT/INR in the a.m.   Georgina Pillion, PharmD, BCPS Clinical Pharmacist Pager: [phone] 03/14/2012 8:02 AM

## 2012-03-15 LAB — PROTIME-INR
INR: 3.31 — ABNORMAL HIGH (ref 0.00–1.49)
Prothrombin Time: 34.1 seconds — ABNORMAL HIGH (ref 11.6–15.2)

## 2012-03-15 MED ORDER — BISOPROLOL FUMARATE 10 MG PO TABS
10.0000 mg | ORAL_TABLET | Freq: Every day | ORAL | Status: DC
  Administered 2012-03-15 – 2012-03-19 (×5): 10 mg via ORAL
  Filled 2012-03-15 (×5): qty 1

## 2012-03-15 MED ORDER — WARFARIN SODIUM 2.5 MG PO TABS
2.5000 mg | ORAL_TABLET | Freq: Once | ORAL | Status: AC
  Administered 2012-03-15: 2.5 mg via ORAL
  Filled 2012-03-15: qty 1

## 2012-03-15 MED ORDER — PREDNISONE 20 MG PO TABS
20.0000 mg | ORAL_TABLET | Freq: Two times a day (BID) | ORAL | Status: DC
  Administered 2012-03-15 – 2012-03-17 (×4): 20 mg via ORAL
  Filled 2012-03-15 (×6): qty 1

## 2012-03-15 NOTE — Progress Notes (Addendum)
SUBJECTIVE: Breathing better, but still requires some breathing treatments.No chest pain.  Principal Problem:  *Chest pain Active Problems:  CAD (coronary artery disease)  HLD (hyperlipidemia)  COPD exacerbation  Acute on chronic combined systolic and diastolic heart failure  Hypoxemia  Pulmonary edema  Type 2 diabetes mellitus  Permanent atrial fibrillation   LABS: Basic Metabolic Panel:  Basename 03/13/12 0535  NA 133*  K 3.9  CL 92*  CO2 30  GLUCOSE 210*  BUN 32*  CREATININE 0.90  CALCIUM 8.8  MG --  PHOS --    CBC:  Basename 03/13/12 0535  WBC 15.0*  NEUTROABS --  HGB 15.1  HCT 44.7  MCV 91.2  PLT 184   RADIOLOGY: CHEST - 2 VIEW Findings: Left subclavian single lead pacemaker device and leads are stable. Lungs are hyperaerated without pneumothorax. Mild cardiomegaly. Normal vascularity. No pleural effusion. Chronic changes at the right lung base.  IMPRESSION: Cardiomegaly. Changes related to COPD.   PHYSICAL EXAM BP 120/66  Pulse 108  Temp 97.7 F (36.5 C) (Oral)  Resp 18  Ht 5\' 9"  (1.753 m)  Wt 156 lb 8 oz (70.988 kg)  BMI 23.11 kg/m2  SpO2 96%WT Loss 3 lbs. General: Well developed, well nourished, in no acute distress Head: Eyes PERRLA, No xanthomas.   Normal cephalic and atramatic  Lungs: Inspiratory wheezes, scattered crackles. Heart: HRIR  No MRG .  Pulses are 2+ & equal.            No carotid bruit. No JVD.  No abdominal bruits. No femoral bruits. Abdomen: Bowel sounds are positive, abdomen soft and non-tender without masses or                  Hernia's noted. Msk:  Back normal, normal gait. Normal strength and tone for age. Extremities: No clubbing, cyanosis or edema.  DP +1 Neuro: Alert and oriented X 3. Psych:  Good affect, responds appropriately  TELEMETRY: Reviewed telemetry pt in Atrial fib with salvo of PVC's heart rates going up into the 140's transiently.  ASSESSMENT AND PLAN:  1. Atrial Fibrillation: Rate is not  completely controlled. Having episodes of elevated rates into the 140;s with frequent PVC's. Potassium status is stable. He is on digoxin, diltiazem 240 mg, and bisoprolol 5 mg daily. Will increase bisoprolol to 10 mg and monitor response.   2.COPD: Continues to have breathing issues at time. He states he has sudden onsets of dyspnea. This may be related to episodes of increased HR which is transient. He states breathing treatments help with these episodes. Plans for discharge on Monday back to SNF.    Bettey Mare. Lyman Bishop NP [facility] 03/15/2012, 7:53 AM  Patient examined and agree except changes made. Will check BMET in am.  Valera Castle, MD 03/15/2012 11:02 AM

## 2012-03-15 NOTE — Progress Notes (Signed)
Notified of pt HR going up to 140 for a few seconds, now back to baseline.  Pt asymptomatic.  Will continue to monitor.

## 2012-03-15 NOTE — Progress Notes (Addendum)
Name: [name] MRN: [id number] DOB: [birth year]    LOS: 5  Referring Provider:  Dr. 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  Was seen in office on 6/20 and given a prednisone taper then followed up on 6/24 where he was noted to have purulent sputum.  Levaquin was called for the patient.  The patient reports to the ED on 6/25 with chest pain and dyspnea.  In the ED the patient was noted new onset a-fib.  PCCM was consulted for COPD exacerbation management.   Current Status:  -better with morphine prn, needing nebs q 4-6h & rescue inhaler  - but looks deconditioned and depressed; unwilling to participate in rehab  Vital Signs: Temp:  [97.7 F (36.5 C)] 97.7 F (36.5 C) (06/30 0529) Pulse Rate:  [98-108] 108  (06/30 0529) Resp:  [18] 18  (06/30 0529) BP: (120-150)/(66-96) 120/66 mmHg (06/30 0529) SpO2:  [96 %-100 %] 96 % (06/30 0745) Weight:  [70.988 kg (156 lb 8 oz)] 70.988 kg (156 lb 8 oz) (06/30 0529)  Physical Examination: General:  Chronically ill appearing elderly male. Psych: flat affect Neuro:  Alert and oriented, moving all ext to command. HEENT:  [place]/AT, PERRL, EOM-I and DMM. Neck:  Supple, -LAN and -thyromegally. Cardiovascular:  irreg, Nl S1/S2, -M/R/G. Lungs:  Distant BS, diffuse end exp wheezes decreased Abdomen:  Soft, NT, ND and +BS. Musculoskeletal:  -edema and -tenderness, - clubbing. Skin:  Thin but intact.   Principal Problem:  *Chest pain Active Problems:  COPD exacerbation  CAD (coronary artery disease)  Depression  HLD (hyperlipidemia)  Acute on chronic combined systolic and diastolic heart failure  Hypoxemia  Pulmonary edema  Type 2 diabetes mellitus  Permanent atrial fibrillation   ASSESSMENT AND PLAN  PULMONARY   CXR:  Cardiomegally and COPD, mild pulmonary edema.  A:   78 year old male with severe COPD presenting with COPD exacerbation and a-fib with RVR. S/p levaquin RX P:   -Pred 20mg  bid wit slow taper over 2-3 weeks On Solumedrol 40 q12h, they are hoping for disch soon therefore will change to po Pred 20Bid... --PRN xopenex. -Nebulized budesonide. - Change to 7.5 mg morphine IR bid  for dyspnea & titrate to effect -DNR noted - will do roflumilas as opd  CARDIOVASCULAR  Lab 03/11/12 0600 03/11/12 0001 03/10/12 1801 03/10/12 1207  TROPONINI <0.30 <0.30 <0.30 <0.30  LATICACIDVEN -- -- -- --  PROBNP -- -- 2962.0* --   ECG:  A-fib. Lines: PIV.  A: CHF and a-fib history. P:  Per cards  RENAL  Lab 03/13/12 0535 03/11/12 0600 03/10/12 1801 03/10/12 1142  NA 133* 139 139 137  K 3.9 4.3 -- --  CL 92* 96 101 99  CO2 30 29 27 27   BUN 32* 24* 21 22  CREATININE 0.90 1.04 0.81 0.80  CALCIUM 8.8 9.9 9.8 9.9  MG -- -- 2.3 --  PHOS -- -- -- --   Intake/Output      06/29 0701 - 06/30 0700 06/30 0701 - 07/01 0700   P.O. 820 240   I.V. (mL/kg)     Total Intake(mL/kg) 820 (11.6) 240 (3.4)   Urine (mL/kg/hr) 2375 (1.4)    Total Output 2375    Net -1555 +240         Foley:  None  A:  No active issues. P:   F/U BMET as patient is being diuresed.  HEMATOLOGIC  Lab 03/15/12 0535 03/14/12 0539 03/13/12 0535 03/12/12 0458 03/11/12 0600 03/10/12 1142  HGB -- -- 15.1 -- 16.3 16.7  HCT -- -- 44.7 -- 49.9 49.0  PLT -- -- 184 -- 206 184  INR 3.31* 4.85* 4.07* 2.85* 2.26* --  APTT -- -- -- -- -- --   A:  INR / dosing per PHARM P:  Coumadin per pharmacy.  INFECTIOUS  Lab 03/13/12 0535 03/11/12 0600 03/10/12 1142  WBC 15.0* 10.4 10.0  PROCALCITON -- -- --   Cultures: Blood 6/25>>>ng Urine 6/25>>>ng Sputum 6/25>>> Antibiotics: Levaquin 6/24>>>  A:  Purulent bronchitis.s/p levaquin P:  Monitor   ENDOCRINE  Lab 03/11/12 0734  GLUCAP 124*   A:  No diabetes history.   P:   Follow sugars while on steroids.   NEURO A: He  appears depressed as he did in 03/02/2013when he yearned for his dead wife but refused pscyh  Consult and medication P: plan to screen again for depression    Dr. Kalman Shan, M.D., [facility].C.P Pulmonary and Critical Care Medicine Staff Physician [facility]  Pulmonary and Critical Care Pager: [phone], If no answer or between  15:00h - 7:00h: call [phone]  03/16/2012 6:18 PM

## 2012-03-15 NOTE — Progress Notes (Signed)
Physical Therapy Evaluation Patient Details Name: [name] MRN: [id number] DOB: 1928/04/13 Today's Date: 03/15/2012 Time: 1200-1220 PT Time Calculation (min): 20 min  PT Assessment / Plan / Recommendation Clinical Impression  78 yo male admitted with chest pain/COPD exacerbation presents with decr mobility and decr functional capacity; Will benefit from PT to maximize independence and safety with mobility, transfers, amb and to facilitate dc planning    PT Assessment  Patient needs continued PT services    Follow Up Recommendations  Skilled nursing facility    Barriers to Discharge Decreased caregiver support      Equipment Recommendations  Defer to next venue    Recommendations for Other Services OT consult (for ADLs)   Frequency Min 3X/week    Precautions / Restrictions Precautions Precautions: Fall Precaution Comments: Decr functional capacity Restrictions Other Position/Activity Restrictions: 3 L supplemental O2   Pertinent Vitals/Pain DOE 3/4; Session conducted on 3 L O2, O2 sats greater than or equal to 95% once sitting in chair      Mobility  Bed Mobility Bed Mobility: Supine to Sit;Sitting - Scoot to Edge of Bed Supine to Sit: 4: Min guard;With rails Sitting - Scoot to Edge of Bed: 4: Min guard;With rail Details for Bed Mobility Assistance: Required increased time (pt reported feeling "tired" post breathing treatment) Transfers Transfers: Sit to Stand;Stand to Sit Sit to Stand: 3: Mod assist;With upper extremity assist;From bed Stand to Sit: 4: Min assist;With upper extremity assist;To bed;To chair/3-in-1 Details for Transfer Assistance: Very weak, with need to sit down almost immediately upon standing; Heaviliy dependent on UE push, and UE support on RW; Required physical assist to steady RW; decreased control of descent with stand to sit Ambulation/Gait Ambulation/Gait Assistance: 4: Min assist Ambulation Distance (Feet): 2 Feet Assistive device:  Rolling walker Ambulation/Gait Assistance Details: Very close guard as pt reports feeling weak and sore in LEs after walk with Cardiac Rehab yesterday; Did not tolerate more than a few pivot steps to recliner    Exercises     PT Diagnosis: Difficulty walking;Generalized weakness (decr functional capacity)  PT Problem List: Decreased strength;Decreased activity tolerance;Decreased balance;Decreased mobility;Cardiopulmonary status limiting activity;Pain PT Treatment Interventions: DME instruction;Gait training;Functional mobility training;Therapeutic activities;Therapeutic exercise;Patient/family education   PT Goals Acute Rehab PT Goals PT Goal Formulation: With patient Time For Goal Achievement: 03/29/12 Potential to Achieve Goals: Fair Pt will go Supine/Side to Sit: with modified independence PT Goal: Supine/Side to Sit - Progress: Goal set today Pt will go Sit to Supine/Side: with modified independence PT Goal: Sit to Supine/Side - Progress: Goal set today Pt will go Sit to Stand: with modified independence PT Goal: Sit to Stand - Progress: Goal set today Pt will go Stand to Sit: with modified independence PT Goal: Stand to Sit - Progress: Goal set today Pt will Ambulate: >150 feet;with supervision;with rolling walker PT Goal: Ambulate - Progress: Goal set today  Visit Information  Last PT Received On: 03/15/12 Assistance Needed: +1    Subjective Data  Subjective: Reports is planning on going to SNF level of care for rehab and then to [facility] Patient Stated Goal: get better   Prior Functioning  Home Living Lives With: Alone (at ALF -- plans to dc to SNF) Available Help at Discharge: Skilled Nursing Facility Type of Home: Assisted living Home Adaptive Equipment: Walker - rolling Additional Comments: Plans to dc to SNF for rehab, then back to [facility] [employer] Prior Function Level of Independence: Needs assistance Communication Communication: Other (comment) (speaking  somewhat  limited by work of breathing)    Cognition  Overall Cognitive Status: Appears within functional limits for tasks assessed/performed Arousal/Alertness: Awake/alert Orientation Level: Appears intact for tasks assessed Behavior During Session: [facility] for tasks performed    Extremity/Trunk Assessment Right Upper Extremity Assessment RUE ROM/Strength/Tone: [facility] for tasks assessed Left Upper Extremity Assessment LUE ROM/Strength/Tone: WFL for tasks assessed Right Lower Extremity Assessment RLE ROM/Strength/Tone: Deficits RLE ROM/Strength/Tone Deficits: Grossly decr strength, with heavy dependence on UE push for successful sit to stand and decr muscle endurance, with need to sit back down rather quickly Left Lower Extremity Assessment LLE ROM/Strength/Tone: Deficits LLE ROM/Strength/Tone Deficits: Grossly decr strength, with heavy dependence on UE push for successful sit to stand and decr muscle endurance, with need to sit back down rather quickly   Balance    End of Session PT - End of Session Equipment Utilized During Treatment: Oxygen (3 L) Activity Tolerance: Patient limited by fatigue Patient left: in chair;with call bell/phone within reach;with nursing in room Nurse Communication: Mobility status  GP     Van Clines [facility] [place], [place] [phone]  03/15/2012, 1:37 PM

## 2012-03-15 NOTE — Progress Notes (Signed)
ANTICOAGULATION CONSULT NOTE - Follow Up Consult  Pharmacy Consult for Coumadin Indication: atrial fibrillation  No Known Allergies  Patient Measurements: Height: 5\' 9"  (175.3 cm) Weight: 156 lb 8 oz (70.988 kg) IBW/kg (Calculated) : 70.7   Vital Signs: Temp: 97.7 F (36.5 C) (06/30 0529) Temp src: Oral (06/30 0529) BP: 168/80 mmHg (06/30 1107) Pulse Rate: 103  (06/30 1106)  Labs:  Basename 03/15/12 0535 03/14/12 0539 03/13/12 0535  HGB -- -- 15.1  HCT -- -- 44.7  PLT -- -- 184  APTT -- -- --  LABPROT 34.1* 46.0* 40.1*  INR 3.31* 4.85* 4.07*  HEPARINUNFRC -- -- --  CREATININE -- -- 0.90  CKTOTAL -- -- --  CKMB -- -- --  TROPONINI -- -- --    Estimated Creatinine Clearance: 64.4 ml/min (by C-G formula based on Cr of 0.9).  Assessment: 78 y.o. M resumed on warfarin from PTA for hx Afib with a SUPRAtherapeutic INR this a.m though trended down significantly (INR 3.31 << 4.85, goal of 2-3). PTA dose was noted to be 5 mg daily EXCEPT for 2.5 mg on Mon/Fri. Elevations in INR are likely related to concurrent steroid taper (now changed to po) and interaction with Levaquin (now d/ced) as both of these are known to increase warfarin sensitivity. No CBC today, appears stable from labs drawn on 6/28. No s/sx of bleeding noted at this time. Will resume warfarin dose today given large drop in INR in the past 24 hours.   Goal of Therapy:  INR 2-3   Plan:  1. Warfarin 2.5 mg x 1 dose at 1800 today 2. Will continue to monitor for any signs/symptoms of bleeding and will follow up with PT/INR in the a.m.   Georgina Pillion, PharmD, BCPS Clinical Pharmacist Pager: [phone] 03/15/2012 12:38 PM

## 2012-03-16 DIAGNOSIS — F3289 Other specified depressive episodes: Secondary | ICD-10-CM

## 2012-03-16 DIAGNOSIS — F32A Depression, unspecified: Secondary | ICD-10-CM

## 2012-03-16 DIAGNOSIS — F329 Major depressive disorder, single episode, unspecified: Secondary | ICD-10-CM

## 2012-03-16 LAB — BASIC METABOLIC PANEL
BUN: 37 mg/dL — ABNORMAL HIGH (ref 6–23)
Calcium: 8.8 mg/dL (ref 8.4–10.5)
Creatinine, Ser: 0.82 mg/dL (ref 0.50–1.35)
GFR calc Af Amer: >90 mL/min (ref >90)

## 2012-03-16 LAB — CBC
MCHC: 32.5 g/dL (ref 30.0–36.0)
Platelets: 172 10*3/uL (ref 150–400)
RDW: 12.6 % (ref 11.5–15.5)
WBC: 10.6 10*3/uL — ABNORMAL HIGH (ref 4.0–10.5)

## 2012-03-16 LAB — PROTIME-INR
INR: 2.19 — ABNORMAL HIGH (ref 0.00–1.49)
Prothrombin Time: 24.7 seconds — ABNORMAL HIGH (ref 11.6–15.2)

## 2012-03-16 MED ORDER — WARFARIN SODIUM 5 MG PO TABS
5.0000 mg | ORAL_TABLET | Freq: Once | ORAL | Status: AC
  Administered 2012-03-16: 5 mg via ORAL
  Filled 2012-03-16: qty 1

## 2012-03-16 NOTE — Clinical Social Work Psychosocial (Signed)
     Clinical Social Work Department BRIEF PSYCHOSOCIAL ASSESSMENT 03/16/2012  Patient:  [name], [name]     Account Number:  [id number]     Admit date:  03/10/2012  Clinical Social Worker:  Doree Albee  Date/Time:  03/13/2012 05:00 PM  Referred by:  RN  Date Referred:  03/13/2012 Referred for  SNF Placement   Other Referral:   Interview type:  Patient Other interview type:   and patient daughter    PSYCHOSOCIAL DATA Living Status:  FAMILY Admitted from facility:   Level of care:   Primary support name:  [name] Primary support relationship to patient:  CHILD, ADULT Degree of support available:   Strong    CURRENT CONCERNS Current Concerns  Post-Acute Placement   Other Concerns:    SOCIAL WORK ASSESSMENT / PLAN CSW met with pt at bedside to discuss pt current discharge plans. pt stated that he would like to go to snf for short term rehab. Pt currently lives at home with hospice services. Pt asked CSW to speak with pt duaghter by phone.    Pt daughter agreed with pt discharge plans. Pt family would like pt to go to [place] or [facility] with Palliative services following.  CSW will iniate snf placement process.   Assessment/plan status:   Other assessment/ plan:   Information/referral to community resources:   skilled nursing facility    PATIENTS/FAMILYS RESPONSE TO PLAN OF CARE: Pt and pt daughter appreciated csw concern and support. Pt plans to go to short term rehab with palliative in hopes of returning home iwht hospice after rheab.

## 2012-03-16 NOTE — Clinical Social Work Placement (Addendum)
    Clinical Social Work Department CLINICAL SOCIAL WORK PLACEMENT NOTE 03/16/2012  Patient:  CINCH, ORMOND  Account Number:  [id number] Admit date:  03/10/2012  Clinical Social Worker:  Doree Albee  Date/time:  03/13/2012 05:00 PM  Clinical Social Work is seeking post-discharge placement for this patient at the following level of care:   SKILLED NURSING   (*CSW will update this form in Epic as items are completed)   03/13/2012  Patient/family provided with [facility] Department of Clinical Social Work's list of facilities offering this level of care within the geographic area requested by the patient (or if unable, by the patient's family).  03/13/2012  Patient/family informed of their freedom to choose among providers that offer the needed level of care, that participate in Medicare, Medicaid or managed care program needed by the patient, have an available bed and are willing to accept the patient.  03/13/2012  Patient/family informed of [facility]' ownership interest in [facility], as well as of the fact that they are under no obligation to receive care at this facility.  PASARR submitted to EDS on 03/13/2012 PASARR number received from EDS on 03/13/2012  FL2 transmitted to all facilities in geographic area requested by pt/family on  03/14/2012 FL2 transmitted to all facilities within larger geographic area on   Patient informed that his/her managed care company has contracts with or will negotiate with  certain facilities, including the following:     Patient/family informed of bed offers received:  03/16/12  Patient chooses bed at  [facility] Physician recommends and patient chooses bed at   [facility]   Patient to be transferred to [facility] on 03/19/12 Alvina Chou, LCSW)   Patient to be transferred to facility by ambulance Alvina Chou, LCSW)  The following physician request were entered in Epic:   Additional Comments:

## 2012-03-16 NOTE — Progress Notes (Signed)
Inpatient Diabetes Program Recommendations  AACE/ADA: New Consensus Statement on Inpatient Glycemic Control (2009)  Target Ranges:  Prepandial:   less than 140 mg/dL      Peak postprandial:   less than 180 mg/dL (1-2 hours)      Critically ill patients:  140 - 180 mg/dL   Reason for Visit: Results for GREIG, ALTERGOTT (MRN [id number]) as of 03/16/2012 14:47  Ref. Range 03/13/2012 05:35 03/16/2012 05:00  Glucose Latest Range: 70-99 mg/dL 409 (H) 811 (H)    Inpatient Diabetes Program Recommendations Correction (SSI): Please consider adding Novolog sensitve tid wc and HS  Note: Will follow.

## 2012-03-16 NOTE — Progress Notes (Signed)
Patient ID: [name], male   DOB: 11/17/1927, 78 y.o.   MRN: [id number] Subjective:  " I am weak and do not need to go back to my facility", denies chest pain Objective:  Vital Signs in the last 24 hours: Temp:  [97.7 F (36.5 C)-98.4 F (36.9 C)] 98.4 F (36.9 C) (07/01 0528) Pulse Rate:  [59-113] 99  (07/01 0949) Resp:  [18] 18  (07/01 0528) BP: (124-156)/(62-70) 156/68 mmHg (07/01 1132) SpO2:  [95 %-100 %] 100 % (07/01 0800) Weight:  [156 lb 8.4 oz (71 kg)] 156 lb 8.4 oz (71 kg) (07/01 0528)  Intake/Output from previous day: 06/30 0701 - 07/01 0700 In: 753 [P.O.:750; I.V.:3] Out: 2425 [Urine:2425] Intake/Output from this shift: Total I/O In: 243 [P.O.:240; I.V.:3] Out: 302 [Urine:300; Stool:2]  Physical Exam: Chronically ill appearing NAD HEENT: Unremarkable Neck:  No JVD, no thyromegally Lungs:  Clear with no wheezes. No rales.  HEART:  IRegular rate rhythm, no murmurs, no rubs, no clicks Abd:  soft, positive bowel sounds, no organomegally, no rebound, no guarding Ext:  2 plus pulses, no edema, no cyanosis, no clubbing Skin:  No rashes no nodules Neuro:  CN II through XII intact, motor grossly intact  Lab Results:  Basename 03/16/12 0500  WBC 10.6*  HGB 15.6  PLT 172    Basename 03/16/12 0500  NA 133*  K 4.1  CL 89*  CO2 37*  GLUCOSE 205*  BUN 37*  CREATININE 0.82   No results found for this basename: TROPONINI:2,CK,MB:2 in the last 72 hours Hepatic Function Panel No results found for this basename: PROT,ALBUMIN,AST,ALT,ALKPHOS,BILITOT,BILIDIR,IBILI in the last 72 hours No results found for this basename: CHOL in the last 72 hours No results found for this basename: PROTIME in the last 72 hours  Imaging: No results found.  Cardiac Studies: Tele - atrial fib with a RVR/CVR Assessment/Plan:  1. Atrial fib with a RVR/CVR - continue current meds. His beta blocker was uptitrated. His rates are reasonably well controlled. Continue current meds. 2.  Weakness - he refuses to exercise with cardiac rehab and is interested in nursing home placement/Rehab. He will be evaluated for placement.  3. COPD exacerbation - he is nearing baseline. He will continue his current meds. I would anticipate a total of 10 days of anti-biotic therapy.   LOS: 6 days    Buel Ream.D. 03/16/2012, 11:55 AM

## 2012-03-16 NOTE — Progress Notes (Signed)
ANTICOAGULATION CONSULT NOTE - Follow Up Consult  Pharmacy Consult for Coumadin Indication: atrial fibrillation  No Known Allergies  Patient Measurements: Height: 5\' 9"  (175.3 cm) Weight: 156 lb 8.4 oz (71 kg) IBW/kg (Calculated) : 70.7   Vital Signs: Temp: 98.4 F (36.9 C) (07/01 0528) Temp src: Oral (07/01 0528) BP: 124/66 mmHg (07/01 0528) Pulse Rate: 77  (07/01 0528)  Labs:  Basename 03/16/12 0500 03/15/12 0535 03/14/12 0539  HGB 15.6 -- --  HCT 48.0 -- --  PLT 172 -- --  APTT -- -- --  LABPROT 24.7* 34.1* 46.0*  INR 2.19* 3.31* 4.85*  HEPARINUNFRC -- -- --  CREATININE 0.82 -- --  CKTOTAL -- -- --  CKMB -- -- --  TROPONINI -- -- --    Estimated Creatinine Clearance: 70.7 ml/min (by C-G formula based on Cr of 0.82).  Assessment: 78 y.o. M resumed on warfarin from PTA for hx Afib with a SUPRAtherapeutic INR this a.m though trended down significantly (INR 2.19, goal of 2-3). PTA dose was noted to be 5 mg daily EXCEPT for 2.5 mg on Mon/Fri. Elevations in INR are likely related to concurrent steroid taper (now changed to po) and interaction with Levaquin (now d/ced) as both of these are known to increase warfarin sensitivity. CBC remains stable. Plan for SNF today.   Goal of Therapy:  INR 2-3   Plan:  1. Warfarin 5 mg x 1 dose at 1800 today 2. Will continue to monitor for any signs/symptoms of bleeding and will follow up with PT/INR in the a.m.

## 2012-03-16 NOTE — Progress Notes (Signed)
Pt had an episode of 5 beat run of V-tach, no s/s, BP 148/70 HR 113, notified MD, no order given yet; pt does have Bmet ordered in AM, will continue to monitor thanks,  Lavonda Jumbo RN

## 2012-03-16 NOTE — Progress Notes (Signed)
1115  Came to walk with pt. He declined adamantly saying he could not bear weight on legs and had to be lifted to [facility]. Pt stated he did not know why his legs are weaker now than when he walked with  Cardiac Rehab SAT. Physical therapy can evaluate prior to [place] seeing due to poor mobility. Will continue to follow.Alayja Armas DunlapRN

## 2012-03-17 LAB — CULTURE, BLOOD (ROUTINE X 2): Culture: NO GROWTH

## 2012-03-17 LAB — PROTIME-INR: INR: 2.58 — ABNORMAL HIGH (ref 0.00–1.49)

## 2012-03-17 MED ORDER — DILTIAZEM HCL ER COATED BEADS 360 MG PO CP24
360.0000 mg | ORAL_CAPSULE | Freq: Every day | ORAL | Status: DC
  Administered 2012-03-17 – 2012-03-19 (×3): 360 mg via ORAL
  Filled 2012-03-17 (×3): qty 1

## 2012-03-17 MED ORDER — WARFARIN SODIUM 2.5 MG PO TABS
2.5000 mg | ORAL_TABLET | Freq: Once | ORAL | Status: AC
  Administered 2012-03-17: 2.5 mg via ORAL
  Filled 2012-03-17: qty 1

## 2012-03-17 MED ORDER — MORPHINE SULFATE 15 MG PO TABS: 7.5000 mg | ORAL_TABLET | Freq: Four times a day (QID) | ORAL | Status: DC | PRN

## 2012-03-17 MED ORDER — MORPHINE SULFATE ER 15 MG PO TBCR
15.0000 mg | EXTENDED_RELEASE_TABLET | Freq: Every day | ORAL | Status: DC
  Administered 2012-03-17 – 2012-03-19 (×3): 15 mg via ORAL
  Filled 2012-03-17 (×3): qty 1

## 2012-03-17 MED ORDER — PREDNISONE 20 MG PO TABS
40.0000 mg | ORAL_TABLET | Freq: Every day | ORAL | Status: DC
  Administered 2012-03-18 – 2012-03-19 (×2): 40 mg via ORAL
  Filled 2012-03-17 (×3): qty 2

## 2012-03-17 NOTE — Progress Notes (Signed)
Physical Therapy Treatment Patient Details Name: RESEAN BRANDER MRN: [id number] DOB: [birth year] Today's Date: 03/17/2012 Time: 2841-3244 PT Time Calculation (min): 18 min  PT Assessment / Plan / Recommendation Comments on Treatment Session  Pt's SpO2 dropped to 78% on 2L O2 via [place] during activity RR increased to 32.   Increased O2 to 4L via [place] and SpO2 increased to mid 90%.  Pt significantly limited by LE weakness bilateral LEs "gave out" during stand pivot transfer requiring total assist for safe descent to chair.     Follow Up Recommendations  Skilled nursing facility    Barriers to Discharge        Equipment Recommendations  Defer to next venue    Recommendations for Other Services OT consult  Frequency Min 3X/week   Plan Discharge plan remains appropriate;Frequency remains appropriate    Precautions / Restrictions Precautions Precautions: Fall Precaution Comments: Decr functional capacity Restrictions Weight Bearing Restrictions: No   Pertinent Vitals/Pain No pain.      Mobility  Bed Mobility Bed Mobility: Supine to Sit;Sitting - Scoot to Edge of Bed Supine to Sit: 6: Modified independent (Device/Increase time);With rails;HOB elevated Sitting - Scoot to Edge of Bed: 5: Supervision;With rail Transfers Transfers: Sit to Stand;Stand to Sit;Stand Pivot Transfers Sit to Stand: 3: Mod assist;From elevated surface;With upper extremity assist;From bed Stand to Sit: 1: +1 Total assist;To chair/3-in-1 Stand Pivot Transfers: 1: +1 Total assist Details for Transfer Assistance: Very weak, with need to sit down almost immediately upon standing; Heaviliy dependent on UE push, and UE support on RW; Required physical assist to steady RW; decreased control of descent with stand to sit Ambulation/Gait Ambulation/Gait Assistance: Not tested (comment) Ambulation/Gait Assistance Details: Pt unable to ambulate secondary to LE weakness.     Exercises Total Joint Exercises Long Arc Quad: 10  reps;Both;Seated   PT Diagnosis:    PT Problem List:   PT Treatment Interventions:     PT Goals Acute Rehab PT Goals PT Goal Formulation: With patient Time For Goal Achievement: 03/29/12 Potential to Achieve Goals: Fair Pt will go Supine/Side to Sit: with modified independence PT Goal: Supine/Side to Sit - Progress: Met Pt will go Sit to Supine/Side: with modified independence Pt will go Sit to Stand: with modified independence PT Goal: Sit to Stand - Progress: Progressing toward goal Pt will go Stand to Sit: with modified independence PT Goal: Stand to Sit - Progress: Progressing toward goal Pt will Ambulate: >150 feet;with supervision;with rolling walker PT Goal: Ambulate - Progress: Not progressing  Visit Information  Last PT Received On: 03/17/12 Assistance Needed: +2    Subjective Data  Subjective: I feel better.    Cognition  Overall Cognitive Status: Appears within functional limits for tasks assessed/performed Arousal/Alertness: Lethargic Orientation Level: Appears intact for tasks assessed Behavior During Session: [facility] for tasks performed    Balance  Balance Balance Assessed: No  End of Session PT - End of Session Equipment Utilized During Treatment: Oxygen;Gait belt Activity Tolerance: Patient limited by fatigue;Patient limited by pain Patient left: in chair;with call bell/phone within reach;with nursing in room Nurse Communication: Mobility status   GP     Roma Bondar 03/17/2012, 4:02 PM Markcus Lazenby L. Azara Gemme DPT [phone].

## 2012-03-17 NOTE — Progress Notes (Signed)
Subjective:  Still very weak.  Heart rate remains 140s atrial fib. 6 beats VT during the night.  Objective:  Vital Signs in the last 24 hours: Temp:  [97.6 F (36.4 C)-98.1 F (36.7 C)] 98 F (36.7 C) (07/02 0500) Pulse Rate:  [64-99] 75  (07/02 0500) Resp:  [18] 18  (07/02 0500) BP: (146-156)/(60-77) 146/77 mmHg (07/02 0500) SpO2:  [92 %-100 %] 98 % (07/02 0749) Weight:  [152 lb (68.947 kg)] 152 lb (68.947 kg) (07/02 0500)  Intake/Output from previous day: 07/01 0701 - 07/02 0700 In: 1206 [P.O.:1200; I.V.:6] Out: 2102 [Urine:2100; Stool:2] Intake/Output from this shift:       . antiseptic oral rinse  15 mL Mouth Rinse BID  . bisoprolol  10 mg Oral Daily  . budesonide  0.25 mg Nebulization QID  . cholecalciferol  2,000 Units Oral Daily  . clopidogrel  75 mg Oral Q breakfast  . digoxin  250 mcg Oral Daily  . diltiazem  240 mg Oral Daily  . feeding supplement  237 mL Oral BID BM  . furosemide  60 mg Oral Daily  . guaiFENesin  1,200 mg Oral BID  . isosorbide mononitrate  60 mg Oral Daily  . morphine  7.5 mg Oral BID  . pantoprazole  40 mg Oral Q1200  . predniSONE  20 mg Oral BID WC  . senna  2 tablet Oral BID  . sodium chloride  3 mL Intravenous Q12H  . Tamsulosin HCl  0.4 mg Oral QPC supper  . warfarin  5 mg Oral ONCE-1800  . Warfarin - Pharmacist Dosing Inpatient   Does not apply q1800      Physical Exam: The patient appears to be in no distress.  Head and neck exam reveals that the pupils are equal and reactive.  The extraocular movements are full.  There is no scleral icterus.  Mouth and pharynx are benign.  No lymphadenopathy.  No carotid bruits.  The jugular venous pressure is normal.  Thyroid is not enlarged or tender.  Chest is clear to percussion and auscultation.  No rales or rhonchi.  Expansion of the chest is symmetrical.  Heart reveals no abnormal lift or heave.  First and second heart sounds are normal.  There is no murmur gallop rub or  click.  The abdomen is soft and nontender.  Bowel sounds are normoactive.  There is no hepatosplenomegaly or mass.  There are no abdominal bruits.  Extremities reveal no phlebitis or edema.  Pedal pulses are good.  There is no cyanosis or clubbing.  Neurologic exam is normal strength and no lateralizing weakness.  No sensory deficits.  Integument reveals no rash  Lab Results:  Basename 03/16/12 0500  WBC 10.6*  HGB 15.6  PLT 172    Basename 03/16/12 0500  NA 133*  K 4.1  CL 89*  CO2 37*  GLUCOSE 205*  BUN 37*  CREATININE 0.82   No results found for this basename: TROPONINI:2,CK,MB:2 in the last 72 hours Hepatic Function Panel No results found for this basename: PROT,ALBUMIN,AST,ALT,ALKPHOS,BILITOT,BILIDIR,IBILI in the last 72 hours No results found for this basename: CHOL in the last 72 hours No results found for this basename: PROTIME in the last 72 hours  Imaging: Imaging results have been reviewed  Cardiac Studies: Telemetry atrial fib with rapid VR Assessment/Plan:  Atrial fib with rapid VR Severe COPD  Plan:  Already on bisoprolol and digoxin. Will uptitrate diltiazem to 360 mg daily   LOS: 7 days  [name] 03/17/2012, 8:02 AM

## 2012-03-17 NOTE — Progress Notes (Signed)
Pt c/o chest pain 2-3/10 scale.  BP150/88, P 66.  Also spoke with PT, who is In to see pt at this time, stated he will come again when is better.  Pt is asymptomatic and notified R. Barrett.  Instructed to continue to monitor.  Also made her aware that pt HR 132 this am and asymptomatic also.  Amanda Pea, Charity fundraiser.

## 2012-03-17 NOTE — Progress Notes (Signed)
Informed by NS pt's HR 150 & not new for pt.  Currently in pts room BP 149/98, p74.  Asymptomatic.  Resting quietly in bed. Will cont. To monitor.  Amanda Pea, RN>

## 2012-03-17 NOTE — Progress Notes (Signed)
Noted CP event. Will f/u tomorrow. Ethelda Chick CES, ACSM

## 2012-03-17 NOTE — Progress Notes (Signed)
Nutrition Follow-up  Intervention:   No new nutrition interventions at this time RD will continue to follow  Assessment:   Pt states he is not eating his meals, but is drinking Ensure. Two Ensure in room were unopened. Pt did not want to talk today, too many people bothering him. Denies any additional needs at this time.  Pt now with a palliative care consult.   PO intake: 75% most meals  Diet Order:  Heart Supplements: Ensure Complete BID Meds: Scheduled Meds:   . antiseptic oral rinse  15 mL Mouth Rinse BID  . bisoprolol  10 mg Oral Daily  . budesonide  0.25 mg Nebulization QID  . cholecalciferol  2,000 Units Oral Daily  . clopidogrel  75 mg Oral Q breakfast  . digoxin  250 mcg Oral Daily  . diltiazem  360 mg Oral Daily  . feeding supplement  237 mL Oral BID BM  . furosemide  60 mg Oral Daily  . guaiFENesin  1,200 mg Oral BID  . isosorbide mononitrate  60 mg Oral Daily  . morphine  15 mg Oral Daily  . pantoprazole  40 mg Oral Q1200  . predniSONE  40 mg Oral Q breakfast  . senna  2 tablet Oral BID  . sodium chloride  3 mL Intravenous Q12H  . Tamsulosin HCl  0.4 mg Oral QPC supper  . warfarin  2.5 mg Oral ONCE-1800  . warfarin  5 mg Oral ONCE-1800  . Warfarin - Pharmacist Dosing Inpatient   Does not apply q1800  . DISCONTD: diltiazem  240 mg Oral Daily  . DISCONTD: morphine  7.5 mg Oral BID  . DISCONTD: predniSONE  20 mg Oral BID WC   Continuous Infusions:  PRN Meds:.sodium chloride, acetaminophen, bisacodyl, guaiFENesin-dextromethorphan, levalbuterol, levalbuterol, morphine, nitroGLYCERIN, ondansetron (ZOFRAN) IV, sodium chloride, DISCONTD: morphine  Labs:  CMP     Component Value Date/Time   NA 133* 03/16/2012 0500   K 4.1 03/16/2012 0500   CL 89* 03/16/2012 0500   CO2 37* 03/16/2012 0500   GLUCOSE 205* 03/16/2012 0500   BUN 37* 03/16/2012 0500   CREATININE 0.82 03/16/2012 0500   CALCIUM 8.8 03/16/2012 0500   PROT 6.6 03/10/2012 1801   ALBUMIN 3.8 03/10/2012 1801   AST 18  03/10/2012 1801   ALT 20 03/10/2012 1801   ALKPHOS 62 03/10/2012 1801   BILITOT 0.4 03/10/2012 1801   GFRNONAA 81* 03/16/2012 0500   GFRAA >90 03/16/2012 0500     Intake/Output Summary (Last 24 hours) at 03/17/12 1441 Last data filed at 03/17/12 1100  Gross per 24 hour  Intake    723 ml  Output   1575 ml  Net   -852 ml    Weight Status:  152 lbs, down 4 lbs overnight, previously was stable with admission weight  Re-estimated needs:  1750-1900 kcal, 75-85 gm protien  Nutrition Dx:  Inadequate oral intake r/t increased WOB, SOB, poor appetite AEB weight loss of 8.7% on 6 days  Goal:  PO intake to promote weight stabilization  Monitor:  PO intake, weight, labs, I/O's   Clarene Duke RD, LDN Pager [phone] After Hours pager [phone]

## 2012-03-17 NOTE — Progress Notes (Signed)
Name: [name] MRN: [id number] DOB: [birth year]    LOS: 7  Referring Provider:  Dr. 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  Was seen in office on 6/20 and given a prednisone taper then followed up on 6/24 where he was noted to have purulent sputum.  Levaquin was called for the patient.  The patient reports to the ED on 6/25 with chest pain and dyspnea.  In the ED the patient was noted new onset a-fib.  PCCM was consulted for COPD exacerbation management.   Current Status:  -morphine helps somewhat -RVR 140s - but looks deconditioned and depressed; unwilling to participate in rehab -c/o green phlegm  Vital Signs: Temp:  [97.6 F (36.4 C)-98.1 F (36.7 C)] 98 F (36.7 C) (07/02 0500) Pulse Rate:  [64-100] 100  (07/02 1052) Resp:  [18-20] 20  (07/02 0921) BP: (146-151)/(60-98) 148/90 mmHg (07/02 1051) SpO2:  [92 %-100 %] 99 % (07/02 1100) Weight:  [68.947 kg (152 lb)] 68.947 kg (152 lb) (07/02 0500)  Physical Examination: General:  Chronically ill appearing elderly male, depressed affect Psych: flat affect Neuro:  Alert and oriented, moving all ext to command. HEENT:  [place]/AT, PERRL, EOM-I and DMM. Neck:  Supple, -LAN and -thyromegally. Cardiovascular:  irreg, Nl S1/S2, -M/R/G. Lungs:  Distant BS, diffuse end exp wheezes decreased Abdomen:  Soft, NT, ND and +BS. Musculoskeletal:  -edema and -tenderness, - clubbing. Skin:  Thin but intact.   Principal Problem:  *Chest pain Active Problems:  CAD (coronary artery disease)  HLD (hyperlipidemia)  COPD exacerbation  Acute on chronic combined systolic and diastolic heart failure  Hypoxemia  Pulmonary edema  Type 2 diabetes mellitus  Permanent atrial fibrillation  Depression   ASSESSMENT AND PLAN  PULMONARY   CXR:  Cardiomegally and COPD, mild pulmonary  edema.  A:  78 year old male with severe COPD presenting with COPD exacerbation and a-fib with RVR. S/p levaquin RX P:   -Pred 40 daily with slow taper over 2-3 weeks , once improved --PRN xopenex. -Nebulized budesonide. - Change to SR morphine 15 mg & use 5mg  PO q 6h prn  for dyspnea & titrate to effect -DNR noted - will do roflumilas as opd  CARDIOVASCULAR  Lab 03/11/12 0600 03/11/12 0001 03/10/12 1801 03/10/12 1207  TROPONINI <0.30 <0.30 <0.30 <0.30  LATICACIDVEN -- -- -- --  PROBNP -- -- 2962.0* --   ECG:  A-fib. Lines: PIV.  A: CHF and a-fib history. P:  Per cards, increased cardizem, ok with bisoprolol  RENAL  Lab 03/16/12 0500 03/13/12 0535 03/11/12 0600 03/10/12 1801 03/10/12 1142  NA 133* 133* 139 139 137  K 4.1 3.9 -- -- --  CL 89* 92* 96 101 99  CO2 37* 30 29 27 27   BUN 37* 32* 24* 21 22  CREATININE 0.82 0.90 1.04 0.81 0.80  CALCIUM 8.8 8.8 9.9 9.8 9.9  MG -- -- -- 2.3 --  PHOS -- -- -- -- --   Intake/Output      07/01 0701 - 07/02 0700 07/02 0701 - 07/03 0700   P.O. 1200    I.V. (mL/kg) 6 (0.1)    Total Intake(mL/kg) 1206 (17.5)    Urine (mL/kg/hr) 2100 (1.3)    Stool 2    Total Output 2102    Net -896  Foley:  None  A:  No active issues. P:   F/U BMET as patient is being diuresed.  HEMATOLOGIC  Lab 03/17/12 0628 03/16/12 0500 03/15/12 0535 03/14/12 0539 03/13/12 0535 03/11/12 0600 03/10/12 1142  HGB -- 15.6 -- -- 15.1 16.3 16.7  HCT -- 48.0 -- -- 44.7 49.9 49.0  PLT -- 172 -- -- 184 206 184  INR 2.58* 2.19* 3.31* 4.85* 4.07* -- --  APTT -- -- -- -- -- -- --   A:  INR / dosing per PHARM P:  Coumadin per pharmacy.  INFECTIOUS  Lab 03/16/12 0500 03/13/12 0535 03/11/12 0600 03/10/12 1142  WBC 10.6* 15.0* 10.4 10.0  PROCALCITON -- -- -- --   Cultures: Blood 6/25>>>ng Urine 6/25>>>ng Sputum 6/25>>>oral flora Antibiotics: Levaquin 6/24>>>  A:  Purulent bronchitis.s/p levaquin P:  Monitor   ENDOCRINE  Lab 03/11/12  0734  GLUCAP 124*   A:  No diabetes history.   P:   Follow sugars while on steroids.   NEURO A: He appears depressed as he did in 23-Mar-2013when he yearned for his dead wife but refused pscyh  Consult and medication P:may need Rx but likely related to his prolonged illness   Cyril Mourning MD. Tonny Bollman. [facility] Pager [phone] If no response call [phone]    03/17/2012 11:33 AM

## 2012-03-17 NOTE — Progress Notes (Signed)
PT Cancellation Note  Treatment cancelled today due to medical issues with patient which prohibited therapy. Pt with c/o chest pain.  RN assessing pt.  Will check back later today to see if pt is appropriate for PT.    Britteny Fiebelkorn 03/17/2012, 2:36 PM Maddalynn Barnard L. Shefali Ng DPT [phone]

## 2012-03-17 NOTE — Progress Notes (Signed)
ANTICOAGULATION CONSULT NOTE - Follow Up Consult  Pharmacy Consult for Coumadin Indication: atrial fibrillation  No Known Allergies  Patient Measurements: Height: 5\' 9"  (175.3 cm) Weight: 152 lb (68.947 kg) (bed scale) IBW/kg (Calculated) : 70.7   Vital Signs: Temp: 98 F (36.7 C) (07/02 0500) Temp src: Oral (07/02 0500) BP: 146/77 mmHg (07/02 0500) Pulse Rate: 75  (07/02 0500)  Labs:  Basename 03/17/12 0628 03/16/12 0500 03/15/12 0535  HGB -- 15.6 --  HCT -- 48.0 --  PLT -- 172 --  APTT -- -- --  LABPROT 28.1* 24.7* 34.1*  INR 2.58* 2.19* 3.31*  HEPARINUNFRC -- -- --  CREATININE -- 0.82 --  CKTOTAL -- -- --  CKMB -- -- --  TROPONINI -- -- --    Estimated Creatinine Clearance: 68.9 ml/min (by C-G formula based on Cr of 0.82).  Assessment: 78 y.o. M resumed on warfarin from PTA for hx Afib with a SUPRAtherapeutic INR. PTA dose was noted to be 5 mg daily EXCEPT for 2.5 mg on Mon/Fri. INR = 2.58 today. No complication. Still too weak for dc. Titrating meds for afib.    Goal of Therapy:  INR 2-3   Plan:  1. Warfarin 2.5 mg x 1 dose at 1800 today 2. Will continue to monitor for any signs/symptoms of bleeding and will follow up with PT/INR in the a.m.

## 2012-03-18 LAB — BASIC METABOLIC PANEL WITH GFR
BUN: 33 mg/dL — ABNORMAL HIGH (ref 6–23)
CO2: 41 meq/L (ref 19–32)
Calcium: 9.4 mg/dL (ref 8.4–10.5)
Chloride: 87 meq/L — ABNORMAL LOW (ref 96–112)
Creatinine, Ser: 0.8 mg/dL (ref 0.50–1.35)
GFR calc Af Amer: >90 mL/min
GFR calc non Af Amer: 82 mL/min — ABNORMAL LOW
Glucose, Bld: 184 mg/dL — ABNORMAL HIGH (ref 70–99)
Potassium: 4.4 meq/L (ref 3.5–5.1)
Sodium: 134 meq/L — ABNORMAL LOW (ref 135–145)

## 2012-03-18 MED ORDER — MORPHINE SULFATE 15 MG PO TABS: 7.5000 mg | ORAL_TABLET | Freq: Four times a day (QID) | ORAL | Status: DC | PRN

## 2012-03-18 NOTE — Progress Notes (Signed)
Patient can d/c tomorrow please contact Tammie at  [phone] at  [facility]. D/C will need to be sent to facility.    Sabino Niemann, MSW, [employer] [phone]

## 2012-03-18 NOTE — Progress Notes (Signed)
Patient: [name] Date of Encounter: 03/18/2012, 12:14 PM Admit date: 03/10/2012     Subjective  [name] reports not feeling well. He states his SOB is not much better. He is tired. He denies chest pain, palpitations or dizziness.    Objective  Physical Exam: Vitals: BP 108/66  Pulse 89  Temp 97.7 F (36.5 C) (Oral)  Resp 22  Ht 5\' 9"  (1.753 m)  Wt 151 lb 14.4 oz (68.901 kg)  BMI 22.43 kg/m2  SpO2 94% General: Well developed, elderly 78 year old male in no acute distress. Neck: Supple. JVD not elevated. Lungs: Significantly diminished breath sounds bilaterally. No wheezes, rales, or rhonchi. Accessory muscle use noted. Heart: Irregularly irregular. S1 S2 without murmur, rub, or gallop.  Abdomen: Soft, non-distended. Extremities: No clubbing or cyanosis. No edema.  Distal pedal pulses are 2+ and equal bilaterally. Neuro: Alert and oriented X 3. Moves all extremities spontaneously. No focal deficits.  Intake/Output: Intake/Output Summary (Last 24 hours) at 03/18/12 1214 Last data filed at 03/18/12 1100  Gross per 24 hour  Intake   1263 ml  Output   1300 ml  Net    -37 ml   Inpatient Medications:  . antiseptic oral rinse  15 mL Mouth Rinse BID  . bisoprolol  10 mg Oral Daily  . budesonide  0.25 mg Nebulization QID  . cholecalciferol  2,000 Units Oral Daily  . clopidogrel  75 mg Oral Q breakfast  . digoxin  250 mcg Oral Daily  . diltiazem  360 mg Oral Daily  . feeding supplement  237 mL Oral BID BM  . furosemide  60 mg Oral Daily  . guaiFENesin  1,200 mg Oral BID  . isosorbide mononitrate  60 mg Oral Daily  . morphine  15 mg Oral Daily  . pantoprazole  40 mg Oral Q1200  . predniSONE  40 mg Oral Q breakfast  . senna  2 tablet Oral BID  . sodium chloride  3 mL Intravenous Q12H  . tamsulosin HCl  0.4 mg Oral QPC supper  . warfarin  2.5 mg Oral ONCE-1800   Labs:  [facility] 03/18/12 0650 03/16/12 0500  NA 134* 133*  K 4.4 4.1  CL 87* 89*  CO2 41* 37*   GLUCOSE 184* 205*  BUN 33* 37*  CREATININE 0.80 0.82  CALCIUM 9.4 8.8  MG 2.5 --  PHOS -- --    Basename 03/16/12 0500  WBC 10.6*  NEUTROABS --  HGB 15.6  HCT 48.0  MCV 93.4  PLT 172   Echocardiogram: February 2013 - Left ventricle: The cavity size was normal. Wall thickness was increased in a pattern of mild LVH. Indeterminant diastolic function. Systolic function was mildly to moderately reduced. The estimated ejection fraction was in the range of 40% to 45%. Basal to mid anterolateral and posterior severe hypokinesis. - Aortic valve: Poorly visualized. There was no stenosis. - Mitral valve: No significant regurgitation. - Left atrium: The atrium was mildly dilated. - Right ventricle: The cavity size was mildly dilated. Pacer wire or catheter noted in right ventricle. Systolic function was normal. - Right atrium: The atrium was mildly dilated. - Pulmonary arteries: No complete TR doppler jet so unable to estimate PA systolic pressure. - Inferior vena cava: The vessel was normal in size; the respirophasic diameter changes were in the normal range (=50%); findings are consistent with normal central venous pressure. - Pericardium, extracardiac: A trivial pericardial effusion was identified.  Telemetry: atrial fibrillation, currently  rate controlled in the 90s   Assessment and Plan  1. Atrial fibrillation - rate controlled currently; continue digoxin and diltiazem for rate control and warfarin for embolic prophylaxis 2. COPD, severe - per PCCM; appreciate their assistance   Dr. Ladona Ridgel to see and make further recommendations. Signed, [name]  Cardiology Attending  Patient seen and examined. Discussed situation with his daughter, social work, and team members. He has end-stage COPD, now on 4 lit oxygen, atrial fib with an RVR now with a controlled rate. He will need PT/OT. He is currently refusing hospice but this is ultimately were he is headed. I would like him to  receive a total of 14 days of anti-biotics. He is ready for discharge though he remains ill. Going forward, recurrent chest pain will not be aggressively treated due to his other severe comorbidities.   Lewayne Bunting, M.D.

## 2012-03-18 NOTE — Progress Notes (Signed)
Name: [name] MRN: [id number] DOB: 09-10-1928    LOS: 8  Referring Provider:  Dr. 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  Was seen in office on 6/20 and given a prednisone taper then followed up on 6/24 where he was noted to have purulent sputum.  Levaquin was called for the patient.  The patient reports to the ED on 6/25 with chest pain and dyspnea.  In the ED the patient was noted new onset a-fib.  PCCM was consulted for COPD exacerbation management.   Current Status:  -morphine helps somewhat -c/o chest pain 7/2 - no more episodes overnight - but looks deconditioned and depressed; unwilling to participate in rehab -c/o green phlegm  Vital Signs: Temp:  [97.4 F (36.3 C)-97.8 F (36.6 C)] 97.7 F (36.5 C) (07/03 0433) Pulse Rate:  [78-89] 89  (07/03 0433) Resp:  [20-32] 22  (07/03 0433) BP: (108-156)/(66-73) 108/66 mmHg (07/03 0433) SpO2:  [78 %-99 %] 94 % (07/03 0433) Weight:  [68.901 kg (151 lb 14.4 oz)] 68.901 kg (151 lb 14.4 oz) (07/03 0433)  Physical Examination: General:  Chronically ill appearing elderly male, depressed affect, wob remains high  Psych: flat affect Neuro:  Alert and oriented, moving all ext to command. HEENT:  [place]/AT, PERRL, EOM-I and DMM. Neck:  Supple, -LAN and -thyromegally. Cardiovascular:  irreg, Nl S1/S2, -M/R/G. Lungs:  Distant BS, diffuse end exp wheezes decreased Abdomen:  Soft, NT, ND and +BS. Musculoskeletal:  -edema and -tenderness, - clubbing. Skin:  Thin but intact.   Principal Problem:  *Chest pain Active Problems:  CAD (coronary artery disease)  HLD (hyperlipidemia)  COPD exacerbation  Acute on chronic combined systolic and diastolic heart failure  Hypoxemia  Pulmonary edema  Type 2 diabetes mellitus  Permanent atrial fibrillation   Depression   ASSESSMENT AND PLAN  PULMONARY   CXR:  Cardiomegally and COPD, mild pulmonary edema.  A:  78 year old male with severe COPD presenting with COPD exacerbation and a-fib with RVR. S/p levaquin RX P:   -Pred 40 daily with slow taper over 2-3 weeks , once improved --PRN xopenex. -Nebulized budesonide. - Change to SR morphine 15 mg & use MSIR 7.5mg  PO q 6h prn  for dyspnea & titrate to effect -DNR noted - will do roflumilas as opd  CARDIOVASCULAR No results found for this basename: TROPONINI:5,LATICACIDVEN:5, O2SATVEN:5,PROBNP:5 in the last 168 hours ECG:  A-fib. Lines: PIV.  A: CHF and a-fib history. P:  Per cards, increased cardizem, ok with bisoprolol  HEMATOLOGIC  Lab 03/18/12 0650 03/17/12 0628 03/16/12 0500 03/15/12 0535 03/14/12 0539 03/13/12 0535  HGB -- -- 15.6 -- -- 15.1  HCT -- -- 48.0 -- -- 44.7  PLT -- -- 172 -- -- 184  INR 3.27* 2.58* 2.19* 3.31* 4.85* --  APTT -- -- -- -- -- --   A:  INR / dosing per PHARM P:  Coumadin per pharmacy.  INFECTIOUS  Lab 03/16/12 0500 03/13/12 0535  WBC 10.6* 15.0*  PROCALCITON -- --   Cultures: Blood 6/25>>>ng Urine 6/25>>>ng Sputum 6/25>>>oral flora Antibiotics: Levaquin 6/24>>>  A:  Purulent bronchitis.s/p levaquin P:  Monitor   ENDOCRINE No results found for this basename: GLUCAP:5 in the last 168 hours A:  No diabetes history.   P:   Follow sugars while on steroids.  NEURO A: He appears depressed as he did in 2013/03/04when he yearned for his dead wife but refused pscyh  Consult and medication P:may need Rx but likely related to his prolonged illness  SNF dc being planned, he refused hospice PCCM will see again on Friday  Cyril Mourning MD. [facility]. [place] Pager [phone] If no response call [phone]    03/18/2012 11:49 AM

## 2012-03-18 NOTE — Progress Notes (Signed)
CSW will continue to follow and assist with all d/c needs.  Sabino Niemann, MSW, [employer] [phone]

## 2012-03-18 NOTE — Progress Notes (Signed)
ANTICOAGULATION CONSULT NOTE - Follow Up Consult  Pharmacy Consult for Coumadin Indication: atrial fibrillation  No Known Allergies  Patient Measurements: Height: 5\' 9"  (175.3 cm) Weight: 151 lb 14.4 oz (68.901 kg) (bed scale) IBW/kg (Calculated) : 70.7   Vital Signs: Temp: 97.7 F (36.5 C) (07/03 0433) Temp src: Oral (07/03 0433) BP: 108/66 mmHg (07/03 0433) Pulse Rate: 89  (07/03 0433)  Labs:  Basename 03/18/12 0650 03/17/12 0628 03/16/12 0500  HGB -- -- 15.6  HCT -- -- 48.0  PLT -- -- 172  APTT -- -- --  LABPROT 33.8* 28.1* 24.7*  INR 3.27* 2.58* 2.19*  HEPARINUNFRC -- -- --  CREATININE 0.80 -- 0.82  CKTOTAL -- -- --  CKMB -- -- --  TROPONINI -- -- --    Estimated Creatinine Clearance: 70.6 ml/min (by C-G formula based on Cr of 0.8).  Assessment: 78 y.o. M resumed on warfarin from PTA for hx Afib with a SUPRAtherapeutic INR. PTA dose was noted to be 5 mg daily EXCEPT for 2.5 mg on Mon/Fri. INR = 3.27 today. Had some CP yesterday. INR cont to trend up.     Goal of Therapy:  INR 2-3   Plan:  1. No coumadin today 2. Will continue to monitor for any signs/symptoms of bleeding and will follow up with PT/INR in the a.m.

## 2012-03-19 LAB — CBC
HCT: 46.1 % (ref 39.0–52.0)
Hemoglobin: 15.8 g/dL (ref 13.0–17.0)
MCH: 31.3 pg (ref 26.0–34.0)
MCHC: 34.3 g/dL (ref 30.0–36.0)
MCV: 91.5 fL (ref 78.0–100.0)

## 2012-03-19 LAB — PROTIME-INR: Prothrombin Time: 30.3 seconds — ABNORMAL HIGH (ref 11.6–15.2)

## 2012-03-19 MED ORDER — WARFARIN SODIUM 2.5 MG PO TABS
2.5000 mg | ORAL_TABLET | Freq: Once | ORAL | Status: DC
  Filled 2012-03-19: qty 1

## 2012-03-19 MED ORDER — PANTOPRAZOLE SODIUM 40 MG PO TBEC: 40.0000 mg | DELAYED_RELEASE_TABLET | Freq: Every day | ORAL | Status: DC

## 2012-03-19 MED ORDER — MORPHINE SULFATE 15 MG PO TABS: 7.5000 mg | ORAL_TABLET | Freq: Four times a day (QID) | ORAL | Status: AC | PRN

## 2012-03-19 MED ORDER — PREDNISONE 20 MG PO TABS: 40.0000 mg | ORAL_TABLET | Freq: Every day | ORAL | Status: DC

## 2012-03-19 MED ORDER — MORPHINE SULFATE ER 15 MG PO TBCR: 15.0000 mg | EXTENDED_RELEASE_TABLET | Freq: Every day | ORAL | Status: DC

## 2012-03-19 MED ORDER — BISOPROLOL FUMARATE 10 MG PO TABS: 10.0000 mg | ORAL_TABLET | Freq: Every day | ORAL | Status: DC

## 2012-03-19 MED ORDER — BUDESONIDE 0.25 MG/2ML IN SUSP: 0.2500 mg | Freq: Four times a day (QID) | RESPIRATORY_TRACT | Status: DC

## 2012-03-19 MED ORDER — MOXIFLOXACIN HCL 400 MG PO TABS
400.0000 mg | ORAL_TABLET | Freq: Every day | ORAL | Status: DC
  Administered 2012-03-19: 400 mg via ORAL
  Filled 2012-03-19: qty 1

## 2012-03-19 MED ORDER — ENSURE COMPLETE PO LIQD: 237.0000 mL | Freq: Two times a day (BID) | ORAL | Status: DC

## 2012-03-19 MED ORDER — WARFARIN SODIUM 2.5 MG PO TABS: 2.5000 mg | ORAL_TABLET | Freq: Once | ORAL | Status: DC

## 2012-03-19 MED ORDER — DILTIAZEM HCL ER COATED BEADS 360 MG PO CP24: 360.0000 mg | ORAL_CAPSULE | Freq: Every day | ORAL | Status: DC

## 2012-03-19 MED ORDER — FUROSEMIDE 20 MG PO TABS: 60.0000 mg | ORAL_TABLET | Freq: Every day | ORAL | Status: DC

## 2012-03-19 MED ORDER — MOXIFLOXACIN HCL 400 MG PO TABS: 400.0000 mg | ORAL_TABLET | Freq: Every day | ORAL | Status: AC

## 2012-03-19 NOTE — Progress Notes (Signed)
   CARE MANAGEMENT NOTE 03/19/2012  Patient:  [name], [name]   Account Number:  [id number]  Date Initiated:  03/11/2012  Documentation initiated by:  Junius Creamer  Subjective/Objective Assessment:   adm w at fib     Action/Plan:   lives indep at [facility], pcp dr Buren Kos   Anticipated DC Date:  03/19/2012   Anticipated DC Plan:  SKILLED NURSING FACILITY  In-house referral  Clinical Social Worker      DC Planning Services  CM consult      Choice offered to / List presented to:  C-1 Patient           Status of service:  Completed, signed off Medicare Important Message given?   (If response is "NO", the following Medicare IM given date fields will be blank) Date Medicare IM given:   Date Additional Medicare IM given:    Discharge Disposition:  SKILLED NURSING FACILITY  Per UR Regulation:  Reviewed for med. necessity/level of care/duration of stay  If discussed at Long Length of Stay Meetings, dates discussed:    Comments:  03/19/2012 Pt for d/c today to SNF. Johny Shock RN MPH [phone]   6/28 14:08p debbie dowell rn,bsn met w hosp rep-patient-da in room. da would like to start working on motorized w/c and will need prescription to get this started. it may take awhile to get thru medicare red tape for motorized w/c. she and pt now thinking about short term snf for rehab prior to returning to indep vs assisted living at [facility]. they prefer [facility], he has been there before for rehab. sw ref made.  6/28 11:35a debbie dowell rn,bsn spoke w pt. he plans to return to [facility] indep living hopefully. he has hospice ref and gave pt [facility] hospice agency list.he has worked w tommie np at [facility] and would like them again for [facility] hospice. ref to [facility] for [facility] hospice. pt has neb and rolator but may need home o2. will cont to follow.  6/26 8:50a debbie dowell rn,bsn [phone]

## 2012-03-19 NOTE — Discharge Summary (Signed)
ELECTROPHYSIOLOGY DISCHARGE SUMMARY    Patient ID: [name],  MRN: [id number], DOB/AGE: 12-15-1927 78 y.o.  Admit date: 03/10/2012 Discharge date: 03/19/2012  Primary Care Physician: Buren Kos, MD Primary Cardiologist: Patty Sermons, MD and Ladona Ridgel, MD Pulmonologist: Marchelle Gearing, MD  Primary Discharge Diagnosis:  1. Atrial fibrillation with RVR 2. Endstage COPD (Gold stage IV) with exacerbation   Secondary Discharge Diagnoses:  1. Ischemic cardiomyopathy, EF 35% by cardiac catheterization Nov 2012 2. Chronic systolic CHF 3. CAD 4. Dyslipidemia 5. DM 6. HTN 7. PVD 8. GERD 9. Osteoarthritis  Procedures This Admission:  None   History and Hospital Course:  Mr. [name] is an 78 year old gentleman who was a resident at [facility] and was brought to ER by EMS having chest pain and increased SOB. He was evaluated last week by his pulmonologist for COPD exacerbation and was started on prednisone and Levaquin. He reported chronic cough and gray sputum. On admission he was in AF with RVR and was started on IV diltiazem for rate control. His chest pain resolved without intervention. His cardiac enzymes were negative. He was treated conservatively given his severe, endstage lung disease. He was continued on medical therapy for known CAD. His warfarin was continued for embolic prophylaxis. On day 2, IV diltiazem was discontinued. His bisoprolol and PO diltiazem were up-titrated with improved rate control. His digoxin was continued. Pulmonary was consulted and assisted with managing his pulmonary medications. His breathing returned to baseline. Given his debility, discharge to a skilled nursing facility was recommended. He has been seen, examined and deemed stable for discharge to SNF today by Dr. Lewayne Bunting.  Discharge Vitals: Blood pressure 103/69, pulse 97, temperature 97.4 F (36.3 C), temperature source Oral, resp. rate 18, height 5\' 9"  (1.753 m), weight 152 lb 14.4 oz (69.355  kg), SpO2 98.00%.   Labs: Component Value Date   WBC 18.8* 03/19/2012   HGB 15.8 03/19/2012   HCT 46.1 03/19/2012   MCV 91.5 03/19/2012   PLT 164 03/19/2012    Lab 03/18/12 0650  NA 134*  K 4.4  CL 87*  CO2 41*  BUN 33*  CREATININE 0.80  CALCIUM 9.4  PROT --  BILITOT --  ALKPHOS --  ALT --  AST --  GLUCOSE 184*   Lab Results  Component Value Date   CKTOTAL 39 03/11/2012   CKMB 3.8 03/11/2012   TROPONINI <0.30 03/11/2012    Lab Results  Component Value Date   CHOL 135 03/11/2012   CHOL 105 10/11/2011   CHOL 128 08/15/2011   Lab Results  Component Value Date   HDL 32* 03/11/2012   HDL 36.00* 10/11/2011   HDL 32* 08/15/2011   Lab Results  Component Value Date   LDLCALC 66 03/11/2012   LDLCALC 39 10/11/2011   LDLCALC 67 08/15/2011   Lab Results  Component Value Date   TRIG 187* 03/11/2012   TRIG 149.0 10/11/2011   TRIG 146 08/15/2011   Lab Results  Component Value Date   CHOLHDL 4.2 03/11/2012   CHOLHDL 3 10/11/2011   CHOLHDL 4.0 08/15/2011     Basename 03/19/12 0533  INR 2.84*    Disposition:  The patient is being discharged to SNF in stable condition.  Follow-up:  Follow up with [facility] Schedule an appointment as soon as possible for a visit in 4 weeks.   Contact information:   [address] [place] [address] [phone]    Follow up with  PARRETT,TAMMY, NP. Schedule an appointment as soon as possible for a visit in 1 week.   Contact information:   [facility] [address] [place] [address] [phone]   Discharge Medications:  Medication List  As of 03/19/2012  9:57 AM   STOP taking these medications     budesonide 0.5 MG/2ML nebulizer solution (changed dose in hospital - see new dose below)      diltiazem 240 MG 24 hr capsule (changed dose in hospital - see new dose below)      levofloxacin 500 MG tablet      omeprazole 20 MG capsule       TAKE these medications      bisoprolol 10 MG tablet   Commonly known as: ZEBETA   Take 1 tablet (10 mg total) by mouth daily.      budesonide 0.25 MG/2ML nebulizer solution   Commonly known as: PULMICORT   Take 2 mLs (0.25 mg total) by nebulization 4 (four) times daily.      calcium carbonate 500 MG chewable tablet   Commonly known as: TUMS - dosed in mg elemental calcium   Chew 2 tablets by mouth daily as needed. For indigestion/heartburn      cholecalciferol 1000 UNITS tablet   Commonly known as: VITAMIN D   Take 2,000 Units by mouth daily.      clopidogrel 75 MG tablet   Commonly known as: PLAVIX   Take 1 tablet (75 mg total) by mouth daily with breakfast.      digoxin 0.25 MG tablet   Commonly known as: LANOXIN   Take 250 mcg by mouth daily.      diltiazem 360 MG 24 hr capsule   Commonly known as: CARDIZEM CD   Take 1 capsule (360 mg total) by mouth daily.      feeding supplement Liqd   Take 237 mLs by mouth 2 (two) times daily between meals.      furosemide 20 MG tablet   Commonly known as: LASIX   Take 3 tablets (60 mg total) by mouth daily.      isosorbide mononitrate 60 MG 24 hr tablet   Commonly known as: IMDUR   Take 60 mg by mouth daily.      levalbuterol 0.63 MG/3ML nebulizer solution   Commonly known as: XOPENEX   Take 3 mLs (0.63 mg total) by nebulization every 3 (three) hours as needed for wheezing or shortness of breath.      levalbuterol 45 MCG/ACT inhaler   Commonly known as: XOPENEX HFA   Inhale 1-2 puffs into the lungs every 4 (four) hours as needed for wheezing.      morphine 15 MG 12 hr tablet   Commonly known as: MS CONTIN   Take 1 tablet (15 mg total) by mouth daily.      morphine 15 MG tablet   Commonly known as: MSIR   Take 0.5 tablets (7.5 mg total) by mouth every 6 (six) hours as needed (or DYSPNEA).      moxifloxacin 400 MG tablet   Commonly known as: AVELOX   Take 1 tablet (400 mg total) by mouth daily.      nitroGLYCERIN 0.4 MG SL tablet   Commonly known  as: NITROSTAT   Place 0.4 mg under the tongue every 5 (five) minutes as needed. For chest pain      oxyCODONE 5 MG immediate release tablet   Commonly known as: Oxy IR/ROXICODONE   Take 1 tablet (  5 mg total) by mouth every 4 (four) hours as needed (dyspnea).      pantoprazole 40 MG tablet   Commonly known as: PROTONIX   Take 1 tablet (40 mg total) by mouth daily at 12 noon.      Potassium Gluconate 550 MG Tabs   Take 1 tablet by mouth daily.      predniSONE 20 MG tablet   Commonly known as: DELTASONE   Take 2 tablets (40 mg total) by mouth daily.      rosuvastatin 20 MG tablet   Commonly known as: CRESTOR   Take 20 mg by mouth daily.      sennosides-docusate sodium 8.6-50 MG tablet   Commonly known as: SENOKOT-S   Take 1 tablet by mouth at bedtime.      tamsulosin HCl 0.4 MG Caps   Commonly known as: FLOMAX   Take 0.4 mg by mouth daily.      warfarin 2.5 MG tablet   Commonly known as: COUMADIN   Take 1 tablet (2.5 mg total) by mouth one time only at 6 PM.      Duration of Discharge Encounter: Greater than 30 minutes including physician time.  Signed, Rick Duff, PA-C 03/19/2012, 9:57 AM

## 2012-03-19 NOTE — ED Provider Notes (Signed)
I saw and evaluated the patient, reviewed the resident's note and I agree with the findings and plan.  83yM with cough and dyspnea. Hx of afib and with RVR in ED. Lungs with some mild wheezing. Mild tachypnea. Heart tachy and irreg irreg. abd benign. Diltiazem gtt with good control. Consider infectious, PE or ACS but doubt at this time. Cardiology consulted for further eval.  Raeford Razor, MD 03/19/12 [phone]

## 2012-03-19 NOTE — Discharge Instructions (Signed)
He will need daily INRs for Coumadin management. Per pulmonary, he will continue prednisone 40 mg once daily until seen in follow-up (will need to schedule visit in 1-2 weeks with Rubye Oaks, NP). He will continue Avelox (x 7 days), morphine and bronchodilators/nebs (pulmonary has provided prescriptions). He will continue digoxin, diltiazem and bisoprolol. All other medications as per discharge list/summary.

## 2012-03-19 NOTE — Progress Notes (Signed)
Report called to [facility] due to pt d/c to [facility].  Verbalized understanding of report.  Amanda Pea, Charity fundraiser.

## 2012-03-19 NOTE — Progress Notes (Signed)
Patient ID: [name], male   DOB: 1927/11/17, 78 y.o.   MRN: [id number] Subjective:  Dyspnea at baseline. No chest pain.  Objective:  Vital Signs in the last 24 hours: Temp:  [97.4 F (36.3 C)-97.8 F (36.6 C)] 97.4 F (36.3 C) (07/04 0419) Pulse Rate:  [80-97] 97  (07/04 0419) Resp:  [18-22] 18  (07/04 0419) BP: (103-110)/(69-70) 103/69 mmHg (07/04 0419) SpO2:  [95 %-98 %] 98 % (07/04 0419) Weight:  [152 lb 14.4 oz (69.355 kg)] 152 lb 14.4 oz (69.355 kg) (07/04 0419)  Intake/Output from previous day: 07/03 0701 - 07/04 0700 In: 1180 [P.O.:1180] Out: 1950 [Urine:1950] Intake/Output from this shift:    Physical Exam: Chronically ill appearing NAD HEENT: Unremarkable Neck:  No JVD, no thyromegally Lungs:  Clear with reduced breath sounds throughout. HEART:  Regular rate rhythm, no murmurs, no rubs, no clicks Abd:  Flat, positive bowel sounds, no organomegally, no rebound, no guarding Ext:  2 plus pulses, no edema, no cyanosis, no clubbing Skin:  No rashes no nodules Neuro:  CN II through XII intact, motor grossly intact  Lab Results:  Basename 03/19/12 0533  WBC 18.8*  HGB 15.8  PLT 164    Basename 03/18/12 0650  NA 134*  K 4.4  CL 87*  CO2 41*  GLUCOSE 184*  BUN 33*  CREATININE 0.80   No results found for this basename: TROPONINI:2,CK,MB:2 in the last 72 hours Hepatic Function Panel No results found for this basename: PROT,ALBUMIN,AST,ALT,ALKPHOS,BILITOT,BILIDIR,IBILI in the last 72 hours No results found for this basename: CHOL in the last 72 hours No results found for this basename: PROTIME in the last 72 hours  Imaging: No results found.  Cardiac Studies: Tele - atrial fib with a CVR and occaisional Ventricular pacing Assessment/Plan:  1. Atrial fib with an RVR, now fairly well controlled.  2. Endstage COPD with exacerbation, now improved. He has a persistent elevation of WBC due to steroid therapy.  3. Chest pain - resolved with patient not a  candidate for invasive evaluation. Rec: he has acceptance to SNF with rehab. He will be transferred today with followup with Dr. Patty Sermons. Would discharge on 7 additional days of Avelox and current cardiac and pulmonary meds. Would continue on 40 mg of prednisone until seen by pulmonary/Dr. Patty Sermons.  LOS: 9 days    Buel Ream.D. 03/19/2012, 8:43 AM

## 2012-03-19 NOTE — Progress Notes (Signed)
Name: [name] MRN: [id number] DOB: 23-Dec-1927    LOS: 9  Referring Provider:  Dr. Patty [name] Reason for Referral:  COPD  PULMONARY / CRITICAL CARE MEDICINE  HPI:  79 year old male that is well known to our practice for Gold stage 3-4 COPD who was suffering from a recent exacerbation.  Was seen in office on 6/20 and given a prednisone taper then followed up on 6/24 where he was noted to have purulent sputum.  Levaquin was called for the patient.  The patient reports to the ED on 6/25 with chest pain and dyspnea.  In the ED the patient was noted new onset a-fib.  PCCM was consulted for COPD exacerbation management.   Current Status:    Vital Signs: Temp:  [97.4 F (36.3 C)-97.8 F (36.6 C)] 97.4 F (36.3 C) (07/04 0419) Pulse Rate:  [80-97] 97  (07/04 0419) Resp:  [18-22] 18  (07/04 0419) BP: (103-110)/(69-70) 103/69 mmHg (07/04 0419) SpO2:  [95 %-98 %] 98 % (07/04 0419) Weight:  [152 lb 14.4 oz (69.355 kg)] 152 lb 14.4 oz (69.355 kg) (07/04 0419)  Physical Examination: General:  Chronically ill appearing elderly male, depressed affect, wob improved Psych: flat affect Neuro:  Alert and oriented, moving all ext to command. HEENT:  [place]/AT, PERRL, EOM-I and DMM. Neck:  Supple, -LAN and -thyromegally. Cardiovascular:  irreg, Nl S1/S2, -M/R/G. Lungs:  Distant BS, diffuse end exp wheezes decreased Abdomen:  Soft, NT, ND and +BS. Musculoskeletal:  -edema and -tenderness, - clubbing. Skin:  Thin but intact.   Principal Problem:  *Chest pain Active Problems:  CAD (coronary artery disease)  HLD (hyperlipidemia)  COPD exacerbation  Acute on chronic combined systolic and diastolic heart failure  Hypoxemia  Pulmonary edema  Type 2 diabetes mellitus  Permanent atrial fibrillation  Depression   ASSESSMENT AND PLAN  PULMONARY   CXR:  Cardiomegally and COPD, mild pulmonary edema.  A:  78 year old male with severe COPD presenting with COPD exacerbation and a-fib with RVR.  S/p levaquin RX P:   -Pred 40 daily with slow taper over 2-3 weeks , once improved.  Would leave on 40mg  until seen by Pulmonary.  Will need to see Darin Oaks, NP in 1-2 weeks (appt will need to be scheduled) -PRN xopenex. -Nebulized budesonide. -Change to SR morphine 15 mg & use MSIR 7.5mg  PO q 6h prn  for dyspnea & titrate to effect -DNR noted -will do roflumilas as opd -avelox 7 days  CARDIOVASCULAR No results found for this basename: TROPONINI:5,LATICACIDVEN:5, O2SATVEN:5,PROBNP:5 in the last 168 hours ECG:  A-fib. Lines: PIV.  A: CHF and a-fib history. P:  Per cards, increased cardizem, ok with bisoprolol  HEMATOLOGIC  Lab 03/19/12 0533 03/18/12 0650 03/17/12 0628 03/16/12 0500 03/15/12 0535 03/13/12 0535  HGB 15.8 -- -- 15.6 -- 15.1  HCT 46.1 -- -- 48.0 -- 44.7  PLT 164 -- -- 172 -- 184  INR 2.84* 3.27* 2.58* 2.19* 3.31* --  APTT -- -- -- -- -- --   A:  INR / dosing per PHARM P:  Coumadin per pharmacy.  INFECTIOUS  Lab 03/19/12 0533 03/16/12 0500 03/13/12 0535  WBC 18.8* 10.6* 15.0*  PROCALCITON -- -- --   Cultures: Blood 6/25>>>ng Urine 6/25>>>ng Sputum 6/25>>>oral flora  Antibiotics: Levaquin 6/24>>>  A:  Purulent bronchitis.s/p levaquin P:  Monitor   ENDOCRINE No results found for this basename: GLUCAP:5 in the last 168 hours A:  No diabetes history.    P:  Follow sugars while on steroids.   NEURO A:  He appears depressed as he did in 2013/03/19when he yearned for his dead wife but refused pscyh  Consult and medication  P:  may need Rx but likely related to his prolonged illness  SNF dc being planned, he refused hospice   Darin Brim, NP-C [facility] Pgr: [phone] or [phone]  I have seen and examined this patient with the nurse practionner and agree with the above assessment and plan  [name] Beeper  [phone]  Cell  [phone]  If no response or cell goes to voicemail, call beeper  [phone]    03/19/2012 9:28 AM

## 2012-03-19 NOTE — Progress Notes (Signed)
Pt had frequent pvc's and asymptomatic..  VSS.  R. Barrett, PA.  Darin Lundberg,RN

## 2012-03-19 NOTE — Progress Notes (Signed)
Pt d/c to [facility] via a ambulance.  All d/c instructions in packet and taken by ambulance transport.  Amanda Pea, RN>

## 2012-03-19 NOTE — Clinical Social Work Note (Signed)
Mr. Dahlen is medically stable for discharge to [facility] today. Discharge information forwarded to facility. CSW facilitating transport to SNF via ambulance.  Genelle Bal, MSW, LCSW [phone]

## 2012-03-19 NOTE — Progress Notes (Signed)
CM received referral for [facility] needs, however pt will d/c to SNF, CSW following and assisting with d/c, no CM needs identified.  Johny Shock RN MPH Case Manager

## 2012-03-19 NOTE — Progress Notes (Signed)
ANTICOAGULATION CONSULT NOTE - Follow Up Consult  Pharmacy Consult for Coumadin Indication: atrial fibrillation  No Known Allergies  Patient Measurements: Height: 5\' 9"  (175.3 cm) Weight: 152 lb 14.4 oz (69.355 kg) (bed) IBW/kg (Calculated) : 70.7   Vital Signs: Temp: 97.4 F (36.3 C) (07/04 0419) Temp src: Oral (07/04 0419) BP: 103/69 mmHg (07/04 0419) Pulse Rate: 97  (07/04 0419)  Labs:  Basename 03/19/12 0533 03/18/12 0650 03/17/12 0628  HGB 15.8 -- --  HCT 46.1 -- --  PLT 164 -- --  APTT -- -- --  LABPROT 30.3* 33.8* 28.1*  INR 2.84* 3.27* 2.58*  HEPARINUNFRC -- -- --  CREATININE -- 0.80 --  CKTOTAL -- -- --  CKMB -- -- --  TROPONINI -- -- --    Estimated Creatinine Clearance: 71.1 ml/min (by C-G formula based on Cr of 0.8).  Assessment: 78 y.o. M resumed on warfarin from PTA for hx Afib with a SUPRAtherapeutic INR. PTA dose was noted to be 5 mg daily EXCEPT for 2.5 mg on Mon/Fri. INR = 2.84 today. Had some CP yesterday. Home dose is probably to much for him currently especially when he is on chronic steroids.  Goal of Therapy:  INR 2-3   Plan:  1. Coumadin 2.5mg  PO x1 today (if dc to SNF, can try coumadin 2.5mg  qday except 5mg  wed/sund) 2. Daily PT/INR

## 2012-04-04 ENCOUNTER — Emergency Department (HOSPITAL_COMMUNITY): Payer: Medicare Other

## 2012-04-04 ENCOUNTER — Inpatient Hospital Stay (HOSPITAL_COMMUNITY)
Admission: EM | Admit: 2012-04-04 | Discharge: 2012-04-14 | DRG: 291 | Disposition: A | Discharge status: Skilled Nursing Facility | Payer: Medicare Other | Attending: Internal Medicine | Admitting: Internal Medicine

## 2012-04-04 DIAGNOSIS — J449 Chronic obstructive pulmonary disease, unspecified: Secondary | ICD-10-CM

## 2012-04-04 DIAGNOSIS — I4821 Permanent atrial fibrillation: Secondary | ICD-10-CM | POA: Diagnosis present

## 2012-04-04 DIAGNOSIS — I252 Old myocardial infarction: Secondary | ICD-10-CM

## 2012-04-04 DIAGNOSIS — I509 Heart failure, unspecified: Secondary | ICD-10-CM | POA: Diagnosis present

## 2012-04-04 DIAGNOSIS — R0600 Dyspnea, unspecified: Secondary | ICD-10-CM

## 2012-04-04 DIAGNOSIS — B9620 Unspecified Escherichia coli [E. coli] as the cause of diseases classified elsewhere: Secondary | ICD-10-CM | POA: Diagnosis present

## 2012-04-04 DIAGNOSIS — Z9861 Coronary angioplasty status: Secondary | ICD-10-CM | POA: Diagnosis present

## 2012-04-04 DIAGNOSIS — I2510 Atherosclerotic heart disease of native coronary artery without angina pectoris: Secondary | ICD-10-CM | POA: Diagnosis present

## 2012-04-04 DIAGNOSIS — I4891 Unspecified atrial fibrillation: Secondary | ICD-10-CM

## 2012-04-04 DIAGNOSIS — J9690 Respiratory failure, unspecified, unspecified whether with hypoxia or hypercapnia: Secondary | ICD-10-CM

## 2012-04-04 DIAGNOSIS — J441 Chronic obstructive pulmonary disease with (acute) exacerbation: Secondary | ICD-10-CM | POA: Diagnosis present

## 2012-04-04 DIAGNOSIS — Z950 Presence of cardiac pacemaker: Secondary | ICD-10-CM

## 2012-04-04 DIAGNOSIS — J9620 Acute and chronic respiratory failure, unspecified whether with hypoxia or hypercapnia: Secondary | ICD-10-CM | POA: Diagnosis present

## 2012-04-04 DIAGNOSIS — E119 Type 2 diabetes mellitus without complications: Secondary | ICD-10-CM | POA: Diagnosis present

## 2012-04-04 DIAGNOSIS — R29898 Other symptoms and signs involving the musculoskeletal system: Secondary | ICD-10-CM | POA: Diagnosis present

## 2012-04-04 DIAGNOSIS — E871 Hypo-osmolality and hyponatremia: Secondary | ICD-10-CM | POA: Diagnosis not present

## 2012-04-04 DIAGNOSIS — I5043 Acute on chronic combined systolic (congestive) and diastolic (congestive) heart failure: Principal | ICD-10-CM | POA: Diagnosis present

## 2012-04-04 DIAGNOSIS — N390 Urinary tract infection, site not specified: Secondary | ICD-10-CM | POA: Diagnosis present

## 2012-04-04 DIAGNOSIS — A498 Other bacterial infections of unspecified site: Secondary | ICD-10-CM | POA: Diagnosis present

## 2012-04-04 DIAGNOSIS — Z7901 Long term (current) use of anticoagulants: Secondary | ICD-10-CM

## 2012-04-04 LAB — HEPATIC FUNCTION PANEL
AST: 13 U/L (ref 0–37)
Albumin: 3.3 g/dL — ABNORMAL LOW (ref 3.5–5.2)
Bilirubin, Direct: 0.2 mg/dL (ref 0.0–0.3)
Total Protein: 6.7 g/dL (ref 6.0–8.3)

## 2012-04-04 LAB — CBC WITH DIFFERENTIAL/PLATELET
Hemoglobin: 15.4 g/dL (ref 13.0–17.0)
Lymphocytes Relative: 4 % — ABNORMAL LOW (ref 12–46)
Lymphs Abs: 0.3 10*3/uL — ABNORMAL LOW (ref 0.7–4.0)
Monocytes Relative: 10 % (ref 3–12)
Neutrophils Relative %: 86 % — ABNORMAL HIGH (ref 43–77)
Platelets: 186 10*3/uL (ref 150–400)
RBC: 5.05 MIL/uL (ref 4.22–5.81)
WBC: 7 10*3/uL (ref 4.0–10.5)

## 2012-04-04 LAB — BASIC METABOLIC PANEL
CO2: 34 mEq/L — ABNORMAL HIGH (ref 19–32)
Calcium: 9.4 mg/dL (ref 8.4–10.5)
GFR calc non Af Amer: 87 mL/min — ABNORMAL LOW (ref >90)
Glucose, Bld: 399 mg/dL — ABNORMAL HIGH (ref 70–99)
Potassium: 3.6 mEq/L (ref 3.5–5.1)
Sodium: 132 mEq/L — ABNORMAL LOW (ref 135–145)

## 2012-04-04 LAB — PROTIME-INR
INR: 2.59 — ABNORMAL HIGH (ref 0.00–1.49)
Prothrombin Time: 28.2 s — ABNORMAL HIGH (ref 11.6–15.2)

## 2012-04-04 LAB — URINALYSIS, ROUTINE W REFLEX MICROSCOPIC
Bilirubin Urine: NEGATIVE
Nitrite: NEGATIVE
Protein, ur: 100 mg/dL — AB
Specific Gravity, Urine: 1.029 (ref 1.005–1.030)
Urobilinogen, UA: 0.2 mg/dL (ref 0.0–1.0)

## 2012-04-04 LAB — BLOOD GAS, ARTERIAL
Bicarbonate: 34.8 mEq/L — ABNORMAL HIGH (ref 20.0–24.0)
Expiratory PAP: 5
pCO2 arterial: 58 mmHg (ref 35.0–45.0)
pH, Arterial: 7.396 (ref 7.350–7.450)
pO2, Arterial: 73.2 mmHg — ABNORMAL LOW (ref 80.0–100.0)

## 2012-04-04 LAB — LACTIC ACID, PLASMA: Lactic Acid, Venous: 4.3 mmol/L — ABNORMAL HIGH (ref 0.5–2.2)

## 2012-04-04 LAB — TROPONIN I: Troponin I: <0.3 ng/mL

## 2012-04-04 LAB — MRSA PCR SCREENING: MRSA by PCR: POSITIVE — AB

## 2012-04-04 LAB — GLUCOSE, CAPILLARY: Glucose-Capillary: 470 mg/dL — ABNORMAL HIGH (ref 70–99)

## 2012-04-04 LAB — DIGOXIN LEVEL: Digoxin Level: 1.2 ng/mL (ref 0.8–2.0)

## 2012-04-04 LAB — URINE MICROSCOPIC-ADD ON

## 2012-04-04 LAB — MAGNESIUM: Magnesium: 2 mg/dL (ref 1.5–2.5)

## 2012-04-04 LAB — APTT: aPTT: 39 s — ABNORMAL HIGH (ref 24–37)

## 2012-04-04 MED ORDER — METHYLPREDNISOLONE SODIUM SUCC 125 MG IJ SOLR
INTRAMUSCULAR | Status: AC
  Filled 2012-04-04: qty 2

## 2012-04-04 MED ORDER — BISOPROLOL FUMARATE 10 MG PO TABS
10.0000 mg | ORAL_TABLET | Freq: Every day | ORAL | Status: DC
  Filled 2012-04-04: qty 1

## 2012-04-04 MED ORDER — DIGOXIN 250 MCG PO TABS
0.2500 mg | ORAL_TABLET | Freq: Every day | ORAL | Status: DC
  Administered 2012-04-05 – 2012-04-07 (×3): 0.25 mg via ORAL
  Filled 2012-04-04 (×5): qty 1

## 2012-04-04 MED ORDER — BIOTENE DRY MOUTH MT LIQD
15.0000 mL | Freq: Two times a day (BID) | OROMUCOSAL | Status: DC
  Administered 2012-04-04 – 2012-04-13 (×17): 15 mL via OROMUCOSAL

## 2012-04-04 MED ORDER — NITROGLYCERIN 0.4 MG/HR TD PT24
0.4000 mg | MEDICATED_PATCH | Freq: Every day | TRANSDERMAL | Status: DC
  Administered 2012-04-04 – 2012-04-05 (×2): 0.4 mg via TRANSDERMAL
  Filled 2012-04-04 (×2): qty 1

## 2012-04-04 MED ORDER — METHYLPREDNISOLONE SODIUM SUCC 125 MG IJ SOLR
125.0000 mg | Freq: Four times a day (QID) | INTRAMUSCULAR | Status: DC
  Administered 2012-04-04 – 2012-04-06 (×8): 125 mg via INTRAVENOUS
  Filled 2012-04-04 (×12): qty 2

## 2012-04-04 MED ORDER — CLOPIDOGREL BISULFATE 75 MG PO TABS
75.0000 mg | ORAL_TABLET | Freq: Every day | ORAL | Status: DC
  Administered 2012-04-05 – 2012-04-14 (×10): 75 mg via ORAL
  Filled 2012-04-04 (×11): qty 1

## 2012-04-04 MED ORDER — IPRATROPIUM BROMIDE 0.02 % IN SOLN
RESPIRATORY_TRACT | Status: AC
  Filled 2012-04-04: qty 2.5

## 2012-04-04 MED ORDER — ONDANSETRON HCL 4 MG/2ML IJ SOLN: 4.0000 mg | Freq: Four times a day (QID) | INTRAMUSCULAR | Status: DC | PRN

## 2012-04-04 MED ORDER — MAGNESIUM CITRATE PO SOLN
1.0000 | Freq: Once | ORAL | Status: AC | PRN
  Filled 2012-04-04: qty 296

## 2012-04-04 MED ORDER — WARFARIN SODIUM 2.5 MG PO TABS: 2.5000 mg | ORAL_TABLET | Freq: Once | ORAL | Status: DC

## 2012-04-04 MED ORDER — ZOLPIDEM TARTRATE 5 MG PO TABS: 5.0000 mg | ORAL_TABLET | Freq: Every evening | ORAL | Status: DC | PRN

## 2012-04-04 MED ORDER — VANCOMYCIN HCL IN DEXTROSE 1-5 GM/200ML-% IV SOLN
1000.0000 mg | Freq: Once | INTRAVENOUS | Status: AC
  Administered 2012-04-04: 1000 mg via INTRAVENOUS
  Filled 2012-04-04: qty 200

## 2012-04-04 MED ORDER — MORPHINE SULFATE 4 MG/ML IJ SOLN
4.0000 mg | INTRAMUSCULAR | Status: DC | PRN
  Administered 2012-04-12 – 2012-04-14 (×3): 4 mg via INTRAVENOUS
  Filled 2012-04-04 (×5): qty 1

## 2012-04-04 MED ORDER — DILTIAZEM HCL 100 MG IV SOLR
5.0000 mg/h | INTRAVENOUS | Status: DC
  Administered 2012-04-04 – 2012-04-05 (×2): 10 mg/h via INTRAVENOUS
  Administered 2012-04-05 (×2): 15 mg/h via INTRAVENOUS
  Administered 2012-04-06 (×2): 20 mg/h via INTRAVENOUS
  Administered 2012-04-06: 15 mg/h via INTRAVENOUS
  Administered 2012-04-06 – 2012-04-08 (×6): 20 mg/h via INTRAVENOUS
  Filled 2012-04-04 (×2): qty 100

## 2012-04-04 MED ORDER — SENNOSIDES-DOCUSATE SODIUM 8.6-50 MG PO TABS
1.0000 | ORAL_TABLET | Freq: Every day | ORAL | Status: DC
  Administered 2012-04-05 – 2012-04-13 (×8): 1 via ORAL
  Filled 2012-04-04 (×11): qty 1

## 2012-04-04 MED ORDER — FUROSEMIDE 40 MG PO TABS
60.0000 mg | ORAL_TABLET | Freq: Every day | ORAL | Status: DC
  Filled 2012-04-04: qty 1

## 2012-04-04 MED ORDER — ACETAMINOPHEN 650 MG RE SUPP: 650.0000 mg | Freq: Four times a day (QID) | RECTAL | Status: DC | PRN

## 2012-04-04 MED ORDER — OXYCODONE HCL 5 MG PO TABS: 5.0000 mg | ORAL_TABLET | ORAL | Status: DC | PRN

## 2012-04-04 MED ORDER — WARFARIN - PHYSICIAN DOSING INPATIENT: Freq: Every day | Status: DC

## 2012-04-04 MED ORDER — WARFARIN SODIUM 2.5 MG PO TABS
2.5000 mg | ORAL_TABLET | Freq: Once | ORAL | Status: AC
  Filled 2012-04-04: qty 1

## 2012-04-04 MED ORDER — INSULIN ASPART 100 UNIT/ML ~~LOC~~ SOLN
0.0000 [IU] | Freq: Three times a day (TID) | SUBCUTANEOUS | Status: DC
  Administered 2012-04-04: 15 [IU] via SUBCUTANEOUS
  Administered 2012-04-04: 11 [IU] via SUBCUTANEOUS
  Administered 2012-04-05 (×2): 5 [IU] via SUBCUTANEOUS
  Administered 2012-04-06: 8 [IU] via SUBCUTANEOUS
  Administered 2012-04-07: 11 [IU] via SUBCUTANEOUS
  Administered 2012-04-07: 15 [IU] via SUBCUTANEOUS
  Administered 2012-04-07: 5 [IU] via SUBCUTANEOUS
  Administered 2012-04-08: 8 [IU] via SUBCUTANEOUS
  Administered 2012-04-08: 11 [IU] via SUBCUTANEOUS
  Administered 2012-04-08: 8 [IU] via SUBCUTANEOUS
  Administered 2012-04-09 (×2): 3 [IU] via SUBCUTANEOUS
  Administered 2012-04-10: 15 [IU] via SUBCUTANEOUS
  Administered 2012-04-10: 11 [IU] via SUBCUTANEOUS
  Administered 2012-04-11: 8 [IU] via SUBCUTANEOUS
  Administered 2012-04-11: 3 [IU] via SUBCUTANEOUS
  Administered 2012-04-12: 11 [IU] via SUBCUTANEOUS
  Administered 2012-04-12: 4 [IU] via SUBCUTANEOUS
  Administered 2012-04-13: 11 [IU] via SUBCUTANEOUS
  Administered 2012-04-13 – 2012-04-14 (×2): 2 [IU] via SUBCUTANEOUS

## 2012-04-04 MED ORDER — ISOSORBIDE MONONITRATE ER 60 MG PO TB24
60.0000 mg | ORAL_TABLET | Freq: Every day | ORAL | Status: DC
  Filled 2012-04-04: qty 1

## 2012-04-04 MED ORDER — ALBUTEROL SULFATE (5 MG/ML) 0.5% IN NEBU
INHALATION_SOLUTION | RESPIRATORY_TRACT | Status: AC
  Filled 2012-04-04: qty 0.5

## 2012-04-04 MED ORDER — ATORVASTATIN CALCIUM 10 MG PO TABS
10.0000 mg | ORAL_TABLET | Freq: Every day | ORAL | Status: DC
  Administered 2012-04-06 – 2012-04-13 (×8): 10 mg via ORAL
  Filled 2012-04-04 (×11): qty 1

## 2012-04-04 MED ORDER — DILTIAZEM HCL ER COATED BEADS 360 MG PO CP24
360.0000 mg | ORAL_CAPSULE | Freq: Every day | ORAL | Status: DC
  Filled 2012-04-04: qty 1

## 2012-04-04 MED ORDER — SODIUM CHLORIDE 0.9 % IJ SOLN
3.0000 mL | Freq: Two times a day (BID) | INTRAMUSCULAR | Status: DC
  Administered 2012-04-05: 3 mL via INTRAVENOUS
  Administered 2012-04-05: 09:00:00 via INTRAVENOUS
  Administered 2012-04-05 – 2012-04-14 (×10): 3 mL via INTRAVENOUS

## 2012-04-04 MED ORDER — NITROGLYCERIN 0.4 MG SL SUBL: 0.4000 mg | SUBLINGUAL_TABLET | SUBLINGUAL | Status: DC | PRN

## 2012-04-04 MED ORDER — FUROSEMIDE 10 MG/ML IJ SOLN
40.0000 mg | Freq: Every day | INTRAMUSCULAR | Status: DC
  Administered 2012-04-04 – 2012-04-07 (×4): 40 mg via INTRAVENOUS
  Filled 2012-04-04 (×5): qty 4

## 2012-04-04 MED ORDER — ENSURE COMPLETE PO LIQD
237.0000 mL | Freq: Two times a day (BID) | ORAL | Status: DC
  Administered 2012-04-05 – 2012-04-06 (×2): 237 mL via ORAL
  Filled 2012-04-04: qty 237

## 2012-04-04 MED ORDER — MORPHINE SULFATE ER 15 MG PO TBCR
15.0000 mg | EXTENDED_RELEASE_TABLET | Freq: Every day | ORAL | Status: DC
  Administered 2012-04-07: 15 mg via ORAL
  Filled 2012-04-04 (×3): qty 1

## 2012-04-04 MED ORDER — CEFEPIME HCL 1 G IJ SOLR
1.0000 g | Freq: Two times a day (BID) | INTRAMUSCULAR | Status: DC
  Administered 2012-04-04 – 2012-04-06 (×6): 1 g via INTRAVENOUS
  Filled 2012-04-04 (×8): qty 1

## 2012-04-04 MED ORDER — LEVALBUTEROL HCL 0.63 MG/3ML IN NEBU
0.6300 mg | INHALATION_SOLUTION | RESPIRATORY_TRACT | Status: DC | PRN
  Filled 2012-04-04: qty 3

## 2012-04-04 MED ORDER — ONDANSETRON HCL 4 MG PO TABS: 4.0000 mg | ORAL_TABLET | Freq: Four times a day (QID) | ORAL | Status: DC | PRN

## 2012-04-04 MED ORDER — POTASSIUM CHLORIDE IN NACL 20-0.9 MEQ/L-% IV SOLN
INTRAVENOUS | Status: DC
  Administered 2012-04-04: 10 mL via INTRAVENOUS
  Administered 2012-04-10: 22:00:00 via INTRAVENOUS
  Filled 2012-04-04 (×6): qty 1000

## 2012-04-04 MED ORDER — PANTOPRAZOLE SODIUM 40 MG PO TBEC: 40.0000 mg | DELAYED_RELEASE_TABLET | Freq: Every day | ORAL | Status: DC

## 2012-04-04 MED ORDER — SODIUM CHLORIDE 0.9 % IV BOLUS (SEPSIS)
500.0000 mL | Freq: Once | INTRAVENOUS | Status: AC
  Administered 2012-04-04: 1000 mL via INTRAVENOUS

## 2012-04-04 MED ORDER — ALUM & MAG HYDROXIDE-SIMETH 200-200-20 MG/5ML PO SUSP: 30.0000 mL | Freq: Four times a day (QID) | ORAL | Status: DC | PRN

## 2012-04-04 MED ORDER — CHLORHEXIDINE GLUCONATE 0.12 % MT SOLN
15.0000 mL | Freq: Two times a day (BID) | OROMUCOSAL | Status: DC
  Administered 2012-04-05 – 2012-04-14 (×20): 15 mL via OROMUCOSAL
  Filled 2012-04-04 (×20): qty 15
  Filled 2012-04-04: qty 30
  Filled 2012-04-04: qty 15

## 2012-04-04 MED ORDER — POTASSIUM GLUCONATE 595 (99 K) MG PO TABS
595.0000 mg | ORAL_TABLET | Freq: Three times a day (TID) | ORAL | Status: DC
  Administered 2012-04-05 – 2012-04-12 (×24): 595 mg via ORAL
  Filled 2012-04-04 (×30): qty 1

## 2012-04-04 MED ORDER — ACETAMINOPHEN 325 MG PO TABS: 650.0000 mg | ORAL_TABLET | Freq: Four times a day (QID) | ORAL | Status: DC | PRN

## 2012-04-04 MED ORDER — METOPROLOL TARTRATE 1 MG/ML IV SOLN
2.5000 mg | Freq: Two times a day (BID) | INTRAVENOUS | Status: DC
  Administered 2012-04-05: 2.5 mg via INTRAVENOUS
  Filled 2012-04-04 (×3): qty 5

## 2012-04-04 NOTE — Progress Notes (Signed)
Pt taken off BIPAP and placed on NRB.  Pt tolerating well at this time, RT to monitor and wean O2 as tolerated.

## 2012-04-04 NOTE — H&P (Addendum)
[name] is an 78 y.o. male.   Chief Complaint: can't breathe HPI:  The patient is an 78 year old man who is a resident of a skilled nursing facility since earlier this month after a hospital admission because of chest pain and dyspnea .He had been admitted to the hospital on June 25 and discharged on March 19, 2012. On admission he had atrial fibrillation with a rapid ventricular rate and was started on IV Cardizem.  His heart rate improved and chest discomfort resolved.  Serial cardiac enzymes were negative.  Medical treatment with bisoprolol and Cardizem was adjusted. His condition improved and he was discharged to a skilled nursing facility for rehabilitation. His medical history is most significant for ischemic cardiomyopathy with left ventricular ejection fraction of 35% with chronic systolic congestive heart failure, coronary artery disease, dyslipidemia, diabetes mellitus, type II, atherosclerotic peripheral vascular disease, end-stage COPD for which he has a do not resuscitate and do not intubate order, and atrial fibrillation.  At the skilled nursing facility he began to have increasing dyspnea yesterday and medications were adjusted.  However, his dyspnea gradually worsened resulting in urgent trip to the emergency room for evaluation.  In the emergency room he had severe dyspnea and was started on BiPAP support.  His dyspnea has improved with this.  He is able to answer questions by nodding his head.  Lately he has not been trouble by fever or chills or productive cough.  He has had some mild constipation but has not had nausea, vomiting, or painful urination.  He would like to continue his do not resuscitate order.  Past Medical History  Diagnosis Date  . Pneumonia 12/06/10    healthcare-associated/ left lower lobe  . COPD (chronic obstructive pulmonary disease)     severe stage IV  . Atrial fibrillation     on Coumadin with St. Jude single-chamber pacemaker  . Diabetes mellitus    type 2; s/p Prednisone therapy for pneumonia 12/2010  . Coronary artery disease     s/p anterior STEMI 09/2009; cath. revealed mid LAD 40%, distal LAD 100%- PTCA, prox. RCA 40%, mid. RCA 100% with good collateral filling of PDA; EF 25%; NSTEMI 07/2011 - PCI/DES 100% LCX  . ST elevation (STEMI) myocardial infarction 01//11/12    anterior  . CHF (congestive heart failure)     EF 25% on 09/26/10; EF 50-55% and grade 1 diastolic dysfunction on ECHO 08/2010   . Hypertension   . Hyperlipidemia   . AAA (abdominal aortic aneurysm)     3 cm infrarenal abdominal aortic aneurysm per aorta ultrasound 03/2010  . Osteoarthritis     s/p bilat hip arthroplasty  . Angina   . Ischemic cardiomyopathy     EF 35% LHC 11/12  . GERD (gastroesophageal reflux disease)   . Hypercholesteremia   . PVD (peripheral vascular disease)   . Anxiety     Medications Prior to Admission  Medication Sig Dispense Refill  . guaiFENesin (MUCINEX) 600 MG 12 hr tablet Take 600 mg by mouth 2 (two) times daily.      Marland Kitchen neomycin-bacitracin-polymyxin (NEOSPORIN) ointment Apply topically See admin instructions. CLEANSE LEFT FOREARM SKIN TEAR WITH SALINE AND APPLY OINTMENT COVER WITH GAUZE AND TEGADERM EVERY 3 DAYS UNTIL HEALED      . bisoprolol (ZEBETA) 10 MG tablet Take 1 tablet (10 mg total) by mouth daily.  30 tablet  3  . budesonide (PULMICORT) 0.25 MG/2ML nebulizer solution Take 2 mLs (0.25 mg total) by nebulization 4 (four)  times daily.  60 mL  3  . cholecalciferol (VITAMIN D) 1000 UNITS tablet Take 2,000 Units by mouth daily.      . clopidogrel (PLAVIX) 75 MG tablet Take 1 tablet (75 mg total) by mouth daily with breakfast.  30 tablet  6  . digoxin (LANOXIN) 0.25 MG tablet Take 0.25 mg by mouth daily.       Marland Kitchen diltiazem (CARDIZEM CD) 360 MG 24 hr capsule Take 1 capsule (360 mg total) by mouth daily.  30 capsule  3  . feeding supplement (ENSURE COMPLETE) LIQD Take 237 mLs by mouth 2 (two) times daily between meals.  60 Bottle  3    . furosemide (LASIX) 20 MG tablet Take 3 tablets (60 mg total) by mouth daily.  90 tablet  3  . isosorbide mononitrate (IMDUR) 60 MG 24 hr tablet Take 60 mg by mouth daily.        Marland Kitchen levalbuterol (XOPENEX HFA) 45 MCG/ACT inhaler Inhale 1-2 puffs into the lungs every 4 (four) hours as needed for wheezing.  1 Inhaler  12  . levalbuterol (XOPENEX) 0.63 MG/3ML nebulizer solution Take 3 mLs (0.63 mg total) by nebulization every 3 (three) hours as needed for wheezing or shortness of breath.  3 mL  0  . morphine (MS CONTIN) 15 MG 12 hr tablet Take 1 tablet (15 mg total) by mouth daily.  30 tablet  0  . nitroGLYCERIN (NITROSTAT) 0.4 MG SL tablet Place 0.4 mg under the tongue every 5 (five) minutes as needed. For chest pain      . pantoprazole (PROTONIX) 40 MG tablet Take 1 tablet (40 mg total) by mouth daily at 12 noon.  30 tablet  3  . Potassium Gluconate 550 MG TABS Take 1 tablet by mouth 3 (three) times daily.       . predniSONE (DELTASONE) 20 MG tablet Take 2 tablets (40 mg total) by mouth daily.  30 tablet  3  . rosuvastatin (CRESTOR) 20 MG tablet Take 20 mg by mouth daily.      . sennosides-docusate sodium (SENOKOT-S) 8.6-50 MG tablet Take 1 tablet by mouth at bedtime.      . Tamsulosin HCl (FLOMAX) 0.4 MG CAPS Take 0.4 mg by mouth daily.      Marland Kitchen warfarin (COUMADIN) 2.5 MG tablet Take 1 tablet (2.5 mg total) by mouth one time only at 6 PM.  30 tablet  3    ADDITIONAL HOME MEDICATIONS: see nursing home medication administration record  PHYSICIANS INVOLVED IN CARE: Eric Form (PCP), Ronny Flurry (card), Dr. Ian Malkin (pulmonary)  Past Surgical History  Procedure Date  . Hip arthroplasty     s/p right 09/27/10 and s/p left 09/24/10  . Back surgery   . Knee surgery   . Pacemaker insertion 12/2010    St. Jude single-chamber   . Cardiac catheterization 09/2009, 06/2007    PTCA to distal LAD    Family History  Problem Relation Age of Onset  . Heart attack Mother 71  . Heart attack Father 24   . Cancer Other     siblings  . Heart attack Other      Social History:  reports that he quit smoking about 2 years ago. His smoking use included Cigarettes. He has a 100 pack-year smoking history. He has never used smokeless tobacco. He reports that he does not drink alcohol or use illicit drugs.  Allergies: No Known Allergies   ROS: arthritis, diabetes, emphysema, heart attack, Heart disease, heart palpitation,  high blood pressure and hypertension, peripheral vascular disease, osteoarthritis, DM2  PHYSICAL EXAM: Blood pressure 159/67, pulse 103, temperature 99.5 F (37.5 C), temperature source Core (Comment), resp. rate 31, height 5\' 9"  (1.753 m), weight 69 kg (152 lb 1.9 oz), SpO2 99.00%. In general, the patient is a elderly white man in moderate respiratory distress on BiPAP support.  HEENT exam was grossly normal and limited by BiPAP mask, neck was supple without jugular venous distention or carotid bruit, chest had hyperexpanded lungs with slow expiration and no wheezing or use of secondary muscles of respiration, heart had a slightly irregular rhythm without significant murmur or gallop, abdomen had normal bowel sounds and no hepatosplenomegaly or tenderness, extremities had bilateral 1+ leg edema, neurologic exam: He was alert and could answer questions by nodding his head, and he could move all extremities well.  Results for orders placed during the hospital encounter of 04/04/12 (from the past 48 hour(s))  BLOOD GAS, ARTERIAL     Status: Abnormal   Collection Time   04/04/12  9:30 AM      Component Value Range Comment   FIO2 0.35      Delivery systems BILEVEL POSITIVE AIRWAY PRESSURE      Mode BILEVEL POSITIVE AIRWAY PRESSURE      Inspiratory PAP 10      Expiratory PAP 5      pH, Arterial 7.396  7.350 - 7.450    pCO2 arterial 58.0 (*) 35.0 - 45.0 mmHg    pO2, Arterial 73.2 (*) 80.0 - 100.0 mmHg    Bicarbonate 34.8 (*) 20.0 - 24.0 mEq/L    TCO2 29.9  0 - 100 mmol/L     Acid-Base Excess 8.0 (*) 0.0 - 2.0 mmol/L    O2 Saturation 94.5      Patient temperature 98.6      Collection site LEFT BRACHIAL      Drawn by [id number]      Sample type ARTERIAL DRAW      Allens test (pass/fail) PASS  PASS   URINALYSIS, ROUTINE W REFLEX MICROSCOPIC     Status: Abnormal   Collection Time   04/04/12  9:32 AM      Component Value Range Comment   Color, Urine YELLOW  YELLOW    APPearance CLOUDY (*) CLEAR    Specific Gravity, Urine 1.029  1.005 - 1.030    pH 5.5  5.0 - 8.0    Glucose, UA >1000 (*) NEGATIVE mg/dL    Hgb urine dipstick SMALL (*) NEGATIVE    Bilirubin Urine NEGATIVE  NEGATIVE    Ketones, ur NEGATIVE  NEGATIVE mg/dL    Protein, ur 045 (*) NEGATIVE mg/dL    Urobilinogen, UA 0.2  0.0 - 1.0 mg/dL    Nitrite NEGATIVE  NEGATIVE    Leukocytes, UA SMALL (*) NEGATIVE   URINE MICROSCOPIC-ADD ON     Status: Abnormal   Collection Time   04/04/12  9:32 AM      Component Value Range Comment   WBC, UA 21-50  <3 WBC/hpf    RBC / HPF 0-2  <3 RBC/hpf    Bacteria, UA MANY (*) RARE    Casts GRANULAR CAST (*) NEGATIVE   LACTIC ACID, PLASMA     Status: Abnormal   Collection Time   04/04/12  9:55 AM      Component Value Range Comment   Lactic Acid, Venous 4.3 (*) 0.5 - 2.2 mmol/L   PROTIME-INR     Status: Abnormal  Collection Time   04/04/12 10:10 AM      Component Value Range Comment   Prothrombin Time 28.2 (*) 11.6 - 15.2 seconds RESULT CHECKED   INR 2.59 (*) 0.00 - 1.49   APTT     Status: Abnormal   Collection Time   04/04/12 10:10 AM      Component Value Range Comment   aPTT 39 (*) 24 - 37 seconds   BASIC METABOLIC PANEL     Status: Abnormal   Collection Time   04/04/12 10:10 AM      Component Value Range Comment   Sodium 132 (*) 135 - 145 mEq/L    Potassium 3.6  3.5 - 5.1 mEq/L    Chloride 86 (*) 96 - 112 mEq/L    CO2 34 (*) 19 - 32 mEq/L    Glucose, Bld 399 (*) 70 - 99 mg/dL    BUN 19  6 - 23 mg/dL    Creatinine, Ser 4.09  0.50 - 1.35 mg/dL    Calcium 9.4   8.4 - 10.5 mg/dL    GFR calc non Af Amer 87 (*) >90 mL/min    GFR calc Af Amer >90  >90 mL/min   CBC WITH DIFFERENTIAL     Status: Abnormal   Collection Time   04/04/12 10:10 AM      Component Value Range Comment   WBC 7.0  4.0 - 10.5 K/uL    RBC 5.05  4.22 - 5.81 MIL/uL    Hemoglobin 15.4  13.0 - 17.0 g/dL    HCT 81.1  91.4 - 78.2 %    MCV 92.1  78.0 - 100.0 fL    MCH 30.5  26.0 - 34.0 pg    MCHC 33.1  30.0 - 36.0 g/dL    RDW 95.6  21.3 - 08.6 %    Platelets 186  150 - 400 K/uL    Neutrophils Relative 86 (*) 43 - 77 %    Neutro Abs 6.0  1.7 - 7.7 K/uL    Lymphocytes Relative 4 (*) 12 - 46 %    Lymphs Abs 0.3 (*) 0.7 - 4.0 K/uL    Monocytes Relative 10  3 - 12 %    Monocytes Absolute 0.7  0.1 - 1.0 K/uL    Eosinophils Relative 0  0 - 5 %    Eosinophils Absolute 0.0  0.0 - 0.7 K/uL    Basophils Relative 0  0 - 1 %    Basophils Absolute 0.0  0.0 - 0.1 K/uL   TROPONIN I     Status: Normal   Collection Time   04/04/12 10:10 AM      Component Value Range Comment   Troponin I <0.30  <0.30 ng/mL   DIGOXIN LEVEL     Status: Normal   Collection Time   04/04/12 10:10 AM      Component Value Range Comment   Digoxin Level 1.2  0.8 - 2.0 ng/mL   MAGNESIUM     Status: Normal   Collection Time   04/04/12 10:10 AM      Component Value Range Comment   Magnesium 2.0  1.5 - 2.5 mg/dL   HEPATIC FUNCTION PANEL     Status: Abnormal   Collection Time   04/04/12 10:10 AM      Component Value Range Comment   Total Protein 6.7  6.0 - 8.3 g/dL    Albumin 3.3 (*) 3.5 - 5.2 g/dL    AST 13  0 -  37 U/L    ALT 29  0 - 53 U/L    Alkaline Phosphatase 112  39 - 117 U/L    Total Bilirubin 0.6  0.3 - 1.2 mg/dL    Bilirubin, Direct 0.2  0.0 - 0.3 mg/dL    Indirect Bilirubin 0.4  0.3 - 0.9 mg/dL   PRO B NATRIURETIC PEPTIDE     Status: Abnormal   Collection Time   04/04/12 12:49 PM      Component Value Range Comment   Pro B Natriuretic peptide (BNP) 5095.0 (*) 0 - 450 pg/mL   GLUCOSE, CAPILLARY      Status: Abnormal   Collection Time   04/04/12  3:45 PM      Component Value Range Comment   Glucose-Capillary 470 (*) 70 - 99 mg/dL    Comment 1 Notify RN      Comment 2 Documented in Chart      Dg Chest Portable 1 View  04/04/2012  *RADIOLOGY REPORT*  Clinical Data: Shortness of breath.  Respiratory distress.  PORTABLE CHEST - 1 VIEW  Comparison: Chest x-ray 03/10/2012.  Findings: Blunting of the right costophrenic sulcus suggesting a small right-sided pleural effusion.  No definite consolidative airspace disease.  Retrocardiac opacity favored to represent subsegmental atelectasis.  Pulmonary vasculature and the cardiomediastinal silhouette are within normal limits. Atherosclerosis in the thoracic aorta.  Left-sided pacemaker device in place with lead tip projecting over the expected location of the right ventricular apex.  IMPRESSION: 1.  Small right-sided pleural effusion. 2.  Subsegmental atelectasis in the left lower lobe. 3.  Atherosclerosis.  Original Report Authenticated By: Florencia Reasons, M.D.     Assessment/Plan #1 Dyspnea: Most likely from COPD exacerbation given his physical exam findings.  His chest x-ray does not suggest pneumonia or congestive heart failure.  Medication administration will be problematic because of the BiPAP mask.  We will treat him with IV Solu-Medrol, frequent nebulizers, and we will adjust by mouth medications to alternative forms. He does have a low-grade temperature and minimal airspace disease.  Given his desire condition we will treat him with broad-spectrum antibiotics for possible pneumonia versus urinary tract infection #2 Diabetes Mellitus, Type [id number] stable and we will treat him with sliding scale insulin as needed.  His blood glucose levels are likely to rise significantly with high dose IV corticosteroids. #3 Coronary Artery Disease: Clinically stable and we will adjust medications normally given by mouth. #4 Bacteriuria: He may have a  urinary tract infection and he has been placed on broad-spectrum antibiotics pending results of urine culture. #5 Ethical Concerns: We'll continue previous request for do not resuscitate and do not intubate orders.  Darlene Bartelt G 04/04/2012, 5:19 PM

## 2012-04-04 NOTE — ED Provider Notes (Signed)
History     CSN: [id number]  Arrival date & time 04/04/12  0906   First MD Initiated Contact with Patient 04/04/12 [phone]      No chief complaint on file.   (Consider location/radiation/quality/duration/timing/severity/associated sxs/prior treatment) HPI Comments: Level 5 caveat due to patient's respiratory distress. Pt with hx of COPD, CHF, A-fib comes in with cc of respiratory distress from Nursing home. Per EMS, they were called when patient was noted to be desaturating in the 80s despite breathing treatment at the nursing home. At their arrival, patient was tripoding. CPAP was initiated by EMS after they gave him 1 more breathing treatment and solumedrol 125 mg ivp. Patients respiratory status declined, and he was switched over to bagging.  Pt arrived to ED in respiratory distress. He is alert, but tachypneic, with accessory muscle use and retractions. Pt able to answer with a nod. Pt states that his SOB started y'day and got worse. He has a cough, producing green phlegm. Pt unsure if this is from COPD or CHF. Pt is DNR/DNI and confirms the status.   The history is provided by the patient, medical records, the EMS personnel and the nursing home.    Past Medical History  Diagnosis Date  . Pneumonia 12/06/10    healthcare-associated/ left lower lobe  . COPD (chronic obstructive pulmonary disease)     severe stage IV  . Atrial fibrillation     on Coumadin with St. Jude single-chamber pacemaker  . Diabetes mellitus     type 2; s/p Prednisone therapy for pneumonia 12/2010  . Coronary artery disease     s/p anterior STEMI 09/2009; cath. revealed mid LAD 40%, distal LAD 100%- PTCA, prox. RCA 40%, mid. RCA 100% with good collateral filling of PDA; EF 25%; NSTEMI 07/2011 - PCI/DES 100% LCX  . ST elevation (STEMI) myocardial infarction 01//11/12    anterior  . CHF (congestive heart failure)     EF 25% on 09/26/10; EF 50-55% and grade 1 diastolic dysfunction on ECHO 08/2010   .  Hypertension   . Hyperlipidemia   . AAA (abdominal aortic aneurysm)     3 cm infrarenal abdominal aortic aneurysm per aorta ultrasound 03/2010  . Osteoarthritis     s/p bilat hip arthroplasty  . Angina   . Ischemic cardiomyopathy     EF 35% LHC 11/12  . GERD (gastroesophageal reflux disease)   . Hypercholesteremia   . PVD (peripheral vascular disease)   . Anxiety     Past Surgical History  Procedure Date  . Hip arthroplasty     s/p right 09/27/10 and s/p left 09/24/10  . Back surgery   . Knee surgery   . Pacemaker insertion 12/2010    St. Jude single-chamber   . Cardiac catheterization 09/2009, 06/2007    PTCA to distal LAD    Family History  Problem Relation Age of Onset  . Heart attack Mother 8  . Heart attack Father 4  . Cancer Other     siblings  . Heart attack Other     History  Substance Use Topics  . Smoking status: Former Smoker -- 2.0 packs/day for 50 years    Types: Cigarettes    Quit date: 09/02/2009  . Smokeless tobacco: Never Used   Comment: smoked for 53yrs quit for 39yrs started back & smoked for 10ys  . Alcohol Use: No     occ - alcohol      Review of Systems  Constitutional: Positive for activity change. Negative  for fever.  Respiratory: Positive for shortness of breath.   Cardiovascular: Negative for chest pain.    Allergies  Review of patient's allergies indicates no known allergies.  Home Medications   Current Outpatient Rx  Name Route Sig Dispense Refill  . BISOPROLOL FUMARATE 10 MG PO TABS Oral Take 1 tablet (10 mg total) by mouth daily. 30 tablet 3  . BUDESONIDE 0.25 MG/2ML IN SUSP Nebulization Take 2 mLs (0.25 mg total) by nebulization 4 (four) times daily. 60 mL 3  . CALCIUM CARBONATE ANTACID 500 MG PO CHEW Oral Chew 2 tablets by mouth daily as needed. For indigestion/heartburn    . VITAMIN D 1000 UNITS PO TABS Oral Take 2,000 Units by mouth daily.    Marland Kitchen CLOPIDOGREL BISULFATE 75 MG PO TABS Oral Take 1 tablet (75 mg total) by  mouth daily with breakfast. 30 tablet 6  . DIGOXIN 0.25 MG PO TABS Oral Take 250 mcg by mouth daily.      Marland Kitchen DILTIAZEM HCL ER COATED BEADS 360 MG PO CP24 Oral Take 1 capsule (360 mg total) by mouth daily. 30 capsule 3  . ENSURE COMPLETE PO LIQD Oral Take 237 mLs by mouth 2 (two) times daily between meals. 60 Bottle 3  . FUROSEMIDE 20 MG PO TABS Oral Take 3 tablets (60 mg total) by mouth daily. 90 tablet 3  . ISOSORBIDE MONONITRATE ER 60 MG PO TB24 Oral Take 60 mg by mouth daily.      Marland Kitchen LEVALBUTEROL TARTRATE 45 MCG/ACT IN AERO Inhalation Inhale 1-2 puffs into the lungs every 4 (four) hours as needed for wheezing. 1 Inhaler 12  . LEVALBUTEROL HCL 0.63 MG/3ML IN NEBU Nebulization Take 3 mLs (0.63 mg total) by nebulization every 3 (three) hours as needed for wheezing or shortness of breath. 3 mL 0  . MORPHINE SULFATE ER 15 MG PO TBCR Oral Take 1 tablet (15 mg total) by mouth daily. 30 tablet 0  . NITROGLYCERIN 0.4 MG SL SUBL Sublingual Place 0.4 mg under the tongue every 5 (five) minutes as needed. For chest pain    . PANTOPRAZOLE SODIUM 40 MG PO TBEC Oral Take 1 tablet (40 mg total) by mouth daily at 12 noon. 30 tablet 3  . POTASSIUM GLUCONATE 550 MG PO TABS Oral Take 1 tablet by mouth daily.     Marland Kitchen PREDNISONE 20 MG PO TABS Oral Take 2 tablets (40 mg total) by mouth daily. 30 tablet 3  . ROSUVASTATIN CALCIUM 20 MG PO TABS Oral Take 20 mg by mouth daily.    . SENNA-DOCUSATE SODIUM 8.6-50 MG PO TABS Oral Take 1 tablet by mouth at bedtime.    . TAMSULOSIN HCL 0.4 MG PO CAPS Oral Take 0.4 mg by mouth daily.    . WARFARIN SODIUM 2.5 MG PO TABS Oral Take 1 tablet (2.5 mg total) by mouth one time only at 6 PM. 30 tablet 3    There were no vitals taken for this visit.  Physical Exam  Constitutional: He is oriented to person, place, and time. He appears well-developed and well-nourished.  Eyes: EOM are normal. Pupils are equal, round, and reactive to light.  Neck: No JVD present. No tracheal deviation  present.  Cardiovascular:       Irregularly irregular, tachycardic  Pulmonary/Chest: No stridor. He is in respiratory distress. He has no wheezes. He has no rales.       Poor air entry, no wheezing, no rales, tachypnea and accessory muscle use  Abdominal: Soft. He  exhibits no distension. There is no tenderness.  Musculoskeletal: He exhibits no edema.  Neurological: He is alert and oriented to person, place, and time.  Skin: Skin is warm and dry.    ED Course  Procedures (including critical care time)  Labs Reviewed  BLOOD GAS, ARTERIAL - Abnormal; Notable for the following:    pCO2 arterial 58.0 (*)     pO2, Arterial 73.2 (*)     Bicarbonate 34.8 (*)     Acid-Base Excess 8.0 (*)     All other components within normal limits  URINALYSIS, ROUTINE W REFLEX MICROSCOPIC  URINE CULTURE  LACTIC ACID, PLASMA  PROTIME-INR  APTT  BASIC METABOLIC PANEL  CBC WITH DIFFERENTIAL  TROPONIN I  CULTURE, BLOOD (ROUTINE X 2)  CULTURE, BLOOD (ROUTINE X 2)  DIGOXIN LEVEL  MAGNESIUM  HEPATIC FUNCTION PANEL   Dg Chest Portable 1 View  04/04/2012  *RADIOLOGY REPORT*  Clinical Data: Shortness of breath.  Respiratory distress.  PORTABLE CHEST - 1 VIEW  Comparison: Chest x-ray 03/10/2012.  Findings: Blunting of the right costophrenic sulcus suggesting a small right-sided pleural effusion.  No definite consolidative airspace disease.  Retrocardiac opacity favored to represent subsegmental atelectasis.  Pulmonary vasculature and the cardiomediastinal silhouette are within normal limits. Atherosclerosis in the thoracic aorta.  Left-sided pacemaker device in place with lead tip projecting over the expected location of the right ventricular apex.  IMPRESSION: 1.  Small right-sided pleural effusion. 2.  Subsegmental atelectasis in the left lower lobe. 3.  Atherosclerosis.  Original Report Authenticated By: Florencia Reasons, M.D.     No diagnosis found.    MDM   Date: 04/04/2012  Rate: 102  Rhythm:  atrial fibrillation  QRS Axis: right  Intervals: normal  ST/T Wave abnormalities: ST depressions laterally - v4 to v6, unchanged  Conduction Disutrbances:none  Narrative Interpretation:   Old EKG Reviewed: unchanged PVCs seen as well EKG from arrival   Date: 04/04/2012  Rate: 111  Rhythm: atrial fibrillation, PVCs  QRS Axis: right  Intervals: normal  ST/T Wave abnormalities: nonspecific ST changes  Conduction Disutrbances:none  Narrative Interpretation:   Old EKG Reviewed: none available - ERROR - UNCHANGED.  Patient with hx of COPD, CHF with EF in the 40s and a-fib comes in with cc of respiratory failure. Pt in respiratory distress and also hypoxic. Exam not suggestive of CHF. Pt does have poor air entry and cough, possible pneumonia. The concern for PE is low, patient is on anticoagulation, no signs of DVT. ACS syndrome is also on the ddx. No chest pain. Pt's EKG has some ST depression in the lateral leads, but they are old, and repeat EKG in the Ed shows no dynamic changes.  Will switch to BiPAP from CPAP. Pt's ABS shows no overt hypercapnia, and O2 sats improved. Other labs are pending. CXR shows no pulmonary edema or an infiltrate. Will need ICU admission. We will start Antibiotics in the ED as presumed HCAP.           12:49 PM Patient sill alert. Saturating well with CPAP, still tachypneic, but not in the same amount of distress as before. Medicine consulted for admission. Spoke with daughter over the phone as well. Labs wise - no acidosis, lactate is 4+ (likely from work of breathing, but sepsis is also possible).  All the other labs dont show significant abnormality. Lung exam does show some wheezing now - faint.  COPD exacerbation vs. Sepsis syndrome is the working diagnosis for the respiratory failure.  Derwood Kaplan, MD 04/04/12 1739

## 2012-04-04 NOTE — ED Notes (Signed)
ZOX:WRUE<AV> Expected date:04/04/12<BR> Expected time: 8:54 AM<BR> Means of arrival:Ambulance<BR> Comments:<BR> CHF Resp Diff

## 2012-04-05 ENCOUNTER — Encounter (HOSPITAL_COMMUNITY): Payer: Self-pay | Admitting: *Deleted

## 2012-04-05 LAB — CBC
HCT: 44.7 % (ref 39.0–52.0)
MCHC: 32.4 g/dL (ref 30.0–36.0)
Platelets: 151 10*3/uL (ref 150–400)
RDW: 13 % (ref 11.5–15.5)
WBC: 6.3 10*3/uL (ref 4.0–10.5)

## 2012-04-05 LAB — COMPREHENSIVE METABOLIC PANEL
Alkaline Phosphatase: 84 U/L (ref 39–117)
BUN: 21 mg/dL (ref 6–23)
CO2: 38 mEq/L — ABNORMAL HIGH (ref 19–32)
GFR calc Af Amer: >90 mL/min (ref >90)
GFR calc non Af Amer: >90 mL/min (ref >90)
Glucose, Bld: 228 mg/dL — ABNORMAL HIGH (ref 70–99)
Potassium: 3.2 mEq/L — ABNORMAL LOW (ref 3.5–5.1)
Total Bilirubin: 0.4 mg/dL (ref 0.3–1.2)
Total Protein: 6.1 g/dL (ref 6.0–8.3)

## 2012-04-05 LAB — PROTIME-INR
INR: 2.82 — ABNORMAL HIGH (ref 0.00–1.49)
Prothrombin Time: 30.1 seconds — ABNORMAL HIGH (ref 11.6–15.2)

## 2012-04-05 LAB — GLUCOSE, CAPILLARY
Glucose-Capillary: 235 mg/dL — ABNORMAL HIGH (ref 70–99)
Glucose-Capillary: 239 mg/dL — ABNORMAL HIGH (ref 70–99)

## 2012-04-05 MED ORDER — CHLORHEXIDINE GLUCONATE CLOTH 2 % EX PADS
6.0000 | MEDICATED_PAD | Freq: Every day | CUTANEOUS | Status: AC
  Administered 2012-04-05 – 2012-04-08 (×4): 6 via TOPICAL

## 2012-04-05 MED ORDER — METOPROLOL TARTRATE 1 MG/ML IV SOLN
INTRAVENOUS | Status: AC
  Administered 2012-04-05: 5 mg via INTRAVENOUS
  Filled 2012-04-05: qty 5

## 2012-04-05 MED ORDER — METOPROLOL TARTRATE 1 MG/ML IV SOLN
5.0000 mg | Freq: Three times a day (TID) | INTRAVENOUS | Status: DC
  Administered 2012-04-05 – 2012-04-08 (×9): 5 mg via INTRAVENOUS
  Filled 2012-04-05 (×12): qty 5

## 2012-04-05 MED ORDER — DILTIAZEM LOAD VIA INFUSION
15.0000 mg | Freq: Once | INTRAVENOUS | Status: AC
  Administered 2012-04-05: 15 mg via INTRAVENOUS

## 2012-04-05 MED ORDER — METOPROLOL TARTRATE 1 MG/ML IV SOLN
5.0000 mg | Freq: Two times a day (BID) | INTRAVENOUS | Status: DC
  Administered 2012-04-05: 5 mg via INTRAVENOUS
  Filled 2012-04-05 (×2): qty 5

## 2012-04-05 MED ORDER — METOPROLOL TARTRATE 1 MG/ML IV SOLN
5.0000 mg | Freq: Once | INTRAVENOUS | Status: AC
  Administered 2012-04-05: 5 mg via INTRAVENOUS

## 2012-04-05 MED ORDER — SPIRONOLACTONE 50 MG PO TABS
50.0000 mg | ORAL_TABLET | Freq: Every day | ORAL | Status: DC
  Administered 2012-04-05 – 2012-04-07 (×3): 50 mg via ORAL
  Filled 2012-04-05 (×4): qty 1

## 2012-04-05 MED ORDER — WARFARIN - PHARMACIST DOSING INPATIENT: Freq: Every day | Status: DC

## 2012-04-05 MED ORDER — MUPIROCIN 2 % EX OINT
1.0000 "application " | TOPICAL_OINTMENT | Freq: Two times a day (BID) | CUTANEOUS | Status: AC
  Administered 2012-04-05 – 2012-04-09 (×10): 1 via NASAL
  Filled 2012-04-05: qty 22

## 2012-04-05 NOTE — Progress Notes (Signed)
Dr. Jarold Motto notified pt's HR 120's to 160's ; Cardizem gtt bolused 15mg .  RN continuing to monitor.

## 2012-04-05 NOTE — Progress Notes (Signed)
Dr. Jarold Motto notified pt's HR 120's to 150's; an extra dose of lopressor 5mg  given.  RN continuing to monitor.

## 2012-04-05 NOTE — Progress Notes (Signed)
ANTICOAGULATION CONSULT NOTE - Initial Consult  Pharmacy Consult for warfarin Indication: atrial fibrillation  No Known Allergies  Patient Measurements: Height: 5\' 9"  (175.3 cm) Weight: 152 lb 1.9 oz (69 kg) IBW/kg (Calculated) : 70.7  Heparin Dosing Weight:   Vital Signs: Temp: 99.1 F (37.3 C) (07/21 1200) Temp src: Core (Comment) (07/21 1200) BP: 124/75 mmHg (07/21 0630) Pulse Rate: 64  (07/21 0600)  Labs:  Basename 04/05/12 0335 04/04/12 1010  HGB 14.5 15.4  HCT 44.7 46.5  PLT 151 186  APTT -- 39*  LABPROT 30.1* 28.2*  INR 2.82* 2.59*  HEPARINUNFRC -- --  CREATININE 0.59 0.69  CKTOTAL -- --  CKMB -- --  TROPONINI <0.30 <0.30    Estimated Creatinine Clearance: 70.7 ml/min (by C-G formula based on Cr of 0.59).   Medical History: Past Medical History  Diagnosis Date  . Pneumonia 12/06/10    healthcare-associated/ left lower lobe  . COPD (chronic obstructive pulmonary disease)     severe stage IV  . Atrial fibrillation     on Coumadin with St. Jude single-chamber pacemaker  . Diabetes mellitus     type 2; s/p Prednisone therapy for pneumonia 12/2010  . Coronary artery disease     s/p anterior STEMI 09/2009; cath. revealed mid LAD 40%, distal LAD 100%- PTCA, prox. RCA 40%, mid. RCA 100% with good collateral filling of PDA; EF 25%; NSTEMI 07/2011 - PCI/DES 100% LCX  . ST elevation (STEMI) myocardial infarction 01//11/12    anterior  . CHF (congestive heart failure)     EF 25% on 09/26/10; EF 50-55% and grade 1 diastolic dysfunction on ECHO 08/2010   . Hypertension   . Hyperlipidemia   . AAA (abdominal aortic aneurysm)     3 cm infrarenal abdominal aortic aneurysm per aorta ultrasound 03/2010  . Osteoarthritis     s/p bilat hip arthroplasty  . Angina   . Ischemic cardiomyopathy     EF 35% LHC 11/12  . GERD (gastroesophageal reflux disease)   . Hypercholesteremia   . PVD (peripheral vascular disease)   . Anxiety     Medications:  Prescriptions prior  to admission  Medication Sig Dispense Refill  . guaiFENesin (MUCINEX) 600 MG 12 hr tablet Take 600 mg by mouth 2 (two) times daily.      Marland Kitchen neomycin-bacitracin-polymyxin (NEOSPORIN) ointment Apply topically See admin instructions. CLEANSE LEFT FOREARM SKIN TEAR WITH SALINE AND APPLY OINTMENT COVER WITH GAUZE AND TEGADERM EVERY 3 DAYS UNTIL HEALED      . bisoprolol (ZEBETA) 10 MG tablet Take 1 tablet (10 mg total) by mouth daily.  30 tablet  3  . budesonide (PULMICORT) 0.25 MG/2ML nebulizer solution Take 2 mLs (0.25 mg total) by nebulization 4 (four) times daily.  60 mL  3  . cholecalciferol (VITAMIN D) 1000 UNITS tablet Take 2,000 Units by mouth daily.      . clopidogrel (PLAVIX) 75 MG tablet Take 1 tablet (75 mg total) by mouth daily with breakfast.  30 tablet  6  . digoxin (LANOXIN) 0.25 MG tablet Take 0.25 mg by mouth daily.       Marland Kitchen diltiazem (CARDIZEM CD) 360 MG 24 hr capsule Take 1 capsule (360 mg total) by mouth daily.  30 capsule  3  . feeding supplement (ENSURE COMPLETE) LIQD Take 237 mLs by mouth 2 (two) times daily between meals.  60 Bottle  3  . furosemide (LASIX) 20 MG tablet Take 3 tablets (60 mg total) by mouth daily.  90  tablet  3  . isosorbide mononitrate (IMDUR) 60 MG 24 hr tablet Take 60 mg by mouth daily.        Marland Kitchen levalbuterol (XOPENEX HFA) 45 MCG/ACT inhaler Inhale 1-2 puffs into the lungs every 4 (four) hours as needed for wheezing.  1 Inhaler  12  . levalbuterol (XOPENEX) 0.63 MG/3ML nebulizer solution Take 3 mLs (0.63 mg total) by nebulization every 3 (three) hours as needed for wheezing or shortness of breath.  3 mL  0  . morphine (MS CONTIN) 15 MG 12 hr tablet Take 1 tablet (15 mg total) by mouth daily.  30 tablet  0  . nitroGLYCERIN (NITROSTAT) 0.4 MG SL tablet Place 0.4 mg under the tongue every 5 (five) minutes as needed. For chest pain      . pantoprazole (PROTONIX) 40 MG tablet Take 1 tablet (40 mg total) by mouth daily at 12 noon.  30 tablet  3  . Potassium Gluconate  550 MG TABS Take 1 tablet by mouth 3 (three) times daily.       . predniSONE (DELTASONE) 20 MG tablet Take 2 tablets (40 mg total) by mouth daily.  30 tablet  3  . rosuvastatin (CRESTOR) 20 MG tablet Take 20 mg by mouth daily.      . sennosides-docusate sodium (SENOKOT-S) 8.6-50 MG tablet Take 1 tablet by mouth at bedtime.      . Tamsulosin HCl (FLOMAX) 0.4 MG CAPS Take 0.4 mg by mouth daily.      Marland Kitchen warfarin (COUMADIN) 2.5 MG tablet Take 1 tablet (2.5 mg total) by mouth one time only at 6 PM.  30 tablet  3    Assessment: Patient on chronic warfarin for Afib.  INR at goal.  Goal of Therapy:  INR 2-3    Plan:  Daily INR Warfarin 2.5mg  po x1 at [address], [place] 04/05/2012,1:22 PM

## 2012-04-05 NOTE — Progress Notes (Signed)
Subjective: Having much less dyspnea today, off BiPap, still sleepy and very weak  Objective: Vital signs in last 24 hours: Temp:  [97.5 F (36.4 C)-100.5 F (38.1 C)] 99 F (37.2 C) (07/21 0600) Pulse Rate:  [48-132] 64  (07/21 0600) Resp:  [13-34] 17  (07/21 0600) BP: (106-160)/(42-126) 124/75 mmHg (07/21 0630) SpO2:  [88 %-100 %] 100 % (07/21 0600) FiO2 (%):  [35 %-60 %] 60 % (07/21 0500) Weight:  [69 kg (152 lb 1.9 oz)] 69 kg (152 lb 1.9 oz) (07/20 1530) Weight change:    Intake/Output from previous day: 07/20 0701 - 07/21 0700 In: 352.3 [I.V.:268.3; IV Piggyback:54] Out: 2225 [Urine:2225]   General appearance: no distress and sleepy, opens eyes briefly with exam, has occasional dry cough Resp: bilateral scattered wheezing, no use of secondary muscles of respiration Cardio: irregularly irregular rhythm GI: soft, non-tender; bowel sounds normal; no masses,  no organomegaly Neurologic: Mental status: sleepy opens eyes briefly with exam  Lab Results:  Basename 04/05/12 0335 04/04/12 1010  WBC 6.3 7.0  HGB 14.5 15.4  HCT 44.7 46.5  PLT 151 186   BMET  Basename 04/05/12 0335 04/04/12 1010  NA 138 132*  K 3.2* 3.6  CL 93* 86*  CO2 38* 34*  GLUCOSE 228* 399*  BUN 21 19  CREATININE 0.59 0.69  CALCIUM 9.2 9.4   CMET CMP     Component Value Date/Time   NA 138 04/05/2012 0335   K 3.2* 04/05/2012 0335   CL 93* 04/05/2012 0335   CO2 38* 04/05/2012 0335   GLUCOSE 228* 04/05/2012 0335   BUN 21 04/05/2012 0335   CREATININE 0.59 04/05/2012 0335   CALCIUM 9.2 04/05/2012 0335   PROT 6.1 04/05/2012 0335   ALBUMIN 2.5* 04/05/2012 0335   AST 11 04/05/2012 0335   ALT 19 04/05/2012 0335   ALKPHOS 84 04/05/2012 0335   BILITOT 0.4 04/05/2012 0335   GFRNONAA >90 04/05/2012 0335   GFRAA >90 04/05/2012 0335    CBG (last 3)   Basename 04/05/12 0743 04/04/12 2228 04/04/12 1943  GLUCAP 235* 303* 358*    INR RESULTS:   Lab Results  Component Value Date   INR 2.82* 04/05/2012   INR 2.59* 04/04/2012   INR 2.84* 03/19/2012     Studies/Results: Dg Chest Portable 1 View  04/04/2012  *RADIOLOGY REPORT*  Clinical Data: Shortness of breath.  Respiratory distress.  PORTABLE CHEST - 1 VIEW  Comparison: Chest x-ray 03/10/2012.  Findings: Blunting of the right costophrenic sulcus suggesting a small right-sided pleural effusion.  No definite consolidative airspace disease.  Retrocardiac opacity favored to represent subsegmental atelectasis.  Pulmonary vasculature and the cardiomediastinal silhouette are within normal limits. Atherosclerosis in the thoracic aorta.  Left-sided pacemaker device in place with lead tip projecting over the expected location of the right ventricular apex.  IMPRESSION: 1.  Small right-sided pleural effusion. 2.  Subsegmental atelectasis in the left lower lobe. 3.  Atherosclerosis.  Original Report Authenticated By: Florencia Reasons, M.D.    Medications: I have reviewed the patient's current medications.  Assessment/Plan: #1 Dypsnea:  Improved and most likely from COPD exacerbation. Will continue current Rx for now. #2 Fever:  Stable and possibly from a UTI, urine culture result is pending  LOS: 1 day   Oumou Smead G 04/05/2012, 8:20 AM

## 2012-04-06 DIAGNOSIS — I4891 Unspecified atrial fibrillation: Secondary | ICD-10-CM

## 2012-04-06 LAB — BASIC METABOLIC PANEL
Chloride: 84 mEq/L — ABNORMAL LOW (ref 96–112)
Creatinine, Ser: 0.61 mg/dL (ref 0.50–1.35)
GFR calc Af Amer: >90 mL/min (ref >90)
GFR calc non Af Amer: >90 mL/min (ref >90)
Potassium: 3.9 mEq/L (ref 3.5–5.1)

## 2012-04-06 LAB — CBC
MCHC: 33.6 g/dL (ref 30.0–36.0)
MCV: 90.3 fL (ref 78.0–100.0)
Platelets: 157 10*3/uL (ref 150–400)
RDW: 12.6 % (ref 11.5–15.5)
WBC: 8.3 10*3/uL (ref 4.0–10.5)

## 2012-04-06 LAB — PRO B NATRIURETIC PEPTIDE: Pro B Natriuretic peptide (BNP): 2624 pg/mL — ABNORMAL HIGH (ref 0–450)

## 2012-04-06 LAB — GLUCOSE, CAPILLARY
Glucose-Capillary: 417 mg/dL — ABNORMAL HIGH (ref 70–99)
Glucose-Capillary: 276 mg/dL — ABNORMAL HIGH (ref 70–99)

## 2012-04-06 MED ORDER — IPRATROPIUM BROMIDE 0.02 % IN SOLN
0.5000 mg | Freq: Four times a day (QID) | RESPIRATORY_TRACT | Status: DC
  Administered 2012-04-06 – 2012-04-10 (×17): 0.5 mg via RESPIRATORY_TRACT
  Filled 2012-04-06 (×17): qty 2.5

## 2012-04-06 MED ORDER — INSULIN GLARGINE 100 UNIT/ML ~~LOC~~ SOLN
10.0000 [IU] | Freq: Two times a day (BID) | SUBCUTANEOUS | Status: DC
  Administered 2012-04-06: 10 [IU] via SUBCUTANEOUS

## 2012-04-06 MED ORDER — WARFARIN SODIUM 5 MG PO TABS
5.0000 mg | ORAL_TABLET | Freq: Once | ORAL | Status: AC
  Administered 2012-04-06: 5 mg via ORAL
  Filled 2012-04-06: qty 1

## 2012-04-06 MED ORDER — INSULIN GLARGINE 100 UNIT/ML ~~LOC~~ SOLN: 10.0000 [IU] | SUBCUTANEOUS | Status: DC

## 2012-04-06 MED ORDER — LEVALBUTEROL HCL 0.63 MG/3ML IN NEBU
0.6300 mg | INHALATION_SOLUTION | Freq: Four times a day (QID) | RESPIRATORY_TRACT | Status: DC
  Administered 2012-04-06 – 2012-04-10 (×17): 0.63 mg via RESPIRATORY_TRACT
  Filled 2012-04-06 (×21): qty 3

## 2012-04-06 MED ORDER — AMIODARONE HCL 200 MG PO TABS
200.0000 mg | ORAL_TABLET | Freq: Two times a day (BID) | ORAL | Status: DC
  Administered 2012-04-06 – 2012-04-12 (×13): 200 mg via ORAL
  Filled 2012-04-06 (×16): qty 1

## 2012-04-06 MED ORDER — METHYLPREDNISOLONE SODIUM SUCC 125 MG IJ SOLR
80.0000 mg | Freq: Four times a day (QID) | INTRAMUSCULAR | Status: DC
  Administered 2012-04-06 – 2012-04-07 (×3): 80 mg via INTRAVENOUS
  Filled 2012-04-06: qty 1.28
  Filled 2012-04-06: qty 2
  Filled 2012-04-06 (×2): qty 1.28
  Filled 2012-04-06: qty 2
  Filled 2012-04-06: qty 1.28

## 2012-04-06 MED ORDER — INSULIN ASPART 100 UNIT/ML ~~LOC~~ SOLN
16.0000 [IU] | Freq: Once | SUBCUTANEOUS | Status: AC
  Administered 2012-04-06: 16 [IU] via SUBCUTANEOUS

## 2012-04-06 MED ORDER — INSULIN GLARGINE 100 UNIT/ML ~~LOC~~ SOLN
10.0000 [IU] | Freq: Every day | SUBCUTANEOUS | Status: DC
  Administered 2012-04-06: 10 [IU] via SUBCUTANEOUS

## 2012-04-06 MED ORDER — INSULIN ASPART 100 UNIT/ML ~~LOC~~ SOLN
18.0000 [IU] | Freq: Once | SUBCUTANEOUS | Status: AC
  Administered 2012-04-06: 18 [IU] via SUBCUTANEOUS

## 2012-04-06 NOTE — Consult Note (Signed)
Reason for Consult:Atrial fib with rapid rate Referring Physician:Dr. [name] Primary Cardiologist: Dr. Lewayne Bunting  [name] is an 78 y.o. male.  HPI: We have been asked to see this gentleman in regard to help in controlling his rapid ventricular response to his chronic atrial fib.  Patient is known to me from his recent [facility] admission for exacerbation of COPD and rapid AF. His rate has always been difficult to control and at home he was on bisoprolol 10 mg daily, diltiazem 360 mg daily and lanoxin 0.25 mg daily. Since admission he is now on IV diltiazem 20 mg /hr and metoprolol 5 mg IV q8h and digoxin 0.25 mg daily. Denies chest pain.  Has had some long runs of monomorphic VT.  Patient is DNR and discussions with palliative care are underway. Most recent echo 11/08/11 showed EF 40-45%.  Past Medical History  Diagnosis Date  . Pneumonia 12/06/10    healthcare-associated/ left lower lobe  . COPD (chronic obstructive pulmonary disease)     severe stage IV  . Atrial fibrillation     on Coumadin with St. Jude single-chamber pacemaker  . Diabetes mellitus     type 2; s/p Prednisone therapy for pneumonia 12/2010  . Coronary artery disease     s/p anterior STEMI 09/2009; cath. revealed mid LAD 40%, distal LAD 100%- PTCA, prox. RCA 40%, mid. RCA 100% with good collateral filling of PDA; EF 25%; NSTEMI 07/2011 - PCI/DES 100% LCX  . ST elevation (STEMI) myocardial infarction 01//11/12    anterior  . CHF (congestive heart failure)     EF 25% on 09/26/10; EF 50-55% and grade 1 diastolic dysfunction on ECHO 08/2010   . Hypertension   . Hyperlipidemia   . AAA (abdominal aortic aneurysm)     3 cm infrarenal abdominal aortic aneurysm per aorta ultrasound 03/2010  . Osteoarthritis     s/p bilat hip arthroplasty  . Angina   . Ischemic cardiomyopathy     EF 35% LHC 11/12  . GERD (gastroesophageal reflux disease)   . Hypercholesteremia   . PVD (peripheral vascular disease)   . Anxiety      Past Surgical History  Procedure Date  . Hip arthroplasty     s/p right 09/27/10 and s/p left 09/24/10  . Back surgery   . Knee surgery   . Pacemaker insertion 12/2010    St. Jude single-chamber   . Cardiac catheterization 09/2009, 06/2007    PTCA to distal LAD    Family History  Problem Relation Age of Onset  . Heart attack Mother 67  . Heart attack Father 70  . Cancer Other     siblings  . Heart attack Other     Social History:  reports that he quit smoking about 2 years ago. His smoking use included Cigarettes. He has a 100 pack-year smoking history. He has never used smokeless tobacco. He reports that he does not drink alcohol or use illicit drugs.  Allergies: No Known Allergies  Medications: I have reviewed the patient's current medications.  Results for orders placed during the hospital encounter of 04/04/12 (from the past 48 hour(s))  GLUCOSE, CAPILLARY     Status: Abnormal   Collection Time   04/04/12  5:55 PM      Component Value Range Comment   Glucose-Capillary 476 (*) 70 - 99 mg/dL    Comment 1 Notify RN      Comment 2 Documented in Chart     MRSA PCR SCREENING  Status: Abnormal   Collection Time   04/04/12  6:23 PM      Component Value Range Comment   MRSA by PCR POSITIVE (*) NEGATIVE   CULTURE, EXPECTORATED SPUTUM-ASSESSMENT     Status: Normal   Collection Time   04/04/12  6:26 PM      Component Value Range Comment   Specimen Description SPUTUM      Special Requests Normal      Sputum evaluation        Value: THIS SPECIMEN IS ACCEPTABLE. RESPIRATORY CULTURE REPORT TO FOLLOW.   Report Status 04/04/2012 FINAL     CULTURE, RESPIRATORY     Status: Normal (Preliminary result)   Collection Time   04/04/12  6:26 PM      Component Value Range Comment   Specimen Description SPUTUM      Special Requests NONE      Gram Stain        Value: NO WBC SEEN     NO SQUAMOUS EPITHELIAL CELLS SEEN     RARE GRAM NEGATIVE COCCOBACILLI   Culture        Value:  ABUNDANT HAEMOPHILUS INFLUENZAE     Note: BETA LACTAMASE NEGATIVE   Report Status PENDING     GLUCOSE, CAPILLARY     Status: Abnormal   Collection Time   04/04/12  7:43 PM      Component Value Range Comment   Glucose-Capillary 358 (*) 70 - 99 mg/dL    Comment 1 Documented in Chart      Comment 2 Notify RN     GLUCOSE, CAPILLARY     Status: Abnormal   Collection Time   04/04/12 10:28 PM      Component Value Range Comment   Glucose-Capillary 303 (*) 70 - 99 mg/dL   TROPONIN I     Status: Normal   Collection Time   04/05/12  3:35 AM      Component Value Range Comment   Troponin I <0.30  <0.30 ng/mL   COMPREHENSIVE METABOLIC PANEL     Status: Abnormal   Collection Time   04/05/12  3:35 AM      Component Value Range Comment   Sodium 138  135 - 145 mEq/L    Potassium 3.2 (*) 3.5 - 5.1 mEq/L    Chloride 93 (*) 96 - 112 mEq/L    CO2 38 (*) 19 - 32 mEq/L    Glucose, Bld 228 (*) 70 - 99 mg/dL    BUN 21  6 - 23 mg/dL    Creatinine, Ser 1.61  0.50 - 1.35 mg/dL    Calcium 9.2  8.4 - 09.6 mg/dL    Total Protein 6.1  6.0 - 8.3 g/dL    Albumin 2.5 (*) 3.5 - 5.2 g/dL    AST 11  0 - 37 U/L    ALT 19  0 - 53 U/L    Alkaline Phosphatase 84  39 - 117 U/L    Total Bilirubin 0.4  0.3 - 1.2 mg/dL    GFR calc non Af Amer >90  >90 mL/min    GFR calc Af Amer >90  >90 mL/min   CBC     Status: Normal   Collection Time   04/05/12  3:35 AM      Component Value Range Comment   WBC 6.3  4.0 - 10.5 K/uL    RBC 4.86  4.22 - 5.81 MIL/uL    Hemoglobin 14.5  13.0 - 17.0 g/dL  HCT 44.7  39.0 - 52.0 %    MCV 92.0  78.0 - 100.0 fL    MCH 29.8  26.0 - 34.0 pg    MCHC 32.4  30.0 - 36.0 g/dL    RDW 16.1  09.6 - 04.5 %    Platelets 151  150 - 400 K/uL   PROTIME-INR     Status: Abnormal   Collection Time   04/05/12  3:35 AM      Component Value Range Comment   Prothrombin Time 30.1 (*) 11.6 - 15.2 seconds    INR 2.82 (*) 0.00 - 1.49   GLUCOSE, CAPILLARY     Status: Abnormal   Collection Time   04/05/12   7:43 AM      Component Value Range Comment   Glucose-Capillary 235 (*) 70 - 99 mg/dL    Comment 1 Documented in Chart      Comment 2 Notify RN     GLUCOSE, CAPILLARY     Status: Abnormal   Collection Time   04/05/12 11:59 AM      Component Value Range Comment   Glucose-Capillary 239 (*) 70 - 99 mg/dL    Comment 1 Notify RN      Comment 2 Documented in Chart     GLUCOSE, CAPILLARY     Status: Abnormal   Collection Time   04/05/12  4:08 PM      Component Value Range Comment   Glucose-Capillary 281 (*) 70 - 99 mg/dL    Comment 1 Documented in Chart      Comment 2 Notify RN     GLUCOSE, CAPILLARY     Status: Abnormal   Collection Time   04/05/12  9:32 PM      Component Value Range Comment   Glucose-Capillary 417 (*) 70 - 99 mg/dL    Comment 1 Documented in Chart      Comment 2 Notify RN     PROTIME-INR     Status: Abnormal   Collection Time   04/06/12  3:28 AM      Component Value Range Comment   Prothrombin Time 22.9 (*) 11.6 - 15.2 seconds    INR 1.99 (*) 0.00 - 1.49   GLUCOSE, CAPILLARY     Status: Abnormal   Collection Time   04/06/12  7:28 AM      Component Value Range Comment   Glucose-Capillary 290 (*) 70 - 99 mg/dL    Comment 1 Documented in Chart      Comment 2 Notify RN     CBC     Status: Normal   Collection Time   04/06/12  8:35 AM      Component Value Range Comment   WBC 8.3  4.0 - 10.5 K/uL    RBC 4.87  4.22 - 5.81 MIL/uL    Hemoglobin 14.8  13.0 - 17.0 g/dL    HCT 40.9  81.1 - 91.4 %    MCV 90.3  78.0 - 100.0 fL    MCH 30.4  26.0 - 34.0 pg    MCHC 33.6  30.0 - 36.0 g/dL    RDW 78.2  95.6 - 21.3 %    Platelets 157  150 - 400 K/uL   BASIC METABOLIC PANEL     Status: Abnormal   Collection Time   04/06/12  8:35 AM      Component Value Range Comment   Sodium 129 (*) 135 - 145 mEq/L    Potassium 3.9  3.5 -  5.1 mEq/L    Chloride 84 (*) 96 - 112 mEq/L    CO2 36 (*) 19 - 32 mEq/L    Glucose, Bld 301 (*) 70 - 99 mg/dL    BUN 27 (*) 6 - 23 mg/dL    Creatinine,  Ser 4.09  0.50 - 1.35 mg/dL    Calcium 9.1  8.4 - 81.1 mg/dL    GFR calc non Af Amer >90  >90 mL/min    GFR calc Af Amer >90  >90 mL/min   PRO B NATRIURETIC PEPTIDE     Status: Abnormal   Collection Time   04/06/12  8:35 AM      Component Value Range Comment   Pro B Natriuretic peptide (BNP) 2624.0 (*) 0 - 450 pg/mL   GLUCOSE, CAPILLARY     Status: Abnormal   Collection Time   04/06/12 12:00 PM      Component Value Range Comment   Glucose-Capillary 440 (*) 70 - 99 mg/dL    Comment 1 Documented in Chart      Comment 2 Notify RN       No results found.  ROS: Chronic cough secondary to severe COPD. Blood pressure 129/81, pulse 50, temperature 98.6 F (37 C), temperature source Core (Comment), resp. rate 29, height 5\' 9"  (1.753 m), weight 152 lb 1.9 oz (69 kg), SpO2 97.00%. The patient appears to be in moderate chronic respiratory distress.  Head and neck exam reveals that the pupils are equal and reactive.  The extraocular movements are full.  There is no scleral icterus.  Mouth and pharynx are benign.  No lymphadenopathy.  No carotid bruits.  The jugular venous pressure is normal.  Thyroid is not enlarged or tender.  Chest reveals bilateral coarse rhonchi and mild wheezing.  Heart reveals no abnormal lift or heave.  First and second heart sounds are normal.  There is no murmur gallop rub or click. Rate rapid and irregular.  The abdomen is soft and nontender.  Bowel sounds are normoactive.  There is no hepatosplenomegaly or mass.  There are no abdominal bruits.  Extremities reveal no phlebitis or edema.  Pedal pulses are equivocal. Feet are cool to the touch.  There is no cyanosis or clubbing.  Neurologic exam is normal strength and no lateralizing weakness.  No sensory deficits.  Integument reveals no rash  Telemetry reviewed.  Long run of VT earlier this afternoon.  Assessment/Plan: 1. End stage COPD with exacerbation 2. Chronic atrial fib with poorly controlled VR  3. CAD  --enzymes negative.  Rec: agree with plans for palliative care. To assist in transition to oral meds for control of his rapid rate I will add oral amiodarone in low dose initially. His LFTs are normal. With his depressed EF 40-45% amiodarone may be better tolerated than pushing his diltiazem higher. Discussed with patient.  He does not recall ever receiving amiodarone and I find no reference to allergy to amio in chart. Will follow with you.  Cassell Clement 04/06/2012, 4:02 PM

## 2012-04-06 NOTE — Progress Notes (Signed)
ANTICOAGULATION CONSULT NOTE - Follow Up Consult  Pharmacy Consult for Coumadin Indication: atrial fibrillation  No Known Allergies  Patient Measurements: Height: 5\' 9"  (175.3 cm) Weight: 152 lb 1.9 oz (69 kg) IBW/kg (Calculated) : 70.7    Vital Signs: Temp: 97.9 F (36.6 C) (07/22 0400) Temp src: Core (Comment) (07/22 0400) BP: 112/41 mmHg (07/22 0400) Pulse Rate: 127  (07/22 0400)  Labs:  Basename 04/06/12 0328 04/05/12 0335 04/04/12 1010  HGB -- 14.5 15.4  HCT -- 44.7 46.5  PLT -- 151 186  APTT -- -- 39*  LABPROT 22.9* 30.1* 28.2*  INR 1.99* 2.82* 2.59*  HEPARINUNFRC -- -- --  CREATININE -- 0.59 0.69  CKTOTAL -- -- --  CKMB -- -- --  TROPONINI -- <0.30 <0.30    Estimated Creatinine Clearance: 70.7 ml/min (by C-G formula based on Cr of 0.59).   Medications:  Scheduled:    . antiseptic oral rinse  15 mL Mouth Rinse q12n4p  . atorvastatin  10 mg Oral q1800  . ceFEPime (MAXIPIME) IV  1 g Intravenous Q12H  . chlorhexidine  15 mL Mouth Rinse BID  . Chlorhexidine Gluconate Cloth  6 each Topical Q0600  . clopidogrel  75 mg Oral Q breakfast  . digoxin  0.25 mg Oral Daily  . diltiazem  15 mg Intravenous Once  . feeding supplement  237 mL Oral BID BM  . furosemide  40 mg Intravenous Daily  . insulin aspart  0-15 Units Subcutaneous TID WC  . insulin glargine  10 Units Subcutaneous Q breakfast  . ipratropium  0.5 mg Nebulization Q6H  . levalbuterol  0.63 mg Nebulization Q6H  . methylPREDNISolone (SOLU-MEDROL) injection  125 mg Intravenous Q6H  . metoprolol  5 mg Intravenous Once  . metoprolol  5 mg Intravenous Q8H  . morphine  15 mg Oral Daily  . mupirocin ointment  1 application Nasal BID  . potassium gluconate  595 mg Oral TID  . senna-docusate  1 tablet Oral QHS  . sodium chloride  3 mL Intravenous Q12H  . spironolactone  50 mg Oral Daily  . warfarin  2.5 mg Oral ONCE-1800  . Warfarin - Pharmacist Dosing Inpatient   Does not apply q1800  . DISCONTD: insulin  glargine  10 Units Subcutaneous BH-q7a  . DISCONTD: metoprolol  5 mg Intravenous Q12H  . DISCONTD: nitroGLYCERIN  0.4 mg Transdermal Daily  . DISCONTD: Warfarin - Physician Dosing Inpatient   Does not apply q1800   Infusions:    . 0.9 % NaCl with KCl 20 mEq / L 10 mL (04/04/12 1628)  . diltiazem (CARDIZEM) infusion 15 mg/hr (04/06/12 0511)    Assessment:  83 YOM on chronic coumadin for Afib, admitted with acute-on-chronic respiratory failure- multifactorial due to COPD exacerbation, Afib with RVR and CHF  Currently in Afib on cardizem drip, digoxin & metoprolol  Dose per [place] clinic notes is 5mg  daily except 2.5mg  Mon and Fri  Dose not documented as given 7/20, dose was not ordered 7/21  INR essentially therapeutic this am (1.99)  Since has not had coumadin in 2 days, will boost dose today  Noted that MD considering amiodarone, will need to monitor for long-term interaction with coumadin (increased INR)  Goal of Therapy:  INR 2-3 Monitor platelets by anticoagulation protocol: Yes   Plan:   Boost coumadin 5mg  po today  Daily PT/INR  Loralee Pacas, PharmD, BCPS Pager: [phone] 04/06/2012,8:51 AM

## 2012-04-06 NOTE — Progress Notes (Signed)
Subjective: Feels better than on admission.  "I thought I was on the way out".  Still very fatigued, short of breath.  Nonproductive cough.  No pain.  States "I've lived a good life.  I'm ready to go".  Objective: Vital signs in last 24 hours: Temp:  [97.9 F (36.6 C)-99.1 F (37.3 C)] 97.9 F (36.6 C) (07/22 0400) Pulse Rate:  [31-127] 127  (07/22 0400) Resp:  [12-32] 12  (07/22 0400) BP: (92-138)/(40-87) 112/41 mmHg (07/22 0400) SpO2:  [89 %-100 %] 97 % (07/22 0400) FiO2 (%):  [50 %] 50 % (07/22 0400) Weight change:     CBG (last 3)   Basename 04/05/12 2132 04/05/12 1608 04/05/12 1159  GLUCAP 417* 281* 239*    Intake/Output from previous day: 07/21 0701 - 07/22 0700 In: 120 [I.V.:120] Out: 1400 [Urine:1400] Intake/Output this shift:    General appearance: alert and mild respiratory distress Eyes: no scleral icterus Throat: oropharynx moist without erythema Resp: bilateral wheezing and rhonchi Cardio: irregularly irregular rhythm and tachycardic GI: soft, non-tender; bowel sounds normal; no masses,  no organomegaly Extremities: no clubbing, cyanosis or edema; cool, atrophy bilateral legs   Lab Results:  Basename 04/05/12 0335 04/04/12 1010  NA 138 132*  K 3.2* 3.6  CL 93* 86*  CO2 38* 34*  GLUCOSE 228* 399*  BUN 21 19  CREATININE 0.59 0.69  CALCIUM 9.2 9.4  MG -- 2.0  PHOS -- --    Basename 04/05/12 0335 04/04/12 1010  AST 11 13  ALT 19 29  ALKPHOS 84 112  BILITOT 0.4 0.6  PROT 6.1 6.7  ALBUMIN 2.5* 3.3*    Basename 04/05/12 0335 04/04/12 1010  WBC 6.3 7.0  NEUTROABS -- 6.0  HGB 14.5 15.4  HCT 44.7 46.5  MCV 92.0 92.1  PLT 151 186   Lab Results  Component Value Date   INR 1.99* 04/06/2012   INR 2.82* 04/05/2012   INR 2.59* 04/04/2012    Basename 04/05/12 0335 04/04/12 1010  CKTOTAL -- --  CKMB -- --  CKMBINDEX -- --  TROPONINI <0.30 <0.30   Urine Culture >100K E.coli Sputum culture normal flora Blood cultures  pending   Studies/Results: Dg Chest Portable 1 View  04/04/2012  *RADIOLOGY REPORT*  Clinical Data: Shortness of breath.  Respiratory distress.  PORTABLE CHEST - 1 VIEW  Comparison: Chest x-ray 03/10/2012.  Findings: Blunting of the right costophrenic sulcus suggesting a small right-sided pleural effusion.  No definite consolidative airspace disease.  Retrocardiac opacity favored to represent subsegmental atelectasis.  Pulmonary vasculature and the cardiomediastinal silhouette are within normal limits. Atherosclerosis in the thoracic aorta.  Left-sided pacemaker device in place with lead tip projecting over the expected location of the right ventricular apex.  IMPRESSION: 1.  Small right-sided pleural effusion. 2.  Subsegmental atelectasis in the left lower lobe. 3.  Atherosclerosis.  Original Report Authenticated By: Florencia Reasons, M.D.     Medications: Scheduled:   . antiseptic oral rinse  15 mL Mouth Rinse q12n4p  . atorvastatin  10 mg Oral q1800  . ceFEPime (MAXIPIME) IV  1 g Intravenous Q12H  . chlorhexidine  15 mL Mouth Rinse BID  . Chlorhexidine Gluconate Cloth  6 each Topical Q0600  . clopidogrel  75 mg Oral Q breakfast  . digoxin  0.25 mg Oral Daily  . diltiazem  15 mg Intravenous Once  . feeding supplement  237 mL Oral BID BM  . furosemide  40 mg Intravenous Daily  . insulin aspart  0-15 Units Subcutaneous TID WC  . methylPREDNISolone (SOLU-MEDROL) injection  125 mg Intravenous Q6H  . metoprolol  5 mg Intravenous Once  . metoprolol  5 mg Intravenous Q8H  . morphine  15 mg Oral Daily  . mupirocin ointment  1 application Nasal BID  . potassium gluconate  595 mg Oral TID  . senna-docusate  1 tablet Oral QHS  . sodium chloride  3 mL Intravenous Q12H  . spironolactone  50 mg Oral Daily  . warfarin  2.5 mg Oral ONCE-1800  . Warfarin - Pharmacist Dosing Inpatient   Does not apply q1800  . DISCONTD: metoprolol  5 mg Intravenous Q12H  . DISCONTD: nitroGLYCERIN  0.4 mg  Transdermal Daily  . DISCONTD: Warfarin - Physician Dosing Inpatient   Does not apply q1800   Continuous:   . 0.9 % NaCl with KCl 20 mEq / L 10 mL (04/04/12 1628)  . diltiazem (CARDIZEM) infusion 15 mg/hr (04/06/12 0511)    Assessment/Plan: Principal Problem: 1. *Acute-on-chronic respiratory failure- multifactorial due to COPD exacerbation, Afib with RVR and CHF.  Slight improvement with steroids.  Will change nebs to Xopenex/Atrovent to q6 hours scheduled.  Continue broad spectrum antibiotics to cover healthcare acquired organisms.  Wean O2 as tolerated.    Active Problems: 2. COPD exacerbation- continue treatment as aboveCHF (congestive heart failure)- appears euvolemic.  Elevated BNP related to tachycardia and chronic heart failure.   3. Permanent atrial fibrillation with RVR- secondary to pulmonary disease.  Continue Cardizem drip, Lopressor and Digoxin.  Consider Amiodarone (will review records to see if there has been an issue in the past). 4. CAD (coronary artery disease)- cardiac enzymes negative.  Continue medical therapy. 5. Type 2 diabetes mellitus- add Lantus 10 units and continue SSI. 6. Chronic anticoagulation- coumadin per pharmacy 7. Ethics- I discussed goals of care with patient.  He has been hospitalized at least 4 times since 11/12 for similar issues and has not had significant recovery in function between episodes.  He desires palliative care.  Will request Hospice consult to assist with transition in goals of care in an effort to maximize quality of life and reduce rehospitalization.     LOS: 2 days   [name],[name] 04/06/2012, 7:25 AM

## 2012-04-06 NOTE — Progress Notes (Addendum)
Patient [name]      DOB: 1928-02-07      ZOX:[id number]  Patient 78 yo WM admitted from SNF on 04/04/12 with c/o increased SOB, has past medical history of COPD, CAD, CHF, A-fib (on Coumadin), cardiac pacemaker. Recently admitted at [facility] from [facility] on 03/10/12 with CP/SOB, discharged 03/19/12.  PMT consult ordered by Dr [name], grand-daughter Anson Crofts in room. Contacted patients daughter [name] (C# [phone]), HPOA. PMT meeting set up for 04/07/12 at 3pm.  Given PMT phone contact information. Freddie Breech, CNS-C Palliative Medicine Team [facility] Team Phone: [phone] Pager: [phone]

## 2012-04-06 NOTE — Progress Notes (Signed)
Notified Dr. [name] of patients blood sugar; ordered to give Lantus BID and give 16 units novlog now; will continue monitor blood sugars

## 2012-04-06 NOTE — Progress Notes (Signed)
Palliative Medicine Team SW Pt is familiar to me from a previous admission, at which time he had depressive and anxiety sx. Pt reports good spirits; "I'm not angry like last time". Pt having some SOB and reports his perception that he is "worse off than last time". Pt requesting breathing tx or puff from inhaler, alerted RN. Pt visited by granddaughter as I was leaving as well as PMT NP who is here to schedule GOC. Will continue to follow for emotional support.   Kennieth Francois, [place] Pager [phone]

## 2012-04-06 NOTE — Clinical Documentation Improvement (Signed)
CHF DOCUMENTATION CLARIFICATION QUERY  THIS DOCUMENT IS NOT A PERMANENT PART OF THE MEDICAL RECORD  TO RESPOND TO THE THIS QUERY, FOLLOW THE INSTRUCTIONS BELOW:  1. If needed, update documentation for the patient's encounter via the notes activity.  2. Access this query again and click edit on the In [employer].  3. After updating, or not, click F2 to complete all highlighted (required) fields concerning your review. Select "additional documentation in the medical record" OR "no additional documentation provided".  4. Click Sign note button.  5. The deficiency will fall out of your In Basket *Please let us know if you are not able to complete this workflow by phone or e-mail (listed below).  Please update your documentation within the medical record to reflect your response to this query.                                                                                    04/06/12  Dear Lavada Mesi, [facility],  In a better effort to capture your patient's severity of illness, reflect appropriate length of stay and utilization of resources, a review of the patient medical record has revealed the following indicators the diagnosis of Heart Failure.    Based on your clinical judgment, please clarify and document in a progress note and/or discharge summary the clinical condition associated with the following supporting information:  In responding to this query please exercise your independent judgment.  The fact that a query is asked, does not imply that any particular answer is desired or expected.  Pt admitted with COPD exacerbation and UTI  According to HP pt with chronic systolic CHF    Please clarify if chronic systolic CHF in setting of elevated BNP range from 5095 to 2624 necessitating the treatment of Lasix, Cardizem, and digoxin can qualify for a more acute condition represented by the diagnoses listed below and document in the pn  Or d/c summary.  Thank you for all that you do  for our patients!   Possible Clinical Conditions?   Acute Systolic Congestive Heart Failure Acute Diastolic Congestive Heart Failure Acute Systolic & Diastolic Congestive Heart Failure Acute on Chronic Systolic Congestive Heart Failure Acute on Chronic Diastolic Congestive Heart Failure Acute on Chronic Systolic & Diastolic  Congestive Heart Failure Other Condition________________________________________ Cannot Clinically Determine  Supporting Information:  Risk Factors: COPD exacerbation/CAD/Chronic systolic CHF, H/O AFIB RVR/ischemic cardiomyopathy with left ventricular ejection fraction of 35%,hypercholesteremia,HLD, AAA, Angina, cardiac pacemaker  Signs & Symptoms:  Diagnostics: Component      Pro B Natriuretic peptide (BNP)  Latest Ref Rng      0 - 450 pg/mL  04/04/2012     12:49 PM 5095.0 (H)   Component      Pro B Natriuretic peptide (BNP)  Latest Ref Rng      0 - 450 pg/mL  04/06/2012     8:35 AM 2624.0 (H)   Treatment: levalbuterol (XOPENEX) ipratropium (ATROVENT)  diltiazem (CARDIZEM) 100 mg   furosemide (LASIX)  digoxin (LANOXIN) tablet 0.25 mg     Reviewed: additional documentation in the medical record  Thank You,  Enis Slipper  RN, BSN, MSN/INF, CCDS Clinical Documentation  Specialist [facility] Dept Pager: [phone] / E-mail: [email or web address]  Health Information Management [place]

## 2012-04-06 NOTE — Clinical Social Work Note (Signed)
CSW reviewed case and discussed Pt on rounds. CSW aware Pt from a SNF, but also has a GOC pending. CSW out of time to assess today and will follow/assess in the am.   [name], [place] Clinical Social Worker [facility] Cell [phone]'

## 2012-04-06 NOTE — Progress Notes (Signed)
CARE MANAGEMENT NOTE 04/06/2012  Patient:  [name], [name]   Account Number:  [id number]  Date Initiated:  04/06/2012  Documentation initiated by:  DAVIS,RHONDA  Subjective/Objective Assessment:   patient with readmission for acute resp failure admitted and placed on bipap     Action/Plan:   pt has now decided sto have a palliative care meeting to discuss his options is a dnr   Anticipated DC Date:  04/07/2012   Anticipated DC Plan:  [facility]  In-house referral  Clinical Social Worker  Hospice / Palliative Care      DC Planning Services  CM consult      [facility] Choice  NA   Choice offered to / List presented to:  NA   DME arranged  NA      DME agency  NA     HH arranged  NA      HH agency  NA   Status of service:  In process, will continue to follow Medicare Important Message given?  NA - LOS <3 / Initial given by admissions (If response is "NO", the following Medicare IM given date fields will be blank) Date Medicare IM given:   Date Additional Medicare IM given:    Discharge Disposition:    Per UR Regulation:  Reviewed for med. necessity/level of care/duration of stay  If discussed at Long Length of Stay Meetings, dates discussed:    Comments:  [id number]/Rhonda Earlene Plater, RN, BSN, CCM: CHART REVIEWED AND UPDATED. NO DISCHARGE NEEDS PRESENT AT THIS TIME. CASE MANAGEMENT

## 2012-04-07 DIAGNOSIS — J9600 Acute respiratory failure, unspecified whether with hypoxia or hypercapnia: Secondary | ICD-10-CM

## 2012-04-07 LAB — GLUCOSE, CAPILLARY
Glucose-Capillary: 234 mg/dL — ABNORMAL HIGH (ref 70–99)
Glucose-Capillary: 369 mg/dL — ABNORMAL HIGH (ref 70–99)

## 2012-04-07 LAB — CBC
Hemoglobin: 13.9 g/dL (ref 13.0–17.0)
MCH: 30.2 pg (ref 26.0–34.0)
Platelets: 142 10*3/uL — ABNORMAL LOW (ref 150–400)
RBC: 4.61 MIL/uL (ref 4.22–5.81)
WBC: 8.2 10*3/uL (ref 4.0–10.5)

## 2012-04-07 LAB — CULTURE, RESPIRATORY W GRAM STAIN: Gram Stain: NONE SEEN

## 2012-04-07 LAB — BASIC METABOLIC PANEL
CO2: 38 mEq/L — ABNORMAL HIGH (ref 19–32)
Calcium: 8.6 mg/dL (ref 8.4–10.5)
Chloride: 86 mEq/L — ABNORMAL LOW (ref 96–112)
Glucose, Bld: 203 mg/dL — ABNORMAL HIGH (ref 70–99)
Potassium: 3.5 mEq/L (ref 3.5–5.1)
Sodium: 129 mEq/L — ABNORMAL LOW (ref 135–145)

## 2012-04-07 LAB — URINE CULTURE

## 2012-04-07 LAB — PROTIME-INR: Prothrombin Time: 25.5 seconds — ABNORMAL HIGH (ref 11.6–15.2)

## 2012-04-07 LAB — PRO B NATRIURETIC PEPTIDE: Pro B Natriuretic peptide (BNP): 2108 pg/mL — ABNORMAL HIGH (ref 0–450)

## 2012-04-07 MED ORDER — INSULIN GLARGINE 100 UNIT/ML ~~LOC~~ SOLN
15.0000 [IU] | Freq: Two times a day (BID) | SUBCUTANEOUS | Status: DC
  Administered 2012-04-07: 15 [IU] via SUBCUTANEOUS

## 2012-04-07 MED ORDER — PREDNISONE 50 MG PO TABS
60.0000 mg | ORAL_TABLET | Freq: Every day | ORAL | Status: DC
  Administered 2012-04-07 – 2012-04-09 (×3): 60 mg via ORAL
  Filled 2012-04-07 (×4): qty 1

## 2012-04-07 MED ORDER — INSULIN GLARGINE 100 UNIT/ML ~~LOC~~ SOLN
12.0000 [IU] | Freq: Two times a day (BID) | SUBCUTANEOUS | Status: DC
  Administered 2012-04-07: 12 [IU] via SUBCUTANEOUS

## 2012-04-07 MED ORDER — BUDESONIDE 0.5 MG/2ML IN SUSP
0.5000 mg | Freq: Two times a day (BID) | RESPIRATORY_TRACT | Status: DC
  Administered 2012-04-07 – 2012-04-14 (×15): 0.5 mg via RESPIRATORY_TRACT
  Filled 2012-04-07 (×17): qty 2

## 2012-04-07 MED ORDER — WARFARIN SODIUM 2.5 MG PO TABS
2.5000 mg | ORAL_TABLET | Freq: Once | ORAL | Status: AC
  Administered 2012-04-07: 2.5 mg via ORAL
  Filled 2012-04-07: qty 1

## 2012-04-07 MED ORDER — MORPHINE SULFATE (CONCENTRATE) 20 MG/ML PO SOLN
2.5000 mg | Freq: Four times a day (QID) | ORAL | Status: DC | PRN
  Administered 2012-04-09: 20 mg via SUBLINGUAL
  Administered 2012-04-10 – 2012-04-12 (×4): 2.6 mg via SUBLINGUAL
  Filled 2012-04-07 (×5): qty 1

## 2012-04-07 MED ORDER — GLUCERNA SHAKE PO LIQD
237.0000 mL | Freq: Two times a day (BID) | ORAL | Status: DC
  Administered 2012-04-07: 237 mL via ORAL
  Filled 2012-04-07: qty 237

## 2012-04-07 MED ORDER — BISACODYL 10 MG RE SUPP: 10.0000 mg | Freq: Every day | RECTAL | Status: DC | PRN

## 2012-04-07 MED ORDER — SODIUM CHLORIDE 0.9 % IV SOLN
500.0000 mg | Freq: Four times a day (QID) | INTRAVENOUS | Status: AC
  Administered 2012-04-07 – 2012-04-13 (×26): 500 mg via INTRAVENOUS
  Filled 2012-04-07 (×28): qty 500

## 2012-04-07 MED ORDER — GLUCERNA SHAKE PO LIQD
237.0000 mL | Freq: Two times a day (BID) | ORAL | Status: DC
  Administered 2012-04-08 – 2012-04-14 (×13): 237 mL via ORAL
  Filled 2012-04-07 (×16): qty 237

## 2012-04-07 NOTE — Consult Note (Signed)
Patient [name]      DOB: [birth year]      JXB:[id number]     Consult Note from the Palliative Medicine Team at [facility]    Consult Requested by: Dr Buren Kos     PCP: Kari Baars, MD Reason for Consultation:Goals of Care     Phone Number:[phone]  Assessment of patients Current state: Patient is alert/oriented, does admit to moderate to severe DOE. Prior to this admission patient was living in independent living facility with limited assist.   Goals of Care: 1.  Code Status: DNR/DNI   2. Scope of Treatment: ( reviewed/completed Medical Orders for Scope of Treatment form with patient) 1. Respiratory/Oxygen: Would like to continue to be brought to hospital for acute events  2. IV Hydration/Nutritional Support/Tube Feeds: Does want to continue IVF hydration for defined period if needed, but would not want feeding tube  3. Antibiotics: Limit use of antibiotics to treat identified infection 4. Level of Medical Intervention: At this time would want to be brought back to ER for acute events   4. Disposition: Plans per daughter and patient would like to return to to independent living, but realizes he will need rehabilitation to regain strength. Would be agreeable to have Palliative Care Services follow at SNF upon discharge. Informed them that if condition declined patient could opt for hospice services at any time.    3. Symptom Management:    1) Dyspnea: per pt/daughter becomes weak/lethargic ("knocks him down"), has used 15 mg dose daily and 7.5 mg twice daily dosing.  Per patient does help with DOE. Ordered  smaller doses in oral sublingual form Roxinol (20 mg/ml) order 2.5 mg every 6 hours as needed for dyspnea. Patient educated to ask for it as needed. Dr [name] made ware of this change.  2) Bowel Regime: On Senna/Docosate daily, which per patient is effective. General bowel pattern is every 2-3 days. Will order as need Ducolax suppository.  4.  Psychosocial:Emotional support to daughter and patient. Patient feels that he "is ready to die, but would like to have acute p  5. Spiritual:Chaplin services consulted   Patient Documents Completed or Given: Document Given Completed  Advanced Directives Pkt    MOST  YES  DNR  YES  Gone from My Sight    Hard Choices  YES    Brief HPI: Patient is an 78 yo WM with history of COPD, CAD, CHF, A-fib s/p pacer that was admitted 04/04/12 with cough and dyspnea. Initially required BIPAP. Admitted and treated for COPD exacerbation and atrial fibrillation with RVR   ROS: + dyspnea, denies pain, n/v, constipation  PMH:  Past Medical History  Diagnosis Date  . Pneumonia 12/06/10    healthcare-associated/ left lower lobe  . COPD (chronic obstructive pulmonary disease)     severe stage IV  . Atrial fibrillation     on Coumadin with St. Jude single-chamber pacemaker  . Diabetes mellitus     type 2; s/p Prednisone therapy for pneumonia 12/2010  . Coronary artery disease     s/p anterior STEMI 09/2009; cath. revealed mid LAD 40%, distal LAD 100%- PTCA, prox. RCA 40%, mid. RCA 100% with good collateral filling of PDA; EF 25%; NSTEMI 07/2011 - PCI/DES 100% LCX  . ST elevation (STEMI) myocardial infarction 01//11/12    anterior  . CHF (congestive heart failure)     EF 25% on 09/26/10; EF 50-55% and grade 1 diastolic dysfunction on ECHO 08/2010   . Hypertension   .  Hyperlipidemia   . AAA (abdominal aortic aneurysm)     3 cm infrarenal abdominal aortic aneurysm per aorta ultrasound 03/2010  . Osteoarthritis     s/p bilat hip arthroplasty  . Angina   . Ischemic cardiomyopathy     EF 35% LHC 11/12  . GERD (gastroesophageal reflux disease)   . Hypercholesteremia   . PVD (peripheral vascular disease)   . Anxiety      PSH: Past Surgical History  Procedure Date  . Hip arthroplasty     s/p right 09/27/10 and s/p left 09/24/10  . Back surgery   . Knee surgery   . Pacemaker insertion 12/2010     St. Jude single-chamber   . Cardiac catheterization 09/2009, 06/2007    PTCA to distal LAD   I have reviewed the FH and SH and  If appropriate update it with new information. No Known Allergies Scheduled Meds:   . amiodarone  200 mg Oral BID  . antiseptic oral rinse  15 mL Mouth Rinse q12n4p  . atorvastatin  10 mg Oral q1800  . budesonide  0.5 mg Nebulization BID  . chlorhexidine  15 mL Mouth Rinse BID  . Chlorhexidine Gluconate Cloth  6 each Topical Q0600  . clopidogrel  75 mg Oral Q breakfast  . digoxin  0.25 mg Oral Daily  . feeding supplement  237 mL Oral BID BM  . furosemide  40 mg Intravenous Daily  . imipenem-cilastatin  500 mg Intravenous Q6H  . insulin aspart  0-15 Units Subcutaneous TID WC  . insulin aspart  16 Units Subcutaneous Once  . insulin aspart  18 Units Subcutaneous Once  . insulin glargine  12 Units Subcutaneous BID  . ipratropium  0.5 mg Nebulization Q6H  . levalbuterol  0.63 mg Nebulization Q6H  . metoprolol  5 mg Intravenous Q8H  . morphine  15 mg Oral Daily  . mupirocin ointment  1 application Nasal BID  . potassium gluconate  595 mg Oral TID  . predniSONE  60 mg Oral QAC breakfast  . senna-docusate  1 tablet Oral QHS  . sodium chloride  3 mL Intravenous Q12H  . spironolactone  50 mg Oral Daily  . warfarin  2.5 mg Oral ONCE-1800  . warfarin  5 mg Oral ONCE-1800  . Warfarin - Pharmacist Dosing Inpatient   Does not apply q1800  . DISCONTD: ceFEPime (MAXIPIME) IV  1 g Intravenous Q12H  . DISCONTD: insulin glargine  10 Units Subcutaneous Q breakfast  . DISCONTD: insulin glargine  10 Units Subcutaneous BID  . DISCONTD: methylPREDNISolone (SOLU-MEDROL) injection  125 mg Intravenous Q6H  . DISCONTD: methylPREDNISolone (SOLU-MEDROL) injection  80 mg Intravenous Q6H   Continuous Infusions:   . 0.9 % NaCl with KCl 20 mEq / L 10 mL (04/04/12 1628)  . diltiazem (CARDIZEM) infusion 20 mg/hr (04/07/12 1011)   PRN Meds:.acetaminophen, acetaminophen, alum &  mag hydroxide-simeth, morphine injection, nitroGLYCERIN, ondansetron (ZOFRAN) IV, ondansetron, oxyCODONE, zolpidem  BP 135/54  Pulse 121  Temp 98.1 F (36.7 C) (Core (Comment))  Resp 29  Ht 5\' 9"  (1.753 m)  Wt 69.8 kg (153 lb 14.1 oz)  BMI 22.72 kg/m2  SpO2 96%   PPS: 50%   Intake/Output Summary (Last 24 hours) at 04/07/12 1044 Last data filed at 04/07/12 1011  Gross per 24 hour  Intake 1430.25 ml  Output   1325 ml  Net 105.25 ml   LBM: 04/07/12  Stool Softner: Yes  Physical Exam:  General: Alert/oriented, mild dyspnea at rest HEENT:  Anicteric, mouth moist Chest:   Scattered rhonchi, inspirational wheezes CVS: tachy, rhythm irregular Abdomen:soft, non-tender, audible bowel sounds Ext: no edema or mottling  Labs: CBC    Component Value Date/Time   WBC 8.2 04/07/2012 0310   RBC 4.61 04/07/2012 0310   HGB 13.9 04/07/2012 0310   HCT 40.5 04/07/2012 0310   PLT 142* 04/07/2012 0310   MCV 87.9 04/07/2012 0310   MCH 30.2 04/07/2012 0310   MCHC 34.3 04/07/2012 0310   RDW 12.4 04/07/2012 0310   LYMPHSABS 0.3* 04/04/2012 1010   MONOABS 0.7 04/04/2012 1010   EOSABS 0.0 04/04/2012 1010   BASOSABS 0.0 04/04/2012 1010    BMET    Component Value Date/Time   NA 129* 04/07/2012 0310   K 3.5 04/07/2012 0310   CL 86* 04/07/2012 0310   CO2 38* 04/07/2012 0310   GLUCOSE 203* 04/07/2012 0310   BUN 30* 04/07/2012 0310   CREATININE 0.69 04/07/2012 0310   CALCIUM 8.6 04/07/2012 0310   GFRNONAA 87* 04/07/2012 0310   GFRAA >90 04/07/2012 0310    CMP     Component Value Date/Time   NA 129* 04/07/2012 0310   K 3.5 04/07/2012 0310   CL 86* 04/07/2012 0310   CO2 38* 04/07/2012 0310   GLUCOSE 203* 04/07/2012 0310   BUN 30* 04/07/2012 0310   CREATININE 0.69 04/07/2012 0310   CALCIUM 8.6 04/07/2012 0310   PROT 6.1 04/05/2012 0335   ALBUMIN 2.5* 04/05/2012 0335   AST 11 04/05/2012 0335   ALT 19 04/05/2012 0335   ALKPHOS 84 04/05/2012 0335   BILITOT 0.4 04/05/2012 0335   GFRNONAA 87*  04/07/2012 0310   GFRAA >90 04/07/2012 0310    Chest Xray Reviewed/Impressions:04/04/12  IMPRESSION:  1. Small right-sided pleural effusion.  2. Subsegmental atelectasis in the left lower lobe.  3. Atherosclerosis.   Time In Time Out Total Time Spent with Patient Total Overall Time  9:30a 11:00a 60 min 90 min    Greater than 50%  of this time was spent counseling and coordinating care related to the above assessment and plan.  Freddie Breech, CNS-C Palliative Medicine Team [facility] Health Team Phone: [phone] Pager: [phone]

## 2012-04-07 NOTE — Consult Note (Signed)
I have reviewed and discussed the care of this patient in detail with the nurse practitioner including pertinent patient records, physical exam findings and data. I agree with details of this encounter.  

## 2012-04-07 NOTE — Progress Notes (Signed)
Lab results from urine culture positive for ESBL. Called to Darin Fraction NP

## 2012-04-07 NOTE — Progress Notes (Signed)
Subjective: He is feeling a little better.  Decreased shortness of breath.  Discussed his goals with his daughter.    Objective: Vital signs in last 24 hours: Temp:  [97.3 F (36.3 C)-98.6 F (37 C)] 97.3 F (36.3 C) (07/23 0600) Pulse Rate:  [34-135] 58  (07/23 0600) Resp:  [16-30] 25  (07/23 0600) BP: (97-138)/(45-81) 97/68 mmHg (07/23 0600) SpO2:  [94 %-100 %] 97 % (07/23 0600) FiO2 (%):  [50 %] 50 % (07/22 1354) Weight:  [69.8 kg (153 lb 14.1 oz)] 69.8 kg (153 lb 14.1 oz) (07/23 0600) Weight change:  Last BM Date:  (since before hospitalization)  CBG (last 3)   Basename 04/06/12 2108 04/06/12 1724 04/06/12 1200  GLUCAP 276* 434* 440*    Intake/Output from previous day: 07/22 0701 - 07/23 0700 In: 1313.3 [P.O.:540; I.V.:673.3; IV Piggyback:100] Out: 1690 [Urine:1690] Intake/Output this shift:    General appearance: alert and mild tachypnea, improved Eyes: no scleral icterus Throat: oropharynx moist without erythema Resp: slightly decreased breath sounds but no wheezing or rhonchi Cardio: irregularly irregular rhythm and tachycardic GI: soft, non-tender; bowel sounds normal; no masses,  no organomegaly Extremities: no clubbing, cyanosis or edema; extremities cool   Lab Results:  Basename 04/07/12 0310 04/06/12 0835 04/04/12 1010  NA 129* 129* --  K 3.5 3.9 --  CL 86* 84* --  CO2 38* 36* --  GLUCOSE 203* 301* --  BUN 30* 27* --  CREATININE 0.69 0.61 --  CALCIUM 8.6 9.1 --  MG -- -- 2.0  PHOS -- -- --    Basename 04/05/12 0335 04/04/12 1010  AST 11 13  ALT 19 29  ALKPHOS 84 112  BILITOT 0.4 0.6  PROT 6.1 6.7  ALBUMIN 2.5* 3.3*    Basename 04/07/12 0310 04/06/12 0835 04/04/12 1010  WBC 8.2 8.3 --  NEUTROABS -- -- 6.0  HGB 13.9 14.8 --  HCT 40.5 44.0 --  MCV 87.9 90.3 --  PLT 142* 157 --   Lab Results  Component Value Date   INR 2.28* 04/07/2012   INR 1.99* 04/06/2012   INR 2.82* 04/05/2012    Basename 04/05/12 0335 04/04/12 1010  CKTOTAL --  --  CKMB -- --  CKMBINDEX -- --  TROPONINI <0.30 <0.30   No results found for this basename: TSH,T4TOTAL,FREET3,T3FREE,THYROIDAB in the last 72 hours Urine Culture- ESBL sensitive to Zosyn, Bactrim, Gent and macrodantin  Studies/Results: No results found.   Medications: Scheduled:   . amiodarone  200 mg Oral BID  . antiseptic oral rinse  15 mL Mouth Rinse q12n4p  . atorvastatin  10 mg Oral q1800  . ceFEPime (MAXIPIME) IV  1 g Intravenous Q12H  . chlorhexidine  15 mL Mouth Rinse BID  . Chlorhexidine Gluconate Cloth  6 each Topical Q0600  . clopidogrel  75 mg Oral Q breakfast  . digoxin  0.25 mg Oral Daily  . feeding supplement  237 mL Oral BID BM  . furosemide  40 mg Intravenous Daily  . insulin aspart  0-15 Units Subcutaneous TID WC  . insulin aspart  16 Units Subcutaneous Once  . insulin aspart  18 Units Subcutaneous Once  . insulin glargine  10 Units Subcutaneous BID  . ipratropium  0.5 mg Nebulization Q6H  . levalbuterol  0.63 mg Nebulization Q6H  . methylPREDNISolone (SOLU-MEDROL) injection  80 mg Intravenous Q6H  . metoprolol  5 mg Intravenous Q8H  . morphine  15 mg Oral Daily  . mupirocin ointment  1 application Nasal BID  .  potassium gluconate  595 mg Oral TID  . senna-docusate  1 tablet Oral QHS  . sodium chloride  3 mL Intravenous Q12H  . spironolactone  50 mg Oral Daily  . warfarin  5 mg Oral ONCE-1800  . Warfarin - Pharmacist Dosing Inpatient   Does not apply q1800  . DISCONTD: insulin glargine  10 Units Subcutaneous BH-q7a  . DISCONTD: insulin glargine  10 Units Subcutaneous Q breakfast  . DISCONTD: methylPREDNISolone (SOLU-MEDROL) injection  125 mg Intravenous Q6H   Continuous:   . 0.9 % NaCl with KCl 20 mEq / L 10 mL (04/04/12 1628)  . diltiazem (CARDIZEM) infusion 20 mg/hr (04/07/12 0641)    Assessment/Plan: Principal Problem:  1. *Acute-on-chronic respiratory failure- multifactorial due to COPD exacerbation, Afib with RVR and CHF. Interval  improvement in pulmonary exam.  Will transition to oral steroids.  Continue scheduled nebs. Resume Pulmicort.  Wean O2 as tolerated.  Active Problems:  2. COPD exacerbation- continue treatment as above 3. CHF (congestive heart failure)- EF 40-45%- appears euvolemic. Elevated BNP related to tachycardia and chronic heart failure.  4. Permanent atrial fibrillation with RVR- secondary to pulmonary disease.  Refractory to Cardizem drip and Lopressor IV.  Appreciate cardiology assistance.  Amiodarone added yesterday.  Will check baseline TSH.    5. CAD (coronary artery disease)- cardiac enzymes negative. Continue medical therapy.  6. Type 2 diabetes mellitus-steroid induced hyperglycemia despite addition/increase in Lantus.  Increase to 12 units BID and continue SSI. Should improve with decrease in steroids 7. Chronic anticoagulation- coumadin per pharmacy  8. E.coli UTI- change antibiotics to Zosyn based on urine cultures (to continue respiratory coverage as well). 8. Ethics-  He has been hospitalized at least 4 times since 11/12 for similar issues and has not had significant recovery in function between episodes. He desires palliative care. Will request Hospice consult to assist with transition in goals of care in an effort to maximize quality of life and reduce rehospitalization.  Patient reiterated desire for comfort/dignity.  9. Dispo- anticipate discharge to SNF when stable (anticipate early next week)- PT/OT consults, OOB, CM/SW consults.     LOS: 3 days   Milah Recht,[name] 04/07/2012, 7:27 AM

## 2012-04-07 NOTE — Progress Notes (Signed)
ANTICOAGULATION CONSULT NOTE - Follow Up Consult  Pharmacy Consult for Coumadin Indication: atrial fibrillation  No Known Allergies  Patient Measurements: Height: 5\' 9"  (175.3 cm) Weight: 153 lb 14.1 oz (69.8 kg) IBW/kg (Calculated) : 70.7    Vital Signs: Temp: 97.9 F (36.6 C) (07/23 0800) Temp src: Core (Comment) (07/23 0800) BP: 117/54 mmHg (07/23 0800) Pulse Rate: 117  (07/23 0800)  Labs:  Basename 04/07/12 0310 04/06/12 0835 04/06/12 0328 04/05/12 0335 04/04/12 1010  HGB 13.9 14.8 -- -- --  HCT 40.5 44.0 -- 44.7 --  PLT 142* 157 -- 151 --  APTT -- -- -- -- 39*  LABPROT 25.5* -- 22.9* 30.1* --  INR 2.28* -- 1.99* 2.82* --  HEPARINUNFRC -- -- -- -- --  CREATININE 0.69 0.61 -- 0.59 --  CKTOTAL -- -- -- -- --  CKMB -- -- -- -- --  TROPONINI -- -- -- <0.30 <0.30    Estimated Creatinine Clearance: 71.5 ml/min (by C-G formula based on Cr of 0.69).   Medications:  Scheduled:     . amiodarone  200 mg Oral BID  . antiseptic oral rinse  15 mL Mouth Rinse q12n4p  . atorvastatin  10 mg Oral q1800  . budesonide  0.5 mg Nebulization BID  . chlorhexidine  15 mL Mouth Rinse BID  . Chlorhexidine Gluconate Cloth  6 each Topical Q0600  . clopidogrel  75 mg Oral Q breakfast  . digoxin  0.25 mg Oral Daily  . feeding supplement  237 mL Oral BID BM  . furosemide  40 mg Intravenous Daily  . imipenem-cilastatin  500 mg Intravenous Q6H  . insulin aspart  0-15 Units Subcutaneous TID WC  . insulin aspart  16 Units Subcutaneous Once  . insulin aspart  18 Units Subcutaneous Once  . insulin glargine  12 Units Subcutaneous BID  . ipratropium  0.5 mg Nebulization Q6H  . levalbuterol  0.63 mg Nebulization Q6H  . metoprolol  5 mg Intravenous Q8H  . morphine  15 mg Oral Daily  . mupirocin ointment  1 application Nasal BID  . potassium gluconate  595 mg Oral TID  . predniSONE  60 mg Oral QAC breakfast  . senna-docusate  1 tablet Oral QHS  . sodium chloride  3 mL Intravenous Q12H  .  spironolactone  50 mg Oral Daily  . warfarin  5 mg Oral ONCE-1800  . Warfarin - Pharmacist Dosing Inpatient   Does not apply q1800  . DISCONTD: ceFEPime (MAXIPIME) IV  1 g Intravenous Q12H  . DISCONTD: insulin glargine  10 Units Subcutaneous Q breakfast  . DISCONTD: insulin glargine  10 Units Subcutaneous BID  . DISCONTD: methylPREDNISolone (SOLU-MEDROL) injection  125 mg Intravenous Q6H  . DISCONTD: methylPREDNISolone (SOLU-MEDROL) injection  80 mg Intravenous Q6H   Infusions:     . 0.9 % NaCl with KCl 20 mEq / L 10 mL (04/04/12 1628)  . diltiazem (CARDIZEM) infusion 20 mg/hr (04/07/12 0900)    Assessment:  83 YOM on chronic coumadin for Afib, admitted with acute-on-chronic respiratory failure- multifactorial due to COPD exacerbation, Afib with RVR and CHF  Currently in Afib on cardizem drip, digoxin & metoprolol  Dose per [place] clinic notes is 5mg  daily except 2.5mg  Mon and Fri  Dose not documented as given 7/20, dose was not ordered 7/21, 5mg  give 7/22  INR essentially therapeutic this am (2.28)  Now that amiodarone has started, will need to monitor for long-term interaction with coumadin (increased INR); amio can also result  in elevated digoxin levels (1.2 on 7/20), consider decreasing dig dose by 50% and recheck level at new steady state.  Goal of Therapy:  INR 2-3 Monitor platelets by anticoagulation protocol: Yes   Plan:   Coumadin 2.5mg  po today  Daily PT/INR  Loralee Pacas, PharmD, BCPS Pager: [phone] 04/07/2012,9:18 AM

## 2012-04-07 NOTE — Progress Notes (Signed)
ANTIBIOTIC CONSULT NOTE - INITIAL  Pharmacy Consult for Primaxin Indication: PNA, E.coli UTI, +ESBL  No Known Allergies  Patient Measurements: Height: 5\' 9"  (175.3 cm) Weight: 153 lb 14.1 oz (69.8 kg) IBW/kg (Calculated) : 70.7    Vital Signs: Temp: 97.9 F (36.6 C) (07/23 0800) Temp src: Core (Comment) (07/23 0800) BP: 117/54 mmHg (07/23 0800) Pulse Rate: 117  (07/23 0800) Intake/Output from previous day: 07/22 0701 - 07/23 0700 In: 1313.3 [P.O.:540; I.V.:673.3; IV Piggyback:100] Out: 1690 [Urine:1690] Intake/Output from this shift: Total I/O In: 30 [I.V.:30] Out: 125 [Urine:125]  Labs:  [facility] 04/07/12 0310 04/06/12 0835 04/05/12 0335  WBC 8.2 8.3 6.3  HGB 13.9 14.8 14.5  PLT 142* 157 151  LABCREA -- -- --  CREATININE 0.69 0.61 0.59   Estimated Creatinine Clearance: 71.5 ml/min (by C-G formula based on Cr of 0.69). and normalized formula   Microbiology: Recent Results (from the past 720 hour(s))  CULTURE, BLOOD (ROUTINE X 2)     Status: Normal   Collection Time   03/10/12  5:15 PM      Component Value Range Status Comment   Specimen Description BLOOD ARM RIGHT   Final    Special Requests BOTTLES DRAWN AEROBIC AND ANAEROBIC 10CC   Final    Culture  Setup Time 03/11/2012 02:35   Final    Culture NO GROWTH 5 DAYS   Final    Report Status 03/17/2012 FINAL   Final   CULTURE, BLOOD (ROUTINE X 2)     Status: Normal   Collection Time   03/10/12  5:35 PM      Component Value Range Status Comment   Specimen Description BLOOD ARM RIGHT   Final    Special Requests BOTTLES DRAWN AEROBIC AND ANAEROBIC 10CC   Final    Culture  Setup Time 03/11/2012 02:35   Final    Culture NO GROWTH 5 DAYS   Final    Report Status 03/17/2012 FINAL   Final   URINE CULTURE     Status: Normal   Collection Time   03/10/12  6:19 PM      Component Value Range Status Comment   Specimen Description URINE, CLEAN CATCH   Final    Special Requests NONE   Final    Culture  Setup Time  [id number]   Final    Colony Count NO GROWTH   Final    Culture NO GROWTH   Final    Report Status 03/12/2012 FINAL   Final   MRSA PCR SCREENING     Status: Abnormal   Collection Time   03/10/12  6:36 PM      Component Value Range Status Comment   MRSA by PCR POSITIVE (*) NEGATIVE Final   CULTURE, EXPECTORATED SPUTUM-ASSESSMENT     Status: Normal   Collection Time   03/10/12  6:57 PM      Component Value Range Status Comment   Specimen Description SPUTUM   Final    Special Requests NONE   Final    Sputum evaluation     Final    Value: MICROSCOPIC FINDINGS SUGGEST THAT THIS SPECIMEN IS NOT REPRESENTATIVE OF LOWER RESPIRATORY SECRETIONS. PLEASE RECOLLECT.     CALLED TO RN T.THOMPSON AT 0133 03/11/12 BY L.PITT    Report Status 03/11/2012 FINAL   Final   CULTURE, EXPECTORATED SPUTUM-ASSESSMENT     Status: Normal   Collection Time   03/11/12  6:58 AM      Component Value Range Status Comment  Specimen Description SPUTUM   Final    Special Requests NONE   Final    Sputum evaluation     Final    Value: THIS SPECIMEN IS ACCEPTABLE. RESPIRATORY CULTURE REPORT TO FOLLOW.   Report Status 03/11/2012 FINAL   Final   CULTURE, RESPIRATORY     Status: Normal   Collection Time   03/11/12  6:58 AM      Component Value Range Status Comment   Specimen Description SPUTUM   Final    Special Requests NONE   Final    Gram Stain     Final    Value: RARE WBC PRESENT, PREDOMINANTLY PMN     RARE SQUAMOUS EPITHELIAL CELLS PRESENT     NO ORGANISMS SEEN   Culture NORMAL OROPHARYNGEAL FLORA   Final    Report Status 03/13/2012 FINAL   Final   URINE CULTURE     Status: Normal   Collection Time   04/04/12  9:25 AM      Component Value Range Status Comment   Specimen Description URINE, CATHETERIZED   Final    Special Requests NONE   Final    Culture  Setup Time 04/04/2012 16:04   Final    Colony Count >=100,000 COLONIES/ML   Final    Culture     Final    Value: ESCHERICHIA COLI     Note: Confirmed  Extended Spectrum Beta-Lactamase Producer (ESBL) CRITICAL RESULT CALLED TO, READ BACK BY AND VERIFIED WITH: CHERYL D AT 0446 04/07/12 BY SNOLO   Report Status 04/07/2012 FINAL   Final    Organism ID, Bacteria ESCHERICHIA COLI   Final   CULTURE, BLOOD (ROUTINE X 2)     Status: Normal (Preliminary result)   Collection Time   04/04/12  9:56 AM      Component Value Range Status Comment   Specimen Description BLOOD RIGHT ARM   Final    Special Requests BOTTLES DRAWN AEROBIC AND ANAEROBIC 5CC EACH   Final    Culture  Setup Time 04/04/2012 16:04   Final    Culture     Final    Value:        BLOOD CULTURE RECEIVED NO GROWTH TO DATE CULTURE WILL BE HELD FOR 5 DAYS BEFORE ISSUING A FINAL NEGATIVE REPORT   Report Status PENDING   Incomplete   CULTURE, BLOOD (ROUTINE X 2)     Status: Normal (Preliminary result)   Collection Time   04/04/12  9:56 AM      Component Value Range Status Comment   Specimen Description BLOOD RIGHT HAND   Final    Special Requests BOTTLES DRAWN AEROBIC AND ANAEROBIC 3CC EACH   Final    Culture  Setup Time 04/04/2012 16:04   Final    Culture     Final    Value:        BLOOD CULTURE RECEIVED NO GROWTH TO DATE CULTURE WILL BE HELD FOR 5 DAYS BEFORE ISSUING A FINAL NEGATIVE REPORT   Report Status PENDING   Incomplete   MRSA PCR SCREENING     Status: Abnormal   Collection Time   04/04/12  6:23 PM      Component Value Range Status Comment   MRSA by PCR POSITIVE (*) NEGATIVE Final   CULTURE, EXPECTORATED SPUTUM-ASSESSMENT     Status: Normal   Collection Time   04/04/12  6:26 PM      Component Value Range Status Comment   Specimen Description SPUTUM  Final    Special Requests Normal   Final    Sputum evaluation     Final    Value: THIS SPECIMEN IS ACCEPTABLE. RESPIRATORY CULTURE REPORT TO FOLLOW.   Report Status 04/04/2012 FINAL   Final   CULTURE, RESPIRATORY     Status: Normal (Preliminary result)   Collection Time   04/04/12  6:26 PM      Component Value Range Status  Comment   Specimen Description SPUTUM   Final    Special Requests NONE   Final    Gram Stain     Final    Value: NO WBC SEEN     NO SQUAMOUS EPITHELIAL CELLS SEEN     RARE GRAM NEGATIVE COCCOBACILLI   Culture     Final    Value: ABUNDANT HAEMOPHILUS INFLUENZAE     Note: BETA LACTAMASE NEGATIVE   Report Status PENDING   Incomplete     Medical History: Past Medical History  Diagnosis Date  . Pneumonia 12/06/10    healthcare-associated/ left lower lobe  . COPD (chronic obstructive pulmonary disease)     severe stage IV  . Atrial fibrillation     on Coumadin with St. Jude single-chamber pacemaker  . Diabetes mellitus     type 2; s/p Prednisone therapy for pneumonia 12/2010  . Coronary artery disease     s/p anterior STEMI 09/2009; cath. revealed mid LAD 40%, distal LAD 100%- PTCA, prox. RCA 40%, mid. RCA 100% with good collateral filling of PDA; EF 25%; NSTEMI 07/2011 - PCI/DES 100% LCX  . ST elevation (STEMI) myocardial infarction 01//11/12    anterior  . CHF (congestive heart failure)     EF 25% on 09/26/10; EF 50-55% and grade 1 diastolic dysfunction on ECHO 08/2010   . Hypertension   . Hyperlipidemia   . AAA (abdominal aortic aneurysm)     3 cm infrarenal abdominal aortic aneurysm per aorta ultrasound 03/2010  . Osteoarthritis     s/p bilat hip arthroplasty  . Angina   . Ischemic cardiomyopathy     EF 35% LHC 11/12  . GERD (gastroesophageal reflux disease)   . Hypercholesteremia   . PVD (peripheral vascular disease)   . Anxiety     Medications:  Scheduled:    . amiodarone  200 mg Oral BID  . antiseptic oral rinse  15 mL Mouth Rinse q12n4p  . atorvastatin  10 mg Oral q1800  . budesonide  0.5 mg Nebulization BID  . chlorhexidine  15 mL Mouth Rinse BID  . Chlorhexidine Gluconate Cloth  6 each Topical Q0600  . clopidogrel  75 mg Oral Q breakfast  . digoxin  0.25 mg Oral Daily  . feeding supplement  237 mL Oral BID BM  . furosemide  40 mg Intravenous Daily  .  insulin aspart  0-15 Units Subcutaneous TID WC  . insulin aspart  16 Units Subcutaneous Once  . insulin aspart  18 Units Subcutaneous Once  . insulin glargine  12 Units Subcutaneous BID  . ipratropium  0.5 mg Nebulization Q6H  . levalbuterol  0.63 mg Nebulization Q6H  . metoprolol  5 mg Intravenous Q8H  . morphine  15 mg Oral Daily  . mupirocin ointment  1 application Nasal BID  . potassium gluconate  595 mg Oral TID  . predniSONE  60 mg Oral QAC breakfast  . senna-docusate  1 tablet Oral QHS  . sodium chloride  3 mL Intravenous Q12H  . spironolactone  50 mg Oral Daily  .  warfarin  5 mg Oral ONCE-1800  . Warfarin - Pharmacist Dosing Inpatient   Does not apply q1800  . DISCONTD: ceFEPime (MAXIPIME) IV  1 g Intravenous Q12H  . DISCONTD: insulin glargine  10 Units Subcutaneous Q breakfast  . DISCONTD: insulin glargine  10 Units Subcutaneous BID  . DISCONTD: methylPREDNISolone (SOLU-MEDROL) injection  125 mg Intravenous Q6H  . DISCONTD: methylPREDNISolone (SOLU-MEDROL) injection  80 mg Intravenous Q6H   Infusions:    . 0.9 % NaCl with KCl 20 mEq / L 10 mL (04/04/12 1628)  . diltiazem (CARDIZEM) infusion 20 mg/hr (04/07/12 0800)   Assessment:  78 year-old SNF resident with multiple hospital admissions, admitted 7/20 with SOB, Afib with RVR  Urine culture with >100k E.coli, +ESBL producer  Sputum culture with abundant Heemophilis influenzae, sensitivities pending  Currently, carbapenems are the drugs of choice for ESBL producing organisms  Goal of Therapy:  Eradication of infection Primaxin dosing based on weight/renal function  Plan:   Primaxin 500mg  IV q6h Follow up renal function & cultures Follow up duration of therapy (typically 7 days) and transition to po  Loralee Pacas, PharmD, BCPS Pager: [phone] 04/07/2012,9:05 AM

## 2012-04-07 NOTE — Clinical Social Work Psychosocial (Signed)
Clinical Social Work Department BRIEF PSYCHOSOCIAL ASSESSMENT 04/07/2012  Patient:  [name], [name]     Account Number:  [id number]     Admit date:  04/04/2012  Clinical Social Worker:  Jodelle Red  Date/Time:  04/07/2012 10:28 AM  Referred by:  CSW  Date Referred:  04/06/2012 Referred for  SNF Placement   Other Referral:   return to SNF   Interview type:  Patient Other interview type:   daughter, SNF, chart review and discussion with RN    PSYCHOSOCIAL DATA Living Status:  FACILITY Admitted from facility:  [facility] Level of care:  Skilled Nursing Facility Primary support name:  [name] Primary support relationship to patient:  CHILD, ADULT Degree of support available:   good from children and extended family    CURRENT CONCERNS Current Concerns  Post-Acute Placement  Adjustment to Illness   Other Concerns:    SOCIAL WORK ASSESSMENT / PLAN CSW met with Pt and daughter due to Pt from SNF and now with [facility]. Daughter states that current plan is to return to SNF and attempt to do more rehab depending on how Pt progresses. Pt could benefit from MOST form and appears to be discussed by PMT. CSW shared coping techiniques and resources to assist. Daughter states they are doing well currently, but agree to reach out to CSW as needed.   Assessment/plan status:  Psychosocial Support/Ongoing Assessment of Needs Other assessment/ plan:   Resources as needed and assist with return to SNF.   Information/referral to community resources:   Call made to [facility], per rep they will accept back when ready.    PATIENT'S/FAMILY'S RESPONSE TO PLAN OF CARE: Family aware and agrees to plan. CSW began updates to [facility] and placed at front of chart for signing. FL2 will need additional updates as Pt progresses.    [name], LCSWA Clinical Social Worker [facility] Cell [phone]

## 2012-04-07 NOTE — Progress Notes (Signed)
Subjective:  Complains of being tired and no appetite. No chest pain.  Dyspnea about the same. He is pleased with plans for SNF with palliative care to follow him there.  Objective:  Vital Signs in the last 24 hours: Temp:  [97.3 F (36.3 C)-98.6 F (37 C)] 98.4 F (36.9 C) (07/23 1300) Pulse Rate:  [43-147] 140  (07/23 1300) Resp:  [16-29] 16  (07/23 1300) BP: (97-137)/(47-68) 121/61 mmHg (07/23 1300) SpO2:  [86 %-100 %] 97 % (07/23 1300) Weight:  [153 lb 14.1 oz (69.8 kg)] 153 lb 14.1 oz (69.8 kg) (07/23 0600)  Intake/Output from previous day: 07/22 0701 - 07/23 0700 In: 1313.3 [P.O.:540; I.V.:673.3; IV Piggyback:100] Out: 1690 [Urine:1690] Intake/Output from this shift: Total I/O In: 282 [I.V.:170; IV Piggyback:112] Out: 660 [Urine:660]     . amiodarone  200 mg Oral BID  . antiseptic oral rinse  15 mL Mouth Rinse q12n4p  . atorvastatin  10 mg Oral q1800  . budesonide  0.5 mg Nebulization BID  . chlorhexidine  15 mL Mouth Rinse BID  . Chlorhexidine Gluconate Cloth  6 each Topical Q0600  . clopidogrel  75 mg Oral Q breakfast  . digoxin  0.25 mg Oral Daily  . feeding supplement  237 mL Oral BID BM  . furosemide  40 mg Intravenous Daily  . imipenem-cilastatin  500 mg Intravenous Q6H  . insulin aspart  0-15 Units Subcutaneous TID WC  . insulin aspart  16 Units Subcutaneous Once  . insulin glargine  12 Units Subcutaneous BID  . ipratropium  0.5 mg Nebulization Q6H  . levalbuterol  0.63 mg Nebulization Q6H  . metoprolol  5 mg Intravenous Q8H  . mupirocin ointment  1 application Nasal BID  . potassium gluconate  595 mg Oral TID  . predniSONE  60 mg Oral QAC breakfast  . senna-docusate  1 tablet Oral QHS  . sodium chloride  3 mL Intravenous Q12H  . spironolactone  50 mg Oral Daily  . warfarin  2.5 mg Oral ONCE-1800  . warfarin  5 mg Oral ONCE-1800  . Warfarin - Pharmacist Dosing Inpatient   Does not apply q1800  . DISCONTD: ceFEPime (MAXIPIME) IV  1 g Intravenous  Q12H  . DISCONTD: feeding supplement  237 mL Oral BID BM  . DISCONTD: insulin glargine  10 Units Subcutaneous Q breakfast  . DISCONTD: insulin glargine  10 Units Subcutaneous BID  . DISCONTD: methylPREDNISolone (SOLU-MEDROL) injection  125 mg Intravenous Q6H  . DISCONTD: methylPREDNISolone (SOLU-MEDROL) injection  80 mg Intravenous Q6H  . DISCONTD: morphine  15 mg Oral Daily      . 0.9 % NaCl with KCl 20 mEq / L 10 mL (04/04/12 1628)  . diltiazem (CARDIZEM) infusion 20 mg/hr (04/07/12 1300)    Physical Exam: The patient appears to be in mild respiratory distress with any movement.  Head and neck exam reveals that the pupils are equal and reactive.  The extraocular movements are full.  There is no scleral icterus.  Mouth and pharynx are benign.  No lymphadenopathy.  No carotid bruits.  The jugular venous pressure is normal.  Thyroid is not enlarged or tender.  Chest reveals bilateral coarse rhonchi partially clear with cough.  Heart reveals no abnormal lift or heave.  First and second heart sounds are normal.  There is no murmur gallop rub or click. Pulse rapid, irregular.  The abdomen is soft and nontender.  Bowel sounds are normoactive.  There is no hepatosplenomegaly or mass.  There  are no abdominal bruits.  Extremities reveal no phlebitis or edema.  Pedal pulses are good.  There is no cyanosis or clubbing.  Neurologic exam is normal strength and no lateralizing weakness.  No sensory deficits.  Integument reveals no rash  Lab Results:  Basename 04/07/12 0310 04/06/12 0835  WBC 8.2 8.3  HGB 13.9 14.8  PLT 142* 157    Basename 04/07/12 0310 04/06/12 0835  NA 129* 129*  K 3.5 3.9  CL 86* 84*  CO2 38* 36*  GLUCOSE 203* 301*  BUN 30* 27*  CREATININE 0.69 0.61    Basename 04/05/12 0335  TROPONINI <0.30   Hepatic Function Panel  Basename 04/05/12 0335  PROT 6.1  ALBUMIN 2.5*  AST 11  ALT 19  ALKPHOS 84  BILITOT 0.4  BILIDIR --  IBILI --   No results found  for this basename: CHOL in the last 72 hours No results found for this basename: PROTIME in the last 72 hours  Imaging: Imaging results have been reviewed  Cardiac Studies:  Assessment/Plan:  1. End stage COPD 2. Chronic atrial fib with poorly controlled ventricular response. 3. Chronic systolic CHF EF 40-45%  Plan:  There has been mild improvement in ventricular rate since starting amiodarone in low dose. Will continue same dose and consider switch back to oral diltiazem and oral BB if stable. Digoxin level in am since amio may affect digoxin dosing.  LOS: 3 days    Cassell Clement 04/07/2012, 2:10 PM

## 2012-04-07 NOTE — Progress Notes (Signed)
INITIAL ADULT NUTRITION ASSESSMENT Date: 04/07/2012   Time: 2:47 PM Reason for Assessment: Poor PO, discussed patient in rounds  ASSESSMENT: Male 78 y.o.  Dx: Acute-on-chronic respiratory failure  Hx:  Past Medical History  Diagnosis Date  . Pneumonia 12/06/10    healthcare-associated/ left lower lobe  . COPD (chronic obstructive pulmonary disease)     severe stage IV  . Atrial fibrillation     on Coumadin with St. Jude single-chamber pacemaker  . Diabetes mellitus     type 2; s/p Prednisone therapy for pneumonia 12/2010  . Coronary artery disease     s/p anterior STEMI 09/2009; cath. revealed mid LAD 40%, distal LAD 100%- PTCA, prox. RCA 40%, mid. RCA 100% with good collateral filling of PDA; EF 25%; NSTEMI 07/2011 - PCI/DES 100% LCX  . ST elevation (STEMI) myocardial infarction 01//11/12    anterior  . CHF (congestive heart failure)     EF 25% on 09/26/10; EF 50-55% and grade 1 diastolic dysfunction on ECHO 08/2010   . Hypertension   . Hyperlipidemia   . AAA (abdominal aortic aneurysm)     3 cm infrarenal abdominal aortic aneurysm per aorta ultrasound 03/2010  . Osteoarthritis     s/p bilat hip arthroplasty  . Angina   . Ischemic cardiomyopathy     EF 35% LHC 11/12  . GERD (gastroesophageal reflux disease)   . Hypercholesteremia   . PVD (peripheral vascular disease)   . Anxiety     Related Meds:  Scheduled Meds:   . amiodarone  200 mg Oral BID  . antiseptic oral rinse  15 mL Mouth Rinse q12n4p  . atorvastatin  10 mg Oral q1800  . budesonide  0.5 mg Nebulization BID  . chlorhexidine  15 mL Mouth Rinse BID  . Chlorhexidine Gluconate Cloth  6 each Topical Q0600  . clopidogrel  75 mg Oral Q breakfast  . digoxin  0.25 mg Oral Daily  . feeding supplement  237 mL Oral BID BM  . furosemide  40 mg Intravenous Daily  . imipenem-cilastatin  500 mg Intravenous Q6H  . insulin aspart  0-15 Units Subcutaneous TID WC  . insulin aspart  16 Units Subcutaneous Once  . insulin  glargine  12 Units Subcutaneous BID  . ipratropium  0.5 mg Nebulization Q6H  . levalbuterol  0.63 mg Nebulization Q6H  . metoprolol  5 mg Intravenous Q8H  . mupirocin ointment  1 application Nasal BID  . potassium gluconate  595 mg Oral TID  . predniSONE  60 mg Oral QAC breakfast  . senna-docusate  1 tablet Oral QHS  . sodium chloride  3 mL Intravenous Q12H  . spironolactone  50 mg Oral Daily  . warfarin  2.5 mg Oral ONCE-1800  . warfarin  5 mg Oral ONCE-1800  . Warfarin - Pharmacist Dosing Inpatient   Does not apply q1800  . DISCONTD: ceFEPime (MAXIPIME) IV  1 g Intravenous Q12H  . DISCONTD: feeding supplement  237 mL Oral BID BM  . DISCONTD: insulin glargine  10 Units Subcutaneous Q breakfast  . DISCONTD: insulin glargine  10 Units Subcutaneous BID  . DISCONTD: methylPREDNISolone (SOLU-MEDROL) injection  125 mg Intravenous Q6H  . DISCONTD: methylPREDNISolone (SOLU-MEDROL) injection  80 mg Intravenous Q6H  . DISCONTD: morphine  15 mg Oral Daily   Continuous Infusions:   . 0.9 % NaCl with KCl 20 mEq / L 10 mL (04/04/12 1628)  . diltiazem (CARDIZEM) infusion 20 mg/hr (04/07/12 1400)   PRN Meds:.acetaminophen, acetaminophen, alum &  mag hydroxide-simeth, bisacodyl, morphine, morphine injection, nitroGLYCERIN, ondansetron (ZOFRAN) IV, ondansetron, zolpidem, DISCONTD: oxyCODONE   Ht: 5\' 9"  (175.3 cm)  Wt: 153 lb 14.1 oz (69.8 kg)  Ideal Wt: 72.7 kg % Ideal Wt: 95.6% Wt Readings from Last 10 Encounters:  04/07/12 153 lb 14.1 oz (69.8 kg)  03/19/12 152 lb 14.4 oz (69.355 kg)  03/05/12 172 lb 9.6 oz (78.291 kg)  02/04/12 175 lb 9.6 oz (79.652 kg)  12/10/11 169 lb 9.6 oz (76.93 kg)  11/13/11 154 lb 4.8 oz (69.99 kg)  11/07/11 162 lb (73.483 kg)  11/04/11 166 lb 6.4 oz (75.479 kg)  10/11/11 179 lb 1.9 oz (81.248 kg)  09/26/11 177 lb 11.1 oz (80.6 kg)  *Unintentional weight loss of 26 lb over 6 months, 14.5% from baseline.   Usual Wt: 170 lb per patient.  % Usual Wt: 90%  BMI:  22.59 kg/m^2 (WNL)  Food/Nutrition Related Hx: Patient reported his appetite has been poor to fair. PO intake documented 50 of meals on carb modified diet. Pt reported he was not eating well for a few days PTA. Patient reported he has had some weight loss. Patient stated he dislikes Vanilla nutrition supplements. Noted patient with previous admission to [facility].   Labs:  CMP     Component Value Date/Time   NA 129* 04/07/2012 0310   K 3.5 04/07/2012 0310   CL 86* 04/07/2012 0310   CO2 38* 04/07/2012 0310   GLUCOSE 203* 04/07/2012 0310   BUN 30* 04/07/2012 0310   CREATININE 0.69 04/07/2012 0310   CALCIUM 8.6 04/07/2012 0310   PROT 6.1 04/05/2012 0335   ALBUMIN 2.5* 04/05/2012 0335   AST 11 04/05/2012 0335   ALT 19 04/05/2012 0335   ALKPHOS 84 04/05/2012 0335   BILITOT 0.4 04/05/2012 0335   GFRNONAA 87* 04/07/2012 0310   GFRAA >90 04/07/2012 0310    Intake/Output Summary (Last 24 hours) at 04/07/12 1453 Last data filed at 04/07/12 1400  Gross per 24 hour  Intake 1080.25 ml  Output   2200 ml  Net -1119.75 ml     Diet Order: Carb Control  Supplements/Tube Feeding: Ensure BID provides 250 kcal and 18 grams of protein daily.   IVF:    0.9 % NaCl with KCl 20 mEq / L Last Rate: 10 mL (04/04/12 1628)  diltiazem (CARDIZEM) infusion Last Rate: 20 mg/hr (04/07/12 1400)    Estimated Nutritional Needs:   Kcal: 1500-1750 Protein: 83-104 grams  Fluid: 1 ml per kcal intake  NUTRITION DIAGNOSIS: -Inadequate oral intake (NI-2.1).  Status: Ongoing  RELATED TO: poor appetite and unintentional weight loss  AS EVIDENCE BY: patient with po intake of 50% at meals and unintentional weight loss of 14.5% from baseline over 6 months.   MONITORING/EVALUATION(Goals): PO intake, weights, labs 1. PO intake > 75% at meals and supplements.  2. Minimize weight loss  EDUCATION NEEDS: -No education needs identified at this time  INTERVENTION: 1. Will d/c Ensure BID per discussion with RN. Will order patient  Glucerna nutrition supplement BID. Provides 440 kcal and 20 grams of protein daily  Dietitian [phone]  DOCUMENTATION CODES Per approved criteria  -Severe malnutrition in the context of acute illness or injury  *Patient meets malnutrition criteria due to PO intake meeting < 50% of estimated energy needs for > or equal to 5 days and unintentional weight loss > 7.5% of 3 months.   Iven Finn [facility] 04/07/2012, 2:47 PM

## 2012-04-08 DIAGNOSIS — R0609 Other forms of dyspnea: Secondary | ICD-10-CM

## 2012-04-08 LAB — PROTIME-INR: Prothrombin Time: 28.9 seconds — ABNORMAL HIGH (ref 11.6–15.2)

## 2012-04-08 LAB — CBC
HCT: 40.8 % (ref 39.0–52.0)
Hemoglobin: 14 g/dL (ref 13.0–17.0)
MCH: 30 pg (ref 26.0–34.0)
MCV: 87.4 fL (ref 78.0–100.0)
RBC: 4.67 MIL/uL (ref 4.22–5.81)

## 2012-04-08 LAB — BASIC METABOLIC PANEL
BUN: 23 mg/dL (ref 6–23)
CO2: 36 mEq/L — ABNORMAL HIGH (ref 19–32)
Calcium: 8.3 mg/dL — ABNORMAL LOW (ref 8.4–10.5)
Glucose, Bld: 280 mg/dL — ABNORMAL HIGH (ref 70–99)
Sodium: 126 mEq/L — ABNORMAL LOW (ref 135–145)

## 2012-04-08 LAB — GLUCOSE, CAPILLARY
Glucose-Capillary: 268 mg/dL — ABNORMAL HIGH (ref 70–99)
Glucose-Capillary: 276 mg/dL — ABNORMAL HIGH (ref 70–99)
Glucose-Capillary: 242 mg/dL — ABNORMAL HIGH (ref 70–99)

## 2012-04-08 LAB — TSH: TSH: 0.405 u[IU]/mL (ref 0.350–4.500)

## 2012-04-08 MED ORDER — WARFARIN SODIUM 2.5 MG PO TABS
2.5000 mg | ORAL_TABLET | Freq: Once | ORAL | Status: AC
  Administered 2012-04-08: 2.5 mg via ORAL
  Filled 2012-04-08: qty 1

## 2012-04-08 MED ORDER — DILTIAZEM HCL ER COATED BEADS 360 MG PO CP24
360.0000 mg | ORAL_CAPSULE | Freq: Every day | ORAL | Status: DC
  Administered 2012-04-08 – 2012-04-14 (×7): 360 mg via ORAL
  Filled 2012-04-08 (×7): qty 1

## 2012-04-08 MED ORDER — INSULIN GLARGINE 100 UNIT/ML ~~LOC~~ SOLN
20.0000 [IU] | Freq: Two times a day (BID) | SUBCUTANEOUS | Status: DC
  Administered 2012-04-08 – 2012-04-11 (×7): 20 [IU] via SUBCUTANEOUS

## 2012-04-08 MED ORDER — BISOPROLOL FUMARATE 10 MG PO TABS
10.0000 mg | ORAL_TABLET | Freq: Every day | ORAL | Status: DC
  Administered 2012-04-08 – 2012-04-14 (×7): 10 mg via ORAL
  Filled 2012-04-08 (×7): qty 1

## 2012-04-08 MED ORDER — DIGOXIN 125 MCG PO TABS
0.1250 mg | ORAL_TABLET | Freq: Every day | ORAL | Status: DC
  Administered 2012-04-08 – 2012-04-14 (×7): 0.125 mg via ORAL
  Filled 2012-04-08 (×7): qty 1

## 2012-04-08 NOTE — Evaluation (Signed)
Occupational Therapy Evaluation Patient Details Name: [name] MRN: [id number] DOB: 04/27/1928 Today's Date: 04/08/2012 Time: 4098-1191 OT Time Calculation (min): 33 min  OT Assessment / Plan / Recommendation Clinical Impression  This 78 year old man was admitted for  acute or chronic respiratory failure.  He is very deconditioned and will benefit from OT to increase functional activity level prior to stsnf.    OT Assessment  Patient needs continued OT Services    Follow Up Recommendations  Skilled nursing facility    Barriers to Discharge      Equipment Recommendations  Defer to next venue    Recommendations for Other Services    Frequency  Min 2X/week    Precautions / Restrictions Precautions Precautions: Fall Restrictions Weight Bearing Restrictions: No   Pertinent Vitals/Pain No pain.  Sats 94%, 76% with activity 90% end of session on 4 liters.  HR 115; MAP 75 bed--69 sitting eob    ADL  Eating/Feeding: Simulated;Independent Where Assessed - Eating/Feeding: Bed level Grooming: Simulated;Minimal assistance (extra time) Where Assessed - Grooming: Supine, head of bed up Upper Body Bathing: Simulated;Maximal assistance Where Assessed - Upper Body Bathing: Supine, head of bed up Lower Body Bathing: Simulated;+1 Total assistance Lower Body Bathing: Patient Percentage: 10% Where Assessed - Lower Body Bathing: Rolling right and/or left Upper Body Dressing: Simulated;Minimal assistance Where Assessed - Upper Body Dressing: Supine, head of bed up Lower Body Dressing: Simulated;+1 Total assistance Where Assessed - Lower Body Dressing: Rolling right and/or left Transfers/Ambulation Related to ADLs: sat eob with bil ue support; supervision; desats ADL Comments: needs to be done from bed level due to weakness and desaturation.  Pt very motivated but desaturates with little movement    OT Diagnosis: Generalized weakness  OT Problem List: Decreased strength;Decreased  activity tolerance;Decreased knowledge of use of DME or AE;Cardiopulmonary status limiting activity OT Treatment Interventions: Self-care/ADL training;Therapeutic activities;Patient/family education;Energy conservation   OT Goals Acute Rehab OT Goals OT Goal Formulation: With patient Time For Goal Achievement: 04/22/12 Potential to Achieve Goals: Good ADL Goals Pt Will Perform Grooming: with set-up;Sitting, edge of bed;Unsupported ADL Goal: Grooming - Progress: Goal set today Pt Will Perform Upper Body Bathing: with set-up;Sitting, edge of bed;Unsupported (with rest breaks) ADL Goal: Upper Body Bathing - Progress: Goal set today Pt Will Transfer to Toilet: with 2+ total assist;Stand pivot transfer;3-in-1;Other (comment) (pt 40%) ADL Goal: Toilet Transfer - Progress: Goal set today Miscellaneous OT Goals Miscellaneous OT Goal #1: pt will tolerate sitting eob for 5 minutes with unilateral support at mod independent level (balance) performing light adl activity OT Goal: Miscellaneous Goal #1 - Progress: Goal set today Miscellaneous OT Goal #2: pt will initiate at least 2 rest breaks per session for energy conservation OT Goal: Miscellaneous Goal #2 - Progress: Goal set today  Visit Information  Last OT Received On: 04/08/12 Assistance Needed: +2 PT/OT Co-Evaluation/Treatment: Yes    Subjective Data  Subjective: I'm pretty weak Patient Stated Goal: get strength back and breathing better   Prior Functioning  Vision/Perception  Home Living Lives With:  (was at [facility]; previously [facility]) Available Help at Discharge: Skilled Nursing Facility Prior Function Level of Independence: Needs assistance Communication Communication: No difficulties      Cognition  Overall Cognitive Status: Appears within functional limits for tasks assessed/performed Arousal/Alertness: Awake/alert Orientation Level: Appears intact for tasks assessed Behavior During Session: [facility] for tasks  performed Cognition - Other Comments: pt wasn't sure if he came from [place] or [facility] for  this admission    Extremity/Trunk Assessment Right Upper Extremity Assessment RUE ROM/Strength/Tone: [facility] for tasks assessed Left Upper Extremity Assessment LUE ROM/Strength/Tone: WFL for tasks assessed Trunk Assessment Trunk Assessment: Other exceptions (sits with flexed trunk; bil ue support)   Mobility Bed Mobility Bed Mobility: Sit to Supine Supine to Sit: 3: Mod assist Transfers Details for Transfer Assistance: attempted to stand; unable with total A X 2   Exercise    Balance Balance Balance Assessed: Yes Static Sitting Balance Static Sitting - Balance Support: Bilateral upper extremity supported Static Sitting - Level of Assistance: 6: Modified independent (Device/Increase time)  End of Session OT - End of Session Activity Tolerance: Patient limited by fatigue;Other (comment) (sats) Patient left: in bed;with call bell/phone within reach Nurse Communication: Need for lift equipment  GO     Aislin Onofre 04/08/2012, 2:53 PM Marica Otter, OTR/L [phone] 04/08/2012

## 2012-04-08 NOTE — Evaluation (Signed)
Physical Therapy Evaluation Patient Details Name: [name] MRN: [id number] DOB: [birth year] Today's Date: 04/08/2012 Time: 2841-3244 PT Time Calculation (min): 37 min  PT Assessment / Plan / Recommendation Clinical Impression  78 yo male admitted with chest pain/COPD exacerbation presents with decr mobility and decr functional capacity; Will benefit from PT to maximize independence and safety with mobility, transfers, ambulation. pt is very weak and at risk for legs to buckle.    PT Assessment  Patient needs continued PT services    Follow Up Recommendations  Skilled nursing facility    Barriers to Discharge        Equipment Recommendations  Defer to next venue    Recommendations for Other Services OT consult   Frequency Min 3X/week    Precautions / Restrictions Precautions Precautions: Fall Precaution Comments: dyspnea, decreased BP, afib Restrictions Weight Bearing Restrictions: No   Pertinent Vitals/Pain sats drop into 70's sitting and attempting for transfer.  HR into 115.  RR 30 w/ activity. RN aware.      Mobility  Bed Mobility Bed Mobility: Sit to Supine Supine to Sit: 3: Mod assist Transfers Details for Transfer Assistance: attempted to stand; unable with total A X 2    Exercises     PT Diagnosis:    PT Problem List: Decreased strength;Decreased activity tolerance;Decreased mobility;Cardiopulmonary status limiting activity PT Treatment Interventions: DME instruction;Gait training;Functional mobility training;Therapeutic activities;Therapeutic exercise;Patient/family education   PT Goals Acute Rehab PT Goals PT Goal Formulation: With patient Pt will go Supine/Side to Sit: with supervision;with HOB 0 degrees PT Goal: Supine/Side to Sit - Progress: Goal set today Pt will go Sit to Supine/Side: with supervision PT Goal: Sit to Supine/Side - Progress: Goal set today Pt will go Sit to Stand: with min assist;with upper extremity assist PT Goal: Sit to  Stand - Progress: Goal set today Pt will go Stand to Sit: with min assist;with upper extremity assist PT Goal: Stand to Sit - Progress: Goal set today Pt will Transfer Bed to Chair/Chair to Bed: with min assist PT Transfer Goal: Bed to Chair/Chair to Bed - Progress: Goal set today Pt will Ambulate: 16 - 50 feet;with min assist;with rolling walker PT Goal: Ambulate - Progress: Goal set today  Visit Information  Last PT Received On: 04/08/12 Assistance Needed: +2 PT/OT Co-Evaluation/Treatment: Yes    Subjective Data  Subjective: i am really weak Patient Stated Goal: i was at rehab   Prior Functioning  Home Living Lives With:  (was at [facility]; previously [facility]) Available Help at Discharge: Skilled Nursing Facility Prior Function Level of Independence: Needs assistance Communication Communication: No difficulties    Cognition  Overall Cognitive Status: Appears within functional limits for tasks assessed/performed Arousal/Alertness: Awake/alert Orientation Level: Appears intact for tasks assessed Behavior During Session: [facility] for tasks performed Cognition - Other Comments: pt wasn't sure if he came from [place] or [facility] for this admission    Extremity/Trunk Assessment Right Upper Extremity Assessment RUE ROM/Strength/Tone: [facility] for tasks assessed Left Upper Extremity Assessment LUE ROM/Strength/Tone: [facility] for tasks assessed Trunk Assessment Trunk Assessment: Other exceptions (sits with flexed trunk; bil ue support)   Balance Balance Balance Assessed: Yes Static Sitting Balance Static Sitting - Balance Support: Bilateral upper extremity supported Static Sitting - Level of Assistance: 6: Modified independent (Device/Increase time)  End of Session PT - End of Session Activity Tolerance: Patient limited by fatigue;Treatment limited secondary to medical complications (Comment) (sats drop into 70's 4 l.) Patient left: in bed Nurse Communication:  Mobility  status;Need for lift equipment  GP     Rada Hay 04/08/2012, 3:02 PM  [phone]

## 2012-04-08 NOTE — Progress Notes (Signed)
ANTICOAGULATION CONSULT NOTE - Follow Up Consult  Pharmacy Consult for Coumadin Indication: atrial fibrillation  No Known Allergies  Patient Measurements: Height: 5\' 9"  (175.3 cm) Weight: 153 lb 14.1 oz (69.8 kg) IBW/kg (Calculated) : 70.7    Vital Signs: Temp: 97.9 F (36.6 C) (07/24 1100) Temp src: Core (Comment) (07/24 1100) BP: 102/50 mmHg (07/24 1100) Pulse Rate: 102  (07/24 1100)  Labs:  Basename 04/08/12 1015 04/08/12 0310 04/07/12 0310 04/06/12 0835 04/06/12 0328  HGB -- 14.0 13.9 -- --  HCT -- 40.8 40.5 44.0 --  PLT -- 131* 142* 157 --  APTT -- -- -- -- --  LABPROT 28.9* -- 25.5* -- 22.9*  INR 2.67* -- 2.28* -- 1.99*  HEPARINUNFRC -- -- -- -- --  CREATININE -- 0.59 0.69 0.61 --  CKTOTAL -- -- -- -- --  CKMB -- -- -- -- --  TROPONINI -- -- -- -- --    Estimated Creatinine Clearance: 71.5 ml/min (by C-G formula based on Cr of 0.59).   Medications:  Scheduled:     . amiodarone  200 mg Oral BID  . antiseptic oral rinse  15 mL Mouth Rinse q12n4p  . atorvastatin  10 mg Oral q1800  . bisoprolol  10 mg Oral Daily  . budesonide  0.5 mg Nebulization BID  . chlorhexidine  15 mL Mouth Rinse BID  . Chlorhexidine Gluconate Cloth  6 each Topical Q0600  . clopidogrel  75 mg Oral Q breakfast  . digoxin  0.125 mg Oral Daily  . diltiazem  360 mg Oral Daily  . feeding supplement  237 mL Oral BID BM  . imipenem-cilastatin  500 mg Intravenous Q6H  . insulin aspart  0-15 Units Subcutaneous TID WC  . insulin glargine  20 Units Subcutaneous BID  . ipratropium  0.5 mg Nebulization Q6H  . levalbuterol  0.63 mg Nebulization Q6H  . mupirocin ointment  1 application Nasal BID  . potassium gluconate  595 mg Oral TID  . predniSONE  60 mg Oral QAC breakfast  . senna-docusate  1 tablet Oral QHS  . sodium chloride  3 mL Intravenous Q12H  . warfarin  2.5 mg Oral ONCE-1800  . Warfarin - Pharmacist Dosing Inpatient   Does not apply q1800  . DISCONTD: digoxin  0.25 mg Oral Daily    . DISCONTD: feeding supplement  237 mL Oral BID BM  . DISCONTD: feeding supplement  237 mL Oral BID BM  . DISCONTD: furosemide  40 mg Intravenous Daily  . DISCONTD: insulin glargine  12 Units Subcutaneous BID  . DISCONTD: insulin glargine  15 Units Subcutaneous BID  . DISCONTD: metoprolol  5 mg Intravenous Q8H  . DISCONTD: spironolactone  50 mg Oral Daily   Infusions:     . 0.9 % NaCl with KCl 20 mEq / L 10 mL (04/04/12 1628)  . DISCONTD: diltiazem (CARDIZEM) infusion 20 mg/hr (04/08/12 0800)    Assessment:  83 YOM on chronic coumadin for Afib, admitted with acute-on-chronic respiratory failure- multifactorial due to COPD exacerbation, Afib with RVR and CHF  Dose per [place] clinic notes is 5mg  daily except 2.5mg  Mon and Fri  Dose not documented as given 7/20, dose was not ordered 7/21, 5mg  give 7/22, 2.5mg  on 7/23  INR remains therapeutic this am and continues to trend up despite not receiving warfarin on 7/20 or 7/21 - will use conservative dosing for now  Now that amiodarone has started, will need to monitor for long-term interaction with coumadin (increased INR);  amio can also result in elevated digoxin levels (1.2 on 7/20), consider decreasing dig dose by 50% (done) and recheck level at new steady state.  Goal of Therapy:  INR 2-3 Monitor platelets by anticoagulation protocol: Yes   Plan:   Coumadin 2.5mg  po today  Daily PT/INR  Darrol Angel, PharmD Pager: [phone] 04/08/2012,12:29 PM

## 2012-04-08 NOTE — Progress Notes (Signed)
Palliative Medicine Team SW Follow up psychosocial visit with pt at bedside. Pt in good spirits, reiterated plan for continued attempt at rehab and rehospitalization as needed as discussed in GOC. Pt expressed his concern that dtr [name] "has too much on her plate to worry about me". Pt reports continued conversations with family about his advance care goals. Pt voiced his sense of responsibility for making decisions for himself so that his family does not have to and also a sense that his family is "not ready yet", thus he is willing to continue aggressive care for now. Pt appreciative of palliative support.   [name], [place] Pager [phone]

## 2012-04-08 NOTE — Progress Notes (Signed)
Subjective:  Overall seems better this am. Mood is better. Heart rate trending downward.  Objective:  Vital Signs in the last 24 hours: Temp:  [97 F (36.1 C)-98.4 F (36.9 C)] 97 F (36.1 C) (07/24 0800) Pulse Rate:  [30-147] 114  (07/24 0800) Resp:  [12-29] 24  (07/24 0800) BP: (84-135)/(39-75) 118/53 mmHg (07/24 0800) SpO2:  [86 %-100 %] 94 % (07/24 0812)  Intake/Output from previous day: 07/23 0701 - 07/24 0700 In: 1232 [P.O.:120; I.V.:700; IV Piggyback:412] Out: 1745 [Urine:1745] Intake/Output from this shift: Total I/O In: 30 [I.V.:30] Out: 225 [Urine:225]     . amiodarone  200 mg Oral BID  . antiseptic oral rinse  15 mL Mouth Rinse q12n4p  . atorvastatin  10 mg Oral q1800  . bisoprolol  10 mg Oral Daily  . budesonide  0.5 mg Nebulization BID  . chlorhexidine  15 mL Mouth Rinse BID  . Chlorhexidine Gluconate Cloth  6 each Topical Q0600  . clopidogrel  75 mg Oral Q breakfast  . digoxin  0.125 mg Oral Daily  . diltiazem  360 mg Oral Daily  . feeding supplement  237 mL Oral BID BM  . imipenem-cilastatin  500 mg Intravenous Q6H  . insulin aspart  0-15 Units Subcutaneous TID WC  . insulin glargine  20 Units Subcutaneous BID  . ipratropium  0.5 mg Nebulization Q6H  . levalbuterol  0.63 mg Nebulization Q6H  . mupirocin ointment  1 application Nasal BID  . potassium gluconate  595 mg Oral TID  . predniSONE  60 mg Oral QAC breakfast  . senna-docusate  1 tablet Oral QHS  . sodium chloride  3 mL Intravenous Q12H  . warfarin  2.5 mg Oral ONCE-1800  . Warfarin - Pharmacist Dosing Inpatient   Does not apply q1800  . DISCONTD: digoxin  0.25 mg Oral Daily  . DISCONTD: feeding supplement  237 mL Oral BID BM  . DISCONTD: feeding supplement  237 mL Oral BID BM  . DISCONTD: furosemide  40 mg Intravenous Daily  . DISCONTD: insulin glargine  12 Units Subcutaneous BID  . DISCONTD: insulin glargine  15 Units Subcutaneous BID  . DISCONTD: metoprolol  5 mg Intravenous Q8H  .  DISCONTD: morphine  15 mg Oral Daily  . DISCONTD: spironolactone  50 mg Oral Daily      . 0.9 % NaCl with KCl 20 mEq / L 10 mL (04/04/12 1628)  . DISCONTD: diltiazem (CARDIZEM) infusion 20 mg/hr (04/08/12 0800)    Physical Exam: The patient appears to be in mild respiratory distress with any movement.  Head and neck exam reveals that the pupils are equal and reactive.  The extraocular movements are full.  There is no scleral icterus.  Mouth and pharynx are benign.  No lymphadenopathy.  No carotid bruits.  The jugular venous pressure is normal.  Thyroid is not enlarged or tender.  Chest reveals bilateral coarse rhonchi partially clear with cough.  Heart reveals no abnormal lift or heave.  First and second heart sounds are normal.  There is no murmur gallop rub or click. Pulse rapid, irregular.  The abdomen is soft and nontender.  Bowel sounds are normoactive.  There is no hepatosplenomegaly or mass.  There are no abdominal bruits.  Extremities reveal no phlebitis or edema.  Pedal pulses are good.  There is no cyanosis or clubbing.  Neurologic exam is normal strength and no lateralizing weakness.  No sensory deficits.  Integument reveals no rash  Lab Results:  Basename 04/08/12 0310 04/07/12 0310  WBC 8.8 8.2  HGB 14.0 13.9  PLT 131* 142*    Basename 04/08/12 0310 04/07/12 0310  NA 126* 129*  K 3.7 3.5  CL 82* 86*  CO2 36* 38*  GLUCOSE 280* 203*  BUN 23 30*  CREATININE 0.59 0.69   No results found for this basename: TROPONINI:2,CK,MB:2 in the last 72 hours Hepatic Function Panel No results found for this basename: PROT,ALBUMIN,AST,ALT,ALKPHOS,BILITOT,BILIDIR,IBILI in the last 72 hours No results found for this basename: CHOL in the last 72 hours No results found for this basename: PROTIME in the last 72 hours  Imaging: Imaging results have been reviewed  Cardiac Studies:  Assessment/Plan:  1. End stage COPD 2. Chronic atrial fib with rapid ventricular  response. 3. Chronic systolic CHF EF 40-45%  Plan: Switch to po diltiazem 360 CD today. Reduce digoxin dose to 0.125 mg daily (level 1.8).  LOS: 4 days    Cassell Clement 04/08/2012, 8:57 AM

## 2012-04-08 NOTE — Progress Notes (Signed)
Subjective: Feels a little better.  Breathing is improved.  Still very weak.   Objective: Vital signs in last 24 hours: Temp:  [97 F (36.1 C)-98.4 F (36.9 C)] 97.3 F (36.3 C) (07/24 0700) Pulse Rate:  [30-147] 121  (07/24 0700) Resp:  [12-29] 12  (07/24 0700) BP: (84-135)/(39-75) 84/44 mmHg (07/24 0700) SpO2:  [86 %-100 %] 98 % (07/24 0700) Weight change:  Last BM Date: 04/07/12  CBG (last 3)   Basename 04/07/12 2203 04/07/12 1639 04/07/12 1119  GLUCAP 310* 323* 369*    Intake/Output from previous day: 07/23 0701 - 07/24 0700 In: 1232 [P.O.:120; I.V.:700; IV Piggyback:412] Out: 1745 [Urine:1745] Intake/Output this shift:    General appearance: alert and mild tachypnea Eyes: no scleral icterus Throat: oropharynx is a little dry Resp: minimal rhonchi bilaterally, no wheezing Cardio: irregularly irregular rhythm and tachycardic GI: soft, non-tender; bowel sounds normal; no masses,  no organomegaly Extremities: no clubbing, cyanosis or edema, decreased skin turgor   Lab Results:  Basename 04/08/12 0310 04/07/12 0310  NA 126* 129*  K 3.7 3.5  CL 82* 86*  CO2 36* 38*  GLUCOSE 280* 203*  BUN 23 30*  CREATININE 0.59 0.69  CALCIUM 8.3* 8.6  MG -- --  PHOS -- --     Basename 04/08/12 0310 04/07/12 0310  WBC 8.8 8.2  NEUTROABS -- --  HGB 14.0 13.9  HCT 40.8 40.5  MCV 87.4 87.9  PLT 131* 142*   Lab Results  Component Value Date   INR 2.28* 04/07/2012   INR 1.99* 04/06/2012   INR 2.82* 04/05/2012   BNP (last 3 results)  Basename 04/08/12 0310 04/07/12 0310 04/06/12 0835  PROBNP 1764.0* 2108.0* 2624.0*   Digoxin level 1.8  Studies/Results: No results found.   Medications: Scheduled:   . amiodarone  200 mg Oral BID  . antiseptic oral rinse  15 mL Mouth Rinse q12n4p  . atorvastatin  10 mg Oral q1800  . budesonide  0.5 mg Nebulization BID  . chlorhexidine  15 mL Mouth Rinse BID  . Chlorhexidine Gluconate Cloth  6 each Topical Q0600  . clopidogrel   75 mg Oral Q breakfast  . digoxin  0.25 mg Oral Daily  . feeding supplement  237 mL Oral BID BM  . furosemide  40 mg Intravenous Daily  . imipenem-cilastatin  500 mg Intravenous Q6H  . insulin aspart  0-15 Units Subcutaneous TID WC  . insulin glargine  15 Units Subcutaneous BID  . ipratropium  0.5 mg Nebulization Q6H  . levalbuterol  0.63 mg Nebulization Q6H  . metoprolol  5 mg Intravenous Q8H  . mupirocin ointment  1 application Nasal BID  . potassium gluconate  595 mg Oral TID  . predniSONE  60 mg Oral QAC breakfast  . senna-docusate  1 tablet Oral QHS  . sodium chloride  3 mL Intravenous Q12H  . spironolactone  50 mg Oral Daily  . warfarin  2.5 mg Oral ONCE-1800  . Warfarin - Pharmacist Dosing Inpatient   Does not apply q1800  . DISCONTD: ceFEPime (MAXIPIME) IV  1 g Intravenous Q12H  . DISCONTD: feeding supplement  237 mL Oral BID BM  . DISCONTD: feeding supplement  237 mL Oral BID BM  . DISCONTD: insulin glargine  12 Units Subcutaneous BID  . DISCONTD: methylPREDNISolone (SOLU-MEDROL) injection  80 mg Intravenous Q6H  . DISCONTD: morphine  15 mg Oral Daily   Continuous:   . 0.9 % NaCl with KCl 20 mEq / L 10  mL (04/04/12 1628)  . diltiazem (CARDIZEM) infusion 20 mg/hr (04/08/12 0700)    Assessment/Plan: Principal Problem:  1. *Acute-on-chronic respiratory failure- multifactorial due to COPD exacerbation, Afib with RVR and Acute on Chronic CHF. Slow improvement. Continue scheduled nebs, Pulmicort, slow steroid taper, antibiotics- will likely keep on maintenance dose of steroids due to severity of COPD and frequent exacerbations. Wean O2 as tolerated.  Active Problems:  2. COPD exacerbation- continue treatment as above  3. Acute on Chronic CHF (congestive heart failure)- EF 40-45%-  Appears a little dry given hyponatremia and dry mucous membranes.  Will discontinue spironolactone and hold Lasix for 1-2 days.  BNP improved. 4. Permanent atrial fibrillation with RVR- slight  improvement in rate with addition of Amiodarone.  Will transition to po Bisoprolol.  Defer Digoxin and Cardizem dosing to Cardiology (can probably transition to oral treatments soon). 5. CAD (coronary artery disease)-Continue medical therapy.  6. Type 2 diabetes mellitus-steroid induced hyperglycemia despite addition/increase in Lantus. Increase to 20 units BID and continue SSI. Should improve with decrease in steroid dose. 7. Chronic anticoagulation- coumadin per pharmacy  8. E.coli UTI- discussed with pharmacy- Imipenem is preferred tx for ESBL (changed yesterday).  Day 2/7.  9. Hyponatremia- hold diuretics and monitor. 10. Ethics- Appreciate Palliative Care assistance.  Continue palliative care with desire for ongoing medical treatment and hospitalization for acute issues as long as treatment is helping.    9. Dispo- anticipate discharge to SNF rehab with Palliative Care services when stable (anticipate early next week)- PT/OT consults, OOB, SW consults.     LOS: 4 days   [name],[name] 04/08/2012, 7:31 AM

## 2012-04-08 NOTE — Progress Notes (Signed)
Patient [name]      DOB: June 29, 1928      VHQ:[id number]   Palliative Medicine Team at [facility] Progress Note    Subjective: "I am just to weak to get out of bed"   Filed Vitals:   04/08/12 1500  BP: 110/65  Pulse: 45  Temp: 98.4 F (36.9 C)  Resp: 26   Physical exam:  General: Alert/oriented, mild dyspnea at rest  HEENT: Anicteric, mouth moist  Chest: Scattered rhonchi, inspirational wheezes  CVS: tachy, rhythm irregular  Abdomen:soft, non-tender, audible bowel sounds  Ext: no edema or mottling   Assessment and plan: Patient is an 78 yo WM with history of COPD, CAD, CHF, A-fib s/p pacer that was admitted 04/04/12 with cough and dyspnea. Initially required BIPAP. Admitted and treated for COPD exacerbation and atrial fibrillation with RVR   1) Code Status: DNR/DNI  2) Dyspnea: per PT patients ability to participate hampered by dyspnea. Encouraged patient and staff to administer Roxinol prior to activity as intervention for dyspnea. Patient agreed to request for relieve of dyspnea. 02 via [place] at 4 liters.  3) Disposition: SNF for rehabilitation with Palliative care Services to follow  Time In Time Out Total Time Spent with Patient Total Overall Time  2:30p 2:45p 15 min 15 min   Greater than 50%  of this time was spent counseling and coordinating care related to the above assessment and plan.  Freddie Breech, CNS-C Palliative Medicine Team [facility] Health Team Phone: [phone] Pager: [phone]

## 2012-04-09 DIAGNOSIS — E871 Hypo-osmolality and hyponatremia: Secondary | ICD-10-CM | POA: Diagnosis not present

## 2012-04-09 DIAGNOSIS — B9620 Unspecified Escherichia coli [E. coli] as the cause of diseases classified elsewhere: Secondary | ICD-10-CM | POA: Diagnosis present

## 2012-04-09 LAB — CBC
Hemoglobin: 14.6 g/dL (ref 13.0–17.0)
MCH: 29.9 pg (ref 26.0–34.0)
RBC: 4.88 MIL/uL (ref 4.22–5.81)

## 2012-04-09 LAB — BASIC METABOLIC PANEL
CO2: 39 mEq/L — ABNORMAL HIGH (ref 19–32)
Glucose, Bld: 141 mg/dL — ABNORMAL HIGH (ref 70–99)
Potassium: 4.3 mEq/L (ref 3.5–5.1)
Sodium: 135 mEq/L (ref 135–145)

## 2012-04-09 LAB — PROTIME-INR
INR: 3.19 — ABNORMAL HIGH (ref 0.00–1.49)
Prothrombin Time: 33.2 seconds — ABNORMAL HIGH (ref 11.6–15.2)

## 2012-04-09 LAB — GLUCOSE, CAPILLARY: Glucose-Capillary: 81 mg/dL (ref 70–99)

## 2012-04-09 MED ORDER — INSULIN ASPART 100 UNIT/ML ~~LOC~~ SOLN
4.0000 [IU] | Freq: Three times a day (TID) | SUBCUTANEOUS | Status: DC
  Administered 2012-04-09: 4 [IU] via SUBCUTANEOUS
  Administered 2012-04-09: 100 [IU] via SUBCUTANEOUS
  Administered 2012-04-09 – 2012-04-12 (×8): 4 [IU] via SUBCUTANEOUS

## 2012-04-09 MED ORDER — PREDNISONE 20 MG PO TABS
40.0000 mg | ORAL_TABLET | Freq: Every day | ORAL | Status: DC
  Administered 2012-04-10 – 2012-04-12 (×3): 40 mg via ORAL
  Filled 2012-04-09 (×4): qty 2

## 2012-04-09 NOTE — Progress Notes (Signed)
Subjective: Feels better but very weak.  Difficulty getting up to chair.  Objective: Vital signs in last 24 hours: Temp:  [97 F (36.1 C)-98.6 F (37 C)] 97.7 F (36.5 C) (07/25 0500) Pulse Rate: (89-122), 93 Resp:  [13-28] 16  (07/25 0500) BP: (93-126)/(38-67) 94/46 mmHg (07/25 0500) SpO2:  [85 %-100 %] 99 % (07/25 0500) Weight:  [70.1 kg (154 lb 8.7 oz)] 70.1 kg (154 lb 8.7 oz) (07/25 0000) Weight change:  Last BM Date: 04/07/12  CBG (last 3)   Basename 04/08/12 2204 04/08/12 1613 04/08/12 1125  GLUCAP 242* 276* 311*    Intake/Output from previous day: 07/24 0701 - 07/25 0700 In: 990 [P.O.:360; I.V.:230; IV Piggyback:400] Out: 1961 [Urine:1960; Stool:1] Intake/Output this shift:    General appearance: alert, mild tachypnea, frail status Eyes: no scleral icterus Throat: oropharynx moist without erythema Neck: mild JVD Resp: clear to auscultation bilaterally Cardio: irregularly irregular rhythm GI: soft, non-tender; bowel sounds normal; no masses,  no organomegaly Extremities: no clubbing, cyanosis or edema, warm   Lab Results:  [facility] 04/09/12 0310 04/08/12 0310  NA 135 126*  K 4.3 3.7  CL 91* 82*  CO2 39* 36*  GLUCOSE 141* 280*  BUN 20 23  CREATININE 0.61 0.59  CALCIUM 8.7 8.3*  MG -- --  PHOS -- --   No results found for this basename: AST:2,ALT:2,ALKPHOS:2,BILITOT:2,PROT:2,ALBUMIN:2 in the last 72 hours  Basename 04/09/12 0310 04/08/12 0310  WBC 11.0* 8.8  NEUTROABS -- --  HGB 14.6 14.0  HCT 42.9 40.8  MCV 87.9 87.4  PLT 148* 131*   Lab Results  Component Value Date   INR 3.19* 04/09/2012   INR 2.67* 04/08/2012   INR 2.28* 04/07/2012   No results found for this basename: CKTOTAL:3,CKMB:3,CKMBINDEX:3,TROPONINI:3 in the last 72 hours  Basename 04/08/12 0310  TSH 0.405  T4TOTAL --  T3FREE --  THYROIDAB --   No results found for this basename: VITAMINB12:2,FOLATE:2,FERRITIN:2,TIBC:2,IRON:2,RETICCTPCT:2 in the last 72  hours  Studies/Results: No results found.   Medications: Scheduled:   . amiodarone  200 mg Oral BID  . antiseptic oral rinse  15 mL Mouth Rinse q12n4p  . atorvastatin  10 mg Oral q1800  . bisoprolol  10 mg Oral Daily  . budesonide  0.5 mg Nebulization BID  . chlorhexidine  15 mL Mouth Rinse BID  . Chlorhexidine Gluconate Cloth  6 each Topical Q0600  . clopidogrel  75 mg Oral Q breakfast  . digoxin  0.125 mg Oral Daily  . diltiazem  360 mg Oral Daily  . feeding supplement  237 mL Oral BID BM  . imipenem-cilastatin  500 mg Intravenous Q6H  . insulin aspart  0-15 Units Subcutaneous TID WC  . insulin glargine  20 Units Subcutaneous BID  . ipratropium  0.5 mg Nebulization Q6H  . levalbuterol  0.63 mg Nebulization Q6H  . mupirocin ointment  1 application Nasal BID  . potassium gluconate  595 mg Oral TID  . predniSONE  60 mg Oral QAC breakfast  . senna-docusate  1 tablet Oral QHS  . sodium chloride  3 mL Intravenous Q12H  . warfarin  2.5 mg Oral ONCE-1800  . Warfarin - Pharmacist Dosing Inpatient   Does not apply q1800  . DISCONTD: digoxin  0.25 mg Oral Daily  . DISCONTD: furosemide  40 mg Intravenous Daily  . DISCONTD: insulin glargine  15 Units Subcutaneous BID  . DISCONTD: metoprolol  5 mg Intravenous Q8H  . DISCONTD: spironolactone  50 mg Oral Daily  Continuous:   . 0.9 % NaCl with KCl 20 mEq / L 10 mL (04/04/12 1628)  . DISCONTD: diltiazem (CARDIZEM) infusion 20 mg/hr (04/08/12 0800)    Assessment/Plan: Principal Problem:  1. *Acute-on-chronic respiratory failure- multifactorial due to COPD exacerbation, Afib with RVR and Acute on Chronic Systolic CHF. Continued slow improvement. Continue scheduled nebs, Pulmicort, slow steroid taper, antibiotics- will likely keep on maintenance dose of steroids due to severity of COPD and frequent exacerbations. Wean O2 as tolerated.   Active Problems:  2. COPD exacerbation- continue treatment as above.  Decrease Prednisone to 40mg   tomorrow if stable.  3. Acute on Chronic CHF (congestive heart failure)- EF 40-45%- improved.  Lasix on hold due to hyponatremia and mild volume depletion.  May need to resume over the weekend.  Daily weights and I/Os. 4. Permanent atrial fibrillation with RVR s/p pacemaker placement- rate improved with addition of Amiodarone.  Continue Cardizem, Bisoprolol, Digoxin orally. 5. CAD (coronary artery disease)-Continue medical therapy.  6. Type 2 diabetes mellitus-steroid induced hyperglycemia improving.  Add prandial Novolog 4 units qAC. 7. Chronic anticoagulation- coumadin per pharmacy  8. E.coli UTI (ESBL)- continue Imipenem. Day 3/7.  9. Hyponatremia- resolved with holding diuretics.  Encouraged pos. 10. Ethics-Continue palliative care with desire for ongoing medical treatment and hospitalization for acute issues as long as treatment is helping.  11. Dispo- anticipate discharge to [facility] SN[facility] rehab with Palliative Care services when stable (anticipate Monday)- Transfer to telemetry bed.  Continue PT/OT.  Out of bed today.  Discontinue foley.    LOS: 5 days   [name],[name] 04/09/2012, 7:22 AM

## 2012-04-09 NOTE — Clinical Social Work Note (Signed)
CSW continues to follow for return to SNF. FL2 has been on chart and needs signatures to assist with return to SNF.  Pt has bed at [facility] for return when ready.   [name], LCSWA Clinical Social Worker [facility] [phone]

## 2012-04-09 NOTE — Progress Notes (Signed)
Patient [name]      DOB: 31-Mar-1928      NWG:[id number]   Palliative Medicine Team at [facility] Progress Note    Subjective: Patient in good spirits, dyspnea limiting activity. Looking forward to being discharged to [facility] in next few days   Filed Vitals:   04/09/12 1200  BP: 111/69  Pulse: 116  Temp:   Resp: 15   Physical exam:  General: Alert/oriented, mild dyspnea at rest  HEENT: Anicteric, mouth moist  Chest: Scattered rhonchi, inspirational wheezes  CVS: tachy, rhythm irregular  Abdomen:soft, non-tender, audible bowel sounds  Ext: no edema or mottling   Assessment and plan:  Patient is an 78 yo WM with history of COPD, CAD, CHF, A-fib s/p pacer that was admitted 04/04/12 with cough and dyspnea. Initially required BIPAP. Admitted and treated for COPD exacerbation and atrial fibrillation with RVR  1) Code Status: DNR/DNI  2) Dyspnea: patient agreeable to taking dose of Roxinol, tolerated, stated he would ask for it especially prior to activity 3) Disposition: SNF for rehabilitation with Palliative care Services to follow   Time In Time Out Total Time Spent with Patient Total Overall Time  12:30p 1:00p 30 min 30 min   Greater than 50%  of this time was spent counseling and coordinating care related to the above assessment and plan.  Freddie Breech, CNS-C Palliative Medicine Team [facility] Team Phone: [phone] Pager: [phone]

## 2012-04-09 NOTE — Progress Notes (Signed)
ANTICOAGULATION CONSULT NOTE - Follow Up Consult  Pharmacy Consult for Coumadin Indication: atrial fibrillation  No Known Allergies  Patient Measurements: Height: 5\' 9"  (175.3 cm) Weight: 154 lb 8.7 oz (70.1 kg) IBW/kg (Calculated) : 70.7    Vital Signs: Temp: 97.7 F (36.5 C) (07/25 0500) BP: 94/46 mmHg (07/25 0500) Pulse Rate: 87  (07/25 0700)  Labs:  Basename 04/09/12 0310 04/08/12 1015 04/08/12 0310 04/07/12 0310  HGB 14.6 -- 14.0 --  HCT 42.9 -- 40.8 40.5  PLT 148* -- 131* 142*  APTT -- -- -- --  LABPROT 33.2* 28.9* -- 25.5*  INR 3.19* 2.67* -- 2.28*  HEPARINUNFRC -- -- -- --  CREATININE 0.61 -- 0.59 0.69  CKTOTAL -- -- -- --  CKMB -- -- -- --  TROPONINI -- -- -- --    Estimated Creatinine Clearance: 71.8 ml/min (by C-G formula based on Cr of 0.61).   Medications:  Scheduled:     . amiodarone  200 mg Oral BID  . antiseptic oral rinse  15 mL Mouth Rinse q12n4p  . atorvastatin  10 mg Oral q1800  . bisoprolol  10 mg Oral Daily  . budesonide  0.5 mg Nebulization BID  . chlorhexidine  15 mL Mouth Rinse BID  . Chlorhexidine Gluconate Cloth  6 each Topical Q0600  . clopidogrel  75 mg Oral Q breakfast  . digoxin  0.125 mg Oral Daily  . diltiazem  360 mg Oral Daily  . feeding supplement  237 mL Oral BID BM  . imipenem-cilastatin  500 mg Intravenous Q6H  . insulin aspart  0-15 Units Subcutaneous TID WC  . insulin aspart  4 Units Subcutaneous TID WC  . insulin glargine  20 Units Subcutaneous BID  . ipratropium  0.5 mg Nebulization Q6H  . levalbuterol  0.63 mg Nebulization Q6H  . mupirocin ointment  1 application Nasal BID  . potassium gluconate  595 mg Oral TID  . predniSONE  60 mg Oral QAC breakfast  . senna-docusate  1 tablet Oral QHS  . sodium chloride  3 mL Intravenous Q12H  . warfarin  2.5 mg Oral ONCE-1800  . Warfarin - Pharmacist Dosing Inpatient   Does not apply q1800  . DISCONTD: digoxin  0.25 mg Oral Daily   Infusions:     . 0.9 % NaCl with  KCl 20 mEq / L 10 mL (04/04/12 1628)  . DISCONTD: diltiazem (CARDIZEM) infusion 20 mg/hr (04/08/12 0800)    Assessment:  83 YOM on chronic coumadin for Afib, admitted with acute-on-chronic respiratory failure- multifactorial due to COPD exacerbation, Afib with RVR and CHF  Dose per [place] clinic notes is 5mg  daily except 2.5mg  Mon and Fri  Dose not documented as given 7/20, dose was not ordered 7/21, 5mg  give 7/22, 2.5mg  on 7/23 and 7/24  INR jumped up and is now supratherapeutic this am.  No bleeding reported in chart notes.  Now that amiodarone has started, will need to monitor for long-term interaction with coumadin (increased INR); amio can also result in elevated digoxin levels (1.2 on 7/20), consider decreasing dig dose by 50% (done) and recheck level at new steady state.  Goal of Therapy:  INR 2-3 Monitor platelets by anticoagulation protocol: Yes   Plan:   No warfarin tonight  Daily PT/INR  Darrol Angel, PharmD Pager: [phone] 04/09/2012,8:26 AM

## 2012-04-09 NOTE — Progress Notes (Signed)
Subjective:  The patient has improved since yesterday.  Heart rate below 100 on oral meds.  Occasional paced ventricular beats.  No chest pain.  Dyspnea is improved.  Objective:  Vital Signs in the last 24 hours: Temp:  [97.2 F (36.2 C)-98.6 F (37 C)] 97.7 F (36.5 C) (07/25 0500) Pulse Rate:  [33-122] 87  (07/25 0700) Resp:  [13-28] 16  (07/25 0500) BP: (93-126)/(38-67) 94/46 mmHg (07/25 0500) SpO2:  [85 %-100 %] 99 % (07/25 0500) Weight:  [154 lb 8.7 oz (70.1 kg)] 154 lb 8.7 oz (70.1 kg) (07/25 0000)  Intake/Output from previous day: 07/24 0701 - 07/25 0700 In: 990 [P.O.:360; I.V.:230; IV Piggyback:400] Out: 1961 [Urine:1960; Stool:1] Intake/Output from this shift:       . amiodarone  200 mg Oral BID  . antiseptic oral rinse  15 mL Mouth Rinse q12n4p  . atorvastatin  10 mg Oral q1800  . bisoprolol  10 mg Oral Daily  . budesonide  0.5 mg Nebulization BID  . chlorhexidine  15 mL Mouth Rinse BID  . Chlorhexidine Gluconate Cloth  6 each Topical Q0600  . clopidogrel  75 mg Oral Q breakfast  . digoxin  0.125 mg Oral Daily  . diltiazem  360 mg Oral Daily  . feeding supplement  237 mL Oral BID BM  . imipenem-cilastatin  500 mg Intravenous Q6H  . insulin aspart  0-15 Units Subcutaneous TID WC  . insulin aspart  4 Units Subcutaneous TID WC  . insulin glargine  20 Units Subcutaneous BID  . ipratropium  0.5 mg Nebulization Q6H  . levalbuterol  0.63 mg Nebulization Q6H  . mupirocin ointment  1 application Nasal BID  . potassium gluconate  595 mg Oral TID  . predniSONE  60 mg Oral QAC breakfast  . senna-docusate  1 tablet Oral QHS  . sodium chloride  3 mL Intravenous Q12H  . warfarin  2.5 mg Oral ONCE-1800  . Warfarin - Pharmacist Dosing Inpatient   Does not apply q1800  . DISCONTD: digoxin  0.25 mg Oral Daily      . 0.9 % NaCl with KCl 20 mEq / L 10 mL (04/04/12 1628)  . DISCONTD: diltiazem (CARDIZEM) infusion 20 mg/hr (04/08/12 0800)    Physical Exam: The patient  appears to be in no distress, looking forward to breakfast.  Head and neck exam reveals that the pupils are equal and reactive.  The extraocular movements are full.  There is no scleral icterus.  Mouth and pharynx are benign.  No lymphadenopathy.  No carotid bruits.  The jugular venous pressure is normal.  Thyroid is not enlarged or tender.  Chest reveals bilateral coarse rhonchi partially clear with cough.  Heart reveals no abnormal lift or heave.  First and second heart sounds are normal.  There is no murmur gallop rub or click. Pulse is irregular.  The abdomen is soft and nontender.  Bowel sounds are normoactive.  There is no hepatosplenomegaly or mass.  There are no abdominal bruits.  Extremities reveal no phlebitis or edema.  Pedal pulses are good.  There is no cyanosis or clubbing.  Neurologic exam is normal strength and no lateralizing weakness.  No sensory deficits.  Integument reveals no rash  Lab Results:  Basename 04/09/12 0310 04/08/12 0310  WBC 11.0* 8.8  HGB 14.6 14.0  PLT 148* 131*    Basename 04/09/12 0310 04/08/12 0310  NA 135 126*  K 4.3 3.7  CL 91* 82*  CO2 39* 36*  GLUCOSE 141* 280*  BUN 20 23  CREATININE 0.61 0.59   No results found for this basename: TROPONINI:2,CK,MB:2 in the last 72 hours Hepatic Function Panel No results found for this basename: PROT,ALBUMIN,AST,ALT,ALKPHOS,BILITOT,BILIDIR,IBILI in the last 72 hours No results found for this basename: CHOL in the last 72 hours No results found for this basename: PROTIME in the last 72 hours  Imaging: Imaging results have been reviewed  Cardiac Studies:  Assessment/Plan:  1. End stage COPD 2. Chronic atrial fib with rapid ventricular response. 3. Chronic systolic CHF EF 40-45%  Plan: Continue present anti-arrhythmic drugs.  Agree with transfer to floor/telemetry today.  LOS: 5 days    Cassell Clement 04/09/2012, 8:01 AM

## 2012-04-10 LAB — GLUCOSE, CAPILLARY: Glucose-Capillary: 333 mg/dL — ABNORMAL HIGH (ref 70–99)

## 2012-04-10 LAB — CULTURE, BLOOD (ROUTINE X 2): Culture: NO GROWTH

## 2012-04-10 LAB — BASIC METABOLIC PANEL
CO2: 34 mEq/L — ABNORMAL HIGH (ref 19–32)
Calcium: 8.5 mg/dL (ref 8.4–10.5)
Creatinine, Ser: 0.58 mg/dL (ref 0.50–1.35)
Glucose, Bld: 98 mg/dL (ref 70–99)
Sodium: 132 mEq/L — ABNORMAL LOW (ref 135–145)

## 2012-04-10 LAB — PROTIME-INR: Prothrombin Time: 29 seconds — ABNORMAL HIGH (ref 11.6–15.2)

## 2012-04-10 LAB — CBC
Hemoglobin: 14.2 g/dL (ref 13.0–17.0)
MCH: 30.2 pg (ref 26.0–34.0)
MCV: 89.1 fL (ref 78.0–100.0)
RBC: 4.7 MIL/uL (ref 4.22–5.81)

## 2012-04-10 MED ORDER — IPRATROPIUM BROMIDE 0.02 % IN SOLN
0.5000 mg | Freq: Four times a day (QID) | RESPIRATORY_TRACT | Status: DC
  Administered 2012-04-11 – 2012-04-14 (×13): 0.5 mg via RESPIRATORY_TRACT
  Filled 2012-04-10 (×13): qty 2.5

## 2012-04-10 MED ORDER — LEVALBUTEROL HCL 0.63 MG/3ML IN NEBU
0.6300 mg | INHALATION_SOLUTION | Freq: Four times a day (QID) | RESPIRATORY_TRACT | Status: DC
  Administered 2012-04-11 – 2012-04-14 (×13): 0.63 mg via RESPIRATORY_TRACT
  Filled 2012-04-10 (×18): qty 3

## 2012-04-10 MED ORDER — WARFARIN SODIUM 2.5 MG PO TABS
2.5000 mg | ORAL_TABLET | Freq: Once | ORAL | Status: AC
  Administered 2012-04-10: 2.5 mg via ORAL
  Filled 2012-04-10: qty 1

## 2012-04-10 NOTE — Progress Notes (Signed)
Palliative Medicine Team SW Follow up psychosocial visit with pt. Pt expressed some anxiety about scheduled use of morphine because of side effects. Pt reports concern about constipation as a side effect (though he reports good bm today with help of softener/laxative) as well as a fear of hallucinatory effect, which he reports he experienced the very first time he ever used morphine. Alerted RN and encouraged pt to express his concerns and weigh out the cost/benefits with medical team.   Pt and I engaged in brief life review. Pt eager to share stories of his childhood as he worries about his increased difficulty with short term memory whilst his long term memories are quite vivid. Pt also open in discussion about his continued goal to rehab and his concerns about becoming/being deconditioned. Pt reports attempts to stretch and move his extremities while in bed. Affirmed pt's positive disposition and intact sense of humor. Pt thankful for visit.   Kennieth Francois, [place] Pager [phone]

## 2012-04-10 NOTE — Progress Notes (Signed)
Subjective: Feels a bit better. Ate great at breakfast. Sat up a bit yesterday but tired easily. Some dry cough. No CP. Backside is a bit sore. Slept OK   Objective: Vital signs in last 24 hours: Temp:  [96.7 F (35.9 C)-98 F (36.7 C)] 96.7 F (35.9 C) (07/26 0547) Pulse Rate:  [64-116] 69  (07/26 0547) Resp:  [15-32] 22  (07/26 0547) BP: (111-138)/(62-75) 116/75 mmHg (07/26 0547) SpO2:  [94 %-99 %] 95 % (07/26 0547) Weight:  [69.6 kg (153 lb 7 oz)-69.7 kg (153 lb 10.6 oz)] 69.6 kg (153 lb 7 oz) (07/26 0547)  Intake/Output from previous day: 07/25 0701 - 07/26 0700 In: 1490 [P.O.:1020; I.V.:70; IV Piggyback:400] Out: 1115 [Urine:1115] Intake/Output this shift: Total I/O In: -  Out: 175 [Urine:175]  General: alert, sitting up with O2 in place.face symmetric. Lungs distant but no wheeze, no accessory ms in use. Ht irregular distant. abd soft NT, no periph edema.Neuro: alert, awake, mentating well  Lab Results   [facility] 04/10/12 0340 04/09/12 0310  WBC 10.3 11.0*  RBC 4.70 4.88  HGB 14.2 14.6  HCT 41.9 42.9  MCV 89.1 87.9  MCH 30.2 29.9  RDW 12.6 12.4  PLT 145* 148*    Basename 04/10/12 0340 04/09/12 0310  NA 132* 135  K 4.6 4.3  CL 94* 91*  CO2 34* 39*  GLUCOSE 98 141*  BUN 23 20  CREATININE 0.58 0.61  CALCIUM 8.5 8.7    Studies/Results: No results found.  Scheduled Meds:   . amiodarone  200 mg Oral BID  . antiseptic oral rinse  15 mL Mouth Rinse q12n4p  . atorvastatin  10 mg Oral q1800  . bisoprolol  10 mg Oral Daily  . budesonide  0.5 mg Nebulization BID  . chlorhexidine  15 mL Mouth Rinse BID  . Chlorhexidine Gluconate Cloth  6 each Topical Q0600  . clopidogrel  75 mg Oral Q breakfast  . digoxin  0.125 mg Oral Daily  . diltiazem  360 mg Oral Daily  . feeding supplement  237 mL Oral BID BM  . imipenem-cilastatin  500 mg Intravenous Q6H  . insulin aspart  0-15 Units Subcutaneous TID WC  . insulin aspart  4 Units Subcutaneous TID WC  . insulin  glargine  20 Units Subcutaneous BID  . ipratropium  0.5 mg Nebulization Q6H  . levalbuterol  0.63 mg Nebulization Q6H  . mupirocin ointment  1 application Nasal BID  . potassium gluconate  595 mg Oral TID  . predniSONE  40 mg Oral QAC breakfast  . senna-docusate  1 tablet Oral QHS  . sodium chloride  3 mL Intravenous Q12H  . Warfarin - Pharmacist Dosing Inpatient   Does not apply q1800  . DISCONTD: predniSONE  60 mg Oral QAC breakfast   Continuous Infusions:   . 0.9 % NaCl with KCl 20 mEq / L 10 mL (04/04/12 1628)   PRN Meds:acetaminophen, acetaminophen, alum & mag hydroxide-simeth, bisacodyl, morphine, morphine injection, nitroGLYCERIN, ondansetron (ZOFRAN) IV, ondansetron, zolpidem  Assessment/Plan:  Principal Problem:  1. *Acute-on-chronic respiratory failure- multifactorial due to COPD exacerbation, Afib with RVR and Acute on Chronic Systolic CHF. Continued slow improvement. Continue scheduled nebs, Pulmicort, slow steroid taper, antibiotics- will likely keep on maintenance dose of steroids due to severity of COPD and frequent exacerbations. Wean O2 as tolerated.  Active Problems:  2. COPD exacerbation- continue treatment as above. Will drop pred to 40.  3. Acute on Chronic CHF (congestive heart failure)- EF 40-45%- improved.  Lasix on hold due to hyponatremia and mild volume depletion. May need to resume over the weekend. Daily weights and I/Os.  4. Permanent atrial fibrillation with RVR s/p pacemaker placement- rate improved with addition of Amiodarone. Continue Cardizem, Bisoprolol, Digoxin orally.  5. CAD (coronary artery disease)-Continue medical therapy.  6. Type 2 diabetes mellitus-steroid induced hyperglycemia improving. FBS good at 89 7. Chronic anticoagulation- coumadin per pharmacy  8. E.coli UTI (ESBL)- continue Imipenem. Day 3/7.  9. Hyponatremia- back down a little, watch for now.  10. Ethics-Continue palliative care with desire for ongoing medical treatment and  hospitalization for acute issues as long as treatment is helping.  11. Dispo- anticipate discharge to [facility] rehab with Palliative Care services when stable (anticipate Monday)- Transfer to telemetry bed. Continue PT/OT.  .    LOS: 6 days   Siaosi Alter ALAN 04/10/2012, 8:21 AM

## 2012-04-10 NOTE — Progress Notes (Signed)
Unable to ambulate pt this shift due to pt being unable to bear any weight on legs.

## 2012-04-10 NOTE — Progress Notes (Signed)
ANTICOAGULATION CONSULT NOTE - Follow Up Consult  Pharmacy Consult for Coumadin Indication: atrial fibrillation  No Known Allergies  Patient Measurements: Height: 5\' 9"  (175.3 cm) Weight: 153 lb 7 oz (69.6 kg) IBW/kg (Calculated) : 70.7    Vital Signs: Temp: 96.7 F (35.9 C) (07/26 0547) Temp src: Oral (07/26 0547) BP: 125/77 mmHg (07/26 0943) Pulse Rate: 69  (07/26 0547)  Labs:  Basename 04/10/12 0340 04/09/12 0310 04/08/12 1015 04/08/12 0310  HGB 14.2 14.6 -- --  HCT 41.9 42.9 -- 40.8  PLT 145* 148* -- 131*  APTT -- -- -- --  LABPROT 29.0* 33.2* 28.9* --  INR 2.69* 3.19* 2.67* --  HEPARINUNFRC -- -- -- --  CREATININE 0.58 0.61 -- 0.59  CKTOTAL -- -- -- --  CKMB -- -- -- --  TROPONINI -- -- -- --    Estimated Creatinine Clearance: 71.3 ml/min (by C-G formula based on Cr of 0.58).   Medications:  Scheduled:     . amiodarone  200 mg Oral BID  . antiseptic oral rinse  15 mL Mouth Rinse q12n4p  . atorvastatin  10 mg Oral q1800  . bisoprolol  10 mg Oral Daily  . budesonide  0.5 mg Nebulization BID  . chlorhexidine  15 mL Mouth Rinse BID  . Chlorhexidine Gluconate Cloth  6 each Topical Q0600  . clopidogrel  75 mg Oral Q breakfast  . digoxin  0.125 mg Oral Daily  . diltiazem  360 mg Oral Daily  . feeding supplement  237 mL Oral BID BM  . imipenem-cilastatin  500 mg Intravenous Q6H  . insulin aspart  0-15 Units Subcutaneous TID WC  . insulin aspart  4 Units Subcutaneous TID WC  . insulin glargine  20 Units Subcutaneous BID  . ipratropium  0.5 mg Nebulization Q6H  . levalbuterol  0.63 mg Nebulization Q6H  . potassium gluconate  595 mg Oral TID  . predniSONE  40 mg Oral QAC breakfast  . senna-docusate  1 tablet Oral QHS  . sodium chloride  3 mL Intravenous Q12H  . Warfarin - Pharmacist Dosing Inpatient   Does not apply q1800   Infusions:     . 0.9 % NaCl with KCl 20 mEq / L 10 mL (04/04/12 1628)    Assessment:  83 YOM on chronic coumadin for Afib,  admitted with acute-on-chronic respiratory failure- multifactorial due to COPD exacerbation, Afib with RVR and CHF  Dose per [place] clinic notes is 5mg  daily except 2.5mg  Mon and Fri  INR trended down to within normal range today after holding coumadin last night secondary to supratherapeutic INR (3.19)  No Bleeding reported in chart notes.  Now that amiodarone has started, will need to monitor for long-term interaction with coumadin (increased INR); amio can also result in elevated digoxin levels (1.2 on 7/20), consider decreasing dig dose by 50% (done) and recheck level at new steady state (would check on 7/29)  Goal of Therapy:  INR 2-3 Monitor platelets by anticoagulation protocol: Yes   Plan:   Coumadin 2.5 mg po x 1 tonight at 1800   Daily PT/INR  Dory Verdun, Loma Messing PharmD 1:52 PM 04/10/2012

## 2012-04-10 NOTE — Progress Notes (Signed)
ANTIBIOTIC CONSULT NOTE - INITIAL  Pharmacy Consult for Primaxin Indication: PNA, E.coli UTI, +ESBL  No Known Allergies  Patient Measurements: Height: 5\' 9"  (175.3 cm) Weight: 153 lb 7 oz (69.6 kg) IBW/kg (Calculated) : 70.7    Vital Signs: Temp: 96.7 F (35.9 C) (07/26 0547) Temp src: Oral (07/26 0547) BP: 125/77 mmHg (07/26 0943) Pulse Rate: 69  (07/26 0547) Intake/Output from previous day: 07/25 0701 - 07/26 0700 In: 1490 [P.O.:1020; I.V.:70; IV Piggyback:400] Out: 1115 [Urine:1115] Intake/Output from this shift: Total I/O In: 700 [P.O.:600; IV Piggyback:100] Out: 400 [Urine:400]  Labs:  [facility] 04/10/12 0340 04/09/12 0310 04/08/12 0310  WBC 10.3 11.0* 8.8  HGB 14.2 14.6 14.0  PLT 145* 148* 131*  LABCREA -- -- --  CREATININE 0.58 0.61 0.59   Estimated Creatinine Clearance: 71.3 ml/min (by C-G formula based on Cr of 0.58). and normalized formula   Microbiology: Recent Results (from the past 720 hour(s))  URINE CULTURE     Status: Normal   Collection Time   04/04/12  9:25 AM      Component Value Range Status Comment   Specimen Description URINE, CATHETERIZED   Final    Special Requests NONE   Final    Culture  Setup Time 04/04/2012 16:04   Final    Colony Count >=100,000 COLONIES/ML   Final    Culture     Final    Value: ESCHERICHIA COLI     Note: Confirmed Extended Spectrum Beta-Lactamase Producer (ESBL) CRITICAL RESULT CALLED TO, READ BACK BY AND VERIFIED WITH: CHERYL D AT 0446 04/07/12 BY SNOLO   Report Status 04/07/2012 FINAL   Final    Organism ID, Bacteria ESCHERICHIA COLI   Final   CULTURE, BLOOD (ROUTINE X 2)     Status: Normal   Collection Time   04/04/12  9:56 AM      Component Value Range Status Comment   Specimen Description BLOOD RIGHT ARM   Final    Special Requests BOTTLES DRAWN AEROBIC AND ANAEROBIC 5CC EACH   Final    Culture  Setup Time 04/04/2012 16:04   Final    Culture NO GROWTH 5 DAYS   Final    Report Status 04/10/2012 FINAL   Final    CULTURE, BLOOD (ROUTINE X 2)     Status: Normal   Collection Time   04/04/12  9:56 AM      Component Value Range Status Comment   Specimen Description BLOOD RIGHT HAND   Final    Special Requests BOTTLES DRAWN AEROBIC AND ANAEROBIC [facility] EACH   Final    Culture  Setup Time 04/04/2012 16:04   Final    Culture NO GROWTH 5 DAYS   Final    Report Status 04/10/2012 FINAL   Final   MRSA PCR SCREENING     Status: Abnormal   Collection Time   04/04/12  6:23 PM      Component Value Range Status Comment   MRSA by PCR POSITIVE (*) NEGATIVE Final   CULTURE, EXPECTORATED SPUTUM-ASSESSMENT     Status: Normal   Collection Time   04/04/12  6:26 PM      Component Value Range Status Comment   Specimen Description SPUTUM   Final    Special Requests Normal   Final    Sputum evaluation     Final    Value: THIS SPECIMEN IS ACCEPTABLE. RESPIRATORY CULTURE REPORT TO FOLLOW.   Report Status 04/04/2012 FINAL   Final   CULTURE, RESPIRATORY  Status: Normal   Collection Time   04/04/12  6:26 PM      Component Value Range Status Comment   Specimen Description SPUTUM   Final    Special Requests NONE   Final    Gram Stain     Final    Value: NO WBC SEEN     NO SQUAMOUS EPITHELIAL CELLS SEEN     RARE GRAM NEGATIVE COCCOBACILLI   Culture     Final    Value: ABUNDANT HAEMOPHILUS INFLUENZAE     Note: BETA LACTAMASE NEGATIVE   Report Status 04/07/2012 FINAL   Final     Medical History: Past Medical History  Diagnosis Date  . Pneumonia 12/06/10    healthcare-associated/ left lower lobe  . COPD (chronic obstructive pulmonary disease)     severe stage IV  . Atrial fibrillation     on Coumadin with [employer] single-chamber pacemaker  . Diabetes mellitus     type 2; s/p Prednisone therapy for pneumonia 12/2010  . Coronary artery disease     s/p anterior STEMI 09/2009; cath. revealed mid LAD 40%, distal LAD 100%- PTCA, prox. RCA 40%, mid. RCA 100% with good collateral filling of PDA; EF 25%; NSTEMI 07/2011 -  PCI/DES 100% LCX  . ST elevation (STEMI) myocardial infarction 01//11/12    anterior  . CHF (congestive heart failure)     EF 25% on 09/26/10; EF 50-55% and grade 1 diastolic dysfunction on ECHO 08/2010   . Hypertension   . Hyperlipidemia   . AAA (abdominal aortic aneurysm)     3 cm infrarenal abdominal aortic aneurysm per aorta ultrasound 03/2010  . Osteoarthritis     s/p bilat hip arthroplasty  . Angina   . Ischemic cardiomyopathy     EF 35% LHC 11/12  . GERD (gastroesophageal reflux disease)   . Hypercholesteremia   . PVD (peripheral vascular disease)   . Anxiety     Medications:  Scheduled:     . amiodarone  200 mg Oral BID  . antiseptic oral rinse  15 mL Mouth Rinse q12n4p  . atorvastatin  10 mg Oral q1800  . bisoprolol  10 mg Oral Daily  . budesonide  0.5 mg Nebulization BID  . chlorhexidine  15 mL Mouth Rinse BID  . Chlorhexidine Gluconate Cloth  6 each Topical Q0600  . clopidogrel  75 mg Oral Q breakfast  . digoxin  0.125 mg Oral Daily  . diltiazem  360 mg Oral Daily  . feeding supplement  237 mL Oral BID BM  . imipenem-cilastatin  500 mg Intravenous Q6H  . insulin aspart  0-15 Units Subcutaneous TID WC  . insulin aspart  4 Units Subcutaneous TID WC  . insulin glargine  20 Units Subcutaneous BID  . ipratropium  0.5 mg Nebulization Q6H  . levalbuterol  0.63 mg Nebulization Q6H  . potassium gluconate  595 mg Oral TID  . predniSONE  40 mg Oral QAC breakfast  . senna-docusate  1 tablet Oral QHS  . sodium chloride  3 mL Intravenous Q12H  . warfarin  2.5 mg Oral ONCE-1800  . Warfarin - Pharmacist Dosing Inpatient   Does not apply q1800   Infusions:     . 0.9 % NaCl with KCl 20 mEq / L 10 mL (04/04/12 1628)   Assessment:  78 year-old SNF resident with E.Coli + ESBL producing UTI on D4 Primaxin.  Currently, carbapenems are the drugs of choice for ESBL producing organisms  Sputum culture with abundant  Heemophilis influenzae - No sensitives reported but  Carbapenems cover H.Flu.   Renal function stable, WBC wnl  Goal of Therapy:  Eradication of infection Primaxin dosing based on weight/renal function  Plan:   Primaxin 500mg  IV q6h Follow up renal function & cultures Follow up duration of therapy   Izabel Chim, Loma Messing PharmD 2:07 PM 04/10/2012

## 2012-04-10 NOTE — Progress Notes (Signed)
Subjective:  The patient is feeling better. Now on telemetry floor. Heart rate 80-90 atrial fib  Objective:  Vital Signs in the last 24 hours: Temp:  [96.7 F (35.9 C)-98 F (36.7 C)] 96.7 F (35.9 C) (07/26 0547) Pulse Rate:  [64-116] 69  (07/26 0547) Resp:  [15-32] 22  (07/26 0547) BP: (111-138)/(62-75) 116/75 mmHg (07/26 0547) SpO2:  [94 %-99 %] 95 % (07/26 0547) Weight:  [153 lb 7 oz (69.6 kg)-153 lb 10.6 oz (69.7 kg)] 153 lb 7 oz (69.6 kg) (07/26 0547)  Intake/Output from previous day: 07/25 0701 - 07/26 0700 In: 1490 [P.O.:1020; I.V.:70; IV Piggyback:400] Out: 1115 [Urine:1115] Intake/Output from this shift: Total I/O In: 340 [P.O.:340] Out: 175 [Urine:175]     . amiodarone  200 mg Oral BID  . antiseptic oral rinse  15 mL Mouth Rinse q12n4p  . atorvastatin  10 mg Oral q1800  . bisoprolol  10 mg Oral Daily  . budesonide  0.5 mg Nebulization BID  . chlorhexidine  15 mL Mouth Rinse BID  . Chlorhexidine Gluconate Cloth  6 each Topical Q0600  . clopidogrel  75 mg Oral Q breakfast  . digoxin  0.125 mg Oral Daily  . diltiazem  360 mg Oral Daily  . feeding supplement  237 mL Oral BID BM  . imipenem-cilastatin  500 mg Intravenous Q6H  . insulin aspart  0-15 Units Subcutaneous TID WC  . insulin aspart  4 Units Subcutaneous TID WC  . insulin glargine  20 Units Subcutaneous BID  . ipratropium  0.5 mg Nebulization Q6H  . levalbuterol  0.63 mg Nebulization Q6H  . mupirocin ointment  1 application Nasal BID  . potassium gluconate  595 mg Oral TID  . predniSONE  40 mg Oral QAC breakfast  . senna-docusate  1 tablet Oral QHS  . sodium chloride  3 mL Intravenous Q12H  . Warfarin - Pharmacist Dosing Inpatient   Does not apply q1800  . DISCONTD: predniSONE  60 mg Oral QAC breakfast      . 0.9 % NaCl with KCl 20 mEq / L 10 mL (04/04/12 1628)    Physical Exam: The patient appears to be in no distress.  Head and neck exam reveals that the pupils are equal and reactive.   The extraocular movements are full.  There is no scleral icterus.  Mouth and pharynx are benign.  No lymphadenopathy.  No carotid bruits.  The jugular venous pressure is normal.  Thyroid is not enlarged or tender.  Chest reveals bilateral coarse rhonchi partially clear with cough.  Heart reveals no abnormal lift or heave.  First and second heart sounds are normal.  There is no murmur gallop rub or click. Pulse is irregular.  The abdomen is soft and nontender.  Bowel sounds are normoactive.  There is no hepatosplenomegaly or mass.  There are no abdominal bruits.  Extremities reveal no phlebitis or edema.  Pedal pulses are good.  There is no cyanosis or clubbing.  Neurologic exam is normal strength and no lateralizing weakness.  No sensory deficits.  Integument reveals no rash  Lab Results:  Basename 04/10/12 0340 04/09/12 0310  WBC 10.3 11.0*  HGB 14.2 14.6  PLT 145* 148*    Basename 04/10/12 0340 04/09/12 0310  NA 132* 135  K 4.6 4.3  CL 94* 91*  CO2 34* 39*  GLUCOSE 98 141*  BUN 23 20  CREATININE 0.58 0.61   No results found for this basename: TROPONINI:2,CK,MB:2 in the last 72  hours Hepatic Function Panel No results found for this basename: PROT,ALBUMIN,AST,ALT,ALKPHOS,BILITOT,BILIDIR,IBILI in the last 72 hours No results found for this basename: CHOL in the last 72 hours No results found for this basename: PROTIME in the last 72 hours  Imaging: Imaging results have been reviewed  Cardiac Studies: Telemetry shows atrial fib with controlled VR.  Occ short runs of VT asymptomatic. Assessment/Plan:  1. End stage COPD 2. Chronic atrial fib with rapid ventricular response. 3. Chronic systolic CHF EF 40-45%  Plan: Continue present anti-arrhythmic drugs. Tolerating present regimen. Rate adequately controlled now.  LOS: 6 days    Cassell Clement 04/10/2012, 8:38 AM

## 2012-04-10 NOTE — Progress Notes (Signed)
Physical Therapy Treatment Patient Details Name: [name] MRN: [id number] DOB: [birth year] Today's Date: 04/10/2012 Time: 4098-1191 PT Time Calculation (min): 25 min  PT Assessment / Plan / Recommendation Comments on Treatment Session  Pt on 4lts @95 % BP 119/70, HR 75 while supine.  EOB BP 113/74, stas 97% and HR 88 with no c/o dizzyness. Pt very limited esp B LE "give way" and is unable to amb.  + 1 "bear hug" transfer tech from bed to chair. Pt plans to D/C to SNF for Rehab.    Follow Up Recommendations  Skilled nursing facility    Barriers to Discharge        Equipment Recommendations  Defer to next venue    Recommendations for Other Services    Frequency Min 3X/week   Plan Discharge plan remains appropriate    Precautions / Restrictions Precautions Precautions: Fall   Pertinent Vitals/Pain No c/o pain    Mobility  Bed Mobility Bed Mobility: Supine to Sit;Sitting - Scoot to Edge of Bed Supine to Sit: 3: Mod assist Sitting - Scoot to Edge of Bed: 4: Min guard Details for Bed Mobility Assistance: more assistance needed for B LE and increased time  Transfers Transfers: Sit to Stand;Stand to Sit Sit to Stand: 1: +1 Total assist Stand to Sit: 1: +1 Total assist Stand Pivot Transfers: 1: +1 Total assist (from bed to chair) Details for Transfer Assistance: attempted sit to stand using RW however pt unable to fully extend B LE as they buckled.  Ambulation/Gait Ambulation/Gait Assistance: Not tested (comment) (unable)     PT Goals                           progressing    Visit Information  Last PT Received On: 04/10/12 Assistance Needed: +2    Subjective Data   I feel pretty good   Cognition    good   Balance   sitting balance is fai+ Standing balance zero   End of Session PT - End of Session Equipment Utilized During Treatment: Gait belt Activity Tolerance: Patient limited by fatigue;Treatment limited secondary to medical complications  (Comment) Patient left: in chair;with call bell/phone within reach Nurse Communication: Mobility status   Felecia Shelling  PTA [facility]  Acute  Rehab Pager     [phone]

## 2012-04-10 NOTE — Progress Notes (Signed)
Pt states he has received heart failure packet in the past and chooses not to follow the guidelines. He also said has had people telling him information for the past 2-3 hours. Pt was still given a heart failure packet to read and take home.

## 2012-04-10 NOTE — Progress Notes (Addendum)
Patient [name]      DOB: May 23, 1928      ZOX:[id number]   Palliative Medicine Team at [facility] Progress Note    Subjective: "I feel better today", less congestion. Tolerated Roxinol dose yesterday, but hesitant to use. Stated it helped his dyspnea a little. Refused to have dose increased   Filed Vitals:   04/10/12 0547  BP: 116/75  Pulse: 69  Temp: 96.7 F (35.9 C)  Resp: 22   Physical exam:  General: Alert/oriented HEENT: Anicteric, mouth moist  Chest: fine expiratory wheezes, otherwise clear CVS: tachy, rhythm irregular. Occasional runs of V-tach Abdomen:soft, non-tender, audible bowel sounds  Ext: no edema or mottling  Assessment and plan:  Patient is an 78 yo WM with history of COPD, CAD, CHF, A-fib s/p pacer that was admitted 04/04/12 with cough and dyspnea. Initially required BIPAP. Admitted and treated for COPD exacerbation and atrial fibrillation with RVR   1) Code Status: DNR/DNI  2) Dyspnea: improved, encouraged patient to ask for Roxinol prior to activity 3) Disposition: Plan for discharge to [facility] SNF for rehabilitation with Palliative care Services to follow   Time In Time Out Total Time Spent with Patient Total Overall Time  9:00a  9:20a 20 min 20 min   Greater than 50%  of this time was spent counseling and coordinating care related to the above assessment and plan.  Freddie Breech, CNS-C Palliative Medicine Team [facility] Team Phone: [phone] Pager: [phone]

## 2012-04-11 LAB — CBC
HCT: 42.6 % (ref 39.0–52.0)
Hemoglobin: 14.2 g/dL (ref 13.0–17.0)
MCH: 29.8 pg (ref 26.0–34.0)
MCHC: 33.3 g/dL (ref 30.0–36.0)
MCV: 89.5 fL (ref 78.0–100.0)
RBC: 4.76 MIL/uL (ref 4.22–5.81)

## 2012-04-11 LAB — BASIC METABOLIC PANEL
BUN: 18 mg/dL (ref 6–23)
CO2: 32 mEq/L (ref 19–32)
Calcium: 8.7 mg/dL (ref 8.4–10.5)
Creatinine, Ser: 0.56 mg/dL (ref 0.50–1.35)
GFR calc non Af Amer: >90 mL/min (ref >90)
Glucose, Bld: 85 mg/dL (ref 70–99)
Sodium: 133 mEq/L — ABNORMAL LOW (ref 135–145)

## 2012-04-11 LAB — PROTIME-INR: Prothrombin Time: 21.8 seconds — ABNORMAL HIGH (ref 11.6–15.2)

## 2012-04-11 LAB — GLUCOSE, CAPILLARY
Glucose-Capillary: 58 mg/dL — ABNORMAL LOW (ref 70–99)
Glucose-Capillary: 170 mg/dL — ABNORMAL HIGH (ref 70–99)
Glucose-Capillary: 268 mg/dL — ABNORMAL HIGH (ref 70–99)

## 2012-04-11 MED ORDER — WARFARIN SODIUM 2.5 MG PO TABS
2.5000 mg | ORAL_TABLET | Freq: Once | ORAL | Status: AC
  Administered 2012-04-11: 2.5 mg via ORAL
  Filled 2012-04-11: qty 1

## 2012-04-11 MED ORDER — INSULIN GLARGINE 100 UNIT/ML ~~LOC~~ SOLN
22.0000 [IU] | Freq: Every day | SUBCUTANEOUS | Status: DC
  Administered 2012-04-12 – 2012-04-14 (×3): 22 [IU] via SUBCUTANEOUS

## 2012-04-11 MED ORDER — INSULIN GLARGINE 100 UNIT/ML ~~LOC~~ SOLN
8.0000 [IU] | Freq: Every day | SUBCUTANEOUS | Status: DC
  Administered 2012-04-11: 8 [IU] via SUBCUTANEOUS

## 2012-04-11 NOTE — Progress Notes (Signed)
   Subjective:   Heart rate 80-90 atrial fib  Objective:  Vital Signs in the last 24 hours: Temp:  [97.4 F (36.3 C)-97.8 F (36.6 C)] 97.4 F (36.3 C) (07/27 0507) Pulse Rate:  [59-76] 76  (07/27 0507) Resp:  [22-24] 22  (07/27 0507) BP: (109-125)/(66-77) 121/66 mmHg (07/27 0507) SpO2:  [94 %-98 %] 96 % (07/27 0830) Weight:  [76.2 kg (167 lb 15.9 oz)] 76.2 kg (167 lb 15.9 oz) (07/27 0500)  Intake/Output from previous day: 07/26 0701 - 07/27 0700 In: 1530 [P.O.:840; I.V.:390; IV Piggyback:300] Out: 1700 [Urine:1700] Intake/Output from this shift: Total I/O In: 358 [P.O.:358] Out: 200 [Urine:200]     . amiodarone  200 mg Oral BID  . antiseptic oral rinse  15 mL Mouth Rinse q12n4p  . atorvastatin  10 mg Oral q1800  . bisoprolol  10 mg Oral Daily  . budesonide  0.5 mg Nebulization BID  . chlorhexidine  15 mL Mouth Rinse BID  . clopidogrel  75 mg Oral Q breakfast  . digoxin  0.125 mg Oral Daily  . diltiazem  360 mg Oral Daily  . feeding supplement  237 mL Oral BID BM  . imipenem-cilastatin  500 mg Intravenous Q6H  . insulin aspart  0-15 Units Subcutaneous TID WC  . insulin aspart  4 Units Subcutaneous TID WC  . insulin glargine  20 Units Subcutaneous BID  . ipratropium  0.5 mg Nebulization Q6H WA  . levalbuterol  0.63 mg Nebulization Q6H WA  . potassium gluconate  595 mg Oral TID  . predniSONE  40 mg Oral QAC breakfast  . senna-docusate  1 tablet Oral QHS  . sodium chloride  3 mL Intravenous Q12H  . warfarin  2.5 mg Oral ONCE-1800  . Warfarin - Pharmacist Dosing Inpatient   Does not apply q1800  . DISCONTD: ipratropium  0.5 mg Nebulization Q6H  . DISCONTD: levalbuterol  0.63 mg Nebulization Q6H      . 0.9 % NaCl with KCl 20 mEq / L 10 mL/hr at 04/10/12 2208    Physical Exam: The patient appears to be in no distress, lying flat.    Chest reveals no wheezing.  Heart reveals irregularly irregular rhythm  The abdomen is nondistended, and    Lab  Results:  Basename 04/11/12 0506 04/10/12 0340  WBC 12.2* 10.3  HGB 14.2 14.2  PLT 167 145*    Basename 04/11/12 0506 04/10/12 0340  NA 133* 132*  K 4.7 4.6  CL 97 94*  CO2 32 34*  GLUCOSE 85 98  BUN 18 23  CREATININE 0.56 0.58   No results found for this basename: TROPONINI:2,CK,MB:2 in the last 72 hours Hepatic Function Panel No results found for this basename: PROT,ALBUMIN,AST,ALT,ALKPHOS,BILITOT,BILIDIR,IBILI in the last 72 hours No results found for this basename: CHOL in the last 72 hours No results found for this basename: PROTIME in the last 72 hours  Imaging: Imaging results have been reviewed  Cardiac Studies: Telemetry shows atrial fib with controlled VR.  Occ short runs of VT asymptomatic. Assessment/Plan:  1. End stage COPD 2. Chronic atrial fib with rapid ventricular response. 3. Chronic systolic CHF EF 40-45%  Plan: Continue present anti-arrhythmic drugs. Tolerating present regimen. Rate adequately controlled now.  LOS: 7 days    Charmagne Buhl S. 04/11/2012, 9:29 AM

## 2012-04-11 NOTE — Progress Notes (Signed)
ANTICOAGULATION CONSULT NOTE - Follow Up Consult  Pharmacy Consult for Coumadin Indication: atrial fibrillation  No Known Allergies  Patient Measurements: Height: 5\' 9"  (175.3 cm) Weight: 167 lb 15.9 oz (76.2 kg) (bed scale) IBW/kg (Calculated) : 70.7    Vital Signs: Temp: 97.4 F (36.3 C) (07/27 0507) Temp src: Oral (07/27 0507) BP: 121/66 mmHg (07/27 0507) Pulse Rate: 76  (07/27 0507)  Labs:  Basename 04/11/12 0506 04/10/12 0340 04/09/12 0310  HGB 14.2 14.2 --  HCT 42.6 41.9 42.9  PLT 167 145* 148*  APTT -- -- --  LABPROT 21.8* 29.0* 33.2*  INR 1.86* 2.69* 3.19*  HEPARINUNFRC -- -- --  CREATININE 0.56 0.58 0.61  CKTOTAL -- -- --  CKMB -- -- --  TROPONINI -- -- --    Estimated Creatinine Clearance: 72.4 ml/min (by C-G formula based on Cr of 0.56).   Medications:  Scheduled:     . amiodarone  200 mg Oral BID  . antiseptic oral rinse  15 mL Mouth Rinse q12n4p  . atorvastatin  10 mg Oral q1800  . bisoprolol  10 mg Oral Daily  . budesonide  0.5 mg Nebulization BID  . chlorhexidine  15 mL Mouth Rinse BID  . clopidogrel  75 mg Oral Q breakfast  . digoxin  0.125 mg Oral Daily  . diltiazem  360 mg Oral Daily  . feeding supplement  237 mL Oral BID BM  . imipenem-cilastatin  500 mg Intravenous Q6H  . insulin aspart  0-15 Units Subcutaneous TID WC  . insulin aspart  4 Units Subcutaneous TID WC  . insulin glargine  20 Units Subcutaneous BID  . ipratropium  0.5 mg Nebulization Q6H WA  . levalbuterol  0.63 mg Nebulization Q6H WA  . potassium gluconate  595 mg Oral TID  . predniSONE  40 mg Oral QAC breakfast  . senna-docusate  1 tablet Oral QHS  . sodium chloride  3 mL Intravenous Q12H  . warfarin  2.5 mg Oral ONCE-1800  . Warfarin - Pharmacist Dosing Inpatient   Does not apply q1800  . DISCONTD: ipratropium  0.5 mg Nebulization Q6H  . DISCONTD: levalbuterol  0.63 mg Nebulization Q6H   Infusions:     . 0.9 % NaCl with KCl 20 mEq / L 10 mL/hr at 04/10/12 2208      Assessment:  83 YOM on chronic coumadin for Afib, admitted with acute-on-chronic respiratory failure- multifactorial due to COPD exacerbation, Afib with RVR and CHF  Dose per [place] clinic notes is 5mg  daily except 2.5mg  Mon and Fri  INR subtherapeutic today after holding coumadin dose on 7/25 for high INR.  No Bleeding reported in chart notes.  CBC stable.  Patient is also on Plavix therapy.  Amiodarone started this admission, will need to monitor for long-term interaction with coumadin (increased INR); amio can also result in elevated digoxin levels (1.2 on 7/20), consider decreasing dig dose by 50% (done) and recheck level at new steady state (would check on 7/29).  Goal of Therapy:  INR 2-3 Monitor platelets by anticoagulation protocol: Yes   Plan:   Coumadin 2.5 mg po x 1 tonight at 1800   Daily PT/INR  Clance Boll PharmD 9:51 AM 04/11/2012

## 2012-04-11 NOTE — Progress Notes (Signed)
Subjective: Had a reasonable night. Some dry cough but little production. Eating fair. BS was low this AM. Had some pain earlier, better now   Objective: Vital signs in last 24 hours: Temp:  [97.4 F (36.3 C)-97.8 F (36.6 C)] 97.4 F (36.3 C) (07/27 0507) Pulse Rate:  [59-76] 76  (07/27 0507) Resp:  [22-24] 22  (07/27 0507) BP: (109-122)/(66-72) 121/66 mmHg (07/27 0507) SpO2:  [94 %-98 %] 96 % (07/27 0830) Weight:  [76.2 kg (167 lb 15.9 oz)] 76.2 kg (167 lb 15.9 oz) (07/27 0500)  Intake/Output from previous day: 07/26 0701 - 07/27 0700 In: 1530 [P.O.:840; I.V.:390; IV Piggyback:300] Out: 1700 [Urine:1700] Intake/Output this shift: Total I/O In: 358 [P.O.:358] Out: 475 [Urine:475]  General: alert sitting up in no distress. Face symmetric. Lungs distant but no wheeze. Ht sl irreg abd soft, alert, awake, mentating well  Lab Results   [facility] 04/11/12 0506 04/10/12 0340  WBC 12.2* 10.3  RBC 4.76 4.70  HGB 14.2 14.2  HCT 42.6 41.9  MCV 89.5 89.1  MCH 29.8 30.2  RDW 12.8 12.6  PLT 167 145*    Basename 04/11/12 0506 04/10/12 0340  NA 133* 132*  K 4.7 4.6  CL 97 94*  CO2 32 34*  GLUCOSE 85 98  BUN 18 23  CREATININE 0.56 0.58  CALCIUM 8.7 8.5    Studies/Results: No results found.  Scheduled Meds:   . amiodarone  200 mg Oral BID  . antiseptic oral rinse  15 mL Mouth Rinse q12n4p  . atorvastatin  10 mg Oral q1800  . bisoprolol  10 mg Oral Daily  . budesonide  0.5 mg Nebulization BID  . chlorhexidine  15 mL Mouth Rinse BID  . clopidogrel  75 mg Oral Q breakfast  . digoxin  0.125 mg Oral Daily  . diltiazem  360 mg Oral Daily  . feeding supplement  237 mL Oral BID BM  . imipenem-cilastatin  500 mg Intravenous Q6H  . insulin aspart  0-15 Units Subcutaneous TID WC  . insulin aspart  4 Units Subcutaneous TID WC  . insulin glargine  20 Units Subcutaneous BID  . ipratropium  0.5 mg Nebulization Q6H WA  . levalbuterol  0.63 mg Nebulization Q6H WA  . potassium  gluconate  595 mg Oral TID  . predniSONE  40 mg Oral QAC breakfast  . senna-docusate  1 tablet Oral QHS  . sodium chloride  3 mL Intravenous Q12H  . warfarin  2.5 mg Oral ONCE-1800  . warfarin  2.5 mg Oral ONCE-1800  . Warfarin - Pharmacist Dosing Inpatient   Does not apply q1800  . DISCONTD: ipratropium  0.5 mg Nebulization Q6H  . DISCONTD: levalbuterol  0.63 mg Nebulization Q6H   Continuous Infusions:   . 0.9 % NaCl with KCl 20 mEq / L 10 mL/hr at 04/10/12 2208   PRN Meds:acetaminophen, acetaminophen, alum & mag hydroxide-simeth, bisacodyl, morphine, morphine injection, nitroGLYCERIN, ondansetron (ZOFRAN) IV, ondansetron, zolpidem  Assessment/Plan:  1. *Acute-on-chronic respiratory failure- multifactorial due to COPD exacerbation, Afib with RVR and Acute on Chronic Systolic CHF. Continued slow improvement. Continue scheduled nebs, Pulmicort, slow steroid taper, antibiotics- will likely keep on maintenance dose of steroids due to severity of COPD and frequent exacerbations. Wean O2 as tolerated. Doing fair today Active Problems:  2. COPD exacerbation- continue treatment as above. Will drop pred to 40.  3. Acute on Chronic CHF (congestive heart failure)- EF 40-45%- improved. Lasix still on hold due to hyponatremia and mild volume depletion.  4. Permanent atrial fibrillation with RVR s/p pacemaker placement- rate improved with addition of Amiodarone. Continue Cardizem, Bisoprolol, Digoxin orally.  5. CAD (coronary artery disease)-Continue medical therapy.  6. Type 2 diabetes mellitus-steroid induced hyperglycemia improving. FBS too low at 59, adjust 7. Chronic anticoagulation- coumadin per pharmacy , INR OK 8. E.coli UTI (ESBL)- continue Imipenem. Day 5/7.  9. Hyponatremia- back down a little, watch for now.  10. Ethics-Continue palliative care with desire for ongoing medical treatment and hospitalization for acute issues as long as treatment is helping.  11. Dispo- anticipate discharge to  [facility] SNF rehab with Palliative Care services when stable (anticipate Monday)- Transfer to telemetry bed. Continue PT/OT.    LOS: 7 days   Emmaly Leech ALAN 04/11/2012, 10:15 AM

## 2012-04-11 NOTE — Progress Notes (Signed)
CBG:58   Treatment: 15 GM carbohydrate snack  Symptoms: None  Follow-up CBG: Time:0815 CBG Result:73  Possible Reasons for Event   Comments/MD notified:pt states he was not on insulin at home     Genia Harold

## 2012-04-12 LAB — GLUCOSE, CAPILLARY: Glucose-Capillary: 101 mg/dL — ABNORMAL HIGH (ref 70–99)

## 2012-04-12 LAB — PROTIME-INR: Prothrombin Time: 21.5 seconds — ABNORMAL HIGH (ref 11.6–15.2)

## 2012-04-12 LAB — CBC
Hemoglobin: 14.9 g/dL (ref 13.0–17.0)
MCH: 30.1 pg (ref 26.0–34.0)
MCHC: 33.9 g/dL (ref 30.0–36.0)
RDW: 12.9 % (ref 11.5–15.5)

## 2012-04-12 LAB — BASIC METABOLIC PANEL
BUN: 18 mg/dL (ref 6–23)
Calcium: 8.6 mg/dL (ref 8.4–10.5)
GFR calc Af Amer: >90 mL/min (ref >90)
GFR calc non Af Amer: >90 mL/min (ref >90)
Glucose, Bld: 57 mg/dL — ABNORMAL LOW (ref 70–99)
Sodium: 134 mEq/L — ABNORMAL LOW (ref 135–145)

## 2012-04-12 MED ORDER — INSULIN GLARGINE 100 UNIT/ML ~~LOC~~ SOLN
5.0000 [IU] | Freq: Every day | SUBCUTANEOUS | Status: DC
  Administered 2012-04-12: 5 [IU] via SUBCUTANEOUS

## 2012-04-12 MED ORDER — PREDNISONE 20 MG PO TABS
30.0000 mg | ORAL_TABLET | Freq: Every day | ORAL | Status: DC
  Administered 2012-04-13: 30 mg via ORAL
  Filled 2012-04-12 (×3): qty 1

## 2012-04-12 MED ORDER — WARFARIN SODIUM 4 MG PO TABS
4.0000 mg | ORAL_TABLET | Freq: Once | ORAL | Status: AC
  Administered 2012-04-12: 4 mg via ORAL
  Filled 2012-04-12: qty 1

## 2012-04-12 NOTE — Progress Notes (Signed)
CBG 60  Treatment: 15 GM carbohydrate snack  Symptoms: None  Follow-up CBG: [id number] CBG Result:101  Possible Reasons for Event: Unknown  Comments/MD notified: shaw     Genia Harold

## 2012-04-12 NOTE — Progress Notes (Signed)
Subjective: Continues to slowly improve. Did well after a breathing treatment. Able to lie flat without more dyspnea. No pain. Eating better. FBS still a bit low at 60   Objective: Vital signs in last 24 hours: Temp:  [97.6 F (36.4 C)-98.1 F (36.7 C)] 97.6 F (36.4 C) (07/28 0500) Pulse Rate:  [70-86] 86  (07/28 0500) Resp:  [22-24] 22  (07/28 0500) BP: (102-121)/(61-77) 114/71 mmHg (07/28 0500) SpO2:  [93 %-97 %] 97 % (07/28 0845) Weight:  [68.2 kg (150 lb 5.7 oz)] 68.2 kg (150 lb 5.7 oz) (07/28 0600)  Intake/Output from previous day: 07/27 0701 - 07/28 0700 In: 2728 [P.O.:2378; I.V.:150; IV Piggyback:200] Out: 2725 [Urine:2725] Intake/Output this shift: Total I/O In: 360 [P.O.:360] Out: 450 [Urine:450]  General: alert  . Face symmetric. Lungs distant but no wheeze. Ht sl irreg abd soft, alert, awake, mentating well, no edema.   Lab Results   [facility] 04/12/12 0446 04/11/12 0506  WBC 18.8* 12.2*  RBC 4.95 4.76  HGB 14.9 14.2  HCT 43.9 42.6  MCV 88.7 89.5  MCH 30.1 29.8  RDW 12.9 12.8  PLT 191 167    Basename 04/12/12 0446 04/11/12 0506  NA 134* 133*  K 4.3 4.7  CL 99 97  CO2 31 32  GLUCOSE 57* 85  BUN 18 18  CREATININE 0.53 0.56  CALCIUM 8.6 8.7    Studies/Results: No results found.  Scheduled Meds:   . amiodarone  200 mg Oral BID  . antiseptic oral rinse  15 mL Mouth Rinse q12n4p  . atorvastatin  10 mg Oral q1800  . bisoprolol  10 mg Oral Daily  . budesonide  0.5 mg Nebulization BID  . chlorhexidine  15 mL Mouth Rinse BID  . clopidogrel  75 mg Oral Q breakfast  . digoxin  0.125 mg Oral Daily  . diltiazem  360 mg Oral Daily  . feeding supplement  237 mL Oral BID BM  . imipenem-cilastatin  500 mg Intravenous Q6H  . insulin aspart  0-15 Units Subcutaneous TID WC  . insulin aspart  4 Units Subcutaneous TID WC  . insulin glargine  22 Units Subcutaneous Daily  . insulin glargine  5 Units Subcutaneous QHS  . ipratropium  0.5 mg Nebulization Q6H WA    . levalbuterol  0.63 mg Nebulization Q6H WA  . potassium gluconate  595 mg Oral TID  . predniSONE  40 mg Oral QAC breakfast  . senna-docusate  1 tablet Oral QHS  . sodium chloride  3 mL Intravenous Q12H  . warfarin  2.5 mg Oral ONCE-1800  . Warfarin - Pharmacist Dosing Inpatient   Does not apply q1800  . DISCONTD: insulin glargine  8 Units Subcutaneous QHS   Continuous Infusions:   . 0.9 % NaCl with KCl 20 mEq / L 10 mL/hr at 04/10/12 2208   PRN Meds:acetaminophen, acetaminophen, alum & mag hydroxide-simeth, bisacodyl, morphine, morphine injection, nitroGLYCERIN, ondansetron (ZOFRAN) IV, ondansetron, zolpidem  Assessment/Plan:  1. *Acute-on-chronic respiratory failure- multifactorial due to COPD exacerbation,  No signifcant bronchospasm today Active Problems:  2. COPD exacerbation- continue treatment as above. Will drop pred to 30 today.  3. Acute on Chronic CHF (congestive heart failure)- EF 40-45%- improved Doing better, able to lie flat. BNP 2221 .   4. Permanent atrial fibrillation with RVR s/p pacemaker placement- rate improved with addition of Amiodarone. Continue Cardizem, Bisoprolol, Digoxin orally.  5. CAD (coronary artery disease)-Continue medical therapy.  6. Type 2 diabetes mellitus-steroid induced hyperglycemia improving. FBS still  too low at 69, adjust  7. Chronic anticoagulation- coumadin per pharmacy , INR OK  8. E.coli UTI (ESBL)- continue Imipenem. Day 6/7.  9. Hyponatremia- sl better  10. Ethics-Continue palliative care with desire for ongoing medical treatment and hospitalization for acute issues as long as treatment is helping.  11. Dispo- anticipate discharge to [facility] SN[facility] rehab with Palliative Care services when stable (anticipate Monday)- Transfer to telemetry bed. Continue PT/OT.    LOS: 8 days   Cyrah Mclamb ALAN 04/12/2012, 10:38 AM

## 2012-04-12 NOTE — Progress Notes (Signed)
04-12-12 0130 NSG:  Have been able to wean pt down to 2l/m [place] and is sustaining 93% ox sats.  Pt mobility is improving tonight he has been able to assist [place] in turning him - he is doing 75% of the work.  Still requires lift back to bed after PT has gotten him out of bed during the day.

## 2012-04-12 NOTE — Progress Notes (Signed)
Attempted to get pt up to [facility] x 3 assist, legs too weak to stand.  Genia Harold

## 2012-04-12 NOTE — Progress Notes (Signed)
ANTICOAGULATION CONSULT NOTE - Follow Up Consult  Pharmacy Consult for Coumadin Indication: atrial fibrillation  No Known Allergies  Patient Measurements: Height: 5\' 9"  (175.3 cm) Weight: 150 lb 5.7 oz (68.2 kg) IBW/kg (Calculated) : 70.7    Vital Signs: Temp: 97.6 F (36.4 C) (07/28 0500) Temp src: Oral (07/28 0500) BP: 114/71 mmHg (07/28 0500) Pulse Rate: 86  (07/28 0500)  Labs:  Basename 04/12/12 0446 04/11/12 0506 04/10/12 0340  HGB 14.9 14.2 --  HCT 43.9 42.6 41.9  PLT 191 167 145*  APTT -- -- --  LABPROT 21.5* 21.8* 29.0*  INR 1.83* 1.86* 2.69*  HEPARINUNFRC -- -- --  CREATININE 0.53 0.56 0.58  CKTOTAL -- -- --  CKMB -- -- --  TROPONINI -- -- --    Estimated Creatinine Clearance: 69.9 ml/min (by C-G formula based on Cr of 0.53).   Medications:  Scheduled:     . amiodarone  200 mg Oral BID  . antiseptic oral rinse  15 mL Mouth Rinse q12n4p  . atorvastatin  10 mg Oral q1800  . bisoprolol  10 mg Oral Daily  . budesonide  0.5 mg Nebulization BID  . chlorhexidine  15 mL Mouth Rinse BID  . clopidogrel  75 mg Oral Q breakfast  . digoxin  0.125 mg Oral Daily  . diltiazem  360 mg Oral Daily  . feeding supplement  237 mL Oral BID BM  . imipenem-cilastatin  500 mg Intravenous Q6H  . insulin aspart  0-15 Units Subcutaneous TID WC  . insulin aspart  4 Units Subcutaneous TID WC  . insulin glargine  22 Units Subcutaneous Daily  . insulin glargine  5 Units Subcutaneous QHS  . ipratropium  0.5 mg Nebulization Q6H WA  . levalbuterol  0.63 mg Nebulization Q6H WA  . potassium gluconate  595 mg Oral TID  . predniSONE  30 mg Oral QAC breakfast  . senna-docusate  1 tablet Oral QHS  . sodium chloride  3 mL Intravenous Q12H  . warfarin  2.5 mg Oral ONCE-1800  . Warfarin - Pharmacist Dosing Inpatient   Does not apply q1800  . DISCONTD: insulin glargine  8 Units Subcutaneous QHS  . DISCONTD: predniSONE  40 mg Oral QAC breakfast   Infusions:     . 0.9 % NaCl with KCl  20 mEq / L 10 mL/hr at 04/10/12 2208    Assessment:  83 YOM on chronic coumadin for Afib, admitted with acute-on-chronic respiratory failure- multifactorial due to COPD exacerbation, Afib with RVR and CHF  Dose per [place] clinic notes is 5mg  daily except 2.5mg  Mon and Fri  INR subtherapeutic today after holding coumadin dose on 7/25 for high INR.  No Bleeding reported in chart notes.  CBC stable.  Patient is also on Plavix therapy.  Amiodarone started this admission, will need to monitor for long-term interaction with coumadin (increased INR); amio can also result in elevated digoxin levels (1.2 on 7/20), consider decreasing dig dose by 50% (done) and recheck level at new steady state (would check on 7/29).  Goal of Therapy:  INR 2-3 Monitor platelets by anticoagulation protocol: Yes   Plan:   Coumadin 4 mg po x 1 tonight at 1800.  Daily PT/INR  Clance Boll PharmD 11:42 AM 04/12/2012

## 2012-04-12 NOTE — Progress Notes (Signed)
   Subjective:   Heart rate 80-90 atrial fib  Objective:  Vital Signs in the last 24 hours: Temp:  [97.6 F (36.4 C)-98.1 F (36.7 C)] 97.6 F (36.4 C) (07/28 0500) Pulse Rate:  [70-86] 86  (07/28 0500) Resp:  [22-24] 22  (07/28 0500) BP: (102-121)/(61-77) 114/71 mmHg (07/28 0500) SpO2:  [93 %-97 %] 97 % (07/28 0845) Weight:  [68.2 kg (150 lb 5.7 oz)] 68.2 kg (150 lb 5.7 oz) (07/28 0600)  Intake/Output from previous day: 07/27 0701 - 07/28 0700 In: 2728 [P.O.:2378; I.V.:150; IV Piggyback:200] Out: 2725 [Urine:2725] Intake/Output from this shift: Total I/O In: 360 [P.O.:360] Out: 150 [Urine:150]     . amiodarone  200 mg Oral BID  . antiseptic oral rinse  15 mL Mouth Rinse q12n4p  . atorvastatin  10 mg Oral q1800  . bisoprolol  10 mg Oral Daily  . budesonide  0.5 mg Nebulization BID  . chlorhexidine  15 mL Mouth Rinse BID  . clopidogrel  75 mg Oral Q breakfast  . digoxin  0.125 mg Oral Daily  . diltiazem  360 mg Oral Daily  . feeding supplement  237 mL Oral BID BM  . imipenem-cilastatin  500 mg Intravenous Q6H  . insulin aspart  0-15 Units Subcutaneous TID WC  . insulin aspart  4 Units Subcutaneous TID WC  . insulin glargine  22 Units Subcutaneous Daily  . insulin glargine  8 Units Subcutaneous QHS  . ipratropium  0.5 mg Nebulization Q6H WA  . levalbuterol  0.63 mg Nebulization Q6H WA  . potassium gluconate  595 mg Oral TID  . predniSONE  40 mg Oral QAC breakfast  . senna-docusate  1 tablet Oral QHS  . sodium chloride  3 mL Intravenous Q12H  . warfarin  2.5 mg Oral ONCE-1800  . Warfarin - Pharmacist Dosing Inpatient   Does not apply q1800  . DISCONTD: insulin glargine  20 Units Subcutaneous BID      . 0.9 % NaCl with KCl 20 mEq / L 10 mL/hr at 04/10/12 2208    Physical Exam: The patient appears to be in no distress, lying flat. Receiving breathing treatment.    Chest reveals no wheezing.  Heart reveals irregularly irregular rhythm  The abdomen is  nondistended No edema    Lab Results:  Basename 04/12/12 0446 04/11/12 0506  WBC 18.8* 12.2*  HGB 14.9 14.2  PLT 191 167    Basename 04/12/12 0446 04/11/12 0506  NA 134* 133*  K 4.3 4.7  CL 99 97  CO2 31 32  GLUCOSE 57* 85  BUN 18 18  CREATININE 0.53 0.56   No results found for this basename: TROPONINI:2,CK,MB:2 in the last 72 hours Hepatic Function Panel No results found for this basename: PROT,ALBUMIN,AST,ALT,ALKPHOS,BILITOT,BILIDIR,IBILI in the last 72 hours No results found for this basename: CHOL in the last 72 hours No results found for this basename: PROTIME in the last 72 hours  Imaging: Imaging results have been reviewed  Cardiac Studies: Telemetry shows atrial fib with controlled VR.  Occ short runs of VT asymptomatic. Assessment/Plan:  1. End stage COPD 2. Chronic atrial fib with rapid ventricular response. 3. Chronic systolic CHF EF 40-45%  Plan: Continue present anti-arrhythmic drugs. Tolerating present regimen. Rate adequately controlled now.  LOS: 8 days    Ferrel Simington S. 04/12/2012, 9:10 AM

## 2012-04-13 DIAGNOSIS — R29898 Other symptoms and signs involving the musculoskeletal system: Secondary | ICD-10-CM

## 2012-04-13 LAB — CBC
MCH: 29.7 pg (ref 26.0–34.0)
MCV: 88.3 fL (ref 78.0–100.0)
Platelets: 174 10*3/uL (ref 150–400)
RBC: 4.71 MIL/uL (ref 4.22–5.81)

## 2012-04-13 LAB — PRO B NATRIURETIC PEPTIDE: Pro B Natriuretic peptide (BNP): 2104 pg/mL — ABNORMAL HIGH (ref 0–450)

## 2012-04-13 LAB — GLUCOSE, CAPILLARY: Glucose-Capillary: 139 mg/dL — ABNORMAL HIGH (ref 70–99)

## 2012-04-13 LAB — BASIC METABOLIC PANEL
CO2: 30 mEq/L (ref 19–32)
Calcium: 8.9 mg/dL (ref 8.4–10.5)
Creatinine, Ser: 0.56 mg/dL (ref 0.50–1.35)

## 2012-04-13 LAB — DIGOXIN LEVEL: Digoxin Level: 0.7 ng/mL — ABNORMAL LOW (ref 0.8–2.0)

## 2012-04-13 MED ORDER — INSULIN ASPART 100 UNIT/ML ~~LOC~~ SOLN
6.0000 [IU] | Freq: Three times a day (TID) | SUBCUTANEOUS | Status: DC
  Administered 2012-04-13 – 2012-04-14 (×4): 6 [IU] via SUBCUTANEOUS

## 2012-04-13 MED ORDER — WARFARIN SODIUM 4 MG PO TABS
4.0000 mg | ORAL_TABLET | Freq: Once | ORAL | Status: AC
  Administered 2012-04-13: 4 mg via ORAL
  Filled 2012-04-13: qty 1

## 2012-04-13 MED ORDER — AMIODARONE HCL 200 MG PO TABS
200.0000 mg | ORAL_TABLET | Freq: Every day | ORAL | Status: DC
  Administered 2012-04-13 – 2012-04-14 (×2): 200 mg via ORAL
  Filled 2012-04-13 (×2): qty 1

## 2012-04-13 NOTE — Progress Notes (Signed)
Physical Therapy Treatment Patient Details Name: [name] MRN: [id number] DOB: 07-13-1928 Today's Date: 04/13/2012 Time: 7846-9629 PT Time Calculation (min): 37 min  PT Assessment / Plan / Recommendation Comments on Treatment Session  Pt initially on RA upon PT/OT arrival, however with bed mobility, pt felt that he needed to be put on 2LO2 with sats in high 90's throughout.  Continues to be unable to take steps due to B LEs buckling and increased anxiety with being up.      Follow Up Recommendations  Skilled nursing facility    Barriers to Discharge        Equipment Recommendations  Defer to next venue    Recommendations for Other Services    Frequency Min 3X/week   Plan Discharge plan remains appropriate    Precautions / Restrictions Precautions Precautions: Fall Precaution Comments: dyspnea, decreased BP, afib Restrictions Weight Bearing Restrictions: No Other Position/Activity Restrictions: used 2 L O2 during session   Pertinent Vitals/Pain Pain in B LE, then pt stated that it wasn't actual "pain" just weakness he felt.     Mobility  Bed Mobility Bed Mobility: Supine to Sit;Sitting - Scoot to Edge of Bed Supine to Sit: 4: Min guard Sitting - Scoot to [place] of Bed: 4: Min guard Details for Bed Mobility Assistance: Pt doing well getting self to EOB at min/guard for safety with cues for technique and continued pursed lip breathing throughout.  Transfers Transfers: Sit to Stand;Stand to Sit;Squat Pivot Transfers Sit to Stand: 1: +2 Total assist;From elevated surface;With upper extremity assist;From bed Sit to Stand: Patient Percentage: 40% Stand to Sit: 1: +2 Total assist;To bed;To chair/3-in-1 Stand to Sit: Patient Percentage: 40% Squat Pivot Transfers: 1: +2 Total assist Squat Pivot Transfers: Patient Percentage: 40% Details for Transfer Assistance: Attempted sit to stand from bed with +2 assist and therapists blocking B LEs to prevent buckling.  Verbal and manual  cuing for quad and glute activation for upright stance.  Pt able to stand up staight approx 20 secs, however upon trying to take a step, pt becomes very anxious and LEs buckle. Therefore performed squat pivot transfer to decrease anxiety and need for being upright.   Ambulation/Gait Ambulation/Gait Assistance: Not tested (comment)    Exercises     PT Diagnosis:    PT Problem List:   PT Treatment Interventions:     PT Goals Acute Rehab PT Goals PT Goal Formulation: With patient Time For Goal Achievement: 04/20/12 Potential to Achieve Goals: Fair Pt will go Supine/Side to Sit: with supervision;with HOB 0 degrees PT Goal: Supine/Side to Sit - Progress: Progressing toward goal Pt will go Sit to Supine/Side: with supervision PT Goal: Sit to Supine/Side - Progress: Progressing toward goal Pt will go Sit to Stand: with min assist;with upper extremity assist PT Goal: Sit to Stand - Progress: Progressing toward goal Pt will go Stand to Sit: with min assist;with upper extremity assist PT Goal: Stand to Sit - Progress: Progressing toward goal Pt will Transfer Bed to Chair/Chair to Bed: with min assist PT Transfer Goal: Bed to Chair/Chair to Bed - Progress: Progressing toward goal  Visit Information  Last PT Received On: 04/13/12 Assistance Needed: +2    Subjective Data  Subjective: My knees and hips buckle on me.    Cognition  Overall Cognitive Status: Appears within functional limits for tasks assessed/performed Arousal/Alertness: Awake/alert Orientation Level: Appears intact for tasks assessed Behavior During Session: [facility] for tasks performed Cognition - Other Comments: Pt states he is somewhat  depressed due to not being able to get up and walk yet.     Balance     End of Session PT - End of Session Equipment Utilized During Treatment: Gait belt Activity Tolerance: Patient limited by fatigue;Treatment limited secondary to medical complications (Comment) Patient left: in chair;with  call bell/phone within reach Nurse Communication: Need for lift equipment;Mobility status   GP     [name] 04/13/2012, 9:42 AM

## 2012-04-13 NOTE — Progress Notes (Signed)
Subjective: Patient is very apprehensive about discharge to SNF- thinks he is too weak.  Unable to stand without maximal assistance which he does not feel that [employer] is equipped to handle.  States that last time he was discharged like this he was readmitted because they couldn't handle him.  "Legs feel like jello."  Breathing is getting better, nearing baseline.    Objective: Vital signs in last 24 hours: Temp:  [97.6 F (36.4 C)-98.4 F (36.9 C)] 97.7 F (36.5 C) (07/29 0500) Pulse Rate:  [84-87] 84  (07/29 0500) Resp:  [18-22] 20  (07/29 0500) BP: (123-130)/(69-75) 130/75 mmHg (07/29 0500) SpO2:  [90 %-98 %] 90 % (07/29 0500) Weight:  [68.4 kg (150 lb 12.7 oz)] 68.4 kg (150 lb 12.7 oz) (07/29 0300) Weight change: 0.2 kg (7.1 oz) Last BM Date: 04/10/12  CBG (last 3)   Basename 04/13/12 0716 04/12/12 2136 04/12/12 1633  GLUCAP 88 191* 340*    Intake/Output from previous day: 07/28 0701 - 07/29 0700 In: 3140 [P.O.:2400; I.V.:240; IV Piggyback:500] Out: 2670 [Urine:2670] Intake/Output this shift:    General appearance: alert and mild tachypnea, weak Eyes: no scleral icterus Throat: oropharynx moist without erythema Resp: clear to auscultation bilaterally Cardio: irregularly irregular rhythm GI: soft, non-tender; bowel sounds normal; no masses,  no organomegaly Extremities: no clubbing, cyanosis or edema; cool   Lab Results:  [facility] 04/13/12 0418 04/12/12 0446  NA 135 134*  K 5.4* 4.3  CL 99 99  CO2 30 31  GLUCOSE 136* 57*  BUN 20 18  CREATININE 0.56 0.53  CALCIUM 8.9 8.6  MG -- --  PHOS -- --    Basename 04/13/12 0418 04/12/12 0446  WBC 13.9* 18.8*  NEUTROABS -- --  HGB 14.0 14.9  HCT 41.6 43.9  MCV 88.3 88.7  PLT 174 191   Lab Results  Component Value Date   INR 1.86* 04/13/2012   INR 1.83* 04/12/2012   INR 1.86* 04/11/2012   Studies/Results: No results found.   Medications: Scheduled:   . amiodarone  200 mg Oral Daily  . antiseptic oral  rinse  15 mL Mouth Rinse q12n4p  . atorvastatin  10 mg Oral q1800  . bisoprolol  10 mg Oral Daily  . budesonide  0.5 mg Nebulization BID  . chlorhexidine  15 mL Mouth Rinse BID  . clopidogrel  75 mg Oral Q breakfast  . digoxin  0.125 mg Oral Daily  . diltiazem  360 mg Oral Daily  . feeding supplement  237 mL Oral BID BM  . imipenem-cilastatin  500 mg Intravenous Q6H  . insulin aspart  0-15 Units Subcutaneous TID WC  . insulin aspart  6 Units Subcutaneous TID WC  . insulin glargine  22 Units Subcutaneous Daily  . ipratropium  0.5 mg Nebulization Q6H WA  . levalbuterol  0.63 mg Nebulization Q6H WA  . predniSONE  30 mg Oral QAC breakfast  . senna-docusate  1 tablet Oral QHS  . sodium chloride  3 mL Intravenous Q12H  . warfarin  4 mg Oral ONCE-1800  . Warfarin - Pharmacist Dosing Inpatient   Does not apply q1800  . DISCONTD: amiodarone  200 mg Oral BID  . DISCONTD: insulin aspart  4 Units Subcutaneous TID WC  . DISCONTD: insulin glargine  5 Units Subcutaneous QHS  . DISCONTD: insulin glargine  8 Units Subcutaneous QHS  . DISCONTD: potassium gluconate  595 mg Oral TID  . DISCONTD: predniSONE  40 mg Oral QAC breakfast  Continuous:   . DISCONTD: 0.9 % NaCl with KCl 20 mEq / L 10 mL/hr at 04/10/12 2208    Assessment/Plan: 1. *Acute-on-chronic respiratory failure- multifactorial due to COPD exacerbation, acute on chronic CHF.  Approaching baseline.  Off O2- monitor sats.  Active Problems:  2. COPD exacerbation- continue slow prednisone taper.  Will keep on 5mg  daily for maintenance given severity of COPD.  Continue Pulmicort, Xopenex/Atrovent scheduled. 3. Acute on Chronic CHF (congestive heart failure)- EF 40-45%- improved.  Remains off lasix.  BNP stable. 4. Permanent atrial fibrillation with RVR s/p pacemaker placement- Continue Cardizem, Bisoprolol, Digoxin, Amio, coumadin.  Recheck Digoxin level per pharmacy recs.  5. CAD (coronary artery disease)-Continue medical therapy.  6.  Type 2 diabetes mellitus-steroid induced hyperglycemia improving. Will decrease Lantus to 22 units daily and increase prandial insulin.   7. Chronic anticoagulation- coumadin per pharmacy. 8. E.coli UTI (ESBL)- continue Imipenem. Day 7/7- discontinue after today's dose. 9. Hyponatremia- stable off diuretics. 10. Ethics-Continue palliative care with desire for ongoing medical treatment and hospitalization for acute issues as long as treatment is helping.  11. Dispo- patient is apprehensive about discharge due to severe deconditioning and concern over readmission.  Will ambulate to chair today, continue PT/OT.  Plan for discharge to [facility] tomorrow.    LOS: 9 days   Myanna Ziesmer,[name] 04/13/2012, 7:39 AM

## 2012-04-13 NOTE — Progress Notes (Signed)
I have received report from Karen R.N. FLEX NURSE. 

## 2012-04-13 NOTE — Progress Notes (Signed)
Occupational Therapy Treatment Patient Details Name: [name] MRN: [id number] DOB: April 17, 1928 Today's Date: 04/13/2012 Time: 4098-1191 OT Time Calculation (min): 37 min  OT Assessment / Plan / Recommendation Comments on Treatment Session Pt motivated but fearful of moving:  performed squat pivot transfer and worked on standing balance.  Pt fatiques easily    Follow Up Recommendations  Skilled nursing facility    Barriers to Discharge       Equipment Recommendations  Defer to next venue    Recommendations for Other Services    Frequency Min 2X/week   Plan Discharge plan remains appropriate    Precautions / Restrictions Precautions Precautions: Fall Precaution Comments: dyspnea, decreased BP, afib Restrictions Weight Bearing Restrictions: No Other Position/Activity Restrictions: used 2 L O2 during session   Pertinent Vitals/Pain Premedicated.  Pt states knees and hips hurt    ADL  Toilet Transfer: +2 Total assistance Toilet Transfer: Patient Percentage: 40% Toilet Transfer Method: Squat pivot Toilet Transfer Equipment:  (bed to chair) Transfers/Ambulation Related to ADLs: Pt fearful:  wanted "bigger people" to help him.  Sat EOB  x 5+ minutes:  with intermittent UE support:  didn't need.  Didn't want to perform any grooming tasks.  Bed mob min guard with rail.  Sit to stand total A x 2 pt 40% ADL Comments: Sitting tolerance improving: can try adls from unsupported sitting    OT Diagnosis:    OT Problem List:   OT Treatment Interventions:     OT Goals ADL Goals Pt Will Transfer to Toilet: with 2+ total assist;Stand pivot transfer;3-in-1;Other (comment) (50%) ADL Goal: Toilet Transfer - Progress: Updated due to goal met Miscellaneous OT Goals Miscellaneous OT Goal #1: pt will tolerate sitting eob for 5 minutes with unilateral support at mod independent level performing light adl activity OT Goal: Miscellaneous Goal #1 - Progress: Progressing toward  goals Miscellaneous OT Goal #2: pt will initiate at least 2 rest breaks per session for energy conservation OT Goal: Miscellaneous Goal #2 - Progress: Met  Visit Information  Last OT Received On: 04/13/12 Assistance Needed: +2 PT/OT Co-Evaluation/Treatment: Yes    Subjective Data      Prior Functioning       Cognition  Overall Cognitive Status: Appears within functional limits for tasks assessed/performed Arousal/Alertness: Awake/alert Orientation Level: Appears intact for tasks assessed Behavior During Session: [facility] for tasks performed Cognition - Other Comments: Pt states he is somewhat depressed due to not being able to get up and walk yet.     Mobility Bed Mobility Bed Mobility: Supine to Sit;Sitting - Scoot to Edge of Bed Supine to Sit: 4: Min guard Sitting - Scoot to [place] of Bed: 4: Min guard Details for Bed Mobility Assistance: pt does initiate rest breaks Transfers Sit to Stand: 1: +2 Total assist;From elevated surface;With upper extremity assist;From bed Sit to Stand: Patient Percentage: 40% Stand to Sit: 1: +2 Total assist;To bed;To chair/3-in-1 Stand to Sit: Patient Percentage: 40% Details for Transfer Assistance: Attempted sit to stand from bed with +2 assist and therapists blocking B LEs to prevent buckling.  Verbal and manual cuing for quad and glute activation for upright stance.  Pt able to stand up staight approx 20 secs, however upon trying to take a step, pt becomes very anxious and LEs buckle. Therefore performed squat pivot transfer to decrease anxiety and need for being upright.     Exercises    Balance Static Sitting Balance Static Sitting - Balance Support: No upper extremity supported Static  Sitting - Level of Assistance: 6: Modified independent (Device/Increase time) Static Sitting - Comment/# of Minutes: 5  End of Session OT - End of Session Activity Tolerance: Patient limited by fatigue;Other (comment) Patient left: in chair;with call bell/phone  within reach (maxi move pad not placed:  brought to room)  GO     [name] 04/13/2012, 10:09 AM [name], OTR/L [phone] 04/13/2012

## 2012-04-13 NOTE — Progress Notes (Signed)
   Subjective:   + DOE; chest "sore"; constipated; weak  Objective:  Vital Signs in the last 24 hours: Temp:  [97.6 F (36.4 C)-98.4 F (36.9 C)] 97.7 F (36.5 C) (07/29 0500) Pulse Rate:  [84-87] 84  (07/29 0500) Resp:  [18-22] 20  (07/29 0500) BP: (123-130)/(69-75) 130/75 mmHg (07/29 0500) SpO2:  [90 %-98 %] 90 % (07/29 0500) Weight:  [68.4 kg (150 lb 12.7 oz)] 68.4 kg (150 lb 12.7 oz) (07/29 0300)  Intake/Output from previous day: 07/28 0701 - 07/29 0700 In: 3140 [P.O.:2400; I.V.:240; IV Piggyback:500] Out: 2470 [Urine:2470] Intake/Output from this shift: Total I/O In: 1760 [P.O.:1320; I.V.:240; IV Piggyback:200] Out: 1700 [Urine:1700]     . amiodarone  200 mg Oral BID  . antiseptic oral rinse  15 mL Mouth Rinse q12n4p  . atorvastatin  10 mg Oral q1800  . bisoprolol  10 mg Oral Daily  . budesonide  0.5 mg Nebulization BID  . chlorhexidine  15 mL Mouth Rinse BID  . clopidogrel  75 mg Oral Q breakfast  . digoxin  0.125 mg Oral Daily  . diltiazem  360 mg Oral Daily  . feeding supplement  237 mL Oral BID BM  . imipenem-cilastatin  500 mg Intravenous Q6H  . insulin aspart  0-15 Units Subcutaneous TID WC  . insulin aspart  4 Units Subcutaneous TID WC  . insulin glargine  22 Units Subcutaneous Daily  . insulin glargine  5 Units Subcutaneous QHS  . ipratropium  0.5 mg Nebulization Q6H WA  . levalbuterol  0.63 mg Nebulization Q6H WA  . potassium gluconate  595 mg Oral TID  . predniSONE  30 mg Oral QAC breakfast  . senna-docusate  1 tablet Oral QHS  . sodium chloride  3 mL Intravenous Q12H  . warfarin  4 mg Oral ONCE-1800  . Warfarin - Pharmacist Dosing Inpatient   Does not apply q1800  . DISCONTD: insulin glargine  8 Units Subcutaneous QHS  . DISCONTD: predniSONE  40 mg Oral QAC breakfast      . 0.9 % NaCl with KCl 20 mEq / L 10 mL/hr at 04/10/12 2208    Physical Exam: The patient appears to be in no distress, lying flat.  Heent - Normal  Neck Supple  Chest  reveals no wheezing. Diminished BS  Heart reveals irregularly irregular rhythm  The abdomen is nondistended  No edema    Lab Results:  Basename 04/13/12 0418 04/12/12 0446  WBC 13.9* 18.8*  HGB 14.0 14.9  PLT 174 191    Basename 04/13/12 0418 04/12/12 0446  NA 135 134*  K 5.4* 4.3  CL 99 99  CO2 30 31  GLUCOSE 136* 57*  BUN 20 18  CREATININE 0.56 0.53    Cardiac Studies: Telemetry shows atrial fib with controlled VR.  Occ short runs of VT asymptomatic. Assessment/Plan:  1. End stage COPD 2. Permanent atrial fib with rapid ventricular response. 3. Chronic systolic CHF EF 40-45% 4. NCB  Plan: HR is controlled; continue present meds but decrease amiodarone to 200 mg po daily; continue coumadin. DC KCL. Patient can be Dced from a cardiac standpoint and FU with Dr Ladona Ridgel in 4-6 weeks. His INR will need to followed closely given addition of amiodarone and interaction with coumadin. Please call with questions.  [name] 04/13/2012, 6:23 AM

## 2012-04-13 NOTE — Progress Notes (Signed)
ANTICOAGULATION CONSULT NOTE - Follow Up Consult  Pharmacy Consult for Coumadin Indication: atrial fibrillation  No Known Allergies  Patient Measurements: Height: 5\' 9"  (175.3 cm) Weight: 150 lb 12.7 oz (68.4 kg) IBW/kg (Calculated) : 70.7    Vital Signs: Temp: 97.7 F (36.5 C) (07/29 0500) Temp src: Oral (07/29 0500) BP: 130/75 mmHg (07/29 0500) Pulse Rate: 84  (07/29 0500)  Labs:  Basename 04/13/12 0418 04/12/12 0446 04/11/12 0506  HGB 14.0 14.9 --  HCT 41.6 43.9 42.6  PLT 174 191 167  APTT -- -- --  LABPROT 21.8* 21.5* 21.8*  INR 1.86* 1.83* 1.86*  HEPARINUNFRC -- -- --  CREATININE 0.56 0.53 0.56  CKTOTAL -- -- --  CKMB -- -- --  TROPONINI -- -- --    Estimated Creatinine Clearance: 70.1 ml/min (by C-G formula based on Cr of 0.56).   Medications:  Scheduled:     . amiodarone  200 mg Oral Daily  . antiseptic oral rinse  15 mL Mouth Rinse q12n4p  . atorvastatin  10 mg Oral q1800  . bisoprolol  10 mg Oral Daily  . budesonide  0.5 mg Nebulization BID  . chlorhexidine  15 mL Mouth Rinse BID  . clopidogrel  75 mg Oral Q breakfast  . digoxin  0.125 mg Oral Daily  . diltiazem  360 mg Oral Daily  . feeding supplement  237 mL Oral BID BM  . imipenem-cilastatin  500 mg Intravenous Q6H  . insulin aspart  0-15 Units Subcutaneous TID WC  . insulin aspart  6 Units Subcutaneous TID WC  . insulin glargine  22 Units Subcutaneous Daily  . ipratropium  0.5 mg Nebulization Q6H WA  . levalbuterol  0.63 mg Nebulization Q6H WA  . predniSONE  30 mg Oral QAC breakfast  . senna-docusate  1 tablet Oral QHS  . sodium chloride  3 mL Intravenous Q12H  . warfarin  4 mg Oral ONCE-1800  . Warfarin - Pharmacist Dosing Inpatient   Does not apply q1800  . DISCONTD: amiodarone  200 mg Oral BID  . DISCONTD: insulin aspart  4 Units Subcutaneous TID WC  . DISCONTD: insulin glargine  5 Units Subcutaneous QHS  . DISCONTD: insulin glargine  8 Units Subcutaneous QHS  . DISCONTD: potassium  gluconate  595 mg Oral TID  . DISCONTD: predniSONE  40 mg Oral QAC breakfast   Infusions:     . DISCONTD: 0.9 % NaCl with KCl 20 mEq / L 10 mL/hr at 04/10/12 2208    Assessment:  83 YOM on chronic coumadin for Afib, admitted with acute-on-chronic respiratory failure- multifactorial due to COPD exacerbation, Afib with RVR and CHF  Dose per [facility] notes is 5mg  daily except 2.5mg  Mon and Fri  INR subtherapeutic today after holding coumadin dose on 7/25 for high INR.  No Bleeding reported in chart notes.    Patient is also on Plavix therapy.  Amiodarone started this admission, will need to monitor for long-term interaction with coumadin (increased INR); amio can also result in elevated digoxin levels (1.2 on 7/20), consider decreasing dig dose by 50% (done) and recheck level at new steady state (ordered for 7/30am).  Goal of Therapy:  INR 2-3 Monitor platelets by anticoagulation protocol: Yes   Plan:   Repeat Coumadin 4 mg po x 1 tonight at 1800.  Daily PT/INR  Darrol Angel, PharmD Pager: [phone] 9:18 AM 04/13/2012

## 2012-04-13 NOTE — Progress Notes (Signed)
Patient [name]      DOB: Dec 23, 1927      ZOX:[id number]   Palliative Medicine Team at [facility] Progress Note    Subjective: Patient up in bedside chair. Is using Morphine for dyspnea with a little effectiveness per patient. Verbalized apprehension about leg weakness, has concerns about being    Filed Vitals:   04/13/12 0500  BP: 130/75  Pulse: 84  Temp: 97.7 F (36.5 C)  Resp: 20   Physical exam: General: Alert/oriented  HEENT: Anicteric, mouth moist  Chest: CTA bilaterally,  CVS: tachy, rhythm irregular.  Abdomen:soft, non-tender, audible bowel sounds  Ext: no edema or mottling  Assessment and plan:  Patient is an 78 yo WM with history of COPD, CAD, CHF, A-fib s/p pacer that was admitted 04/04/12 with cough and dyspnea. Initially required BIPAP. Admitted and treated for COPD exacerbation and atrial fibrillation with RVR   1) Code Status: DNR/DNI  2) Dyspnea: improved, encouraged patient to ask for Roxinol prior to activity 3) Leg Weakness: feels reservations about discharge to [facility] place because of this 3) Disposition: Plan for discharge to [facility] SNF for rehabilitation with Palliative care Services to follow   Time In Time Out Total Time Spent with Patient Total Overall Time  1:00p 1:20p 20 min 20 min   Greater than 50%  of this time was spent counseling and coordinating care related to the above assessment and plan.  Freddie Breech, CNS-C Palliative Medicine Team [facility] Health Team Phone: [phone] Pager: [phone]

## 2012-04-13 NOTE — Progress Notes (Signed)
04-13-12  NSG:  Have been able to wean pt to Room Air for the past 4 hours pt has been sustaining 92 -94% on ra.

## 2012-04-14 LAB — BASIC METABOLIC PANEL
Calcium: 8.8 mg/dL (ref 8.4–10.5)
GFR calc non Af Amer: 88 mL/min — ABNORMAL LOW (ref >90)
Sodium: 133 mEq/L — ABNORMAL LOW (ref 135–145)

## 2012-04-14 LAB — PROTIME-INR: Prothrombin Time: 26.5 seconds — ABNORMAL HIGH (ref 11.6–15.2)

## 2012-04-14 LAB — CBC
MCH: 29.8 pg (ref 26.0–34.0)
MCHC: 33.4 g/dL (ref 30.0–36.0)
Platelets: 162 10*3/uL (ref 150–400)

## 2012-04-14 LAB — PRO B NATRIURETIC PEPTIDE: Pro B Natriuretic peptide (BNP): 1629 pg/mL — ABNORMAL HIGH (ref 0–450)

## 2012-04-14 MED ORDER — INSULIN ASPART 100 UNIT/ML ~~LOC~~ SOLN: 6.0000 [IU] | Freq: Three times a day (TID) | SUBCUTANEOUS | Status: DC

## 2012-04-14 MED ORDER — AMIODARONE HCL 200 MG PO TABS: 200.0000 mg | ORAL_TABLET | Freq: Every day | ORAL | Status: DC

## 2012-04-14 MED ORDER — LEVALBUTEROL HCL 0.63 MG/3ML IN NEBU: 0.6300 mg | INHALATION_SOLUTION | Freq: Four times a day (QID) | RESPIRATORY_TRACT | Status: DC

## 2012-04-14 MED ORDER — DIGOXIN 125 MCG PO TABS: 0.1250 mg | ORAL_TABLET | Freq: Every day | ORAL | Status: DC

## 2012-04-14 MED ORDER — BUDESONIDE 0.5 MG/2ML IN SUSP: 0.5000 mg | Freq: Two times a day (BID) | RESPIRATORY_TRACT | Status: DC

## 2012-04-14 MED ORDER — INSULIN GLARGINE 100 UNIT/ML ~~LOC~~ SOLN: 22.0000 [IU] | Freq: Every day | SUBCUTANEOUS | Status: DC

## 2012-04-14 MED ORDER — IPRATROPIUM BROMIDE 0.02 % IN SOLN: 0.5000 mg | Freq: Four times a day (QID) | RESPIRATORY_TRACT | Status: DC

## 2012-04-14 MED ORDER — ZOLPIDEM TARTRATE 5 MG PO TABS: 5.0000 mg | ORAL_TABLET | Freq: Every evening | ORAL | Status: DC | PRN

## 2012-04-14 MED ORDER — ACETAMINOPHEN 325 MG PO TABS: 650.0000 mg | ORAL_TABLET | Freq: Four times a day (QID) | ORAL | Status: DC | PRN

## 2012-04-14 MED ORDER — MORPHINE SULFATE (CONCENTRATE) 20 MG/ML PO SOLN: 2.5000 mg | Freq: Four times a day (QID) | ORAL | Status: DC | PRN

## 2012-04-14 MED ORDER — PREDNISONE 20 MG PO TABS: 10.0000 mg | ORAL_TABLET | Freq: Every day | ORAL | Status: DC

## 2012-04-14 MED ORDER — GLUCERNA SHAKE PO LIQD: 237.0000 mL | Freq: Two times a day (BID) | ORAL | Status: DC

## 2012-04-14 MED ORDER — INSULIN ASPART 100 UNIT/ML ~~LOC~~ SOLN: 0.0000 [IU] | Freq: Three times a day (TID) | SUBCUTANEOUS | Status: DC

## 2012-04-14 MED ORDER — PREDNISONE 20 MG PO TABS
20.0000 mg | ORAL_TABLET | Freq: Every day | ORAL | Status: DC
  Administered 2012-04-14: 20 mg via ORAL
  Filled 2012-04-14 (×2): qty 1

## 2012-04-14 NOTE — Progress Notes (Signed)
Palliative Medicine Team SW Farewell visit with pt before d/c. Pt in good spirits, remains slightly apprehensive about, but agreeable with plan for d/c today. Affimed pt's positive outlook despite his frustrations with his physical limitations. 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 Pt appreciative for visit and support during admission. No further needs.   [name], [place] Pager [phone]

## 2012-04-14 NOTE — Progress Notes (Signed)
Notified MD on call of patient's 15 beat run of PVC's.  Patient denies chest pain or SOB.  No new orders at this time. Will continue to monitor patient. Manson Passey, Becki Mccaskill Cherie

## 2012-04-14 NOTE — Progress Notes (Signed)
Occupational Therapy Treatment Patient Details Name: [name] MRN: [id number] DOB: [birth year] Today's Date: 04/14/2012 Time: 4098-1191 OT Time Calculation (min): 20 min  OT Assessment / Plan / Recommendation Comments on Treatment Session      Follow Up Recommendations  Skilled nursing facility    Barriers to Discharge       Equipment Recommendations       Recommendations for Other Services    Frequency Min 2X/week   Plan      Precautions / Restrictions Precautions Precautions: Fall Precaution Comments: dyspnea, decreased BP, afib Restrictions Weight Bearing Restrictions: No   Pertinent Vitals/Pain No c/o pain.  Requested morphine for breathing    ADL  Grooming: Performed;Teeth care;Brushing hair;Set up Where Assessed - Grooming: Unsupported sitting Transfers/Ambulation Related to ADLs: Pt sat EOB x  8 minutes, performing grooming with set up.  Mod I for sitting balance while performing grooming tasks.  Pt used bed controls to assist in coming to sitting.  Got Morphine for breathing at beginning of session.  Pt plans STSNF today.  Scooted up HOB with max A.      OT Diagnosis:    OT Problem List:   OT Treatment Interventions:     OT Goals ADL Goals Pt Will Perform Grooming: with set-up;Sitting, edge of bed;Unsupported ADL Goal: Grooming - Progress: Met Miscellaneous OT Goals Miscellaneous OT Goal #1: pt will tolerate sitting eob for 5 minutes with unilateral support at mod independent level performing light adl activity OT Goal: Miscellaneous Goal #1 - Progress: Met  Visit Information  Last OT Received On: 04/14/12 Assistance Needed: +2    Subjective Data      Prior Functioning       Cognition  Overall Cognitive Status: Appears within functional limits for tasks assessed/performed Behavior During Session: [facility] for tasks performed    Mobility Bed Mobility Supine to Sit: 5: Supervision;Other (comment) (using bed control to lift upper body)   Exercises     Balance    End of Session OT - End of Session Activity Tolerance: Patient tolerated treatment well Patient left: in bed;with call bell/phone within reach  GO     [facility] 04/14/2012, 9:23 AM Marica Otter, OTR/L [phone] 04/14/2012

## 2012-04-14 NOTE — Progress Notes (Signed)
Patient is set to discharge back to [facility] today. Message left for patient's daughter, [name] (ph#: [phone]). [facility] called for 11am pickup.   Unice Bailey, LCSW [facility] Clinical Social Worker cell #: [phone]

## 2012-04-14 NOTE — Discharge Summary (Signed)
DISCHARGE SUMMARY  [name]  MR#: [id number]  DOB:Feb 05, 1928  Date of Admission: 04/04/2012 Date of Discharge: 04/14/2012  Attending Physician:Aundraya Dripps,[name]  Patient's NWG:NFAO,Z [name], MD  Consults:  Palliative [facility]  Discharge Diagnoses: Principal Problem:  *Acute-on-chronic respiratory failure Active Problems:  CHF (congestive heart failure)  CAD (coronary artery disease)  COPD exacerbation  Acute on chronic combined systolic and diastolic heart failure  Type 2 diabetes mellitus  Permanent atrial fibrillation  Chronic anticoagulation  E-coli UTI  Hyponatremia  Leg weakness, bilateral  Past Medical History  Diagnosis Date  . Pneumonia 12/06/10    healthcare-associated/ left lower lobe  . COPD (chronic obstructive pulmonary disease)     severe stage IV  . Atrial fibrillation     on Coumadin with St. Jude single-chamber pacemaker  . Diabetes mellitus     type 2; s/p Prednisone therapy for pneumonia 12/2010  . Coronary artery disease     s/p anterior STEMI 09/2009; cath. revealed mid LAD 40%, distal LAD 100%- PTCA, prox. RCA 40%, mid. RCA 100% with good collateral filling of PDA; EF 25%; NSTEMI 07/2011 - PCI/DES 100% LCX  . ST elevation (STEMI) myocardial infarction 01//11/12    anterior  . CHF (congestive heart failure)     EF 25% on 09/26/10; EF 50-55% and grade 1 diastolic dysfunction on ECHO 08/2010   . Hypertension   . Hyperlipidemia   . AAA (abdominal aortic aneurysm)     3 cm infrarenal abdominal aortic aneurysm per aorta ultrasound 03/2010  . Osteoarthritis     s/p bilat hip arthroplasty  . Angina   . Ischemic cardiomyopathy     EF 35% LHC 11/12  . GERD (gastroesophageal reflux disease)   . Hypercholesteremia   . PVD (peripheral vascular disease)   . Anxiety    Past Surgical History  Procedure Date  . Hip arthroplasty     s/p right 09/27/10 and s/p left 09/24/10  . Back surgery   . Knee surgery   . Pacemaker insertion 12/2010    St.  Jude single-chamber   . Cardiac catheterization 09/2009, 06/2007    PTCA to distal LAD    Discharge Medications: Medication List  As of 04/14/2012  7:39 AM   STOP taking these medications         budesonide 0.25 MG/2ML nebulizer solution      levalbuterol 45 MCG/ACT inhaler      morphine 15 MG 12 hr tablet      neomycin-bacitracin-polymyxin ointment      Potassium Gluconate 550 MG Tabs         TAKE these medications         acetaminophen 325 MG tablet   Commonly known as: TYLENOL   Take 2 tablets (650 mg total) by mouth every 6 (six) hours as needed (or Fever >/= 101).      amiodarone 200 MG tablet   Commonly known as: PACERONE   Take 1 tablet (200 mg total) by mouth daily.      bisoprolol 10 MG tablet   Commonly known as: ZEBETA   Take 1 tablet (10 mg total) by mouth daily.      budesonide 0.5 MG/2ML nebulizer solution   Commonly known as: PULMICORT   Take 2 mLs (0.5 mg total) by nebulization 2 (two) times daily.      cholecalciferol 1000 UNITS tablet   Commonly known as: VITAMIN D   Take 2,000 Units by mouth daily.      clopidogrel 75 MG  tablet   Commonly known as: PLAVIX   Take 1 tablet (75 mg total) by mouth daily with breakfast.      digoxin 0.125 MG tablet   Commonly known as: LANOXIN   Take 1 tablet (0.125 mg total) by mouth daily.      diltiazem 360 MG 24 hr capsule   Commonly known as: CARDIZEM CD   Take 1 capsule (360 mg total) by mouth daily.      feeding supplement Liqd   Take 237 mLs by mouth 2 (two) times daily between meals.      insulin aspart 100 UNIT/ML injection   Commonly known as: novoLOG   Inject 6 Units into the skin 3 (three) times daily with meals.      insulin aspart 100 UNIT/ML injection   Commonly known as: novoLOG   Inject 0-15 Units into the skin 3 (three) times daily with meals.      insulin glargine 100 UNIT/ML injection   Commonly known as: LANTUS   Inject 22 Units into the skin daily.      ipratropium 0.02 %  nebulizer solution   Commonly known as: ATROVENT   Take 2.5 mLs (0.5 mg total) by nebulization every 6 (six) hours.      isosorbide mononitrate 60 MG 24 hr tablet   Commonly known as: IMDUR   Take 60 mg by mouth daily.      levalbuterol 0.63 MG/3ML nebulizer solution   Commonly known as: XOPENEX   Take 3 mLs (0.63 mg total) by nebulization every 6 (six) hours.      morphine 20 MG/ML concentrated solution   Commonly known as: ROXANOL   Take 0.13 mLs (2.6 mg total) by mouth every 6 (six) hours as needed.      nitroGLYCERIN 0.4 MG SL tablet   Commonly known as: NITROSTAT   Place 0.4 mg under the tongue every 5 (five) minutes as needed. For chest pain      pantoprazole 40 MG tablet   Commonly known as: PROTONIX   Take 1 tablet (40 mg total) by mouth daily at 12 noon.      predniSONE 20 MG tablet   Commonly known as: DELTASONE   Take 0.5 tablets (10 mg total) by mouth daily. Take 2 pills daily X 3 days, then 1 pill daily X 3 days, then 1/2 pill daily indefinately      rosuvastatin 20 MG tablet   Commonly known as: CRESTOR   Take 20 mg by mouth daily.      sennosides-docusate sodium 8.6-50 MG tablet   Commonly known as: SENOKOT-S   Take 1 tablet by mouth at bedtime.      Tamsulosin HCl 0.4 MG Caps   Commonly known as: FLOMAX   Take 0.4 mg by mouth daily.      warfarin 2.5 MG tablet   Commonly known as: COUMADIN   Take 1 tablet (2.5 mg total) by mouth one time only at 6 PM.      zolpidem 5 MG tablet   Commonly known as: AMBIEN   Take 1 tablet (5 mg total) by mouth at bedtime as needed for sleep (insomnia).         ASK your doctor about these medications         furosemide 20 MG tablet   Commonly known as: LASIX   Take 3 tablets (60 mg total) by mouth daily.      guaiFENesin 600 MG 12 hr tablet   Commonly known  as: MUCINEX   Take 600 mg by mouth 2 (two) times daily.            Hospital Procedures: Dg Chest Portable 1 View  04/04/2012  *RADIOLOGY REPORT*   Clinical Data: Shortness of breath.  Respiratory distress.  PORTABLE CHEST - 1 VIEW  Comparison: Chest x-ray 03/10/2012.  Findings: Blunting of the right costophrenic sulcus suggesting a small right-sided pleural effusion.  No definite consolidative airspace disease.  Retrocardiac opacity favored to represent subsegmental atelectasis.  Pulmonary vasculature and the cardiomediastinal silhouette are within normal limits. Atherosclerosis in the thoracic aorta.  Left-sided pacemaker device in place with lead tip projecting over the expected location of the right ventricular apex.  IMPRESSION: 1.  Small right-sided pleural effusion. 2.  Subsegmental atelectasis in the left lower lobe. 3.  Atherosclerosis.  Original Report Authenticated By: Florencia Reasons, M.D.    History of Present Illness: The patient is an 78 year old man who is a resident of a skilled nursing facility since earlier this month after a hospital admission because of chest pain and dyspnea .He had been admitted to the hospital on June 25 and discharged on March 19, 2012. On admission he had atrial fibrillation with a rapid ventricular rate and was started on IV Cardizem. His heart rate improved and chest discomfort resolved. Serial cardiac enzymes were negative. Medical treatment with bisoprolol and Cardizem was adjusted. His condition improved and he was discharged to a skilled nursing facility for rehabilitation. His medical history is most significant for ischemic cardiomyopathy with left ventricular ejection fraction of 35% with chronic systolic congestive heart failure, coronary artery disease, dyslipidemia, diabetes mellitus, type II, atherosclerotic peripheral vascular disease, end-stage COPD for which he has a do not resuscitate and do not intubate order, and atrial fibrillation. At the skilled nursing facility he began to have increasing dyspnea yesterday and medications were adjusted. However, his dyspnea gradually worsened resulting in  urgent trip to the emergency room for evaluation. In the emergency room he had severe dyspnea and was started on BiPAP support. His dyspnea has improved with this. He is able to answer questions by nodding his head. Lately he has not been trouble by fever or chills or productive cough. He has had some mild constipation but has not had nausea, vomiting, or painful urination. He would like to continue his do not resuscitate order.   Hospital Course: Mr. Gettis was admitted to an ICU bed where he was treated with BiPAP, IV steroids, IV antibiotics, diuretics and nebulizer treatments for a COPD exacerbation, acute on chronic systolic heart failure, and A. fib with RVR. He remained tachycardic despite maximum doses of a Cardizem drip. Cardiology was consulted and recommended amiodarone which helped significantly to reduce his rate. As his respiratory status improved, his tachycardia improved. His diuretics have been held due to transient hyponatremia and mild volume depletion. He appears to be remaining euvolemic off of diuretic therapy at this time. The patient completed 10 days of antibiotics including 7 days of imipenem for a urine culture growing ESBL Escherichia coli. His respiratory status has improved significantly and he is no longer wheezing. As weaned to room air but feels better with oxygen on which will be continued as needed. Steroids have been tapered and he is on scheduled nebulizer treatments. I do recommend that we maintain him on 5 mg of prednisone indefinitely to help with his COPD. Since this was his fourth hospitalization since 11/12 for respiratory failure in the setting of his severe COPD, hospice/palliative  care was consulted to assist with symptom management for his end-stage COPD. They recommended continued palliative care services at skilled nursing rehabilitation and Roxanol for pain and respiratory distress. At this point, the patient is stable for discharge to a skilled nursing  facility for rehabilitation. He has severe bilateral lower extremity weakness and fear of falling. He is requiring 2 person assist to stand or ambulate. However, I am optimistic that with rehabilitation he will be able to eventually return to independent living at [facility] where he was living prior to his most recent hospitalization. At that time, it may be appropriate to initiate outpatient hospice services for his end end-stage COPD.    Day of Discharge Exam BP 122/72  Pulse 63  Temp 97.6 F (36.4 C) (Oral)  Resp 19  Ht 5\' 9"  (1.753 m)  Wt 70.3 kg (154 lb 15.7 oz)  BMI 22.89 kg/m2  SpO2 100%  Physical Exam: General appearance: alert and no distress Eyes: no scleral icterus Throat: oropharynx moist without erythema Resp: clear to auscultation bilaterally Cardio: irregularly irregular rhythm GI: soft, non-tender; bowel sounds normal; no masses,  no organomegaly Extremities: no clubbing, cyanosis or edema  Discharge Labs:  [facility] 04/14/12 0421 04/13/12 0418  NA 133* 135  K 5.4* 5.4*  CL 99 99  CO2 30 30  GLUCOSE 206* 136*  BUN 22 20  CREATININE 0.67 0.56  CALCIUM 8.8 8.9  MG -- --  PHOS -- --   No results found for this basename: AST:2,ALT:2,ALKPHOS:2,BILITOT:2,PROT:2,ALBUMIN:2 in the last 72 hours  Basename 04/14/12 0421 04/13/12 0418  WBC 12.5* 13.9*  NEUTROABS -- --  HGB 13.5 14.0  HCT 40.4 41.6  MCV 89.2 88.3  PLT 162 174   Lab Results  Component Value Date   INR 2.39* 04/14/2012   INR 1.86* 04/13/2012   INR 1.83* 04/12/2012   Digoxin level 0.9  Discharge instructions: Discharge Orders    Future Appointments: Provider: Department: Dept Phone: Center:   04/17/2012 11:45 AM Cassell Clement, MD [facility] [phone] None   05/07/2012 1:45 PM Kalman Shan, MD [facility] [phone] None   05/22/2012 4:00 PM Marinus Maw, MD [facility] [phone] [id number]     Future Orders Please Complete By Expires   Diet - low sodium  heart healthy      Scheduling Instructions:   Carb Modified Medium   Increase activity slowly      Discharge instructions      Comments:   Patient requires 2 person assist for transfers.  Continue PT/OT.  Give Roxanol prior to therapy to help with breathing and pain.  Continue O2 2 liters as needed.  Check INR in 2 days and adjust coumadin dose per protocol to keep INR 2-3.      Disposition: to [facility] SN[facility] Rehab with palliative care services  Follow-up Appts: Follow-up with Dr. [name] at [facility] within one week after discharge from skilled nursing facility.  Call for appointment.  Condition on Discharge: Improved  Tests Needing Follow-up: PT/INR in 2 days- adjust Coumadin to keep INR 2-3; CBG qAC and qhs  Time with Discharge Activities: 40 minutes  Signed: Debbie Yearick,[name] 04/14/2012, 7:39 AM

## 2012-04-17 ENCOUNTER — Encounter: Payer: Self-pay | Admitting: Cardiology

## 2012-04-17 ENCOUNTER — Ambulatory Visit (INDEPENDENT_AMBULATORY_CARE_PROVIDER_SITE_OTHER): Payer: Medicare Other | Admitting: Cardiology

## 2012-04-17 VITALS — BP 92/55 | HR 65 | Ht 69.0 in

## 2012-04-17 DIAGNOSIS — I4821 Permanent atrial fibrillation: Secondary | ICD-10-CM

## 2012-04-17 DIAGNOSIS — J449 Chronic obstructive pulmonary disease, unspecified: Secondary | ICD-10-CM

## 2012-04-17 DIAGNOSIS — I4891 Unspecified atrial fibrillation: Secondary | ICD-10-CM

## 2012-04-17 NOTE — Progress Notes (Signed)
[name] Date of Birth:  02/10/1928 [facility] [address] [place], [place]  [address] [phone]  Fax   [phone]  HPI: This pleasant gentleman was discharged only 3 days ago from [facility] where he was admitted with exacerbation of COPD and atrial fibrillation.  While he was in the hospital this time he was seen by hospice and palliative care who are now involved in his care.  He is now at [facility] place for about a month to try to get some physical therapy before he returns to his apartment at [facility] [place].  Since discharge 3 days ago he has had no new cardiac symptoms.  Current Outpatient Prescriptions  Medication Sig Dispense Refill  . acetaminophen (TYLENOL) 325 MG tablet Take 2 tablets (650 mg total) by mouth every 6 (six) hours as needed (or Fever >/= 101).      Marland Kitchen amiodarone (PACERONE) 200 MG tablet Take 1 tablet (200 mg total) by mouth daily.  30 tablet  1  . bisoprolol (ZEBETA) 10 MG tablet Take 1 tablet (10 mg total) by mouth daily.  30 tablet  3  . budesonide (PULMICORT) 0.5 MG/2ML nebulizer solution Take 2 mLs (0.5 mg total) by nebulization 2 (two) times daily.  120 mL  1  . cholecalciferol (VITAMIN D) 1000 UNITS tablet Take 2,000 Units by mouth daily.      . clopidogrel (PLAVIX) 75 MG tablet Take 1 tablet (75 mg total) by mouth daily with breakfast.  30 tablet  6  . digoxin (LANOXIN) 0.125 MG tablet Take 1 tablet (0.125 mg total) by mouth daily.  30 tablet  1  . diltiazem (CARDIZEM CD) 360 MG 24 hr capsule Take 1 capsule (360 mg total) by mouth daily.  30 capsule  3  . feeding supplement (GLUCERNA SHAKE) LIQD Take 237 mLs by mouth 2 (two) times daily between meals.      . insulin aspart (NOVOLOG) 100 UNIT/ML injection Inject 6 Units into the skin 3 (three) times daily with meals.  1 vial  3  . insulin aspart (NOVOLOG) 100 UNIT/ML injection Inject 0-15 Units into the skin 3 (three) times daily with meals.  1 vial  3  . insulin  glargine (LANTUS) 100 UNIT/ML injection Inject 22 Units into the skin daily.  10 mL  3  . ipratropium (ATROVENT) 0.02 % nebulizer solution Take 2.5 mLs (0.5 mg total) by nebulization every 6 (six) hours.  75 mL  1  . isosorbide mononitrate (IMDUR) 60 MG 24 hr tablet Take 60 mg by mouth daily.        Marland Kitchen levalbuterol (XOPENEX) 0.63 MG/3ML nebulizer solution Take 3 mLs (0.63 mg total) by nebulization every 6 (six) hours.  3 mL  1  . morphine (ROXANOL) 20 MG/ML concentrated solution Take 0.13 mLs (2.6 mg total) by mouth every 6 (six) hours as needed.  30 mL  0  . nitroGLYCERIN (NITROSTAT) 0.4 MG SL tablet Place 0.4 mg under the tongue every 5 (five) minutes as needed. For chest pain      . pantoprazole (PROTONIX) 40 MG tablet Take 1 tablet (40 mg total) by mouth daily at 12 noon.  30 tablet  3  . predniSONE (DELTASONE) 20 MG tablet Take 0.5 tablets (10 mg total) by mouth daily. Take 2 pills daily X 3 days, then 1 pill daily X 3 days, then 1/2 pill daily indefinately  30 tablet  3  . rosuvastatin (CRESTOR) 20 MG tablet Take 20 mg  by mouth daily.      . sennosides-docusate sodium (SENOKOT-S) 8.6-50 MG tablet Take 1 tablet by mouth at bedtime.      . Tamsulosin HCl (FLOMAX) 0.4 MG CAPS Take 0.4 mg by mouth daily.      Marland Kitchen warfarin (COUMADIN) 2.5 MG tablet Take 1 tablet (2.5 mg total) by mouth one time only at 6 PM.  30 tablet  3  . zolpidem (AMBIEN) 5 MG tablet Take 1 tablet (5 mg total) by mouth at bedtime as needed for sleep (insomnia).  30 tablet  1    No Known Allergies  Patient Active Problem List  Diagnosis  . CHF (congestive heart failure)  . Abdominal aortic aneurysm  . Emphysema/COPD  . Pacemaker  . Post-nasal drip  . Unstable angina  . NSTEMI (non-ST elevated myocardial infarction)  . CAD (coronary artery disease)  . Dyspnea  . HLD (hyperlipidemia)  . COPD exacerbation  . Spiritual Distress  . Acute on chronic combined systolic and diastolic heart failure  . Acute-on-chronic  respiratory failure  . Hypoxemia  . Bronchitis  . Pulmonary edema  . Type 2 diabetes mellitus  . Chest pain  . COPD (chronic obstructive pulmonary disease)  . Permanent atrial fibrillation  . Depression  . Chronic anticoagulation  . E-coli UTI  . Hyponatremia  . Leg weakness, bilateral    History  Smoking status  . Former Smoker -- 2.0 packs/day for 50 years  . Types: Cigarettes  . Quit date: 09/02/2009  Smokeless tobacco  . Never Used  Comment: smoked for 61yrs quit for 109yrs started back & smoked for 10ys    History  Alcohol Use No    occ - alcohol    Family History  Problem Relation Age of Onset  . Heart attack Mother 50  . Heart attack Father 36  . Cancer Other     siblings  . Heart attack Other     Review of Systems: The patient denies any heat or cold intolerance.  No weight gain or weight loss.  The patient denies headaches or blurry vision.  There is no cough or sputum production.  The patient denies dizziness.  There is no hematuria or hematochezia.  The patient denies any muscle aches or arthritis.  The patient denies any rash.  The patient denies frequent falling or instability.  There is no history of depression or anxiety.  All other systems were reviewed and are negative.   Physical Exam: Filed Vitals:   04/17/12 1228  BP: 92/55  Pulse: 65   general appearance reveals a very weak gentleman who arrives in a wheelchair.  He states that he has difficulty standing without assistance.  Physical therapy so far has been going very slowly for him.  Normal jugular venous pressure.  The chest reveals mild expiratory rhonchi.  The heart reveals no gallop and the rhythm is irregular.The abdomen is soft and nontender. Bowel sounds are normal. The liver and spleen are not enlarged. There Are no abdominal masses. There are no bruits.  Extremities show no phlebitis or edema    Assessment / Plan: The patient is to continue his current therapy.  He will followup  with Dr. Eric Form in about a month.  We will plan to see him in about 3 months.  Palliative care will continue to work with him also.

## 2012-04-17 NOTE — Patient Instructions (Addendum)
Your physician recommends that you continue on your current medications as directed. Please refer to the Current Medication list given to you today.  Your physician recommends that you schedule a follow-up appointment in: 3 months  

## 2012-04-17 NOTE — Assessment & Plan Note (Signed)
During his last hospitalization we added amiodarone 200 mg daily for  additional rate control.  He is tolerating the amiodarone without any side effects

## 2012-04-20 ENCOUNTER — Emergency Department (HOSPITAL_COMMUNITY): Payer: Medicare Other

## 2012-04-20 ENCOUNTER — Encounter (HOSPITAL_COMMUNITY): Payer: Self-pay | Admitting: Emergency Medicine

## 2012-04-20 ENCOUNTER — Emergency Department (HOSPITAL_COMMUNITY)
Admission: EM | Admit: 2012-04-20 | Discharge: 2012-04-20 | Disposition: A | Discharge status: Skilled Nursing Facility | Payer: Medicare Other | Attending: Emergency Medicine | Admitting: Emergency Medicine

## 2012-04-20 DIAGNOSIS — F411 Generalized anxiety disorder: Secondary | ICD-10-CM | POA: Insufficient documentation

## 2012-04-20 DIAGNOSIS — I2510 Atherosclerotic heart disease of native coronary artery without angina pectoris: Secondary | ICD-10-CM | POA: Insufficient documentation

## 2012-04-20 DIAGNOSIS — J449 Chronic obstructive pulmonary disease, unspecified: Secondary | ICD-10-CM | POA: Insufficient documentation

## 2012-04-20 DIAGNOSIS — I509 Heart failure, unspecified: Secondary | ICD-10-CM | POA: Insufficient documentation

## 2012-04-20 DIAGNOSIS — Z79899 Other long term (current) drug therapy: Secondary | ICD-10-CM | POA: Insufficient documentation

## 2012-04-20 DIAGNOSIS — I4891 Unspecified atrial fibrillation: Secondary | ICD-10-CM | POA: Insufficient documentation

## 2012-04-20 DIAGNOSIS — Z7901 Long term (current) use of anticoagulants: Secondary | ICD-10-CM | POA: Insufficient documentation

## 2012-04-20 DIAGNOSIS — K219 Gastro-esophageal reflux disease without esophagitis: Secondary | ICD-10-CM | POA: Insufficient documentation

## 2012-04-20 DIAGNOSIS — Z87891 Personal history of nicotine dependence: Secondary | ICD-10-CM | POA: Insufficient documentation

## 2012-04-20 DIAGNOSIS — E119 Type 2 diabetes mellitus without complications: Secondary | ICD-10-CM | POA: Insufficient documentation

## 2012-04-20 DIAGNOSIS — I10 Essential (primary) hypertension: Secondary | ICD-10-CM | POA: Insufficient documentation

## 2012-04-20 DIAGNOSIS — J4489 Other specified chronic obstructive pulmonary disease: Secondary | ICD-10-CM | POA: Insufficient documentation

## 2012-04-20 LAB — CBC WITH DIFFERENTIAL/PLATELET
Basophils Absolute: 0 10*3/uL (ref 0.0–0.1)
HCT: 34.9 % — ABNORMAL LOW (ref 39.0–52.0)
Lymphs Abs: 0.7 10*3/uL (ref 0.7–4.0)
MCV: 90.6 fL (ref 78.0–100.0)
Monocytes Relative: 6 % (ref 3–12)
Neutro Abs: 7.5 10*3/uL (ref 1.7–7.7)
RDW: 14.1 % (ref 11.5–15.5)
WBC: 8.7 10*3/uL (ref 4.0–10.5)

## 2012-04-20 LAB — POCT I-STAT, CHEM 8
Chloride: 99 mEq/L (ref 96–112)
Creatinine, Ser: 0.7 mg/dL (ref 0.50–1.35)
Glucose, Bld: 134 mg/dL — ABNORMAL HIGH (ref 70–99)
Hemoglobin: 11.6 g/dL — ABNORMAL LOW (ref 13.0–17.0)
Potassium: 4.5 mEq/L (ref 3.5–5.1)

## 2012-04-20 MED ORDER — ALBUTEROL (5 MG/ML) CONTINUOUS INHALATION SOLN
10.0000 mg/h | INHALATION_SOLUTION | RESPIRATORY_TRACT | Status: AC
  Administered 2012-04-20: 10 mg/h via RESPIRATORY_TRACT

## 2012-04-20 MED ORDER — METHYLPREDNISOLONE SODIUM SUCC 125 MG IJ SOLR
125.0000 mg | Freq: Once | INTRAMUSCULAR | Status: AC
  Administered 2012-04-20: 125 mg via INTRAVENOUS
  Filled 2012-04-20: qty 2

## 2012-04-20 MED ORDER — PREDNISONE 20 MG PO TABS: 40.0000 mg | ORAL_TABLET | Freq: Every day | ORAL | Status: AC

## 2012-04-20 MED ORDER — ALBUTEROL SULFATE (5 MG/ML) 0.5% IN NEBU
5.0000 mg | INHALATION_SOLUTION | Freq: Once | RESPIRATORY_TRACT | Status: AC
  Administered 2012-04-20: 5 mg via RESPIRATORY_TRACT
  Filled 2012-04-20: qty 1

## 2012-04-20 MED ORDER — MOXIFLOXACIN HCL 400 MG PO TABS: 400.0000 mg | ORAL_TABLET | Freq: Every day | ORAL | Status: AC

## 2012-04-20 MED ORDER — MOXIFLOXACIN HCL IN NACL 400 MG/250ML IV SOLN
400.0000 mg | Freq: Once | INTRAVENOUS | Status: AC
  Administered 2012-04-20: 400 mg via INTRAVENOUS
  Filled 2012-04-20: qty 250

## 2012-04-20 NOTE — ED Provider Notes (Signed)
History     CSN: [id number]  Arrival date & time 04/20/12  0009   First MD Initiated Contact with Patient 04/20/12 0016      Chief Complaint  Patient presents with  . Shortness of Breath    (Consider location/radiation/quality/duration/timing/severity/associated sxs/prior treatment) HPI Comments: 78 year old male with COPD and congestive heart failure who is also on digoxin percent with a complaint of shortness of breath. He states this has been ongoing throughout the day, gradually getting worse, associated with a cough but states that he is unable to get up any phlegm. He denies any chest pain or back pain and has no swelling in his legs. He has been given albuterol treatments at his nursing rehabilitation facility with minimal improvement. Symptoms are moderate at this time.  Patient is a 78 y.o. male presenting with shortness of breath. The history is provided by the patient, medical records and the EMS personnel.  Shortness of Breath  Associated symptoms include shortness of breath.    Past Medical History  Diagnosis Date  . Pneumonia 12/06/10    healthcare-associated/ left lower lobe  . COPD (chronic obstructive pulmonary disease)     severe stage IV  . Atrial fibrillation     on Coumadin with St. Jude single-chamber pacemaker  . Diabetes mellitus     type 2; s/p Prednisone therapy for pneumonia 12/2010  . Coronary artery disease     s/p anterior STEMI 09/2009; cath. revealed mid LAD 40%, distal LAD 100%- PTCA, prox. RCA 40%, mid. RCA 100% with good collateral filling of PDA; EF 25%; NSTEMI 07/2011 - PCI/DES 100% LCX  . ST elevation (STEMI) myocardial infarction 01//11/12    anterior  . CHF (congestive heart failure)     EF 25% on 09/26/10; EF 50-55% and grade 1 diastolic dysfunction on ECHO 08/2010   . Hypertension   . Hyperlipidemia   . AAA (abdominal aortic aneurysm)     3 cm infrarenal abdominal aortic aneurysm per aorta ultrasound 03/2010  . Osteoarthritis     s/p  bilat hip arthroplasty  . Angina   . Ischemic cardiomyopathy     EF 35% LHC 11/12  . GERD (gastroesophageal reflux disease)   . Hypercholesteremia   . PVD (peripheral vascular disease)   . Anxiety     Past Surgical History  Procedure Date  . Hip arthroplasty     s/p right 09/27/10 and s/p left 09/24/10  . Back surgery   . Knee surgery   . Pacemaker insertion 12/2010    St. Jude single-chamber   . Cardiac catheterization 09/2009, 06/2007    PTCA to distal LAD    Family History  Problem Relation Age of Onset  . Heart attack Mother 15  . Heart attack Father 49  . Cancer Other     siblings  . Heart attack Other     History  Substance Use Topics  . Smoking status: Former Smoker -- 2.0 packs/day for 50 years    Types: Cigarettes    Quit date: 09/02/2009  . Smokeless tobacco: Never Used   Comment: smoked for 61yrs quit for 18yrs started back & smoked for 10ys  . Alcohol Use: No     occ - alcohol      Review of Systems  Respiratory: Positive for shortness of breath.   All other systems reviewed and are negative.    Allergies  Review of patient's allergies indicates no known allergies.  Home Medications   Current Outpatient Rx  Name Route  Sig Dispense Refill  . AMIODARONE HCL 200 MG PO TABS Oral Take 1 tablet (200 mg total) by mouth daily. 30 tablet 1  . BISOPROLOL FUMARATE 10 MG PO TABS Oral Take 1 tablet (10 mg total) by mouth daily. 30 tablet 3  . BUDESONIDE 0.5 MG/2ML IN SUSP Nebulization Take 2 mLs (0.5 mg total) by nebulization 2 (two) times daily. 120 mL 1  . VITAMIN D 1000 UNITS PO TABS Oral Take 2,000 Units by mouth daily.    Marland Kitchen CLOPIDOGREL BISULFATE 75 MG PO TABS Oral Take 1 tablet (75 mg total) by mouth daily with breakfast. 30 tablet 6  . DIGOXIN 0.125 MG PO TABS Oral Take 1 tablet (0.125 mg total) by mouth daily. 30 tablet 1  . DILTIAZEM HCL ER COATED BEADS 360 MG PO CP24 Oral Take 1 capsule (360 mg total) by mouth daily. 30 capsule 3  . GLUCERNA  SHAKE PO LIQD Oral Take 237 mLs by mouth 2 (two) times daily between meals.    . INSULIN ASPART 100 UNIT/ML [place] SOLN Subcutaneous Inject 6 Units into the skin 3 (three) times daily with meals. 1 vial 3  . INSULIN ASPART 100 UNIT/ML [place] SOLN Subcutaneous Inject 2-6 Units into the skin 3 (three) times daily before meals. Sliding scale 2-6units    . INSULIN GLARGINE 100 UNIT/ML [place] SOLN Subcutaneous Inject 22 Units into the skin daily. 10 mL 3  . IPRATROPIUM BROMIDE 0.02 % IN SOLN Nebulization Take 2.5 mLs (0.5 mg total) by nebulization every 6 (six) hours. 75 mL 1  . ISOSORBIDE MONONITRATE ER 60 MG PO TB24 Oral Take 60 mg by mouth daily.      Marland Kitchen LEVALBUTEROL HCL 0.63 MG/3ML IN NEBU Nebulization Take 3 mLs (0.63 mg total) by nebulization every 6 (six) hours. 3 mL 1  . PANTOPRAZOLE SODIUM 40 MG PO TBEC Oral Take 1 tablet (40 mg total) by mouth daily at 12 noon. 30 tablet 3  . PREDNISONE 20 MG PO TABS Oral Take 0.5 tablets (10 mg total) by mouth daily. Take 2 pills daily X 3 days, then 1 pill daily X 3 days, then 1/2 pill daily indefinately 30 tablet 3  . ROSUVASTATIN CALCIUM 20 MG PO TABS Oral Take 20 mg by mouth daily.    . SENNA-DOCUSATE SODIUM 8.6-50 MG PO TABS Oral Take 1 tablet by mouth at bedtime.    . TAMSULOSIN HCL 0.4 MG PO CAPS Oral Take 0.4 mg by mouth daily.    . WARFARIN SODIUM 2.5 MG PO TABS Oral Take 1 tablet (2.5 mg total) by mouth one time only at 6 PM. 30 tablet 3  . ZOLPIDEM TARTRATE 5 MG PO TABS Oral Take 1 tablet (5 mg total) by mouth at bedtime as needed for sleep (insomnia). 30 tablet 1  . ACETAMINOPHEN 325 MG PO TABS Oral Take 2 tablets (650 mg total) by mouth every 6 (six) hours as needed (or Fever >/= 101).    . MORPHINE SULFATE (CONCENTRATE) 20 MG/ML PO SOLN Oral Take 0.13 mLs (2.6 mg total) by mouth every 6 (six) hours as needed. 30 mL 0  . NITROGLYCERIN 0.4 MG SL SUBL Sublingual Place 0.4 mg under the tongue every 5 (five) minutes as needed. For chest pain      BP 103/75   Pulse 92  Temp 98.3 F (36.8 C) (Oral)  Resp 21  Ht 5\' 9"  (1.753 m)  Wt 154 lb (69.854 kg)  BMI 22.74 kg/m2  SpO2 97%  Physical Exam  Nursing note and vitals reviewed. Constitutional: He appears well-developed and well-nourished.       Increased respiratory rate  HENT:  Head: Normocephalic and atraumatic.  Mouth/Throat: Oropharynx is clear and moist. No oropharyngeal exudate.  Eyes: Conjunctivae and EOM are normal. Pupils are equal, round, and reactive to light. Right eye exhibits no discharge. Left eye exhibits no discharge. No scleral icterus.  Neck: Normal range of motion. Neck supple. No JVD present. No thyromegaly present.  Cardiovascular: Normal rate, regular rhythm, normal heart sounds and intact distal pulses.  Exam reveals no gallop and no friction rub.   No murmur heard. Pulmonary/Chest: He is in respiratory distress. He has wheezes. He has no rales.       Decreased air movement bilaterally, slight expiratory wheeze, mild accessory muscle use, increased respiratory rate  Abdominal: Soft. Bowel sounds are normal. He exhibits no distension and no mass. There is no tenderness.  Musculoskeletal: Normal range of motion. He exhibits no edema and no tenderness.  Lymphadenopathy:    He has no cervical adenopathy.  Neurological: He is alert. Coordination normal.  Skin: Skin is warm and dry. No rash noted. No erythema.  Psychiatric: He has a normal mood and affect. His behavior is normal.    ED Course  Procedures (including critical care time)  Labs Reviewed  CBC WITH DIFFERENTIAL - Abnormal; Notable for the following:    RBC 3.85 (*)     Hemoglobin 11.7 (*)     HCT 34.9 (*)     Platelets 144 (*)     Neutrophils Relative 86 (*)     Lymphocytes Relative 8 (*)     All other components within normal limits  POCT I-STAT, CHEM 8 - Abnormal; Notable for the following:    Sodium 134 (*)     Glucose, Bld 134 (*)     Hemoglobin 11.6 (*)     HCT 34.0 (*)     All other components  within normal limits  DIGOXIN LEVEL   Dg Chest Port 1 View  04/20/2012  *RADIOLOGY REPORT*  Clinical Data: Shortness of breath, COPD, coronary disease, pacer  PORTABLE CHEST - 1 VIEW  Comparison: 04/04/2012  Findings: Stable mild cardiac enlargement without CHF or definite pneumonia.  Chronic blunting of the right costophrenic angle may represent scarring versus small effusion.  Hyperinflation noted compatible with background COPD/emphysema.  Left subclavian single lead pacer noted.  Atherosclerosis of the aorta.  Overall, no significant change.  IMPRESSION: Stable portable chest exam.  Original Report Authenticated By: Judie Petit. Ruel Favors, M.D.     No diagnosis found.    MDM  There is no peripheral edema nor is there jugular venous distention. He has no rales on exam but does have tight wheezing lungs consistent with COPD exacerbation. On room air his oxygen level is 94%, 2 L supplemental given. EKG unremarkable for new findings, check a chest x-ray which secondary to his increased cough, albuterol treatment which is a continuous treatment over the next hour as well as steroids ordered. Will recheck frequently.  ED ECG REPORT  I personally interpreted this EKG   Date: 04/20/2012   Rate: 60  Rhythm: Electronically paced  QRS Axis: normal  Intervals: Underlying atrial fibrillation with ventricular paced rhythm  ST/T Wave abnormalities: nonspecific ST/T changes  Conduction Disutrbances:Paced rhythm  Narrative Interpretation:   Old EKG Reviewed: Compared with EKG from 04/04/2012, atrial fibrillation with ectopy replaced with paced rhythm.   Discussed with internist on call for Dr. [name] who  will see the patient in the emergency department shortly. We'll give another albuterol nebulized treatment, Avelox, unsure if needs admission at this time. He has improved but oxygen saturations are still 95% and is still mildly tachypneic. He is speaking in full sentences and is not using accessory muscles  anymore.  I personally evaluated his chest x-ray and find her to be no significant infiltrates , there is no leukocytosis, no significant anemia and no significant electrolyte abnormalities. His digoxin level is stable  Change of shift - care signed out to Dr. Lynelle Doctor pending GMA assessment.  Abx, nebs, steroids given.    Vida Roller, MD 04/20/12 [phone]

## 2012-04-20 NOTE — ED Notes (Signed)
Pt seen by Dr. Tawni Levy - wants pt d/c home.  Steroids, nebs, avelox, f/u as needed.  He feels pt is at baseline.  Vida Roller, MD 04/20/12 [phone]

## 2012-04-20 NOTE — ED Notes (Signed)
[id number]> Expected date:<BR> Expected time:<BR> Means of arrival:<BR> Comments:<BR> Medic 211, 83 M, SOB, Fever, Rm 7 or 8

## 2012-04-20 NOTE — Consult Note (Signed)
PCP:   Kari Baars, MD   Chief Complaint:  shortness of breath  HPI:  patient is a gentleman well-known to Dr. [name] service medical problems including acute on chronic respiratory failure with severe end-stage COPD, congestive heart failure chronic systolic and diastolic, coronary artery disease, pacemaker, and diabetes mellitus type 2 who presents upon transfer from [facility] with exacerbation as of breath in respiratory distress. Just discharged on July 30 [place] had been near even prior to this admission following a cardiac admission. Stage IV severe COPD oxygen and steroid dependent and has excellent insight, he is a DO NOT RESUSCITATE DO NOT INTUBATE. He additionally has fairly extensive cardiac disease to include myocardial infarction 2011, diffuse disease with pacemaker and repeat MRI 2012. His most recent ejection fraction is only 25% and he as well has diastolic dysfunction. High level of function is fairly sedentary as he admits is able to do very little in terms of rehabilitation and has excellent insight that his overall prognosis is poor. He does relate that he just does not want to suffer. Evidently he developed significant dyspnea that they were unable to settle down last evening resulting in presentation to the emergency room. Since being in the emergency room he has had antibiotics, nebs and steroids and is clearly back to his baseline according to my exam and records as well as his history. In week but is able to speak without any difficulty or dyspnea. He is wearing his oxygen. He is significantly dyspneic with exertion but this is baseline. He is a mild chronic cough which is unchanged. No chest pain. No progressive swelling. No bloody sputum. No fever or chills. By mouth intake is been fair and he did have a bowel movement as of yesterday.   Past Medical History: Past Medical History  Diagnosis Date  . Pneumonia 12/06/10    healthcare-associated/ left lower lobe  . COPD  (chronic obstructive pulmonary disease)     severe stage IV  . Atrial fibrillation     on Coumadin with [employer] Jude single-chamber pacemaker  . Diabetes mellitus     type 2; s/p Prednisone therapy for pneumonia 12/2010  . Coronary artery disease     s/p anterior STEMI 09/2009; cath. revealed mid LAD 40%, distal LAD 100%- PTCA, prox. RCA 40%, mid. RCA 100% with good collateral filling of PDA; EF 25%; NSTEMI 07/2011 - PCI/DES 100% LCX  . ST elevation (STEMI) myocardial infarction 01//11/12    anterior  . CHF (congestive heart failure)     EF 25% on 09/26/10; EF 50-55% and grade 1 diastolic dysfunction on ECHO 08/2010   . Hypertension   . Hyperlipidemia   . AAA (abdominal aortic aneurysm)     3 cm infrarenal abdominal aortic aneurysm per aorta ultrasound 03/2010  . Osteoarthritis     s/p bilat hip arthroplasty  . Angina   . Ischemic cardiomyopathy     EF 35% LHC 11/12  . GERD (gastroesophageal reflux disease)   . Hypercholesteremia   . PVD (peripheral vascular disease)   . Anxiety    Past Surgical History  Procedure Date  . Hip arthroplasty     s/p right 09/27/10 and s/p left 09/24/10  . Back surgery   . Knee surgery   . Pacemaker insertion 12/2010    St. Jude single-chamber   . Cardiac catheterization 09/2009, 06/2007    PTCA to distal LAD    Medications: Prior to Admission medications   Medication Sig Start Date End Date Taking?  Authorizing Provider  amiodarone (PACERONE) 200 MG tablet Take 1 tablet (200 mg total) by mouth daily. 04/14/12 04/14/13 Yes W Buren Kos, MD  bisoprolol (ZEBETA) 10 MG tablet Take 1 tablet (10 mg total) by mouth daily. 03/19/12 03/19/13 Yes Brooke O Edmisten, PA-C  budesonide (PULMICORT) 0.5 MG/2ML nebulizer solution Take 2 mLs (0.5 mg total) by nebulization 2 (two) times daily. 04/14/12 04/14/13 Yes W Buren Kos, MD  cholecalciferol (VITAMIN D) 1000 UNITS tablet Take 2,000 Units by mouth daily.   Yes Historical Provider, MD  clopidogrel (PLAVIX) 75 MG  tablet Take 1 tablet (75 mg total) by mouth daily with breakfast. 08/15/11 08/14/12 Yes Ok Anis, NP  digoxin (LANOXIN) 0.125 MG tablet Take 1 tablet (0.125 mg total) by mouth daily. 04/14/12 04/14/13 Yes W Buren Kos, MD  diltiazem (CARDIZEM CD) 360 MG 24 hr capsule Take 1 capsule (360 mg total) by mouth daily. 03/19/12 03/19/13 Yes Brooke O Edmisten, PA-C  feeding supplement (GLUCERNA SHAKE) LIQD Take 237 mLs by mouth 2 (two) times daily between meals. 04/14/12  Yes Kari Baars, MD  insulin aspart (NOVOLOG) 100 UNIT/ML injection Inject 6 Units into the skin 3 (three) times daily with meals. 04/14/12 04/14/13 Yes W Buren Kos, MD  insulin aspart (NOVOLOG) 100 UNIT/ML injection Inject 2-6 Units into the skin 3 (three) times daily before meals. Sliding scale 2-6units   Yes Historical Provider, MD  insulin glargine (LANTUS) 100 UNIT/ML injection Inject 22 Units into the skin daily. 04/14/12 04/14/13 Yes W Buren Kos, MD  ipratropium (ATROVENT) 0.02 % nebulizer solution Take 2.5 mLs (0.5 mg total) by nebulization every 6 (six) hours. 04/14/12 04/14/13 Yes W Buren Kos, MD  isosorbide mononitrate (IMDUR) 60 MG 24 hr tablet Take 60 mg by mouth daily.     Yes Historical Provider, MD  levalbuterol (XOPENEX) 0.63 MG/3ML nebulizer solution Take 3 mLs (0.63 mg total) by nebulization every 6 (six) hours. 04/14/12 04/14/13 Yes W Buren Kos, MD  pantoprazole (PROTONIX) 40 MG tablet Take 1 tablet (40 mg total) by mouth daily at 12 noon. 03/19/12 03/19/13 Yes Brooke O Edmisten, PA-C  predniSONE (DELTASONE) 20 MG tablet Take 0.5 tablets (10 mg total) by mouth daily. Take 2 pills daily X 3 days, then 1 pill daily X 3 days, then 1/2 pill daily indefinately 04/14/12  Yes W Buren Kos, MD  rosuvastatin (CRESTOR) 20 MG tablet Take 20 mg by mouth daily.   Yes Historical Provider, MD  sennosides-docusate sodium (SENOKOT-S) 8.6-50 MG tablet Take 1 tablet by mouth at bedtime.   Yes Historical Provider, MD  Tamsulosin HCl  (FLOMAX) 0.4 MG CAPS Take 0.4 mg by mouth daily.   Yes Historical Provider, MD  warfarin (COUMADIN) 2.5 MG tablet Take 1 tablet (2.5 mg total) by mouth one time only at 6 PM. 03/19/12 03/19/13 Yes Brooke O Edmisten, PA-C  zolpidem (AMBIEN) 5 MG tablet Take 1 tablet (5 mg total) by mouth at bedtime as needed for sleep (insomnia). 04/14/12 04/14/13 Yes W Buren Kos, MD  acetaminophen (TYLENOL) 325 MG tablet Take 2 tablets (650 mg total) by mouth every 6 (six) hours as needed (or Fever >/= 101). 04/14/12 04/14/13  Kari Baars, MD  morphine (ROXANOL) 20 MG/ML concentrated solution Take 0.13 mLs (2.6 mg total) by mouth every 6 (six) hours as needed. 04/14/12   Kari Baars, MD  moxifloxacin (AVELOX) 400 MG tablet Take 1 tablet (400 mg total) by mouth daily. 04/20/12 04/30/12  Vida Roller, MD  nitroGLYCERIN (NITROSTAT)  0.4 MG SL tablet Place 0.4 mg under the tongue every 5 (five) minutes as needed. For chest pain    Historical Provider, MD  predniSONE (DELTASONE) 20 MG tablet Take 2 tablets (40 mg total) by mouth daily. 04/20/12 04/30/12  Vida Roller, MD    Allergies:  No Known Allergies  Social History:  reports that he quit smoking about 2 years ago. His smoking use included Cigarettes. He has a 100 pack-year smoking history. He has never used smokeless tobacco. He reports that he does not drink alcohol or use illicit drugs.  Family History: Family History  Problem Relation Age of Onset  . Heart attack Mother 72  . Heart attack Father 71  . Cancer Other     siblings  . Heart attack Other     Physical Exam: Filed Vitals:   04/20/12 0026 04/20/12 0155 04/20/12 0530 04/20/12 0625  BP: 134/57  103/75   Pulse: 59  92   Temp: 98.3 F (36.8 C)     TempSrc: Oral     Resp: 18  21   Height: 5\' 9"  (1.753 m)     Weight: 69.854 kg (154 lb)     SpO2: 96% 96% 98% 97%   General appearance: alert, cooperative, appears older than stated age and no distress, oxygen Head: Normocephalic, without obvious  abnormality, atraumatic Eyes: conjunctivae/corneas clear. PERRL, EOM's intact.  Nose: Nares normal. Septum midline. Mucosa normal. No drainage or sinus tenderness. Throat: lips, mucosa, and tongue normal; teeth and gums normal Neck: no adenopathy, no carotid bruit, no JVD and thyroid not enlarged, symmetric, no tenderness/mass/nodules Resp: clear to auscultation bilaterally, is at the left base no rales or rhonchi, prolonged expiratory phase, rate is only 20 Cardio: irregularly irregular rhythm, left chest  GI: soft, non-tender; bowel sounds normal; no masses,  no organomegaly Extremities: extremities normal, atraumatic, no cyanosis or edema Pulses: 2+ and symmetric Lymph nodes: Cervical adenopathy: no cervical lymphadenopathy Neurologic: Alert and oriented X 3, normal strength and tone. Normal symmetric reflexes.     Labs on Admission:   [facility] 04/20/12 0140  NA 134*  K 4.5  CL 99  CO2 --  GLUCOSE 134*  BUN 18  CREATININE 0.70  CALCIUM --  MG --  PHOS --   No results found for this basename: AST:2,ALT:2,ALKPHOS:2,BILITOT:2,PROT:2,ALBUMIN:2 in the last 72 hours No results found for this basename: LIPASE:2,AMYLASE:2 in the last 72 hours  Basename 04/20/12 0140 04/20/12 0134  WBC -- 8.7  NEUTROABS -- 7.5  HGB 11.6* 11.7*  HCT 34.0* 34.9*  MCV -- 90.6  PLT -- 144*   No results found for this basename: CKTOTAL:3,CKMB:3,CKMBINDEX:3,TROPONINI:3 in the last 72 hours No results found for this basename: TSH,T4TOTAL,FREET3,T3FREE,THYROIDAB in the last 72 hours No results found for this basename: VITAMINB12:2,FOLATE:2,FERRITIN:2,TIBC:2,IRON:2,RETICCTPCT:2 in the last 72 hours  Radiological Exams on Admission: Dg Chest Port 1 View  04/20/2012  *RADIOLOGY REPORT*  Clinical Data: Shortness of breath, COPD, coronary disease, pacer  PORTABLE CHEST - 1 VIEW  Comparison: 04/04/2012  Findings: Stable mild cardiac enlargement without CHF or definite pneumonia.  Chronic blunting of the  right costophrenic angle may represent scarring versus small effusion.  Hyperinflation noted compatible with background COPD/emphysema.  Left subclavian single lead pacer noted.  Atherosclerosis of the aorta.  Overall, no significant change.  IMPRESSION: Stable portable chest exam.  Original Report Authenticated By: Judie Petit. Ruel Favors, M.D.   Dg Chest Portable 1 View  04/04/2012  *RADIOLOGY REPORT*  Clinical Data: Shortness of breath.  Respiratory distress.  PORTABLE CHEST - 1 VIEW  Comparison: Chest x-ray 03/10/2012.  Findings: Blunting of the right costophrenic sulcus suggesting a small right-sided pleural effusion.  No definite consolidative airspace disease.  Retrocardiac opacity favored to represent subsegmental atelectasis.  Pulmonary vasculature and the cardiomediastinal silhouette are within normal limits. Atherosclerosis in the thoracic aorta.  Left-sided pacemaker device in place with lead tip projecting over the expected location of the right ventricular apex.  IMPRESSION: 1.  Small right-sided pleural effusion. 2.  Subsegmental atelectasis in the left lower lobe. 3.  Atherosclerosis.  Original Report Authenticated By: Florencia Reasons, M.D.   Orders placed during the hospital encounter of 04/20/12  . EKG 12-LEAD  . EKG 12-LEAD    Assessment/Plan #1 severe stage IV end-stage COPD oxygen and steroid dependent with poor prognosis clearly at baseline no distress at this time  #2 coronary artery disease with myocardial infarction 2012 and 2011, multiple percutaneous interventions and no systolic and diastolic congestive heart failure  #3 essential hypertension stable  #4 chronic atrial fibrillation with pacemaker rate controlled no evidence of decompensation  #5 diabetes mellitus type 2 insulin-dependent complicated by steroids   Plan patient is to return to skilled nursing as he truly at his baseline and these exacerbations are likely to continue as his disease is end-stage. He is a DO  NOT RESUSCITATE DO NOT INTUBATE and should be treated with can tenured nebulizes, steroids. I would suggest a trial of one week course of Avelox 400 mg daily. Overall exacerbations are to be anticipated in hospitalization be of no benefit. Focus should be on comfort in treating some of the air hunger and anxiety with some Ativan and morphine as needed. At this point his heart failure is well compensated and he is no obvious pneumonia or evidence for decompensation. This is a normal waxing and waning of his natural disease pattern. He is clearly back to baseline upon transfer back to skilled nursing. I did discuss this at length with him.   Aasia Peavler A 04/20/2012, 7:22 AM

## 2012-04-20 NOTE — ED Notes (Signed)
EKG printed and given to EDP Hyacinth Meeker for review. Last confirmed EKG printed for comparison.

## 2012-04-20 NOTE — ED Notes (Signed)
Per EMS, the pt was having increased SOB throughout the day. Pt resides at [facility] ad has a extensive cardiac and respiratory hx. Pt A&Ox4. On 2L [place] at home. Lungs are diminished/rhonchi. Reports prod cough with fever. Pt has 18g in LAC placed enroute.

## 2012-04-20 NOTE — ED Notes (Signed)
Pt states that he became increasing SOB throughout the day. P says he was released from [facility] on Tuesday for the same problem. Pt says he is a complete DNR and has a signed DNR form with him.

## 2012-05-04 ENCOUNTER — Emergency Department (HOSPITAL_COMMUNITY)
Admission: EM | Admit: 2012-05-04 | Discharge: 2012-05-04 | Disposition: A | Discharge status: Skilled Nursing Facility | Payer: Medicare Other | Attending: Emergency Medicine | Admitting: Emergency Medicine

## 2012-05-04 ENCOUNTER — Encounter (HOSPITAL_COMMUNITY): Payer: Self-pay | Admitting: Emergency Medicine

## 2012-05-04 DIAGNOSIS — R339 Retention of urine, unspecified: Secondary | ICD-10-CM | POA: Insufficient documentation

## 2012-05-04 DIAGNOSIS — N390 Urinary tract infection, site not specified: Secondary | ICD-10-CM | POA: Insufficient documentation

## 2012-05-04 DIAGNOSIS — E119 Type 2 diabetes mellitus without complications: Secondary | ICD-10-CM | POA: Insufficient documentation

## 2012-05-04 DIAGNOSIS — I10 Essential (primary) hypertension: Secondary | ICD-10-CM | POA: Insufficient documentation

## 2012-05-04 DIAGNOSIS — R109 Unspecified abdominal pain: Secondary | ICD-10-CM | POA: Insufficient documentation

## 2012-05-04 DIAGNOSIS — R10819 Abdominal tenderness, unspecified site: Secondary | ICD-10-CM | POA: Insufficient documentation

## 2012-05-04 DIAGNOSIS — Z950 Presence of cardiac pacemaker: Secondary | ICD-10-CM | POA: Insufficient documentation

## 2012-05-04 DIAGNOSIS — R062 Wheezing: Secondary | ICD-10-CM | POA: Insufficient documentation

## 2012-05-04 DIAGNOSIS — Z7901 Long term (current) use of anticoagulants: Secondary | ICD-10-CM | POA: Insufficient documentation

## 2012-05-04 LAB — CBC WITH DIFFERENTIAL/PLATELET
Eosinophils Absolute: 0.1 10*3/uL (ref 0.0–0.7)
Hemoglobin: 11.1 g/dL — ABNORMAL LOW (ref 13.0–17.0)
Lymphs Abs: 0.6 10*3/uL — ABNORMAL LOW (ref 0.7–4.0)
Monocytes Relative: 8 % (ref 3–12)
Neutro Abs: 3.5 10*3/uL (ref 1.7–7.7)
Neutrophils Relative %: 79 % — ABNORMAL HIGH (ref 43–77)
Platelets: 219 10*3/uL (ref 150–400)
RBC: 3.76 MIL/uL — ABNORMAL LOW (ref 4.22–5.81)
WBC: 4.5 10*3/uL (ref 4.0–10.5)

## 2012-05-04 LAB — BASIC METABOLIC PANEL
BUN: 14 mg/dL (ref 6–23)
Chloride: 93 mEq/L — ABNORMAL LOW (ref 96–112)
GFR calc Af Amer: >90 mL/min (ref >90)
GFR calc non Af Amer: 89 mL/min — ABNORMAL LOW (ref >90)
Glucose, Bld: 94 mg/dL (ref 70–99)
Potassium: 3.8 mEq/L (ref 3.5–5.1)
Sodium: 131 mEq/L — ABNORMAL LOW (ref 135–145)

## 2012-05-04 LAB — URINE MICROSCOPIC-ADD ON

## 2012-05-04 LAB — URINALYSIS, ROUTINE W REFLEX MICROSCOPIC
Bilirubin Urine: NEGATIVE
Hgb urine dipstick: NEGATIVE
Nitrite: NEGATIVE
Specific Gravity, Urine: 1.014 (ref 1.005–1.030)
Urobilinogen, UA: 1 mg/dL (ref 0.0–1.0)
pH: 6 (ref 5.0–8.0)

## 2012-05-04 MED ORDER — CIPROFLOXACIN HCL 500 MG PO TABS: 500.0000 mg | ORAL_TABLET | Freq: Two times a day (BID) | ORAL | Status: DC

## 2012-05-04 MED ORDER — LIDOCAINE HCL 2 % EX GEL
CUTANEOUS | Status: AC
  Administered 2012-05-04: 1
  Filled 2012-05-04: qty 10

## 2012-05-04 MED ORDER — CIPROFLOXACIN HCL 500 MG PO TABS: 500.0000 mg | ORAL_TABLET | Freq: Two times a day (BID) | ORAL | Status: AC

## 2012-05-04 MED ORDER — CIPROFLOXACIN HCL 500 MG PO TABS
500.0000 mg | ORAL_TABLET | Freq: Once | ORAL | Status: AC
Start: 2012-05-04 — End: 2012-05-04
  Administered 2012-05-04: 500 mg via ORAL
  Filled 2012-05-04: qty 1

## 2012-05-04 MED ORDER — CIPROFLOXACIN HCL 500 MG PO TABS
500.0000 mg | ORAL_TABLET | Freq: Once | ORAL | Status: AC
  Administered 2012-05-04: 500 mg via ORAL
  Filled 2012-05-04: qty 1

## 2012-05-04 NOTE — ED Notes (Signed)
Pt has drained of urine at this point

## 2012-05-04 NOTE — ED Notes (Signed)
ZOX:[id number]<AV> Expected date:05/04/12<BR> Expected time: 8:31 AM<BR> Means of arrival:Ambulance<BR> Comments:<BR> 84yoM, urinary retention, lower abd pain

## 2012-05-04 NOTE — ED Notes (Signed)
Pt from [facility].  Pt states he began having abd pain yesterday afternoon in the RLQ.  Pt also states that he has been having difficulty urinating.  Last time he urinated was 0500 this am.  Pt states he has been constipated.

## 2012-05-04 NOTE — ED Notes (Signed)
Bladder scan over 970.

## 2012-05-04 NOTE — ED Provider Notes (Signed)
History     CSN: [id number]  Arrival date & time 05/04/12  6213   First MD Initiated Contact with Patient 05/04/12 [id number]      Chief Complaint  Patient presents with  . Abdominal Pain  . Urinary Retention    (Consider location/radiation/quality/duration/timing/severity/associated sxs/prior treatment) HPI Comments: [name] 78 y.o. male   The chief complaint is: Patient presents with:   Abdominal Pain   Urinary Retention   The patient has medical history significant for:   Past Medical History:   Pneumonia                                       12/06/10       Comment:healthcare-associated/ left lower lobe   COPD (chronic obstructive pulmonary disease)                   Comment:severe stage IV   Atrial fibrillation                                            Comment:on Coumadin with St. Jude single-chamber               pacemaker   Diabetes mellitus                                              Comment:type 2; s/p Prednisone therapy for pneumonia               12/2010   Coronary artery disease                                        Comment:s/p anterior STEMI 09/2009; cath. revealed mid               LAD 40%, distal LAD 100%- PTCA, prox. RCA 40%,               mid. RCA 100% with good collateral filling of               PDA; EF 25%; NSTEMI 07/2011 - PCI/DES 100% LCX   ST elevation (STEMI) myocardial infarction      01//11/12      Comment:anterior   CHF (congestive heart failure)                                 Comment:EF 25% on 09/26/10; EF 50-55% and grade 1               diastolic dysfunction on ECHO 08/2010    Hypertension                                                 Hyperlipidemia  AAA (abdominal aortic aneurysm)                                Comment:3 cm infrarenal abdominal aortic aneurysm per               aorta ultrasound 03/2010   Osteoarthritis                                                 Comment:s/p  bilat hip arthroplasty   Angina                                                       Ischemic cardiomyopathy                                        Comment:EF 35% LHC 11/12   GERD (gastroesophageal reflux disease)                       Hypercholesteremia                                           PVD (peripheral vascular disease)                            Anxiety                                                     Patient presented from [facility] because this morning at 5:00am he had RLQ pain rated 10/10 and difficulty urinating. Denies fever or chills. Denies NVD, or abdominal pain. Denies frequency, urgency, hematuria, but reports some intermittent dysuria.      Patient is a 78 y.o. male presenting with abdominal pain. The history is provided by the patient.  Abdominal Pain The primary symptoms of the illness include abdominal pain and dysuria. The primary symptoms of the illness do not include fever, nausea, vomiting or diarrhea.  The dysuria is not associated with hematuria, frequency or urgency.   Additional symptoms associated with the illness include constipation. Symptoms associated with the illness do not include chills, urgency, hematuria or frequency.    Past Medical History  Diagnosis Date  . Pneumonia 12/06/10    healthcare-associated/ left lower lobe  . COPD (chronic obstructive pulmonary disease)     severe stage IV  . Atrial fibrillation     on Coumadin with St. Jude single-chamber pacemaker  . Diabetes mellitus     type 2; s/p Prednisone therapy for pneumonia 12/2010  . Coronary artery disease     s/p anterior STEMI 09/2009; cath. revealed mid LAD 40%, distal LAD 100%- PTCA, prox. RCA 40%, mid. RCA 100% with good collateral filling of PDA; EF 25%; NSTEMI 07/2011 - PCI/DES 100% LCX  .  ST elevation (STEMI) myocardial infarction 01//11/12    anterior  . CHF (congestive heart failure)     EF 25% on 09/26/10; EF 50-55% and grade 1 diastolic dysfunction on ECHO  16/1096   . Hypertension   . Hyperlipidemia   . AAA (abdominal aortic aneurysm)     3 cm infrarenal abdominal aortic aneurysm per aorta ultrasound 03/2010  . Osteoarthritis     s/p bilat hip arthroplasty  . Angina   . Ischemic cardiomyopathy     EF 35% LHC 11/12  . GERD (gastroesophageal reflux disease)   . Hypercholesteremia   . PVD (peripheral vascular disease)   . Anxiety     Past Surgical History  Procedure Date  . Hip arthroplasty     s/p right 09/27/10 and s/p left 09/24/10  . Back surgery   . Knee surgery   . Pacemaker insertion 12/2010    St. Jude single-chamber   . Cardiac catheterization 09/2009, 06/2007    PTCA to distal LAD    Family History  Problem Relation Age of Onset  . Heart attack Mother 63  . Heart attack Father 67  . Cancer Other     siblings  . Heart attack Other     History  Substance Use Topics  . Smoking status: Former Smoker -- 2.0 packs/day for 50 years    Types: Cigarettes    Quit date: 09/02/2009  . Smokeless tobacco: Never Used   Comment: smoked for 97yrs quit for 9yrs started back & smoked for 10ys  . Alcohol Use: No     occ - alcohol      Review of Systems  Constitutional: Negative for fever and chills.  Gastrointestinal: Positive for abdominal pain, constipation and abdominal distention. Negative for nausea, vomiting and diarrhea.  Genitourinary: Positive for dysuria, decreased urine volume and difficulty urinating. Negative for urgency, frequency, hematuria and flank pain.  All other systems reviewed and are negative.    Allergies  Review of patient's allergies indicates no known allergies.  Home Medications   Current Outpatient Rx  Name Route Sig Dispense Refill  . ACETAMINOPHEN 325 MG PO TABS Oral Take 2 tablets (650 mg total) by mouth every 6 (six) hours as needed (or Fever >/= 101).    . AMIODARONE HCL 200 MG PO TABS Oral Take 1 tablet (200 mg total) by mouth daily. 30 tablet 1  . BISOPROLOL FUMARATE 10 MG PO  TABS Oral Take 1 tablet (10 mg total) by mouth daily. 30 tablet 3  . BUDESONIDE 0.5 MG/2ML IN SUSP Nebulization Take 2 mLs (0.5 mg total) by nebulization 2 (two) times daily. 120 mL 1  . VITAMIN D 1000 UNITS PO TABS Oral Take 2,000 Units by mouth daily.    Marland Kitchen CLOPIDOGREL BISULFATE 75 MG PO TABS Oral Take 1 tablet (75 mg total) by mouth daily with breakfast. 30 tablet 6  . DIGOXIN 0.125 MG PO TABS Oral Take 1 tablet (0.125 mg total) by mouth daily. 30 tablet 1  . DILTIAZEM HCL ER COATED BEADS 360 MG PO CP24 Oral Take 1 capsule (360 mg total) by mouth daily. 30 capsule 3  . GLUCERNA SHAKE PO LIQD Oral Take 237 mLs by mouth 2 (two) times daily between meals.    . INSULIN ASPART 100 UNIT/ML [place] SOLN Subcutaneous Inject 6 Units into the skin 3 (three) times daily with meals. 1 vial 3  . INSULIN ASPART 100 UNIT/ML [place] SOLN Subcutaneous Inject 2-6 Units into the skin 3 (three) times daily  before meals. Sliding scale 2-6units    . INSULIN GLARGINE 100 UNIT/ML [place] SOLN Subcutaneous Inject 22 Units into the skin daily. 10 mL 3  . IPRATROPIUM BROMIDE 0.02 % IN SOLN Nebulization Take 2.5 mLs (0.5 mg total) by nebulization every 6 (six) hours. 75 mL 1  . ISOSORBIDE MONONITRATE ER 60 MG PO TB24 Oral Take 60 mg by mouth daily.      Marland Kitchen LEVALBUTEROL HCL 0.63 MG/3ML IN NEBU Nebulization Take 3 mLs (0.63 mg total) by nebulization every 6 (six) hours. 3 mL 1  . MORPHINE SULFATE (CONCENTRATE) 20 MG/ML PO SOLN Oral Take 0.13 mLs (2.6 mg total) by mouth every 6 (six) hours as needed. 30 mL 0  . NITROGLYCERIN 0.4 MG SL SUBL Sublingual Place 0.4 mg under the tongue every 5 (five) minutes as needed. For chest pain    . PANTOPRAZOLE SODIUM 40 MG PO TBEC Oral Take 1 tablet (40 mg total) by mouth daily at 12 noon. 30 tablet 3  . PREDNISONE 20 MG PO TABS Oral Take 0.5 tablets (10 mg total) by mouth daily. Take 2 pills daily X 3 days, then 1 pill daily X 3 days, then 1/2 pill daily indefinately 30 tablet 3  . ROSUVASTATIN CALCIUM 20  MG PO TABS Oral Take 20 mg by mouth daily.    . SENNA-DOCUSATE SODIUM 8.6-50 MG PO TABS Oral Take 1 tablet by mouth at bedtime.    . TAMSULOSIN HCL 0.4 MG PO CAPS Oral Take 0.4 mg by mouth daily.    . WARFARIN SODIUM 2.5 MG PO TABS Oral Take 1 tablet (2.5 mg total) by mouth one time only at 6 PM. 30 tablet 3  . ZOLPIDEM TARTRATE 5 MG PO TABS Oral Take 1 tablet (5 mg total) by mouth at bedtime as needed for sleep (insomnia). 30 tablet 1    BP 130/90  Pulse 95  Temp 97.5 F (36.4 C)  Resp 20  SpO2 100%  Physical Exam  Nursing note and vitals reviewed. Constitutional: He appears well-developed. No distress.  HENT:  Head: Normocephalic and atraumatic.  Mouth/Throat: Oropharynx is clear and moist.  Eyes: Conjunctivae and EOM are normal. No scleral icterus.  Neck: Normal range of motion. Neck supple.  Cardiovascular: Normal rate, regular rhythm and normal heart sounds.   Pulmonary/Chest: Effort normal. He has wheezes.  Abdominal: Soft. Bowel sounds are normal. He exhibits distension. There is tenderness in the right lower quadrant, suprapubic area and left lower quadrant. There is no CVA tenderness.  Genitourinary: Penis normal.  Neurological: He is alert.  Skin: Skin is warm and dry.   Results for orders placed during the hospital encounter of 05/04/12  CBC WITH DIFFERENTIAL      Component Value Range   WBC 4.5  4.0 - 10.5 K/uL   RBC 3.76 (*) 4.22 - 5.81 MIL/uL   Hemoglobin 11.1 (*) 13.0 - 17.0 g/dL   HCT 96.0 (*) 45.4 - 09.8 %   MCV 91.8  78.0 - 100.0 fL   MCH 29.5  26.0 - 34.0 pg   MCHC 32.2  30.0 - 36.0 g/dL   RDW 11.9  14.7 - 82.9 %   Platelets 219  150 - 400 K/uL   Neutrophils Relative 79 (*) 43 - 77 %   Neutro Abs 3.5  1.7 - 7.7 K/uL   Lymphocytes Relative 13  12 - 46 %   Lymphs Abs 0.6 (*) 0.7 - 4.0 K/uL   Monocytes Relative 8  3 - 12 %  Monocytes Absolute 0.3  0.1 - 1.0 K/uL   Eosinophils Relative 1  0 - 5 %   Eosinophils Absolute 0.1  0.0 - 0.7 K/uL   Basophils  Relative 0  0 - 1 %   Basophils Absolute 0.0  0.0 - 0.1 K/uL  BASIC METABOLIC PANEL      Component Value Range   Sodium 131 (*) 135 - 145 mEq/L   Potassium 3.8  3.5 - 5.1 mEq/L   Chloride 93 (*) 96 - 112 mEq/L   CO2 35 (*) 19 - 32 mEq/L   Glucose, Bld 94  70 - 99 mg/dL   BUN 14  6 - 23 mg/dL   Creatinine, Ser 2.13  0.50 - 1.35 mg/dL   Calcium 8.6  8.4 - 08.6 mg/dL   GFR calc non Af Amer 89 (*) >90 mL/min   GFR calc Af Amer >90  >90 mL/min  URINALYSIS, ROUTINE W REFLEX MICROSCOPIC      Component Value Range   Color, Urine YELLOW  YELLOW   APPearance CLOUDY (*) CLEAR   Specific Gravity, Urine 1.014  1.005 - 1.030   pH 6.0  5.0 - 8.0   Glucose, UA NEGATIVE  NEGATIVE mg/dL   Hgb urine dipstick NEGATIVE  NEGATIVE   Bilirubin Urine NEGATIVE  NEGATIVE   Ketones, ur NEGATIVE  NEGATIVE mg/dL   Protein, ur NEGATIVE  NEGATIVE mg/dL   Urobilinogen, UA 1.0  0.0 - 1.0 mg/dL   Nitrite NEGATIVE  NEGATIVE   Leukocytes, UA MODERATE (*) NEGATIVE  URINE MICROSCOPIC-ADD ON      Component Value Range   Squamous Epithelial / LPF RARE  RARE   WBC, UA 3-6  <3 WBC/hpf   Bacteria, UA MANY (*) RARE   Casts HYALINE CASTS (*) NEGATIVE     ED Course  Procedures (including critical care time)   Labs Reviewed  CBC WITH DIFFERENTIAL  BASIC METABOLIC PANEL  URINALYSIS, ROUTINE W REFLEX MICROSCOPIC   No results found.   1. UTI (lower urinary tract infection)       MDM  Patient presented with abdominal pain and urinary retention. Bladder scan showed patient to almost 1L of retained fluid. Foley catheter placed and urine drained successfully. Patient states that his pain is subsiding. CBC& CMP: unremarkable. UA: moderate leukocytes. Patient will be given first dose of Cipro here and discharged on 6 more days. Patient plant to be discharged with leg bag and referred to urology. Patient care signed off to Arthor Captain, PA-C.       Pixie Casino, PA-C 05/04/12 1028

## 2012-05-04 NOTE — ED Notes (Signed)
PT states he has had foleys at home before, and verbalized understanding of foley care.  Pt states he does not want to use leg bag.  Will send leg bag with pt back to nursing home.

## 2012-05-07 ENCOUNTER — Ambulatory Visit (INDEPENDENT_AMBULATORY_CARE_PROVIDER_SITE_OTHER): Payer: Medicare Other | Admitting: Internal Medicine

## 2012-05-07 ENCOUNTER — Ambulatory Visit: Payer: Self-pay | Admitting: Pharmacist

## 2012-05-07 ENCOUNTER — Encounter: Payer: Self-pay | Admitting: Internal Medicine

## 2012-05-07 VITALS — BP 118/72 | HR 67 | Temp 97.6°F | Ht 69.0 in | Wt 167.8 lb

## 2012-05-07 DIAGNOSIS — J439 Emphysema, unspecified: Secondary | ICD-10-CM

## 2012-05-07 DIAGNOSIS — J438 Other emphysema: Secondary | ICD-10-CM

## 2012-05-07 NOTE — Progress Notes (Signed)
Subjective:    Patient ID: [name], male    DOB: 25-May-1928, 78 y.o.   MRN: [id number]  HPI Problem list 1. 75 pack smoking hx. Quit 2010. Known CAD  2. COPD - Gold stage 3-4  On atrovent, pulmicort nebs - PFTs 11/21/2010  - spirometry only: Fev1 0.84L/31%, Ratio 45. Gold stage 3-4 COPD Spiro 04/26/2011 - fev1 1.06L/34% CAT score 17 - 02/04/2012 Did not desaturate May 2012 185 feet x 3 laps.   3. AECOPD hx:   -March 2012 (LLL PNA),   - April 2012 - clear cxr - Acute Visit 03/05/12 - OPD Rx - June 2013 - a fib and aECopd Admit - July 2013  - for HCAP  OV 05/07/2012  Followup severe copd. Since las visit admitted twice. First 03/10/12 to 03/19/12 for A Fib and AECOPD. 2nd one is:  04/04/12 to 04/14/12 for Acuate on chronic resp failure due to HCAP (not intubated).  Now also with foley for several days. With atll this he is more deconditiponed. He is at [facility] rehab. ECOG is 3-4. Needs helps with ADL including shower. Spends most days in bed/chair. Periodically dejected about step ladder down pattern of life. He knows chronic illnesses catching up and is predictive of mortality. CAT scre is 24 which is a decline from 38 in May 2013 and reflect steady decline.  Reports dyspne aand cough stable. No fever. Does not want pulmicort nebs anymore; paradoxically worsens dyspnea. He is ok with trying qvar mdi. Wants xopenex mdi sample.   CAT COPD Symptom & Quality of Life Score (GSK trademark) 0 is no burden. 5 is highest burden 05/07/2012   Never Cough -> Cough all the time 2  No phlegm in chest -> Chest is full of phlegm 2  No chest tightness -> Chest feels very tight 1  No dyspnea for 1 flight stairs/hill -> Very dyspneic for 1 flight of stairs 5  No limitations for ADL at home -> Very limited with ADL at home 5  Confident leaving home -> Not at all confident leaving home 4  Sleep soundly -> Do not sleep soundly because of lung condition 1  Lots of Energy -> No energy at all 4    TOTAL Score (max 40)  24   Past, Family, Social reviewed: as in HPI  Review of Systems  Constitutional: Negative for fever and unexpected weight change.  HENT: Negative for ear pain, nosebleeds, congestion, sore throat, rhinorrhea, sneezing, trouble swallowing, dental problem, postnasal drip and sinus pressure.   Eyes: Negative for redness and itching.  Respiratory: Positive for shortness of breath. Negative for cough, chest tightness and wheezing.   Cardiovascular: Negative for palpitations and leg swelling.  Gastrointestinal: Negative for nausea and vomiting.  Genitourinary: Negative for dysuria.  Musculoskeletal: Negative for joint swelling.  Skin: Negative for rash.  Neurological: Negative for headaches.  Hematological: Does not bruise/bleed easily.  Psychiatric/Behavioral: Negative for dysphoric mood. The patient is not nervous/anxious.        Objective:   Physical Exam  Nursing note and vitals reviewed. Constitutional: He is oriented to person, place, and time. He appears well-developed and well-nourished. No distress.       Deconditioned Body mass index is 24.78 kg/(m^2). Sitting in wheel chair  HENT:  Head: Normocephalic and atraumatic.  Right Ear: External ear normal.  Left Ear: External ear normal.  Mouth/Throat: Oropharynx is clear and moist. No oropharyngeal exudate.  Eyes: Conjunctivae and EOM are normal. Pupils are equal,  round, and reactive to light. Right eye exhibits no discharge. Left eye exhibits no discharge. No scleral icterus.  Neck: Normal range of motion. Neck supple. No JVD present. No tracheal deviation present. No thyromegaly present.  Cardiovascular: Normal rate, regular rhythm and intact distal pulses.  Exam reveals no gallop and no friction rub.   No murmur heard. Pulmonary/Chest: Effort normal and breath sounds normal. No respiratory distress. He has no wheezes. He has no rales. He exhibits no tenderness.  Abdominal: Soft. Bowel sounds are normal.  He exhibits no distension and no mass. There is no tenderness. There is no rebound and no guarding.  Genitourinary:       Has foley  Musculoskeletal: Normal range of motion. He exhibits no edema and no tenderness.  Lymphadenopathy:    He has no cervical adenopathy.  Neurological: He is alert and oriented to person, place, and time. He has normal reflexes. No cranial nerve deficit. Coordination normal.  Skin: Skin is warm and dry. No rash noted. He is not diaphoretic. No erythema. No pallor.  Psychiatric: He has a normal mood and affect. His behavior is normal. Judgment and thought content normal.          Assessment & Plan:

## 2012-05-07 NOTE — ED Provider Notes (Signed)
Medical screening examination/treatment/procedure(s) were conducted as a shared visit with non-physician practitioner(s) and myself.  I personally evaluated the patient during the encounter   Rolan Bucco, MD 05/07/12 1930

## 2012-05-07 NOTE — Patient Instructions (Addendum)
#  COPD  stable today  as discussed your chronic health issues are slowly gaining ground on you Keep pushing at rehab Continue your copd medicatiions but instead of pulmicort nebulizer take qvar 2 puff twice daily - take sample and prescription and show technique Take sample xopenex mdi  Have flu shot in fall Return in 3 months or sooner if needed

## 2012-05-10 ENCOUNTER — Encounter: Payer: Self-pay | Admitting: Internal Medicine

## 2012-05-10 NOTE — Assessment & Plan Note (Addendum)
He is steadily declining and following a downward step laddder pattern of decline seen in copd patients. CAT score up to 24 (was 17 in May 2013) and reflects general decline. Currently not in AECOPD. We discussed this decline. He understands that he is on a downward spiral. He is coping ok with it. At his request we will change pulmicort nebs to qvar. I have encouraged him to continue to push with rehab and see if we can get a lucky break. Advised flu shot in fall and ROV in 3 months. At this point, he does not feel ready for hospice. Will review goals of care again at fu  > 50% of this > 25 min visit spent in face to face counseling (15 min visit converted to 25 min)

## 2012-05-22 ENCOUNTER — Encounter: Payer: Medicare Other | Admitting: Internal Medicine

## 2012-06-03 ENCOUNTER — Encounter: Payer: Self-pay | Admitting: *Deleted

## 2012-06-15 ENCOUNTER — Ambulatory Visit (INDEPENDENT_AMBULATORY_CARE_PROVIDER_SITE_OTHER): Payer: Medicare Other | Admitting: Internal Medicine

## 2012-06-15 ENCOUNTER — Encounter: Payer: Self-pay | Admitting: Internal Medicine

## 2012-06-15 VITALS — BP 138/70 | HR 65 | Ht 69.0 in | Wt 177.0 lb

## 2012-06-15 DIAGNOSIS — I5043 Acute on chronic combined systolic (congestive) and diastolic (congestive) heart failure: Secondary | ICD-10-CM

## 2012-06-15 DIAGNOSIS — I2510 Atherosclerotic heart disease of native coronary artery without angina pectoris: Secondary | ICD-10-CM

## 2012-06-15 DIAGNOSIS — Z950 Presence of cardiac pacemaker: Secondary | ICD-10-CM

## 2012-06-15 DIAGNOSIS — I4821 Permanent atrial fibrillation: Secondary | ICD-10-CM

## 2012-06-15 DIAGNOSIS — I4891 Unspecified atrial fibrillation: Secondary | ICD-10-CM

## 2012-06-15 LAB — PACEMAKER DEVICE OBSERVATION
BATTERY VOLTAGE: 2.993 V
BRDY-0002RV: 60 {beats}/min
RV LEAD IMPEDENCE PM: 475 Ohm
RV LEAD THRESHOLD: 0.5 V

## 2012-06-15 MED ORDER — FUROSEMIDE 20 MG PO TABS: 20.0000 mg | ORAL_TABLET | Freq: Every day | ORAL | Status: DC

## 2012-06-15 NOTE — Assessment & Plan Note (Signed)
Because of increased bleeding under his skin in his arms, and because he has now gone 11 months from most recent stent, I've asked the patient to stop Plavix.

## 2012-06-15 NOTE — Progress Notes (Signed)
HPI Mr. [name] returns today for followup. he is a very pleasant 78 year old man with an ischemic cardiomyopathy, chronic systolic heart failure, COPD, atrial fibrillation, status post pacemaker insertion. In the interim, he had a COPD flare. He is following up with our pulmonary specialist. The patient complains of dyspnea. He has had some peripheral edema. He also complains of worsening bruising in his arms. He is on a combination of warfarin and Plavix. He is approximately 11 months out from his most recent stent. No Known Allergies   Current Outpatient Prescriptions  Medication Sig Dispense Refill  . acetaminophen (TYLENOL) 325 MG tablet Take 2 tablets (650 mg total) by mouth every 6 (six) hours as needed (or Fever >/= 101).      Marland Kitchen ALPRAZolam (XANAX) 0.5 MG tablet Take 0.5 mg by mouth 3 (three) times daily as needed. For anxiety.      Marland Kitchen amiodarone (PACERONE) 200 MG tablet Take 1 tablet (200 mg total) by mouth daily.  30 tablet  1  . ARTIFICIAL TEAR OP Apply to eye.      . beclomethasone (QVAR) 80 MCG/ACT inhaler Inhale 1 puff into the lungs 2 (two) times daily.      . bisoprolol (ZEBETA) 10 MG tablet Take 1 tablet (10 mg total) by mouth daily.  30 tablet  3  . cholecalciferol (VITAMIN D) 1000 UNITS tablet Take 2,000 Units by mouth daily.      . clopidogrel (PLAVIX) 75 MG tablet Take 1 tablet (75 mg total) by mouth daily with breakfast.  30 tablet  6  . digoxin (LANOXIN) 0.125 MG tablet Take 0.125 mg by mouth daily.      Marland Kitchen diltiazem (CARDIZEM CD) 360 MG 24 hr capsule Take 1 capsule (360 mg total) by mouth daily.  30 capsule  3  . feeding supplement (GLUCERNA SHAKE) LIQD Take 237 mLs by mouth 2 (two) times daily between meals.      . finasteride (PROSCAR) 5 MG tablet Take 5 mg by mouth daily.      Marland Kitchen guaiFENesin (MUCINEX) 600 MG 12 hr tablet Take 1,200 mg by mouth 2 (two) times daily.      . insulin aspart (NOVOLOG) 100 UNIT/ML injection Inject 6 Units into the skin 3 (three) times daily with  meals.  1 vial  3  . insulin glargine (LANTUS) 100 UNIT/ML injection Inject 22 Units into the skin daily.  10 mL  3  . ipratropium-albuterol (DUONEB) 0.5-2.5 (3) MG/3ML SOLN Take 3 mLs by nebulization every 6 (six) hours.      . isosorbide mononitrate (IMDUR) 60 MG 24 hr tablet Take 60 mg by mouth daily.        . Levalbuterol HCl (XOPENEX IN) Inhale into the lungs as needed.      Marland Kitchen morphine (ROXANOL) 20 MG/ML concentrated solution Take 10 mg by mouth every 2 (two) hours as needed. For pain.      . nitroGLYCERIN (NITROSTAT) 0.4 MG SL tablet Place 0.4 mg under the tongue every 5 (five) minutes as needed. For chest pain      . oxyCODONE (OXYCONTIN) 10 MG 12 hr tablet Take 10 mg by mouth every 12 (twelve) hours.      . pantoprazole (PROTONIX) 40 MG tablet Take 40 mg by mouth daily.      . polyethylene glycol (MIRALAX / GLYCOLAX) packet Take 17 g by mouth daily.      . predniSONE (DELTASONE) 5 MG tablet Take 10 mg by mouth daily.       Marland Kitchen  rosuvastatin (CRESTOR) 20 MG tablet Take 20 mg by mouth daily.      . sennosides-docusate sodium (SENOKOT-S) 8.6-50 MG tablet Take 2 tablets by mouth at bedtime.       . Tamsulosin HCl (FLOMAX) 0.4 MG CAPS Take 0.4 mg by mouth daily.      Marland Kitchen warfarin (COUMADIN) 6 MG tablet Take 6 mg by mouth daily.      Marland Kitchen zolpidem (AMBIEN) 5 MG tablet Take 1 tablet (5 mg total) by mouth at bedtime as needed for sleep (insomnia).  30 tablet  1  . DISCONTD: insulin aspart (NOVOLOG) 100 UNIT/ML injection Inject 2-6 Units into the skin 3 (three) times daily before meals. Sliding scale 2-6units         Past Medical History  Diagnosis Date  . Pneumonia 12/06/10    healthcare-associated/ left lower lobe  . COPD (chronic obstructive pulmonary disease)     severe stage IV  . Atrial fibrillation     on Coumadin with St. Jude single-chamber pacemaker  . Diabetes mellitus     type 2; s/p Prednisone therapy for pneumonia 12/2010  . Coronary artery disease     s/p anterior STEMI 09/2009;  cath. revealed mid LAD 40%, distal LAD 100%- PTCA, prox. RCA 40%, mid. RCA 100% with good collateral filling of PDA; EF 25%; NSTEMI 07/2011 - PCI/DES 100% LCX  . ST elevation (STEMI) myocardial infarction 01//11/12    anterior  . CHF (congestive heart failure)     EF 25% on 09/26/10; EF 50-55% and grade 1 diastolic dysfunction on ECHO 08/2010   . Hypertension   . Hyperlipidemia   . AAA (abdominal aortic aneurysm)     3 cm infrarenal abdominal aortic aneurysm per aorta ultrasound 03/2010  . Osteoarthritis     s/p bilat hip arthroplasty  . Angina   . Ischemic cardiomyopathy     EF 35% LHC 11/12  . GERD (gastroesophageal reflux disease)   . Hypercholesteremia   . PVD (peripheral vascular disease)   . Anxiety     ROS:   All systems reviewed and negative except as noted in the HPI.   Past Surgical History  Procedure Date  . Hip arthroplasty     s/p right 09/27/10 and s/p left 09/24/10  . Back surgery   . Knee surgery   . Pacemaker insertion 12/2010    St. Jude single-chamber   . Cardiac catheterization 09/2009, 06/2007    PTCA to distal LAD     Family History  Problem Relation Age of Onset  . Heart attack Mother 36  . Heart attack Father 29  . Cancer Other     siblings  . Heart attack Other      History   Social History  . Marital Status: Widowed    Spouse Name: N/A    Number of Children: N/A  . Years of Education: N/A   Occupational History  . Not on file.   Social History Main Topics  . Smoking status: Former Smoker -- 2.0 packs/day for 50 years    Types: Cigarettes    Quit date: 09/02/2009  . Smokeless tobacco: Never Used   Comment: smoked for 87yrs quit for 26yrs started back & smoked for 10ys  . Alcohol Use: No     occ - alcohol  . Drug Use: No  . Sexually Active: Not Currently   Other Topics Concern  . Not on file   Social History Narrative  . No narrative on file  BP 138/70  Pulse 65  Ht 5\' 9"  (1.753 m)  Wt 177 lb (80.287 kg)  BMI  26.14 kg/m2  Physical Exam:  Chronically ill appearing NAD HEENT: Unremarkable Neck:  7 cm  JVD, no thyromegally Lungs:  Clear with no wheezes but diffuse scattered rales.  HEART:  Regular rate rhythm, no murmurs, no rubs, no clicks Abd:  soft, positive bowel sounds, no organomegally, no rebound, no guarding Ext:  2 plus pulses, no edema, no cyanosis, no clubbing Skin:  No rashes no nodules Neuro:  CN II through XII intact, motor grossly intact  DEVICE  Normal device function.  See PaceArt for details.   Assess/Plan:

## 2012-06-15 NOTE — Assessment & Plan Note (Signed)
He appears to be volume overloaded. I've asked the patient to reduce his sodium intake and we will start Lasix 40 mg a day, decreasing the dose to 20 mg a day after 4 days. We'll instruct the patient to eat more food with potassium.

## 2012-06-15 NOTE — Patient Instructions (Addendum)
Your physician has recommended you make the following change in your medication:  1.)  START LASIX  TWO TABLETS ( 20 MG ) TWICE A DAY FOR FOUR DAYS THAN DECREASE TO ONE TABLET ( 20 MG) DAILY 2.)  STOP PLAVIX ( 75 MG)   Your physician wants you to follow-up in: ONE YEAR WITH DR. Court Joy will receive a reminder letter in the mail two months in advance. If you don't receive a letter, please call our office to schedule the follow-up appointment.

## 2012-06-15 NOTE — Assessment & Plan Note (Signed)
His St. Jude dual-chamber pacemaker is working normally. We'll plan to recheck in several months. 

## 2012-07-20 ENCOUNTER — Ambulatory Visit (INDEPENDENT_AMBULATORY_CARE_PROVIDER_SITE_OTHER): Payer: Medicare Other | Admitting: Cardiology

## 2012-07-20 VITALS — BP 102/56 | HR 60 | Ht 69.0 in | Wt 170.0 lb

## 2012-07-20 DIAGNOSIS — I4891 Unspecified atrial fibrillation: Secondary | ICD-10-CM

## 2012-07-20 DIAGNOSIS — I214 Non-ST elevation (NSTEMI) myocardial infarction: Secondary | ICD-10-CM

## 2012-07-20 DIAGNOSIS — J441 Chronic obstructive pulmonary disease with (acute) exacerbation: Secondary | ICD-10-CM

## 2012-07-20 DIAGNOSIS — Z950 Presence of cardiac pacemaker: Secondary | ICD-10-CM

## 2012-07-20 NOTE — Assessment & Plan Note (Signed)
The patient has not been experiencing any chest pain or angina. 

## 2012-07-20 NOTE — Progress Notes (Signed)
[name] Date of Birth:  1927-11-05 [facility] [address] [place], [place]  [address] [phone]  Fax   [phone]  HPI: This pleasant 78 year old gentleman is seen for a scheduled 3 month followup office visit.  He has a past history of severe COPD and has had multiple hospitalizations in the past for exacerbation of COPD and exacerbation of diastolic congestive heart failure.  Following his last hospitalization he was placed with palliative care.  He went to [facility] and is now back home in his own apartment in [place].  He has a past history of acute on chronic combined systolic and diastolic heart failure and a past history of atrial fibrillation with rapid ventricular response.   Current Outpatient Prescriptions  Medication Sig Dispense Refill  . acetaminophen (TYLENOL) 325 MG tablet Take 2 tablets (650 mg total) by mouth every 6 (six) hours as needed (or Fever >/= 101).      Marland Kitchen ALPRAZolam (XANAX) 0.5 MG tablet Take 0.5 mg by mouth 3 (three) times daily as needed. For anxiety.      Marland Kitchen amiodarone (PACERONE) 200 MG tablet Take 1 tablet (200 mg total) by mouth daily.  30 tablet  1  . ARTIFICIAL TEAR OP Apply to eye.      . beclomethasone (QVAR) 80 MCG/ACT inhaler Inhale 1 puff into the lungs 2 (two) times daily.      . bisoprolol (ZEBETA) 10 MG tablet Take 1 tablet (10 mg total) by mouth daily.  30 tablet  3  . cholecalciferol (VITAMIN D) 1000 UNITS tablet Take 2,000 Units by mouth daily.      . dabigatran (PRADAXA) 150 MG CAPS Take 150 mg by mouth every 12 (twelve) hours.      . digoxin (LANOXIN) 0.125 MG tablet Take 0.125 mg by mouth daily.      Marland Kitchen diltiazem (CARDIZEM CD) 360 MG 24 hr capsule Take 1 capsule (360 mg total) by mouth daily.  30 capsule  3  . feeding supplement (GLUCERNA SHAKE) LIQD Take 237 mLs by mouth 2 (two) times daily between meals.      . finasteride (PROSCAR) 5 MG tablet Take 5 mg by mouth daily.      . furosemide  (LASIX) 20 MG tablet Take 20 mg by mouth daily. TAKE one daily      . ipratropium-albuterol (DUONEB) 0.5-2.5 (3) MG/3ML SOLN Take 3 mLs by nebulization every 6 (six) hours.      . isosorbide mononitrate (IMDUR) 60 MG 24 hr tablet Take 60 mg by mouth daily.        . Levalbuterol HCl (XOPENEX IN) Inhale into the lungs as needed.      . nitroGLYCERIN (NITROSTAT) 0.4 MG SL tablet Place 0.4 mg under the tongue every 5 (five) minutes as needed. For chest pain      . oxyCODONE (OXYCONTIN) 10 MG 12 hr tablet Take 10 mg by mouth every 12 (twelve) hours.      . pantoprazole (PROTONIX) 40 MG tablet Take 40 mg by mouth daily.      . predniSONE (DELTASONE) 5 MG tablet Take 10 mg by mouth daily.       . rosuvastatin (CRESTOR) 20 MG tablet Take 20 mg by mouth daily.      . sennosides-docusate sodium (SENOKOT-S) 8.6-50 MG tablet Take 2 tablets by mouth at bedtime.       . Tamsulosin HCl (FLOMAX) 0.4 MG CAPS Take 0.4 mg by mouth daily.      Marland Kitchen  zolpidem (AMBIEN) 5 MG tablet Take 1 tablet (5 mg total) by mouth at bedtime as needed for sleep (insomnia).  30 tablet  1  . [DISCONTINUED] furosemide (LASIX) 20 MG tablet Take 1 tablet (20 mg total) by mouth daily. TAKE TWO TABLETS (20MG ) TWICE DAILY FOR FOUR DAYS THAN DECREASE TO ONE TABLET (20 MG) DAILY  90 tablet  3  . guaiFENesin (MUCINEX) 600 MG 12 hr tablet Take 1,200 mg by mouth 2 (two) times daily.      . insulin aspart (NOVOLOG) 100 UNIT/ML injection Inject 6 Units into the skin 3 (three) times daily with meals.  1 vial  3  . insulin glargine (LANTUS) 100 UNIT/ML injection Inject 22 Units into the skin daily.  10 mL  3  . polyethylene glycol (MIRALAX / GLYCOLAX) packet Take 17 g by mouth daily.        No Known Allergies  Patient Active Problem List  Diagnosis  . CHF (congestive heart failure)  . Abdominal aortic aneurysm  . Emphysema/COPD  . Pacemaker-St.Jude  . Post-nasal drip  . Unstable angina  . NSTEMI (non-ST elevated myocardial infarction)  . CAD  (coronary artery disease)  . Dyspnea  . HLD (hyperlipidemia)  . COPD exacerbation  . Spiritual Distress  . Acute on chronic combined systolic and diastolic heart failure  . Acute-on-chronic respiratory failure  . Hypoxemia  . Bronchitis  . Pulmonary edema  . Type 2 diabetes mellitus  . Chest pain  . COPD (chronic obstructive pulmonary disease)  . Permanent atrial fibrillation  . Depression  . Chronic anticoagulation  . E-coli UTI  . Hyponatremia  . Leg weakness, bilateral    History  Smoking status  . Former Smoker -- 2.0 packs/day for 50 years  . Types: Cigarettes  . Quit date: 09/02/2009  Smokeless tobacco  . Never Used    Comment: smoked for 31yrs quit for 76yrs started back & smoked for 10ys    History  Alcohol Use No    Comment: occ - alcohol    Family History  Problem Relation Age of Onset  . Heart attack Mother 48  . Heart attack Father 22  . Cancer Other     siblings  . Heart attack Other     Review of Systems: The patient denies any heat or cold intolerance.  No weight gain or weight loss.  The patient denies headaches or blurry vision.  There is no cough or sputum production.  The patient denies dizziness.  There is no hematuria or hematochezia.  The patient denies any muscle aches or arthritis.  The patient denies any rash.  The patient denies frequent falling or instability.  There is no history of depression or anxiety.  All other systems were reviewed and are negative.   Physical Exam: Filed Vitals:   07/20/12 1423  BP: 102/56  Pulse: 60   the general appearance reveals an elderly gentleman in no acute distress.Pupils equal and reactive.   Extraocular Movements are full.  There is no scleral icterus.  The mouth and pharynx are normal.  The neck is supple.  The carotids reveal no bruits.  The jugular venous pressure is normal.  The thyroid is not enlarged.  There is no lymphadenopathy.  Chest reveals decreased distant breath sounds bilaterally.   Heart reveals a regular paced rhythm.The abdomen is soft and nontender. Bowel sounds are normal. The liver and spleen are not enlarged. There Are no abdominal masses. There are no bruits.  The pedal pulses  are good.  There is no phlebitis and there is mild ankle edema  There is no cyanosis or clubbing. Strength is normal and symmetrical in all extremities.  There is no lateralizing weakness.  There are no sensory deficits.  The skin is warm and dry.  There is no rash.     Assessment / Plan:  Daily same medication.  Recheck in 4 months for followup office visit and EKG

## 2012-07-20 NOTE — Assessment & Plan Note (Signed)
The patient does not require oxygen.  He has not been experiencing any fever or purulent sputum.  His breathing is at baseline for him.

## 2012-07-20 NOTE — Patient Instructions (Addendum)
Your physician recommends that you continue on your current medications as directed. Please refer to the Current Medication list given to you today.  Your physician recommends that you schedule a follow-up appointment in: 4 month ov/ekg 

## 2012-07-20 NOTE — Assessment & Plan Note (Signed)
Patient has a permanent pacemaker.  He has not been experiencing any dizzy spells or syncope.

## 2012-07-24 ENCOUNTER — Encounter: Payer: Self-pay | Admitting: Internal Medicine

## 2012-07-24 ENCOUNTER — Ambulatory Visit (INDEPENDENT_AMBULATORY_CARE_PROVIDER_SITE_OTHER): Payer: Medicare Other | Admitting: Internal Medicine

## 2012-07-24 VITALS — BP 120/70 | HR 69 | Temp 98.1°F | Ht 69.0 in | Wt 173.2 lb

## 2012-07-24 DIAGNOSIS — J439 Emphysema, unspecified: Secondary | ICD-10-CM

## 2012-07-24 DIAGNOSIS — J438 Other emphysema: Secondary | ICD-10-CM

## 2012-07-24 NOTE — Patient Instructions (Addendum)
#  COPD  stable today  Keep pushing at rehab Continue your copd medicatiions  Glad you had flu shot last month; will ensure documentation Have flu shot in fall Return in 6 months or sooner if needed

## 2012-07-24 NOTE — Progress Notes (Signed)
Subjective:    Patient ID: [name], male    DOB: October 04, 1927, 78 y.o.   MRN: [id number]  HPI Problem list 1. 75 pack smoking hx. Quit 2010. Known CAD  2. COPD - Gold stage 3-4  On atrovent, pulmicort nebs - PFTs 11/21/2010  - spirometry only: Fev1 0.84L/31%, Ratio 45. Gold stage 3-4 COPD Spiro 04/26/2011 - fev1 1.06L/34% CAT score 17 - 02/04/2012 Did not desaturate May 2012 185 feet x 3 laps.   3. AECOPD hx:   -March 2012 (LLL PNA),   - April 2012 - clear cxr - Acute Visit 03/05/12 - OPD Rx - June 2013 - a fib and aECopd Admit - July 2013  - for HCAP  OV 05/07/2012  Followup severe copd. Since las visit admitted twice. First 03/10/12 to 03/19/12 for A Fib and AECOPD. 2nd one is:  04/04/12 to 04/14/12 for Acuate on chronic resp failure due to HCAP (not intubated).  Now also with foley for several days. With atll this he is more deconditiponed. He is at [facility] rehab. ECOG is 3-4. Needs helps with ADL including shower. Spends most days in bed/chair. Periodically dejected about step ladder down pattern of life. He knows chronic illnesses catching up and is predictive of mortality. CAT scre is 24 which is a decline from 53 in May 2013 and reflect steady decline.  Reports dyspne aand cough stable. No fever. Does not want pulmicort nebs anymore; paradoxically worsens dyspnea. He is ok with trying qvar mdi. Wants xopenex mdi sample.   REC   Past, Family, Social reviewed: as in HPI   #COPD  stable today  as discussed your chronic health issues are slowly gaining ground on you  Keep pushing at rehab  Continue your copd medicatiions but instead of pulmicort nebulizer take qvar 2 puff twice daily - take sample and prescription and show technique  Take sample xopenex mdi  Have flu shot in fall  Return in 3 months or sooner if needed   OV 07/24/2012  Well. At [facility]. Improviong mood and conditioning. Doing exercises. Some phlegm past few days but improiving. Does  not feel he is in aecopd. CAT score improved from 24 to 14  CAT COPD Symptom & Quality of Life Score (GSK trademark) 0 is no burden. 5 is highest burden 05/07/2012  07/24/2012   Never Cough -> Cough all the time 2 2  No phlegm in chest -> Chest is full of phlegm 2 3  No chest tightness -> Chest feels very tight 1 0  No dyspnea for 1 flight stairs/hill -> Very dyspneic for 1 flight of stairs 5 5  No limitations for ADL at home -> Very limited with ADL at home 5 1  Confident leaving home -> Not at all confident leaving home 4 0  Sleep soundly -> Do not sleep soundly because of lung condition 1 0  Lots of Energy -> No energy at all 4 3  TOTAL Score (max 40)  24 14    Past, Family, Social reviewed: no change since last visit    Review of Systems  Constitutional: Negative for fever and unexpected weight change.  HENT: Negative for ear pain, nosebleeds, congestion, sore throat, rhinorrhea, sneezing, trouble swallowing, dental problem, postnasal drip and sinus pressure.   Eyes: Negative for redness and itching.  Respiratory: Positive for cough. Negative for chest tightness, shortness of breath and wheezing.   Cardiovascular: Negative for palpitations and leg swelling.  Gastrointestinal: Negative for  nausea and vomiting.  Genitourinary: Negative for dysuria.  Musculoskeletal: Negative for joint swelling.  Skin: Negative for rash.  Neurological: Negative for headaches.  Hematological: Does not bruise/bleed easily.  Psychiatric/Behavioral: Negative for dysphoric mood. The patient is not nervous/anxious.        Objective:   Physical Exam Nursing note and vitals reviewed. Constitutional: He is oriented to person, place, and time. He appears well-developed and well-nourished. No distress.       Deconditioned Body mass index is 24.78 kg/(m^2). Sitting in chair with stroller  HENT:  Head: Normocephalic and atraumatic.  Right Ear: External ear normal.  Left Ear: External ear normal.    Mouth/Throat: Oropharynx is clear and moist. No oropharyngeal exudate.  Eyes: Conjunctivae and EOM are normal. Pupils are equal, round, and reactive to light. Right eye exhibits no discharge. Left eye exhibits no discharge. No scleral icterus.  Neck: Normal range of motion. Neck supple. No JVD present. No tracheal deviation present. No thyromegaly present.  Cardiovascular: Normal rate, regular rhythm and intact distal pulses.  Exam reveals no gallop and no friction rub.   No murmur heard. Pulmonary/Chest: Effort normal and breath sounds normal. No respiratory distress. He has no wheezes. He has no rales. He exhibits no tenderness.  Abdominal: Soft. Bowel sounds are normal. He exhibits no distension and no mass. There is no tenderness. There is no rebound and no guarding.    Musculoskeletal: Normal range of motion. He exhibits no edema and no tenderness.  Lymphadenopathy:    He has no cervical adenopathy.  Neurological: He is alert and oriented to person, place, and time. He has normal reflexes. No cranial nerve deficit. Coordination normal.  Skin: Skin is warm and dry. No rash noted. He is not diaphoretic. No erythema. No pallor.  Psychiatric: He has a normal mood and affect. His behavior is normal. Judgment and thought content normal.            Assessment & Plan:

## 2012-07-26 NOTE — Assessment & Plan Note (Signed)
#  COPD  stable today  Keep pushing at rehab Continue your copd medicatiions  Glad you had flu shot last month; will ensure documentation Have flu shot in fall Return in 6 months or sooner if needed  

## 2012-09-13 IMAGING — CR DG CHEST 2V
2 series · 2 of 2 positions shown · non-contrast
Comparison: 10/05/2006

CLINICAL DATA: Preop evaluation for right hip surgery

CHEST - 2 VIEW

[w chest pa]
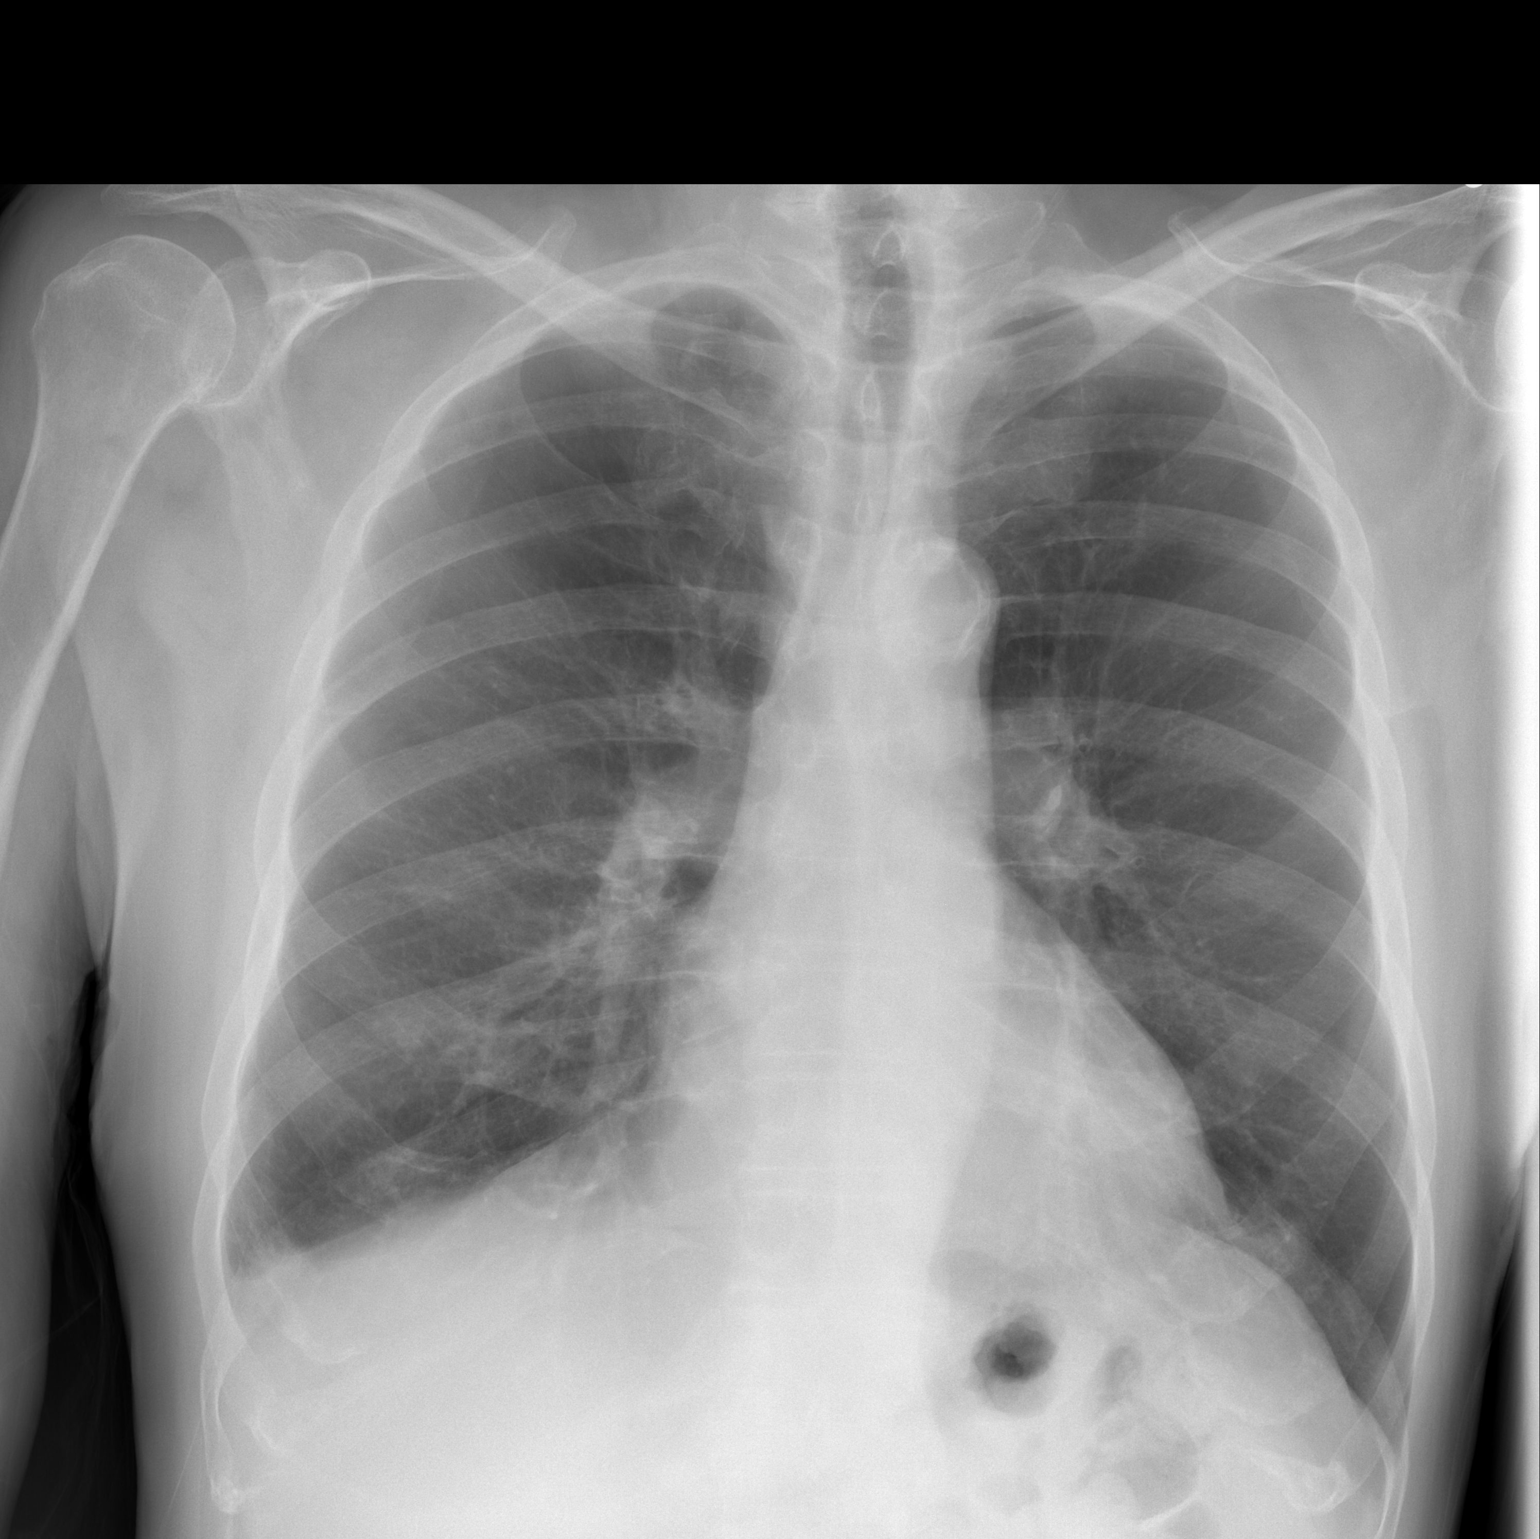

[w chest lat]
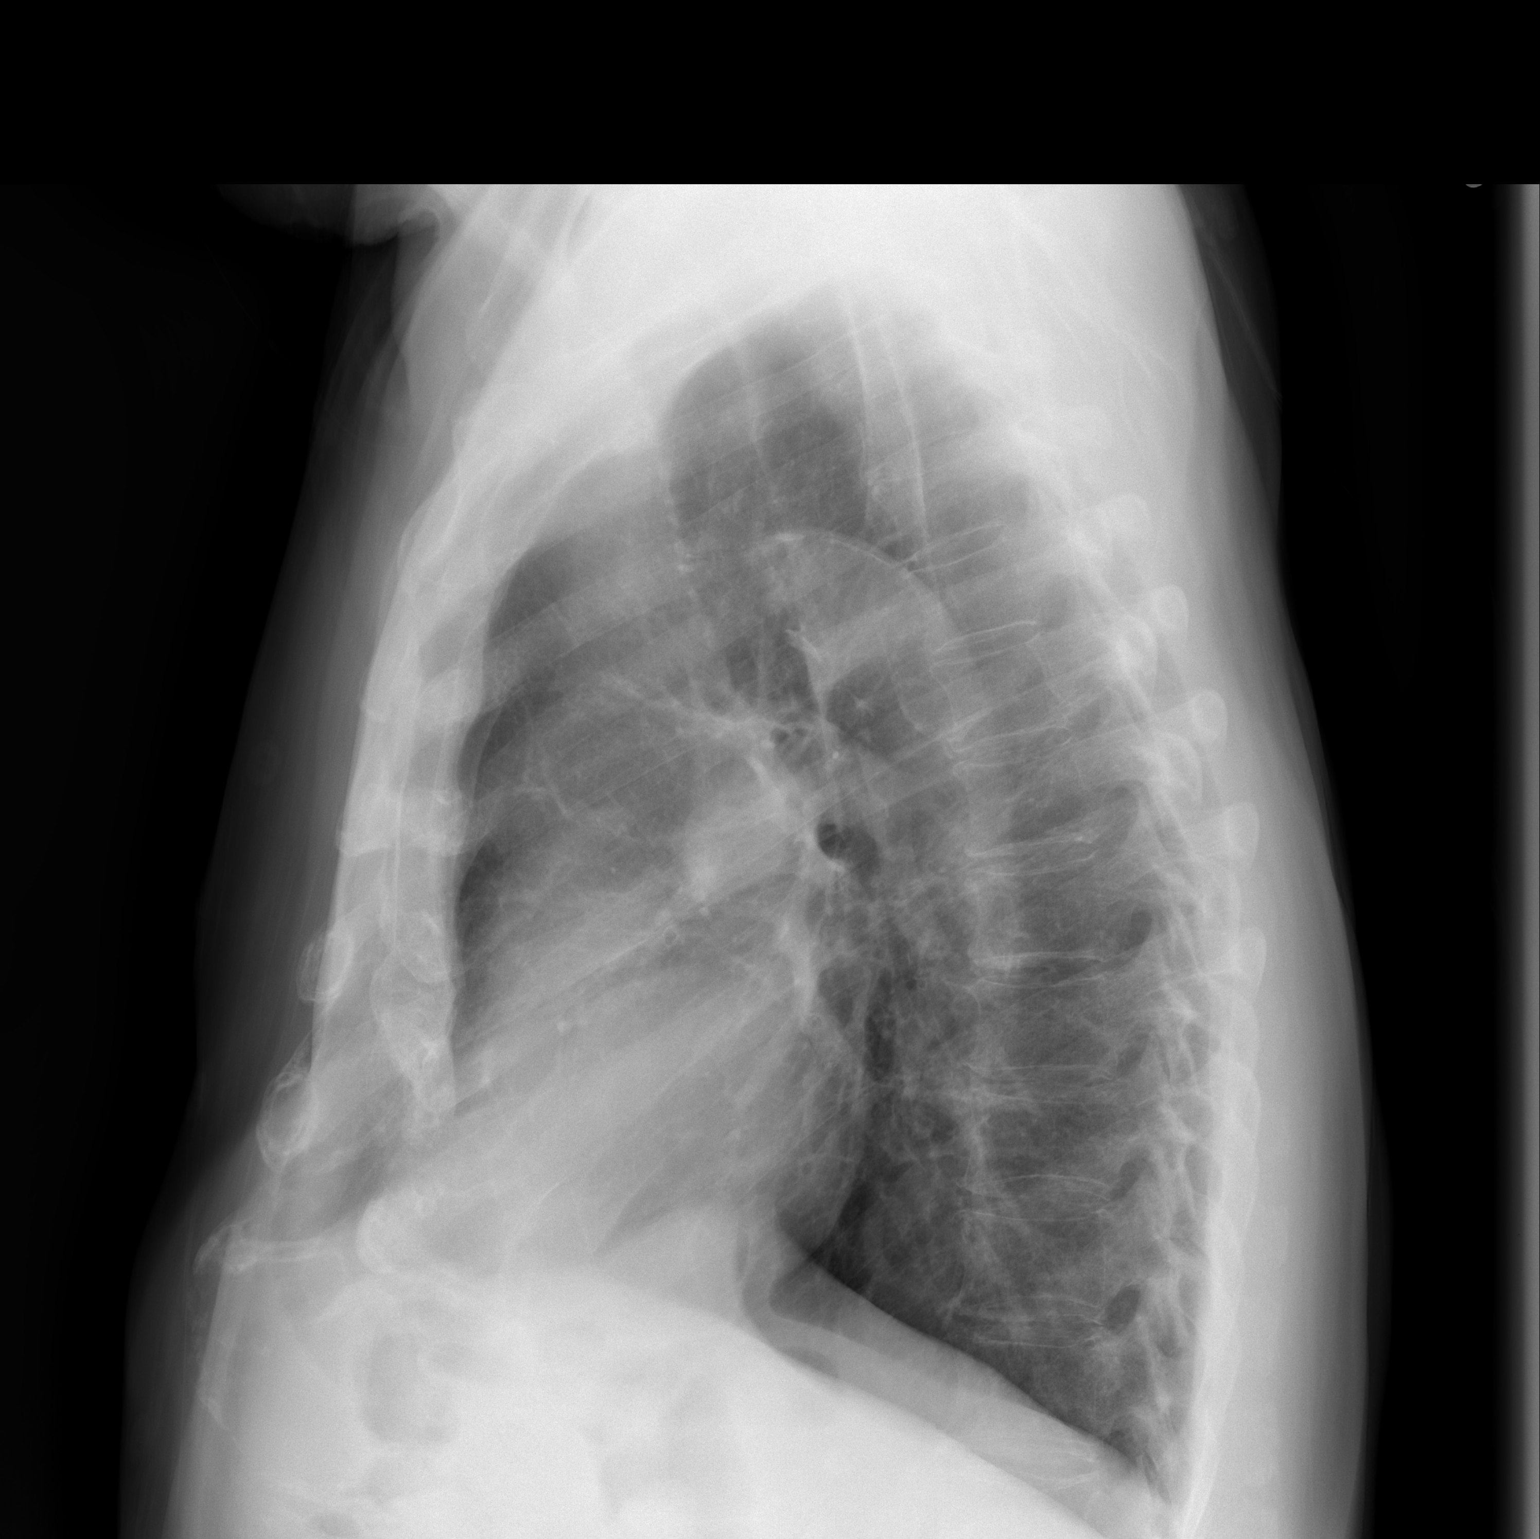

[2 of 2 positions shown; findings below may reference images not displayed]

FINDINGS: Normal heart size and vascularity.  Mild hyperinflation
suspicious for a degree of COPD/emphysema.  Negative for edema,
pneumonia, definite effusion or pneumothorax.  Midline trachea.
Atherosclerosis of the aorta.
IMPRESSION: Stable chest exam.  No superimposed acute process.

## 2012-09-27 ENCOUNTER — Encounter (HOSPITAL_COMMUNITY): Payer: Self-pay | Admitting: Emergency Medicine

## 2012-09-27 ENCOUNTER — Other Ambulatory Visit: Payer: Self-pay

## 2012-09-27 ENCOUNTER — Inpatient Hospital Stay (HOSPITAL_COMMUNITY)
Admission: EM | Admit: 2012-09-27 | Discharge: 2012-10-01 | DRG: 190 | Disposition: A | Discharge status: Skilled Nursing Facility | Payer: Medicare Other | Attending: Internal Medicine | Admitting: Internal Medicine

## 2012-09-27 ENCOUNTER — Emergency Department (HOSPITAL_COMMUNITY): Payer: Medicare Other

## 2012-09-27 DIAGNOSIS — I4891 Unspecified atrial fibrillation: Secondary | ICD-10-CM | POA: Diagnosis present

## 2012-09-27 DIAGNOSIS — I2510 Atherosclerotic heart disease of native coronary artery without angina pectoris: Secondary | ICD-10-CM | POA: Diagnosis present

## 2012-09-27 DIAGNOSIS — I4821 Permanent atrial fibrillation: Secondary | ICD-10-CM | POA: Diagnosis present

## 2012-09-27 DIAGNOSIS — I2589 Other forms of chronic ischemic heart disease: Secondary | ICD-10-CM | POA: Diagnosis present

## 2012-09-27 DIAGNOSIS — E785 Hyperlipidemia, unspecified: Secondary | ICD-10-CM | POA: Diagnosis present

## 2012-09-27 DIAGNOSIS — J441 Chronic obstructive pulmonary disease with (acute) exacerbation: Principal | ICD-10-CM | POA: Diagnosis present

## 2012-09-27 DIAGNOSIS — I714 Abdominal aortic aneurysm, without rupture, unspecified: Secondary | ICD-10-CM | POA: Diagnosis present

## 2012-09-27 DIAGNOSIS — F411 Generalized anxiety disorder: Secondary | ICD-10-CM | POA: Diagnosis present

## 2012-09-27 DIAGNOSIS — I739 Peripheral vascular disease, unspecified: Secondary | ICD-10-CM | POA: Diagnosis present

## 2012-09-27 DIAGNOSIS — Z9861 Coronary angioplasty status: Secondary | ICD-10-CM

## 2012-09-27 DIAGNOSIS — E119 Type 2 diabetes mellitus without complications: Secondary | ICD-10-CM | POA: Diagnosis present

## 2012-09-27 DIAGNOSIS — Z96649 Presence of unspecified artificial hip joint: Secondary | ICD-10-CM

## 2012-09-27 DIAGNOSIS — J9620 Acute and chronic respiratory failure, unspecified whether with hypoxia or hypercapnia: Secondary | ICD-10-CM | POA: Diagnosis present

## 2012-09-27 DIAGNOSIS — K219 Gastro-esophageal reflux disease without esophagitis: Secondary | ICD-10-CM | POA: Diagnosis present

## 2012-09-27 DIAGNOSIS — Z950 Presence of cardiac pacemaker: Secondary | ICD-10-CM

## 2012-09-27 DIAGNOSIS — I5042 Chronic combined systolic (congestive) and diastolic (congestive) heart failure: Secondary | ICD-10-CM | POA: Diagnosis present

## 2012-09-27 DIAGNOSIS — I252 Old myocardial infarction: Secondary | ICD-10-CM

## 2012-09-27 DIAGNOSIS — Z7901 Long term (current) use of anticoagulants: Secondary | ICD-10-CM

## 2012-09-27 DIAGNOSIS — Z87891 Personal history of nicotine dependence: Secondary | ICD-10-CM

## 2012-09-27 DIAGNOSIS — I509 Heart failure, unspecified: Secondary | ICD-10-CM | POA: Diagnosis present

## 2012-09-27 DIAGNOSIS — I10 Essential (primary) hypertension: Secondary | ICD-10-CM | POA: Diagnosis present

## 2012-09-27 DIAGNOSIS — R5381 Other malaise: Secondary | ICD-10-CM | POA: Diagnosis present

## 2012-09-27 DIAGNOSIS — Z66 Do not resuscitate: Secondary | ICD-10-CM | POA: Diagnosis present

## 2012-09-27 LAB — CBC WITH DIFFERENTIAL/PLATELET
HCT: 40.4 % (ref 39.0–52.0)
Hemoglobin: 12.5 g/dL — ABNORMAL LOW (ref 13.0–17.0)
Lymphocytes Relative: 9 % — ABNORMAL LOW (ref 12–46)
Monocytes Absolute: 0.8 10*3/uL (ref 0.1–1.0)
Monocytes Relative: 8 % (ref 3–12)
Neutro Abs: 8 10*3/uL — ABNORMAL HIGH (ref 1.7–7.7)
WBC: 9.7 10*3/uL (ref 4.0–10.5)

## 2012-09-27 LAB — POCT I-STAT, CHEM 8
Glucose, Bld: 164 mg/dL — ABNORMAL HIGH (ref 70–99)
HCT: 38 % — ABNORMAL LOW (ref 39.0–52.0)
Hemoglobin: 12.9 g/dL — ABNORMAL LOW (ref 13.0–17.0)
Potassium: 4.5 mEq/L (ref 3.5–5.1)
Sodium: 140 mEq/L (ref 135–145)

## 2012-09-27 LAB — DIGOXIN LEVEL: Digoxin Level: 0.8 ng/mL (ref 0.8–2.0)

## 2012-09-27 LAB — PRO B NATRIURETIC PEPTIDE: Pro B Natriuretic peptide (BNP): 1696 pg/mL — ABNORMAL HIGH (ref 0–450)

## 2012-09-27 LAB — POCT I-STAT TROPONIN I

## 2012-09-27 LAB — GLUCOSE, CAPILLARY
Glucose-Capillary: 206 mg/dL — ABNORMAL HIGH (ref 70–99)
Glucose-Capillary: 334 mg/dL — ABNORMAL HIGH (ref 70–99)

## 2012-09-27 LAB — PROTIME-INR: INR: 2.43 — ABNORMAL HIGH (ref 0.00–1.49)

## 2012-09-27 MED ORDER — GUAIFENESIN ER 600 MG PO TB12: 600.0000 mg | ORAL_TABLET | Freq: Two times a day (BID) | ORAL | Status: DC

## 2012-09-27 MED ORDER — ALBUTEROL SULFATE (5 MG/ML) 0.5% IN NEBU
5.0000 mg | INHALATION_SOLUTION | Freq: Once | RESPIRATORY_TRACT | Status: AC
  Administered 2012-09-27: 5 mg via RESPIRATORY_TRACT

## 2012-09-27 MED ORDER — ALBUTEROL SULFATE (5 MG/ML) 0.5% IN NEBU
2.5000 mg | INHALATION_SOLUTION | Freq: Four times a day (QID) | RESPIRATORY_TRACT | Status: DC
  Administered 2012-09-27 – 2012-09-28 (×5): 2.5 mg via RESPIRATORY_TRACT
  Filled 2012-09-27 (×5): qty 0.5

## 2012-09-27 MED ORDER — ACETAMINOPHEN 650 MG RE SUPP: 650.0000 mg | Freq: Four times a day (QID) | RECTAL | Status: DC | PRN

## 2012-09-27 MED ORDER — LEVOFLOXACIN IN D5W 500 MG/100ML IV SOLN
500.0000 mg | Freq: Once | INTRAVENOUS | Status: AC
  Administered 2012-09-27: 500 mg via INTRAVENOUS
  Filled 2012-09-27: qty 100

## 2012-09-27 MED ORDER — DIGOXIN 125 MCG PO TABS
0.1250 mg | ORAL_TABLET | Freq: Every day | ORAL | Status: DC
  Administered 2012-09-27 – 2012-10-01 (×5): 0.125 mg via ORAL
  Filled 2012-09-27 (×6): qty 1

## 2012-09-27 MED ORDER — DABIGATRAN ETEXILATE MESYLATE 150 MG PO CAPS
150.0000 mg | ORAL_CAPSULE | Freq: Two times a day (BID) | ORAL | Status: DC
  Administered 2012-09-27 – 2012-10-01 (×9): 150 mg via ORAL
  Filled 2012-09-27 (×10): qty 1

## 2012-09-27 MED ORDER — NITROGLYCERIN 0.4 MG SL SUBL: 0.4000 mg | SUBLINGUAL_TABLET | SUBLINGUAL | Status: DC | PRN

## 2012-09-27 MED ORDER — ALUM & MAG HYDROXIDE-SIMETH 200-200-20 MG/5ML PO SUSP: 30.0000 mL | Freq: Four times a day (QID) | ORAL | Status: DC | PRN

## 2012-09-27 MED ORDER — ALBUTEROL SULFATE (5 MG/ML) 0.5% IN NEBU
2.5000 mg | INHALATION_SOLUTION | Freq: Once | RESPIRATORY_TRACT | Status: AC
  Administered 2012-09-27: 2.5 mg via RESPIRATORY_TRACT
  Filled 2012-09-27: qty 0.5

## 2012-09-27 MED ORDER — FUROSEMIDE 20 MG PO TABS
20.0000 mg | ORAL_TABLET | Freq: Every day | ORAL | Status: DC
  Administered 2012-09-27 – 2012-10-01 (×5): 20 mg via ORAL
  Filled 2012-09-27 (×5): qty 1

## 2012-09-27 MED ORDER — SODIUM CHLORIDE 0.45 % IV SOLN
INTRAVENOUS | Status: DC
  Administered 2012-09-27: 13:00:00 via INTRAVENOUS

## 2012-09-27 MED ORDER — ACETAMINOPHEN 325 MG PO TABS: 650.0000 mg | ORAL_TABLET | Freq: Four times a day (QID) | ORAL | Status: DC | PRN

## 2012-09-27 MED ORDER — AMIODARONE HCL 200 MG PO TABS
200.0000 mg | ORAL_TABLET | Freq: Every day | ORAL | Status: DC
Start: 2012-09-27 — End: 2012-10-01
  Administered 2012-09-27 – 2012-10-01 (×5): 200 mg via ORAL
  Filled 2012-09-27 (×5): qty 1

## 2012-09-27 MED ORDER — IPRATROPIUM BROMIDE 0.02 % IN SOLN
0.5000 mg | Freq: Four times a day (QID) | RESPIRATORY_TRACT | Status: DC
  Administered 2012-09-27 – 2012-09-28 (×5): 0.5 mg via RESPIRATORY_TRACT
  Filled 2012-09-27 (×5): qty 2.5

## 2012-09-27 MED ORDER — TAMSULOSIN HCL 0.4 MG PO CAPS
0.4000 mg | ORAL_CAPSULE | Freq: Every day | ORAL | Status: DC
  Administered 2012-09-27 – 2012-10-01 (×5): 0.4 mg via ORAL
  Filled 2012-09-27 (×5): qty 1

## 2012-09-27 MED ORDER — ALBUTEROL SULFATE (5 MG/ML) 0.5% IN NEBU
2.5000 mg | INHALATION_SOLUTION | RESPIRATORY_TRACT | Status: DC | PRN
  Administered 2012-09-27 – 2012-09-29 (×4): 2.5 mg via RESPIRATORY_TRACT
  Filled 2012-09-27 (×3): qty 0.5

## 2012-09-27 MED ORDER — ALBUTEROL SULFATE (5 MG/ML) 0.5% IN NEBU
2.5000 mg | INHALATION_SOLUTION | Freq: Once | RESPIRATORY_TRACT | Status: DC
  Filled 2012-09-27: qty 1

## 2012-09-27 MED ORDER — SODIUM CHLORIDE 0.9 % IJ SOLN
3.0000 mL | Freq: Two times a day (BID) | INTRAMUSCULAR | Status: DC
  Administered 2012-09-27 – 2012-10-01 (×8): 3 mL via INTRAVENOUS

## 2012-09-27 MED ORDER — VITAMIN D3 25 MCG (1000 UNIT) PO TABS
2000.0000 [IU] | ORAL_TABLET | Freq: Every day | ORAL | Status: DC
  Administered 2012-09-27 – 2012-10-01 (×5): 2000 [IU] via ORAL
  Filled 2012-09-27 (×5): qty 2

## 2012-09-27 MED ORDER — SODIUM CHLORIDE 0.9 % IV SOLN
Freq: Once | INTRAVENOUS | Status: AC
Start: 2012-09-27 — End: 2012-09-27
  Administered 2012-09-27: 07:00:00 via INTRAVENOUS

## 2012-09-27 MED ORDER — ALBUTEROL SULFATE (5 MG/ML) 0.5% IN NEBU: 2.5000 mg | INHALATION_SOLUTION | RESPIRATORY_TRACT | Status: DC

## 2012-09-27 MED ORDER — DILTIAZEM HCL ER COATED BEADS 360 MG PO CP24
360.0000 mg | ORAL_CAPSULE | Freq: Every day | ORAL | Status: DC
  Administered 2012-09-27 – 2012-10-01 (×5): 360 mg via ORAL
  Filled 2012-09-27 (×5): qty 1

## 2012-09-27 MED ORDER — ZOLPIDEM TARTRATE 5 MG PO TABS: 5.0000 mg | ORAL_TABLET | Freq: Every evening | ORAL | Status: DC | PRN

## 2012-09-27 MED ORDER — GUAIFENESIN ER 600 MG PO TB12
1200.0000 mg | ORAL_TABLET | Freq: Two times a day (BID) | ORAL | Status: DC
  Administered 2012-09-27 – 2012-10-01 (×9): 1200 mg via ORAL
  Filled 2012-09-27 (×10): qty 2

## 2012-09-27 MED ORDER — INSULIN ASPART 100 UNIT/ML ~~LOC~~ SOLN
0.0000 [IU] | Freq: Every day | SUBCUTANEOUS | Status: DC
  Administered 2012-09-28: 3 [IU] via SUBCUTANEOUS

## 2012-09-27 MED ORDER — SENNA-DOCUSATE SODIUM 8.6-50 MG PO TABS
2.0000 | ORAL_TABLET | Freq: Every day | ORAL | Status: DC
  Administered 2012-09-27 – 2012-09-30 (×4): 2 via ORAL
  Filled 2012-09-27 (×6): qty 2

## 2012-09-27 MED ORDER — METHYLPREDNISOLONE SODIUM SUCC 125 MG IJ SOLR
80.0000 mg | Freq: Two times a day (BID) | INTRAMUSCULAR | Status: DC
  Administered 2012-09-27 – 2012-09-28 (×2): 80 mg via INTRAVENOUS
  Filled 2012-09-27 (×4): qty 1.28

## 2012-09-27 MED ORDER — INSULIN ASPART 100 UNIT/ML ~~LOC~~ SOLN
0.0000 [IU] | Freq: Three times a day (TID) | SUBCUTANEOUS | Status: DC
  Administered 2012-09-27: 7 [IU] via SUBCUTANEOUS
  Administered 2012-09-27: 3 [IU] via SUBCUTANEOUS
  Administered 2012-09-28 (×3): 2 [IU] via SUBCUTANEOUS
  Administered 2012-09-29 (×2): 1 [IU] via SUBCUTANEOUS
  Administered 2012-09-29: 2 [IU] via SUBCUTANEOUS
  Administered 2012-09-30: 1 [IU] via SUBCUTANEOUS
  Administered 2012-09-30: 2 [IU] via SUBCUTANEOUS
  Administered 2012-10-01: 3 [IU] via SUBCUTANEOUS

## 2012-09-27 MED ORDER — IPRATROPIUM BROMIDE 0.02 % IN SOLN
0.5000 mg | Freq: Once | RESPIRATORY_TRACT | Status: AC
  Administered 2012-09-27: 0.5 mg via RESPIRATORY_TRACT
  Filled 2012-09-27: qty 2.5

## 2012-09-27 MED ORDER — BISOPROLOL FUMARATE 10 MG PO TABS
10.0000 mg | ORAL_TABLET | Freq: Every day | ORAL | Status: DC
  Administered 2012-09-27 – 2012-10-01 (×5): 10 mg via ORAL
  Filled 2012-09-27 (×5): qty 1

## 2012-09-27 MED ORDER — ALBUTEROL SULFATE (5 MG/ML) 0.5% IN NEBU
INHALATION_SOLUTION | RESPIRATORY_TRACT | Status: AC
  Filled 2012-09-27: qty 0.5

## 2012-09-27 MED ORDER — FINASTERIDE 5 MG PO TABS
5.0000 mg | ORAL_TABLET | Freq: Every day | ORAL | Status: DC
  Administered 2012-09-27 – 2012-10-01 (×5): 5 mg via ORAL
  Filled 2012-09-27 (×5): qty 1

## 2012-09-27 MED ORDER — LEVOFLOXACIN IN D5W 500 MG/100ML IV SOLN
500.0000 mg | INTRAVENOUS | Status: DC
  Administered 2012-09-28: 500 mg via INTRAVENOUS
  Filled 2012-09-27: qty 100

## 2012-09-27 MED ORDER — ATORVASTATIN CALCIUM 10 MG PO TABS
10.0000 mg | ORAL_TABLET | Freq: Every day | ORAL | Status: DC
  Administered 2012-09-27 – 2012-09-30 (×4): 10 mg via ORAL
  Filled 2012-09-27 (×5): qty 1

## 2012-09-27 MED ORDER — ISOSORBIDE MONONITRATE ER 60 MG PO TB24
60.0000 mg | ORAL_TABLET | Freq: Every day | ORAL | Status: DC
  Administered 2012-09-27 – 2012-09-30 (×4): 60 mg via ORAL
  Filled 2012-09-27 (×5): qty 1

## 2012-09-27 NOTE — ED Notes (Signed)
Talking with family member, states he is feeling better, no shob at this time

## 2012-09-27 NOTE — ED Notes (Addendum)
Family at bedside at this time. Patient continues to talk in complete sentences. No respiratory distress noted. Patient remains on O2 .Ax4.

## 2012-09-27 NOTE — ED Provider Notes (Signed)
History     CSN: [id number]  Arrival date & time 09/27/12  1191   First MD Initiated Contact with Patient 09/27/12 [phone]      Chief Complaint  Patient presents with  . Respiratory Distress    (Consider location/radiation/quality/duration/timing/severity/associated sxs/prior treatment) HPI Comments: 79 year old male who presents to the hospital by paramedics secondary to severe shortness of breath. The patient states that he awoke this morning and had a sore throat and increased cough which was productive of a white sputum, shortness of breath has been getting worse all day long. He has been able to sleep on his back without orthopnea. The shortness of breath is persistent, severe, gradually getting worse and the paramedics required BiPAP to help control his symptoms. He was also having severe wheezing and required albuterol treatment and Solu-Medrol on the ambulance which helped his symptoms significantly per his report. And on arrival the patient is speaking in full sentences. The patient denies chest pain, back pain, abdominal pain, dysuria, diarrhea, rashes, swelling, headache, nasal congestion.  The history is provided by the patient and the EMS personnel.    Past Medical History  Diagnosis Date  . Pneumonia 12/06/10    healthcare-associated/ left lower lobe  . COPD (chronic obstructive pulmonary disease)     severe stage IV  . Atrial fibrillation     on Coumadin with St. Jude single-chamber pacemaker  . Diabetes mellitus     type 2; s/p Prednisone therapy for pneumonia 12/2010  . Coronary artery disease     s/p anterior STEMI 09/2009; cath. revealed mid LAD 40%, distal LAD 100%- PTCA, prox. RCA 40%, mid. RCA 100% with good collateral filling of PDA; EF 25%; NSTEMI 07/2011 - PCI/DES 100% LCX  . ST elevation (STEMI) myocardial infarction 01//11/12    anterior  . CHF (congestive heart failure)     EF 25% on 09/26/10; EF 50-55% and grade 1 diastolic dysfunction on ECHO 08/2010   .  Hypertension   . Hyperlipidemia   . AAA (abdominal aortic aneurysm)     3 cm infrarenal abdominal aortic aneurysm per aorta ultrasound 03/2010  . Osteoarthritis     s/p bilat hip arthroplasty  . Angina   . Ischemic cardiomyopathy     EF 35% LHC 11/12  . GERD (gastroesophageal reflux disease)   . Hypercholesteremia   . PVD (peripheral vascular disease)   . Anxiety     Past Surgical History  Procedure Date  . Hip arthroplasty     s/p right 09/27/10 and s/p left 09/24/10  . Back surgery   . Knee surgery   . Pacemaker insertion 12/2010    St. Jude single-chamber   . Cardiac catheterization 09/2009, 06/2007    PTCA to distal LAD    Family History  Problem Relation Age of Onset  . Heart attack Mother 10  . Heart attack Father 2  . Cancer Other     siblings  . Heart attack Other     History  Substance Use Topics  . Smoking status: Former Smoker -- 2.0 packs/day for 50 years    Types: Cigarettes    Quit date: 09/02/2009  . Smokeless tobacco: Never Used     Comment: smoked for 21yrs quit for 69yrs started back & smoked for 10ys  . Alcohol Use: No     Comment: occ - alcohol      Review of Systems  All other systems reviewed and are negative.    Allergies  Review of patient's allergies  indicates no known allergies.  Home Medications   Current Outpatient Rx  Name  Route  Sig  Dispense  Refill  . AMIODARONE HCL 200 MG PO TABS   Oral   Take 1 tablet (200 mg total) by mouth daily.   30 tablet   1   . BECLOMETHASONE DIPROPIONATE 80 MCG/ACT IN AERS   Inhalation   Inhale 1 puff into the lungs 2 (two) times daily.         Marland Kitchen BISOPROLOL FUMARATE 10 MG PO TABS   Oral   Take 1 tablet (10 mg total) by mouth daily.   30 tablet   3   . VITAMIN D 1000 UNITS PO TABS   Oral   Take 2,000 Units by mouth daily.         Marland Kitchen DABIGATRAN ETEXILATE MESYLATE 150 MG PO CAPS   Oral   Take 150 mg by mouth every 12 (twelve) hours.         Marland Kitchen DIGOXIN 0.125 MG PO TABS    Oral   Take 0.125 mg by mouth daily.         Marland Kitchen DILTIAZEM HCL ER COATED BEADS 360 MG PO CP24   Oral   Take 1 capsule (360 mg total) by mouth daily.   30 capsule   3   . FINASTERIDE 5 MG PO TABS   Oral   Take 5 mg by mouth daily.         . FUROSEMIDE 20 MG PO TABS   Oral   Take 20 mg by mouth daily. TAKE one daily         . GUAIFENESIN ER 600 MG PO TB12   Oral   Take 1,200 mg by mouth 2 (two) times daily.         . IPRATROPIUM-ALBUTEROL 0.5-2.5 (3) MG/3ML IN SOLN   Nebulization   Take 3 mLs by nebulization every 6 (six) hours.         . ISOSORBIDE MONONITRATE ER 60 MG PO TB24   Oral   Take 60 mg by mouth daily.           Marland Kitchen NITROGLYCERIN 0.4 MG SL SUBL   Sublingual   Place 0.4 mg under the tongue every 5 (five) minutes as needed. For chest pain         . PREDNISONE 5 MG PO TABS   Oral   Take 5 mg by mouth daily.          Marland Kitchen ROSUVASTATIN CALCIUM 20 MG PO TABS   Oral   Take 20 mg by mouth daily.         . SENNA-DOCUSATE SODIUM 8.6-50 MG PO TABS   Oral   Take 2 tablets by mouth at bedtime.          . TAMSULOSIN HCL 0.4 MG PO CAPS   Oral   Take 0.4 mg by mouth daily.         Marland Kitchen GLUCERNA SHAKE PO LIQD   Oral   Take 237 mLs by mouth 2 (two) times daily between meals.         Marland Kitchen ZOLPIDEM TARTRATE 5 MG PO TABS   Oral   Take 1 tablet (5 mg total) by mouth at bedtime as needed for sleep (insomnia).   30 tablet   1     BP 132/42  Pulse 55  Temp 99.1 F (37.3 C)  Resp 24  SpO2 98%  Physical Exam  Nursing note and vitals  reviewed. Constitutional: He appears well-developed and well-nourished. No distress.  HENT:  Head: Normocephalic and atraumatic.  Mouth/Throat: Oropharynx is clear and moist. No oropharyngeal exudate.  Eyes: Conjunctivae normal and EOM are normal. Pupils are equal, round, and reactive to light. Right eye exhibits no discharge. Left eye exhibits no discharge. No scleral icterus.  Neck: Normal range of motion. Neck supple.  No JVD present. No thyromegaly present.  Cardiovascular: Normal rate, regular rhythm, normal heart sounds and intact distal pulses.  Exam reveals no gallop and no friction rub.   No murmur heard. Pulmonary/Chest: He is in respiratory distress. He has wheezes. He has no rales.       Increased work of breathing, scattered rales at the bases, expiratory wheezing diffusely, speaking in full sentences  Abdominal: Soft. Bowel sounds are normal. He exhibits no distension and no mass. There is no tenderness.  Musculoskeletal: Normal range of motion. He exhibits no edema and no tenderness.  Lymphadenopathy:    He has no cervical adenopathy.  Neurological: He is alert. Coordination normal.  Skin: Skin is warm and dry. No rash noted. No erythema.  Psychiatric: He has a normal mood and affect. His behavior is normal.    ED Course  Procedures (including critical care time)  Labs Reviewed  CBC WITH DIFFERENTIAL - Abnormal; Notable for the following:    Hemoglobin 12.5 (*)     RDW 16.9 (*)     Platelets 132 (*)     Neutrophils Relative 82 (*)     Neutro Abs 8.0 (*)     Lymphocytes Relative 9 (*)     All other components within normal limits  APTT - Abnormal; Notable for the following:    aPTT 76 (*)     All other components within normal limits  PROTIME-INR - Abnormal; Notable for the following:    Prothrombin Time 25.3 (*)     INR 2.43 (*)     All other components within normal limits  PRO B NATRIURETIC PEPTIDE - Abnormal; Notable for the following:    Pro B Natriuretic peptide (BNP) 1696.0 (*)     All other components within normal limits  POCT I-STAT, CHEM 8 - Abnormal; Notable for the following:    Glucose, Bld 164 (*)     Hemoglobin 12.9 (*)     HCT 38.0 (*)     All other components within normal limits  DIGOXIN LEVEL  POCT I-STAT TROPONIN I   Dg Chest Port 1 View  09/27/2012  *RADIOLOGY REPORT*  Clinical Data: Chest pain, shortness of breath.  PORTABLE CHEST - 1 VIEW  Comparison:  04/20/2012  Findings: Left pacer remains in place, unchanged. There is hyperinflation of the lungs compatible with COPD.  Mild cardiomegaly.  No focal airspace opacity.  Scarring in the lung bases.  Small right pleural effusion versus pleural thickening, stable.  IMPRESSION: COPD/chronic changes.  Mild cardiomegaly.  Small right pleural effusion versus pleural thickening, stable.   Original Report Authenticated By: Charlett Nose, M.D.      1. COPD exacerbation       MDM  The patient has respiratory distress likely secondary to either COPD or congestive heart failure as a cardiac wheeze. Chest x-ray ordered, labs, EKG shows a paced rhythm. The patient has improved significantly after BiPAP and though he appeared critically ill at this time it appears that he is improving.  ED ECG REPORT  I personally interpreted this EKG   Date: 09/27/2012   Rate: 57  Rhythm:  Paced rhythm  QRS Axis: normal  Intervals: normal  ST/T Wave abnormalities: nonspecific T wave changes  Conduction Disutrbances:nonspecific intraventricular conduction delay  Narrative Interpretation:   Old EKG Reviewed: Compared with 04/20/2012, paced rhythm still present, rate slower  The patient has improved significantly though he still has mild expiratory wheezing. There is no longer accessory muscle use. On room air the patient has sats of 88-89%, he does not have an oxygen requirement at home thus he will be admitted for observation. Discussed with Dr. Felipa Eth on-call for Dr. [name] who agrees with admission Solu-Medrol given prior to arrival Medications given in emergency department  #1 albuterol  #2 Atrovent  #3 Levaquin      Vida Roller, MD 09/27/12 [phone]

## 2012-09-27 NOTE — ED Notes (Signed)
IV site retapped with paper tape per pt request. Family member at bedside

## 2012-09-27 NOTE — H&P (Signed)
PCP:   Kari Baars, MD   Chief Complaint:  Worsening shortness of breath  HPI: This is an 79 year old Caucasian male who resides independently at [facility] [place] with multiple medical problems including stage IV COPD followed by pulmonology, coronary artery disease with atrial fibrillation with pacemaker in place, on anticoagulation, but does function fairly independently but does not need oxygen. Uses nebulizer treatments on a regular basis. However patient noted worsening issues with upper respiratory tract infection with clear to white discharge, coming from his nose as well as productive cough, worsening wheezing over the last 3-4 days but especially over the last 24 hours, accompanied by sore throat. Patient woke up this morning with significant work of breathing, EMS called, placed on BiPAP initially, received Solu-Medrol as well as 2 nebulizer treatments in route to the emergency room with some significant improvement. Per emergency room physician, patient speaking in full sentences. At the time denied any chest pain, palpitations, nausea, vomiting, pain, change in bowel habits, blood in stool or urine, lower extremity edema, new rashes, headache but did admit to nasal congestion, sore throat and worsening dyspnea.  Review of Systems:  See above as in the history of present illness however again positive for dyspnea on exertion, dyspnea, productive cough with clear to white sputum, nasal discharge, sore throat but negative for chest pain, palpitations, nausea, vomiting, blood in stool or urine, bleeding complications while on anticoagulation. Denies focal neurological deficits, ambulates freely at baseline, but does have respiratory difficulty requiring nebulizer treatments on regular basis  Past Medical History: Past Medical History  Diagnosis Date  . Pneumonia 12/06/10    healthcare-associated/ left lower lobe  . COPD (chronic obstructive pulmonary disease)     severe stage IV  .  Atrial fibrillation     on Coumadin with St. Jude single-chamber pacemaker  . Diabetes mellitus     type 2; s/p Prednisone therapy for pneumonia 12/2010  . Coronary artery disease     s/p anterior STEMI 09/2009; cath. revealed mid LAD 40%, distal LAD 100%- PTCA, prox. RCA 40%, mid. RCA 100% with good collateral filling of PDA; EF 25%; NSTEMI 07/2011 - PCI/DES 100% LCX  . ST elevation (STEMI) myocardial infarction 01//11/12    anterior  . CHF (congestive heart failure)     EF 25% on 09/26/10; EF 50-55% and grade 1 diastolic dysfunction on ECHO 08/2010   . Hypertension   . Hyperlipidemia   . AAA (abdominal aortic aneurysm)     3 cm infrarenal abdominal aortic aneurysm per aorta ultrasound 03/2010  . Osteoarthritis     s/p bilat hip arthroplasty  . Angina   . Ischemic cardiomyopathy     EF 35% LHC 11/12  . GERD (gastroesophageal reflux disease)   . Hypercholesteremia   . PVD (peripheral vascular disease)   . Anxiety    Past Surgical History  Procedure Date  . Hip arthroplasty     s/p right 09/27/10 and s/p left 09/24/10  . Back surgery   . Knee surgery   . Pacemaker insertion 12/2010    St. Jude single-chamber   . Cardiac catheterization 09/2009, 06/2007    PTCA to distal LAD    Medications: Prior to Admission medications   Medication Sig Start Date End Date Taking? Authorizing Provider  amiodarone (PACERONE) 200 MG tablet Take 1 tablet (200 mg total) by mouth daily. 04/14/12 04/14/13 Yes W Buren Kos, MD  beclomethasone (QVAR) 80 MCG/ACT inhaler Inhale 1 puff into the lungs 2 (two) times daily.  Yes Historical Provider, MD  bisoprolol (ZEBETA) 10 MG tablet Take 1 tablet (10 mg total) by mouth daily. 03/19/12 03/19/13 Yes Brooke O Edmisten, PA-C  cholecalciferol (VITAMIN D) 1000 UNITS tablet Take 2,000 Units by mouth daily.   Yes Historical Provider, MD  dabigatran (PRADAXA) 150 MG CAPS Take 150 mg by mouth every 12 (twelve) hours.   Yes Historical Provider, MD  digoxin (LANOXIN)  0.125 MG tablet Take 0.125 mg by mouth daily.   Yes Historical Provider, MD  diltiazem (CARDIZEM CD) 360 MG 24 hr capsule Take 1 capsule (360 mg total) by mouth daily. 03/19/12 03/19/13 Yes Brooke O Edmisten, PA-C  finasteride (PROSCAR) 5 MG tablet Take 5 mg by mouth daily.   Yes Historical Provider, MD  furosemide (LASIX) 20 MG tablet Take 20 mg by mouth daily. TAKE one daily 06/15/12  Yes Marinus Maw, MD  guaiFENesin (MUCINEX) 600 MG 12 hr tablet Take 1,200 mg by mouth 2 (two) times daily.   Yes Historical Provider, MD  ipratropium-albuterol (DUONEB) 0.5-2.5 (3) MG/3ML SOLN Take 3 mLs by nebulization every 6 (six) hours.   Yes Historical Provider, MD  isosorbide mononitrate (IMDUR) 60 MG 24 hr tablet Take 60 mg by mouth daily.     Yes Historical Provider, MD  nitroGLYCERIN (NITROSTAT) 0.4 MG SL tablet Place 0.4 mg under the tongue every 5 (five) minutes as needed. For chest pain   Yes Historical Provider, MD  predniSONE (DELTASONE) 5 MG tablet Take 5 mg by mouth daily.    Yes Historical Provider, MD  rosuvastatin (CRESTOR) 20 MG tablet Take 20 mg by mouth daily.   Yes Historical Provider, MD  sennosides-docusate sodium (SENOKOT-S) 8.6-50 MG tablet Take 2 tablets by mouth at bedtime.    Yes Historical Provider, MD  Tamsulosin HCl (FLOMAX) 0.4 MG CAPS Take 0.4 mg by mouth daily.   Yes Historical Provider, MD  feeding supplement (GLUCERNA SHAKE) LIQD Take 237 mLs by mouth 2 (two) times daily between meals. 04/14/12   Kari Baars, MD  zolpidem (AMBIEN) 5 MG tablet Take 1 tablet (5 mg total) by mouth at bedtime as needed for sleep (insomnia). 04/14/12 04/14/13  Kari Baars, MD    Allergies:  No Known Allergies  Social History:  reports that he quit smoking about 3 years ago. His smoking use included Cigarettes. He has a 100 pack-year smoking history. He has never used smokeless tobacco. He reports that he does not drink alcohol or use illicit drugs. Widowed, 2 children, daughter local, lives in  [place] [facility]  Family History: Family History  Problem Relation Age of Onset  . Heart attack Mother 75  . Heart attack Father 52  . Cancer Other     siblings  . Heart attack Other     Physical Exam: Filed Vitals:   09/27/12 0802 09/27/12 0830 09/27/12 0845 09/27/12 0915  BP: 124/46 121/43 111/95 122/48  Pulse: 59 56 66 54  Temp:      Resp: 20 20 24 25   SpO2: 97% 98% 96% 96%    General appearance, status post for nebulizer treatments administration of Solu-Medrol, no apparent distress talking in full sentences, able to mobilize himself in the room, standing on a scale. Answering questions appropriately. Head normocephalic atraumatic, no oropharyngeal lesions, no significant nasal congestion at the time, no facial rashes, sclera anicteric, extraocular movements intact, no bruits auscultated Lungs reveal diffuse but mild expiratory wheezing with no respiratory distress and no accessory muscle usage Cardiovascular reveals a  regular rate and rhythm by auscultation, pacemaker pocket intact was without any evidence of erythema, no murmurs perceived Abdomen soft, nontender, nondistended, bowel sounds present No peripheral edema, pedal pulses are intact but somewhat diminished, no cyanosis seen, Patient has full range of motion in all 4 extremities, tone and reflexes are symmetric along with strength No rashes noted X line alert and oriented x3 with no focal neurologic deficits.    Labs on Admission:   [facility] 09/27/12 0551  NA 140  K 4.5  CL 101  CO2 --  GLUCOSE 164*  BUN 15  CREATININE 1.00  CALCIUM --  MG --  PHOS --   No results found for this basename: AST:2,ALT:2,ALKPHOS:2,BILITOT:2,PROT:2,ALBUMIN:2 in the last 72 hours No results found for this basename: LIPASE:2,AMYLASE:2 in the last 72 hours  Basename 09/27/12 0551 09/27/12 0534  WBC -- 9.7  NEUTROABS -- 8.0*  HGB 12.9* 12.5*  HCT 38.0* 40.4  MCV -- 91.0  PLT -- 132*   No results found  for this basename: CKTOTAL:3,CKMB:3,CKMBINDEX:3,TROPONINI:3 in the last 72 hours No results found for this basename: TSH,T4TOTAL,FREET3,T3FREE,THYROIDAB in the last 72 hours No results found for this basename: VITAMINB12:2,FOLATE:2,FERRITIN:2,TIBC:2,IRON:2,RETICCTPCT:2 in the last 72 hours  Troponin I 0.02, digoxin level 0.8 Radiological Exams on Admission: Dg Chest Port 1 View  09/27/2012  *RADIOLOGY REPORT*  Clinical Data: Chest pain, shortness of breath.  PORTABLE CHEST - 1 VIEW  Comparison: 04/20/2012  Findings: Left pacer remains in place, unchanged. There is hyperinflation of the lungs compatible with COPD.  Mild cardiomegaly.  No focal airspace opacity.  Scarring in the lung bases.  Small right pleural effusion versus pleural thickening, stable.  IMPRESSION: COPD/chronic changes.  Mild cardiomegaly.  Small right pleural effusion versus pleural thickening, stable.   Original Report Authenticated By: Charlett Nose, M.D.    Orders placed during the hospital encounter of 09/27/12  . ED EKG  . ED EKG   EKG with presumed paced rhythm but no P waves consistent with atrial fibrillation, intraventricular conduction delay  Assessment/Plan COPD flare-patient with stage IV COPD, chronic and progressive, last note from pulmonology reviewed, much improved after administration of nebulizer treatments and Solu-Medrol, appears stable with no significant respiratory distress, will continue current measures along with empiric antibiotics, monitor hopefully patient will continue to improve and be ready for quick discharge.   Corner he artery disease with history of congestive heart failure-hemodynamically stable, with no evidence of volume overload on physical exam with chronic pleural effusion, will continue current home medications and supportive therapy to Atrial fibrillation with pacemaker hemodynamically stable on anticoagulation, CBC stable History of type 2 diabetes now on steroids high dose we'll order  sliding scale insulin given high-dose Solu-Medrol and monitor for appropriate changes. Hyperlipidemia we'll continue home medication regimen Hypertension, blood pressure controlled, will continue current medications Anticoagulation we'll continue home medication.  Whisper Kurka R 09/27/2012, 10:32 AM

## 2012-09-27 NOTE — ED Notes (Signed)
Per EMS- Patient from independent living facility. Pt states he woke up this morning with a raw throat. States he was having difficulty breathing, states he had to use his inhaler sever times last night without relief. As the night progressed his SOB became worse. Absent breath sounds in the Apex of the lungs, L bottom lobe wheezing, R bottom lobe sounded tight. Patient received 5mg  of albuterol in route which produced a lot of anxiety. Patient was at 100%. Pt placed on CPAP. Wheezing resolved. 125mg  Solumedrol and 2 NTG. BP 167/113 HR 67. Pt has pacemaker.

## 2012-09-28 DIAGNOSIS — I5042 Chronic combined systolic (congestive) and diastolic (congestive) heart failure: Secondary | ICD-10-CM | POA: Diagnosis present

## 2012-09-28 LAB — COMPREHENSIVE METABOLIC PANEL
BUN: 21 mg/dL (ref 6–23)
CO2: 27 mEq/L (ref 19–32)
Calcium: 8.7 mg/dL (ref 8.4–10.5)
Creatinine, Ser: 0.77 mg/dL (ref 0.50–1.35)
GFR calc Af Amer: >90 mL/min (ref >90)
GFR calc non Af Amer: 82 mL/min — ABNORMAL LOW (ref >90)
Glucose, Bld: 226 mg/dL — ABNORMAL HIGH (ref 70–99)
Total Protein: 5.8 g/dL — ABNORMAL LOW (ref 6.0–8.3)

## 2012-09-28 LAB — MRSA PCR SCREENING: MRSA by PCR: POSITIVE — AB

## 2012-09-28 LAB — GLUCOSE, CAPILLARY
Glucose-Capillary: 163 mg/dL — ABNORMAL HIGH (ref 70–99)
Glucose-Capillary: 173 mg/dL — ABNORMAL HIGH (ref 70–99)

## 2012-09-28 LAB — CBC
HCT: 37.2 % — ABNORMAL LOW (ref 39.0–52.0)
Hemoglobin: 11.7 g/dL — ABNORMAL LOW (ref 13.0–17.0)
WBC: 11.3 10*3/uL — ABNORMAL HIGH (ref 4.0–10.5)

## 2012-09-28 LAB — PROTIME-INR
INR: 2.03 — ABNORMAL HIGH (ref 0.00–1.49)
Prothrombin Time: 22.1 seconds — ABNORMAL HIGH (ref 11.6–15.2)

## 2012-09-28 MED ORDER — IPRATROPIUM BROMIDE 0.02 % IN SOLN
0.5000 mg | RESPIRATORY_TRACT | Status: DC
  Administered 2012-09-28 – 2012-10-01 (×17): 0.5 mg via RESPIRATORY_TRACT
  Filled 2012-09-28 (×15): qty 2.5

## 2012-09-28 MED ORDER — INSULIN GLARGINE 100 UNIT/ML ~~LOC~~ SOLN
10.0000 [IU] | Freq: Every day | SUBCUTANEOUS | Status: DC
  Administered 2012-09-28 – 2012-10-01 (×4): 10 [IU] via SUBCUTANEOUS

## 2012-09-28 MED ORDER — LEVOFLOXACIN 500 MG PO TABS
500.0000 mg | ORAL_TABLET | Freq: Every day | ORAL | Status: DC
  Administered 2012-09-29 – 2012-10-01 (×3): 500 mg via ORAL
  Filled 2012-09-28 (×3): qty 1

## 2012-09-28 MED ORDER — LEVALBUTEROL HCL 0.63 MG/3ML IN NEBU
0.6300 mg | INHALATION_SOLUTION | RESPIRATORY_TRACT | Status: DC
  Administered 2012-09-29 – 2012-10-01 (×15): 0.63 mg via RESPIRATORY_TRACT
  Filled 2012-09-28 (×26): qty 3

## 2012-09-28 MED ORDER — PREDNISONE 20 MG PO TABS
40.0000 mg | ORAL_TABLET | Freq: Every day | ORAL | Status: DC
  Administered 2012-09-28 – 2012-10-01 (×4): 40 mg via ORAL
  Filled 2012-09-28 (×5): qty 2

## 2012-09-28 NOTE — Progress Notes (Signed)
Utilization Review Completed.   Wave Calzada, RN, BSN Nurse Case Manager  [phone]  

## 2012-09-28 NOTE — Progress Notes (Signed)
Subjective: Feels better, still wheezing a little with cough with minimally productive sputum.  Nebulizers are helpful.  No chest pain.    Objective: Vital signs in last 24 hours: Temp:  [97.3 F (36.3 C)-99.4 F (37.4 C)] 97.9 F (36.6 C) (01/13 0609) Pulse Rate:  [54-66] 57  (01/13 0609) Resp:  [18-26] 18  (01/13 0609) BP: (111-139)/(43-95) 117/60 mmHg (01/13 0609) SpO2:  [96 %-100 %] 97 % (01/13 0609) Weight:  [79.47 kg (175 lb 3.2 oz)-79.788 kg (175 lb 14.4 oz)] 79.47 kg (175 lb 3.2 oz) (01/13 0609) Weight change:  Last BM Date: 09/25/12  CBG (last 3)   Basename 09/27/12 1600 09/27/12 1138  GLUCAP 334* 206*    Intake/Output from previous day: 01/12 0701 - 01/13 0700 In: 240 [P.O.:240] Out: 1450 [Urine:1450] Intake/Output this shift:    General appearance: alert and mild tachypnea, speaking in full sentences, lying flat Eyes: no scleral icterus Throat: oropharynx moist without erythema Resp: bilateral expiratory wheezing throughout Cardio: regular rate and rhythm GI: soft, non-tender; bowel sounds normal; no masses,  no organomegaly Extremities: no clubbing, cyanosis or edema   Lab Results:  [facility] 09/27/12 0551  NA 140  K 4.5  CL 101  CO2 --  GLUCOSE 164*  BUN 15  CREATININE 1.00  CALCIUM --  MG --  PHOS --     Basename 09/27/12 0551 09/27/12 0534  WBC -- 9.7  NEUTROABS -- 8.0*  HGB 12.9* 12.5*  HCT 38.0* 40.4  MCV -- 91.0  PLT -- 132*   Lab Results  Component Value Date   INR 2.43* 09/27/2012   INR 2.39* 04/14/2012   INR 1.86* 04/13/2012   Digoxin level 0.8  Studies/Results: Dg Chest Port 1 View  09/27/2012  *RADIOLOGY REPORT*  Clinical Data: Chest pain, shortness of breath.  PORTABLE CHEST - 1 VIEW  Comparison: 04/20/2012  Findings: Left pacer remains in place, unchanged. There is hyperinflation of the lungs compatible with COPD.  Mild cardiomegaly.  No focal airspace opacity.  Scarring in the lung bases.  Small right pleural effusion  versus pleural thickening, stable.  IMPRESSION: COPD/chronic changes.  Mild cardiomegaly.  Small right pleural effusion versus pleural thickening, stable.   Original Report Authenticated By: Charlett Nose, M.D.      Medications: Scheduled:   . albuterol  2.5 mg Nebulization Q6H  . amiodarone  200 mg Oral Daily  . atorvastatin  10 mg Oral q1800  . bisoprolol  10 mg Oral Daily  . cholecalciferol  2,000 Units Oral Daily  . dabigatran  150 mg Oral Q12H  . digoxin  0.125 mg Oral Daily  . diltiazem  360 mg Oral Daily  . finasteride  5 mg Oral Daily  . furosemide  20 mg Oral Daily  . guaiFENesin  1,200 mg Oral BID  . insulin aspart  0-5 Units Subcutaneous QHS  . insulin aspart  0-9 Units Subcutaneous TID WC  . ipratropium  0.5 mg Nebulization Q6H  . isosorbide mononitrate  60 mg Oral Daily  . levofloxacin (LEVAQUIN) IV  500 mg Intravenous Q24H  . methylPREDNISolone (SOLU-MEDROL) injection  80 mg Intravenous Q12H  . sennosides-docusate sodium  2 tablet Oral QHS  . sodium chloride  3 mL Intravenous Q12H  . Tamsulosin HCl  0.4 mg Oral Daily   Continuous:   . sodium chloride 50 mL/hr at 09/27/12 1851    Assessment/Plan: Principal Problem: 1. *Acute-on-chronic respiratory failure secondary to COPD exacerbation- improving with steroids, antibiotics and bronchodilators.  Wean O2  to off as tolerated. Transition to po steroids and antibiotics.  Pulmonary status remains tenuous so will monitor for 24-48 hours with transition to oral treatment and continue scheduled nebulizers.    Active Problems: 2. CAD (coronary artery disease)- continue medical therapy.  No anginal symptoms.  Cardiac enzymes negative on admission.   3. Type 2 diabetes mellitus- steroid induced hyperglycemia.  Will add Lantus 10 units and continue SSI while on high dose steroids.  Has been doing well off treatment at home.   4. Permanent atrial fibrillation- currently paced.  Continue rate control, Amiodarone, and  anticoagulation with Pradaxa. 5. Chronic anticoagulation- continue Pradaxa. 6. Chronic combined systolic and diastolic congestive heart failure (EF 40-45%)- appears well compensated at this point.  Saline lock IV. 7. Disposition- anticipate discharge to [facility] in 1-2 days if stable. 8. Ethics- confirmed DNR status with patient.    LOS: 1 day   [place],[name] 09/28/2012, 7:17 AM

## 2012-09-29 LAB — GLUCOSE, CAPILLARY
Glucose-Capillary: 128 mg/dL — ABNORMAL HIGH (ref 70–99)
Glucose-Capillary: 138 mg/dL — ABNORMAL HIGH (ref 70–99)

## 2012-09-29 MED ORDER — BIOTENE DRY MOUTH MT LIQD
15.0000 mL | Freq: Two times a day (BID) | OROMUCOSAL | Status: DC
  Administered 2012-09-29 – 2012-10-01 (×5): 15 mL via OROMUCOSAL

## 2012-09-29 MED ORDER — LOSARTAN POTASSIUM 25 MG PO TABS
25.0000 mg | ORAL_TABLET | Freq: Every day | ORAL | Status: DC
  Administered 2012-09-29 – 2012-10-01 (×3): 25 mg via ORAL
  Filled 2012-09-29 (×3): qty 1

## 2012-09-29 NOTE — Progress Notes (Signed)
Pt's HR 55 this am 55 notified Dr. [name]  And alsothat pt has digoxin due and if it was ok to give.  Dr. [name] instructed it was lok to give land pacemaker will kick in.  Will continue to monitor.  Amanda Pea, Charity fundraiser.

## 2012-09-29 NOTE — Progress Notes (Signed)
Patient was previously admitted February 2013 to [facility] Care Management services for disease process education.  Patient demonstrated adequate knowledge of dietary and weight management.  Spoke with patient at bedside regarding readmission for services.  He declined services at this time because he feels that he is only weak and will recover well with home health PT.  He will discuss his follow up care with Dr [name] and contact us as needed.  Contact information and [facility] literature were provided.  Of note, [facility] Care Management services does not replace or interfere with any services that are arranged by inpatient case management or social work.  For additional questions or referrals please contact Anibal Henderson BSN RN [facility] Liaison at [phone].

## 2012-09-29 NOTE — Progress Notes (Signed)
Pt  Declined ambulating this am.  States "I'll try to walk later on."  Amanda Pea, Charity fundraiser.

## 2012-09-29 NOTE — Progress Notes (Signed)
PT Cancellation Note  Patient Details Name: [name] MRN: [id number] DOB: 09/21/1927   Cancelled Treatment:    Reason Eval/Treat Not Completed: Medical issues which prohibited therapy. Pt with increased work of breathing and still recovering from trying to sit on the edge of the bed to eat this morning (SaO2 98% on 3 liters). He would like to wait to mobilize until after his breathing treatment this morning. Will check back as time allows. Given how much difficulty he had with just sitting up this morning he may need to consider ST-SNF prior to return to independent living situation.   [facility] [name] 09/29/2012, 8:42 AM Pager: [phone]

## 2012-09-29 NOTE — Progress Notes (Signed)
Subjective: Difficulty breathing yesterday afternoon, better several hours after nebulizer treatment.  Cough with mucous production unchanged.   Objective: Vital signs in last 24 hours: Temp:  [97.4 F (36.3 C)-97.9 F (36.6 C)] 97.4 F (36.3 C) (01/14 0653) Pulse Rate:  [51-66] 51  (01/14 0653) Resp:  [18-24] 18  (01/14 0653) BP: (115-146)/(65-76) 138/65 mmHg (01/14 0653) SpO2:  [96 %-100 %] 99 % (01/14 0653) Weight:  [78.472 kg (173 lb)] 78.472 kg (173 lb) (01/14 0653) Weight change: -1.315 kg (-2 lb 14.4 oz) Last BM Date: 09/25/12  CBG (last 3)   Basename 09/29/12 0620 09/28/12 2047 09/28/12 1626  GLUCAP 128* 261* 173*    Intake/Output from previous day: 01/13 0701 - 01/14 0700 In: 823 [P.O.:820; I.V.:3] Out: 875 [Urine:875] Intake/Output this shift:    General appearance: alert and chronically ill, lying flat Eyes: no scleral icterus Throat: oropharynx moist without erythema Resp: decreased bilateral expiratory wheezing Cardio: distant heart sounds, regular GI: soft, non-tender; bowel sounds normal; no masses,  no organomegaly Extremities: no clubbing, cyanosis or edema   Lab Results:  [facility] 09/28/12 0909 09/27/12 0551  NA 135 140  K 3.9 4.5  CL 97 101  CO2 27 --  GLUCOSE 226* 164*  BUN 21 15  CREATININE 0.77 1.00  CALCIUM 8.7 --  MG -- --  PHOS -- --    Basename 09/28/12 0909  AST 26  ALT 31  ALKPHOS 58  BILITOT 0.4  PROT 5.8*  ALBUMIN 3.1*    Basename 09/28/12 0909 09/27/12 0551 09/27/12 0534  WBC 11.3* -- 9.7  NEUTROABS -- -- 8.0*  HGB 11.7* 12.9* --  HCT 37.2* 38.0* --  MCV 90.5 -- 91.0  PLT 122* -- 132*   Lab Results  Component Value Date   INR 2.03* 09/28/2012   INR 2.43* 09/27/2012   INR 2.39* 04/14/2012   No results found for this basename: CKTOTAL:3,CKMB:3,CKMBINDEX:3,TROPONINI:3 in the last 72 hours No results found for this basename: TSH,T4TOTAL,FREET3,T3FREE,THYROIDAB in the last 72 hours No results found for this  basename: VITAMINB12:2,FOLATE:2,FERRITIN:2,TIBC:2,IRON:2,RETICCTPCT:2 in the last 72 hours  Studies/Results: No results found.   Medications: Scheduled:   . amiodarone  200 mg Oral Daily  . antiseptic oral rinse  15 mL Mouth Rinse BID  . atorvastatin  10 mg Oral q1800  . bisoprolol  10 mg Oral Daily  . cholecalciferol  2,000 Units Oral Daily  . dabigatran  150 mg Oral Q12H  . digoxin  0.125 mg Oral Daily  . diltiazem  360 mg Oral Daily  . finasteride  5 mg Oral Daily  . furosemide  20 mg Oral Daily  . guaiFENesin  1,200 mg Oral BID  . insulin aspart  0-5 Units Subcutaneous QHS  . insulin aspart  0-9 Units Subcutaneous TID WC  . insulin glargine  10 Units Subcutaneous Daily  . ipratropium  0.5 mg Nebulization Q4H  . isosorbide mononitrate  60 mg Oral Daily  . levalbuterol  0.63 mg Nebulization Q4H  . levofloxacin  500 mg Oral Daily  . predniSONE  40 mg Oral Q breakfast  . sennosides-docusate sodium  2 tablet Oral QHS  . sodium chloride  3 mL Intravenous Q12H  . Tamsulosin HCl  0.4 mg Oral Daily   Continuous:   Assessment/Plan: Principal Problem:  1. *Acute-on-chronic respiratory failure secondary to COPD exacerbation- slowly improving with steroids, antibiotics and bronchodilators. Wean O2 to off as tolerated. Pulmonary status remains tenuous so will monitor for additional 24-48 hours with continued scheduled  nebulizers.  Active Problems:  2. CAD (coronary artery disease)- continue medical therapy. No anginal symptoms. Cardiac enzymes negative on admission.  3. Type 2 diabetes mellitus- steroid induced hyperglycemia. Continue Lantus 10 units daily while on steroids and continue SSI. Has been doing well off treatment at home.  4. Permanent atrial fibrillation- currently paced. Continue rate control, Amiodarone, and anticoagulation with Pradaxa.  5. Chronic anticoagulation- continue Pradaxa.  6. Chronic combined systolic and diastolic congestive heart failure (EF 40-45%)-  appears well compensated at this point. Saline lock IV.  Will add ARB (patient unaware of contraindication in the past)- avoid ACE inhibitor due to pulmonary issues..  7. Disposition- anticipate discharge to [facility] in 1-2 days if stable.  PT/OT, OOB. 8. Ethics- DNR    LOS: 2 days   Chanita Boden,[name] 09/29/2012, 7:30 AM

## 2012-09-30 LAB — GLUCOSE, CAPILLARY
Glucose-Capillary: 90 mg/dL (ref 70–99)
Glucose-Capillary: 178 mg/dL — ABNORMAL HIGH (ref 70–99)

## 2012-09-30 LAB — CBC
HCT: 40.4 % (ref 39.0–52.0)
MCHC: 30.9 g/dL (ref 30.0–36.0)
Platelets: 135 10*3/uL — ABNORMAL LOW (ref 150–400)
RDW: 16.8 % — ABNORMAL HIGH (ref 11.5–15.5)
WBC: 9.2 10*3/uL (ref 4.0–10.5)

## 2012-09-30 LAB — BASIC METABOLIC PANEL
Chloride: 100 mEq/L (ref 96–112)
GFR calc Af Amer: >90 mL/min (ref >90)
GFR calc non Af Amer: 82 mL/min — ABNORMAL LOW (ref >90)
Potassium: 4.2 mEq/L (ref 3.5–5.1)
Sodium: 138 mEq/L (ref 135–145)

## 2012-09-30 MED ORDER — LOSARTAN POTASSIUM 25 MG PO TABS: 25.0000 mg | ORAL_TABLET | Freq: Every day | ORAL | Status: DC

## 2012-09-30 MED ORDER — PREDNISONE 5 MG PO TABS: 10.0000 mg | ORAL_TABLET | Freq: Every day | ORAL | Status: DC

## 2012-09-30 MED ORDER — LEVOFLOXACIN 500 MG PO TABS: 500.0000 mg | ORAL_TABLET | Freq: Every day | ORAL | Status: DC

## 2012-09-30 MED ORDER — POLYETHYLENE GLYCOL 3350 17 G PO PACK
17.0000 g | PACK | Freq: Every day | ORAL | Status: DC | PRN
  Administered 2012-09-30: 17 g via ORAL
  Filled 2012-09-30: qty 1

## 2012-09-30 NOTE — Evaluation (Signed)
Physical Therapy Evaluation Patient Details Name: [name] MRN: [id number] DOB: Aug 31, 1928 Today's Date: 09/30/2012 Time: 7829-5621 PT Time Calculation (min): 25 min  PT Assessment / Plan / Recommendation Clinical Impression  79 y/o male admitted for acute COPD exacerbation. Presents to PT with limited activity tolerance affecting his functional independence and safety on d/c. Per pt report he was walking several hundred feet to the dining hall 3x/day but now can barely sit on the edge of the bed without getting out of breath. Given his limited tolerance I question if he would be able to manage ADLs at home independently. Will benefit acute PT to address these impairments and recommend ST-SNF to maximize activity tolerance prior to d/c back to independent living situation. Will follow acutely.     PT Assessment  Patient needs continued PT services    Follow Up Recommendations  SNF    Does the patient have the potential to tolerate intense rehabilitation      Barriers to Discharge Decreased caregiver support lives in independent living    Equipment Recommendations  None recommended by PT    Recommendations for Other Services     Frequency Min 3X/week    Precautions / Restrictions Precautions Precautions: Fall Restrictions Weight Bearing Restrictions: No   Pertinent Vitals/Pain Pt short of breath on arrival, 1-2/4, during ambulation pt's DOE increased to 3/4 progressing quickly to 4 and needing to lie back down to catch his breath; SaO2 maintained greater than 90% on RA during ambulation but as dyspnea increased his sats dropped to mid 80s and needed 2 liters of O2 to recover as well as lying down       Mobility  Bed Mobility Bed Mobility: Supine to Sit;Sit to Supine Supine to Sit: 6: Modified independent (Device/Increase time);HOB elevated (20 degrees) Sit to Supine: 6: Modified independent (Device/Increase time);HOB elevated (20) Details for Bed Mobility Assistance:  just sitting up initially pt working harder to breathe, sat on RA for 2 minutes and his SaO2 dropped to 91%; pt laid back down for breathing treatment and I returned to assess further mobility following breathing treatment Transfers Transfers: Sit to Stand;Stand to Sit Sit to Stand: 4: Min guard;From bed;With upper extremity assist Stand to Sit: 4: Min guard;To bed;With upper extremity assist Details for Transfer Assistance: gaurding for safety, pt gets a bit impulsive especially towards the end of the session because he  Ambulation/Gait Ambulation/Gait Assistance: 4: Min guard Ambulation Distance (Feet): 100 Feet Assistive device: Rolling walker Ambulation/Gait Assistance Details: cues for tall posture and to step into RW, as pt began to fatigue he became more dyspneic and anxious further impairing his breathing (SaO2 maintained at or above 91% during ambulation until the very end of walking and it dropped into the 80s with difficulty recovering until he laid back down and O2 reapplied); pt cued for controlled and pursed lip breathing as well as relaxation techniques to slow respiratory rate and improve quality of breath Gait Pattern: Step-through pattern;Trunk flexed;Decreased stride length General Gait Details: gets more impulsive as he fatigues              PT Diagnosis: Difficulty walking;Abnormality of gait;Generalized weakness  PT Problem List: Decreased activity tolerance;Decreased balance;Cardiopulmonary status limiting activity PT Treatment Interventions: DME instruction;Gait training;Functional mobility training;Therapeutic activities;Therapeutic exercise;Balance training;Patient/family education;Neuromuscular re-education   PT Goals Acute Rehab PT Goals PT Goal Formulation: With patient Time For Goal Achievement: 10/07/12 Potential to Achieve Goals: Good Pt will go Sit to Stand: with modified independence PT Goal:  Sit to Stand - Progress: Goal set today Pt will go Stand to  Sit: with modified independence PT Goal: Stand to Sit - Progress: Goal set today Pt will Ambulate: >150 feet;with modified independence;with least restrictive assistive device (maintaining SaO2 >/=90% on RA and DOE 1/4) PT Goal: Ambulate - Progress: Goal set today Pt will Perform Home Exercise Program: Independently PT Goal: Perform Home Exercise Program - Progress: Goal set today  Visit Information  Last PT Received On: 09/30/12 Assistance Needed: +1    Subjective Data  Subjective: I just don't think I can do it. I need a breathing treatment.    Prior Functioning  Home Living Lives With: Alone Type of Home: Independent living facility Home Access: Level entry Home Layout: One level Bathroom Shower/Tub: Health visitor: Handicapped height Home Adaptive Equipment: Walker - four wheeled;Grab bars around toilet;Grab bars in shower;Built-in shower seat;Shower chair with back Prior Function Level of Independence: Independent with assistive device(s) Driving: No Vocation: Retired Musician: No difficulties Dominant Hand: Right    Cognition  Overall Cognitive Status: Appears within functional limits for tasks assessed/performed Arousal/Alertness: Awake/alert Orientation Level: Appears intact for tasks assessed Behavior During Session: Anxious Cognition - Other Comments: anxious about catching his breath    Extremity/Trunk Assessment Right Upper Extremity Assessment RUE ROM/Strength/Tone:  (see OT note) Left Upper Extremity Assessment LUE ROM/Strength/Tone:  (see OT note) Right Lower Extremity Assessment RLE ROM/Strength/Tone: WFL for tasks assessed Left Lower Extremity Assessment LLE ROM/Strength/Tone: WFL for tasks assessed Trunk Assessment Trunk Assessment: Normal   Balance    End of Session PT - End of Session Equipment Utilized During Treatment: Gait belt Activity Tolerance: Patient limited by fatigue Patient left: in bed;with call  bell/phone within reach Nurse Communication: Mobility status  GP     [facility] [name] 09/30/2012, 12:27 PM

## 2012-09-30 NOTE — Evaluation (Signed)
Occupational Therapy Evaluation Patient Details Name: [name] MRN: [id number] DOB: 1928/07/25 Today's Date: 09/30/2012 Time: 4098-1191 OT Time Calculation (min): 14 min  OT Assessment / Plan / Recommendation Clinical Impression  79 y/o male admitted for acute COPD exacerbation. Presents to OT with limited activity tolerance affecting his functional independence and safety on d/c. Per pt report he was walking several hundred feet to the dining hall 3x/day but now can barely sit on the edge of the bed without getting out of breath. Pt agrees he is unable to manage ADLs in his current state. Pt is agreeable to SNF for d/c plan. Will follow acutely.    OT Assessment  Patient needs continued OT Services    Follow Up Recommendations  SNF    Barriers to Discharge      Equipment Recommendations  None recommended by OT    Recommendations for Other Services    Frequency  Min 2X/week    Precautions / Restrictions Precautions Precautions: Fall Precaution Comments: decreased 02 sats with activity. [place] 02 and rest breaks required Restrictions Weight Bearing Restrictions: No   Pertinent Vitals/Pain No c/o pain.  Pt short of breath on arrival, 1-2/4, during ambulation pt's DOE increased to 3/4 progressing quickly to 4 and needing to lie back down to catch his breath; SaO2 maintained greater than 90% on RA during ambulation but as dyspnea increased his sats dropped to mid 80s and needed 2 liters of O2 to recover as well as lying down     ADL  Grooming: Wash/dry hands;Wash/dry face;Min guard Where Assessed - Grooming: Supported standing (UE support on sink) Upper Body Bathing: Set up;Supervision/safety Where Assessed - Upper Body Bathing: Unsupported sitting Lower Body Bathing: Minimal assistance Where Assessed - Lower Body Bathing: Unsupported sit to stand Upper Body Dressing: Supervision/safety;Set up Where Assessed - Upper Body Dressing: Unsupported sitting Lower Body Dressing:  Minimal assistance Where Assessed - Lower Body Dressing: Unsupported sit to stand Toilet Transfer: Min guard Toilet Transfer Method: Sit to Barista: Regular height toilet;Grab bars Toileting - Clothing Manipulation and Hygiene: Minimal assistance Where Assessed - Engineer, mining and Hygiene: Sit to stand from 3-in-1 or toilet Equipment Used: Gait belt;Rolling walker Transfers/Ambulation Related to ADLs: Min guard A with RW ambulation. Pt easily fatigued. O2 remained above 90 for ambulation but quickly dropped into 80's upon stopping. Pt required greater than for recovery with [place] 02 back into the 90's    OT Diagnosis: Generalized weakness  OT Problem List: Decreased activity tolerance;Decreased knowledge of use of DME or AE;Cardiopulmonary status limiting activity OT Treatment Interventions: Self-care/ADL training;Therapeutic activities;Balance training;Patient/family education;DME and/or AE instruction;Energy conservation   OT Goals Acute Rehab OT Goals OT Goal Formulation: With patient Time For Goal Achievement: 10/14/12 Potential to Achieve Goals: Good ADL Goals Pt Will Perform Grooming: Independently;Standing at sink;Sitting at sink ADL Goal: Grooming - Progress: Goal set today Pt Will Perform Lower Body Dressing: Independently;Sit to stand from bed;Sit to stand from chair ADL Goal: Lower Body Dressing - Progress: Goal set today Pt Will Transfer to Toilet: with modified independence;Ambulation;with DME ADL Goal: Toilet Transfer - Progress: Goal set today Pt Will Perform Toileting - Clothing Manipulation: Independently;Standing;Sitting on 3-in-1 or toilet ADL Goal: Toileting - Clothing Manipulation - Progress: Goal set today Additional ADL Goal #1: Pt will tolerate therapeutic activity with no more than 4 independently initiated and terminated rest breaks.  ADL Goal: Additional Goal #1 - Progress: Goal set today  Visit Information  Last OT Received On: 09/30/12 Assistance Needed: +1    Subjective Data  Subjective: "I know I can't go home like this." Patient Stated Goal: Return home.    Prior Functioning     Home Living Lives With: Alone Type of Home: Independent living facility Home Access: Level entry Home Layout: One level Bathroom Shower/Tub: Health visitor: Handicapped height Home Adaptive Equipment: Walker - four wheeled;Grab bars around toilet;Grab bars in shower;Built-in shower seat;Shower chair with back Prior Function Level of Independence: Independent with assistive device(s) Driving: No Vocation: Retired Musician: No difficulties Dominant Hand: Right         Vision/Perception     Cognition  Overall Cognitive Status: Appears within functional limits for tasks assessed/performed Arousal/Alertness: Awake/alert Orientation Level: Appears intact for tasks assessed Behavior During Session: Anxious Cognition - Other Comments: anxious about catching his breath    Extremity/Trunk Assessment Right Upper Extremity Assessment RUE ROM/Strength/Tone: WFL for tasks assessed Left Upper Extremity Assessment LUE ROM/Strength/Tone: WFL for tasks assessed Right Lower Extremity Assessment RLE ROM/Strength/Tone: WFL for tasks assessed Left Lower Extremity Assessment LLE ROM/Strength/Tone: WFL for tasks assessed Trunk Assessment Trunk Assessment: Normal     Mobility Bed Mobility Bed Mobility: Supine to Sit;Sit to Supine Supine to Sit: 6: Modified independent (Device/Increase time);HOB elevated Sit to Supine: 6: Modified independent (Device/Increase time);HOB elevated Details for Bed Mobility Assistance: just sitting up initially pt working harder to breathe, sat on RA for 2 minutes and his SaO2 dropped to 91%; pt laid back down for breathing treatment and I returned to assess further mobility following breathing treatment Transfers Sit to Stand: 4: Min guard;From  bed;With upper extremity assist Stand to Sit: 4: Min guard;To bed;With upper extremity assist Details for Transfer Assistance: guarding for safety, pt gets a bit impulsive especially towards the end of the session because he      Shoulder Instructions     Exercise     Balance     End of Session OT - End of Session Equipment Utilized During Treatment: Gait belt Activity Tolerance:  (limited by low O2 sats/fatigue) Patient left: with bed alarm set Nurse Communication: Mobility status  GO     Marsa Matteo 09/30/2012, 3:13 PM

## 2012-09-30 NOTE — Discharge Summary (Signed)
DISCHARGE SUMMARY  [name]  MR#: [id number]  DOB:September 23, 1927  Date of Admission: 09/27/2012 Date of Discharge: 09/30/2012  Attending Physician:Audrianna Driskill,W Darin  Patient's [name]:[name],[name], MD  Consults: None indicated  Discharge Diagnoses: Principal Problem:  *Acute-on-chronic respiratory failure secondary to COPD exacerbation Active Problems:  CAD (coronary artery disease)  Type 2 diabetes mellitus  Permanent atrial fibrillation  Chronic anticoagulation  Chronic combined systolic and diastolic congestive heart failure  Past Medical History  Diagnosis Date  . Pneumonia 12/06/10    healthcare-associated/ left lower lobe  . COPD (chronic obstructive pulmonary disease)     severe stage IV  . Atrial fibrillation     on Coumadin with St. Jude single-chamber pacemaker  . Diabetes mellitus     type 2; s/p Prednisone therapy for pneumonia 12/2010  . Coronary artery disease     s/p anterior STEMI 09/2009; cath. revealed mid LAD 40%, distal LAD 100%- PTCA, prox. RCA 40%, mid. RCA 100% with good collateral filling of PDA; EF 25%; NSTEMI 07/2011 - PCI/DES 100% LCX  . ST elevation (STEMI) myocardial infarction 01//11/12    anterior  . CHF (congestive heart failure)     EF 25% on 09/26/10; EF 50-55% and grade 1 diastolic dysfunction on ECHO 08/2010   . Hypertension   . Hyperlipidemia   . AAA (abdominal aortic aneurysm)     3 cm infrarenal abdominal aortic aneurysm per aorta ultrasound 03/2010  . Osteoarthritis     s/p bilat hip arthroplasty  . Angina   . Ischemic cardiomyopathy     EF 35% LHC 11/12  . GERD (gastroesophageal reflux disease)   . Hypercholesteremia   . PVD (peripheral vascular disease)   . Anxiety    Past Surgical History  Procedure Date  . Hip arthroplasty     s/p right 09/27/10 and s/p left 09/24/10  . Back surgery   . Knee surgery   . Pacemaker insertion 12/2010    St. Jude single-chamber   . Cardiac catheterization 09/2009, 06/2007    PTCA to  distal LAD    Discharge Medications:   Medication List     As of 09/30/2012  7:59 AM    STOP taking these medications         ARTIFICIAL TEAR OP      TAKE these medications         amiodarone 200 MG tablet   Commonly known as: PACERONE   Take 1 tablet (200 mg total) by mouth daily.      beclomethasone 80 MCG/ACT inhaler   Commonly known as: QVAR   Inhale 1 puff into the lungs 2 (two) times daily.      bisoprolol 10 MG tablet   Commonly known as: ZEBETA   Take 1 tablet (10 mg total) by mouth daily.      cholecalciferol 1000 UNITS tablet   Commonly known as: VITAMIN D   Take 2,000 Units by mouth daily.      dabigatran 150 MG Caps   Commonly known as: PRADAXA   Take 150 mg by mouth every 12 (twelve) hours.      digoxin 0.125 MG tablet   Commonly known as: LANOXIN   Take 0.125 mg by mouth daily.      diltiazem 360 MG 24 hr capsule   Commonly known as: CARDIZEM CD   Take 1 capsule (360 mg total) by mouth daily.      feeding supplement Liqd   Take 237 mLs by mouth 2 (two) times daily  between meals.      finasteride 5 MG tablet   Commonly known as: PROSCAR   Take 5 mg by mouth daily.      furosemide 20 MG tablet   Commonly known as: LASIX   Take 20 mg by mouth daily. TAKE one daily      guaiFENesin 600 MG 12 hr tablet   Commonly known as: MUCINEX   Take 1,200 mg by mouth 2 (two) times daily.      ipratropium-albuterol 0.5-2.5 (3) MG/3ML Soln   Commonly known as: DUONEB   Take 3 mLs by nebulization every 6 (six) hours.      isosorbide mononitrate 60 MG 24 hr tablet   Commonly known as: IMDUR   Take 60 mg by mouth daily.      levofloxacin 500 MG tablet   Commonly known as: LEVAQUIN   Take 1 tablet (500 mg total) by mouth daily.      losartan 25 MG tablet   Commonly known as: COZAAR   Take 1 tablet (25 mg total) by mouth daily.      nitroGLYCERIN 0.4 MG SL tablet   Commonly known as: NITROSTAT   Place 0.4 mg under the tongue every 5 (five) minutes as  needed. For chest pain      predniSONE 5 MG tablet   Commonly known as: DELTASONE   Take 2 tablets (10 mg total) by mouth daily. 4 pills daily X 3 days, then 2 pills daily X 3 days, then 1 pill daily X 3 days, then resume 5mg  daily      rosuvastatin 20 MG tablet   Commonly known as: CRESTOR   Take 20 mg by mouth daily.      sennosides-docusate sodium 8.6-50 MG tablet   Commonly known as: SENOKOT-S   Take 2 tablets by mouth at bedtime.      Tamsulosin HCl 0.4 MG Caps   Commonly known as: FLOMAX   Take 0.4 mg by mouth daily.      zolpidem 5 MG tablet   Commonly known as: AMBIEN   Take 1 tablet (5 mg total) by mouth at bedtime as needed for sleep (insomnia).        Hospital Procedures: Dg Chest Port 1 View  09/27/2012  *RADIOLOGY REPORT*  Clinical Data: Chest pain, shortness of breath.  PORTABLE CHEST - 1 VIEW  Comparison: 04/20/2012  Findings: Left pacer remains in place, unchanged. There is hyperinflation of the lungs compatible with COPD.  Mild cardiomegaly.  No focal airspace opacity.  Scarring in the lung bases.  Small right pleural effusion versus pleural thickening, stable.  IMPRESSION: COPD/chronic changes.  Mild cardiomegaly.  Small right pleural effusion versus pleural thickening, stable.   Original Report Authenticated By: Charlett Nose, M.D.     History of Present Illness- (From Dr. Vicente [name] H&P): This is an 79 year old Caucasian male who resides independently at [facility] [place] with multiple medical problems including stage IV COPD followed by pulmonology, coronary artery disease with atrial fibrillation with pacemaker in place, on anticoagulation, but does function fairly independently but does not need oxygen. Uses nebulizer treatments on a regular basis. However patient noted worsening issues with upper respiratory tract infection with clear to white discharge, coming from his nose as well as productive cough, worsening wheezing over the last 3-4 days but especially over the  last 24 hours, accompanied by sore throat. Patient woke up this morning with significant work of breathing, EMS called, placed on BiPAP initially, received Solu-Medrol as well  as 2 nebulizer treatments in route to the emergency room with some significant improvement. Per emergency room physician, patient speaking in full sentences. At the time denied any chest pain, palpitations, nausea, vomiting, pain, change in bowel habits, blood in stool or urine, lower extremity edema, new rashes, headache but did admit to nasal congestion, sore throat and worsening dyspnea.   Hospital Course: Mr. Gladson was admitted to a telemetry bed. He was placed on IV steroids (Solu-Medrol), IV antibiotics (Levaquin), and scheduled bronchodilators. With this, his respiratory status slowly improved. He was transitioned to oral steroids and antibiotics two days prior to discharge with continued improvement. He was able to ambulate to the restroom with some dyspnea but felt like he was stable for discharge home. Of note, he does have chronic severe COPD and is steroid dependent. He takes 5 mg daily at home. He has not been on oxygen in the past. Given the severity of his COPD, home oxygen will be arranged prior to discharge.  In addition, physical therapy, occupational therapy, and nursing care will be provided at home for his deconditioning and chronic COPD disease state management. I also advised him to utilize [facility] resources.  Of note, his blood sugars were significantly elevated with IV steroids. As he was transitioned to oral steroids his sugar improved. He was placed on Lantus briefly during this hospitalization but given his excellent control at home this will be discontinued prior to discharge. For his chronic congestive heart failure, losartan was added. He seemed to tolerate this well with blood pressures running in the 120s. He had no evidence of volume overload or congestive heart failure symptoms  during this hospitalization.  Day of Discharge Exam BP 114/58  Pulse 62  Temp 97.6 F (36.4 C) (Oral)  Resp 22  Ht 5\' 9"  (1.753 m)  Wt 77.5 kg (170 lb 13.7 oz)  BMI 25.23 kg/m2  SpO2 100%  Physical Exam: General appearance: chronically ill, mild dyspnea with movement Eyes: no scleral icterus Throat: oropharynx moist without erythema Resp: minimal expiratory wheezing, much improved Cardio: distant heart sounds, regular GI: soft, non-tender; bowel sounds normal; no masses,  no organomegaly Extremities: no clubbing, cyanosis or edema  Discharge Labs:  [facility] 09/30/12 0550 09/28/12 0909  NA 138 135  K 4.2 3.9  CL 100 97  CO2 33* 27  GLUCOSE 97 226*  BUN 22 21  CREATININE 0.78 0.77  CALCIUM 8.7 8.7  MG -- --  PHOS -- --    Basename 09/28/12 0909  AST 26  ALT 31  ALKPHOS 58  BILITOT 0.4  PROT 5.8*  ALBUMIN 3.1*    Basename 09/28/12 0909  WBC 11.3*  NEUTROABS --  HGB 11.7*  HCT 37.2*  MCV 90.5  PLT 122*   Lab Results  Component Value Date   INR 2.03* 09/28/2012   INR 2.43* 09/27/2012   INR 2.39* 04/14/2012     Discharge instructions:     Discharge Orders    Future Appointments: Provider: Department: Dept Phone: Center:   11/20/2012 1:30 PM Cassell Clement, MD [facility] Main Office [place]) [phone] [facility]     Future Orders Please Complete By Expires   For home use only DME oxygen      Comments:   Dx: Severe COPD   Questions: Responses:   Mode or (Route) Nasal cannula   Liters per Minute 2   Frequency Continuous   Oxygen conserving device    Home Health      Questions: Responses:  To provide the following care/treatments PT    OT    RN   Face-to-face encounter      Comments:   I Dru Primeau,W Darin certify that this patient is under my care and that I, or a nurse practitioner or physician's assistant working with me, had a face-to-face encounter that meets the physician face-to-face encounter requirements with this patient on  09/30/2012. The encounter with the patient was in whole, or in part for the following medical condition(s) which is the primary reason for home health care (List medical condition): COPD exacerbation, deconditioning   Questions: Responses:   The encounter with the patient was in whole, or in part, for the following medical condition, which is the primary reason for home health care COPD exacerbation   I certify that, based on my findings, the following services are medically necessary home health services Physical therapy   My clinical findings support the need for the above services Unable to leave home safely without assistance and/or assistive device   Further, I certify that my clinical findings support that this patient is homebound due to: Unable to leave home safely without assistance   To provide the following care/treatments PT    OT    RN      Disposition: to home  Follow-up Appts: Follow-up with Dr. [name] at [facility] in 1 week.  Condition on Discharge: Improved, stable.  Tests Needing Follow-up: None  Time with discharge activities: 40 minutes  CODE STATUS: DNR  Signed: Jowanda Heeg,W Darin 09/30/2012, 7:59 AM

## 2012-10-01 LAB — GLUCOSE, CAPILLARY: Glucose-Capillary: 81 mg/dL (ref 70–99)

## 2012-10-01 MED ORDER — INSULIN ASPART 100 UNIT/ML ~~LOC~~ SOLN: 0.0000 [IU] | Freq: Every day | SUBCUTANEOUS | Status: DC

## 2012-10-01 MED ORDER — INSULIN ASPART 100 UNIT/ML ~~LOC~~ SOLN: 0.0000 [IU] | Freq: Three times a day (TID) | SUBCUTANEOUS | Status: DC

## 2012-10-01 MED ORDER — BISACODYL 10 MG RE SUPP: 10.0000 mg | Freq: Every day | RECTAL | Status: DC | PRN

## 2012-10-01 MED ORDER — POLYETHYLENE GLYCOL 3350 17 G PO PACK: 17.0000 g | PACK | Freq: Every day | ORAL | Status: DC | PRN

## 2012-10-01 NOTE — Progress Notes (Signed)
Resident of [facility].  Met with patient and his daughter [name]. He plans to return to his independent living arrangement at [facility] and will accept home health if indicated per MD.  Discussed with RNCM. Patient has resided in the past at both [facility] and [facility] for rehab.  No CSW needs identified.  CSW signing off.  Lorri Frederick. [place]  [phone]

## 2012-10-01 NOTE — Clinical Social Work Placement (Addendum)
    Clinical Social Work Department CLINICAL SOCIAL WORK PLACEMENT NOTE 10/01/2012  Patient:  [name], [name]  Account Number:  [id number] Admit date:  09/27/2012  Clinical Social Worker:  Lupita Leash Elieser Tetrick, LCSWA  Date/time:  09/30/2012 07:15 PM  Clinical Social Work is seeking post-discharge placement for this patient at the following level of care:   SKILLED NURSING   (*CSW will update this form in Epic as items are completed)   09/30/2012  Patient/family provided with [facility] Department of Clinical Social Work's list of facilities offering this level of care within the geographic area requested by the patient (or if unable, by the patient's family).  09/30/2012  Patient/family informed of their freedom to choose among providers that offer the needed level of care, that participate in Medicare, Medicaid or managed care program needed by the patient, have an available bed and are willing to accept the patient.  09/30/2012  Patient/family informed of [facility]' ownership interest in [facility], as well as of the fact that they are under no obligation to receive care at this facility.  PASARR submitted to EDS on  PASARR number received from EDS on   FL2 transmitted to all facilities in geographic area requested by pt/family on   FL2 transmitted to all facilities within larger geographic area on   Patient informed that his/her managed care company has contracts with or will negotiate with  certain facilities, including the following:   Has existing PASARR     Patient/family informed of bed offers received:  10/01/12 Patient chooses bed at [facility] Physician recommends and patient [facility] bed [facility]    [facility]12 Patient to be transferred to facility by Ambulance  [facility])  The following physician request were entered in Epic:   Additional Comments: Patient and daughter are very pleased with d/c plan. Private room  available at [facility]. EMS transport arranged. Notified SNF and pt's nurse Nellie of d/c. No further CSW needs identified.  Lorri Frederick. [place]  [phone]

## 2012-10-01 NOTE — Clinical Social Work Psychosocial (Addendum)
    Clinical Social Work Department BRIEF PSYCHOSOCIAL ASSESSMENT 10/01/2012  Patient:  [name], [name]     Account Number:  [id number]     Admit date:  09/27/2012  Clinical Social Worker:  Tiburcio Pea  Date/Time:  09/30/2012 07:00 PM  Referred by:  Physician  Date Referred:  09/30/2012 Referred for  SNF Placement   Other Referral:   Interview type:  Patient Other interview type:    PSYCHOSOCIAL DATA Living Status:  ALONE Admitted from facility:  [facility] Level of care:  Independent Living Primary support name:  Elodia Florence Primary support relationship to patient:  CHILD, ADULT Degree of support available:   Strong support    CURRENT CONCERNS Current Concerns  Post-Acute Placement   Other Concerns:    SOCIAL WORK ASSESSMENT / PLAN Resident of [facility] and and planned to return to facility until recent PT visit in which it was determined that he would benefit from short term SNF. Patiient is agreeable and hopes for a private room at either [facility] or [facility] as he has been a resident at both facilities. First choice is [facility].  Fl2 will be completed for MD's signature and bed search initiated.   Assessment/plan status:  Psychosocial Support/Ongoing Assessment of Needs Other assessment/ plan:   Information/referral to community resources:   SNF list provided to patient.  Aftercare needs for HH/DME discussed and SNF will provide follow up    PATIENT'S/FAMILY'S RESPONSE TO PLAN OF CARE: Patient is agreeable to short term SNF and feels that he will benefit from this. CSW has not spoken to his daughter but he states that she supports this plan.  Patient makes his own decisions but includes his daughter.  CSW will follow up with SNF's in am to determine bed availability. Per MD- patient is stable for d/c.

## 2012-10-01 NOTE — Discharge Summary (Signed)
DISCHARGE SUMMARY  [name]  MR#: [id number]  DOB:1928-02-09  Date of Admission: 09/27/2012 Date of Discharge: 10/01/2012  Attending Physician:Lindsey Demonte,[name]  Patient's [name],[name], [name]  Consults: None indicated   Discharge Diagnoses:  Principal Problem:  *Acute-on-chronic respiratory failure secondary to COPD exacerbation  Active Problems:  CAD (coronary artery disease)  Type 2 diabetes mellitus  Permanent atrial fibrillation  Chronic anticoagulation  Chronic combined systolic and diastolic congestive heart failure   Past Medical History   Diagnosis  Date   .  Pneumonia  12/06/10     healthcare-associated/ left lower lobe   .  COPD (chronic obstructive pulmonary disease)      severe stage IV   .  Atrial fibrillation      on Coumadin with St. Jude single-chamber pacemaker   .  Diabetes mellitus      type 2; s/p Prednisone therapy for pneumonia 12/2010   .  Coronary artery disease      s/p anterior STEMI 09/2009; cath. revealed mid LAD 40%, distal LAD 100%- PTCA, prox. RCA 40%, mid. RCA 100% with good collateral filling of PDA; EF 25%; NSTEMI 07/2011 - PCI/DES 100% LCX   .  ST elevation (STEMI) myocardial infarction  01//11/12     anterior   .  CHF (congestive heart failure)      EF 25% on 09/26/10; EF 50-55% and grade 1 diastolic dysfunction on ECHO 08/2010   .  Hypertension    .  Hyperlipidemia    .  AAA (abdominal aortic aneurysm)      3 cm infrarenal abdominal aortic aneurysm per aorta ultrasound 03/2010   .  Osteoarthritis      s/p bilat hip arthroplasty   .  Angina    .  Ischemic cardiomyopathy      EF 35% LHC 11/12   .  GERD (gastroesophageal reflux disease)    .  Hypercholesteremia    .  PVD (peripheral vascular disease)    .  Anxiety     Past Surgical History   Procedure  Date   .  Hip arthroplasty      s/p right 09/27/10 and s/p left 09/24/10   .  Back surgery    .  Knee surgery    .  Pacemaker insertion  12/2010     St. Jude  single-chamber   .  Cardiac catheterization  09/2009, 06/2007     PTCA to distal LAD     Discharge Medications:   Medication List     As of 10/01/2012  8:16 AM    STOP taking these medications         ARTIFICIAL TEAR OP      TAKE these medications         amiodarone 200 MG tablet   Commonly known as: PACERONE   Take 1 tablet (200 mg total) by mouth daily.      beclomethasone 80 MCG/ACT inhaler   Commonly known as: QVAR   Inhale 1 puff into the lungs 2 (two) times daily.      bisacodyl 10 MG suppository   Commonly known as: DULCOLAX   Place 1 suppository (10 mg total) rectally daily as needed.      bisoprolol 10 MG tablet   Commonly known as: ZEBETA   Take 1 tablet (10 mg total) by mouth daily.      cholecalciferol 1000 UNITS tablet   Commonly known as: VITAMIN D   Take 2,000 Units by mouth daily.  dabigatran 150 MG Caps   Commonly known as: PRADAXA   Take 150 mg by mouth every 12 (twelve) hours.      digoxin 0.125 MG tablet   Commonly known as: LANOXIN   Take 0.125 mg by mouth daily.      diltiazem 360 MG 24 hr capsule   Commonly known as: CARDIZEM CD   Take 1 capsule (360 mg total) by mouth daily.      feeding supplement Liqd   Take 237 mLs by mouth 2 (two) times daily between meals.      finasteride 5 MG tablet   Commonly known as: PROSCAR   Take 5 mg by mouth daily.      furosemide 20 MG tablet   Commonly known as: LASIX   Take 20 mg by mouth daily. TAKE one daily      guaiFENesin 600 MG 12 hr tablet   Commonly known as: MUCINEX   Take 1,200 mg by mouth 2 (two) times daily.      ipratropium-albuterol 0.5-2.5 (3) MG/3ML Soln   Commonly known as: DUONEB   Take 3 mLs by nebulization every 6 (six) hours.      isosorbide mononitrate 60 MG 24 hr tablet   Commonly known as: IMDUR   Take 60 mg by mouth daily.      levofloxacin 500 MG tablet   Commonly known as: LEVAQUIN   Take 1 tablet (500 mg total) by mouth daily.      losartan 25 MG tablet    Commonly known as: COZAAR   Take 1 tablet (25 mg total) by mouth daily.      nitroGLYCERIN 0.4 MG SL tablet   Commonly known as: NITROSTAT   Place 0.4 mg under the tongue every 5 (five) minutes as needed. For chest pain      polyethylene glycol packet   Commonly known as: MIRALAX / GLYCOLAX   Take 17 g by mouth daily as needed.      predniSONE 5 MG tablet   Commonly known as: DELTASONE   Take 2 tablets (10 mg total) by mouth daily. 4 pills daily X 3 days, then 2 pills daily X 3 days, then 1 pill daily X 3 days, then resume 5mg  daily      rosuvastatin 20 MG tablet   Commonly known as: CRESTOR   Take 20 mg by mouth daily.      sennosides-docusate sodium 8.6-50 MG tablet   Commonly known as: SENOKOT-S   Take 2 tablets by mouth at bedtime.      Tamsulosin HCl 0.4 MG Caps   Commonly known as: FLOMAX   Take 0.4 mg by mouth daily.      zolpidem 5 MG tablet   Commonly known as: AMBIEN   Take 1 tablet (5 mg total) by mouth at bedtime as needed for sleep (insomnia).        Hospital Procedures: Dg Chest Port 1 View  09/27/2012  *RADIOLOGY REPORT*  Clinical Data: Chest pain, shortness of breath.  PORTABLE CHEST - 1 VIEW  Comparison: 04/20/2012  Findings: Left pacer remains in place, unchanged. There is hyperinflation of the lungs compatible with COPD.  Mild cardiomegaly.  No focal airspace opacity.  Scarring in the lung bases.  Small right pleural effusion versus pleural thickening, stable.  IMPRESSION: COPD/chronic changes.  Mild cardiomegaly.  Small right pleural effusion versus pleural thickening, stable.   Original Report Authenticated By: Charlett Nose, M.D.    History of Present Illness- (From Dr.  Avva's H&P):  This is an 79 year old Caucasian male who resides independently at [facility] with multiple medical problems including stage IV COPD followed by pulmonology, coronary artery disease with atrial fibrillation with pacemaker in place, on anticoagulation, but does function  fairly independently but does not need oxygen. Uses nebulizer treatments on a regular basis. However patient noted worsening issues with upper respiratory tract infection with clear to white discharge, coming from his nose as well as productive cough, worsening wheezing over the last 3-4 days but especially over the last 24 hours, accompanied by sore throat. Patient woke up this morning with significant work of breathing, EMS called, placed on BiPAP initially, received Solu-Medrol as well as 2 nebulizer treatments in route to the emergency room with some significant improvement. Per emergency room physician, patient speaking in full sentences. At the time denied any chest pain, palpitations, nausea, vomiting, pain, change in bowel habits, blood in stool or urine, lower extremity edema, new rashes, headache but did admit to nasal congestion, sore throat and worsening dyspnea.   Hospital Course:  [name] was admitted to a telemetry bed. He was placed on IV steroids (Solu-Medrol), IV antibiotics (Levaquin), and scheduled bronchodilators. With this, his respiratory status slowly improved. He was transitioned to oral steroids and antibiotics two days prior to discharge with continued improvement. He was able to ambulate to the restroom with some dyspnea but felt like he was stable for discharge home. Of note, he does have chronic severe COPD and is steroid dependent. He takes 5 mg daily at home. He has not been on oxygen in the past.   Of note, his blood sugars were significantly elevated with IV steroids. As he was transitioned to oral steroids his sugar improved. He was placed on Lantus briefly during this hospitalization but given his excellent control at home this will be discontinued prior to discharge. For his chronic congestive heart failure, losartan was added. He seemed to tolerate this well with blood pressures running in the 120s. He had no evidence of volume overload or congestive heart failure  symptoms during this hospitalization.  The patient was initially to be discharged on 1/15 but felt too weak for discharge and was dyspneic with minimal activity. PT/OT recommended SNF rehab.  On the morning of discharge he is feeling better with less dyspnea but still feels that SNF rehab would be helpful to transition back to independent living at [facility]  He is complaining of constipation and has received one dose of Miralax.  Will be given Dulcolax suppository as well.  Day of Discharge Exam BP 102/58  Pulse 66  Temp 97.8 F (36.6 C) (Oral)  Resp 18  Ht 5\' 9"  (1.753 m)  Wt 80.015 kg (176 lb 6.4 oz)  BMI 26.05 kg/m2  SpO2 99%  Physical Exam: General appearance:alert in no distress Eyes: no scleral icterus Throat: oropharynx moist without erythema Resp: no wheezing, better air movement Cardio: regular, no murmur GI: soft, non-tender; bowel sounds normal; no masses,  no organomegaly Extremities: no clubbing, cyanosis or edema  Discharge Labs:  [facility] 09/30/12 0550 09/28/12 0909  NA 138 135  K 4.2 3.9  CL 100 97  CO2 33* 27  GLUCOSE 97 226*  BUN 22 21  CREATININE 0.78 0.77  CALCIUM 8.7 8.7  MG -- --  PHOS -- --    Basename 09/28/12 0909  AST 26  ALT 31  ALKPHOS 58  BILITOT 0.4  PROT 5.8*  ALBUMIN 3.1*    Basename 09/30/12  0550 09/28/12 0909  WBC 9.2 11.3*  NEUTROABS -- --  HGB 12.5* 11.7*  HCT 40.4 37.2*  MCV 92.2 90.5  PLT 135* 122*   Lab Results  Component Value Date   INR 2.03* 09/28/2012   INR 2.43* 09/27/2012   INR 2.39* 04/14/2012    Discharge instructions:     Discharge Orders    Future Appointments: Provider: Department: Dept Phone: Center:   11/20/2012 1:30 PM Cassell Clement, [name] [facility] Main Office [place]) [phone] [id number]     Future Orders Please Complete By Expires   For home use only DME oxygen      Comments:   Dx: Severe COPD   Questions: Responses:   Mode or (Route) Nasal cannula   Liters per Minute 2    Frequency Continuous   Oxygen conserving device    Diet - low sodium heart healthy      Diet - low sodium heart healthy      Home Health      Questions: Responses:   To provide the following care/treatments PT    OT    RN   Face-to-face encounter      Comments:   I Alcario Tinkey,[name] certify that this patient is under my care and that I, or a nurse practitioner or physician's assistant working with me, had a face-to-face encounter that meets the physician face-to-face encounter requirements with this patient on 09/30/2012. The encounter with the patient was in whole, or in part for the following medical condition(s) which is the primary reason for home health care (List medical condition): COPD exacerbation, deconditioning   Questions: Responses:   The encounter with the patient was in whole, or in part, for the following medical condition, which is the primary reason for home health care COPD exacerbation   I certify that, based on my findings, the following services are medically necessary home health services Physical therapy   My clinical findings support the need for the above services Unable to leave home safely without assistance and/or assistive device   Further, I certify that my clinical findings support that this patient is homebound due to: Unable to leave home safely without assistance   To provide the following care/treatments PT    OT    RN   Increase activity slowly      Discharge instructions      Comments:   Continue PT/OT, Oxygen, bowel regimen for constipation.      Disposition: to SNF rehab  Follow-up Appts: Follow-up with Dr. [name] at [facility] in 1 week after discharge from SNF rehab.  Call for appointment.  Condition on Discharge: stable  Tests Needing Follow-up: None  Time with discharge activities: 40 minutes   CODE STATUS: DNR  Signed: Avon Mergenthaler,[name] 10/01/2012, 8:16 AM

## 2012-10-01 NOTE — Progress Notes (Signed)
Pt d/c home via ambulance.  Report called lto Inetta Fermo at [facility].  Verbalized understanding.  Amanda Pea, Charity fundraiser.

## 2012-10-11 IMAGING — CR DG CHEST 1V PORT
1 series · 2 of 2 positions shown · non-contrast
Comparison: 10/16/2010

CLINICAL DATA: Severe shortness of breath asthma and emphysema.

PORTABLE CHEST - 1 VIEW

[Series 1: AP · U · 2 of 2 slices shown]
[im 1/2]
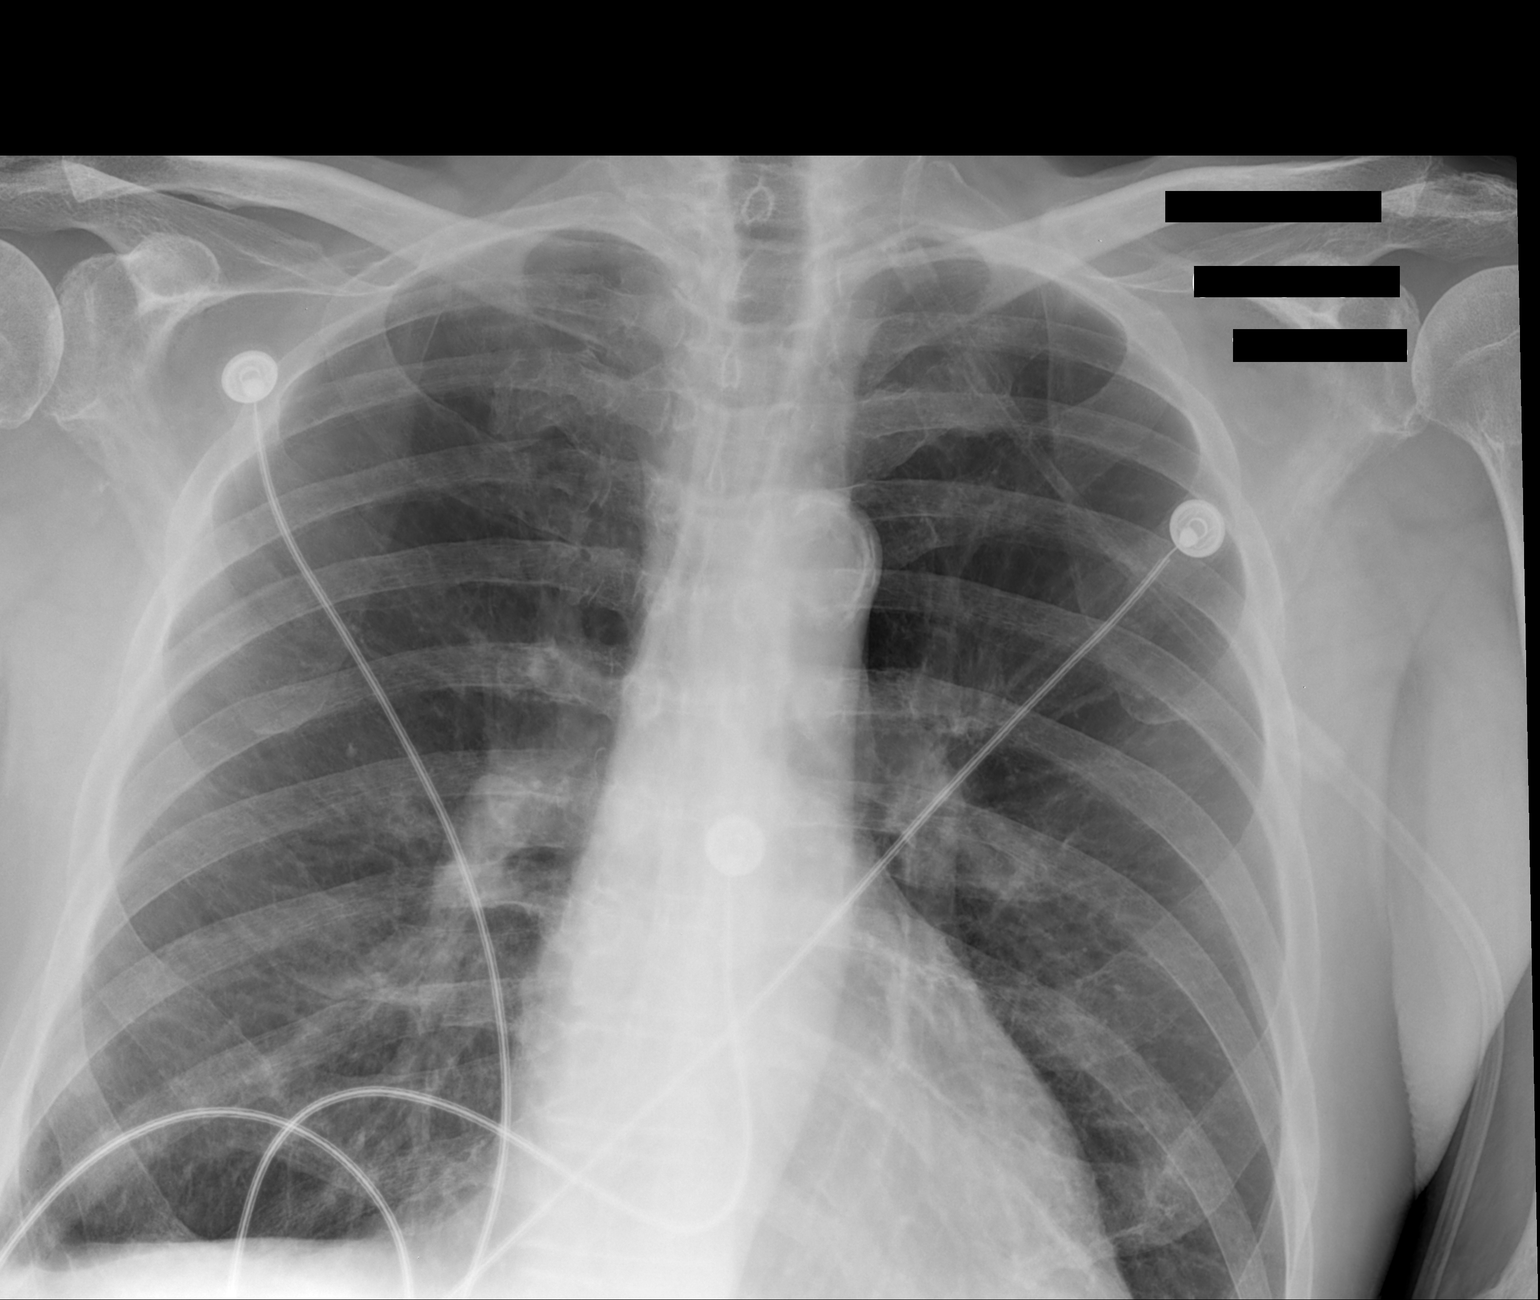
[im 2/2]
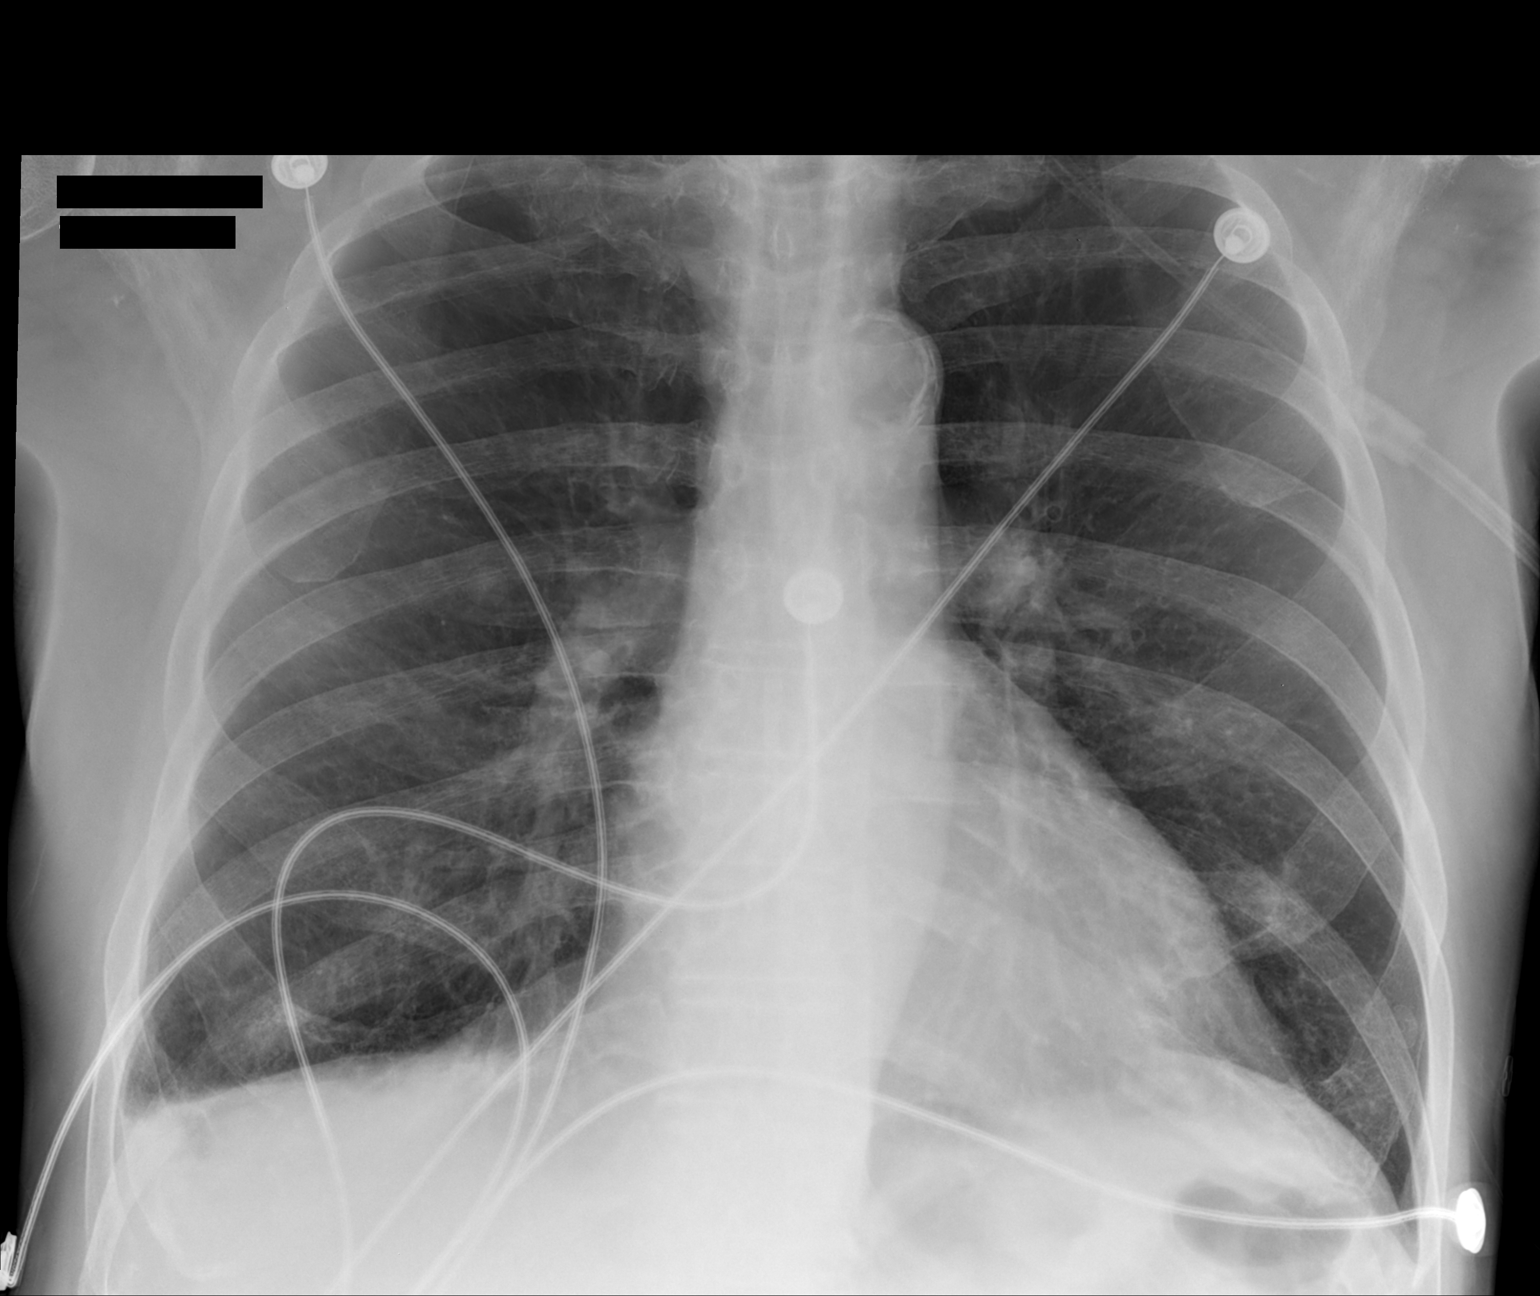

[2 of 2 positions shown; findings below may reference images not displayed]

FINDINGS: Normal heart size for portable technique.
Atherosclerotic calcification of the thoracic aortic arch.
Thoracic aorta contour is stable and within normal limits.
Emphysematous changes are present.  Mild scarring at the right
lateral costophrenic angle is stable.  No focal airspace disease,
effusion, or edema.  No pneumothorax.
IMPRESSION: Stable chest radiograph.  Emphysema.  No acute abnormality.

## 2012-11-20 ENCOUNTER — Ambulatory Visit: Payer: Medicare Other | Admitting: Cardiology

## 2012-11-23 ENCOUNTER — Encounter: Payer: Self-pay | Admitting: Cardiology

## 2012-11-26 ENCOUNTER — Encounter: Payer: Self-pay | Admitting: Cardiology

## 2012-11-26 ENCOUNTER — Ambulatory Visit (INDEPENDENT_AMBULATORY_CARE_PROVIDER_SITE_OTHER): Payer: Medicare Other | Admitting: Cardiology

## 2012-11-26 VITALS — BP 136/62 | HR 58 | Ht 69.0 in | Wt 171.0 lb

## 2012-11-26 DIAGNOSIS — I4821 Permanent atrial fibrillation: Secondary | ICD-10-CM

## 2012-11-26 DIAGNOSIS — I4891 Unspecified atrial fibrillation: Secondary | ICD-10-CM

## 2012-11-26 NOTE — Assessment & Plan Note (Signed)
The patient has not been expressing any chest pain or angina pectoris. 

## 2012-11-26 NOTE — Assessment & Plan Note (Addendum)
Patient has a history of permanent atrial fibrillation.  His rate remains controlled on current therapy which includes digoxin, amiodarone, bisoprolol and diltiazem.  He is on Pradaxa and has not had any TIA symptoms

## 2012-11-26 NOTE — Progress Notes (Signed)
[name] Date of Birth:  1927-09-22 [facility] [address] [place], [place]  [address] [phone]         Fax   [phone]  History of Present Illness: This pleasant 79 year old gentleman is seen for a scheduled 3 month followup office visit. He has a past history of severe COPD and has had multiple hospitalizations in the past for exacerbation of COPD and exacerbation of diastolic congestive heart failure. Following his last hospitalization he was placed with palliative care. He went to [facility] and is now back home in his own apartment in [place]. He has a past history of acute on chronic combined systolic and diastolic heart failure and a past history of atrial fibrillation with rapid ventricular response.  Since last visit he has been doing well.  His breathing is much improved and he is not on home oxygen   Current Outpatient Prescriptions  Medication Sig Dispense Refill  . amiodarone (PACERONE) 200 MG tablet Take 1 tablet (200 mg total) by mouth daily.  30 tablet  1  . beclomethasone (QVAR) 80 MCG/ACT inhaler Inhale 1 puff into the lungs 2 (two) times daily.      . bisacodyl (DULCOLAX) 10 MG suppository Place 1 suppository (10 mg total) rectally daily as needed.  12 suppository  1  . bisoprolol (ZEBETA) 10 MG tablet Take 1 tablet (10 mg total) by mouth daily.  30 tablet  3  . cholecalciferol (VITAMIN D) 1000 UNITS tablet Take 2,000 Units by mouth daily.      . dabigatran (PRADAXA) 150 MG CAPS Take 150 mg by mouth every 12 (twelve) hours.      . digoxin (LANOXIN) 0.125 MG tablet Take 0.125 mg by mouth daily.      Marland Kitchen diltiazem (CARDIZEM CD) 360 MG 24 hr capsule Take 1 capsule (360 mg total) by mouth daily.  30 capsule  3  . feeding supplement (GLUCERNA SHAKE) LIQD Take 237 mLs by mouth 2 (two) times daily between meals.      . finasteride (PROSCAR) 5 MG tablet Take 5 mg by mouth daily.      . furosemide (LASIX) 20 MG tablet Take 20 mg by  mouth daily. TAKE one daily      . guaiFENesin (MUCINEX) 600 MG 12 hr tablet Take 1,200 mg by mouth 2 (two) times daily.      . insulin aspart (NOVOLOG) 100 UNIT/ML injection Inject 0-5 Units into the skin at bedtime.  3 vial  3  . insulin aspart (NOVOLOG) 100 UNIT/ML injection Inject 0-9 Units into the skin 3 (three) times daily with meals.  3 vial  3  . ipratropium-albuterol (DUONEB) 0.5-2.5 (3) MG/3ML SOLN Take 3 mLs by nebulization every 6 (six) hours.      . isosorbide mononitrate (IMDUR) 60 MG 24 hr tablet Take 60 mg by mouth daily.        Marland Kitchen levofloxacin (LEVAQUIN) 500 MG tablet Take 1 tablet (500 mg total) by mouth daily.  4 tablet  0  . losartan (COZAAR) 25 MG tablet Take 1 tablet (25 mg total) by mouth daily.  30 tablet  6  . nitroGLYCERIN (NITROSTAT) 0.4 MG SL tablet Place 0.4 mg under the tongue every 5 (five) minutes as needed. For chest pain      . polyethylene glycol (MIRALAX / GLYCOLAX) packet Take 17 g by mouth daily as needed.  30 packet  3  . predniSONE (DELTASONE) 5 MG tablet Take 2 tablets (10  mg total) by mouth daily. 4 pills daily X 3 days, then 2 pills daily X 3 days, then 1 pill daily X 3 days, then resume 5mg  daily  30 tablet  0  . rosuvastatin (CRESTOR) 20 MG tablet Take 20 mg by mouth daily.      . sennosides-docusate sodium (SENOKOT-S) 8.6-50 MG tablet Take 2 tablets by mouth at bedtime.       . Tamsulosin HCl (FLOMAX) 0.4 MG CAPS Take 0.4 mg by mouth daily.      Marland Kitchen zolpidem (AMBIEN) 5 MG tablet Take 1 tablet (5 mg total) by mouth at bedtime as needed for sleep (insomnia).  30 tablet  1   No current facility-administered medications for this visit.    No Known Allergies  Patient Active Problem List  Diagnosis  . CHF (congestive heart failure)  . Abdominal aortic aneurysm  . Emphysema/COPD  . Pacemaker-St.Jude  . Post-nasal drip  . Unstable angina  . NSTEMI (non-ST elevated myocardial infarction)  . CAD (coronary artery disease)  . Dyspnea  . HLD  (hyperlipidemia)  . COPD exacerbation  . Spiritual Distress  . Acute on chronic combined systolic and diastolic heart failure  . Acute-on-chronic respiratory failure  . Hypoxemia  . Bronchitis  . Pulmonary edema  . Type 2 diabetes mellitus  . Chest pain  . COPD (chronic obstructive pulmonary disease)  . Permanent atrial fibrillation  . Depression  . Chronic anticoagulation  . E-coli UTI  . Hyponatremia  . Leg weakness, bilateral  . Chronic combined systolic and diastolic congestive heart failure    History  Smoking status  . Former Smoker -- 2.00 packs/day for 50 years  . Types: Cigarettes  . Quit date: 09/02/2009  Smokeless tobacco  . Never Used    Comment: smoked for 58yrs quit for 77yrs started back & smoked for 10ys    History  Alcohol Use No    Comment: occ - alcohol    Family History  Problem Relation Age of Onset  . Heart attack Mother 35  . Heart attack Father 50  . Cancer Other     siblings  . Heart attack Other     Review of Systems: Constitutional: no fever chills diaphoresis or fatigue or change in weight.  Head and neck: no hearing loss, no epistaxis, no photophobia or visual disturbance. Respiratory: No cough, shortness of breath or wheezing. Cardiovascular: No chest pain peripheral edema, palpitations. Gastrointestinal: No abdominal distention, no abdominal pain, no change in bowel habits hematochezia or melena. Genitourinary: No dysuria, no frequency, no urgency, no nocturia. Musculoskeletal:No arthralgias, no back pain, no gait disturbance or myalgias. Neurological: No dizziness, no headaches, no numbness, no seizures, no syncope, no weakness, no tremors. Hematologic: No lymphadenopathy, no easy bruising. Psychiatric: No confusion, no hallucinations, no sleep disturbance.    Physical Exam: Filed Vitals:   11/26/12 1031  BP: 136/62  Pulse: 58   the general appearance reveals a well-developed well-nourished elderly gentleman in no acute  distress.  He walks with a rolling walker.The head and neck exam reveals pupils equal and reactive.  Extraocular movements are full.  There is no scleral icterus.  The mouth and pharynx are normal.  The neck is supple.  The carotids reveal no bruits.  The jugular venous pressure is normal.  The  thyroid is not enlarged.  There is no lymphadenopathy.  The chest is clear to percussion and auscultation.  There are no rales or rhonchi.  Expansion of the chest is symmetrical.  The pulse is regular and paced.  The precordium is quiet.  The first heart sound is normal.  The second heart sound is physiologically split.  There is no murmur gallop rub or click.  There is no abnormal lift or heave.  The abdomen is soft and nontender.  The bowel sounds are normal.  The liver and spleen are not enlarged.  There are no abdominal masses.  There are no abdominal bruits.  Extremities reveal good pedal pulses.  There is no phlebitis or edema.  There is no cyanosis or clubbing.  Strength is normal and symmetrical in all extremities.  There is no lateralizing weakness.  There are no sensory deficits.  The skin is warm and dry.  There is no rash.  EKG shows paced ventricular rhythm with underlying fine atrial fibrillation   Assessment / Plan:  Continue same medication.  Patient is doing well.  He is very much improved from his last hospitalization. Recheck in 4 months for office visit and EKG.

## 2012-11-26 NOTE — Assessment & Plan Note (Signed)
The patient has had treatment of his chronic exertional dyspnea.  He remains on low-dose daily Lasix.  He is not having any exacerbation of his dyspnea.  He walks to the dining hall for his meals from his apartment at [facility] to [employer].

## 2012-11-26 NOTE — Patient Instructions (Addendum)
Your physician recommends that you continue on your current medications as directed. Please refer to the Current Medication list given to you today.  Your physician wants you to follow-up in: 4 month ov/ekg  You will receive a reminder letter in the mail two months in advance. If you don't receive a letter, please call our office to schedule the follow-up appointment.  

## 2012-12-09 ENCOUNTER — Ambulatory Visit (INDEPENDENT_AMBULATORY_CARE_PROVIDER_SITE_OTHER): Payer: Medicare Other | Admitting: Internal Medicine

## 2012-12-09 ENCOUNTER — Encounter: Payer: Self-pay | Admitting: Internal Medicine

## 2012-12-09 ENCOUNTER — Telehealth: Payer: Self-pay | Admitting: Internal Medicine

## 2012-12-09 VITALS — BP 106/58 | HR 108 | Temp 97.3°F | Ht 69.0 in | Wt 175.8 lb

## 2012-12-09 DIAGNOSIS — J441 Chronic obstructive pulmonary disease with (acute) exacerbation: Secondary | ICD-10-CM

## 2012-12-09 DIAGNOSIS — Z129 Encounter for screening for malignant neoplasm, site unspecified: Secondary | ICD-10-CM

## 2012-12-09 MED ORDER — PREDNISONE 10 MG PO TABS: ORAL_TABLET | ORAL | Status: DC

## 2012-12-09 NOTE — Progress Notes (Signed)
Subjective:    Patient ID: [name], male    DOB: [birth year], 79 y.o.   MRN: [id number]  HPI Problem list 1. 75 pack smoking hx. Quit 2010. Known CAD  2. COPD - Gold stage 3-4  On atrovent, pulmicort nebs, Xopenex and daily prednisone 5 mg - PFTs 11/21/2010  - spirometry only: Fev1 0.84L/31%, Ratio 45. Gold stage 3-4 COPD Spiro 04/26/2011 - fev1 1.06L/34% CAT score 17 - 02/04/2012 Did not desaturate May 2012 185 feet x 3 laps.   3. AECOPD hx:   -March 2012 (LLL PNA),   - April 2012 - clear cxr - Acute Visit 03/05/12 - OPD Rx - June 2013 - a fib and aECopd Admit - July 2013  - for HCAP - January 2013-admitted by Dr. Buren Kos for COPD exacerbation. Treated in the telemetry unit. No BiPAP - March 2014: Augmentin and prednisone outpatient treatment. Start N. Acetylcysteine  # lung cancer screening  - 2005 CT scan chest no evidence of lung cancer - 2014 January chest x-ray no evidence of lung cancer - 2014 March: discussed - he is not interested in screening, "I do not want to know about it"    OV 12/09/2012  COPD followup. He continues to live in [place]. He does daily exercises. In January 2014 he was admitted for COPD exacerbation. He was doing well but a few days ago developed sinusitis. His  primary care physician Dr. Buren Kos  started him on Augmentin on 12/04/2012. He still has a few more days of Augmentin left. He is feeling better but he feels that rate of resolution is low. He feels there is a lot of chest congestion. He had tried increasing his daily prednisone from 5 mg to 10 mg but this has not helped. COPD cat score is 25 and significantly worse than baseline; all documented below  CAT COPD Symptom & Quality of Life Score (GSK trademark) 0 is no burden. 5 is highest burden 05/07/2012  07/24/2012  12/09/2012   Never Cough -> Cough all the time 2 2 2   No phlegm in chest -> Chest is full of phlegm 2 3 2   No chest tightness -> Chest feels very tight 1 0 3   No dyspnea for 1 flight stairs/hill -> Very dyspneic for 1 flight of stairs 5 5 5   No limitations for ADL at home -> Very limited with ADL at home 5 1 4   Confident leaving home -> Not at all confident leaving home 4 0 4  Sleep soundly -> Do not sleep soundly because of lung condition 1 0 1  Lots of Energy -> No energy at all 4 3 1   TOTAL Score (max 40)  24 14 25     Review of Systems  Constitutional: Negative for fever and unexpected weight change.  HENT: Positive for congestion, rhinorrhea, postnasal drip and sinus pressure. Negative for ear pain, nosebleeds, sore throat, sneezing, trouble swallowing and dental problem.   Eyes: Negative for redness and itching.  Respiratory: Positive for cough and shortness of breath. Negative for chest tightness and wheezing.   Cardiovascular: Positive for chest pain. Negative for palpitations and leg swelling.  Gastrointestinal: Negative for nausea and vomiting.  Genitourinary: Negative for dysuria.  Musculoskeletal: Negative for joint swelling.  Skin: Negative for rash.  Neurological: Negative for headaches.  Hematological: Does not bruise/bleed easily.  Psychiatric/Behavioral: Negative for dysphoric mood. The patient is not nervous/anxious.        Objective:   Physical Exam Nursing  note and vitals reviewed. Constitutional: He is oriented to person, place, and time. He appears well-developed and well-nourished. No distress.       Deconditioned Body mass index is 25.95 kg/(m^2).  Sitting in chair with stroller  HENT:  Head: Normocephalic and atraumatic.  Right Ear: External ear normal.  Left Ear: External ear normal.  Mouth/Throat: Oropharynx is clear and moist. No oropharyngeal exudate.  Eyes: Conjunctivae and EOM are normal. Pupils are equal, round, and reactive to light. Right eye exhibits no discharge. Left eye exhibits no discharge. No scleral icterus.  Neck: Normal range of motion. Neck supple. No JVD present. No tracheal deviation  present. No thyromegaly present.  Cardiovascular: Normal rate, regular rhythm and intact distal pulses.  Exam reveals no gallop and no friction rub.   No murmur heard. Pulmonary/Chest: Effort normal and breath sounds normal. No respiratory distress. He has no wheezes. He has no rales. He exhibits no tenderness.  Abdominal: Soft. Bowel sounds are normal. He exhibits no distension and no mass. There is no tenderness. There is no rebound and no guarding.    Musculoskeletal: Normal range of motion. He exhibits no edema and no tenderness.  Lymphadenopathy:    He has no cervical adenopathy.  Neurological: He is alert and oriented to person, place, and time. He has normal reflexes. No cranial nerve deficit. Coordination normal.  Skin: Skin is warm and dry. No rash noted. He is not diaphoretic. No erythema. No pallor.  Psychiatric: He has a normal mood and affect. His behavior is normal. Judgment and thought content normal.         Assessment & Plan:

## 2012-12-09 NOTE — Patient Instructions (Addendum)
#  COPD  You have mild attack of copd called COPD exacerbation  Avoid exercises in the middle of a COPD exacerbation  Please take prednisone 40mg  once daily x 3 days, then 30mg  daily x 3 days, 20mg  once daily x 3 days, then 10mg  once daily x 3 days, then 5mg  once daily to continue  Start N. acetylcysteine 600 mg twice a day  - [place] study published in [employer]et [employer] Journal in 2014 shows it helps   - this is not to make you feel better but to to prevent recurrent bronchitis episodes  - You are more likely to get this drug at [facility] or a vitamin store then at [employer] or [employer] or [employer]  - You can also get this at website [employer].co.uk  - It functions as an anti-oxidant and there are minimal/none side effects  #Lung cancer screening - We discussed this but I respect your desire to postpone it  #Followup 4-5 months with COPD cat score at followup

## 2012-12-09 NOTE — Telephone Encounter (Signed)
Spoke with MR-he states okay to send Prednisone Rx electronically; daughter is aware that we have sent Rx. Nothing more needed at this time.

## 2012-12-10 ENCOUNTER — Telehealth: Payer: Self-pay | Admitting: Internal Medicine

## 2012-12-10 NOTE — Telephone Encounter (Signed)
I spoke with the pt daughter and she states she was having trouble finding the N. acetylcysteine medication and that pharmacy was advising her that she needed and RX. I advised her of the correct spelling, the website, and that she could also look at vitamin store. She states understanding. Carron Curie, CMA

## 2012-12-12 DIAGNOSIS — Z129 Encounter for screening for malignant neoplasm, site unspecified: Secondary | ICD-10-CM | POA: Insufficient documentation

## 2012-12-12 NOTE — Assessment & Plan Note (Signed)
#  lung cancer screening I discussed screening Ct chest for early detection of lung cancer Explained that in age 77-75 and smoking history, annual low dose CT chest can pick up lung cancer early and has potential to save lives and cure lung cancer This is similar in concept to screening mammogram, colonoscopies and pap smears I explained Ct scan is low dose radiation I explained early lung cancer asymptomatic and only way to  detect is CT  With the real advantage that early lung cancer is curable through radiation or surgery I explained CT superior to CXR I explained that false positives are present and can incur cost and workup like biopsies, additional scan but benefit outweighs risk I explained currently out of pocket $300 I recommend one a year for a minimum of 3 years   He is not interested in screening. He says with his chronic lung and  Heart disease and poor quality of life adding knowledge of cancer is not going to help his siutation even if this mean early diagnosis  > 50% of this > 25 min visit spent in face to face counseling (15 min visit converted to 25 min)

## 2012-12-12 NOTE — Assessment & Plan Note (Signed)
#  COPD  You have mild attack of copd called COPD exacerbation  Avoid exercises in the middle of a COPD exacerbation  Please take prednisone 40mg  once daily x 3 days, then 30mg  daily x 3 days, 20mg  once daily x 3 days, then 10mg  once daily x 3 days, then 5mg  once daily to continue  Start N. acetylcysteine 600 mg twice a day  - [place] study published in Lancet [employer] Journal in 2014 shows it helps   - this is not to make you feel better but to to prevent recurrent bronchitis episodes  - You are more likely to get this drug at [facility] or a vitamin store then at [employer] or [employer] or [employer]  - You can also get this at website [employer]  - It functions as an anti-oxidant and there are minimal/none side effects  #Lung cancer screening - We discussed this but I respect your desire to postpone it

## 2013-04-05 ENCOUNTER — Encounter: Payer: Self-pay | Admitting: Cardiology

## 2013-04-05 ENCOUNTER — Ambulatory Visit (INDEPENDENT_AMBULATORY_CARE_PROVIDER_SITE_OTHER): Payer: Medicare Other | Admitting: Cardiology

## 2013-04-05 VITALS — BP 136/60 | HR 58 | Ht 69.0 in | Wt 172.0 lb

## 2013-04-05 DIAGNOSIS — I4891 Unspecified atrial fibrillation: Secondary | ICD-10-CM

## 2013-04-05 DIAGNOSIS — I2510 Atherosclerotic heart disease of native coronary artery without angina pectoris: Secondary | ICD-10-CM

## 2013-04-05 DIAGNOSIS — I4821 Permanent atrial fibrillation: Secondary | ICD-10-CM

## 2013-04-05 DIAGNOSIS — I5042 Chronic combined systolic (congestive) and diastolic (congestive) heart failure: Secondary | ICD-10-CM

## 2013-04-05 DIAGNOSIS — I509 Heart failure, unspecified: Secondary | ICD-10-CM

## 2013-04-05 NOTE — Assessment & Plan Note (Signed)
The patient is in permanent atrial fibrillation.  He also has a past history of ventricular tachycardia.  He is doing well on low dose amiodarone 200 mg daily.  EKG shows atrial fibrillation with paced ventricular response.

## 2013-04-05 NOTE — Progress Notes (Signed)
[name] Date of Birth:  [birth year] [facility] [address] [place], [place]  [address] [phone]         Fax   [phone]  History of Present Illness: This pleasant 79 year old gentleman is seen for a scheduled 4 month followup office visit. He has a past history of severe COPD and has had multiple hospitalizations in the past for exacerbation of COPD and exacerbation of diastolic congestive heart failure. Following his last hospitalization he was placed with palliative care. He went to [place] [facility] and is now back home in his own apartment in [place]. He has a past history of acute on chronic combined systolic and diastolic heart failure and a past history of atrial fibrillation with rapid ventricular response. Since last visit he has been doing well. His breathing is much improved and he is not on home oxygen.  He walks from his room to the dining hall with a walker.   Current Outpatient Prescriptions  Medication Sig Dispense Refill  . amiodarone (PACERONE) 200 MG tablet Take 1 tablet (200 mg total) by mouth daily.  30 tablet  1  . amoxicillin-clavulanate (AUGMENTIN) 875-125 MG per tablet Take 1 tablet by mouth 2 (two) times daily.      . beclomethasone (QVAR) 80 MCG/ACT inhaler Inhale 1 puff into the lungs 2 (two) times daily.      . bisoprolol (ZEBETA) 10 MG tablet Take 1 tablet (10 mg total) by mouth daily.  30 tablet  3  . cholecalciferol (VITAMIN D) 1000 UNITS tablet Take 2,000 Units by mouth daily.      . dabigatran (PRADAXA) 150 MG CAPS Take 150 mg by mouth every 12 (twelve) hours.      . digoxin (LANOXIN) 0.125 MG tablet Take 0.125 mg by mouth daily.      . finasteride (PROSCAR) 5 MG tablet Take 5 mg by mouth daily.      . furosemide (LASIX) 20 MG tablet Take 20 mg by mouth daily. TAKE one daily      . guaiFENesin (MUCINEX) 600 MG 12 hr tablet Take 1,200 mg by mouth 2 (two) times daily.      Marland Kitchen ipratropium-albuterol (DUONEB) 0.5-2.5 (3)  MG/3ML SOLN Take 3 mLs by nebulization every 6 (six) hours.      . isosorbide mononitrate (IMDUR) 60 MG 24 hr tablet Take 60 mg by mouth daily.        Marland Kitchen losartan (COZAAR) 25 MG tablet Take 1 tablet (25 mg total) by mouth daily.  30 tablet  6  . nitroGLYCERIN (NITROSTAT) 0.4 MG SL tablet Place 0.4 mg under the tongue every 5 (five) minutes as needed. For chest pain      . polyethylene glycol (MIRALAX / GLYCOLAX) packet Take 17 g by mouth daily as needed.  30 packet  3  . predniSONE (DELTASONE) 10 MG tablet Take 4 tabs daily x 3 days, then 3 tabs daily x 3 days, then 2 tabs daily x 3 days, then 1 tab daily x 3, then 1/2 tab daily and continue at this dose.  30 tablet  0  . rosuvastatin (CRESTOR) 20 MG tablet Take 20 mg by mouth daily.      . sennosides-docusate sodium (SENOKOT-S) 8.6-50 MG tablet Take 2 tablets by mouth at bedtime.       . Tamsulosin HCl (FLOMAX) 0.4 MG CAPS Take 0.4 mg by mouth daily.       No current facility-administered medications for this visit.  No Known Allergies  Patient Active Problem List   Diagnosis Date Noted  . Cancer screening, for lung 12/12/2012  . Chronic combined systolic and diastolic congestive heart failure 09/28/2012  . Leg weakness, bilateral 04/13/2012  . E-coli UTI 04/09/2012  . Hyponatremia 04/09/2012  . Chronic anticoagulation 04/04/2012    Class: Chronic  . Depression 03/16/2012    Class: Chronic  . Type 2 diabetes mellitus 03/11/2012  . Chest pain 03/11/2012  . COPD (chronic obstructive pulmonary disease) 03/11/2012  . Permanent atrial fibrillation 03/11/2012  . Hypoxemia 03/10/2012  . Bronchitis 03/10/2012  . Pulmonary edema 03/10/2012  . Acute-on-chronic respiratory failure 11/15/2011  . Acute on chronic combined systolic and diastolic heart failure 11/11/2011  . Spiritual Distress 11/08/2011  . COPD exacerbation 11/07/2011  . Dyspnea 08/27/2011  . HLD (hyperlipidemia) 08/27/2011  . NSTEMI (non-ST elevated myocardial infarction)  08/15/2011  . CAD (coronary artery disease) 08/15/2011  . Unstable angina 08/13/2011  . Post-nasal drip 04/26/2011  . Pacemaker-St.Jude 04/02/2011  . CHF (congestive heart failure) 12/26/2010  . Abdominal aortic aneurysm 12/26/2010  . Emphysema/COPD 12/26/2010    History  Smoking status  . Former Smoker -- 2.00 packs/day for 50 years  . Types: Cigarettes  . Quit date: 09/02/2009  Smokeless tobacco  . Never Used    Comment: smoked for 93yrs quit for 74yrs started back & smoked for 10ys    History  Alcohol Use No    Comment: occ - alcohol    Family History  Problem Relation Age of Onset  . Heart attack Mother 21  . Heart attack Father 28  . Cancer Other     siblings  . Heart attack Other     Review of Systems: Constitutional: no fever chills diaphoresis or fatigue or change in weight.  Head and neck: no hearing loss, no epistaxis, no photophobia or visual disturbance. Respiratory: No cough, shortness of breath or wheezing. Cardiovascular: No chest pain peripheral edema, palpitations. Gastrointestinal: No abdominal distention, no abdominal pain, no change in bowel habits hematochezia or melena. Genitourinary: No dysuria, no frequency, no urgency, no nocturia. Musculoskeletal:No arthralgias, no back pain, no gait disturbance or myalgias. Neurological: No dizziness, no headaches, no numbness, no seizures, no syncope, no weakness, no tremors. Hematologic: No lymphadenopathy, no easy bruising. Psychiatric: No confusion, no hallucinations, no sleep disturbance.    Physical Exam: Filed Vitals:   04/05/13 1209  BP: 136/60  Pulse: 58   the general appearance reveals an alert pleasant elderly gentleman in no distress.  He is not dyspneic with ordinary activity.The head and neck exam reveals pupils equal and reactive.  Extraocular movements are full.  There is no scleral icterus.  The mouth and pharynx are normal.  The neck is supple.  The carotids reveal no bruits.  The  jugular venous pressure is normal.  The  thyroid is not enlarged.  There is no lymphadenopathy.  The chest is clear to percussion and auscultation.  There are no rales or rhonchi.  Expansion of the chest is symmetrical.  The precordium is quiet.  The first heart sound is normal.  The second heart sound is physiologically split.  There is no murmur gallop rub or click.  There is no abnormal lift or heave.  The abdomen is soft and nontender.  The bowel sounds are normal.  The liver and spleen are not enlarged.  There are no abdominal masses.  There are no abdominal bruits.  Extremities reveal good pedal pulses.  There is no phlebitis  or edema.  There is no cyanosis or clubbing.  Strength is normal and symmetrical in all extremities.  There is no lateralizing weakness.  There are no sensory deficits.  The skin is warm and dry.  There is no rash.  EKG shows fine atrial fibrillation with paced ventricular rhythm   Assessment / Plan: The patient continues to do very well.  Continue same medication.  Recheck in 4 months for office visit and EKG

## 2013-04-05 NOTE — Patient Instructions (Addendum)
Your physician recommends that you continue on your current medications as directed. Please refer to the Current Medication list given to you today.   Your physician wants you to follow-up in: 4 MONTHS WITH EKG You will receive a reminder letter in the mail two months in advance. If you don't receive a letter, please call our office to schedule the follow-up appointment.

## 2013-04-05 NOTE — Assessment & Plan Note (Signed)
The patient is not having any problems with fluid retention.  Occasionally if he sits with his legs down all day he'll have mild ankle edema.  Is not having any paroxysmal nocturnal dyspnea

## 2013-04-05 NOTE — Assessment & Plan Note (Signed)
The patient has not been experiencing any chest pain or angina pectoris. 

## 2013-04-08 ENCOUNTER — Encounter: Payer: Self-pay | Admitting: Internal Medicine

## 2013-04-08 ENCOUNTER — Ambulatory Visit (INDEPENDENT_AMBULATORY_CARE_PROVIDER_SITE_OTHER): Payer: Medicare Other | Admitting: Internal Medicine

## 2013-04-08 VITALS — BP 110/60 | HR 60 | Temp 97.7°F | Ht 69.0 in | Wt 173.4 lb

## 2013-04-08 DIAGNOSIS — J439 Emphysema, unspecified: Secondary | ICD-10-CM

## 2013-04-08 DIAGNOSIS — J438 Other emphysema: Secondary | ICD-10-CM

## 2013-04-08 NOTE — Patient Instructions (Addendum)
#  COPD - Continue xopenex mdi prn + prednisone 5mg  per day + qvar twice daily + duoneb neb 4 times daily - Start N. acetylcysteine 600 mg twice a day  - [place] study published in Lancet [employer] Journal in 2014 shows it helps   - this is not to make you feel better but to to prevent recurrent bronchitis episodes  - You are more likely to get this drug at [facility] or a vitamin store then at [employer] or [employer] or [employer]  - You can also get this at website [employer]  - It functions as an anti-oxidant and there are minimal/none side effects  - Flu shot in fall  #Followup 4-5 months with COPD cat score at followup

## 2013-04-08 NOTE — Progress Notes (Signed)
Subjective:    Patient ID: [name], male    DOB: 10-19-1927, 79 y.o.   MRN: [id number]  HPI #. 75 pack smoking hx. Quit 2010. Known CAD  #. COPD - Gold stage 3-4  On atrovent, pulmicort nebs, Xopenex and daily prednisone 5 mg - PFTs 11/21/2010  - spirometry only: Fev1 0.84L/31%, Ratio 45. Gold stage 3-4 COPD Spiro 04/26/2011 - fev1 1.06L/34% CAT score 17 - 02/04/2012 Did not desaturate May 2012 185 feet x 3 laps.   3. AECOPD hx:   -March 2012 (LLL PNA),   - April 2012 - clear cxr - Acute Visit 03/05/12 - OPD Rx - June 2013 - a fib and aECopd Admit - July 2013  - for HCAP - January 2013-admitted by Dr. Buren Kos for COPD exacerbation. Treated in the telemetry unit. No BiPAP - March 2014: Augmentin and prednisone outpatient treatment. Start N. Acetylcysteine  #IMaging hx  - 2005 CT scan chest no evidence of lung cancer - 2014 January chest x-ray no evidence of lung cancer - 2014 March: discussed - he is not interested in screening, "I do not want to know about it"    OV 12/09/2012  COPD followup. He continues to live in [place]. He does daily exercises. In January 2014 he was admitted for COPD exacerbation. He was doing well but a few days ago developed sinusitis. His  primary care physician Dr. Buren Kos  started him on Augmentin on 12/04/2012. He still has a few more days of Augmentin left. He is feeling better but he feels that rate of resolution is low. He feels there is a lot of chest congestion. He had tried increasing his daily prednisone from 5 mg to 10 mg but this has not helped. COPD cat score is 25 and significantly worse than baseline; all documented below  #COPD  You have mild attack of copd called COPD exacerbation  Avoid exercises in the middle of a COPD exacerbation  Please take prednisone 40mg  once daily x 3 days, then 30mg  daily x 3 days, 20mg  once daily x 3 days, then 10mg  once daily x 3 days, then 5mg  once daily to continue  Start N.  acetylcysteine 600 mg twice a day  - [place] study published in Lancet [employer] Journal in 2014 shows it helps   - this is not to make you feel better but to to prevent recurrent bronchitis episodes  - You are more likely to get this drug at [facility] or a vitamin store then at [employer] or [employer] or [employer]  - You can also get this at website [employer].co.uk  - It functions as an anti-oxidant and there are minimal/none side effects  #Lung cancer screening - We discussed this but I respect your desire to postpone it  #Followup 4-5 months with COPD cat score at followup  OV 04/08/2013  FU COPD -- severe/very severe  Since lst OV has improved. More funcitonal now at [facility]. Occ uses cane. COPD is stable. CAT score is 17 (see below) and reflects baseline activity. He is looking better an dfeeling better. Continues on duopneb q6h + qvar bid + xopnex prn + pred 5mg  per day. Has not taken NAC. REltuctant for mdi changes due to vA system logistics.  Only issue is recent cold and saw PMD who bumped his pred up and now back to [place]. He does not want to titrate prednisone down further; wants to remain at 5mg  per day    CAT COPD Symptom &  Quality of Life Score (GSK trademark) 0 is no burden. 5 is highest burden 05/07/2012  07/24/2012  12/09/2012 aecopd office Rx 04/08/2013 stable  Never Cough -> Cough all the time 2 2 2 2   No phlegm in chest -> Chest is full of phlegm 2 3 2 2   No chest tightness -> Chest feels very tight 1 0 3 1  No dyspnea for 1 flight stairs/hill -> Very dyspneic for 1 flight of stairs 5 5 5 4   No limitations for ADL at home -> Very limited with ADL at home 5 1 4 2   Confident leaving home -> Not at all confident leaving home 4 0 4 2  Sleep soundly -> Do not sleep soundly because of lung condition 1 0 1 1  Lots of Energy -> No energy at all 4 3 1 3   TOTAL Score (max 40)  24 14 25 17    Past, Family, Social reviewed: no change since last visit  Review of Systems   Constitutional: Negative for fever and unexpected weight change.  HENT: Positive for postnasal drip. Negative for ear pain, nosebleeds, congestion, sore throat, rhinorrhea, sneezing, trouble swallowing, dental problem and sinus pressure.   Eyes: Negative for redness and itching.  Respiratory: Positive for shortness of breath. Negative for cough, chest tightness and wheezing.   Cardiovascular: Negative for palpitations and leg swelling.  Gastrointestinal: Negative for nausea and vomiting.  Genitourinary: Negative for dysuria.  Musculoskeletal: Negative for joint swelling.  Skin: Negative for rash.  Neurological: Negative for headaches.  Hematological: Does not bruise/bleed easily.  Psychiatric/Behavioral: Negative for dysphoric mood. The patient is not nervous/anxious.   Current outpatient prescriptions:amiodarone (PACERONE) 200 MG tablet, Take 1 tablet (200 mg total) by mouth daily., Disp: 30 tablet, Rfl: 1;  amoxicillin-clavulanate (AUGMENTIN) 875-125 MG per tablet, Take 1 tablet by mouth 2 (two) times daily., Disp: , Rfl: ;  beclomethasone (QVAR) 80 MCG/ACT inhaler, Inhale 1 puff into the lungs 2 (two) times daily., Disp: , Rfl:  bisoprolol (ZEBETA) 10 MG tablet, Take 1 tablet (10 mg total) by mouth daily., Disp: 30 tablet, Rfl: 3;  cholecalciferol (VITAMIN D) 1000 UNITS tablet, Take 2,000 Units by mouth daily., Disp: , Rfl: ;  dabigatran (PRADAXA) 150 MG CAPS, Take 150 mg by mouth every 12 (twelve) hours., Disp: , Rfl: ;  digoxin (LANOXIN) 0.125 MG tablet, Take 0.125 mg by mouth daily., Disp: , Rfl:  finasteride (PROSCAR) 5 MG tablet, Take 5 mg by mouth daily., Disp: , Rfl: ;  furosemide (LASIX) 20 MG tablet, Take 20 mg by mouth daily. TAKE one daily, Disp: , Rfl: ;  guaiFENesin (MUCINEX) 600 MG 12 hr tablet, Take 1,200 mg by mouth 2 (two) times daily., Disp: , Rfl: ;  ipratropium-albuterol (DUONEB) 0.5-2.5 (3) MG/3ML SOLN, Take 3 mLs by nebulization every 6 (six) hours., Disp: , Rfl:  isosorbide  mononitrate (IMDUR) 60 MG 24 hr tablet, Take 60 mg by mouth daily.  , Disp: , Rfl: ;  losartan (COZAAR) 25 MG tablet, Take 1 tablet (25 mg total) by mouth daily., Disp: 30 tablet, Rfl: 6;  nitroGLYCERIN (NITROSTAT) 0.4 MG SL tablet, Place 0.4 mg under the tongue every 5 (five) minutes as needed. For chest pain, Disp: , Rfl: ;  polyethylene glycol (MIRALAX / GLYCOLAX) packet, Take 17 g by mouth daily as needed., Disp: 30 packet, Rfl: 3 predniSONE (DELTASONE) 10 MG tablet, Take 5 mg by mouth daily., Disp: , Rfl: ;  rosuvastatin (CRESTOR) 20 MG tablet, Take 20 mg by  mouth daily., Disp: , Rfl: ;  sennosides-docusate sodium (SENOKOT-S) 8.6-50 MG tablet, Take 2 tablets by mouth at bedtime. , Disp: , Rfl: ;  sitaGLIPtin (JANUVIA) 25 MG tablet, Take 25 mg by mouth daily., Disp: , Rfl: ;  Tamsulosin HCl (FLOMAX) 0.4 MG CAPS, Take 0.4 mg by mouth daily., Disp: , Rfl:       Objective:   Physical Exam Nursing note and vitals reviewed. Constitutional: He is oriented to person, place, and time. He appears well-developed and well-nourished. No distress.  Improved condition  Body mass index is 25.6 kg/(m^2).  Sittingin chair - no strolleer this time  HENT:  Head: Normocephalic and atraumatic.  Right Ear: External ear normal.  Left Ear: External ear normal.  Mouth/Throat: Oropharynx is clear and moist. No oropharyngeal exudate.  Eyes: Conjunctivae and EOM are normal. Pupils are equal, round, and reactive to light. Right eye exhibits no discharge. Left eye exhibits no discharge. No scleral icterus.  Neck: Normal range of motion. Neck supple. No JVD present. No tracheal deviation present. No thyromegaly present.  Cardiovascular: Normal rate, regular rhythm and intact distal pulses.  Exam reveals no gallop and no friction rub.   No murmur heard. Pulmonary/Chest: Effort normal and breath sounds normal. No respiratory distress. He has no wheezes. He has no rales. He exhibits no tenderness.  Abdominal: Soft. Bowel  sounds are normal. He exhibits no distension and no mass. There is no tenderness. There is no rebound and no guarding.    Musculoskeletal: Normal range of motion. He exhibits no edema and no tenderness.  Lymphadenopathy:    He has no cervical adenopathy.  Neurological: He is alert and oriented to person, place, and time. He has normal reflexes. No cranial nerve deficit. Coordination normal.  Skin: Skin is warm and dry. No rash noted. He is not diaphoretic. No erythema. No pallor.  Psychiatric: He has a normal mood and affect. His behavior is normal. Judgment and thought content normal.               Assessment & Plan:

## 2013-04-15 NOTE — Assessment & Plan Note (Signed)
Stable disease  #COPD - Continue xopenex mdi prn + prednisone 5mg  per day + qvar twice daily + duoneb neb 4 times daily - Start N. acetylcysteine 600 mg twice a day  - [place] study published in Lancet [employer] Journal in 2014 shows it helps   - this is not to make you feel better but to to prevent recurrent bronchitis episodes  - You are more likely to get this drug at [facility] or a vitamin store then at [employer] or [facility] or [employer]  - You can also get this at website [employer].co.uk  - It functions as an anti-oxidant and there are minimal/none side effects  - Flu shot in fall  #Followup 4-5 months with COPD cat score at followup

## 2013-04-20 ENCOUNTER — Encounter: Payer: Self-pay | Admitting: Cardiology

## 2013-04-20 ENCOUNTER — Encounter: Payer: Self-pay | Admitting: *Deleted

## 2013-04-21 ENCOUNTER — Encounter: Payer: Self-pay | Admitting: Cardiology

## 2013-04-21 ENCOUNTER — Encounter: Payer: Self-pay | Admitting: *Deleted

## 2013-06-15 ENCOUNTER — Encounter: Payer: Self-pay | Admitting: Internal Medicine

## 2013-06-15 ENCOUNTER — Ambulatory Visit (INDEPENDENT_AMBULATORY_CARE_PROVIDER_SITE_OTHER): Payer: Medicare Other | Admitting: Internal Medicine

## 2013-06-15 VITALS — BP 122/64 | HR 92 | Ht 69.0 in | Wt 177.0 lb

## 2013-06-15 DIAGNOSIS — I4891 Unspecified atrial fibrillation: Secondary | ICD-10-CM

## 2013-06-15 DIAGNOSIS — I472 Ventricular tachycardia, unspecified: Secondary | ICD-10-CM | POA: Insufficient documentation

## 2013-06-15 DIAGNOSIS — Z950 Presence of cardiac pacemaker: Secondary | ICD-10-CM

## 2013-06-15 DIAGNOSIS — I4821 Permanent atrial fibrillation: Secondary | ICD-10-CM

## 2013-06-15 DIAGNOSIS — I5043 Acute on chronic combined systolic (congestive) and diastolic (congestive) heart failure: Secondary | ICD-10-CM

## 2013-06-15 LAB — PACEMAKER DEVICE OBSERVATION
BATTERY VOLTAGE: 2.9779 V
BMOD-0002RV: 12
BRDY-0004RV: 120 {beats}/min
BRDY-0005RV: 50 {beats}/min
DEVICE MODEL PM: 7207053
RV LEAD THRESHOLD: 0.75 V
VENTRICULAR PACING PM: 77

## 2013-06-15 NOTE — Assessment & Plan Note (Signed)
Pacemaker interrogation demonstrates that his ventricular tachycardia has been well-controlled. For this reason he will continue amiodarone.

## 2013-06-15 NOTE — Patient Instructions (Addendum)
Your physician wants you to follow-up in: 6 months in the device clinic and 12 months with Dr Taylor You will receive a reminder letter in the mail two months in advance. If you don't receive a letter, please call our office to schedule the follow-up appointment.  

## 2013-06-15 NOTE — Assessment & Plan Note (Signed)
His St. Jude pacemaker is working normally. We'll plan to recheck in several months. 

## 2013-06-15 NOTE — Assessment & Plan Note (Signed)
His heart failure symptoms are well compensated and class II. He'll continue his current medical therapy and maintain a low-sodium diet.

## 2013-06-15 NOTE — Progress Notes (Signed)
HPI Mr. Neidhardt returns today for followup. He is a very pleasant 79 year old man with chronic atrial fibrillation, ventricular tachycardia, coronary artery disease, symptomatic bradycardia, status post pacemaker insertion. The patient also has chronic prostate problems and is anticoagulated for prevention of thromboembolic events. No Known Allergies   Current Outpatient Prescriptions  Medication Sig Dispense Refill  . amoxicillin-clavulanate (AUGMENTIN) 875-125 MG per tablet Take 1 tablet by mouth 2 (two) times daily.      . beclomethasone (QVAR) 80 MCG/ACT inhaler Inhale 1 puff into the lungs 2 (two) times daily.      . bisoprolol (ZEBETA) 10 MG tablet Take 1 tablet (10 mg total) by mouth daily.  30 tablet  3  . cholecalciferol (VITAMIN D) 1000 UNITS tablet Take 2,000 Units by mouth daily.      . dabigatran (PRADAXA) 150 MG CAPS Take 150 mg by mouth every 12 (twelve) hours.      . digoxin (LANOXIN) 0.125 MG tablet Take 0.125 mg by mouth daily.      . finasteride (PROSCAR) 5 MG tablet Take 5 mg by mouth daily.      . furosemide (LASIX) 20 MG tablet Take 20 mg by mouth daily. TAKE one daily      . guaiFENesin (MUCINEX) 600 MG 12 hr tablet Take 1,200 mg by mouth 2 (two) times daily.      Marland Kitchen ipratropium-albuterol (DUONEB) 0.5-2.5 (3) MG/3ML SOLN Take 3 mLs by nebulization every 6 (six) hours.      . isosorbide mononitrate (IMDUR) 60 MG 24 hr tablet Take 60 mg by mouth daily.        Marland Kitchen losartan (COZAAR) 25 MG tablet Take 1 tablet (25 mg total) by mouth daily.  30 tablet  6  . nitroGLYCERIN (NITROSTAT) 0.4 MG SL tablet Place 0.4 mg under the tongue every 5 (five) minutes as needed. For chest pain      . polyethylene glycol (MIRALAX / GLYCOLAX) packet Take 17 g by mouth daily as needed.  30 packet  3  . predniSONE (DELTASONE) 10 MG tablet Take 5 mg by mouth daily.      . rosuvastatin (CRESTOR) 20 MG tablet Take 20 mg by mouth daily.      . sennosides-docusate sodium (SENOKOT-S) 8.6-50 MG tablet  Take 2 tablets by mouth at bedtime.       . sitaGLIPtin (JANUVIA) 25 MG tablet Take 25 mg by mouth daily.      . Tamsulosin HCl (FLOMAX) 0.4 MG CAPS Take 0.4 mg by mouth daily.      Marland Kitchen amiodarone (PACERONE) 200 MG tablet Take 1 tablet (200 mg total) by mouth daily.  30 tablet  1   No current facility-administered medications for this visit.     Past Medical History  Diagnosis Date  . Pneumonia 12/06/10    healthcare-associated/ left lower lobe  . COPD (chronic obstructive pulmonary disease)     severe stage IV  . Atrial fibrillation     on Coumadin with St. Jude single-chamber pacemaker  . Diabetes mellitus     type 2; s/p Prednisone therapy for pneumonia 12/2010  . Coronary artery disease     s/p anterior STEMI 09/2009; cath. revealed mid LAD 40%, distal LAD 100%- PTCA, prox. RCA 40%, mid. RCA 100% with good collateral filling of PDA; EF 25%; NSTEMI 07/2011 - PCI/DES 100% LCX  . ST elevation (STEMI) myocardial infarction 01//11/12    anterior  . CHF (congestive heart failure)     EF 25% on 09/26/10;  EF 50-55% and grade 1 diastolic dysfunction on ECHO 08/2010   . Hypertension   . Hyperlipidemia   . AAA (abdominal aortic aneurysm)     3 cm infrarenal abdominal aortic aneurysm per aorta ultrasound 03/2010  . Osteoarthritis     s/p bilat hip arthroplasty  . Angina   . Ischemic cardiomyopathy     EF 35% LHC 11/12  . GERD (gastroesophageal reflux disease)   . Hypercholesteremia   . PVD (peripheral vascular disease)   . Anxiety     ROS:   All systems reviewed and negative except as noted in the HPI.   Past Surgical History  Procedure Laterality Date  . Hip arthroplasty      s/p right 09/27/10 and s/p left 09/24/10  . Back surgery    . Knee surgery    . Pacemaker insertion  12/2010    St. Jude single-chamber   . Cardiac catheterization  09/2009, 06/2007    PTCA to distal LAD     Family History  Problem Relation Age of Onset  . Heart attack Mother 46  . Heart attack  Father 48  . Cancer Other     siblings  . Heart attack Other      History   Social History  . Marital Status: Widowed    Spouse Name: N/A    Number of Children: N/A  . Years of Education: N/A   Occupational History  . Not on file.   Social History Main Topics  . Smoking status: Former Smoker -- 2.00 packs/day for 50 years    Types: Cigarettes    Quit date: 09/02/2009  . Smokeless tobacco: Never Used     Comment: smoked for 62yrs quit for 51yrs started back & smoked for 10ys  . Alcohol Use: No     Comment: occ - alcohol  . Drug Use: No  . Sexual Activity: Not Currently   Other Topics Concern  . Not on file   Social History Narrative  . No narrative on file     BP 122/64  Pulse 92  Ht 5\' 9"  (1.753 m)  Wt 177 lb (80.287 kg)  BMI 26.13 kg/m2  Physical Exam:  Well appearing elderly man, NAD HEENT: Unremarkable Neck:  No JVD, no thyromegall Back:  No CVA tenderness Lungs:  Clear with no wheezes, rales, or rhonchi. HEART:  Regular rate rhythm, no murmurs, no rubs, no clicks Abd:  soft, positive bowel sounds, no organomegally, no rebound, no guarding Ext:  2 plus pulses, no edema, no cyanosis, no clubbing Skin:  No rashes no nodules Neuro:  CN II through XII intact, motor grossly intact   DEVICE  Normal device function.  See PaceArt for details.   Assess/Plan:

## 2013-06-15 NOTE — Assessment & Plan Note (Signed)
The patient is maintaining atrial fibrillation despite amiodarone therapy. Normally I would stop this medication. However he has a history of ventricular tachycardia.

## 2013-06-23 ENCOUNTER — Encounter: Payer: Self-pay | Admitting: Internal Medicine

## 2013-07-09 ENCOUNTER — Emergency Department (HOSPITAL_COMMUNITY): Payer: Medicare Other

## 2013-07-09 ENCOUNTER — Inpatient Hospital Stay (HOSPITAL_COMMUNITY)
Admission: EM | Admit: 2013-07-09 | Discharge: 2013-07-13 | DRG: 190 | Disposition: A | Discharge status: Home / Self Care | Payer: Medicare Other | Attending: Internal Medicine | Admitting: Internal Medicine

## 2013-07-09 ENCOUNTER — Encounter (HOSPITAL_COMMUNITY): Payer: Self-pay | Admitting: Emergency Medicine

## 2013-07-09 DIAGNOSIS — Z950 Presence of cardiac pacemaker: Secondary | ICD-10-CM

## 2013-07-09 DIAGNOSIS — E7800 Pure hypercholesterolemia, unspecified: Secondary | ICD-10-CM | POA: Diagnosis present

## 2013-07-09 DIAGNOSIS — Z87891 Personal history of nicotine dependence: Secondary | ICD-10-CM

## 2013-07-09 DIAGNOSIS — I2510 Atherosclerotic heart disease of native coronary artery without angina pectoris: Secondary | ICD-10-CM | POA: Diagnosis present

## 2013-07-09 DIAGNOSIS — I5043 Acute on chronic combined systolic (congestive) and diastolic (congestive) heart failure: Secondary | ICD-10-CM | POA: Diagnosis present

## 2013-07-09 DIAGNOSIS — J449 Chronic obstructive pulmonary disease, unspecified: Secondary | ICD-10-CM

## 2013-07-09 DIAGNOSIS — I252 Old myocardial infarction: Secondary | ICD-10-CM

## 2013-07-09 DIAGNOSIS — F411 Generalized anxiety disorder: Secondary | ICD-10-CM | POA: Diagnosis present

## 2013-07-09 DIAGNOSIS — E119 Type 2 diabetes mellitus without complications: Secondary | ICD-10-CM | POA: Diagnosis present

## 2013-07-09 DIAGNOSIS — Z66 Do not resuscitate: Secondary | ICD-10-CM | POA: Diagnosis not present

## 2013-07-09 DIAGNOSIS — I4821 Permanent atrial fibrillation: Secondary | ICD-10-CM | POA: Diagnosis present

## 2013-07-09 DIAGNOSIS — I059 Rheumatic mitral valve disease, unspecified: Secondary | ICD-10-CM

## 2013-07-09 DIAGNOSIS — I2589 Other forms of chronic ischemic heart disease: Secondary | ICD-10-CM | POA: Diagnosis present

## 2013-07-09 DIAGNOSIS — I714 Abdominal aortic aneurysm, without rupture, unspecified: Secondary | ICD-10-CM | POA: Diagnosis present

## 2013-07-09 DIAGNOSIS — R0600 Dyspnea, unspecified: Secondary | ICD-10-CM

## 2013-07-09 DIAGNOSIS — I5042 Chronic combined systolic (congestive) and diastolic (congestive) heart failure: Secondary | ICD-10-CM | POA: Diagnosis present

## 2013-07-09 DIAGNOSIS — I10 Essential (primary) hypertension: Secondary | ICD-10-CM | POA: Diagnosis present

## 2013-07-09 DIAGNOSIS — Z7901 Long term (current) use of anticoagulants: Secondary | ICD-10-CM

## 2013-07-09 DIAGNOSIS — J441 Chronic obstructive pulmonary disease with (acute) exacerbation: Principal | ICD-10-CM | POA: Diagnosis present

## 2013-07-09 DIAGNOSIS — T380X5A Adverse effect of glucocorticoids and synthetic analogues, initial encounter: Secondary | ICD-10-CM | POA: Diagnosis not present

## 2013-07-09 DIAGNOSIS — D696 Thrombocytopenia, unspecified: Secondary | ICD-10-CM | POA: Diagnosis present

## 2013-07-09 DIAGNOSIS — Z79899 Other long term (current) drug therapy: Secondary | ICD-10-CM

## 2013-07-09 DIAGNOSIS — E785 Hyperlipidemia, unspecified: Secondary | ICD-10-CM | POA: Diagnosis present

## 2013-07-09 DIAGNOSIS — J9620 Acute and chronic respiratory failure, unspecified whether with hypoxia or hypercapnia: Secondary | ICD-10-CM | POA: Diagnosis present

## 2013-07-09 DIAGNOSIS — I739 Peripheral vascular disease, unspecified: Secondary | ICD-10-CM | POA: Diagnosis present

## 2013-07-09 DIAGNOSIS — I4891 Unspecified atrial fibrillation: Secondary | ICD-10-CM | POA: Diagnosis present

## 2013-07-09 DIAGNOSIS — I509 Heart failure, unspecified: Secondary | ICD-10-CM | POA: Diagnosis present

## 2013-07-09 DIAGNOSIS — K219 Gastro-esophageal reflux disease without esophagitis: Secondary | ICD-10-CM | POA: Diagnosis present

## 2013-07-09 LAB — COMPREHENSIVE METABOLIC PANEL
CO2: 23 mEq/L (ref 19–32)
Calcium: 9.2 mg/dL (ref 8.4–10.5)
Creatinine, Ser: 1.02 mg/dL (ref 0.50–1.35)
GFR calc Af Amer: 76 mL/min — ABNORMAL LOW (ref >90)
GFR calc non Af Amer: 66 mL/min — ABNORMAL LOW (ref >90)
Glucose, Bld: 144 mg/dL — ABNORMAL HIGH (ref 70–99)
Sodium: 136 mEq/L (ref 135–145)
Total Protein: 6.3 g/dL (ref 6.0–8.3)

## 2013-07-09 LAB — CBC
Hemoglobin: 13 g/dL (ref 13.0–17.0)
MCH: 29.2 pg (ref 26.0–34.0)
MCHC: 31.3 g/dL (ref 30.0–36.0)
MCV: 93.3 fL (ref 78.0–100.0)
RBC: 4.45 MIL/uL (ref 4.22–5.81)

## 2013-07-09 LAB — MRSA PCR SCREENING: MRSA by PCR: POSITIVE — AB

## 2013-07-09 LAB — DIGOXIN LEVEL: Digoxin Level: 0.9 ng/mL (ref 0.8–2.0)

## 2013-07-09 LAB — GLUCOSE, CAPILLARY
Glucose-Capillary: 162 mg/dL — ABNORMAL HIGH (ref 70–99)
Glucose-Capillary: 171 mg/dL — ABNORMAL HIGH (ref 70–99)
Glucose-Capillary: 219 mg/dL — ABNORMAL HIGH (ref 70–99)

## 2013-07-09 LAB — TROPONIN I: Troponin I: <0.3 ng/mL (ref <0.30)

## 2013-07-09 LAB — PRO B NATRIURETIC PEPTIDE: Pro B Natriuretic peptide (BNP): 2523 pg/mL — ABNORMAL HIGH (ref 0–450)

## 2013-07-09 MED ORDER — ENOXAPARIN SODIUM 40 MG/0.4ML ~~LOC~~ SOLN: 40.0000 mg | SUBCUTANEOUS | Status: DC

## 2013-07-09 MED ORDER — FUROSEMIDE 10 MG/ML IJ SOLN
40.0000 mg | Freq: Once | INTRAMUSCULAR | Status: AC
  Administered 2013-07-09: 40 mg via INTRAVENOUS
  Filled 2013-07-09: qty 4

## 2013-07-09 MED ORDER — ACETAMINOPHEN 325 MG PO TABS: 650.0000 mg | ORAL_TABLET | Freq: Four times a day (QID) | ORAL | Status: DC | PRN

## 2013-07-09 MED ORDER — PROMETHAZINE HCL 25 MG PO TABS: 12.5000 mg | ORAL_TABLET | Freq: Four times a day (QID) | ORAL | Status: DC | PRN

## 2013-07-09 MED ORDER — NITROGLYCERIN 0.4 MG SL SUBL: 0.4000 mg | SUBLINGUAL_TABLET | SUBLINGUAL | Status: DC | PRN

## 2013-07-09 MED ORDER — IPRATROPIUM BROMIDE 0.02 % IN SOLN
0.5000 mg | Freq: Once | RESPIRATORY_TRACT | Status: AC
  Administered 2013-07-09: 0.5 mg via RESPIRATORY_TRACT
  Filled 2013-07-09: qty 2.5

## 2013-07-09 MED ORDER — FLEET ENEMA 7-19 GM/118ML RE ENEM: 1.0000 | ENEMA | Freq: Once | RECTAL | Status: AC | PRN

## 2013-07-09 MED ORDER — METHYLPREDNISOLONE SODIUM SUCC 125 MG IJ SOLR
60.0000 mg | Freq: Four times a day (QID) | INTRAMUSCULAR | Status: DC
  Administered 2013-07-09 – 2013-07-12 (×10): 60 mg via INTRAVENOUS
  Filled 2013-07-09 (×13): qty 0.96
  Filled 2013-07-09: qty 2
  Filled 2013-07-09 (×2): qty 0.96

## 2013-07-09 MED ORDER — ALBUTEROL SULFATE (5 MG/ML) 0.5% IN NEBU
5.0000 mg | INHALATION_SOLUTION | Freq: Once | RESPIRATORY_TRACT | Status: AC
  Administered 2013-07-09: 5 mg via RESPIRATORY_TRACT
  Filled 2013-07-09: qty 1

## 2013-07-09 MED ORDER — FUROSEMIDE 10 MG/ML IJ SOLN
40.0000 mg | Freq: Every day | INTRAMUSCULAR | Status: DC
  Administered 2013-07-10 – 2013-07-11 (×2): 40 mg via INTRAVENOUS
  Filled 2013-07-09 (×3): qty 4

## 2013-07-09 MED ORDER — AMIODARONE HCL 200 MG PO TABS
200.0000 mg | ORAL_TABLET | Freq: Every day | ORAL | Status: DC
  Administered 2013-07-09 – 2013-07-13 (×5): 200 mg via ORAL
  Filled 2013-07-09 (×5): qty 1

## 2013-07-09 MED ORDER — ALBUTEROL SULFATE (5 MG/ML) 0.5% IN NEBU
2.5000 mg | INHALATION_SOLUTION | Freq: Four times a day (QID) | RESPIRATORY_TRACT | Status: DC
  Administered 2013-07-09 – 2013-07-13 (×15): 2.5 mg via RESPIRATORY_TRACT
  Filled 2013-07-09 (×15): qty 0.5

## 2013-07-09 MED ORDER — DIGOXIN 125 MCG PO TABS
0.1250 mg | ORAL_TABLET | Freq: Every day | ORAL | Status: DC
  Administered 2013-07-09 – 2013-07-13 (×4): 0.125 mg via ORAL
  Filled 2013-07-09 (×5): qty 1

## 2013-07-09 MED ORDER — METHYLPREDNISOLONE SODIUM SUCC 125 MG IJ SOLR
125.0000 mg | Freq: Once | INTRAMUSCULAR | Status: AC
  Administered 2013-07-09: 125 mg via INTRAVENOUS
  Filled 2013-07-09: qty 2

## 2013-07-09 MED ORDER — GUAIFENESIN ER 600 MG PO TB12
1200.0000 mg | ORAL_TABLET | Freq: Two times a day (BID) | ORAL | Status: DC
  Administered 2013-07-09 – 2013-07-13 (×8): 1200 mg via ORAL
  Filled 2013-07-09 (×9): qty 2

## 2013-07-09 MED ORDER — ALUM & MAG HYDROXIDE-SIMETH 200-200-20 MG/5ML PO SUSP
30.0000 mL | Freq: Four times a day (QID) | ORAL | Status: DC | PRN
  Administered 2013-07-09 – 2013-07-12 (×5): 30 mL via ORAL
  Filled 2013-07-09 (×5): qty 30

## 2013-07-09 MED ORDER — LOSARTAN POTASSIUM 25 MG PO TABS
25.0000 mg | ORAL_TABLET | Freq: Every day | ORAL | Status: DC
  Administered 2013-07-09 – 2013-07-13 (×5): 25 mg via ORAL
  Filled 2013-07-09 (×5): qty 1

## 2013-07-09 MED ORDER — SODIUM CHLORIDE 0.9 % IJ SOLN
3.0000 mL | Freq: Two times a day (BID) | INTRAMUSCULAR | Status: DC
  Administered 2013-07-10 – 2013-07-12 (×4): 3 mL via INTRAVENOUS

## 2013-07-09 MED ORDER — ZOLPIDEM TARTRATE 5 MG PO TABS: 5.0000 mg | ORAL_TABLET | Freq: Every evening | ORAL | Status: DC | PRN

## 2013-07-09 MED ORDER — ALBUTEROL SULFATE (5 MG/ML) 0.5% IN NEBU: 2.5000 mg | INHALATION_SOLUTION | RESPIRATORY_TRACT | Status: DC | PRN

## 2013-07-09 MED ORDER — INSULIN ASPART 100 UNIT/ML ~~LOC~~ SOLN
0.0000 [IU] | Freq: Every day | SUBCUTANEOUS | Status: DC
  Administered 2013-07-09 – 2013-07-12 (×4): 2 [IU] via SUBCUTANEOUS

## 2013-07-09 MED ORDER — ISOSORBIDE MONONITRATE ER 60 MG PO TB24
60.0000 mg | ORAL_TABLET | Freq: Every day | ORAL | Status: DC
  Administered 2013-07-09 – 2013-07-13 (×5): 60 mg via ORAL
  Filled 2013-07-09 (×5): qty 1

## 2013-07-09 MED ORDER — TAMSULOSIN HCL 0.4 MG PO CAPS
0.4000 mg | ORAL_CAPSULE | Freq: Every day | ORAL | Status: DC
  Administered 2013-07-10 – 2013-07-13 (×4): 0.4 mg via ORAL
  Filled 2013-07-09 (×4): qty 1

## 2013-07-09 MED ORDER — VITAMIN D3 25 MCG (1000 UNIT) PO TABS
2000.0000 [IU] | ORAL_TABLET | Freq: Every day | ORAL | Status: DC
  Administered 2013-07-09 – 2013-07-13 (×5): 2000 [IU] via ORAL
  Filled 2013-07-09 (×5): qty 2

## 2013-07-09 MED ORDER — ATORVASTATIN CALCIUM 40 MG PO TABS
40.0000 mg | ORAL_TABLET | Freq: Every day | ORAL | Status: DC
  Administered 2013-07-09 – 2013-07-12 (×4): 40 mg via ORAL
  Filled 2013-07-09 (×5): qty 1

## 2013-07-09 MED ORDER — ACETAMINOPHEN 650 MG RE SUPP: 650.0000 mg | Freq: Four times a day (QID) | RECTAL | Status: DC | PRN

## 2013-07-09 MED ORDER — POTASSIUM CHLORIDE IN NACL 20-0.9 MEQ/L-% IV SOLN
INTRAVENOUS | Status: DC
  Administered 2013-07-09: 16:00:00 via INTRAVENOUS
  Filled 2013-07-09 (×2): qty 1000

## 2013-07-09 MED ORDER — SENNOSIDES-DOCUSATE SODIUM 8.6-50 MG PO TABS
2.0000 | ORAL_TABLET | Freq: Every day | ORAL | Status: DC
  Administered 2013-07-09: 2 via ORAL
  Administered 2013-07-10 – 2013-07-11 (×2): 1 via ORAL
  Filled 2013-07-09: qty 1
  Filled 2013-07-09 (×3): qty 2
  Filled 2013-07-09: qty 1
  Filled 2013-07-09: qty 2

## 2013-07-09 MED ORDER — INSULIN ASPART 100 UNIT/ML ~~LOC~~ SOLN
0.0000 [IU] | Freq: Three times a day (TID) | SUBCUTANEOUS | Status: DC
  Administered 2013-07-09: 3 [IU] via SUBCUTANEOUS
  Administered 2013-07-10: 2 [IU] via SUBCUTANEOUS
  Administered 2013-07-10 – 2013-07-11 (×3): 3 [IU] via SUBCUTANEOUS
  Administered 2013-07-11: 5 [IU] via SUBCUTANEOUS
  Administered 2013-07-11 – 2013-07-12 (×4): 3 [IU] via SUBCUTANEOUS
  Administered 2013-07-13: 2 [IU] via SUBCUTANEOUS

## 2013-07-09 MED ORDER — DABIGATRAN ETEXILATE MESYLATE 150 MG PO CAPS
150.0000 mg | ORAL_CAPSULE | Freq: Two times a day (BID) | ORAL | Status: DC
  Administered 2013-07-09 – 2013-07-13 (×9): 150 mg via ORAL
  Filled 2013-07-09 (×10): qty 1

## 2013-07-09 MED ORDER — INSULIN DETEMIR 100 UNIT/ML ~~LOC~~ SOLN
10.0000 [IU] | Freq: Every day | SUBCUTANEOUS | Status: DC
  Administered 2013-07-09 – 2013-07-12 (×4): 10 [IU] via SUBCUTANEOUS
  Filled 2013-07-09 (×5): qty 0.1

## 2013-07-09 MED ORDER — HYDROCODONE-ACETAMINOPHEN 5-325 MG PO TABS: 1.0000 | ORAL_TABLET | ORAL | Status: DC | PRN

## 2013-07-09 MED ORDER — ALBUTEROL SULFATE (5 MG/ML) 0.5% IN NEBU: 5.0000 mg | INHALATION_SOLUTION | Freq: Once | RESPIRATORY_TRACT | Status: DC

## 2013-07-09 MED ORDER — FINASTERIDE 5 MG PO TABS
5.0000 mg | ORAL_TABLET | Freq: Every day | ORAL | Status: DC
  Administered 2013-07-09 – 2013-07-13 (×5): 5 mg via ORAL
  Filled 2013-07-09 (×5): qty 1

## 2013-07-09 MED ORDER — IPRATROPIUM BROMIDE 0.02 % IN SOLN
0.5000 mg | Freq: Four times a day (QID) | RESPIRATORY_TRACT | Status: DC
  Administered 2013-07-09 – 2013-07-13 (×15): 0.5 mg via RESPIRATORY_TRACT
  Filled 2013-07-09 (×15): qty 2.5

## 2013-07-09 MED ORDER — BISOPROLOL FUMARATE 10 MG PO TABS
10.0000 mg | ORAL_TABLET | Freq: Every day | ORAL | Status: DC
  Administered 2013-07-09 – 2013-07-13 (×5): 10 mg via ORAL
  Filled 2013-07-09 (×5): qty 1

## 2013-07-09 MED ORDER — IPRATROPIUM BROMIDE 0.02 % IN SOLN: 0.5000 mg | Freq: Once | RESPIRATORY_TRACT | Status: DC

## 2013-07-09 NOTE — ED Provider Notes (Addendum)
CSN: [id number]     Arrival date & time 07/09/13  4098 History   First MD Initiated Contact with Patient 07/09/13 0801     Chief Complaint  Patient presents with  . Respiratory Distress   (Consider location/radiation/quality/duration/timing/severity/associated sxs/prior Treatment) The history is provided by the patient.  pt w hx copd, chf, pacemaker, presents via ems w increased dyspnea in past day. Occasional non prod cough. No sore throat, sinus congestion or other uri c/o. No fever or chills. Denies cp although felt tight in chest. No leg pain. Mild ankle/lower leg swelling. ?orthopnea, no pnd. Uses home neb 4x/day. No home o2. Compliant w normal meds, no recent change in meds. Sob constant, persistent. Mild relief w neb by ems. Symptoms mod-severe.    Past Medical History  Diagnosis Date  . Pneumonia 12/06/10    healthcare-associated/ left lower lobe  . COPD (chronic obstructive pulmonary disease)     severe stage IV  . Atrial fibrillation     on Coumadin with St. Jude single-chamber pacemaker  . Diabetes mellitus     type 2; s/p Prednisone therapy for pneumonia 12/2010  . Coronary artery disease     s/p anterior STEMI 09/2009; cath. revealed mid LAD 40%, distal LAD 100%- PTCA, prox. RCA 40%, mid. RCA 100% with good collateral filling of PDA; EF 25%; NSTEMI 07/2011 - PCI/DES 100% LCX  . ST elevation (STEMI) myocardial infarction 01//11/12    anterior  . CHF (congestive heart failure)     EF 25% on 09/26/10; EF 50-55% and grade 1 diastolic dysfunction on ECHO 08/2010   . Hypertension   . Hyperlipidemia   . AAA (abdominal aortic aneurysm)     3 cm infrarenal abdominal aortic aneurysm per aorta ultrasound 03/2010  . Osteoarthritis     s/p bilat hip arthroplasty  . Angina   . Ischemic cardiomyopathy     EF 35% LHC 11/12  . GERD (gastroesophageal reflux disease)   . Hypercholesteremia   . PVD (peripheral vascular disease)   . Anxiety    Past Surgical History  Procedure  Laterality Date  . Hip arthroplasty      s/p right 09/27/10 and s/p left 09/24/10  . Back surgery    . Knee surgery    . Pacemaker insertion  12/2010    St. Jude single-chamber   . Cardiac catheterization  09/2009, 06/2007    PTCA to distal LAD   Family History  Problem Relation Age of Onset  . Heart attack Mother 37  . Heart attack Father 82  . Cancer Other     siblings  . Heart attack Other    History  Substance Use Topics  . Smoking status: Former Smoker -- 2.00 packs/day for 50 years    Types: Cigarettes    Quit date: 09/02/2009  . Smokeless tobacco: Never Used     Comment: smoked for 88yrs quit for 63yrs started back & smoked for 10ys  . Alcohol Use: No     Comment: occ - alcohol    Review of Systems  Constitutional: Negative for fever.  HENT: Negative for sore throat.   Eyes: Negative for redness.  Respiratory: Positive for cough and shortness of breath.   Cardiovascular: Negative for chest pain and leg swelling.  Gastrointestinal: Negative for vomiting, abdominal pain and diarrhea.  Genitourinary: Negative for flank pain.  Musculoskeletal: Negative for back pain and neck pain.  Skin: Negative for rash.  Neurological: Negative for headaches.  Hematological: Does not bruise/bleed easily.  Psychiatric/Behavioral: Negative for confusion.    Allergies  Review of patient's allergies indicates no known allergies.  Home Medications   Current Outpatient Rx  Name  Route  Sig  Dispense  Refill  . EXPIRED: amiodarone (PACERONE) 200 MG tablet   Oral   Take 1 tablet (200 mg total) by mouth daily.   30 tablet   1   . amoxicillin-clavulanate (AUGMENTIN) 875-125 MG per tablet   Oral   Take 1 tablet by mouth 2 (two) times daily.         . beclomethasone (QVAR) 80 MCG/ACT inhaler   Inhalation   Inhale 1 puff into the lungs 2 (two) times daily.         . bisoprolol (ZEBETA) 10 MG tablet   Oral   Take 1 tablet (10 mg total) by mouth daily.   30 tablet   3    . cholecalciferol (VITAMIN D) 1000 UNITS tablet   Oral   Take 2,000 Units by mouth daily.         . dabigatran (PRADAXA) 150 MG CAPS   Oral   Take 150 mg by mouth every 12 (twelve) hours.         . digoxin (LANOXIN) 0.125 MG tablet   Oral   Take 0.125 mg by mouth daily.         . finasteride (PROSCAR) 5 MG tablet   Oral   Take 5 mg by mouth daily.         . furosemide (LASIX) 20 MG tablet   Oral   Take 20 mg by mouth daily. TAKE one daily         . guaiFENesin (MUCINEX) 600 MG 12 hr tablet   Oral   Take 1,200 mg by mouth 2 (two) times daily.         Marland Kitchen ipratropium-albuterol (DUONEB) 0.5-2.5 (3) MG/3ML SOLN   Nebulization   Take 3 mLs by nebulization every 6 (six) hours.         . isosorbide mononitrate (IMDUR) 60 MG 24 hr tablet   Oral   Take 60 mg by mouth daily.           Marland Kitchen losartan (COZAAR) 25 MG tablet   Oral   Take 1 tablet (25 mg total) by mouth daily.   30 tablet   6   . nitroGLYCERIN (NITROSTAT) 0.4 MG SL tablet   Sublingual   Place 0.4 mg under the tongue every 5 (five) minutes as needed. For chest pain         . polyethylene glycol (MIRALAX / GLYCOLAX) packet   Oral   Take 17 g by mouth daily as needed.   30 packet   3   . predniSONE (DELTASONE) 10 MG tablet   Oral   Take 5 mg by mouth daily.         . rosuvastatin (CRESTOR) 20 MG tablet   Oral   Take 20 mg by mouth daily.         . sennosides-docusate sodium (SENOKOT-S) 8.6-50 MG tablet   Oral   Take 2 tablets by mouth at bedtime.          . sitaGLIPtin (JANUVIA) 25 MG tablet   Oral   Take 25 mg by mouth daily.         . Tamsulosin HCl (FLOMAX) 0.4 MG CAPS   Oral   Take 0.4 mg by mouth daily.          BP 117/74  Pulse 51  Resp 18  SpO2 99% Physical Exam  Nursing note and vitals reviewed. Constitutional: He is oriented to person, place, and time. He appears well-developed and well-nourished. No distress.  HENT:  Head: Atraumatic.  Mouth/Throat:  Oropharynx is clear and moist.  Eyes: Pupils are equal, round, and reactive to light.  Neck: Neck supple. No JVD present. No tracheal deviation present.  Cardiovascular: Normal rate, regular rhythm, normal heart sounds and intact distal pulses.   Pulmonary/Chest: Effort normal. No accessory muscle usage. No respiratory distress.  Wheezing bil  Abdominal: Soft. He exhibits no distension. There is no tenderness.  Musculoskeletal: Normal range of motion. He exhibits edema. He exhibits no tenderness.  Bilateral ankle/lower leg edema, mild.   Neurological: He is alert and oriented to person, place, and time.  Skin: Skin is warm and dry.  Psychiatric: He has a normal mood and affect.    ED Course  Procedures (including critical care time)  EKG Interpretation     Ventricular Rate:  59 PR Interval:  360 QRS Duration: 130 QT Interval:  679 QTC Calculation: 673 R Axis:   162 Text Interpretation:  Ventricular-paced complexes            MDM  Iv ns. Solumedrol iv. Albuterol and atrovent neb.  Pt previously received 2 albuterol nebs, 1 atrovent, by ems prior to arrival.  Cxr. Labs.  Room air pulse ox 88%, on 2 liters 98%.  Reviewed nursing notes and prior charts for additional history.   Vascular congestion on cxr w elev bnp, lasix 40 mg iv.  Additional alb neb.  Given dyspnea, likely multifactorial, copd w mild chf, hypoxia, room air pulse ox 88% - primary care service called to admit. Discussed w Dr Eloise Harman, will admit.   w any movement, or lying flat, o2 sats drop to low/mid 80's w marked increased dyspnea.  Recheck persistent wheezing despite muliple nebs, although air exchange improved from prior.   Pt reclined bed to urinate. Markedly dyspneic/hypoxic. Pt elevate to 90%, bil bs. Pulse ox mid to upper 80's. Given increased dyspnea, chf, resp therapy, bipap.  Additional neb txs.   Repeat ecf, pacemaker rhythm, occ pvc.   CRITICAL CARE  RE respiratory distress, acute  on chronic resp failure,  Copd, chf, hypoxia. Performed by: Suzi Roots Total critical care time: 35 Critical care time was exclusive of separately billable procedures and treating other patients. Critical care was necessary to treat or prevent imminent or life-threatening deterioration. Critical care was time spent personally by me on the following activities: development of treatment plan with patient and/or surrogate as well as nursing, discussions with consultants, evaluation of patient's response to treatment, examination of patient, obtaining history from patient or surrogate, ordering and performing treatments and interventions, ordering and review of laboratory studies, ordering and review of radiographic studies, pulse oximetry and re-evaluation of patient's condition.  Pt is already on anticoag/pradaxa.   No cp or discomfort on recheck.  Awaiting pcp/inpt bed.    Suzi Roots, MD 07/09/13 1240

## 2013-07-09 NOTE — ED Notes (Signed)
Pt wanted to take of bipap, no acute distress, states he feels better. EDP informed. Trialed nasal cannula at 6L, pt tolerated well. Pt on  at 5L/min.

## 2013-07-09 NOTE — H&P (Signed)
[name] is an 79 y.o. male.   Chief Complaint: I cannot breath HPI:  The patient is an 79 year old man with severe COPD who was doing well until exposed to a heavy perfume scent yesterday afternoon. He developed progressively worsening DOE overnight that was present at rest this morning leading to an ER evaluation. He had not had significant chest pain, palpitations, productive cough, fever, or chills. He did have some orthopnea. Workup in the ER was significant for hypoxia with minimal activity, a clear chest X-ray, and labs with an elevated pro-BNP level. He was started on frequent nebs and IV solumedrol several hours ago and already feels much better and is able to easily converse with nasal cannula oxygen in place. He has requested a DNR order. Past Medical History  Diagnosis Date  . Pneumonia 12/06/10    healthcare-associated/ left lower lobe  . COPD (chronic obstructive pulmonary disease)     severe stage IV  . Atrial fibrillation     on Coumadin with St. Jude single-chamber pacemaker  . Diabetes mellitus     type 2; s/p Prednisone therapy for pneumonia 12/2010  . Coronary artery disease     s/p anterior STEMI 09/2009; cath. revealed mid LAD 40%, distal LAD 100%- PTCA, prox. RCA 40%, mid. RCA 100% with good collateral filling of PDA; EF 25%; NSTEMI 07/2011 - PCI/DES 100% LCX  . ST elevation (STEMI) myocardial infarction 01//11/12    anterior  . CHF (congestive heart failure)     EF 25% on 09/26/10; EF 50-55% and grade 1 diastolic dysfunction on ECHO 08/2010   . Hypertension   . Hyperlipidemia   . AAA (abdominal aortic aneurysm)     3 cm infrarenal abdominal aortic aneurysm per aorta ultrasound 03/2010  . Osteoarthritis     s/p bilat hip arthroplasty  . Angina   . Ischemic cardiomyopathy     EF 35% LHC 11/12  . GERD (gastroesophageal reflux disease)   . Hypercholesteremia   . PVD (peripheral vascular disease)   . Anxiety     Medications Prior to Admission  Medication  Sig Dispense Refill  . amoxicillin-clavulanate (AUGMENTIN) 875-125 MG per tablet Take 1 tablet by mouth 2 (two) times daily.      . beclomethasone (QVAR) 80 MCG/ACT inhaler Inhale 1 puff into the lungs 2 (two) times daily.      . sennosides-docusate sodium (SENOKOT-S) 8.6-50 MG tablet Take 2 tablets by mouth at bedtime.       Marland Kitchen amiodarone (PACERONE) 200 MG tablet Take 1 tablet (200 mg total) by mouth daily.  30 tablet  1  . bisoprolol (ZEBETA) 10 MG tablet Take 1 tablet (10 mg total) by mouth daily.  30 tablet  3  . cholecalciferol (VITAMIN D) 1000 UNITS tablet Take 2,000 Units by mouth daily.      . dabigatran (PRADAXA) 150 MG CAPS Take 150 mg by mouth every 12 (twelve) hours.      . digoxin (LANOXIN) 0.125 MG tablet Take 0.125 mg by mouth daily.      . finasteride (PROSCAR) 5 MG tablet Take 5 mg by mouth daily.      . furosemide (LASIX) 20 MG tablet Take 20 mg by mouth daily. TAKE one daily      . guaiFENesin (MUCINEX) 600 MG 12 hr tablet Take 1,200 mg by mouth 2 (two) times daily.      Marland Kitchen ipratropium-albuterol (DUONEB) 0.5-2.5 (3) MG/3ML SOLN Take 3 mLs by nebulization every 6 (six) hours.      Marland Kitchen  isosorbide mononitrate (IMDUR) 60 MG 24 hr tablet Take 60 mg by mouth daily.        Marland Kitchen losartan (COZAAR) 25 MG tablet Take 1 tablet (25 mg total) by mouth daily.  30 tablet  6  . nitroGLYCERIN (NITROSTAT) 0.4 MG SL tablet Place 0.4 mg under the tongue every 5 (five) minutes as needed. For chest pain      . predniSONE (DELTASONE) 10 MG tablet Take 5 mg by mouth daily.      . rosuvastatin (CRESTOR) 20 MG tablet Take 20 mg by mouth daily.      . sitaGLIPtin (JANUVIA) 25 MG tablet Take 25 mg by mouth daily.      . Tamsulosin HCl (FLOMAX) 0.4 MG CAPS Take 0.4 mg by mouth daily.        ADDITIONAL HOME MEDICATIONS: no additional meds  PHYSICIANS INVOLVED IN CARE: Jenelle Mages (PCP), Ronny Flurry (card), Kalman Shan (pulm)  Past Surgical History  Procedure Laterality Date  . Hip arthroplasty       s/p right 09/27/10 and s/p left 09/24/10  . Back surgery    . Knee surgery    . Pacemaker insertion  12/2010    St. Jude single-chamber   . Cardiac catheterization  09/2009, 06/2007    PTCA to distal LAD    Family History  Problem Relation Age of Onset  . Heart attack Mother 29  . Heart attack Father 55  . Cancer Other     siblings  . Heart attack Other      Social History:  reports that he quit smoking about 3 years ago. His smoking use included Cigarettes. He has a 100 pack-year smoking history. He has never used smokeless tobacco. He reports that he does not drink alcohol or use illicit drugs.  Allergies: No Known Allergies   ROS: arthritis, chest pain, emphysema, high blood pressure and shortness of breath  PHYSICAL EXAM: Blood pressure 144/77, pulse 62, temperature 98.8 F (37.1 C), temperature source Oral, resp. rate 25, height 5\' 9"  (1.753 m), weight 80.8 kg (178 lb 2.1 oz), SpO2 98.00%. In general, the patient is an elderly white man who was in no apparent distress on nasal cannula oxygen lying at 10 degree elevation of the HOB, HEENT exam was normal, Neck was supple and without JVD or carotid bruit, Chest had hyperexpanded lungs with very poor expiratory airflow and no wheezing, heart had a slightly irregular rhythm without significant murmur, abdomen had normal bowel sounds and no tenderness, extremities had no edema and normal pulses, neurologic exam was nonfocal.  Results for orders placed during the hospital encounter of 07/09/13 (from the past 48 hour(s))  CBC     Status: Abnormal   Collection Time    07/09/13  8:05 AM      Result Value Range   WBC 10.6 (*) 4.0 - 10.5 K/uL   RBC 4.45  4.22 - 5.81 MIL/uL   Hemoglobin 13.0  13.0 - 17.0 g/dL   HCT 91.4  78.2 - 95.6 %   MCV 93.3  78.0 - 100.0 fL   MCH 29.2  26.0 - 34.0 pg   MCHC 31.3  30.0 - 36.0 g/dL   RDW 21.3  08.6 - 57.8 %   Platelets 138 (*) 150 - 400 K/uL  COMPREHENSIVE METABOLIC PANEL     Status:  Abnormal   Collection Time    07/09/13  8:05 AM      Result Value Range   Sodium 136  135 - 145 mEq/L   Potassium 4.5  3.5 - 5.1 mEq/L   Chloride 102  96 - 112 mEq/L   CO2 23  19 - 32 mEq/L   Glucose, Bld 144 (*) 70 - 99 mg/dL   BUN 22  6 - 23 mg/dL   Creatinine, Ser 1.61  0.50 - 1.35 mg/dL   Calcium 9.2  8.4 - 09.6 mg/dL   Total Protein 6.3  6.0 - 8.3 g/dL   Albumin 3.8  3.5 - 5.2 g/dL   AST 23  0 - 37 U/L   ALT 30  0 - 53 U/L   Alkaline Phosphatase 89  39 - 117 U/L   Total Bilirubin 0.5  0.3 - 1.2 mg/dL   GFR calc non Af Amer 66 (*) >90 mL/min   GFR calc Af Amer 76 (*) >90 mL/min   Comment: (NOTE)     The eGFR has been calculated using the CKD EPI equation.     This calculation has not been validated in all clinical situations.     eGFR's persistently <90 mL/min signify possible Chronic Kidney     Disease.  TROPONIN I     Status: None   Collection Time    07/09/13  8:05 AM      Result Value Range   Troponin I <0.30  <0.30 ng/mL   Comment:            Due to the release kinetics of cTnI,     a negative result within the first hours     of the onset of symptoms does not rule out     myocardial infarction with certainty.     If myocardial infarction is still suspected,     repeat the test at appropriate intervals.  DIGOXIN LEVEL     Status: None   Collection Time    07/09/13  8:05 AM      Result Value Range   Digoxin Level 0.9  0.8 - 2.0 ng/mL  PRO B NATRIURETIC PEPTIDE     Status: Abnormal   Collection Time    07/09/13  8:10 AM      Result Value Range   Pro B Natriuretic peptide (BNP) 2523.0 (*) 0 - 450 pg/mL  GLUCOSE, CAPILLARY     Status: Abnormal   Collection Time    07/09/13 12:09 PM      Result Value Range   Glucose-Capillary 162 (*) 70 - 99 mg/dL  GLUCOSE, CAPILLARY     Status: Abnormal   Collection Time    07/09/13  4:09 PM      Result Value Range   Glucose-Capillary 171 (*) 70 - 99 mg/dL   Comment 1 Documented in Chart     Comment 2 Notify RN     Dg  Chest 2 View  07/09/2013   CLINICAL DATA:  shortness of breath  EXAM: CHEST  2 VIEW  COMPARISON:  September 27, 2012  FINDINGS: There is underlying emphysema. There is scarring in the right base. The interstitium is mildly prominent overall but stable. There is no airspace consolidation.  There is moderate cardiomegaly. The pulmonary vascularity is felt to show evidence of a degree of volume overload given underlying emphysema. There is atherosclerotic change in the aorta. No adenopathy. Pacemaker lead is attached to the right atrium.  IMPRESSION: Suspect a degree of chronic congestive heart failure superimposed on emphysema. No consolidation. No new opacity compared to prior study.   Electronically Signed   By: Chrissie Noa  Margarita Grizzle M.D.   On: 07/09/2013 08:43     Assessment/Plan #1 Dyspnea: primarily from an exacerbation of known severe COPD, and less likely due to CHF given his lung exam without crackles and the rapid improvement in sxs on current meds. Pneumonia is unlikely. Will continue high dose steroids and nebs, expect significant improvement overnight. #2 CHF:  He had an elevated BNP level likely due to mild acute CHF from mixed systolic and diastolic dysfunction. Will check echo and continue current meds, recheck BNP tomorrow AM.  #3 DM2:  Stable on SSI prn and he may need a higher dose.  #4 Atrial Fibrillation: stable ventricular rate with stroke prevention via pradaxa.   Vicktoria Muckey G 07/09/2013, 7:33 PM

## 2013-07-09 NOTE — Progress Notes (Signed)
Clinical Social Work Department BRIEF PSYCHOSOCIAL ASSESSMENT 07/09/2013  Patient:  [name], [name]     Account Number:  [id number]     Admit date:  07/09/2013  Clinical Social Worker:  Doree Albee  Date/Time:  07/09/2013 01:30 PM  Referred by:  Physician  Date Referred:  07/09/2013 Referred for  ALF Placement   Other Referral:   Interview type:  Patient Other interview type:   and patient daughter, [name]    PSYCHOSOCIAL DATA Living Status:  FACILITY Admitted from facility:  [facility] Level of care:  Assisted Living Primary support name:  [name] Primary support relationship to patient:  CHILD, ADULT Degree of support available:   strong, and HCPOA    CURRENT CONCERNS Current Concerns  Post-Acute Placement   Other Concerns:    SOCIAL WORK ASSESSMENT / PLAN CSW met with pt at bedside to complete psychosocial assessment. Pt intorduced pt daughter, and gave permission to complete assessment with pt daughter present. Pt shared that he is a resident at [facility] assisted living and plans to return when medically stable.  Pt shared that pt daughter, [name] is patient HCPOA.    CSW and pt discussed that patient will probably been seen by physical therapy while in the hosptial. CSW explained that unit csw will help coordinate patient return to his ALF, and assist with any further needs. CSW explained that most of the time patients are able to return, however someimtes patients may need skilled nursing for short term rehab. CSW explained that physical therapy will help identify those needs if there is a concern.    CSW will complete fl2 and place on pt chart for MD signature once completed.   Assessment/plan status:  Psychosocial Support/Ongoing Assessment of Needs Other assessment/ plan:   Information/referral to community resources:   none identified at this time    PATIENT'S/FAMILY'S RESPONSE TO PLAN OF CARE: Patient and patient daughter  thanked csw for concern and support. Pt is hopeful to return to [facility] when medically stable.   Frutoso Schatz [phone]  ED CSW 07/09/2013 1333pm

## 2013-07-09 NOTE — ED Notes (Signed)
pts on 2 L [place] O2, pts O2 stat dropped to 86%, placed on 3 L [place] O2. Pt clammy and using accessory muscles. md made aware.

## 2013-07-09 NOTE — Progress Notes (Signed)
*  PRELIMINARY RESULTS* Echocardiogram 2D Echocardiogram has been performed.  [name] 07/09/2013, 4:36 PM

## 2013-07-09 NOTE — ED Notes (Signed)
Respiratory at bedside.

## 2013-07-09 NOTE — ED Notes (Signed)
Respiratory called about order. Stated are on their way. RN Fleet Contras notified.

## 2013-07-09 NOTE — ED Notes (Signed)
Bed: RESB Expected date:  Expected time:  Means of arrival:  Comments: ems- 79 yo M, respiratory distress, getting breathing treatment, 99% now

## 2013-07-09 NOTE — ED Notes (Signed)
Per ems pt is from [facility], pt reports called ems for SOB and not being able to breathe. Lung sounds diminished and expiratory wheezing, nebulizer 5mg  albuterol and 0.5mg  atrovent.

## 2013-07-10 LAB — COMPREHENSIVE METABOLIC PANEL
ALT: 21 U/L (ref 0–53)
AST: 20 U/L (ref 0–37)
Albumin: 3.3 g/dL — ABNORMAL LOW (ref 3.5–5.2)
CO2: 28 mEq/L (ref 19–32)
Calcium: 8.7 mg/dL (ref 8.4–10.5)
Chloride: 104 mEq/L (ref 96–112)
GFR calc Af Amer: 87 mL/min — ABNORMAL LOW (ref >90)
GFR calc non Af Amer: 75 mL/min — ABNORMAL LOW (ref >90)
Glucose, Bld: 182 mg/dL — ABNORMAL HIGH (ref 70–99)
Sodium: 139 mEq/L (ref 135–145)
Total Bilirubin: 0.6 mg/dL (ref 0.3–1.2)

## 2013-07-10 LAB — CBC
Hemoglobin: 12.1 g/dL — ABNORMAL LOW (ref 13.0–17.0)
MCH: 29.3 pg (ref 26.0–34.0)
MCHC: 31.9 g/dL (ref 30.0–36.0)
Platelets: 115 10*3/uL — ABNORMAL LOW (ref 150–400)
RDW: 14.9 % (ref 11.5–15.5)
WBC: 7.2 10*3/uL (ref 4.0–10.5)

## 2013-07-10 LAB — GLUCOSE, CAPILLARY
Glucose-Capillary: 155 mg/dL — ABNORMAL HIGH (ref 70–99)
Glucose-Capillary: 168 mg/dL — ABNORMAL HIGH (ref 70–99)

## 2013-07-10 MED ORDER — CHLORHEXIDINE GLUCONATE CLOTH 2 % EX PADS
6.0000 | MEDICATED_PAD | Freq: Every day | CUTANEOUS | Status: DC
  Administered 2013-07-10 – 2013-07-13 (×4): 6 via TOPICAL

## 2013-07-10 MED ORDER — MUPIROCIN 2 % EX OINT
1.0000 "application " | TOPICAL_OINTMENT | Freq: Two times a day (BID) | CUTANEOUS | Status: DC
  Administered 2013-07-10 – 2013-07-13 (×7): 1 via NASAL
  Filled 2013-07-10: qty 22

## 2013-07-10 NOTE — Progress Notes (Signed)
[name] 1240 TRANSFERRED TO 1501- REPORT CALLED TO ANITA RN. NO CHANGE IN PATIENT'S CONDITION.

## 2013-07-10 NOTE — Progress Notes (Signed)
Subjective: breathing is much improved today, ate some breakfast, slept well  Objective: Vital signs in last 24 hours: Temp:  [97.4 F (36.3 C)-98.8 F (37.1 C)] 97.4 F (36.3 C) (10/25 0800) Pulse Rate:  [57-65] 57 (10/25 0450) Resp:  [13-32] 17 (10/25 0450) BP: (97-159)/(41-77) 139/54 mmHg (10/25 0450) SpO2:  [90 %-100 %] 99 % (10/25 0450) Weight:  [80.3 kg (177 lb 0.5 oz)-80.8 kg (178 lb 2.1 oz)] 80.3 kg (177 lb 0.5 oz) (10/25 0400) Weight change:    Intake/Output from previous day: 10/24 0701 - 10/25 0700 In: 368.8 [P.O.:240; I.V.:128.8] Out: 650 [Urine:650]   General appearance: alert, cooperative and no distress Resp: bilateral scattered wheezing, no use of accessory muscles of respiration Cardio: regular rate and rhythm Extremities: extremities normal, atraumatic, no cyanosis or edema Neurologic: Grossly normal  Lab Results:  Recent Labs  07/09/13 0805 07/10/13 0321  WBC 10.6* 7.2  HGB 13.0 12.1*  HCT 41.5 37.9*  PLT 138* 115*   BMET  Recent Labs  07/09/13 0805 07/10/13 0321  NA 136 139  K 4.5 4.6  CL 102 104  CO2 23 28  GLUCOSE 144* 182*  BUN 22 21  CREATININE 1.02 0.94  CALCIUM 9.2 8.7   CMET CMP     Component Value Date/Time   NA 139 07/10/2013 0321   K 4.6 07/10/2013 0321   CL 104 07/10/2013 0321   CO2 28 07/10/2013 0321   GLUCOSE 182* 07/10/2013 0321   BUN 21 07/10/2013 0321   CREATININE 0.94 07/10/2013 0321   CALCIUM 8.7 07/10/2013 0321   PROT 5.8* 07/10/2013 0321   ALBUMIN 3.3* 07/10/2013 0321   AST 20 07/10/2013 0321   ALT 21 07/10/2013 0321   ALKPHOS 75 07/10/2013 0321   BILITOT 0.6 07/10/2013 0321   GFRNONAA 75* 07/10/2013 0321   GFRAA 87* 07/10/2013 0321    CBG (last 3)   Recent Labs  07/09/13 1609 07/09/13 2155 07/10/13 0817  GLUCAP 171* 219* 144*    INR RESULTS:   Lab Results  Component Value Date   INR 2.03* 09/28/2012   INR 2.43* 09/27/2012   INR 2.39* 04/14/2012     Studies/Results: Dg Chest 2  View  07/09/2013   CLINICAL DATA:  shortness of breath  EXAM: CHEST  2 VIEW  COMPARISON:  September 27, 2012  FINDINGS: There is underlying emphysema. There is scarring in the right base. The interstitium is mildly prominent overall but stable. There is no airspace consolidation.  There is moderate cardiomegaly. The pulmonary vascularity is felt to show evidence of a degree of volume overload given underlying emphysema. There is atherosclerotic change in the aorta. No adenopathy. Pacemaker lead is attached to the right atrium.  IMPRESSION: Suspect a degree of chronic congestive heart failure superimposed on emphysema. No consolidation. No new opacity compared to prior study.   Electronically Signed   By: Bretta Bang M.D.   On: 07/09/2013 08:43    Medications: I have reviewed the patient's current medications.  Assessment/Plan: #1 Dyspnea: much improved sxs, and now wheezing with a higher expiratory airflow, will transfer to a regular medical bed #2 Hyperglycemia: stable on SSI prn #3 CHF: mild and clinically improved, rechecking BNP today on lasix IV daily   LOS: 1 day   Nikcole Eischeid G 07/10/2013, 10:04 AM

## 2013-07-11 LAB — GLUCOSE, CAPILLARY
Glucose-Capillary: 233 mg/dL — ABNORMAL HIGH (ref 70–99)
Glucose-Capillary: 184 mg/dL — ABNORMAL HIGH (ref 70–99)
Glucose-Capillary: 179 mg/dL — ABNORMAL HIGH (ref 70–99)
Glucose-Capillary: 204 mg/dL — ABNORMAL HIGH (ref 70–99)
Glucose-Capillary: 242 mg/dL — ABNORMAL HIGH (ref 70–99)

## 2013-07-11 NOTE — Progress Notes (Signed)
Subjective: No dyspnea at rest on [place] oxygen, eating well, no chest discomfort  Objective: Vital signs in last 24 hours: Temp:  [91.1 F (32.8 C)-98.3 F (36.8 C)] 98.3 F (36.8 C) (10/26 0553) Pulse Rate:  [54-66] 55 (10/26 0553) Resp:  [15-23] 18 (10/26 0553) BP: (124-152)/(52-72) 126/69 mmHg (10/26 0553) SpO2:  [94 %-99 %] 94 % (10/26 0553) Weight change:    Intake/Output from previous day: 10/25 0701 - 10/26 0700 In: 470 [P.O.:360; I.V.:110] Out: 2300 [Urine:2300]   General appearance: alert, cooperative and no distress Resp: bilateral scattered wheezing and no use of accessory muscles of respiration Cardio: regular rate and rhythm GI: soft, non-tender; bowel sounds normal; no masses,  no organomegaly Extremities: extremities normal, atraumatic, no cyanosis or edema  Lab Results:  Recent Labs  07/09/13 0805 07/10/13 0321  WBC 10.6* 7.2  HGB 13.0 12.1*  HCT 41.5 37.9*  PLT 138* 115*   BMET  Recent Labs  07/09/13 0805 07/10/13 0321  NA 136 139  K 4.5 4.6  CL 102 104  CO2 23 28  GLUCOSE 144* 182*  BUN 22 21  CREATININE 1.02 0.94  CALCIUM 9.2 8.7   CMET CMP     Component Value Date/Time   NA 139 07/10/2013 0321   K 4.6 07/10/2013 0321   CL 104 07/10/2013 0321   CO2 28 07/10/2013 0321   GLUCOSE 182* 07/10/2013 0321   BUN 21 07/10/2013 0321   CREATININE 0.94 07/10/2013 0321   CALCIUM 8.7 07/10/2013 0321   PROT 5.8* 07/10/2013 0321   ALBUMIN 3.3* 07/10/2013 0321   AST 20 07/10/2013 0321   ALT 21 07/10/2013 0321   ALKPHOS 75 07/10/2013 0321   BILITOT 0.6 07/10/2013 0321   GFRNONAA 75* 07/10/2013 0321   GFRAA 87* 07/10/2013 0321    CBG (last 3)   Recent Labs  07/10/13 1209 07/10/13 1611 07/10/13 2211  GLUCAP 155* 168* 233*    INR RESULTS:   Lab Results  Component Value Date   INR 2.03* 09/28/2012   INR 2.43* 09/27/2012   INR 2.39* 04/14/2012     Studies/Results: Dg Chest 2 View  07/09/2013   CLINICAL DATA:  shortness of breath   EXAM: CHEST  2 VIEW  COMPARISON:  September 27, 2012  FINDINGS: There is underlying emphysema. There is scarring in the right base. The interstitium is mildly prominent overall but stable. There is no airspace consolidation.  There is moderate cardiomegaly. The pulmonary vascularity is felt to show evidence of a degree of volume overload given underlying emphysema. There is atherosclerotic change in the aorta. No adenopathy. Pacemaker lead is attached to the right atrium.  IMPRESSION: Suspect a degree of chronic congestive heart failure superimposed on emphysema. No consolidation. No new opacity compared to prior study.   Electronically Signed   By: Bretta Bang M.D.   On: 07/09/2013 08:43    Medications: I have reviewed the patient's current medications.  Assessment/Plan: #1 Dyspnea: primarily due to COPD exacerbation, and improving on current rx. Will have him ambulate with a walker, oxygen, and assistance today, and likely discharge to ALF tomorrow. #2 DM2: stable on current meds #3 CHF: stable on current meds.   LOS: 2 days   [name] G 07/11/2013, 7:43 AM

## 2013-07-12 LAB — BASIC METABOLIC PANEL
BUN: 25 mg/dL — ABNORMAL HIGH (ref 6–23)
CO2: 31 mEq/L (ref 19–32)
Calcium: 8.5 mg/dL (ref 8.4–10.5)
Creatinine, Ser: 0.76 mg/dL (ref 0.50–1.35)
GFR calc Af Amer: >90 mL/min (ref >90)
GFR calc non Af Amer: 82 mL/min — ABNORMAL LOW (ref >90)
Glucose, Bld: 163 mg/dL — ABNORMAL HIGH (ref 70–99)
Sodium: 135 mEq/L (ref 135–145)

## 2013-07-12 LAB — CBC
Hemoglobin: 13.5 g/dL (ref 13.0–17.0)
MCH: 29.2 pg (ref 26.0–34.0)
MCHC: 31.4 g/dL (ref 30.0–36.0)
MCV: 92.9 fL (ref 78.0–100.0)
Platelets: 147 10*3/uL — ABNORMAL LOW (ref 150–400)
RDW: 14.9 % (ref 11.5–15.5)

## 2013-07-12 LAB — GLUCOSE, CAPILLARY
Glucose-Capillary: 156 mg/dL — ABNORMAL HIGH (ref 70–99)
Glucose-Capillary: 209 mg/dL — ABNORMAL HIGH (ref 70–99)

## 2013-07-12 MED ORDER — PREDNISONE 20 MG PO TABS
40.0000 mg | ORAL_TABLET | Freq: Every day | ORAL | Status: DC
  Administered 2013-07-12 – 2013-07-13 (×2): 40 mg via ORAL
  Filled 2013-07-12 (×3): qty 2

## 2013-07-12 MED ORDER — FUROSEMIDE 20 MG PO TABS
20.0000 mg | ORAL_TABLET | Freq: Every day | ORAL | Status: DC
  Administered 2013-07-12 – 2013-07-13 (×2): 20 mg via ORAL
  Filled 2013-07-12 (×2): qty 1

## 2013-07-12 NOTE — Progress Notes (Signed)
Subjective: Feeling better.  Breathing improved with ability to ambulate to bathroom without significant shortness of breath.  Objective: Vital signs in last 24 hours: Temp:  [97.5 F (36.4 C)-98 F (36.7 C)] 97.5 F (36.4 C) (10/27 1610) Pulse Rate:  [56-64] 60 (10/27 0608) Resp:  [14-18] 18 (10/27 0608) BP: (122-134)/(56-73) 134/73 mmHg (10/27 0608) SpO2:  [93 %-99 %] 99 % (10/27 0608) Weight change:  Last BM Date: 07/11/13  CBG (last 3)   Recent Labs  07/11/13 1128 07/11/13 1707 07/11/13 2205  GLUCAP 179* 204* 242*    Intake/Output from previous day: 10/26 0701 - 10/27 0700 In: 846.2 [P.O.:480; I.V.:366.2] Out: 1250 [Urine:1250] Intake/Output this shift:    General appearance: alert and no distress Eyes: no scleral icterus Throat: oropharynx moist without erythema Resp: minimal bilateral expiratory wheezing throughout Cardio: regular rate and rhythm GI: soft, non-tender; bowel sounds normal; no masses,  no organomegaly Extremities: no clubbing, cyanosis or edema   Lab Results:  Recent Labs  07/09/13 0805 07/10/13 0321  NA 136 139  K 4.5 4.6  CL 102 104  CO2 23 28  GLUCOSE 144* 182*  BUN 22 21  CREATININE 1.02 0.94  CALCIUM 9.2 8.7    Recent Labs  07/09/13 0805 07/10/13 0321  AST 23 20  ALT 30 21  ALKPHOS 89 75  BILITOT 0.5 0.6  PROT 6.3 5.8*  ALBUMIN 3.8 3.3*    Recent Labs  07/09/13 0805 07/10/13 0321  WBC 10.6* 7.2  HGB 13.0 12.1*  HCT 41.5 37.9*  MCV 93.3 91.8  PLT 138* 115*   Lab Results  Component Value Date   INR 2.03* 09/28/2012   INR 2.43* 09/27/2012   INR 2.39* 04/14/2012    Recent Labs  07/09/13 0805  TROPONINI <0.30   BNP (last 3 results)  Recent Labs  09/27/12 0534 07/09/13 0810 07/10/13 0321  PROBNP 1696.0* 2523.0* 4359.0*    Studies/Results: No results found.   Medications: Scheduled: . albuterol  2.5 mg Nebulization QID  . albuterol  5 mg Nebulization Once  . amiodarone  200 mg Oral Daily  .  atorvastatin  40 mg Oral q1800  . bisoprolol  10 mg Oral Daily  . Chlorhexidine Gluconate Cloth  6 each Topical Q0600  . cholecalciferol  2,000 Units Oral Daily  . dabigatran  150 mg Oral Q12H  . digoxin  0.125 mg Oral Daily  . finasteride  5 mg Oral Daily  . furosemide  40 mg Intravenous Daily  . guaiFENesin  1,200 mg Oral BID  . insulin aspart  0-15 Units Subcutaneous TID WC  . insulin aspart  0-5 Units Subcutaneous QHS  . insulin detemir  10 Units Subcutaneous QHS  . ipratropium  0.5 mg Nebulization Once  . ipratropium  0.5 mg Nebulization Q6H  . isosorbide mononitrate  60 mg Oral Daily  . losartan  25 mg Oral Daily  . methylPREDNISolone (SOLU-MEDROL) injection  60 mg Intravenous Q6H  . mupirocin ointment  1 application Nasal BID  . senna-docusate  2 tablet Oral QHS  . sodium chloride  3 mL Intravenous Q12H  . tamsulosin  0.4 mg Oral Daily   Continuous: . 0.9 % NaCl with KCl 20 mEq / L 10 mL/hr at 07/09/13 1607    Assessment/Plan: Principal Problem: 1.  COPD exacerbation- improving.  Will transition to po steroids and continue scheduled nebulizers X 24 hours.  Discontinue O2.    Active Problems: 2. Type 2 diabetes mellitus- continue Levemir, SSI.  3. Permanent atrial fibrillation- continue Pradaxa, Amiodarone, Bisoprolol, Digoxin. 4. Chronic combined systolic and diastolic congestive heart failure- change Lasix back to home dose.  Continue medical therapy.  Check BMET today. 5. Thrombocytopenia- check CBC today. 6. Disposition- will plan for discharge to independent living at [facility] tomorrow if stable on oral treatments.     LOS: 3 days   [name],[name] 07/12/2013, 7:37 AM

## 2013-07-12 NOTE — Progress Notes (Signed)
[name] is active with [facility]. [facility] Care Management will continue to follow patient post hospital discharge. He will receive post hospital discharge call and will be evaluated for monthly home visits. Will make inpatient RNCM that [facility] Care Management following. If patient should require home health at discharge, [facility] Care Management will not interfere or replace. Will continue to follow. Darin Noble, MSN- Ed, RN,BSN- [facility] [phone]

## 2013-07-13 LAB — BASIC METABOLIC PANEL
BUN: 27 mg/dL — ABNORMAL HIGH (ref 6–23)
CO2: 30 mEq/L (ref 19–32)
Calcium: 8.3 mg/dL — ABNORMAL LOW (ref 8.4–10.5)
Chloride: 99 mEq/L (ref 96–112)
Creatinine, Ser: 0.84 mg/dL (ref 0.50–1.35)
GFR calc Af Amer: >90 mL/min (ref >90)
Glucose, Bld: 174 mg/dL — ABNORMAL HIGH (ref 70–99)
Sodium: 135 mEq/L (ref 135–145)

## 2013-07-13 LAB — CBC
HCT: 38.8 % — ABNORMAL LOW (ref 39.0–52.0)
MCH: 29.2 pg (ref 26.0–34.0)
MCHC: 32 g/dL (ref 30.0–36.0)
MCV: 91.5 fL (ref 78.0–100.0)
RDW: 14.7 % (ref 11.5–15.5)
WBC: 8.8 10*3/uL (ref 4.0–10.5)

## 2013-07-13 MED ORDER — PREDNISONE 10 MG PO TABS: 10.0000 mg | ORAL_TABLET | Freq: Every day | ORAL | Status: DC

## 2013-07-13 MED ORDER — IPRATROPIUM-ALBUTEROL 0.5-2.5 (3) MG/3ML IN SOLN: 3.0000 mL | Freq: Four times a day (QID) | RESPIRATORY_TRACT | Status: DC

## 2013-07-13 NOTE — Discharge Summary (Signed)
DISCHARGE SUMMARY  [name]  MR#: [id number]  DOB:11/22/1927  Date of Admission: 07/09/2013 Date of Discharge: 07/13/2013  Attending Physician:[name],[name]  Patient's [name],Darin [name], MD  Consults:  None  Discharge Diagnoses: Principal Problem:   COPD exacerbation Active Problems:   Acute-on-chronic respiratory failure   Type 2 diabetes mellitus   Permanent atrial fibrillation   Chronic anticoagulation   Chronic combined systolic and diastolic congestive heart failure   Thrombocytopenia, mild  Past Medical History  Diagnosis Date  . Pneumonia 12/06/10    healthcare-associated/ left lower lobe  . COPD (chronic obstructive pulmonary disease)     severe stage IV  . Atrial fibrillation     on Coumadin with St. Jude single-chamber pacemaker  . Diabetes mellitus     type 2; s/p Prednisone therapy for pneumonia 12/2010  . Coronary artery disease     s/p anterior STEMI 09/2009; cath. revealed mid LAD 40%, distal LAD 100%- PTCA, prox. RCA 40%, mid. RCA 100% with good collateral filling of PDA; EF 25%; NSTEMI 07/2011 - PCI/DES 100% LCX  . ST elevation (STEMI) myocardial infarction 01//11/12    anterior  . CHF (congestive heart failure)     EF 25% on 09/26/10; EF 50-55% and grade 1 diastolic dysfunction on ECHO 08/2010   . Hypertension   . Hyperlipidemia   . AAA (abdominal aortic aneurysm)     3 cm infrarenal abdominal aortic aneurysm per aorta ultrasound 03/2010  . Osteoarthritis     s/p bilat hip arthroplasty  . Angina   . Ischemic cardiomyopathy     EF 35% LHC 11/12  . GERD (gastroesophageal reflux disease)   . Hypercholesteremia   . PVD (peripheral vascular disease)   . Anxiety    Past Surgical History  Procedure Laterality Date  . Hip arthroplasty      s/p right 09/27/10 and s/p left 09/24/10  . Back surgery    . Knee surgery    . Pacemaker insertion  12/2010    St. Jude single-chamber   . Cardiac catheterization  09/2009, 06/2007    PTCA to distal  LAD    Discharge Medications:   Medication List    STOP taking these medications       amoxicillin-clavulanate 875-125 MG per tablet  Commonly known as:  AUGMENTIN      TAKE these medications       amiodarone 200 MG tablet  Commonly known as:  PACERONE  Take 1 tablet (200 mg total) by mouth daily.     beclomethasone 80 MCG/ACT inhaler  Commonly known as:  QVAR  Inhale 1 puff into the lungs 2 (two) times daily.     bisoprolol 10 MG tablet  Commonly known as:  ZEBETA  Take 1 tablet (10 mg total) by mouth daily.     cholecalciferol 1000 UNITS tablet  Commonly known as:  VITAMIN D  Take 2,000 Units by mouth daily.     dabigatran 150 MG Caps capsule  Commonly known as:  PRADAXA  Take 150 mg by mouth every 12 (twelve) hours.     digoxin 0.125 MG tablet  Commonly known as:  LANOXIN  Take 0.125 mg by mouth daily.     finasteride 5 MG tablet  Commonly known as:  PROSCAR  Take 5 mg by mouth daily.     furosemide 20 MG tablet  Commonly known as:  LASIX  Take 20 mg by mouth daily. TAKE one daily     guaiFENesin 600 MG 12 hr  tablet  Commonly known as:  MUCINEX  Take 1,200 mg by mouth 2 (two) times daily.     ipratropium-albuterol 0.5-2.5 (3) MG/3ML Soln  Commonly known as:  DUONEB  Take 3 mLs by nebulization every 6 (six) hours.     isosorbide mononitrate 60 MG 24 hr tablet  Commonly known as:  IMDUR  Take 60 mg by mouth daily.     losartan 25 MG tablet  Commonly known as:  COZAAR  Take 1 tablet (25 mg total) by mouth daily.     nitroGLYCERIN 0.4 MG SL tablet  Commonly known as:  NITROSTAT  Place 0.4 mg under the tongue every 5 (five) minutes as needed. For chest pain     predniSONE 10 MG tablet  Commonly known as:  DELTASONE  Take 1 tablet (10 mg total) by mouth daily. Take 4 pills daily for 2 days, then 2 pills daily for 2 days, then 1 pill daily for 2 days, then 1/2 pill daily thereafter (or resume 5mg  pills)     rosuvastatin 20 MG tablet  Commonly known  as:  CRESTOR  Take 20 mg by mouth daily.     sennosides-docusate sodium 8.6-50 MG tablet  Commonly known as:  SENOKOT-S  Take 2 tablets by mouth at bedtime.     sitaGLIPtin 25 MG tablet  Commonly known as:  JANUVIA  Take 25 mg by mouth daily.     tamsulosin 0.4 MG Caps capsule  Commonly known as:  FLOMAX  Take 0.4 mg by mouth daily.        Hospital Procedures: Dg Chest 2 View  07/09/2013   CLINICAL DATA:  shortness of breath  EXAM: CHEST  2 VIEW  COMPARISON:  September 27, 2012  FINDINGS: There is underlying emphysema. There is scarring in the right base. The interstitium is mildly prominent overall but stable. There is no airspace consolidation.  There is moderate cardiomegaly. The pulmonary vascularity is felt to show evidence of a degree of volume overload given underlying emphysema. There is atherosclerotic change in the aorta. No adenopathy. Pacemaker lead is attached to the right atrium.  IMPRESSION: Suspect a degree of chronic congestive heart failure superimposed on emphysema. No consolidation. No new opacity compared to prior study.   Electronically Signed   By: Bretta Bang M.D.   On: 07/09/2013 08:43    History of Present Illness: The patient is an 79 year old man with severe COPD who was doing well until exposed to a heavy perfume scent yesterday afternoon. He developed progressively worsening DOE overnight that was present at rest this morning leading to an ER evaluation. He had not had significant chest pain, palpitations, productive cough, fever, or chills. He did have some orthopnea. Workup in the ER was significant for hypoxia with minimal activity, a clear chest X-ray, and labs with an elevated pro-BNP level. He was started on frequent nebs and IV solumedrol several hours ago and already feels much better and is able to easily converse with nasal cannula oxygen in place. He has requested a DNR order.   Hospital Course: Mr. [name] was admitted to a telemetry bed.  He was treated for COPD exacerbation with IV Solu-Medrol and scheduled albuterol/Atrovent nebulizers. With this treatment, his shortness of breath and wheezing improved significantly. He was transitioned to oral steroids and weaned off of oxygen.  At this point, the patient has significantly improved with minimal wheezing. He is ambulating without significant dyspnea above his baseline. He is stable for discharge home  with continued scheduled nebulizers and prednisone taper.  The patient did have some steroid-induced hyperglycemia. He was placed on Levemir and sliding scale insulin with reasonable control. At home, he takes [place] which has become cost prohibitive. He'll be seen back in the office in 2 days and transitioned to an alternative agent at that time with options to include glipizide or insulin therapy pending review of his sugars.  From a cardiac standpoint he remained stable throughout this hospitalization with no evidence of acute heart failure or issues related to his atrial fibrillation. He was continued on his amiodarone and Bisoprolol as well as anticoagulation with Pradaxa.  Day of Discharge Exam BP 127/79  Pulse 60  Temp(Src) 97.4 F (36.3 C) (Oral)  Resp 18  Ht 5\' 9"  (1.753 m)  Wt 80.3 kg (177 lb 0.5 oz)  BMI 26.13 kg/m2  SpO2 97%  Physical Exam: General appearance: alert and no distress Eyes: no scleral icterus Throat: oropharynx moist without erythema Resp: minimal bilateral expiratory wheezing Cardio: regular rate and rhythm GI: soft, non-tender; bowel sounds normal; no masses,  no organomegaly Extremities: no clubbing, cyanosis or edema  Discharge Labs:  Recent Labs  07/12/13 0817 07/13/13 0530  NA 135 135  K 3.9 3.8  CL 98 99  CO2 31 30  GLUCOSE 163* 174*  BUN 25* 27*  CREATININE 0.76 0.84  CALCIUM 8.5 8.3*   No results found for this basename: AST, ALT, ALKPHOS, BILITOT, PROT, ALBUMIN,  in the last 72 hours  Recent Labs  07/12/13 0817  07/13/13 0530  WBC 11.1* 8.8  HGB 13.5 12.4*  HCT 43.0 38.8*  MCV 92.9 91.5  PLT 147* 129*   Lab Results  Component Value Date   INR 2.03* 09/28/2012   INR 2.43* 09/27/2012   INR 2.39* 04/14/2012   BNP    Component Value Date/Time   PROBNP 4359.0* 07/10/2013 0321   Discharge instructions:     Discharge Orders   Future Appointments Provider Department Dept Phone   07/28/2013 11:45 AM Cassell Clement, MD [facility] [place] Office [phone]   Future Orders Complete By Expires   Diet - low sodium heart healthy  As directed    Discharge instructions  As directed    Comments:     Call if you have increased shortness of breath, increased wheezing, increased sputum production or fever above 101.5.  Taper prednisone dose as recommended.  Make sure your nebulizers have Albuterol and Atrovent (duoneb) and take every 6 hours.   Increase activity slowly  As directed       Disposition: to [facility] at [facility].  Follow-up Appts: Follow-up with Dr. [name] at [facility] on 10/30 as scheduled previously.  Condition on Discharge: Improved  Tests Needing Follow-up: None  Code Status: DNR  Time with discharge activities: 40 minutes  Signed: Laiah Pouncey,[name] 07/13/2013, 7:40 AM

## 2013-07-13 NOTE — Progress Notes (Signed)
Met with Mr Hagin prior to discharge. Made him aware that [facility] Care Management will continue to follow up with him post hospital discharge. He states he has been managing fine until the strong perfume scents at his his facility. Reports he weighs himself every morning except he does not write it down. Encouraged him to write his weights down as well. He reports his PCP appointment is 07/15/13, Thursday. No further needs assessed and will relay conversation details with his [facility] with [facility].  [name], MSN-Ed, [facility] Liaison-[phone]

## 2013-07-13 NOTE — Progress Notes (Signed)
Notified central telemetry that patient's telemetry discontinued and patient being discharged today

## 2013-07-28 ENCOUNTER — Encounter: Payer: Self-pay | Admitting: Cardiology

## 2013-07-28 ENCOUNTER — Ambulatory Visit (INDEPENDENT_AMBULATORY_CARE_PROVIDER_SITE_OTHER): Payer: Medicare Other | Admitting: Cardiology

## 2013-07-28 VITALS — BP 124/72 | HR 60 | Ht 69.0 in | Wt 170.0 lb

## 2013-07-28 DIAGNOSIS — I4891 Unspecified atrial fibrillation: Secondary | ICD-10-CM

## 2013-07-28 DIAGNOSIS — I5042 Chronic combined systolic (congestive) and diastolic (congestive) heart failure: Secondary | ICD-10-CM

## 2013-07-28 DIAGNOSIS — I214 Non-ST elevation (NSTEMI) myocardial infarction: Secondary | ICD-10-CM

## 2013-07-28 DIAGNOSIS — I472 Ventricular tachycardia, unspecified: Secondary | ICD-10-CM

## 2013-07-28 DIAGNOSIS — I4821 Permanent atrial fibrillation: Secondary | ICD-10-CM

## 2013-07-28 DIAGNOSIS — I509 Heart failure, unspecified: Secondary | ICD-10-CM

## 2013-07-28 DIAGNOSIS — I2510 Atherosclerotic heart disease of native coronary artery without angina pectoris: Secondary | ICD-10-CM

## 2013-07-28 NOTE — Assessment & Plan Note (Signed)
The patient has not had been having any recurrent exertional chest discomfort.  He took one sublingual nitroglycerin several evenings ago for what in retrospect he thought was probably indigestion.

## 2013-07-28 NOTE — Assessment & Plan Note (Signed)
Patient has a past history of ventricular tachycardia.  He is on low-dose amiodarone for ventricular tachycardia.

## 2013-07-28 NOTE — Patient Instructions (Signed)
Your physician recommends that you continue on your current medications as directed. Please refer to the Current Medication list given to you today.  Your physician wants you to follow-up in: 6 months with Dr. Patty Sermons.  You will receive a reminder letter in the mail two months in advance. If you don't receive a letter, please call our office to schedule the follow-up appointment. (you will have an EKG at this office visit)

## 2013-07-28 NOTE — Assessment & Plan Note (Signed)
The patient is in permanent atrial fibrillation.  He is on Pradaxa.  He has not had any TIA symptoms.  He denies any problem with bruising or bleeding.

## 2013-07-28 NOTE — Progress Notes (Signed)
[name] Date of Birth:  10/19/1927 [address] [place], [place]  [address] [phone]         Fax   [phone]  History of Present Illness: This pleasant 79 year old gentleman is seen for a scheduled 4 month followup office visit. He has a past history of severe COPD and has had multiple hospitalizations in the past for exacerbation of COPD and exacerbation of diastolic congestive heart failure.he was recently hospitalized in October 2014 for exacerbation of COPD.  He has a past history of acute on chronic combined systolic and diastolic heart failure and a past history of atrial fibrillation with rapid ventricular response.  He has a functioning ventricular pacemaker and is followed by Dr. Since last visit he has been doing well. His breathing is much improved and he is not on home oxygen.  He walks from his room to the dining hall with a walker.   Current Outpatient Prescriptions  Medication Sig Dispense Refill  . amiodarone (PACERONE) 200 MG tablet Take 1 tablet (200 mg total) by mouth daily.  30 tablet  1  . beclomethasone (QVAR) 80 MCG/ACT inhaler Inhale 1 puff into the lungs 2 (two) times daily.      . bisoprolol (ZEBETA) 10 MG tablet Take 1 tablet (10 mg total) by mouth daily.  30 tablet  3  . cholecalciferol (VITAMIN D) 1000 UNITS tablet Take 2,000 Units by mouth daily.      . dabigatran (PRADAXA) 150 MG CAPS Take 150 mg by mouth every 12 (twelve) hours.      . digoxin (LANOXIN) 0.125 MG tablet Take 0.125 mg by mouth daily.      . finasteride (PROSCAR) 5 MG tablet Take 5 mg by mouth daily.      . furosemide (LASIX) 20 MG tablet Take 20 mg by mouth daily. TAKE one daily      . guaiFENesin (MUCINEX) 600 MG 12 hr tablet Take 1,200 mg by mouth 2 (two) times daily.      Marland Kitchen ipratropium-albuterol (DUONEB) 0.5-2.5 (3) MG/3ML SOLN Take 3 mLs by nebulization every 6 (six) hours.  360 mL  11  . isosorbide mononitrate (IMDUR) 60 MG 24 hr tablet Take 60 mg by mouth  daily.        Marland Kitchen losartan (COZAAR) 25 MG tablet Take 1 tablet (25 mg total) by mouth daily.  30 tablet  6  . nitroGLYCERIN (NITROSTAT) 0.4 MG SL tablet Place 0.4 mg under the tongue every 5 (five) minutes as needed. For chest pain      . predniSONE (DELTASONE) 10 MG tablet Take 1 tablet (10 mg total) by mouth daily. Take 4 pills daily for 2 days, then 2 pills daily for 2 days, then 1 pill daily for 2 days, then 1/2 pill daily thereafter (or resume 5mg  pills)  30 tablet  0  . rosuvastatin (CRESTOR) 20 MG tablet Take 20 mg by mouth daily.      . sennosides-docusate sodium (SENOKOT-S) 8.6-50 MG tablet Take 2 tablets by mouth at bedtime.       . Tamsulosin HCl (FLOMAX) 0.4 MG CAPS Take 0.4 mg by mouth daily.       No current facility-administered medications for this visit.    No Known Allergies  Patient Active Problem List   Diagnosis Date Noted  . Ventricular tachycardia 06/15/2013  . Cancer screening, for lung 12/12/2012  . Chronic combined systolic and diastolic congestive heart failure 09/28/2012  . Leg weakness, bilateral 04/13/2012  .  E-coli UTI 04/09/2012  . Hyponatremia 04/09/2012  . Chronic anticoagulation 04/04/2012    Class: Chronic  . Depression 03/16/2012    Class: Chronic  . Type 2 diabetes mellitus 03/11/2012  . Chest pain 03/11/2012  . COPD (chronic obstructive pulmonary disease) 03/11/2012  . Permanent atrial fibrillation 03/11/2012  . Hypoxemia 03/10/2012  . Bronchitis 03/10/2012  . Pulmonary edema 03/10/2012  . Acute-on-chronic respiratory failure 11/15/2011  . Acute on chronic combined systolic and diastolic heart failure 11/11/2011  . Spiritual Distress 11/08/2011  . COPD exacerbation 11/07/2011  . Dyspnea 08/27/2011  . HLD (hyperlipidemia) 08/27/2011  . NSTEMI (non-ST elevated myocardial infarction) 08/15/2011  . CAD (coronary artery disease) 08/15/2011  . Unstable angina 08/13/2011  . Post-nasal drip 04/26/2011  . Pacemaker-St.Jude 04/02/2011  . CHF  (congestive heart failure) 12/26/2010  . Abdominal aortic aneurysm 12/26/2010  . Emphysema/COPD 12/26/2010    History  Smoking status  . Former Smoker -- 2.00 packs/day for 50 years  . Types: Cigarettes  . Quit date: 09/02/2009  Smokeless tobacco  . Never Used    Comment: smoked for 11yrs quit for 30yrs started back & smoked for 10ys    History  Alcohol Use No    Comment: occ - alcohol    Family History  Problem Relation Age of Onset  . Heart attack Mother 8  . Heart attack Father 53  . Cancer Other     siblings  . Heart attack Other     Review of Systems: Constitutional: no fever chills diaphoresis or fatigue or change in weight.  Head and neck: no hearing loss, no epistaxis, no photophobia or visual disturbance. Respiratory: No cough, shortness of breath or wheezing. Cardiovascular: No chest pain peripheral edema, palpitations. Gastrointestinal: No abdominal distention, no abdominal pain, no change in bowel habits hematochezia or melena. Genitourinary: No dysuria, no frequency, no urgency, no nocturia. Musculoskeletal:No arthralgias, no back pain, no gait disturbance or myalgias. Neurological: No dizziness, no headaches, no numbness, no seizures, no syncope, no weakness, no tremors. Hematologic: No lymphadenopathy, no easy bruising. Psychiatric: No confusion, no hallucinations, no sleep disturbance.    Physical Exam: Filed Vitals:   07/28/13 1209  BP: 124/72  Pulse: 60   the general appearance reveals an alert pleasant elderly gentleman in no distress.  He is not dyspneic with ordinary activity.The head and neck exam reveals pupils equal and reactive.  Extraocular movements are full.  There is no scleral icterus.  The mouth and pharynx are normal.  The neck is supple.  The carotids reveal no bruits.  The jugular venous pressure is normal.  The  thyroid is not enlarged.  There is no lymphadenopathy.  The chest is clear to percussion and auscultation.  There are no  rales or rhonchi.  Expansion of the chest is symmetrical.  The precordium is quiet.  The first heart sound is normal.  The second heart sound is physiologically split.  There is no murmur gallop rub or click.  There is no abnormal lift or heave.  The abdomen is soft and nontender.  The bowel sounds are normal.  The liver and spleen are not enlarged.  There are no abdominal masses.  There are no abdominal bruits.  Extremities reveal good pedal pulses.  There is no phlebitis or edema.  There is no cyanosis or clubbing.  Strength is normal and symmetrical in all extremities.  There is no lateralizing weakness.  There are no sensory deficits.  The skin is warm and dry.  There  is no rash.  EKG from recent hospitalization was reviewed and shows fine atrial fibrillation with paced ventricular rhythm.   Assessment / Plan: The patient continues to do very well.  Continue same medication.  Recheck in 6 months for office visit and EKG

## 2013-09-09 ENCOUNTER — Emergency Department (HOSPITAL_COMMUNITY): Payer: Medicare Other

## 2013-09-09 ENCOUNTER — Encounter (HOSPITAL_COMMUNITY): Payer: Self-pay | Admitting: Emergency Medicine

## 2013-09-09 ENCOUNTER — Inpatient Hospital Stay (HOSPITAL_COMMUNITY)
Admission: EM | Admit: 2013-09-09 | Discharge: 2013-09-17 | DRG: 190 | Disposition: A | Discharge status: Skilled Nursing Facility | Payer: Medicare Other | Attending: Internal Medicine | Admitting: Internal Medicine

## 2013-09-09 DIAGNOSIS — Z9861 Coronary angioplasty status: Secondary | ICD-10-CM

## 2013-09-09 DIAGNOSIS — Z96649 Presence of unspecified artificial hip joint: Secondary | ICD-10-CM

## 2013-09-09 DIAGNOSIS — IMO0002 Reserved for concepts with insufficient information to code with codable children: Secondary | ICD-10-CM

## 2013-09-09 DIAGNOSIS — E785 Hyperlipidemia, unspecified: Secondary | ICD-10-CM | POA: Diagnosis present

## 2013-09-09 DIAGNOSIS — Z79899 Other long term (current) drug therapy: Secondary | ICD-10-CM

## 2013-09-09 DIAGNOSIS — I252 Old myocardial infarction: Secondary | ICD-10-CM

## 2013-09-09 DIAGNOSIS — F411 Generalized anxiety disorder: Secondary | ICD-10-CM | POA: Diagnosis present

## 2013-09-09 DIAGNOSIS — K219 Gastro-esophageal reflux disease without esophagitis: Secondary | ICD-10-CM | POA: Diagnosis present

## 2013-09-09 DIAGNOSIS — I714 Abdominal aortic aneurysm, without rupture, unspecified: Secondary | ICD-10-CM | POA: Diagnosis present

## 2013-09-09 DIAGNOSIS — E119 Type 2 diabetes mellitus without complications: Secondary | ICD-10-CM | POA: Diagnosis present

## 2013-09-09 DIAGNOSIS — Z66 Do not resuscitate: Secondary | ICD-10-CM | POA: Diagnosis not present

## 2013-09-09 DIAGNOSIS — Z8249 Family history of ischemic heart disease and other diseases of the circulatory system: Secondary | ICD-10-CM

## 2013-09-09 DIAGNOSIS — E7800 Pure hypercholesterolemia, unspecified: Secondary | ICD-10-CM | POA: Diagnosis present

## 2013-09-09 DIAGNOSIS — I739 Peripheral vascular disease, unspecified: Secondary | ICD-10-CM | POA: Diagnosis present

## 2013-09-09 DIAGNOSIS — M1990 Unspecified osteoarthritis, unspecified site: Secondary | ICD-10-CM | POA: Diagnosis present

## 2013-09-09 DIAGNOSIS — I4821 Permanent atrial fibrillation: Secondary | ICD-10-CM | POA: Diagnosis present

## 2013-09-09 DIAGNOSIS — I2589 Other forms of chronic ischemic heart disease: Secondary | ICD-10-CM | POA: Diagnosis present

## 2013-09-09 DIAGNOSIS — Z87891 Personal history of nicotine dependence: Secondary | ICD-10-CM

## 2013-09-09 DIAGNOSIS — I2510 Atherosclerotic heart disease of native coronary artery without angina pectoris: Secondary | ICD-10-CM | POA: Diagnosis present

## 2013-09-09 DIAGNOSIS — I509 Heart failure, unspecified: Secondary | ICD-10-CM | POA: Diagnosis present

## 2013-09-09 DIAGNOSIS — I5042 Chronic combined systolic (congestive) and diastolic (congestive) heart failure: Secondary | ICD-10-CM | POA: Diagnosis present

## 2013-09-09 DIAGNOSIS — T380X5A Adverse effect of glucocorticoids and synthetic analogues, initial encounter: Secondary | ICD-10-CM | POA: Diagnosis present

## 2013-09-09 DIAGNOSIS — I10 Essential (primary) hypertension: Secondary | ICD-10-CM | POA: Diagnosis present

## 2013-09-09 DIAGNOSIS — R0603 Acute respiratory distress: Secondary | ICD-10-CM

## 2013-09-09 DIAGNOSIS — I4891 Unspecified atrial fibrillation: Secondary | ICD-10-CM | POA: Diagnosis present

## 2013-09-09 DIAGNOSIS — Z7901 Long term (current) use of anticoagulants: Secondary | ICD-10-CM

## 2013-09-09 DIAGNOSIS — J441 Chronic obstructive pulmonary disease with (acute) exacerbation: Principal | ICD-10-CM | POA: Diagnosis present

## 2013-09-09 DIAGNOSIS — J189 Pneumonia, unspecified organism: Secondary | ICD-10-CM | POA: Diagnosis present

## 2013-09-09 DIAGNOSIS — Z950 Presence of cardiac pacemaker: Secondary | ICD-10-CM

## 2013-09-09 LAB — CBC
HCT: 43.7 % (ref 39.0–52.0)
Hemoglobin: 13.7 g/dL (ref 13.0–17.0)
MCH: 29.1 pg (ref 26.0–34.0)
MCV: 92.8 fL (ref 78.0–100.0)
Platelets: 173 10*3/uL (ref 150–400)
RDW: 15.5 % (ref 11.5–15.5)
WBC: 16 10*3/uL — ABNORMAL HIGH (ref 4.0–10.5)

## 2013-09-09 LAB — BASIC METABOLIC PANEL
CO2: 22 mEq/L (ref 19–32)
Calcium: 8.9 mg/dL (ref 8.4–10.5)
Chloride: 99 mEq/L (ref 96–112)
Creatinine, Ser: 0.94 mg/dL (ref 0.50–1.35)
GFR calc Af Amer: 87 mL/min — ABNORMAL LOW (ref >90)
Glucose, Bld: 131 mg/dL — ABNORMAL HIGH (ref 70–99)
Potassium: 5 mEq/L (ref 3.5–5.1)
Sodium: 135 mEq/L (ref 135–145)

## 2013-09-09 LAB — POCT I-STAT TROPONIN I: Troponin i, poc: 0.03 ng/mL (ref 0.00–0.08)

## 2013-09-09 LAB — GLUCOSE, CAPILLARY
Glucose-Capillary: 266 mg/dL — ABNORMAL HIGH (ref 70–99)
Glucose-Capillary: 180 mg/dL — ABNORMAL HIGH (ref 70–99)

## 2013-09-09 LAB — PRO B NATRIURETIC PEPTIDE: Pro B Natriuretic peptide (BNP): 2732 pg/mL — ABNORMAL HIGH (ref 0–450)

## 2013-09-09 MED ORDER — IPRATROPIUM BROMIDE 0.02 % IN SOLN
1.0000 mg | Freq: Once | RESPIRATORY_TRACT | Status: AC
  Filled 2013-09-09: qty 5

## 2013-09-09 MED ORDER — FINASTERIDE 5 MG PO TABS
5.0000 mg | ORAL_TABLET | Freq: Every day | ORAL | Status: DC
  Administered 2013-09-09 – 2013-09-17 (×9): 5 mg via ORAL
  Filled 2013-09-09 (×9): qty 1

## 2013-09-09 MED ORDER — METHYLPREDNISOLONE SODIUM SUCC 125 MG IJ SOLR
125.0000 mg | Freq: Once | INTRAMUSCULAR | Status: AC
  Administered 2013-09-09: 125 mg via INTRAVENOUS
  Filled 2013-09-09: qty 2

## 2013-09-09 MED ORDER — DIGOXIN 125 MCG PO TABS
0.1250 mg | ORAL_TABLET | Freq: Every day | ORAL | Status: DC
  Administered 2013-09-09 – 2013-09-17 (×9): 0.125 mg via ORAL
  Filled 2013-09-09 (×9): qty 1

## 2013-09-09 MED ORDER — TAMSULOSIN HCL 0.4 MG PO CAPS
0.4000 mg | ORAL_CAPSULE | Freq: Every day | ORAL | Status: DC
  Administered 2013-09-09 – 2013-09-17 (×9): 0.4 mg via ORAL
  Filled 2013-09-09 (×9): qty 1

## 2013-09-09 MED ORDER — METHYLPREDNISOLONE SODIUM SUCC 125 MG IJ SOLR: 125.0000 mg | Freq: Four times a day (QID) | INTRAMUSCULAR | Status: DC

## 2013-09-09 MED ORDER — SODIUM CHLORIDE 0.9 % IJ SOLN
3.0000 mL | Freq: Two times a day (BID) | INTRAMUSCULAR | Status: DC
  Administered 2013-09-10 – 2013-09-17 (×16): 3 mL via INTRAVENOUS

## 2013-09-09 MED ORDER — BISOPROLOL FUMARATE 10 MG PO TABS
10.0000 mg | ORAL_TABLET | Freq: Every day | ORAL | Status: DC
  Administered 2013-09-09 – 2013-09-17 (×9): 10 mg via ORAL
  Filled 2013-09-09 (×9): qty 1

## 2013-09-09 MED ORDER — ACETAMINOPHEN 650 MG RE SUPP: 650.0000 mg | Freq: Four times a day (QID) | RECTAL | Status: DC | PRN

## 2013-09-09 MED ORDER — ALBUTEROL SULFATE (5 MG/ML) 0.5% IN NEBU: 2.5000 mg | INHALATION_SOLUTION | RESPIRATORY_TRACT | Status: DC | PRN

## 2013-09-09 MED ORDER — AMIODARONE HCL 200 MG PO TABS
200.0000 mg | ORAL_TABLET | Freq: Every day | ORAL | Status: DC
  Administered 2013-09-09 – 2013-09-17 (×9): 200 mg via ORAL
  Filled 2013-09-09 (×9): qty 1

## 2013-09-09 MED ORDER — DABIGATRAN ETEXILATE MESYLATE 150 MG PO CAPS
150.0000 mg | ORAL_CAPSULE | Freq: Two times a day (BID) | ORAL | Status: DC
  Administered 2013-09-09 – 2013-09-17 (×16): 150 mg via ORAL
  Filled 2013-09-09 (×17): qty 1

## 2013-09-09 MED ORDER — INSULIN ASPART 100 UNIT/ML ~~LOC~~ SOLN
0.0000 [IU] | Freq: Every day | SUBCUTANEOUS | Status: DC
  Administered 2013-09-11: 23:00:00 5 [IU] via SUBCUTANEOUS
  Administered 2013-09-12 – 2013-09-16 (×4): 2 [IU] via SUBCUTANEOUS

## 2013-09-09 MED ORDER — FUROSEMIDE 20 MG PO TABS
20.0000 mg | ORAL_TABLET | Freq: Every day | ORAL | Status: DC
  Administered 2013-09-09 – 2013-09-17 (×9): 20 mg via ORAL
  Filled 2013-09-09 (×9): qty 1

## 2013-09-09 MED ORDER — ISOSORBIDE MONONITRATE ER 60 MG PO TB24
60.0000 mg | ORAL_TABLET | Freq: Every day | ORAL | Status: DC
  Administered 2013-09-09 – 2013-09-17 (×9): 60 mg via ORAL
  Filled 2013-09-09 (×9): qty 1

## 2013-09-09 MED ORDER — ACETAMINOPHEN 325 MG PO TABS
650.0000 mg | ORAL_TABLET | Freq: Four times a day (QID) | ORAL | Status: DC | PRN
  Administered 2013-09-12 – 2013-09-16 (×4): 650 mg via ORAL
  Filled 2013-09-09 (×4): qty 2

## 2013-09-09 MED ORDER — ATORVASTATIN CALCIUM 40 MG PO TABS
40.0000 mg | ORAL_TABLET | Freq: Every day | ORAL | Status: DC
  Administered 2013-09-09 – 2013-09-16 (×8): 40 mg via ORAL
  Filled 2013-09-09 (×9): qty 1

## 2013-09-09 MED ORDER — SODIUM CHLORIDE 0.9 % IR SOLN: 3000.0000 mL | Status: DC

## 2013-09-09 MED ORDER — SENNOSIDES-DOCUSATE SODIUM 8.6-50 MG PO TABS
2.0000 | ORAL_TABLET | Freq: Every day | ORAL | Status: DC
  Administered 2013-09-09 – 2013-09-13 (×5): 2 via ORAL
  Filled 2013-09-09 (×9): qty 2

## 2013-09-09 MED ORDER — INSULIN ASPART 100 UNIT/ML ~~LOC~~ SOLN
0.0000 [IU] | Freq: Three times a day (TID) | SUBCUTANEOUS | Status: DC
  Administered 2013-09-09: 17:00:00 8 [IU] via SUBCUTANEOUS
  Administered 2013-09-10 (×2): 3 [IU] via SUBCUTANEOUS
  Administered 2013-09-10: 18:00:00 2 [IU] via SUBCUTANEOUS
  Administered 2013-09-11 (×3): 3 [IU] via SUBCUTANEOUS
  Administered 2013-09-12 (×2): 5 [IU] via SUBCUTANEOUS
  Administered 2013-09-12 – 2013-09-13 (×2): 3 [IU] via SUBCUTANEOUS
  Administered 2013-09-13 – 2013-09-14 (×4): 5 [IU] via SUBCUTANEOUS
  Administered 2013-09-14: 3 [IU] via SUBCUTANEOUS
  Administered 2013-09-15 (×2): 2 [IU] via SUBCUTANEOUS
  Administered 2013-09-15: 17:00:00 3 [IU] via SUBCUTANEOUS
  Administered 2013-09-16: 2 [IU] via SUBCUTANEOUS
  Administered 2013-09-16: 18:00:00 3 [IU] via SUBCUTANEOUS

## 2013-09-09 MED ORDER — GUAIFENESIN ER 600 MG PO TB12
1200.0000 mg | ORAL_TABLET | Freq: Two times a day (BID) | ORAL | Status: DC
  Administered 2013-09-09 – 2013-09-17 (×16): 1200 mg via ORAL
  Filled 2013-09-09 (×17): qty 2

## 2013-09-09 MED ORDER — IPRATROPIUM BROMIDE 0.02 % IN SOLN
1.0000 mg | Freq: Once | RESPIRATORY_TRACT | Status: AC
  Administered 2013-09-09: 1 mg via RESPIRATORY_TRACT
  Filled 2013-09-09: qty 5

## 2013-09-09 MED ORDER — SODIUM CHLORIDE 0.9 % IV SOLN: INTRAVENOUS | Status: DC

## 2013-09-09 MED ORDER — LEVOFLOXACIN IN D5W 750 MG/150ML IV SOLN
750.0000 mg | INTRAVENOUS | Status: DC
  Administered 2013-09-09 – 2013-09-10 (×2): 750 mg via INTRAVENOUS
  Filled 2013-09-09 (×2): qty 150

## 2013-09-09 MED ORDER — ALBUTEROL SULFATE (2.5 MG/3ML) 0.083% IN NEBU
2.5000 mg | INHALATION_SOLUTION | RESPIRATORY_TRACT | Status: DC | PRN
  Administered 2013-09-09 – 2013-09-12 (×7): 2.5 mg via RESPIRATORY_TRACT
  Filled 2013-09-09 (×5): qty 0.5
  Filled 2013-09-09: qty 3
  Filled 2013-09-09 (×2): qty 0.5

## 2013-09-09 MED ORDER — LOSARTAN POTASSIUM 25 MG PO TABS
25.0000 mg | ORAL_TABLET | Freq: Every day | ORAL | Status: DC
  Administered 2013-09-09 – 2013-09-17 (×9): 25 mg via ORAL
  Filled 2013-09-09 (×9): qty 1

## 2013-09-09 MED ORDER — ALBUTEROL (5 MG/ML) CONTINUOUS INHALATION SOLN
15.0000 mg/h | INHALATION_SOLUTION | Freq: Once | RESPIRATORY_TRACT | Status: AC
  Administered 2013-09-09: 15 mg/h via RESPIRATORY_TRACT
  Filled 2013-09-09: qty 20

## 2013-09-09 MED ORDER — ALBUTEROL (5 MG/ML) CONTINUOUS INHALATION SOLN
15.0000 mg/h | INHALATION_SOLUTION | Freq: Once | RESPIRATORY_TRACT | Status: AC
  Filled 2013-09-09: qty 60

## 2013-09-09 MED ORDER — IPRATROPIUM BROMIDE 0.02 % IN SOLN
0.5000 mg | Freq: Four times a day (QID) | RESPIRATORY_TRACT | Status: DC
  Administered 2013-09-09 – 2013-09-10 (×2): 0.5 mg via RESPIRATORY_TRACT
  Filled 2013-09-09 (×2): qty 2.5

## 2013-09-09 MED ORDER — VITAMIN D3 25 MCG (1000 UNIT) PO TABS
2000.0000 [IU] | ORAL_TABLET | Freq: Every day | ORAL | Status: DC
  Administered 2013-09-09 – 2013-09-17 (×9): 2000 [IU] via ORAL
  Filled 2013-09-09 (×9): qty 2

## 2013-09-09 MED ORDER — NITROGLYCERIN 0.4 MG SL SUBL: 0.4000 mg | SUBLINGUAL_TABLET | SUBLINGUAL | Status: DC | PRN

## 2013-09-09 MED ORDER — METHYLPREDNISOLONE SODIUM SUCC 125 MG IJ SOLR
125.0000 mg | Freq: Four times a day (QID) | INTRAMUSCULAR | Status: DC
  Administered 2013-09-09 – 2013-09-11 (×9): 125 mg via INTRAVENOUS
  Filled 2013-09-09 (×12): qty 2

## 2013-09-09 NOTE — ED Provider Notes (Signed)
CSN: [id number]     Arrival date & time 09/09/13  0813 History   First MD Initiated Contact with Patient 09/09/13 0825     Chief Complaint  Patient presents with  . Shortness of Breath   (Consider location/radiation/quality/duration/timing/severity/associated sxs/prior Treatment) Patient is a 79 y.o. male presenting with shortness of breath. The history is provided by the patient.  Shortness of Breath Severity:  Moderate Onset quality:  Gradual Duration:  1 day Timing:  Constant Progression:  Worsening Chronicity:  Recurrent Context: URI (nasal congestion, worsening cough)   Relieved by:  Nothing Worsened by:  Nothing tried Associated symptoms: cough and sputum production   Associated symptoms: no sore throat and no vomiting   Cough:    Cough characteristics:  Productive   Sputum characteristics:  Clear   Severity:  Moderate   Onset quality:  Gradual   Timing:  Constant   Progression:  Worsening   Past Medical History  Diagnosis Date  . Pneumonia 12/06/10    healthcare-associated/ left lower lobe  . COPD (chronic obstructive pulmonary disease)     severe stage IV  . Atrial fibrillation     on Coumadin with St. Jude single-chamber pacemaker  . Diabetes mellitus     type 2; s/p Prednisone therapy for pneumonia 12/2010  . Coronary artery disease     s/p anterior STEMI 09/2009; cath. revealed mid LAD 40%, distal LAD 100%- PTCA, prox. RCA 40%, mid. RCA 100% with good collateral filling of PDA; EF 25%; NSTEMI 07/2011 - PCI/DES 100% LCX  . ST elevation (STEMI) myocardial infarction 01//11/12    anterior  . CHF (congestive heart failure)     EF 25% on 09/26/10; EF 50-55% and grade 1 diastolic dysfunction on ECHO 08/2010   . Hypertension   . Hyperlipidemia   . AAA (abdominal aortic aneurysm)     3 cm infrarenal abdominal aortic aneurysm per aorta ultrasound 03/2010  . Osteoarthritis     s/p bilat hip arthroplasty  . Angina   . Ischemic cardiomyopathy     EF 35% LHC 11/12   . GERD (gastroesophageal reflux disease)   . Hypercholesteremia   . PVD (peripheral vascular disease)   . Anxiety    Past Surgical History  Procedure Laterality Date  . Hip arthroplasty      s/p right 09/27/10 and s/p left 09/24/10  . Back surgery    . Knee surgery    . Pacemaker insertion  12/2010    St. Jude single-chamber   . Cardiac catheterization  09/2009, 06/2007    PTCA to distal LAD   Family History  Problem Relation Age of Onset  . Heart attack Mother 22  . Heart attack Father 102  . Cancer Other     siblings  . Heart attack Other    History  Substance Use Topics  . Smoking status: Former Smoker -- 2.00 packs/day for 50 years    Types: Cigarettes    Quit date: 09/02/2009  . Smokeless tobacco: Never Used     Comment: smoked for 66yrs quit for 63yrs started back & smoked for 10ys  . Alcohol Use: No     Comment: occ - alcohol    Review of Systems  HENT: Negative for sore throat.   Respiratory: Positive for cough, sputum production and shortness of breath.   Gastrointestinal: Negative for vomiting.  All other systems reviewed and are negative.    Allergies  Review of patient's allergies indicates no known allergies.  Home Medications  Current Outpatient Rx  Name  Route  Sig  Dispense  Refill  . amiodarone (PACERONE) 200 MG tablet   Oral   Take 1 tablet (200 mg total) by mouth daily.   30 tablet   1   . beclomethasone (QVAR) 80 MCG/ACT inhaler   Inhalation   Inhale 1 puff into the lungs 2 (two) times daily.         . bisoprolol (ZEBETA) 10 MG tablet   Oral   Take 1 tablet (10 mg total) by mouth daily.   30 tablet   3   . cholecalciferol (VITAMIN D) 1000 UNITS tablet   Oral   Take 2,000 Units by mouth daily.         . dabigatran (PRADAXA) 150 MG CAPS   Oral   Take 150 mg by mouth every 12 (twelve) hours.         . digoxin (LANOXIN) 0.125 MG tablet   Oral   Take 0.125 mg by mouth daily.         . finasteride (PROSCAR) 5 MG  tablet   Oral   Take 5 mg by mouth daily.         . furosemide (LASIX) 20 MG tablet   Oral   Take 20 mg by mouth daily. TAKE one daily         . guaiFENesin (MUCINEX) 600 MG 12 hr tablet   Oral   Take 1,200 mg by mouth 2 (two) times daily.         Marland Kitchen ipratropium-albuterol (DUONEB) 0.5-2.5 (3) MG/3ML SOLN   Nebulization   Take 3 mLs by nebulization every 6 (six) hours.   360 mL   11   . isosorbide mononitrate (IMDUR) 60 MG 24 hr tablet   Oral   Take 60 mg by mouth daily.           Marland Kitchen losartan (COZAAR) 25 MG tablet   Oral   Take 1 tablet (25 mg total) by mouth daily.   30 tablet   6   . nitroGLYCERIN (NITROSTAT) 0.4 MG SL tablet   Sublingual   Place 0.4 mg under the tongue every 5 (five) minutes as needed. For chest pain         . predniSONE (DELTASONE) 10 MG tablet   Oral   Take 1 tablet (10 mg total) by mouth daily. Take 4 pills daily for 2 days, then 2 pills daily for 2 days, then 1 pill daily for 2 days, then 1/2 pill daily thereafter (or resume 5mg  pills)   30 tablet   0   . rosuvastatin (CRESTOR) 20 MG tablet   Oral   Take 20 mg by mouth daily.         . sennosides-docusate sodium (SENOKOT-S) 8.6-50 MG tablet   Oral   Take 2 tablets by mouth at bedtime.          . Tamsulosin HCl (FLOMAX) 0.4 MG CAPS   Oral   Take 0.4 mg by mouth daily.          BP 139/76  Pulse 61  Resp 33  SpO2 100% Physical Exam  Nursing note and vitals reviewed. Constitutional: He is oriented to person, place, and time. He appears well-developed and well-nourished. He appears distressed.  HENT:  Head: Normocephalic and atraumatic.  Mouth/Throat: No oropharyngeal exudate.  Eyes: EOM are normal. Pupils are equal, round, and reactive to light.  Neck: Normal range of motion. Neck supple.  Cardiovascular: Normal rate  and regular rhythm.  Exam reveals no friction rub.   No murmur heard. Pulmonary/Chest: He is in respiratory distress (tachypneic). He has wheezes. He has  no rales.  Abdominal: He exhibits no distension. There is no tenderness. There is no rebound.  Musculoskeletal: Normal range of motion. He exhibits no edema.  Neurological: He is alert and oriented to person, place, and time.  Skin: He is not diaphoretic.    ED Course  Procedures (including critical care time) Labs Review Labs Reviewed - No data to display Imaging Review No results found.  EKG Interpretation   None      CRITICAL CARE Performed by: Dagmar Hait   Total critical care time: 30 minutes  Critical care time was exclusive of separately billable procedures and treating other patients.  Critical care was necessary to treat or prevent imminent or life-threatening deterioration.  Critical care was time spent personally by me on the following activities: development of treatment plan with patient and/or surrogate as well as nursing, discussions with consultants, evaluation of patient's response to treatment, examination of patient, obtaining history from patient or surrogate, ordering and performing treatments and interventions, ordering and review of laboratory studies, ordering and review of radiographic studies, pulse oximetry and re-evaluation of patient's condition.   Date: 09/09/2013  Rate: 62  Rhythm: normal sinus rhythm  QRS Axis: normal  Intervals: normal  ST/T Wave abnormalities: T waves flipped in lateral leads, similar to previous. Q waves inferiorly, anteriorly, similar to prior.  Conduction Disutrbances:none  Narrative Interpretation:   Old EKG Reviewed: unchanged   MDM   1. Acute respiratory distress   2. COPD exacerbation   3. Community acquired pneumonia    79 year old male with extensive medical history including COPD on chronic steroids, CHF, CAD, AAA presents for shortness of breath. He's had nasal congestion and worsening productive cough since yesterday. He was very short of breath since last night. He received albuterol and  ipratropium with EMS. Here he is tachypnea, talking in short choppy sentences, mild retractions. He has diffuse wheezes. He is in acute respiratory distress. He is able to speak to me, however looks mildly worried. We will give steroids, continuous albuterol. He states multiple admissions for COPD. He denies having any intubations for COPD. Also get chest x-ray to look for pneumonia. Recent Z-pack use for bronchitis, given by PCP. CXR shows interstitial pneumonia. Improving on continuous nebulizer, no need for 2nd hr of continuous nebs. Levaquin given for his pneumonia. Admitted to telemetry.   Dagmar Hait, MD 09/09/13 [id number]

## 2013-09-09 NOTE — ED Notes (Signed)
RT called

## 2013-09-09 NOTE — H&P (Signed)
PCP:   Kari Baars, MD   Chief Complaint:  Shortness of breath  HPI: 79 year old male with history of COPD on chronic steroids, congestive heart failure with last EF approximately 35%, coronary artery disease, AAA with peripheral vascular disease, and multiple hospitalizations for COPD flares, the last one being at the end of October presents with a 2-3 day history of worsening congestion, shortness of breath, did start a Z-Pak approximately 2 days ago but became acutely dyspneic last night, presented to the emergency room for further evaluation and management with significant respiratory distress, requiring 1 hour of continuous albuterol in the setting of IV steroids, he was unable to speak in full sentences when he first arrived, now much improved, but still has significant dyspnea on exertion. Denies any overt chest pain, presyncope, orthopnea, and EKG per emergency room physician unchanged compared with with chest x-ray showing interstitial pneumonia improving on nebulizers, patient given Levaquin for his pneumonia in emergency room and admitted to telemetry. Patient denies to desire DO NOT RESUSCITATE CODE STATUS  Review of Systems:  Denies subjective fevers, chills but apparently did have a fever in emergency room, denies headaches, swallowing difficulties, sore throat, admits to nasal congestion, chest congestion, dyspnea on exertion, denies palpitations chest pain, orthopnea but admits to dyspnea on exertion, with wheezing, denies nausea, vomiting, change in bowel habits, blood in stool, blood in urine, lower extremity edema, focal neurologic deficits. States that he has been compliant with his home regimen he takes oral pills for his diabetes does have a history of needing insulin during hospitalization. Past Medical History: Past Medical History  Diagnosis Date  . Pneumonia 12/06/10    healthcare-associated/ left lower lobe  . COPD (chronic obstructive pulmonary disease)     severe  stage IV  . Atrial fibrillation     on Coumadin with St. Jude single-chamber pacemaker  . Diabetes mellitus     type 2; s/p Prednisone therapy for pneumonia 12/2010  . Coronary artery disease     s/p anterior STEMI 09/2009; cath. revealed mid LAD 40%, distal LAD 100%- PTCA, prox. RCA 40%, mid. RCA 100% with good collateral filling of PDA; EF 25%; NSTEMI 07/2011 - PCI/DES 100% LCX  . ST elevation (STEMI) myocardial infarction 01//11/12    anterior  . CHF (congestive heart failure)     EF 25% on 09/26/10; EF 50-55% and grade 1 diastolic dysfunction on ECHO 08/2010   . Hypertension   . Hyperlipidemia   . AAA (abdominal aortic aneurysm)     3 cm infrarenal abdominal aortic aneurysm per aorta ultrasound 03/2010  . Osteoarthritis     s/p bilat hip arthroplasty  . Angina   . Ischemic cardiomyopathy     EF 35% LHC 11/12  . GERD (gastroesophageal reflux disease)   . Hypercholesteremia   . PVD (peripheral vascular disease)   . Anxiety    Past Surgical History  Procedure Laterality Date  . Hip arthroplasty      s/p right 09/27/10 and s/p left 09/24/10  . Back surgery    . Knee surgery    . Pacemaker insertion  12/2010    St. Jude single-chamber   . Cardiac catheterization  09/2009, 06/2007    PTCA to distal LAD    Medications: Prior to Admission medications   Medication Sig Start Date End Date Taking? Authorizing Provider  amiodarone (PACERONE) 200 MG tablet Take 1 tablet (200 mg total) by mouth daily. 04/14/12  Yes Kari Baars, MD  beclomethasone (QVAR) 80 MCG/ACT  inhaler Inhale 1 puff into the lungs 2 (two) times daily.   Yes Historical Provider, MD  bisoprolol (ZEBETA) 10 MG tablet Take 1 tablet (10 mg total) by mouth daily. 03/19/12 04/08/14 Yes Brooke O Edmisten, PA-C  cholecalciferol (VITAMIN D) 1000 UNITS tablet Take 2,000 Units by mouth daily.   Yes Historical Provider, MD  dabigatran (PRADAXA) 150 MG CAPS Take 150 mg by mouth every 12 (twelve) hours.   Yes Historical Provider,  MD  digoxin (LANOXIN) 0.125 MG tablet Take 0.125 mg by mouth daily.   Yes Historical Provider, MD  finasteride (PROSCAR) 5 MG tablet Take 5 mg by mouth daily.   Yes Historical Provider, MD  furosemide (LASIX) 20 MG tablet Take 20 mg by mouth daily. TAKE one daily 06/15/12  Yes Marinus Maw, MD  guaiFENesin (MUCINEX) 600 MG 12 hr tablet Take 1,200 mg by mouth 2 (two) times daily.   Yes Historical Provider, MD  ipratropium-albuterol (DUONEB) 0.5-2.5 (3) MG/3ML SOLN Take 3 mLs by nebulization every 6 (six) hours. 07/13/13  Yes Kari Baars, MD  isosorbide mononitrate (IMDUR) 60 MG 24 hr tablet Take 60 mg by mouth daily.     Yes Historical Provider, MD  losartan (COZAAR) 25 MG tablet Take 1 tablet (25 mg total) by mouth daily. 09/30/12  Yes W Buren Kos, MD  nitroGLYCERIN (NITROSTAT) 0.4 MG SL tablet Place 0.4 mg under the tongue every 5 (five) minutes as needed. For chest pain   Yes Historical Provider, MD  rosuvastatin (CRESTOR) 20 MG tablet Take 20 mg by mouth daily.   Yes Historical Provider, MD  sennosides-docusate sodium (SENOKOT-S) 8.6-50 MG tablet Take 2 tablets by mouth at bedtime.    Yes Historical Provider, MD  Tamsulosin HCl (FLOMAX) 0.4 MG CAPS Take 0.4 mg by mouth daily.   Yes Historical Provider, MD    Allergies:  No Known Allergies  Social History:  reports that he quit smoking about 4 years ago. His smoking use included Cigarettes. He has a 100 pack-year smoking history. He has never used smokeless tobacco. He reports that he does not drink alcohol or use illicit drugs.  Family History: Family History  Problem Relation Age of Onset  . Heart attack Mother 20  . Heart attack Father 62  . Cancer Other     siblings  . Heart attack Other     Physical Exam: Filed Vitals:   09/09/13 1009 09/09/13 1116 09/09/13 1243 09/09/13 1415  BP:  136/46  148/64  Pulse: 80 74 70 65  Temp:  98.6 F (37 C)  97.9 F (36.6 C)  TempSrc:  Oral  Oral  Resp: 29 18 25 20   Height:    5\' 9"   (1.753 m)  Weight:    79.969 kg (176 lb 4.8 oz)  SpO2: 93% 97% 98% 99%   General appearance-pleasant, lying almost flat in bed no apparent distress, but with audible wheezing and mild tremor able to speak in full sentences at this time Alert and oriented x3 Appears well-developed well-nourished, Sclera anicteric extraconal movements are intact his normocephalic atraumatic Oropharyngeal lesions tongue midline Neck supple, no cervical lymphadenopathy normal range Lungs with audible wheezing, bilaterally, minimal respiratory distress, no Rales Cardiovascular reveals distant heart sounds regular rate and rhythm no murmur appreciated Abdomen soft, nontender, nondistended, bowel sounds are present No axillary lymphadenopathy No lower extremity edema, pedal pulses intact, no cyanosis present at this time Patient can move all 4 extremities against gravity, can ambulate with assistance, normal range of  motion Alert and oriented x3, can follow commands, moves all 4 extremities against gravity, mild resting tremor status post nebulizer administration     Labs on Admission:   Recent Labs  09/09/13 0843  NA 135  K 5.0  CL 99  CO2 22  GLUCOSE 131*  BUN 18  CREATININE 0.94  CALCIUM 8.9   No results found for this basename: AST, ALT, ALKPHOS, BILITOT, PROT, ALBUMIN,  in the last 72 hours No results found for this basename: LIPASE, AMYLASE,  in the last 72 hours  Recent Labs  09/09/13 0843  WBC 16.0*  HGB 13.7  HCT 43.7  MCV 92.8  PLT 173   No results found for this basename: CKTOTAL, CKMB, CKMBINDEX, TROPONINI,  in the last 72 hours No results found for this basename: TSH, T4TOTAL, FREET3, T3FREE, THYROIDAB,  in the last 72 hours No results found for this basename: VITAMINB12, FOLATE, FERRITIN, TIBC, IRON, RETICCTPCT,  in the last 72 hours  Radiological Exams on Admission: Dg Chest Port 1 View  09/09/2013   CLINICAL DATA:  History of COPD and CHF now with increasing dyspnea   EXAM: PORTABLE CHEST - 1 VIEW  COMPARISON:  Chest x-ray of July 09, 2013.  FINDINGS: The lungs are hyperinflated consistent with COPD. The interstitial markings are increased on the right in the mid and lower lung. The central pulmonary vascularity is mildly prominent but there is no definite cephalization. The cardiopericardial silhouette is enlarged. There is mild tortuosity of the descending thoracic aorta. The permanent pacemaker is unchanged in appearance.  IMPRESSION: 1. Mild stable hyperinflation is consistent with COPD. Increased interstitial markings predominantly on the right in the mid and lower lung suggests subsegmental atelectasis as might be seen with acute bronchitis or developing interstitial pneumonia. There is no alveolar pneumonia. 2. Mild stable enlargement of the cardiac silhouette is demonstrated. The pulmonary vascularity is less prominent than in the past. There may be an element of low-grade chronic CHF present.   Electronically Signed   By: David  [place]   On: 09/09/2013 09:24   Orders placed during the hospital encounter of 09/09/13  . ED EKG  . ED EKG   EKG reviewed by emergency room physician was normal sinus rhythm, old flipped T waves in lateral leads, Q waves inferiorly and anterior similar to prior, not reviewed by myself unable to find in  transition from emergency room to floor  Assessment/Plan Active Problems:   COPD exacerbation-recurrent issues, with history of significant tobacco abuse currently off of nicotine related products, but no significant recurrence, will treat conservatively per patient request with steroids, pulmonary toilet, dilators, and  treatment for infection as below. Will provide supportive therapy may need to taper steroids in the next 24-hour may need to taper steroids in the next 24 hours Community acquired pneumonia-initiated on a Z-Pak but had worsening symptomatology, now on Levaquin we'll continue the same, elevated white blood  cell Respiratory distress-secondary to COPD, doubt significant issues with volume overload given history see below CHF history of systolic and diastolic heart failure last ejection fraction approximately 35%, appears compensated with no significant evidence of volume overload at this time however BNP is slightly elevated in the setting of COPD CAD-hemodynamically stable, no chest pain, EKG reportedly without changes, last cardiology note reviewed with stability Atrial fibrillation-history of-sinus rhythm with pacemaker, team is on anticoagulation, no evidence of clinical blood Type 2 diabetes-on high-dose intravenous steroids, will expect hyperglycemia, will start sliding scale insulin Peripheral vascular disease with AAA  stable with no active cyanosis on physical exam or abdominal discomfort   Torsten Weniger R 09/09/2013, 3:05 PM

## 2013-09-09 NOTE — ED Notes (Signed)
MD at bedside. 

## 2013-09-09 NOTE — ED Notes (Signed)
Bed: WA15 Expected date:  Expected time:  Means of arrival:  Comments: EMS-[facility] 

## 2013-09-09 NOTE — ED Notes (Addendum)
Attempted to call report to [facility] on 4th floor.

## 2013-09-09 NOTE — ED Notes (Signed)
Daughter [name] called number is [phone].

## 2013-09-09 NOTE — ED Notes (Signed)
Pt from [facility] via [facility] c/o difficulty breathing x a few day that got worse last night . He has had a productive cough. One Neb treatment  In route 5mg  of Albuterol and 0.5 of Atrovent.

## 2013-09-10 LAB — COMPREHENSIVE METABOLIC PANEL
ALT: 16 U/L (ref 0–53)
BUN: 21 mg/dL (ref 6–23)
CO2: 26 mEq/L (ref 19–32)
Calcium: 8.7 mg/dL (ref 8.4–10.5)
Chloride: 101 mEq/L (ref 96–112)
GFR calc Af Amer: 90 mL/min — ABNORMAL LOW (ref >90)
GFR calc non Af Amer: 78 mL/min — ABNORMAL LOW (ref >90)
Glucose, Bld: 220 mg/dL — ABNORMAL HIGH (ref 70–99)
Potassium: 4.3 mEq/L (ref 3.5–5.1)
Total Protein: 5.8 g/dL — ABNORMAL LOW (ref 6.0–8.3)

## 2013-09-10 LAB — CBC
HCT: 40.1 % (ref 39.0–52.0)
Hemoglobin: 12.6 g/dL — ABNORMAL LOW (ref 13.0–17.0)
MCHC: 31.4 g/dL (ref 30.0–36.0)
Platelets: 155 10*3/uL (ref 150–400)
RDW: 15.3 % (ref 11.5–15.5)
WBC: 13.2 10*3/uL — ABNORMAL HIGH (ref 4.0–10.5)

## 2013-09-10 LAB — GLUCOSE, CAPILLARY
Glucose-Capillary: 184 mg/dL — ABNORMAL HIGH (ref 70–99)
Glucose-Capillary: 185 mg/dL — ABNORMAL HIGH (ref 70–99)

## 2013-09-10 MED ORDER — SACCHAROMYCES BOULARDII 250 MG PO CAPS
250.0000 mg | ORAL_CAPSULE | Freq: Two times a day (BID) | ORAL | Status: DC
  Administered 2013-09-10 – 2013-09-17 (×14): 250 mg via ORAL
  Filled 2013-09-10 (×15): qty 1

## 2013-09-10 MED ORDER — LEVOFLOXACIN 750 MG PO TABS
750.0000 mg | ORAL_TABLET | Freq: Every day | ORAL | Status: DC
  Administered 2013-09-11 – 2013-09-17 (×7): 750 mg via ORAL
  Filled 2013-09-10 (×7): qty 1

## 2013-09-10 MED ORDER — IPRATROPIUM BROMIDE 0.02 % IN SOLN
0.5000 mg | Freq: Four times a day (QID) | RESPIRATORY_TRACT | Status: DC
  Administered 2013-09-10 – 2013-09-14 (×15): 0.5 mg via RESPIRATORY_TRACT
  Filled 2013-09-10 (×15): qty 2.5

## 2013-09-10 NOTE — Progress Notes (Signed)
Subjective: Still very sob with any movement. When at rest okay.  Objective: Vital signs in last 24 hours: Temp:  [97.4 F (36.3 C)-97.6 F (36.4 C)] 97.4 F (36.3 C) (12/26 1402) Pulse Rate:  [61-68] 67 (12/26 1402) Resp:  [20-24] 20 (12/26 1402) BP: (121-143)/(51-62) 135/59 mmHg (12/26 1402) SpO2:  [96 %-100 %] 100 % (12/26 1402) Weight:  [77.338 kg (170 lb 8 oz)] 77.338 kg (170 lb 8 oz) (12/26 0554) Weight change:  Last BM Date: 09/08/13  Intake/Output from previous day: 12/25 0701 - 12/26 0700 In: 240 [P.O.:240] Out: 725 [Urine:725] Intake/Output this shift: Total I/O In: 720 [P.O.:720] Out: 800 [Urine:800]  General appearance: alert, cooperative and appears stated age Resp: diminished breath sounds bilaterally and wheezes bilaterally Cardio: regular rate and rhythm, S1, S2 normal, no murmur, click, rub or gallop GI: soft, non-tender; bowel sounds normal; no masses,  no organomegaly Extremities: extremities normal, atraumatic, no cyanosis or edema Neurologic: Grossly normal   Lab Results:  Recent Labs  09/09/13 0843 09/10/13 0415  WBC 16.0* 13.2*  HGB 13.7 12.6*  HCT 43.7 40.1  PLT 173 155   BMET  Recent Labs  09/09/13 0843 09/10/13 0415  NA 135 137  K 5.0 4.3  CL 99 101  CO2 22 26  GLUCOSE 131* 220*  BUN 18 21  CREATININE 0.94 0.87  CALCIUM 8.9 8.7   CMET CMP     Component Value Date/Time   NA 137 09/10/2013 0415   K 4.3 09/10/2013 0415   CL 101 09/10/2013 0415   CO2 26 09/10/2013 0415   GLUCOSE 220* 09/10/2013 0415   BUN 21 09/10/2013 0415   CREATININE 0.87 09/10/2013 0415   CALCIUM 8.7 09/10/2013 0415   PROT 5.8* 09/10/2013 0415   ALBUMIN 3.0* 09/10/2013 0415   AST 15 09/10/2013 0415   ALT 16 09/10/2013 0415   ALKPHOS 65 09/10/2013 0415   BILITOT 0.7 09/10/2013 0415   GFRNONAA 78* 09/10/2013 0415   GFRAA 90* 09/10/2013 0415    . amiodarone  200 mg Oral Daily  . atorvastatin  40 mg Oral q1800  . bisoprolol  10 mg Oral Daily  .  cholecalciferol  2,000 Units Oral Daily  . dabigatran  150 mg Oral Q12H  . digoxin  0.125 mg Oral Daily  . finasteride  5 mg Oral Daily  . furosemide  20 mg Oral Daily  . guaiFENesin  1,200 mg Oral BID  . insulin aspart  0-15 Units Subcutaneous TID WC  . insulin aspart  0-5 Units Subcutaneous QHS  . ipratropium  0.5 mg Nebulization QID  . isosorbide mononitrate  60 mg Oral Daily  . levofloxacin  750 mg Intravenous Q24H  . losartan  25 mg Oral Daily  . methylPREDNISolone (SOLU-MEDROL) injection  125 mg Intravenous Q6H  . senna-docusate  2 tablet Oral QHS  . sodium chloride  3 mL Intravenous Q12H  . tamsulosin  0.4 mg Oral Daily      Studies/Results: Dg Chest Port 1 View  09/09/2013   CLINICAL DATA:  History of COPD and CHF now with increasing dyspnea  EXAM: PORTABLE CHEST - 1 VIEW  COMPARISON:  Chest x-ray of July 09, 2013.  FINDINGS: The lungs are hyperinflated consistent with COPD. The interstitial markings are increased on the right in the mid and lower lung. The central pulmonary vascularity is mildly prominent but there is no definite cephalization. The cardiopericardial silhouette is enlarged. There is mild tortuosity of the descending thoracic aorta. The permanent  pacemaker is unchanged in appearance.  IMPRESSION: 1. Mild stable hyperinflation is consistent with COPD. Increased interstitial markings predominantly on the right in the mid and lower lung suggests subsegmental atelectasis as might be seen with acute bronchitis or developing interstitial pneumonia. There is no alveolar pneumonia. 2. Mild stable enlargement of the cardiac silhouette is demonstrated. The pulmonary vascularity is less prominent than in the past. There may be an element of low-grade chronic CHF present.   Electronically Signed   By: David  [place]   On: 09/09/2013 09:24    Medications: I have reviewed the patient's current medications.  Assessment/Plan:  Active Problems:   COPD exacerbation-cont.  Current regimen. He needs the high dose steroids a bit longer.   A-fib-stable on current meds.    LOS: 1 day   Darin Kayser, MD 09/10/2013, 6:09 PM

## 2013-09-10 NOTE — Progress Notes (Signed)
Utilization review completed.  

## 2013-09-11 LAB — GLUCOSE, CAPILLARY: Glucose-Capillary: 169 mg/dL — ABNORMAL HIGH (ref 70–99)

## 2013-09-11 MED ORDER — ALBUTEROL SULFATE (2.5 MG/3ML) 0.083% IN NEBU
2.5000 mg | INHALATION_SOLUTION | Freq: Four times a day (QID) | RESPIRATORY_TRACT | Status: DC
  Administered 2013-09-11 – 2013-09-14 (×11): 2.5 mg via RESPIRATORY_TRACT
  Filled 2013-09-11 (×2): qty 3
  Filled 2013-09-11 (×5): qty 0.5
  Filled 2013-09-11: qty 3
  Filled 2013-09-11: qty 0.5
  Filled 2013-09-11: qty 3
  Filled 2013-09-11 (×2): qty 0.5
  Filled 2013-09-11: qty 3
  Filled 2013-09-11: qty 0.5

## 2013-09-11 MED ORDER — METHYLPREDNISOLONE SODIUM SUCC 125 MG IJ SOLR
125.0000 mg | Freq: Three times a day (TID) | INTRAMUSCULAR | Status: DC
  Administered 2013-09-11 – 2013-09-13 (×5): 125 mg via INTRAVENOUS
  Filled 2013-09-11 (×8): qty 2

## 2013-09-11 MED ORDER — ALUM & MAG HYDROXIDE-SIMETH 200-200-20 MG/5ML PO SUSP
30.0000 mL | ORAL | Status: DC | PRN
  Administered 2013-09-11 – 2013-09-17 (×13): 30 mL via ORAL
  Filled 2013-09-11 (×14): qty 30

## 2013-09-11 NOTE — Progress Notes (Addendum)
Subjective: He feels slightly better. No pain.  Objective: Vital signs in last 24 hours: Temp:  [97.3 F (36.3 C)-98 F (36.7 C)] 97.3 F (36.3 C) (12/27 1327) Pulse Rate:  [54-67] 65 (12/27 1327) Resp:  [20-24] 20 (12/27 1327) BP: (124-141)/(59-63) 124/63 mmHg (12/27 1327) SpO2:  [93 %-100 %] 100 % (12/27 1327) Weight:  [77.338 kg (170 lb 8 oz)] 77.338 kg (170 lb 8 oz) (12/27 0538) Weight change: -2.631 kg (-5 lb 12.8 oz) Last BM Date: 09/08/13  Intake/Output from previous day: 12/26 0701 - 12/27 0700 In: 720 [P.O.:720] Out: 1550 [Urine:1550] Intake/Output this shift: Total I/O In: 360 [P.O.:360] Out: -   General appearance: alert, cooperative and appears stated age Resp: a bit better air movement today.  still a lot of wheezes noted throughout all lung fields. Cardio: regular rate and rhythm, S1, S2 normal, no murmur, click, rub or gallop GI: soft, non-tender; bowel sounds normal; no masses,  no organomegaly Extremities: extremities normal, atraumatic, no cyanosis or edema Neurologic: Grossly normal   Lab Results:  Recent Labs  09/09/13 0843 09/10/13 0415  WBC 16.0* 13.2*  HGB 13.7 12.6*  HCT 43.7 40.1  PLT 173 155   BMET  Recent Labs  09/09/13 0843 09/10/13 0415  NA 135 137  K 5.0 4.3  CL 99 101  CO2 22 26  GLUCOSE 131* 220*  BUN 18 21  CREATININE 0.94 0.87  CALCIUM 8.9 8.7   CMET CMP     Component Value Date/Time   NA 137 09/10/2013 0415   K 4.3 09/10/2013 0415   CL 101 09/10/2013 0415   CO2 26 09/10/2013 0415   GLUCOSE 220* 09/10/2013 0415   BUN 21 09/10/2013 0415   CREATININE 0.87 09/10/2013 0415   CALCIUM 8.7 09/10/2013 0415   PROT 5.8* 09/10/2013 0415   ALBUMIN 3.0* 09/10/2013 0415   AST 15 09/10/2013 0415   ALT 16 09/10/2013 0415   ALKPHOS 65 09/10/2013 0415   BILITOT 0.7 09/10/2013 0415   GFRNONAA 78* 09/10/2013 0415   GFRAA 90* 09/10/2013 0415     Studies/Results: No results found.  Medications: I have reviewed the  patient's current medications.  Marland Kitchen albuterol  2.5 mg Nebulization QID  . amiodarone  200 mg Oral Daily  . atorvastatin  40 mg Oral q1800  . bisoprolol  10 mg Oral Daily  . cholecalciferol  2,000 Units Oral Daily  . dabigatran  150 mg Oral Q12H  . digoxin  0.125 mg Oral Daily  . finasteride  5 mg Oral Daily  . furosemide  20 mg Oral Daily  . guaiFENesin  1,200 mg Oral BID  . insulin aspart  0-15 Units Subcutaneous TID WC  . insulin aspart  0-5 Units Subcutaneous QHS  . ipratropium  0.5 mg Nebulization QID  . isosorbide mononitrate  60 mg Oral Daily  . levofloxacin  750 mg Oral Daily  . losartan  25 mg Oral Daily  . methylPREDNISolone (SOLU-MEDROL) injection  125 mg Intravenous Q6H  . saccharomyces boulardii  250 mg Oral BID  . senna-docusate  2 tablet Oral QHS  . sodium chloride  3 mL Intravenous Q12H  . tamsulosin  0.4 mg Oral Daily   CBG (last 3)   Recent Labs  09/10/13 2150 09/11/13 0743 09/11/13 1152  GLUCAP 185* 160* 169*      Assessment/Plan:  Active Problems:   COPD exacerbation-he is gradually improving. But, this is slow progress.  Continue all measures. i  Will reduce the solumedrol  some. Other chronic medical issues are stable.   COPD with acute exacerbation   LOS: 2 days   Ezequiel Kayser, MD 09/11/2013, 1:38 PM

## 2013-09-12 LAB — GLUCOSE, CAPILLARY
Glucose-Capillary: 172 mg/dL — ABNORMAL HIGH (ref 70–99)
Glucose-Capillary: 211 mg/dL — ABNORMAL HIGH (ref 70–99)
Glucose-Capillary: 210 mg/dL — ABNORMAL HIGH (ref 70–99)

## 2013-09-12 NOTE — Progress Notes (Addendum)
Subjective: Slight improvement subjectively.  Objective: Vital signs in last 24 hours: Temp:  [97.4 F (36.3 C)-97.9 F (36.6 C)] 97.9 F (36.6 C) (12/28 1418) Pulse Rate:  [53-65] 60 (12/28 1418) Resp:  [20-24] 20 (12/28 1418) BP: (128-143)/(55-73) 134/64 mmHg (12/28 1418) SpO2:  [97 %-100 %] 99 % (12/28 1418) Weight:  [77.565 kg (171 lb)] 77.565 kg (171 lb) (12/28 0606) Weight change: 0.227 kg (8 oz) Last BM Date: 09/08/13  Intake/Output from previous day: 12/27 0701 - 12/28 0700 In: 600 [P.O.:600] Out: 1535 [Urine:1535] Intake/Output this shift: Total I/O In: 480 [P.O.:480] Out: 800 [Urine:800]  General appearance: alert, cooperative and appears stated age Resp: he is moving air slightly better today. still very tight with a lot of insp. wheeze noted throughout both lung fields. Cardio: regular rate and rhythm, S1, S2 normal, no murmur, click, rub or gallop GI: soft, non-tender; bowel sounds normal; no masses,  no organomegaly Extremities: extremities normal, atraumatic, no cyanosis or edema Neurologic: Grossly normal   Lab Results:  Recent Labs  09/10/13 0415  WBC 13.2*  HGB 12.6*  HCT 40.1  PLT 155   BMET  Recent Labs  09/10/13 0415  NA 137  K 4.3  CL 101  CO2 26  GLUCOSE 220*  BUN 21  CREATININE 0.87  CALCIUM 8.7   CMET CMP     Component Value Date/Time   NA 137 09/10/2013 0415   K 4.3 09/10/2013 0415   CL 101 09/10/2013 0415   CO2 26 09/10/2013 0415   GLUCOSE 220* 09/10/2013 0415   BUN 21 09/10/2013 0415   CREATININE 0.87 09/10/2013 0415   CALCIUM 8.7 09/10/2013 0415   PROT 5.8* 09/10/2013 0415   ALBUMIN 3.0* 09/10/2013 0415   AST 15 09/10/2013 0415   ALT 16 09/10/2013 0415   ALKPHOS 65 09/10/2013 0415   BILITOT 0.7 09/10/2013 0415   GFRNONAA 78* 09/10/2013 0415   GFRAA 90* 09/10/2013 0415     Studies/Results: No results found.  Medications: I have reviewed the patient's current medications.  Marland Kitchen albuterol  2.5 mg Nebulization  QID  . amiodarone  200 mg Oral Daily  . atorvastatin  40 mg Oral q1800  . bisoprolol  10 mg Oral Daily  . cholecalciferol  2,000 Units Oral Daily  . dabigatran  150 mg Oral Q12H  . digoxin  0.125 mg Oral Daily  . finasteride  5 mg Oral Daily  . furosemide  20 mg Oral Daily  . guaiFENesin  1,200 mg Oral BID  . insulin aspart  0-15 Units Subcutaneous TID WC  . insulin aspart  0-5 Units Subcutaneous QHS  . ipratropium  0.5 mg Nebulization QID  . isosorbide mononitrate  60 mg Oral Daily  . levofloxacin  750 mg Oral Daily  . losartan  25 mg Oral Daily  . methylPREDNISolone (SOLU-MEDROL) injection  125 mg Intravenous Q8H  . saccharomyces boulardii  250 mg Oral BID  . senna-docusate  2 tablet Oral QHS  . sodium chloride  3 mL Intravenous Q12H  . tamsulosin  0.4 mg Oral Daily   CBG (last 3)   Recent Labs  09/11/13 2135 09/12/13 0743 09/12/13 1150  GLUCAP 217* 210* 172*      Assessment/Plan:  Active Problems:   COPD exacerbation   COPD with acute exacerbation-severe copd.  He is very slow to improve.  I cannot back down on the steroids today. Continue all current measures and give it more time.  Complete 5 total days of levaquin.  He is on 5 mg prednisone daily chronically at home at this point. However, he still needs the solu-medrol now. Sugars overall acceptable. A-fib-stable.    LOS: 3 days   Ezequiel Kayser, MD 09/12/2013, 3:10 PM

## 2013-09-13 LAB — GLUCOSE, CAPILLARY
Glucose-Capillary: 209 mg/dL — ABNORMAL HIGH (ref 70–99)
Glucose-Capillary: 168 mg/dL — ABNORMAL HIGH (ref 70–99)
Glucose-Capillary: 214 mg/dL — ABNORMAL HIGH (ref 70–99)

## 2013-09-13 LAB — CBC WITH DIFFERENTIAL/PLATELET
Basophils Relative: 0 % (ref 0–1)
Eosinophils Absolute: 0 10*3/uL (ref 0.0–0.7)
Hemoglobin: 13.5 g/dL (ref 13.0–17.0)
Lymphocytes Relative: 2 % — ABNORMAL LOW (ref 12–46)
MCH: 29.1 pg (ref 26.0–34.0)
MCHC: 32 g/dL (ref 30.0–36.0)
Monocytes Relative: 2 % — ABNORMAL LOW (ref 3–12)
Neutrophils Relative %: 96 % — ABNORMAL HIGH (ref 43–77)
WBC: 13.8 10*3/uL — ABNORMAL HIGH (ref 4.0–10.5)

## 2013-09-13 LAB — COMPREHENSIVE METABOLIC PANEL
Albumin: 3.1 g/dL — ABNORMAL LOW (ref 3.5–5.2)
Alkaline Phosphatase: 67 U/L (ref 39–117)
BUN: 29 mg/dL — ABNORMAL HIGH (ref 6–23)
CO2: 30 mEq/L (ref 19–32)
Creatinine, Ser: 0.8 mg/dL (ref 0.50–1.35)
GFR calc non Af Amer: 80 mL/min — ABNORMAL LOW (ref >90)
Potassium: 4.4 mEq/L (ref 3.5–5.1)
Total Protein: 5.7 g/dL — ABNORMAL LOW (ref 6.0–8.3)

## 2013-09-13 MED ORDER — METHYLPREDNISOLONE SODIUM SUCC 125 MG IJ SOLR
80.0000 mg | Freq: Three times a day (TID) | INTRAMUSCULAR | Status: DC
  Administered 2013-09-13 (×2): 80 mg via INTRAVENOUS
  Administered 2013-09-14: 06:00:00 via INTRAVENOUS
  Filled 2013-09-13 (×6): qty 1.28

## 2013-09-13 NOTE — Progress Notes (Signed)
Subjective: Feeling a little better.  Less wheezing.  Coughing up thick yellow sputum.  Objective: Vital signs in last 24 hours: Temp:  [97.5 F (36.4 C)-97.9 F (36.6 C)] 97.5 F (36.4 C) (12/29 0458) Pulse Rate:  [60-65] 65 (12/29 0458) Resp:  [20] 20 (12/29 0458) BP: (134-143)/(57-76) 143/76 mmHg (12/29 0458) SpO2:  [97 %-100 %] 97 % (12/29 0855) Weight:  [77.157 kg (170 lb 1.6 oz)] 77.157 kg (170 lb 1.6 oz) (12/29 0458) Weight change: -0.408 kg (-14.4 oz) Last BM Date: 09/12/13  CBG (last 3)   Recent Labs  09/12/13 1702 09/12/13 2152 09/13/13 0752  GLUCAP 211* 210* 209*    Intake/Output from previous day: 12/28 0701 - 12/29 0700 In: 480 [P.O.:480] Out: 1100 [Urine:1100] Intake/Output this shift: Total I/O In: -  Out: 275 [Urine:275]  General appearance: alert and mild tachypnea with talking but able to speak in full sentences Eyes: no scleral icterus Throat: oropharynx moist without erythema Resp: minimal bilateral expiratory wheezing; good air movement Cardio: regular rate and rhythm GI: soft, non-tender; bowel sounds normal; no masses,  no organomegaly Extremities: no clubbing, cyanosis or edema   Lab Results:  Recent Labs  09/13/13 0357  NA 134*  K 4.4  CL 98  CO2 30  GLUCOSE 206*  BUN 29*  CREATININE 0.80  CALCIUM 8.6    Recent Labs  09/13/13 0357  AST 22  ALT 32  ALKPHOS 67  BILITOT 0.4  PROT 5.7*  ALBUMIN 3.1*    Recent Labs  09/13/13 0357  WBC 13.8*  NEUTROABS 13.3*  HGB 13.5  HCT 42.2  MCV 90.9  PLT 153   Lab Results  Component Value Date   INR 2.03* 09/28/2012   INR 2.43* 09/27/2012   INR 2.39* 04/14/2012   No results found for this basename: CKTOTAL, CKMB, CKMBINDEX, TROPONINI,  in the last 72 hours No results found for this basename: TSH, T4TOTAL, FREET3, T3FREE, THYROIDAB,  in the last 72 hours No results found for this basename: VITAMINB12, FOLATE, FERRITIN, TIBC, IRON, RETICCTPCT,  in the last 72  hours  Studies/Results: No results found.   Medications: Scheduled: . albuterol  2.5 mg Nebulization QID  . amiodarone  200 mg Oral Daily  . atorvastatin  40 mg Oral q1800  . bisoprolol  10 mg Oral Daily  . cholecalciferol  2,000 Units Oral Daily  . dabigatran  150 mg Oral Q12H  . digoxin  0.125 mg Oral Daily  . finasteride  5 mg Oral Daily  . furosemide  20 mg Oral Daily  . guaiFENesin  1,200 mg Oral BID  . insulin aspart  0-15 Units Subcutaneous TID WC  . insulin aspart  0-5 Units Subcutaneous QHS  . ipratropium  0.5 mg Nebulization QID  . isosorbide mononitrate  60 mg Oral Daily  . levofloxacin  750 mg Oral Daily  . losartan  25 mg Oral Daily  . methylPREDNISolone (SOLU-MEDROL) injection  125 mg Intravenous Q8H  . saccharomyces boulardii  250 mg Oral BID  . senna-docusate  2 tablet Oral QHS  . sodium chloride  3 mL Intravenous Q12H  . tamsulosin  0.4 mg Oral Daily   Continuous: . sodium chloride      Assessment/Plan: Principal Problem: 1. COPD with acute exacerbation- slowly improving.  Will wean Solumedrol dose to 80mg  q8 and transition to oral steroids soon.  Continue Levaquin, nebulizers, O2.   Active Problems: 2. Type 2 diabetes mellitus- steroid induced hyperglycemia.  Continue SSI. 3. Permanent atrial  fibrillation on Chronic anticoagulation, s/p Pacer- continue Amiodarone, pradaxa 4. Chronic combined systolic and diastolic congestive heart failure- well compensated.  Continue medical therapy with Bisoprolol, ARB, diuretics. 5. Ethics- DNR.  He reiterates his desire for natural death and transition to palliative care if approaching end of life.  I don't think we are there yet despite frequency of exacerbations but confirmed his desires.   LOS: 4 days   [name],W [name] 09/13/2013, 11:10 AM

## 2013-09-14 LAB — BASIC METABOLIC PANEL
Calcium: 8.4 mg/dL (ref 8.4–10.5)
GFR calc Af Amer: 90 mL/min — ABNORMAL LOW (ref >90)
GFR calc non Af Amer: 78 mL/min — ABNORMAL LOW (ref >90)
Glucose, Bld: 240 mg/dL — ABNORMAL HIGH (ref 70–99)
Sodium: 137 mEq/L (ref 137–147)

## 2013-09-14 LAB — CBC
MCH: 29.1 pg (ref 26.0–34.0)
MCHC: 31.9 g/dL (ref 30.0–36.0)
Platelets: 154 10*3/uL (ref 150–400)
RDW: 14.7 % (ref 11.5–15.5)

## 2013-09-14 LAB — GLUCOSE, CAPILLARY

## 2013-09-14 MED ORDER — IPRATROPIUM-ALBUTEROL 0.5-2.5 (3) MG/3ML IN SOLN
3.0000 mL | Freq: Four times a day (QID) | RESPIRATORY_TRACT | Status: DC
  Administered 2013-09-14: 3 mL via RESPIRATORY_TRACT
  Filled 2013-09-14: qty 3

## 2013-09-14 MED ORDER — LEVALBUTEROL HCL 0.63 MG/3ML IN NEBU
0.6300 mg | INHALATION_SOLUTION | Freq: Four times a day (QID) | RESPIRATORY_TRACT | Status: DC
  Administered 2013-09-14 – 2013-09-15 (×4): 0.63 mg via RESPIRATORY_TRACT
  Filled 2013-09-14 (×8): qty 3

## 2013-09-14 MED ORDER — IPRATROPIUM BROMIDE 0.02 % IN SOLN
0.5000 mg | Freq: Four times a day (QID) | RESPIRATORY_TRACT | Status: DC
  Administered 2013-09-14 – 2013-09-17 (×13): 0.5 mg via RESPIRATORY_TRACT
  Filled 2013-09-14 (×13): qty 2.5

## 2013-09-14 MED ORDER — PREDNISONE 50 MG PO TABS
60.0000 mg | ORAL_TABLET | Freq: Every day | ORAL | Status: DC
  Administered 2013-09-14 – 2013-09-17 (×4): 60 mg via ORAL
  Filled 2013-09-14 (×5): qty 1

## 2013-09-14 NOTE — Evaluation (Signed)
Physical Therapy Evaluation Patient Details Name: [name] MRN: [id number] DOB: [birth year] Today's Date: 09/14/2013 Time: 4098-1191 PT Time Calculation (min): 8 min  PT Assessment / Plan / Recommendation History of Present Illness  79 yo male admitted with COPD exacerbation.  Hx has a h/o cad, aaa, dm, htn, pvd. [facility] from [facility].   Clinical Impression  On eval, pt required Min assist for mobility-able to ambulate ~45 feet with RW. Demonstrates general weakness, decreased activity tolerance, and impaired gait and balance. One instance of LE buckling, while walking with PT, requiring Min assist to prevent fall. At this time, recommend ST rehab [facility]. May be able to d/c back to [facility] with HH[facility] if pt progresses well and can mobilize safely without assist.     PT Assessment  Patient needs continued PT services    Follow Up Recommendations  SNF (HHPT if pt progresses well and is able to safely mobilize without assist. )    Does the patient have the potential to tolerate intense rehabilitation      Barriers to Discharge        Equipment Recommendations  None recommended by PT    Recommendations for Other Services OT consult   Frequency Min 3X/week    Precautions / Restrictions Precautions Precautions: Fall Restrictions Weight Bearing Restrictions: No   Pertinent Vitals/Pain Back-chronic-unrated.  96% RA at rest; 90% RA during ambulation      Mobility  Bed Mobility Bed Mobility: Not assessed Supine to Sit: 7: Independent Transfers Transfers: Sit to Stand;Stand to Sit Sit to Stand: 4: Min assist;From chair/3-in-1 Stand to Sit: 4: Min assist;To chair/3-in-1 Details for Transfer Assistance: Assist to steady. VCs safety, hand placement Ambulation/Gait Ambulation/Gait Assistance: 4: Min assist Ambulation Distance (Feet): 45 Feet Assistive device: Rolling walker Ambulation/Gait Assistance Details: 1 instance of LE buckling requiring Min assist to prevent  fall. slow gait speed. fatigues easily. dyspnea 3/4. O2 sats RA 90 %.  Gait Pattern: Step-through pattern;Trunk flexed;Decreased stride length    Exercises     PT Diagnosis: Difficulty walking;Abnormality of gait;Generalized weakness  PT Problem List: Decreased strength;Decreased activity tolerance;Decreased balance;Decreased mobility;Decreased knowledge of use of DME PT Treatment Interventions: DME instruction;Gait training;Functional mobility training;Therapeutic activities;Therapeutic exercise;Balance training;Patient/family education     PT Goals(Current goals can be found in the care plan section) Acute Rehab PT Goals Patient Stated Goal: back to [name] PT Goal Formulation: With patient Time For Goal Achievement: 09/28/13 Potential to Achieve Goals: Good  Visit Information  Last PT Received On: 09/14/13 Assistance Needed: +1 History of Present Illness: pt was admitted with COPD exacerbation.  He has a h/o cad, aaa, dm, htn       Prior Functioning  Home Living Family/patient expects to be discharged to:: Unsure Type of Home: Independent living facility Home Access: Level entry Home Layout: One level Home Equipment: Walker - 4 wheels;Grab bars - toilet;Grab bars - tub/shower;Shower seat;Shower seat - built in Prior Function Level of Independence: Independent with assistive device(s) Communication Communication: HOH    Cognition  Cognition Arousal/Alertness: Awake/alert Behavior During Therapy: WFL for tasks assessed/performed Overall Cognitive Status: Within Functional Limits for tasks assessed    Extremity/Trunk Assessment Upper Extremity Assessment Upper Extremity Assessment: Defer to OT evaluation Lower Extremity Assessment Lower Extremity Assessment: Generalized weakness Cervical / Trunk Assessment Cervical / Trunk Assessment: Normal   Balance    End of Session PT - End of Session Activity Tolerance: Patient limited by fatigue Patient left: in  chair;with call  bell/phone within reach Nurse Communication: Mobility status  GP     Rebeca Alert, MPT Pager: [phone]

## 2013-09-14 NOTE — Progress Notes (Signed)
Dr. Link Snuffer returned page. No new orders received. Will continue to monitor.

## 2013-09-14 NOTE — Evaluation (Signed)
Occupational Therapy Evaluation Patient Details Name: [name] MRN: [id number] DOB: [birth year] Today's Date: 09/14/2013 Time: 3244-0102 OT Time Calculation (min): 20 min  OT Assessment / Plan / Recommendation History of present illness pt was admitted with COPD exacerbation.  He has a h/o cad, aaa, dm, htn   Clinical Impression   Pt was admitted with the above.  At baseline, he does not wear 02 and is mod I with adls although he reports it is a struggle.  He will benefit from skilled OT to increase safety and independence with adls with supervision level goals in acute.      OT Assessment  Patient needs continued OT Services    Follow Up Recommendations  Home health OT;SNF (hopefully, HHOT, depending upon progress)    Barriers to Discharge      Equipment Recommendations   (will further assess commode:  pt has 19 1/2" and bar)    Recommendations for Other Services    Frequency  Min 2X/week    Precautions / Restrictions Precautions Precautions: Fall Restrictions Weight Bearing Restrictions: No   Pertinent Vitals/Pain Pt has a constant back pain but not bad during eval     ADL  Toilet Transfer: Minimal assistance;Simulated Toilet Transfer Method: Sit to stand Equipment Used: Rolling walker Transfers/Ambulation Related to ADLs: Pt ambulated 5' to recliner with min A. Reports he had difficulty getting up from our commode earlier.  Placed 3:1 over this.   ADL Comments: Pt is able to cross legs for adls:  overall set up for UB adls and min A for LB adls    OT Diagnosis: Generalized weakness  OT Problem List: Decreased strength;Decreased activity tolerance;Impaired balance (sitting and/or standing);Pain OT Treatment Interventions: Self-care/ADL training;DME and/or AE instruction;Patient/family education;Other (comment)   OT Goals(Current goals can be found in the care plan section) Acute Rehab OT Goals Patient Stated Goal: back to [name] OT Goal Formulation:  With patient Time For Goal Achievement: 09/28/13 Potential to Achieve Goals: Good ADL Goals Pt Will Perform Grooming: with supervision;standing Pt Will Perform Lower Body Bathing: with supervision;sit to/from stand Pt Will Perform Lower Body Dressing: with supervision;sit to/from stand Pt Will Transfer to Toilet: with supervision;ambulating;grab bars (high commode, 19 1/2) Pt Will Perform Toileting - Clothing Manipulation and hygiene: with supervision;sit to/from stand  Visit Information  Last OT Received On: 09/14/13 Assistance Needed: +1 History of Present Illness: pt was admitted with COPD exacerbation.  He has a h/o cad, aaa, dm, htn       Prior Functioning     Home Living Family/patient expects to be discharged to::  ([name] independent living) Prior Function Level of Independence: Independent with assistive device(s) Communication Communication: HOH         Vision/Perception     Cognition  Cognition Arousal/Alertness: Awake/alert Behavior During Therapy: WFL for tasks assessed/performed Overall Cognitive Status: Within Functional Limits for tasks assessed    Extremity/Trunk Assessment Upper Extremity Assessment Upper Extremity Assessment: Overall WFL for tasks assessed     Mobility Bed Mobility Bed Mobility: Supine to Sit Supine to Sit: 7: Independent Transfers Transfers: Sit to Stand;Stand to Sit Sit to Stand: 4: Min assist Stand to Sit: 4: Min guard Details for Transfer Assistance: assist to steady     Exercise     Balance     End of Session OT - End of Session Activity Tolerance: Patient tolerated treatment well Patient left: in chair;with call bell/phone within reach Nurse Communication:  (pt up in chair with call  bell)  GO     [name] 09/14/2013, 10:48 AM Marica Otter, OTR/L [phone] 09/14/2013

## 2013-09-14 NOTE — Progress Notes (Signed)
CSW reviewed PT evaluation recommending SNF (HHPT if patient progresses well and is able to safely mobilize with assist). CSW met with patient who states that he would prefer to return to his independent living apartment at [facility] rather than go to SNF. Patient informed CSW that he has been to [facility] & [facility] in the past.   Patient states that his daughter, [name] would be able to transport him back to his apartment. He is anticipating discharge tomorrow.   CSW will follow-up in the morning to make sure d/c plan is still to return to independent living apartment.   Unice Bailey, LCSW [facility] Clinical Social Worker cell #: [phone]

## 2013-09-14 NOTE — Progress Notes (Addendum)
Note by telemetry clerk that pt pace maker was no longer firing. Pt A-Fib with a heart  rate ranging from 89-105. VSS. Pt asymptomatic. Dr. Link Snuffer paged. Awaiting return call. Will continue to monitor.

## 2013-09-14 NOTE — Progress Notes (Signed)
Subjective: Breathing improved.  Noted heart rate is higher- generally 50s-60s, now 90s-100s.  No symptoms related.    Objective: Vital signs in last 24 hours: Temp:  [97.4 F (36.3 C)-98.1 F (36.7 C)] 98.1 F (36.7 C) (12/30 0616) Pulse Rate:  [59-100] 91 (12/30 0616) Resp:  [18-20] 18 (12/30 0616) BP: (121-138)/(50-80) 131/80 mmHg (12/30 0616) SpO2:  [95 %-100 %] 99 % (12/30 0616) Weight change:  Last BM Date: 09/12/13  CBG (last 3)   Recent Labs  09/13/13 1142 09/13/13 1639 09/13/13 2155  GLUCAP 168* 209* 214*    Intake/Output from previous day: 12/29 0701 - 12/30 0700 In: 480 [P.O.:480] Out: 2250 [Urine:2250] Intake/Output this shift: Total I/O In: -  Out: 1300 [Urine:1300]  General appearance: alert and no distress Eyes: no scleral icterus Throat: oropharynx moist without erythema Resp: bilateral expiratory wheezing slightly improved Cardio: irregularly irregular rhythm GI: soft, non-tender; bowel sounds normal; no masses,  no organomegaly Extremities: no clubbing, cyanosis or edema   Lab Results:  Recent Labs  09/13/13 0357 09/14/13 0450  NA 134* 137  K 4.4 4.5  CL 98 95*  CO2 30 34*  GLUCOSE 206* 240*  BUN 29* 27*  CREATININE 0.80 0.87  CALCIUM 8.6 8.4    Recent Labs  09/13/13 0357  AST 22  ALT 32  ALKPHOS 67  BILITOT 0.4  PROT 5.7*  ALBUMIN 3.1*    Recent Labs  09/13/13 0357 09/14/13 0450  WBC 13.8* 12.5*  NEUTROABS 13.3*  --   HGB 13.5 13.6  HCT 42.2 42.7  MCV 90.9 91.4  PLT 153 154   Lab Results  Component Value Date   INR 2.03* 09/28/2012   INR 2.43* 09/27/2012   INR 2.39* 04/14/2012   No results found for this basename: CKTOTAL, CKMB, CKMBINDEX, TROPONINI,  in the last 72 hours No results found for this basename: TSH, T4TOTAL, FREET3, T3FREE, THYROIDAB,  in the last 72 hours No results found for this basename: VITAMINB12, FOLATE, FERRITIN, TIBC, IRON, RETICCTPCT,  in the last 72 hours  Studies/Results: No results  found.   Medications: Scheduled: . amiodarone  200 mg Oral Daily  . atorvastatin  40 mg Oral q1800  . bisoprolol  10 mg Oral Daily  . cholecalciferol  2,000 Units Oral Daily  . dabigatran  150 mg Oral Q12H  . digoxin  0.125 mg Oral Daily  . finasteride  5 mg Oral Daily  . furosemide  20 mg Oral Daily  . guaiFENesin  1,200 mg Oral BID  . insulin aspart  0-15 Units Subcutaneous TID WC  . insulin aspart  0-5 Units Subcutaneous QHS  . ipratropium-albuterol  3 mL Nebulization QID  . isosorbide mononitrate  60 mg Oral Daily  . levofloxacin  750 mg Oral Daily  . losartan  25 mg Oral Daily  . methylPREDNISolone (SOLU-MEDROL) injection  80 mg Intravenous Q8H  . saccharomyces boulardii  250 mg Oral BID  . senna-docusate  2 tablet Oral QHS  . sodium chloride  3 mL Intravenous Q12H  . tamsulosin  0.4 mg Oral Daily   Continuous: . sodium chloride      Assessment/Plan: Principal Problem:  1. COPD with acute exacerbation- slowly improving. Will transition to po steroids. Continue Levaquin, nebulizers, O2.  Change albuterol to Xopenex due to tachycardia. Active Problems:  2. Type 2 diabetes mellitus- steroid induced hyperglycemia. Continue SSI.  3. Permanent atrial fibrillation on Chronic anticoagulation, s/p Pacer- Rate increased despite Amio, Bisoprolol and Digoxin.  Likely secondary  to #1/albuterol.  Will change Albuterol to Xopenex nebs.  Continue pradaxa. 4. Chronic combined systolic and diastolic congestive heart failure- well compensated. Continue medical therapy with Bisoprolol, ARB, diuretics.  5. Ethics- DNR.  6. Disposition- possible discharge in 1-2 days if improving.   LOS: 5 days   SHAW,[name] 09/14/2013, 6:21 AM

## 2013-09-15 LAB — BASIC METABOLIC PANEL
CO2: 34 mEq/L — ABNORMAL HIGH (ref 19–32)
Calcium: 8.3 mg/dL — ABNORMAL LOW (ref 8.4–10.5)
Chloride: 96 mEq/L (ref 96–112)
Creatinine, Ser: 0.72 mg/dL (ref 0.50–1.35)
Glucose, Bld: 143 mg/dL — ABNORMAL HIGH (ref 70–99)
Sodium: 136 mEq/L — ABNORMAL LOW (ref 137–147)

## 2013-09-15 LAB — GLUCOSE, CAPILLARY
Glucose-Capillary: 124 mg/dL — ABNORMAL HIGH (ref 70–99)
Glucose-Capillary: 151 mg/dL — ABNORMAL HIGH (ref 70–99)
Glucose-Capillary: 180 mg/dL — ABNORMAL HIGH (ref 70–99)

## 2013-09-15 LAB — CBC
HCT: 44.5 % (ref 39.0–52.0)
Hemoglobin: 14 g/dL (ref 13.0–17.0)
MCH: 28.9 pg (ref 26.0–34.0)
MCHC: 31.5 g/dL (ref 30.0–36.0)
MCV: 91.9 fL (ref 78.0–100.0)
RBC: 4.84 MIL/uL (ref 4.22–5.81)
WBC: 13.6 10*3/uL — ABNORMAL HIGH (ref 4.0–10.5)

## 2013-09-15 LAB — CULTURE, BLOOD (ROUTINE X 2): Culture: NO GROWTH

## 2013-09-15 MED ORDER — BUDESONIDE 0.25 MG/2ML IN SUSP
0.2500 mg | Freq: Two times a day (BID) | RESPIRATORY_TRACT | Status: DC
  Administered 2013-09-15 – 2013-09-16 (×3): 0.25 mg via RESPIRATORY_TRACT
  Filled 2013-09-15 (×9): qty 2

## 2013-09-15 MED ORDER — LEVALBUTEROL HCL 0.63 MG/3ML IN NEBU
INHALATION_SOLUTION | RESPIRATORY_TRACT | Status: AC
  Filled 2013-09-15: qty 3

## 2013-09-15 MED ORDER — GLIMEPIRIDE 2 MG PO TABS
2.0000 mg | ORAL_TABLET | Freq: Every day | ORAL | Status: DC
  Administered 2013-09-15 – 2013-09-17 (×3): 2 mg via ORAL
  Filled 2013-09-15 (×4): qty 1

## 2013-09-15 MED ORDER — LEVALBUTEROL HCL 1.25 MG/0.5ML IN NEBU
1.2500 mg | INHALATION_SOLUTION | Freq: Four times a day (QID) | RESPIRATORY_TRACT | Status: DC
  Administered 2013-09-15 – 2013-09-17 (×9): 1.25 mg via RESPIRATORY_TRACT
  Filled 2013-09-15 (×13): qty 0.5

## 2013-09-15 NOTE — Progress Notes (Signed)
Patient will have a bed at [facility] on Friday.   *Dr. [name] - please sign FL2 and DNR on shadow chart in [facility].   Clinical Social Work Department CLINICAL SOCIAL WORK PLACEMENT NOTE 09/15/2013  Patient:  TEHRAN, RABENOLD  Account Number:  [id number] Admit date:  09/09/2013  Clinical Social Worker:  Orpah Greek  Date/time:  09/15/2013 03:44 PM  Clinical Social Work is seeking post-discharge placement for this patient at the following level of care:   SKILLED NURSING   (*CSW will update this form in Epic as items are completed)   09/15/2013  Patient/family provided with [facility] Department of Clinical Social Work's list of facilities offering this level of care within the geographic area requested by the patient (or if unable, by the patient's family).  09/15/2013  Patient/family informed of their freedom to choose among providers that offer the needed level of care, that participate in Medicare, Medicaid or managed care program needed by the patient, have an available bed and are willing to accept the patient.  09/15/2013  Patient/family informed of [facility]' ownership interest in [facility], as well as of the fact that they are under no obligation to receive care at this facility.  PASARR submitted to EDS on 09/15/2013 PASARR number received from [employer]DS on 09/15/2013  FL2 transmitted to all facilities in geographic area requested by pt/family on  09/15/2013 FL2 transmitted to all facilities within larger geographic area on   Patient informed that his/her managed care company has contracts with or will negotiate with  certain facilities, including the following:     Patient/family informed of bed offers received:  09/15/2013 Patient chooses bed at [facility] Physician recommends and patient chooses bed at    Patient to be transferred to [facility] on  09/17/2013 Patient to be transferred to facility by   The following physician request were  entered in Epic:   Additional Comments:   Unice Bailey, LCSW [facility] Clinical Social Worker cell #: [phone]

## 2013-09-15 NOTE — Progress Notes (Signed)
Clinical Social Work Department BRIEF PSYCHOSOCIAL ASSESSMENT 09/15/2013  Patient:  [name], [name]     Account Number:  [id number]     Admit date:  09/09/2013  Clinical Social Worker:  Orpah Greek  Date/Time:  09/15/2013 03:38 PM  Referred by:  Physician  Date Referred:  09/15/2013 Referred for  Other - See comment   Other Referral:   Admitted from: Independent Living @ [facility]   Interview type:  Patient Other interview type:   and daughter, [name] via phone    PSYCHOSOCIAL DATA Living Status:  FACILITY Admitted from facility:  [facility] Level of care:  Independent Living Primary support name:  [name] (daughter) h#: [phone] c#: [phone] Primary support relationship to patient:  CHILD, ADULT Degree of support available:   good    CURRENT CONCERNS Current Concerns  Post-Acute Placement   Other Concerns:    SOCIAL WORK ASSESSMENT / PLAN CSW received referral for SNF placement.   Assessment/plan status:  Information/Referral to [facility] Other assessment/ plan:   Information/referral to community resources:   CSW completed FL2 and faxed information out to [facility] - provided list of facilities and touched base with Jasmine December @ [facility] per daughter's request.    PATIENT'S/FAMILY'S RESPONSE TO PLAN OF CARE: Patient was hesitant at first to commit to SNF rather than returning to Independent Living but he states that after walking to the bathroom with the nurse tech, he was winded and realized that going to SNF would be a good idea.    Anticipating discharge Friday to [facility].       Unice Bailey, LCSW [facility] Clinical Social Worker cell #: [phone]

## 2013-09-15 NOTE — Progress Notes (Signed)
Physical Therapy Treatment Patient Details Name: TRYSTYN DOLLEY MRN: [id number] DOB: May 05, 1928 Today's Date: 09/15/2013 Time: 9528-4132 PT Time Calculation (min): 24 min  PT Assessment / Plan / Recommendation  History of Present Illness pt was admitted with COPD exacerbation.  He has a h/o cad, aaa, dm, htn   PT Comments   Progressing slowly with mobility. Able to increase activity somewhat this session however pt still only able to walk ~50-55 feet at any one time. Fatigues easily and dyspnea 2-3/4 with mobility. O2 sats at least 90% on RA with mobility. Continue to feel pt may need short stay at SNF prior to returning to ILF-it pt is agreeable. Otherwise, will need HHPT and possibly a home health aide if pt declines rehab.   Follow Up Recommendations  SNF     Does the patient have the potential to tolerate intense rehabilitation     Barriers to Discharge        Equipment Recommendations  None recommended by PT    Recommendations for Other Services    Frequency Min 3X/week   Progress towards PT Goals Progress towards PT goals: Progressing toward goals (slowly)  Plan Current plan remains appropriate    Precautions / Restrictions Precautions Precautions: Fall Restrictions Weight Bearing Restrictions: No   Pertinent Vitals/Pain Pt denies pain    Mobility  Bed Mobility Supine to Sit: HOB elevated;5: Supervision Details for Bed Mobility Assistance: HOB 60 degrees.  Transfers Transfers: Sit to Stand;Stand to Sit Sit to Stand: From chair/3-in-1;From bed;From elevated surface;4: Min guard Stand to Sit: To chair/3-in-1;4: Min guard Details for Transfer Assistance: x2. close guard for safety. Pt able to rise from recliner without assist but with some difficulty and increased time.  Ambulation/Gait Ambulation/Gait Assistance: 4: Min guard Ambulation Distance (Feet): 55 Feet (x 2) Ambulation/Gait Assistance Details: 2 short walks this session. Seated rest break between walks-O2  sats 90% on RA. Dyspnea 2-3/4. slow gaist speed. Very close guarding required.  Gait Pattern: Step-through pattern;Decreased stride length;Trunk flexed    Exercises     PT Diagnosis:    PT Problem List:   PT Treatment Interventions:     PT Goals (current goals can now be found in the care plan section)    Visit Information  Last PT Received On: 09/15/13 Assistance Needed: +1 History of Present Illness: pt was admitted with COPD exacerbation.  He has a h/o cad, aaa, dm, htn    Subjective Data      Cognition  Cognition Arousal/Alertness: Awake/alert Behavior During Therapy: WFL for tasks assessed/performed Overall Cognitive Status: Within Functional Limits for tasks assessed    Balance     End of Session PT - End of Session Activity Tolerance: Patient limited by fatigue Patient left: in chair;with call bell/phone within reach   GP     Rebeca Alert, MPT Pager: [phone]

## 2013-09-15 NOTE — Progress Notes (Signed)
Subjective: Feels weak.  Increased shortness of breath today.  Required assistance to walk yesterday.  Objective: Vital signs in last 24 hours: Temp:  [97.8 F (36.6 C)-98.5 F (36.9 C)] 98.5 F (36.9 C) (12/31 0440) Pulse Rate:  [81-95] 85 (12/31 0440) Resp:  [16-20] 16 (12/31 0440) BP: (135-149)/(79-88) 149/79 mmHg (12/31 0440) SpO2:  [97 %-100 %] 100 % (12/31 0440) Weight:  [77.474 kg (170 lb 12.8 oz)] 77.474 kg (170 lb 12.8 oz) (12/31 0440) Weight change:  Last BM Date: 09/12/13  CBG (last 3)   Recent Labs  09/14/13 1237 09/14/13 1624 09/14/13 2132  GLUCAP 201* 192* 214*    Intake/Output from previous day: 12/30 0701 - 12/31 0700 In: 600 [P.O.:600] Out: 1650 [Urine:1650] Intake/Output this shift:    General appearance: fatigued and mild tachypnea, shorter sentences Eyes: no scleral icterus Throat: oropharynx moist without erythema Resp: slight increased expiratory wheezing Cardio: irregularly irregular rhythm GI: soft, non-tender; bowel sounds normal; no masses,  no organomegaly Extremities: no clubbing, cyanosis or edema   Lab Results:  Recent Labs  09/14/13 0450 09/15/13 0434  NA 137 136*  K 4.5 4.1  CL 95* 96  CO2 34* 34*  GLUCOSE 240* 143*  BUN 27* 26*  CREATININE 0.87 0.72  CALCIUM 8.4 8.3*    Recent Labs  09/13/13 0357  AST 22  ALT 32  ALKPHOS 67  BILITOT 0.4  PROT 5.7*  ALBUMIN 3.1*    Recent Labs  09/13/13 0357 09/14/13 0450 09/15/13 0434  WBC 13.8* 12.5* 13.6*  NEUTROABS 13.3*  --   --   HGB 13.5 13.6 14.0  HCT 42.2 42.7 44.5  MCV 90.9 91.4 91.9  PLT 153 154 145*   Lab Results  Component Value Date   INR 2.03* 09/28/2012   INR 2.43* 09/27/2012   INR 2.39* 04/14/2012   No results found for this basename: CKTOTAL, CKMB, CKMBINDEX, TROPONINI,  in the last 72 hours No results found for this basename: TSH, T4TOTAL, FREET3, T3FREE, THYROIDAB,  in the last 72 hours No results found for this basename: VITAMINB12, FOLATE,  FERRITIN, TIBC, IRON, RETICCTPCT,  in the last 72 hours  Studies/Results: No results found.   Medications: Scheduled: . amiodarone  200 mg Oral Daily  . atorvastatin  40 mg Oral q1800  . bisoprolol  10 mg Oral Daily  . cholecalciferol  2,000 Units Oral Daily  . dabigatran  150 mg Oral Q12H  . digoxin  0.125 mg Oral Daily  . finasteride  5 mg Oral Daily  . furosemide  20 mg Oral Daily  . guaiFENesin  1,200 mg Oral BID  . insulin aspart  0-15 Units Subcutaneous TID WC  . insulin aspart  0-5 Units Subcutaneous QHS  . ipratropium  0.5 mg Nebulization Q6H  . isosorbide mononitrate  60 mg Oral Daily  . levalbuterol  0.63 mg Nebulization Q6H  . levofloxacin  750 mg Oral Daily  . losartan  25 mg Oral Daily  . predniSONE  60 mg Oral QAC breakfast  . saccharomyces boulardii  250 mg Oral BID  . senna-docusate  2 tablet Oral QHS  . sodium chloride  3 mL Intravenous Q12H  . tamsulosin  0.4 mg Oral Daily   Continuous: . sodium chloride      Assessment/Plan: Principal Problem: 1. COPD with acute exacerbation- seems to be a little worse today with increased wheezing and more dyspnea.  Will increase Xopenex dose.  Restart Qvar.  Continue oral steroids and antibiotics.  Active Problems: 2. Type 2 diabetes mellitus- resume glimeperide home med.  Continue SSI. 3.  Permanent atrial fibrillation on chronic anticoagulation s/p pacer- rate improved with change from Albuterol to Xopenex.  Continue Amiodarone, Bisoprolol, Digoxin, Pradaxa. 4. Chronic combined systolic and diastolic congestive heart failure- euvolemic.  Continue medical therapy. 5. Disposition- he is weaker and may need SNF.  Will continue PT/OT to see how he progresses.  If pulmonary status improves he may be stable for discharge in 1-2 days.  Will plan for SNF on Friday if no improvement in functional status.  Can be discharged back to independent living at [facility] if improving with HHPT/OT.   LOS: 6 days   [name],W  [name] 09/15/2013, 7:32 AM

## 2013-09-16 LAB — CBC
HCT: 47 % (ref 39.0–52.0)
Hemoglobin: 14.9 g/dL (ref 13.0–17.0)
MCH: 29 pg (ref 26.0–34.0)
MCHC: 31.7 g/dL (ref 30.0–36.0)
MCV: 91.4 fL (ref 78.0–100.0)
Platelets: 159 10*3/uL (ref 150–400)
RBC: 5.14 MIL/uL (ref 4.22–5.81)
RDW: 14.4 % (ref 11.5–15.5)
WBC: 14.5 10*3/uL — ABNORMAL HIGH (ref 4.0–10.5)

## 2013-09-16 LAB — GLUCOSE, CAPILLARY
GLUCOSE-CAPILLARY: 124 mg/dL — AB (ref 70–99)
GLUCOSE-CAPILLARY: 175 mg/dL — AB (ref 70–99)
Glucose-Capillary: 67 mg/dL — ABNORMAL LOW (ref 70–99)
Glucose-Capillary: 120 mg/dL — ABNORMAL HIGH (ref 70–99)
Glucose-Capillary: 230 mg/dL — ABNORMAL HIGH (ref 70–99)

## 2013-09-16 LAB — BASIC METABOLIC PANEL
BUN: 28 mg/dL — ABNORMAL HIGH (ref 6–23)
CO2: 36 mEq/L — ABNORMAL HIGH (ref 19–32)
Calcium: 8.3 mg/dL — ABNORMAL LOW (ref 8.4–10.5)
Chloride: 96 mEq/L (ref 96–112)
Creatinine, Ser: 0.91 mg/dL (ref 0.50–1.35)
GFR calc Af Amer: 88 mL/min — ABNORMAL LOW (ref >90)
GFR calc non Af Amer: 76 mL/min — ABNORMAL LOW (ref >90)
Glucose, Bld: 81 mg/dL (ref 70–99)
Potassium: 4 mEq/L (ref 3.7–5.3)
Sodium: 138 mEq/L (ref 137–147)

## 2013-09-16 LAB — PRO B NATRIURETIC PEPTIDE: PRO B NATRI PEPTIDE: 9007 pg/mL — AB (ref 0–450)

## 2013-09-16 MED ORDER — ACETYLCYSTEINE 20 % IN SOLN
3.0000 mL | Freq: Three times a day (TID) | RESPIRATORY_TRACT | Status: DC
  Administered 2013-09-16 – 2013-09-17 (×2): 3 mL via RESPIRATORY_TRACT
  Filled 2013-09-16 (×7): qty 4

## 2013-09-16 NOTE — Progress Notes (Signed)
Hypoglycemic Event  CBG: 67  Treatment: 15 GM carbohydrate snack  Symptoms: None  Follow-up CBG: Time:0925 CBG Result:120  Possible Reasons for Event: Unknown  Comments/MD notified: will notify on rounding    Sherril Croonhillips, Srishti Strnad Lynne  Remember to initiate Hypoglycemia Order Set & complete

## 2013-09-16 NOTE — Progress Notes (Signed)
Subjective: Still having a generally dry cough, dyspnea with minimal activity, and global weakness. He is considering brief SNF rehabilitation. At home he has nebulizers, but not on oxygen.   Objective: Vital signs in last 24 hours: Temp:  [97.4 F (36.3 C)-97.6 F (36.4 C)] 97.4 F (36.3 C) (01/01 0504) Pulse Rate:  [84-88] 84 (01/01 0504) Resp:  [18-20] 20 (01/01 0504) BP: (118-141)/(65-84) 141/84 mmHg (01/01 0504) SpO2:  [95 %-100 %] 100 % (01/01 0550) Weight:  [74.526 kg (164 lb 4.8 oz)] 74.526 kg (164 lb 4.8 oz) (01/01 0725) Weight change:    Intake/Output from previous day: 12/31 0701 - 01/01 0700 In: 600 [P.O.:600] Out: 1980 [Urine:1980]   General appearance: alert, cooperative, mild distress and with occasional dry cough while lying supine on [place] oxygen Resp: bilateral scattered rhonchi, not using accesory muscles of respiration Cardio: regular rate and rhythm Extremities: extremities normal, atraumatic, no cyanosis or edema  Lab Results:  Recent Labs  09/15/13 0434 09/16/13 0425  WBC 13.6* 14.5*  HGB 14.0 14.9  HCT 44.5 47.0  PLT 145* 159   BMET  Recent Labs  09/15/13 0434 09/16/13 0425  NA 136* 138  K 4.1 4.0  CL 96 96  CO2 34* 36*  GLUCOSE 143* 81  BUN 26* 28*  CREATININE 0.72 0.91  CALCIUM 8.3* 8.3*   CMET CMP     Component Value Date/Time   NA 138 09/16/2013 0425   K 4.0 09/16/2013 0425   CL 96 09/16/2013 0425   CO2 36* 09/16/2013 0425   GLUCOSE 81 09/16/2013 0425   BUN 28* 09/16/2013 0425   CREATININE 0.91 09/16/2013 0425   CALCIUM 8.3* 09/16/2013 0425   PROT 5.7* 09/13/2013 0357   ALBUMIN 3.1* 09/13/2013 0357   AST 22 09/13/2013 0357   ALT 32 09/13/2013 0357   ALKPHOS 67 09/13/2013 0357   BILITOT 0.4 09/13/2013 0357   GFRNONAA 76* 09/16/2013 0425   GFRAA 88* 09/16/2013 0425    CBG (last 3)   Recent Labs  09/15/13 1706 09/15/13 2146 09/16/13 0740  GLUCAP 151* 180* 67*    INR RESULTS:   Lab Results  Component Value Date   INR 2.03*  09/28/2012   INR 2.43* 09/27/2012   INR 2.39* 04/14/2012     Studies/Results: No results found.  Medications: I have reviewed the patient's current medications.  Assessment/Plan: #1 Dyspnea: most likely due to COPD exacerbation, and does not appear ready for discharge back to his independent living facility status. Will adjust meds, and check pulse oxygen sats at rest, just after exertion, then after oxygen is applied. Will also check baseline BNP test today. #2 CAD: stable on current meds.  #3 DM2: stable on current meds.    LOS: 7 days   Camyla Camposano G 09/16/2013, 9:53 AM

## 2013-09-16 NOTE — Progress Notes (Signed)
Pt's O2 sats on room air at rest maintained at 94-97%. Pt unable to ambulate far but after ambulating O2 sats read at 93%. After sitting down O2 quickly returned to 95-97% on room air. Julio SicksK. Talton Delpriore RN

## 2013-09-17 LAB — GLUCOSE, CAPILLARY
Glucose-Capillary: 110 mg/dL — ABNORMAL HIGH (ref 70–99)
Glucose-Capillary: 65 mg/dL — ABNORMAL LOW (ref 70–99)

## 2013-09-17 LAB — CBC
HCT: 44.5 % (ref 39.0–52.0)
Hemoglobin: 14.1 g/dL (ref 13.0–17.0)
MCH: 28.3 pg (ref 26.0–34.0)
MCHC: 31.7 g/dL (ref 30.0–36.0)
MCV: 89.4 fL (ref 78.0–100.0)
Platelets: 161 10*3/uL (ref 150–400)
RBC: 4.98 MIL/uL (ref 4.22–5.81)
RDW: 14.3 % (ref 11.5–15.5)
WBC: 15.7 10*3/uL — ABNORMAL HIGH (ref 4.0–10.5)

## 2013-09-17 LAB — BASIC METABOLIC PANEL
BUN: 29 mg/dL — ABNORMAL HIGH (ref 6–23)
CALCIUM: 8.3 mg/dL — AB (ref 8.4–10.5)
CO2: 28 mEq/L (ref 19–32)
CREATININE: 0.8 mg/dL (ref 0.50–1.35)
Chloride: 94 mEq/L — ABNORMAL LOW (ref 96–112)
GFR calc non Af Amer: 80 mL/min — ABNORMAL LOW (ref >90)
Glucose, Bld: 109 mg/dL — ABNORMAL HIGH (ref 70–99)
Potassium: 4.6 mEq/L (ref 3.7–5.3)
Sodium: 134 mEq/L — ABNORMAL LOW (ref 137–147)

## 2013-09-17 MED ORDER — IPRATROPIUM BROMIDE 0.02 % IN SOLN: 0.5000 mg | Freq: Four times a day (QID) | RESPIRATORY_TRACT | Status: AC

## 2013-09-17 MED ORDER — PREDNISONE 10 MG PO TABS: ORAL_TABLET | ORAL | Status: DC

## 2013-09-17 MED ORDER — INSULIN ASPART 100 UNIT/ML ~~LOC~~ SOLN: 0.0000 [IU] | Freq: Three times a day (TID) | SUBCUTANEOUS | Status: DC

## 2013-09-17 MED ORDER — LEVALBUTEROL HCL 1.25 MG/0.5ML IN NEBU: 1.2500 mg | INHALATION_SOLUTION | Freq: Four times a day (QID) | RESPIRATORY_TRACT | Status: DC

## 2013-09-17 MED ORDER — GLIMEPIRIDE 2 MG PO TABS: 2.0000 mg | ORAL_TABLET | Freq: Every day | ORAL | Status: DC

## 2013-09-17 NOTE — Progress Notes (Signed)
Physical Therapy Treatment Patient Details Name: Darin AntonJoe B [name] MRN: [id number] DOB: August 10, 1928 Today's Date: 09/17/2013 Time: [id number] PT Time Calculation (min): 25 min  PT Assessment / Plan / Recommendation  History of Present Illness pt was admitted with COPD exacerbation.  He has a h/o cad, aaa, dm, htn   PT Comments   Assisted pt OOB to amb in hallway twice with one sitting rest break.  Then assisted back to bed per pt request.  Follow Up Recommendations  SNF [facility]     Does the patient have the potential to tolerate intense rehabilitation     Barriers to Discharge        Equipment Recommendations       Recommendations for Other Services    Frequency Min 3X/week   Progress towards PT Goals Progress towards PT goals: Progressing toward goals  Plan      Precautions / Restrictions Precautions Precautions: Fall Restrictions Weight Bearing Restrictions: No    Pertinent Vitals/Pain No c/o pain    Mobility  Bed Mobility Bed Mobility: Supine to Sit;Sit to Supine Supine to Sit: 4: Min guard Sit to Supine: 4: Min guard Details for Bed Mobility Assistance: increased time Transfers Transfers: Sit to Stand;Stand to Sit Sit to Stand: 4: Min assist;From bed;From chair/3-in-1 Stand to Sit: To chair/3-in-1;4: Min guard;To bed Details for Transfer Assistance: x2. close guard for safety. Pt able to rise from recliner without assist but with some difficulty and increased time.  Ambulation/Gait Ambulation/Gait Assistance: 4: Min assist;4: Min guard Ambulation Distance (Feet): 80 Feet (40 feet x 2 one sitting rest break) Assistive device: Rolling walker Ambulation/Gait Assistance Details: amb twice with one sitting rest break.  Unsteady gait which increases with fatigue.  max c/o weakness esp B LE's. Gait Pattern: Step-through pattern;Decreased stride length;Trunk flexed Gait velocity: decreased    PT Goals (current goals can now be found in the care plan section)     Visit Information  Last PT Received On: 09/17/13 Assistance Needed: +1 History of Present Illness: pt was admitted with COPD exacerbation.  He has a h/o cad, aaa, dm, htn    Subjective Data      Cognition       Balance     End of Session PT - End of Session Equipment Utilized During Treatment: Gait belt Activity Tolerance: Patient limited by fatigue Patient left: in bed;with call bell/phone within reach   Felecia ShellingLori Nekeya Briski  PTA [facility]  Acute  Rehab Pager      [phone]

## 2013-09-17 NOTE — Progress Notes (Signed)
Hypoglycemic Event  CBG: 65  Treatment: 15 GM carbohydrate snack  Symptoms: None  Follow-up CBG: ZOXW:[id number]:0817 CBG Result:110  Possible Reasons for Event: Unknown  Comments/MD notified:protocol    Randa EvensEdwards, Shirlee LatchJulia Coward  Remember to initiate Hypoglycemia Order Set & complete

## 2013-09-17 NOTE — Discharge Summary (Signed)
DISCHARGE SUMMARY  [name]  MR#: [id number]  DOB:1928/09/01  Date of Admission: 09/09/2013 Date of Discharge: 09/17/2013  Attending Physician:[name],[name]  Patient's [name],J [name]  Consults:  None  Discharge Diagnoses: Principal Problem:   COPD with acute exacerbation Active Problems:   Type 2 diabetes mellitus   Permanent atrial fibrillation   Chronic anticoagulation   Chronic combined systolic and diastolic congestive heart failure  Past Medical History  Diagnosis Date  . Pneumonia 12/06/10    healthcare-associated/ left lower lobe  . COPD (chronic obstructive pulmonary disease)     severe stage IV  . Atrial fibrillation     on Coumadin with St. Jude single-chamber pacemaker  . Diabetes mellitus     type 2; s/p Prednisone therapy for pneumonia 12/2010  . Coronary artery disease     s/p anterior STEMI 09/2009; cath. revealed mid LAD 40%, distal LAD 100%- PTCA, prox. RCA 40%, mid. RCA 100% with good collateral filling of PDA; EF 25%; NSTEMI 07/2011 - PCI/DES 100% LCX  . ST elevation (STEMI) myocardial infarction 01//11/12    anterior  . CHF (congestive heart failure)     EF 25% on 09/26/10; EF 50-55% and grade 1 diastolic dysfunction on ECHO 08/2010   . Hypertension   . Hyperlipidemia   . AAA (abdominal aortic aneurysm)     3 cm infrarenal abdominal aortic aneurysm per aorta ultrasound 03/2010  . Osteoarthritis     s/p bilat hip arthroplasty  . Angina   . Ischemic cardiomyopathy     EF 35% LHC 11/12  . GERD (gastroesophageal reflux disease)   . Hypercholesteremia   . PVD (peripheral vascular disease)   . Anxiety    Past Surgical History  Procedure Laterality Date  . Hip arthroplasty      s/p right 09/27/10 and s/p left 09/24/10  . Back surgery    . Knee surgery    . Pacemaker insertion  12/2010    St. Jude single-chamber   . Cardiac catheterization  09/2009, 06/2007    PTCA to distal LAD    Discharge Medications:   Medication List     STOP taking these medications       ipratropium-albuterol 0.5-2.5 (3) MG/3ML Soln  Commonly known as:  DUONEB      TAKE these medications       amiodarone 200 MG tablet  Commonly known as:  PACERONE  Take 1 tablet (200 mg total) by mouth daily.     beclomethasone 80 MCG/ACT inhaler  Commonly known as:  QVAR  Inhale 1 puff into the lungs 2 (two) times daily.     bisoprolol 10 MG tablet  Commonly known as:  ZEBETA  Take 1 tablet (10 mg total) by mouth daily.     cholecalciferol 1000 UNITS tablet  Commonly known as:  VITAMIN D  Take 2,000 Units by mouth daily.     dabigatran 150 MG Caps capsule  Commonly known as:  PRADAXA  Take 150 mg by mouth every 12 (twelve) hours.     digoxin 0.125 MG tablet  Commonly known as:  LANOXIN  Take 0.125 mg by mouth daily.     finasteride 5 MG tablet  Commonly known as:  PROSCAR  Take 5 mg by mouth daily.     furosemide 20 MG tablet  Commonly known as:  LASIX  Take 20 mg by mouth daily. TAKE one daily     glimepiride 2 MG tablet  Commonly known as:  AMARYL  Take  1 tablet (2 mg total) by mouth daily with breakfast.     guaiFENesin 600 MG 12 hr tablet  Commonly known as:  MUCINEX  Take 1,200 mg by mouth 2 (two) times daily.     insulin aspart 100 UNIT/ML injection  Commonly known as:  novoLOG  Inject 0-15 Units into the skin 3 (three) times daily with meals.     ipratropium 0.02 % nebulizer solution  Commonly known as:  ATROVENT  Take 2.5 mLs (0.5 mg total) by nebulization every 6 (six) hours.     isosorbide mononitrate 60 MG 24 hr tablet  Commonly known as:  IMDUR  Take 60 mg by mouth daily.     levalbuterol 1.25 MG/0.5ML nebulizer solution  Commonly known as:  XOPENEX  Take 1.25 mg by nebulization every 6 (six) hours.     losartan 25 MG tablet  Commonly known as:  COZAAR  Take 1 tablet (25 mg total) by mouth daily.     nitroGLYCERIN 0.4 MG SL tablet  Commonly known as:  NITROSTAT  Place 0.4 mg under the tongue every  5 (five) minutes as needed. For chest pain     predniSONE 10 MG tablet  Commonly known as:  DELTASONE  60mg  daily X 3 days, then 40mg  daily X 3 days, then 20mg  daily X 3 days, then 10mg  daily X 3 days, then 5 mg daily thereafter     rosuvastatin 20 MG tablet  Commonly known as:  CRESTOR  Take 20 mg by mouth daily.     sennosides-docusate sodium 8.6-50 MG tablet  Commonly known as:  SENOKOT-S  Take 2 tablets by mouth at bedtime.     tamsulosin 0.4 MG Caps capsule  Commonly known as:  FLOMAX  Take 0.4 mg by mouth daily.        Hospital Procedures: Dg Chest Port 1 View  09/09/2013   CLINICAL DATA:  History of COPD and CHF now with increasing dyspnea  EXAM: PORTABLE CHEST - 1 VIEW  COMPARISON:  Chest x-ray of July 09, 2013.  FINDINGS: The lungs are hyperinflated consistent with COPD. The interstitial markings are increased on the right in the mid and lower lung. The central pulmonary vascularity is mildly prominent but there is no definite cephalization. The cardiopericardial silhouette is enlarged. There is mild tortuosity of the descending thoracic aorta. The permanent pacemaker is unchanged in appearance.  IMPRESSION: 1. Mild stable hyperinflation is consistent with COPD. Increased interstitial markings predominantly on the right in the mid and lower lung suggests subsegmental atelectasis as might be seen with acute bronchitis or developing interstitial pneumonia. There is no alveolar pneumonia. 2. Mild stable enlargement of the cardiac silhouette is demonstrated. The pulmonary vascularity is less prominent than in the past. There may be an element of low-grade chronic CHF present.   Electronically Signed   By: Darin  [name]   On: 09/09/2013 09:24    History of Present Illness (From Dr. Vicente [name] H&P): 80 year old male with history of COPD on chronic steroids, congestive heart failure with last EF approximately 35%, coronary artery disease, AAA with peripheral vascular disease, and  multiple hospitalizations for COPD flares, the last one being at the end of October presents with a 2-3 day history of worsening congestion, shortness of breath, did start a Z-Pak approximately 2 days ago but became acutely dyspneic last night, presented to the emergency room for further evaluation and management with significant respiratory distress, requiring 1 hour of continuous albuterol in the setting of IV steroids,  he was unable to speak in full sentences when he first arrived, now much improved, but still has significant dyspnea on exertion. Denies any overt chest pain, presyncope, orthopnea, and EKG per emergency room physician unchanged compared with with chest x-ray showing interstitial pneumonia improving on nebulizers, patient given Levaquin for his pneumonia in emergency room and admitted to telemetry. Patient denies to desire DO NOT RESUSCITATE CODE STATUS   Hospital Course: Mr. Prosperi was admitted to a telemetry bed. He was placed on high-dose IV steroids, Levaquin, and scheduled nebulizers for COPD exacerbation. Mucomyst nebs were added yesterday.  With these treatments, his pulmonary status has slowly improved but is significantly better since admission. He continues to have minimally productive cough and decreasing wheezing. He will be continued on a very slow prednisone taper down to his 5 mg which she takes daily at baseline.  He should continue frequent scheduled nebulizers for maintenance.  He prefers Qvar over Pulmicort as Inhaled steroids.    His congestive heart failure remained stable on medical therapy. He did have an increase in heart rate in the setting of atrial fibrillation. Albuterol was changed to Xopenex to try to limit any effect on heart rate. This has remained stable on amiodarone, bisoprolol, and digoxin. He is on Pradaxa for anticoagulation.  At this point, the patient's cardiopulmonary status has stabilized. He remains extremely weak and has difficulty ambulating  without assistance. He will require short-term rehabilitation prior to return to his independent living at [facility].  Day of Discharge Exam BP 139/73  Pulse 77  Temp(Src) 97.4 F (36.3 C) (Oral)  Resp 20  Ht 5\' 9"  (1.753 m)  Wt 74.8 kg (164 lb 14.5 oz)  BMI 24.34 kg/m2  SpO2 99%  Physical Exam: General appearance: alert and chronically ill Eyes: no scleral icterus Throat: oropharynx moist without erythema Resp: bilateral expiratory wheezing in the bases, improved Cardio: irregularly irregular rhythm GI: soft, non-tender; bowel sounds normal; no masses,  no organomegaly Extremities: no clubbing, cyanosis or edema  Discharge Labs:  Recent Labs  09/16/13 0425 09/17/13 0436  NA 138 134*  K 4.0 4.6  CL 96 94*  CO2 36* 28  GLUCOSE 81 109*  BUN 28* 29*  CREATININE 0.91 0.80  CALCIUM 8.3* 8.3*   No results found for this basename: AST, ALT, ALKPHOS, BILITOT, PROT, ALBUMIN,  in the last 72 hours  Recent Labs  09/16/13 0425 09/17/13 0436  WBC 14.5* 15.7*  HGB 14.9 14.1  HCT 47.0 44.5  MCV 91.4 89.4  PLT 159 161   Lab Results  Component Value Date   INR 2.03* 09/28/2012   INR 2.43* 09/27/2012   INR 2.39* 04/14/2012   No results found for this basename: CKTOTAL, CKMB, CKMBINDEX, TROPONINI,  in the last 72 hours No results found for this basename: TSH, T4TOTAL, FREET3, T3FREE, THYROIDAB,  in the last 72 hours No results found for this basename: VITAMINB12, FOLATE, FERRITIN, TIBC, IRON, RETICCTPCT,  in the last 72 hours  Discharge instructions:     Discharge Orders   Future Orders Complete By Expires   Diet - low sodium heart healthy  As directed    Discharge instructions  As directed    Comments:     Continue PT/OT.  Monitor O2 sats daily and use O2 if sats below 88%.   Increase activity slowly  As directed       Disposition: to [facility] for Acute Rehab  Follow-up Appts: Follow-up with Dr. [name] at [facility] in 1 week after  discharge from Skilled Nursing Facility  Condition on Discharge: stable, improved  Tests Needing Follow-up: None  Code Status: DNR  Time with discharge activities: 35 minutes Signed: Shontavia Mickel,[name] 09/17/2013, 7:33 AM

## 2013-09-17 NOTE — Progress Notes (Signed)
Patient is set to [facility] today. Patient aware - discharge packet in [place]. RN, Amil AmenJulia aware. [facility] scheduled for 11:30a pickup (Service Request Id: [id number]).   Clinical Social Work Department CLINICAL SOCIAL WORK PLACEMENT NOTE 09/17/2013  Patient:  Jeanella AntonHAZELWOOD,Smitty B  Account Number:  [id number] Admit date:  09/09/2013  Clinical Social Worker:  Orpah GreekKELLY FOLEY, LCSWA  Date/time:  09/15/2013 03:44 PM  Clinical Social Work is seeking post-discharge placement for this patient at the following level of care:   SKILLED NURSING   (*CSW will update this form in Epic as items are completed)   09/15/2013  Patient/family provided with [facility] Department of Clinical Social Work's list of facilities offering this level of care within the geographic area requested by the patient (or if unable, by the patient's family).  09/15/2013  Patient/family informed of their freedom to choose among providers that offer the needed level of care, that participate in Medicare, Medicaid or managed care program needed by the patient, have an available bed and are willing to accept the patient.  09/15/2013  Patient/family informed of [facility]' ownership interest in [facility], as well as of the fact that they are under no obligation to receive care at this facility.  PASARR submitted to EDS on 09/15/2013 PASARR number received from EDS on 09/15/2013  FL2 transmitted to all facilities in geographic area requested by pt/family on  09/15/2013 FL2 transmitted to all facilities within larger geographic area on   Patient informed that his/her managed care company has contracts with or will negotiate with  certain facilities, including the following:     Patient/family informed of bed offers received:  09/15/2013 [facility] Physician recommends and patient chooses bed at    [facility] on  09/17/2013 Patient to be transferred to  facility by PTAR  The following physician request were entered in Epic:   Additional Comments:   Unice BaileyKelly Foley, LCSW [facility] Clinical Social Worker cell #: [phone]

## 2013-09-17 NOTE — Discharge Instructions (Signed)
Chronic Obstructive Pulmonary Disease Exacerbation ° Chronic obstructive pulmonary disease (COPD) is a lung disease. The lungs become damaged, making it hard to get air in and out of your lungs. COPD exacerbation means that your COPD has gotten worse. If you do not get help, this can be a life-threatening problem. °HOME CARE °· Do not smoke. °· Avoid tobacco smoke and other things that bother your lungs. °· If given, take your antibiotic medicine as told. Finish the medicine even if you start to feel better. °· Only take medicines as told by your doctor. °· Drink enough fluids to keep your pee (urine) clear or pale yellow. °· Use a cool mist machine (vaporizer). °· If you use oxygen or a machine that turns liquid medicine into a mist (nebulizer), continue to use them as told. °· Keep up with shots (vaccinations) as told by your doctor. °· Exercise regularly. °· Eat healthy foods. °· Keep all doctor visits as told. °GET HELP RIGHT AWAY IF: °· You are very short of breath. °· You have trouble talking. °· You have bad chest pain. °· You have blood in your spit (sputum). °· You have a fever, or you keep throwing up (vomiting). °· You feel weak, or you pass out (faint). °· You feel confused. °· You keep getting worse. °MAKE SURE YOU:  °· Understand these instructions. °· Will watch your condition. °· Will get help right away if you are not doing well or get worse. °Document Released: 08/22/2011 Document Revised: 11/25/2011 Document Reviewed: 08/22/2011 °ExitCare® Patient Information ©2014 [employer]. ° °

## 2013-09-22 ENCOUNTER — Non-Acute Institutional Stay (SKILLED_NURSING_FACILITY): Payer: Medicare Other | Admitting: Internal Medicine

## 2013-09-22 DIAGNOSIS — I4821 Permanent atrial fibrillation: Secondary | ICD-10-CM

## 2013-09-22 DIAGNOSIS — I509 Heart failure, unspecified: Secondary | ICD-10-CM

## 2013-09-22 DIAGNOSIS — I5042 Chronic combined systolic (congestive) and diastolic (congestive) heart failure: Secondary | ICD-10-CM

## 2013-09-22 DIAGNOSIS — J439 Emphysema, unspecified: Secondary | ICD-10-CM

## 2013-09-22 DIAGNOSIS — E119 Type 2 diabetes mellitus without complications: Secondary | ICD-10-CM

## 2013-09-22 DIAGNOSIS — I4891 Unspecified atrial fibrillation: Secondary | ICD-10-CM

## 2013-09-22 DIAGNOSIS — J438 Other emphysema: Secondary | ICD-10-CM

## 2013-09-25 NOTE — Progress Notes (Signed)
HISTORY & PHYSICAL  DATE: 09/22/2013   FACILITY: [facility]  LEVEL OF CARE: SNF (31)  ALLERGIES:  No Known Allergies  CHIEF COMPLAINT:  Manage COPD, diabetes mellitus and CHF  HISTORY OF PRESENT ILLNESS: Patient is an 80 year old Caucasian me who was hospitalized secondary to respiratory distress. After hospitalization he admitted to this facility for short-term rehabilitation.  COPD: the COPD remains stable.  Pt denies sob, cough, wheezing or declining exercise tolerance.  No complications from the medications presently being used.  CHF:The patient does not relate significant weight changes, denies sob, DOE, orthopnea, PNDs, pedal edema, palpitations or chest pain.  CHF remains stable. No complications form the medications being used.  DM:pt's DM remains stable.  Pt denies polyuria, polydipsia, polyphagia, changes in vision or hypoglycemic episodes.  No complications noted from the medication presently being used.  Last hemoglobin A1c is: Not available.  PAST MEDICAL HISTORY :  Past Medical History  Diagnosis Date  . Pneumonia 12/06/10    healthcare-associated/ left lower lobe  . COPD (chronic obstructive pulmonary disease)     severe stage IV  . Atrial fibrillation     on Coumadin with St. Jude single-chamber pacemaker  . Diabetes mellitus     type 2; s/p Prednisone therapy for pneumonia 12/2010  . Coronary artery disease     s/p anterior STEMI 09/2009; cath. revealed mid LAD 40%, distal LAD 100%- PTCA, prox. RCA 40%, mid. RCA 100% with good collateral filling of PDA; EF 25%; NSTEMI 07/2011 - PCI/DES 100% LCX  . ST elevation (STEMI) myocardial infarction 01//11/12    anterior  . CHF (congestive heart failure)     EF 25% on 09/26/10; EF 50-55% and grade 1 diastolic dysfunction on ECHO 08/2010   . Hypertension   . Hyperlipidemia   . AAA (abdominal aortic aneurysm)     3 cm infrarenal abdominal aortic aneurysm per aorta ultrasound 03/2010  .  Osteoarthritis     s/p bilat hip arthroplasty  . Angina   . Ischemic cardiomyopathy     EF 35% LHC 11/12  . GERD (gastroesophageal reflux disease)   . Hypercholesteremia   . PVD (peripheral vascular disease)   . Anxiety     PAST SURGICAL HISTORY: Past Surgical History  Procedure Laterality Date  . Hip arthroplasty      s/p right 09/27/10 and s/p left 09/24/10  . Back surgery    . Knee surgery    . Pacemaker insertion  12/2010    St. Jude single-chamber   . Cardiac catheterization  09/2009, 06/2007    PTCA to distal LAD    SOCIAL HISTORY:  reports that he quit smoking about 4 years ago. His smoking use included Cigarettes. He has a 100 pack-year smoking history. He has never used smokeless tobacco. He reports that he does not drink alcohol or use illicit drugs.  FAMILY HISTORY:  Family History  Problem Relation Age of Onset  . Heart attack Mother 53  . Heart attack Father 69  . Cancer Other     siblings  . Heart attack Other     CURRENT MEDICATIONS: Reviewed per [facility]  REVIEW OF SYSTEMS:  See HPI otherwise 14 point ROS is negative.  PHYSICAL EXAMINATION  VS:  T 97.5       P 79      RR 18     BP 120/70       POX% 98  WT (Lb) 164  GENERAL: no acute distress, normal body habitus EYES: conjunctivae normal, sclerae normal, normal eye lids MOUTH/THROAT: lips without lesions,no lesions in the mouth,tongue is without lesions,uvula elevates in midline NECK: supple, trachea midline, no neck masses, no thyroid tenderness, no thyromegaly LYMPHATICS: no LAN in the neck, no supraclavicular LAN RESPIRATORY: breathing is even & unlabored, BS CTAB CARDIAC: Heart rate is irregularly irregular, no murmur,no extra heart sounds, no edema GI:  ABDOMEN: abdomen soft, normal BS, no masses, no tenderness  LIVER/SPLEEN: no hepatomegaly, no splenomegaly MUSCULOSKELETAL: HEAD: normal to inspection & palpation BACK: no kyphosis, scoliosis or spinal processes  tenderness EXTREMITIES: LEFT UPPER EXTREMITY: full range of motion, normal strength & tone RIGHT UPPER EXTREMITY:  full range of motion, normal strength & tone LEFT LOWER EXTREMITY:  full range of motion, normal strength & tone RIGHT LOWER EXTREMITY:  full range of motion, normal strength & tone PSYCHIATRIC: the patient is alert & oriented to person, affect & behavior appropriate  LABS/RADIOLOGY:  Labs reviewed: Basic Metabolic Panel:  Recent Labs  09/81/1912/31/14 0434 09/16/13 0425 09/17/13 0436  NA 136* 138 134*  K 4.1 4.0 4.6  CL 96 96 94*  CO2 34* 36* 28  GLUCOSE 143* 81 109*  BUN 26* 28* 29*  CREATININE 0.72 0.91 0.80  CALCIUM 8.3* 8.3* 8.3*   Liver Function Tests:  Recent Labs  07/10/13 0321 09/10/13 0415 09/13/13 0357  AST 20 15 22   ALT 21 16 32  ALKPHOS 75 65 67  BILITOT 0.6 0.7 0.4  PROT 5.8* 5.8* 5.7*  ALBUMIN 3.3* 3.0* 3.1*   CBC:  Recent Labs  09/27/12 0534  09/13/13 0357  09/15/13 0434 09/16/13 0425 09/17/13 0436  WBC 9.7  < > 13.8*  < > 13.6* 14.5* 15.7*  NEUTROABS 8.0*  --  13.3*  --   --   --   --   HGB 12.5*  < > 13.5  < > 14.0 14.9 14.1  HCT 40.4  < > 42.2  < > 44.5 47.0 44.5  MCV 91.0  < > 90.9  < > 91.9 91.4 89.4  PLT 132*  < > 153  < > 145* 159 161  < > = values in this interval not displayed.  Cardiac Enzymes:  Recent Labs  07/09/13 0805  TROPONINI <0.30   CBG:  Recent Labs  09/16/13 2112 09/17/13 0731 09/17/13 0814  GLUCAP 230* 65* 110*   EXAM: PORTABLE CHEST - 1 VIEW   COMPARISON:  Chest x-ray of July 09, 2013.   FINDINGS: The lungs are hyperinflated consistent with COPD. The interstitial markings are increased on the right in the mid and lower lung. The central pulmonary vascularity is mildly prominent but there is no definite cephalization. The cardiopericardial silhouette is enlarged. There is mild tortuosity of the descending thoracic aorta. The permanent pacemaker is unchanged in appearance.    IMPRESSION: 1. Mild stable hyperinflation is consistent with COPD. Increased interstitial markings predominantly on the right in the mid and lower lung suggests subsegmental atelectasis as might be seen with acute bronchitis or developing interstitial pneumonia. There is no alveolar pneumonia. 2. Mild stable enlargement of the cardiac silhouette is demonstrated. The pulmonary vascularity is less prominent than in the past. There may be an element of low-grade chronic CHF present.    ASSESSMENT/PLAN:  COPD-well compensated CHF-well controlled Diabetes mellitus-continue current medications Atrial fibrillation-rate controlled CAD-stable BPH-continue current medications Hypertension-well-controlled Leukocytosis-likely secondary to prednisone. Reassess.  I have reviewed patient's medical records received at  admission/from hospitalization.  CPT CODE: 64332

## 2013-10-01 ENCOUNTER — Telehealth: Payer: Self-pay | Admitting: Internal Medicine

## 2013-10-06 ENCOUNTER — Non-Acute Institutional Stay (SKILLED_NURSING_FACILITY): Payer: Medicare Other | Admitting: Adult Health

## 2013-10-06 DIAGNOSIS — E119 Type 2 diabetes mellitus without complications: Secondary | ICD-10-CM

## 2013-10-06 DIAGNOSIS — I5042 Chronic combined systolic (congestive) and diastolic (congestive) heart failure: Secondary | ICD-10-CM

## 2013-10-06 DIAGNOSIS — J449 Chronic obstructive pulmonary disease, unspecified: Secondary | ICD-10-CM

## 2013-10-06 DIAGNOSIS — I10 Essential (primary) hypertension: Secondary | ICD-10-CM

## 2013-10-06 DIAGNOSIS — I509 Heart failure, unspecified: Secondary | ICD-10-CM

## 2013-10-06 DIAGNOSIS — E785 Hyperlipidemia, unspecified: Secondary | ICD-10-CM

## 2013-10-06 DIAGNOSIS — I4891 Unspecified atrial fibrillation: Secondary | ICD-10-CM

## 2013-10-06 NOTE — Progress Notes (Signed)
Patient ID: [name], male   DOB: 1927-12-11, 80 y.o.   MRN: [id number]              PROGRESS NOTE  DATE: 10/06/2013   FACILITY: [facility]  LEVEL OF CARE: SNF (31)  Acute Visit  CHIEF COMPLAINT:  Discharge Notes  HISTORY OF PRESENT ILLNESS: This is an 80 year old male who is for discharge home to [facility] with Home health PT, OT and Nursing. He has been admitted to [facility] on 09/17/13 from [facility] with COPD with acute exacerbation. Patient was admitted to this facility for short-term rehabilitation after the patient's recent hospitalization.  Patient has completed SNF rehabilitation and therapy has cleared the patient for discharge.  Reassessment of ongoing problem(s):  HTN: Pt 's HTN remains stable.  Denies CP, sob, DOE, pedal edema, headaches, dizziness or visual disturbances.  No complications from the medications currently being used.  Last BP :112/58  ATRIAL FIBRILLATION: the patients atrial fibrillation remains stable.  The patient denies DOE, tachycardia, orthopnea, transient neurological sx, pedal edema, palpitations, & PNDs.  No complications noted from the medications currently being used.  CHF:The patient does not relate significant weight changes, denies sob, DOE, orthopnea, PNDs, pedal edema, palpitations or chest pain.  CHF remains stable.  No complications form the medications being used.  PAST MEDICAL HISTORY : Reviewed.  No changes.  CURRENT MEDICATIONS: Reviewed per [facility]  REVIEW OF SYSTEMS:  GENERAL: no change in appetite, no fatigue, no weight changes, no fever, chills or weakness RESPIRATORY: no cough, SOB, DOE, wheezing, hemoptysis CARDIAC: no chest pain, edema or palpitations GI: no abdominal pain, diarrhea, constipation, heart burn, nausea or vomiting  PHYSICAL EXAMINATION  VS:  T97.3       P60       RR18      BP112/58      POX98 %       WT168.8 (Lb)  GENERAL: no acute distress, normal body habitus EYES:  conjunctivae normal, sclerae normal, normal eye lids NECK: supple, trachea midline, no neck masses, no thyroid tenderness, no thyromegaly LYMPHATICS: no LAN in the neck, no supraclavicular LAN RESPIRATORY: breathing is even & unlabored, BS CTAB CARDIAC: RRR, no murmur,no extra heart sounds, no edema GI: abdomen soft, normal BS, no masses, no tenderness, no hepatomegaly, no splenomegaly PSYCHIATRIC: the patient is alert & oriented to person, affect & behavior appropriate  LABS/RADIOLOGY: Labs reviewed: Basic Metabolic Panel:  Recent Labs  40/98/11 0434 09/16/13 0425 09/17/13 0436  NA 136* 138 134*  K 4.1 4.0 4.6  CL 96 96 94*  CO2 34* 36* 28  GLUCOSE 143* 81 109*  BUN 26* 28* 29*  CREATININE 0.72 0.91 0.80  CALCIUM 8.3* 8.3* 8.3*   Liver Function Tests:  Recent Labs  07/10/13 0321 09/10/13 0415 09/13/13 0357  AST 20 15 22   ALT 21 16 32  ALKPHOS 75 65 67  BILITOT 0.6 0.7 0.4  PROT 5.8* 5.8* 5.7*  ALBUMIN 3.3* 3.0* 3.1*   CBC:  Recent Labs  09/13/13 0357  09/15/13 0434 09/16/13 0425 09/17/13 0436  WBC 13.8*  < > 13.6* 14.5* 15.7*  NEUTROABS 13.3*  --   --   --   --   HGB 13.5  < > 14.0 14.9 14.1  HCT 42.2  < > 44.5 47.0 44.5  MCV 90.9  < > 91.9 91.4 89.4  PLT 153  < > 145* 159 161  < > = values in this interval  not displayed.  Cardiac Enzymes:  Recent Labs  07/09/13 0805  TROPONINI <0.30   CBG:  Recent Labs  09/16/13 2112 09/17/13 0731 09/17/13 0814  GLUCAP 230* 65* 110*     ASSESSMENT/PLAN:  COPD - stable  Diabetes mellitus, type II - well controlled  Atrial fibrillation - rate -controlled  Hypertension - well controlled  Hyperlipidemia - continue Crestor   Chronic combined systolic and diastolic CHF - stable    I have filled out patient's discharge paperwork and written prescriptions.  Patient will receive home health PT, OT and Nursing  Total discharge time: Less than 30 minutes Discharge time involved coordination of the  discharge process with social worker, nursing staff and therapy department. Medical justification for home health services verified.  CPT CODE: [id number]

## 2013-10-08 NOTE — Telephone Encounter (Signed)
Appointment set for 10/11/13 with MR.  Nothing further needed at this time.  Antionette FairyHolly D Pryor

## 2013-10-11 ENCOUNTER — Encounter: Payer: Self-pay | Admitting: Internal Medicine

## 2013-10-11 ENCOUNTER — Ambulatory Visit (INDEPENDENT_AMBULATORY_CARE_PROVIDER_SITE_OTHER): Payer: Medicare Other | Admitting: Internal Medicine

## 2013-10-11 VITALS — BP 114/72 | HR 60 | Temp 97.1°F | Wt 170.2 lb

## 2013-10-11 DIAGNOSIS — J439 Emphysema, unspecified: Secondary | ICD-10-CM

## 2013-10-11 DIAGNOSIS — J438 Other emphysema: Secondary | ICD-10-CM

## 2013-10-11 MED ORDER — ACETYLCYSTEINE 10 % IN SOLN: RESPIRATORY_TRACT | Status: DC

## 2013-10-11 NOTE — Progress Notes (Signed)
Subjective:    Patient ID: [name], male    DOB: Nov 13, 1927, 80 y.o.   MRN: [id number]  HPI  #. 75 pack smoking hx. Quit 2010. Known CAD  #. COPD - Gold stage 3-4  On atrovent, pulmicort nebs, Xopenex and daily prednisone 5 mg - PFTs 11/21/2010  - spirometry only: Fev1 0.84L/31%, Ratio 45. Gold stage 3-4 COPD Spiro 04/26/2011 - fev1 1.06L/34% CAT score 17 - 02/04/2012 Did not desaturate May 2012 185 feet x 3 laps.   3. AECOPD hx:   -March 2012 (LLL PNA),   - April 2012 - clear cxr - Acute Visit 03/05/12 - OPD Rx - June 2013 - a fib and aECopd Admit - July 2013  - for HCAP - January 2013-admitted by Dr. Lutricia [name] for COPD exacerbation. Treated in the telemetry unit. No BiPAP - March 2014: Augmentin and prednisone outpatient treatment. Start N. Acetylcysteine  #IMaging hx  - 2005 CT scan chest no evidence of lung cancer - 2014 January chest x-ray no evidence of lung cancer - 2014 March: discussed - he is not interested in screening, "I do not want to know about it"    OV 12/09/2012  COPD followup. He continues to live in [place] [facility]. He does daily exercises. In January 2014 he was admitted for COPD exacerbation. He was doing well but a few days ago developed sinusitis. His  primary care physician Dr. Lutricia [name]  started him on Augmentin on 12/04/2012. He still has a few more days of Augmentin left. He is feeling better but he feels that rate of resolution is low. He feels there is a lot of chest congestion. He had tried increasing his daily prednisone from 5 mg to 10 mg but this has not helped. COPD cat score is 25 and significantly worse than baseline; all documented below  #COPD  You have mild attack of copd called COPD exacerbation  Avoid exercises in the middle of a COPD exacerbation  Please take prednisone 82m once daily x 3 days, then 337mdaily x 3 days, 2038mnce daily x 3 days, then 62m59mce daily x 3 days, then 5mg 34me daily to continue  Start N.  acetylcysteine 600 mg twice a day  - [name] published in [place] shows it helps   - this is not to make you feel better but to to prevent recurrent bronchitis episodes  - You are more likely to get this drug at [facility] vitamin store then at [employer]  - You can also get this at website www.[employer]WholesaleCream.sit functions as an anti-oxidant and there are minimal/none side effects  #Lung cancer screening - We discussed this but I respect your desire to postpone it  #Followup 4-5 months with COPD cat score at followup  OV 04/08/2013  FU COPD -- severe/very severe  Since lst OV has improved. [facility] Occ uses cane. COPD is stable. CAT score is 17 (see below) and reflects baseline activity. He is looking better an dfeeling better. Continues on duopneb q6h + qvar bid + xopnex prn + pred 5mg p85mday. Has not taken NAC. REltuctant for mdi changes due to vA system logistics.  Only issue is recent cold and saw PMD who bumped his pred up and now back to [place]. He does not want to titrate prednisone down further; wants to remain at 5mg [id number]   OV 10/11/2013  Chief Complaint  Patient presents with  . Follow-up    Pt just got out of the hospital friday. He had PNA. Pt c/o SOB, prod cough w/ white phlem, wheezing, chest tx.    FU COPD - Hospitalized around Christmas 2014 2 09/17/2013 under his primary care service. Apparently diagnosis was pneumonia not otherwise specified, review of imaging shows some right lower lobe infiltrate. I do not see any positive blood culture sputum culture or flu panel. He was then discharged to [facility] rehabilitation. Then on 10/08/2013 he was discharged to his assisted living at [facility]. Physical therapy there is pending. He is improved from his recent hospitalization but he does feel deconditioned compared to baseline and more dyspneic compared to baseline. COPD cat score has declined  back to 24. Is compliant with his nebulizers. He says that while in the hospital he was given some nebulizers Mucomyst that really helped bring out his sputum and he wants to give this a try. I've warned him about the paradoxical bronchospasm so he will try to judicious amounts    CAT COPD Symptom & Quality of Life Score ([place]) 0 is no burden. 5 is highest burden 05/07/2012  07/24/2012  12/09/2012 aecopd office Rx 04/08/2013 stable 10/11/13 hspitalized txmas-newy year 2  Never Cough -> Cough all the time 2 2 2 2 3   No phlegm in chest -> Chest is full of phlegm 2 3 2 2 1   No chest tightness -> Chest feels very tight 1 0 3 1 1   No dyspnea for 1 flight stairs/hill -> Very dyspneic for 1 flight of stairs 5 5 5 4 5   No limitations for ADL at home -> Very limited with ADL at home 5 1 4 2 5   Confident leaving home -> Not at all confident leaving home 4 0 4 2 5   Sleep soundly -> Do not sleep soundly because of lung condition 1 0 1 1 1   Lots of Energy -> No energy at all 4 3 1 3 3   TOTAL Score (max 40)  24 14 25 17 24    Past, Family, Social reviewed: no change since last visit  Review of Systems  Constitutional: Negative for fever and unexpected weight change.  HENT: Positive for congestion. Negative for dental problem, ear pain, nosebleeds, postnasal drip, rhinorrhea, sinus pressure, sneezing, sore throat and trouble swallowing.   Eyes: Negative for redness and itching.  Respiratory: Positive for cough, chest tightness, shortness of breath and wheezing.   Cardiovascular: Negative for palpitations and leg swelling.  Gastrointestinal: Negative for nausea and vomiting.  Genitourinary: Negative for dysuria.  Musculoskeletal: Negative for joint swelling.  Skin: Negative for rash.  Neurological: Negative for headaches.  Hematological: Does not bruise/bleed easily.  Psychiatric/Behavioral: Negative for dysphoric mood. The patient is not nervous/anxious.        Objective:   Physical  Exam        Assessment & Plan:

## 2013-10-11 NOTE — Assessment & Plan Note (Signed)
Glad you are better but understand you still have some way to go Continue PT at [facility], O2 and other nebs To help mucus clearance  - You can try mucomyst nebulizer Acetylcysteine 10% solution, 6 mL until nebulized. Do not try more than 4 times each month because it can make wheezing worse while trying to get rid of your sputum  FOllowup  3 months or sooner CAT score at followup  

## 2013-10-11 NOTE — Patient Instructions (Signed)
Glad you are better but understand you still have some way to go Continue PT at [facility], O2 and other nebs To help mucus clearance  - You can try mucomyst nebulizer Acetylcysteine 10% solution, 6 mL until nebulized. Do not try more than 4 times each month because it can make wheezing worse while trying to get rid of your sputum  FOllowup  3 months or sooner CAT score at followup

## 2013-10-12 ENCOUNTER — Telehealth: Payer: Self-pay | Admitting: Internal Medicine

## 2013-10-12 NOTE — Telephone Encounter (Signed)
The 20% has to be diluted in a 1:1 ratio with sterile wtaer or normal saline. Because of his tremors I did not write for it. Can the pharmacy dilute it for patient and give to him? Or he should be able to do it  Thanks  Dr. Kalman ShanMurali Shamus [name], M.D., [facility].P Pulmonary and Critical Care Medicine Staff Physician [facility] Pager: [phone], If no answer or between  15:00h - 7:00h: call [phone]  10/12/2013 3:53 PM

## 2013-10-12 NOTE — Telephone Encounter (Signed)
Spoke with pt pharmacy and they cannot find mucomyst 10% but that can order mucomyst 20% is this fine? Please advise.Carron CurieJennifer Kemara Quigley, CMA

## 2013-10-13 NOTE — Telephone Encounter (Signed)
I spoke with pharmacy and pt son in law was at the pharmacy. They cannot dilute this for the pt, but they will speak to the family and see if it is something that they are comfortable with doing and will let us know. Will await call back. Carron CurieJennifer Dejanira Pamintuan, CMA

## 2013-10-14 ENCOUNTER — Telehealth: Payer: Self-pay | Admitting: Internal Medicine

## 2013-10-14 NOTE — Telephone Encounter (Signed)
I called and spoke with Olegario MessierKathy the pharm. Made her aware of recs. Nothing further needed

## 2013-10-14 NOTE — Telephone Encounter (Signed)
I spoke with p harmacy and they have ordered the saline to dilute the 20% and will give this to the pt today. Carron CurieJennifer Castillo, CMA

## 2013-10-14 NOTE — Telephone Encounter (Signed)
Ok let him try 3mL undiluted same frequency (very limited) as during my OV

## 2013-10-14 NOTE — Telephone Encounter (Signed)
Per OV 10/11/13: To help mucus clearance             - You can try mucomyst nebulizer Acetylcysteine 10% solution, 6 mL until nebulized. Do not try more than 4 times each month because it can make wheezing worse while trying to get rid of your sputum ---  I called and spoke with Olegario MessierKathy [facility]. They received RX in and mixed the medication up-the medication is only good for 96 hrs.  It comes in like an injection vial. This is too complicated for pt to mix for himself. But if MR is okay with this then they will council pt on how to mix this himself but if pt has tremors this will be more difficult for him.  She reports we can try the acetylcysteine 20 % and he nebulize 3 ML and it does not have to be diluted. She reports this would equal the same to the diluted 6 ML of the acetylcysteine. Please advise MR thanks

## 2013-10-28 ENCOUNTER — Telehealth: Payer: Self-pay | Admitting: Internal Medicine

## 2013-10-28 DIAGNOSIS — J439 Emphysema, unspecified: Secondary | ICD-10-CM

## 2013-10-28 NOTE — Telephone Encounter (Signed)
Called spoke with Joyce GrossKay. She reports to fax order for new neb machine to [phone]. Order placed. Nothing further needed

## 2013-11-04 ENCOUNTER — Telehealth: Payer: Self-pay | Admitting: Internal Medicine

## 2013-11-04 NOTE — Telephone Encounter (Signed)
lmomtcb x1 for pt. We had already faxed order for this see previous phone note

## 2013-11-05 NOTE — Telephone Encounter (Signed)
Pt's daughter is aware that we have sent over order for neb machine.

## 2013-11-08 ENCOUNTER — Encounter: Payer: Self-pay | Admitting: Internal Medicine

## 2013-11-08 ENCOUNTER — Ambulatory Visit (INDEPENDENT_AMBULATORY_CARE_PROVIDER_SITE_OTHER): Payer: Medicare Other | Admitting: Internal Medicine

## 2013-11-08 ENCOUNTER — Ambulatory Visit: Payer: Medicare Other | Admitting: Internal Medicine

## 2013-11-08 ENCOUNTER — Ambulatory Visit (INDEPENDENT_AMBULATORY_CARE_PROVIDER_SITE_OTHER)
Admission: RE | Admit: 2013-11-08 | Discharge: 2013-11-08 | Disposition: A | Discharge status: Home / Self Care | Payer: Medicare Other | Source: Ambulatory Visit | Attending: Internal Medicine | Admitting: Internal Medicine

## 2013-11-08 VITALS — BP 108/70 | HR 93 | Ht 69.0 in | Wt 174.0 lb

## 2013-11-08 DIAGNOSIS — J441 Chronic obstructive pulmonary disease with (acute) exacerbation: Secondary | ICD-10-CM

## 2013-11-08 DIAGNOSIS — R059 Cough, unspecified: Secondary | ICD-10-CM

## 2013-11-08 DIAGNOSIS — R05 Cough: Secondary | ICD-10-CM

## 2013-11-08 MED ORDER — DOXYCYCLINE HYCLATE 100 MG PO TABS: 100.0000 mg | ORAL_TABLET | Freq: Two times a day (BID) | ORAL | Status: DC

## 2013-11-08 MED ORDER — PREDNISONE 10 MG PO TABS: ORAL_TABLET | ORAL | Status: DC

## 2013-11-08 NOTE — Patient Instructions (Signed)
YOu are having another COPD Exacerbation Continue PT at [facility], O2 and other nebs GEt script for new neb machine Take doxycycline 100mg  po twice daily x 5 days; take after meals and avoid sunlight Take prednisone 40 mg daily x 2 days, then 20mg  daily x 2 days, then 10mg  daily x 2 days, then 5mg  daily x 2 days and stop CXR 2 view today To help mucus clearance  - You can try mucomyst nebulizer Acetylcysteine210% solution, 3 mL until nebulized. Do not try more than 4 times each month because it can make wheezing worse while trying to get rid of your sputum  FOllowup  3 months or sooner CAT score at followup

## 2013-11-08 NOTE — Progress Notes (Signed)
Subjective:    Patient ID: [name], male    DOB: 23-Jan-1928, 80 y.o.   MRN: [id number]  HPI   #. 75 pack smoking hx. Quit 2010. Known CAD  #. COPD - Gold stage 3-4  On atrovent, pulmicort nebs, Xopenex and daily prednisone 5 mg - PFTs 11/21/2010  - spirometry only: Fev1 0.84L/31%, Ratio 45. Gold stage 3-4 COPD Spiro 04/26/2011 - fev1 1.06L/34% CAT score 17 - 02/04/2012 Did not desaturate May 2012 185 feet x 3 laps.   3. AECOPD hx:   -March 2012 (LLL PNA),   - April 2012 - clear cxr - Acute Visit 03/05/12 - OPD Rx - June 2013 - a fib and aECopd Admit - July 2013  - for HCAP - January 2013-admitted by Dr. Lutricia Feil for COPD exacerbation. Treated in the telemetry unit. No BiPAP - March 2014: Augmentin and prednisone outpatient treatment. Start N. Acetylcysteine  #IMaging hx  - 2005 CT scan chest no evidence of lung cancer - 2014 January chest x-ray no evidence of lung cancer - 2014 March: discussed - he is not interested in screening, "I do not want to know about it"    OV 12/09/2012  COPD followup. He continues to live in [name]. He does daily exercises. In January 2014 he was admitted for COPD exacerbation. He was doing well but a few days ago developed sinusitis. His  primary care physician Dr. Lutricia Feil  started him on Augmentin on 12/04/2012. He still has a few more days of Augmentin left. He is feeling better but he feels that rate of resolution is low. He feels there is a lot of chest congestion. He had tried increasing his daily prednisone from 5 mg to 10 mg but this has not helped. COPD cat score is 25 and significantly worse than baseline; all documented below  #COPD  You have mild attack of copd called COPD exacerbation  Avoid exercises in the middle of a COPD exacerbation  Please take prednisone 13m once daily x 3 days, then 351mdaily x 3 days, 2036mnce daily x 3 days, then 72m72mce daily x 3 days, then 5mg 74me daily to continue  Start N.  acetylcysteine 600 mg twice a day  - [name] published in [place]014 shows it helps   - this is not to make you feel better but to to prevent recurrent bronchitis episodes  - You are more likely to get this drug at [facility] vitamin store then at [employer]  - You can also get this at website www.vWholesaleCream.sit functions as an anti-oxidant and there are minimal/none side effects  #Lung cancer screening - We discussed this but I respect your desire to postpone it  #Followup 4-5 months with COPD cat score at followup  OV 04/08/2013  FU COPD -- severe/very severe  Since lst OV has improved. [name] Occ uses cane. COPD is stable. CAT score is 17 (see below) and reflects baseline activity. He is looking better an dfeeling better. Continues on duopneb q6h + qvar bid + xopnex prn + pred 5mg p31mday. Has not taken NAC. REltuctant for mdi changes due to vA system logistics.  Only issue is recent cold and saw PMD who bumped his pred up and now back to [place]. He does not want to titrate prednisone down further; wants to remain at 5mg [id number]   OV 10/11/2013  Chief Complaint  Patient presents with  . Follow-up    Pt just got out of the hospital friday. He had PNA. Pt c/o SOB, prod cough w/ white phlem, wheezing, chest tx.    FU COPD - Hospitalized around Christmas 2014 2 09/17/2013 under his primary care service. Apparently diagnosis was pneumonia not otherwise specified, review of imaging shows some right lower lobe infiltrate. I do not see any positive blood culture sputum culture or flu panel. He was then discharged to [facility] rehabilitation. Then on 10/08/2013 he was discharged to his assisted living at [facility]. Physical therapy there is pending. He is improved from his recent hospitalization but he does feel deconditioned compared to baseline and more dyspneic compared to baseline. COPD cat score has declined  back to 24. Is compliant with his nebulizers. He says that while in the hospital he was given some nebulizers Mucomyst that really helped bring out his sputum and he wants to give this a try. I've warned him about the paradoxical bronchospasm so he will try to judicious amounts  REC Glad you are better but understand you still have some way to go Continue PT at [facility], O2 and other nebs To help mucus clearance  - You can try mucomyst nebulizer Acetylcysteine 10% solution, 6 mL until nebulized. Do not try more than 4 times each month because it can make wheezing worse while trying to get rid of your sputum  FOllowup  3 months or sooner CAT score at followup  OV 11/08/2013  Chief Complaint  Patient presents with  . Acute Visit    Pt states Thursday he has a stomach virus and this made him weak. He  states since then he has been having increased SOB, chest congestion, productive cough wiht yellow phlegm.    Acute visit. Was feeling baseline. But on 11/04/2013 had some nausea with vomiting and later in the day diarrhea. But both resolved by end of the day 11/04/2013. Then on 11/05/2013 started noticing dyspnea on exertion that was worse than baseline. This is moderate in severity. Relieved by rest. Has been stable to slightly worse over the next few days. However today started having increasing cough compared to baseline and slight change in color of sputum to thick yellow. And also increased sputum volume.  He is frustrated that he does not have a new neb machine despite [place] putting in order. Also his Mucomyst nebulizers have not come through despite [place] talking to pharmacy and arranging 20% solution.   Dg Chest 2 View  11/08/2013   CLINICAL DATA:  Shortness of breath, cough, COPD  EXAM: CHEST  2 VIEW  COMPARISON:  09/09/2013  FINDINGS: Cardiomediastinal silhouette is stable. Hyperinflation again noted. Single lead cardiac pacemaker is unchanged in position. Stable chronic mild  interstitial prominence. Atherosclerotic calcifications of thoracic aorta.  IMPRESSION: COPD.  No active disease.   Electronically Signed   By: Lahoma Crocker M.D.   On: 11/08/2013 18:22     CAT COPD Symptom & Quality of Life Score ([place]) 0 is no burden. 5 is highest burden 05/07/2012  07/24/2012  12/09/2012 aecopd office Rx 04/08/2013 stable 10/11/13 hspitalized txmas-newy year 2  Never Cough -> Cough all the time $Remove'2 2 2 2 3  'lLUaeNj$ No phlegm in chest -> Chest is full of phlegm $RemoveB'2 3 2 2 1  'qmJPbwsr$ No chest tightness -> Chest feels very tight 1 0 $R'3 1 1  'ii$ No dyspnea for 1 flight stairs/hill -> Very dyspneic for 1 flight  of stairs $RemoveB'5 5 5 4 5  'HsFwYCeV$ No limitations for ADL at home -> Very limited with ADL at home $Remov'5 1 4 2 5  'RquXSd$ Confident leaving home -> Not at all confident leaving home 4 0 $R'4 2 5  'cl$ Sleep soundly -> Do not sleep soundly because of lung condition 1 0 $R'1 1 1  'fG$ Lots of Energy -> No energy at all $Remo'4 3 1 3 3  'RCAnn$ TOTAL Score (max 40)  $Re'24 14 25 17 24     'oiF$ Review of Systems  Constitutional: Negative for fever and unexpected weight change.  HENT: Negative for congestion, dental problem, ear pain, nosebleeds, postnasal drip, rhinorrhea, sinus pressure, sneezing, sore throat and trouble swallowing.   Eyes: Negative for redness and itching.  Respiratory: Positive for cough, chest tightness, shortness of breath and wheezing.   Cardiovascular: Negative for palpitations and leg swelling.  Gastrointestinal: Negative for nausea and vomiting.  Genitourinary: Negative for dysuria.  Musculoskeletal: Negative for joint swelling.  Skin: Negative for rash.  Neurological: Negative for headaches.  Hematological: Does not bruise/bleed easily.  Psychiatric/Behavioral: Negative for dysphoric mood. The patient is not nervous/anxious.        Objective:   Physical Exam  Nursing note and vitals reviewed. Constitutional: He is oriented to person, place, and time. He appears well-developed and well-nourished. No distress.  Sitting  in wheel chair  HENT:  Head: Normocephalic and atraumatic.  Right Ear: External ear normal.  Left Ear: External ear normal.  Mouth/Throat: Oropharynx is clear and moist. No oropharyngeal exudate.  Eyes: Conjunctivae and EOM are normal. Pupils are equal, round, and reactive to light. Right eye exhibits no discharge. Left eye exhibits no discharge. No scleral icterus.  Neck: Normal range of motion. Neck supple. No JVD present. No tracheal deviation present. No thyromegaly present.  Cardiovascular: Normal rate, regular rhythm and intact distal pulses.  Exam reveals no gallop and no friction rub.   No murmur heard. Pulmonary/Chest: Effort normal and breath sounds normal. No respiratory distress. He has no wheezes. He has no rales. He exhibits no tenderness.  Purse lip breathing + Scattered faint wheeze  Abdominal: Soft. Bowel sounds are normal. He exhibits no distension and no mass. There is no tenderness. There is no rebound and no guarding.  Musculoskeletal: Normal range of motion. He exhibits no edema and no tenderness.  Lymphadenopathy:    He has no cervical adenopathy.  Neurological: He is alert and oriented to person, place, and time. He has normal reflexes. No cranial nerve deficit. Coordination normal.  Tremors +  Skin: Skin is warm and dry. No rash noted. He is not diaphoretic. No erythema. No pallor.  Psychiatric: He has a normal mood and affect. His behavior is normal. Judgment and thought content normal.          Assessment & Plan:

## 2013-11-09 NOTE — Assessment & Plan Note (Addendum)
YOu are having another COPD Exacerbation. CCXr is clear  PLAN Continue PT at [facility], O2 and other nebs GEt script for new neb machine Take doxycycline 100mg  po twice daily x 5 days; take after meals and avoid sunlight Take prednisone 40 mg daily x 2 days, then 20mg  daily x 2 days, then 10mg  daily x 2 days, then 5mg  daily x 2 days and stop  To help mucus clearance  - You can try mucomyst nebulizer Acetylcysteine210% solution, 3 mL until nebulized. Do not try more than 4 times each month because it can make wheezing worse while trying to get rid of your sputum  FOllowup  3 months or sooner CAT score at followup

## 2013-11-10 ENCOUNTER — Telehealth: Payer: Self-pay | Admitting: Internal Medicine

## 2013-11-10 NOTE — Telephone Encounter (Signed)
lmomtcb x1 for demeka

## 2013-11-12 NOTE — Telephone Encounter (Signed)
lmomtcb x2 for demeka

## 2013-11-12 NOTE — Progress Notes (Signed)
Quick Note:  Spoke with pt and notified of results per Dr.Ramaswamy. Pt verbalized understanding and denied any questions.  ______ 

## 2013-11-15 NOTE — Telephone Encounter (Signed)
LMTCBx3. Jennifer Castillo, CMA  

## 2013-11-16 NOTE — Telephone Encounter (Signed)
Per protocol I will sign off on message. Jennifer Castillo, CMA  

## 2013-11-17 ENCOUNTER — Encounter: Payer: Self-pay | Admitting: Cardiology

## 2013-11-17 ENCOUNTER — Telehealth: Payer: Self-pay | Admitting: Internal Medicine

## 2013-11-17 NOTE — Telephone Encounter (Signed)
Spoke with Demeka. p[facility].   Pt states that he is using duoneb and separate Ipatropium in nebulizer at the same time . She is questioning this dose.  She is to see pt again tomorrow.  I advised her to have pt show her all the different neb meds he has and call us to verify strengths and frequencies.  And we will clarify with Dr Marchelle Gearingamaswamy what he needs to be on.  Will await call back.

## 2013-11-18 NOTE — Telephone Encounter (Signed)
Demeka called to let us know that the pt is only using Duoneb. She also needing a verbal order to continue seeing the pt. This has been given to her.

## 2013-12-02 ENCOUNTER — Ambulatory Visit (INDEPENDENT_AMBULATORY_CARE_PROVIDER_SITE_OTHER): Payer: Medicare Other | Admitting: *Deleted

## 2013-12-02 DIAGNOSIS — I4891 Unspecified atrial fibrillation: Secondary | ICD-10-CM

## 2013-12-02 LAB — MDC_IDC_ENUM_SESS_TYPE_INCLINIC
Battery Remaining Longevity: 114 mo
Battery Voltage: 2.98 V
Brady Statistic RV Percent Paced: 69 %
Date Time Interrogation Session: 20150319143408
Implantable Pulse Generator Model: 1210
Lead Channel Pacing Threshold Amplitude: 0.75 V
Lead Channel Setting Pacing Pulse Width: 0.4 ms
Lead Channel Setting Sensing Sensitivity: 2 mV
MDC IDC MSMT LEADCHNL RV IMPEDANCE VALUE: 437.5 Ohm
MDC IDC MSMT LEADCHNL RV PACING THRESHOLD PULSEWIDTH: 0.4 ms
MDC IDC MSMT LEADCHNL RV SENSING INTR AMPL: 12 mV
MDC IDC PG SERIAL: 7207053
MDC IDC SET LEADCHNL RV PACING AMPLITUDE: 2.5 V

## 2013-12-02 NOTE — Progress Notes (Signed)
Pacemaker check in clinic. Normal device function. Thresholds, sensing, impedances consistent with previous measurements. Device programmed to maximize longevity. No high ventricular rates noted. Device programmed at appropriate safety margins. Histogram distribution appropriate for patient activity level. Device programmed to optimize intrinsic conduction. Estimated longevity 9.5 years. Patient education completed.  ROV 6 months with Dr. Ladona Ridgelaylor.

## 2013-12-14 ENCOUNTER — Encounter: Payer: Self-pay | Admitting: Internal Medicine

## 2013-12-31 ENCOUNTER — Inpatient Hospital Stay (HOSPITAL_COMMUNITY)
Admission: EM | Admit: 2013-12-31 | Discharge: 2014-01-06 | DRG: 190 | Disposition: A | Discharge status: Home Health | Payer: Medicare Other | Attending: Pulmonary Disease | Admitting: Pulmonary Disease

## 2013-12-31 ENCOUNTER — Encounter (HOSPITAL_COMMUNITY): Payer: Self-pay | Admitting: Emergency Medicine

## 2013-12-31 ENCOUNTER — Emergency Department (HOSPITAL_COMMUNITY): Payer: Medicare Other

## 2013-12-31 DIAGNOSIS — J449 Chronic obstructive pulmonary disease, unspecified: Secondary | ICD-10-CM

## 2013-12-31 DIAGNOSIS — I4891 Unspecified atrial fibrillation: Secondary | ICD-10-CM | POA: Diagnosis present

## 2013-12-31 DIAGNOSIS — J189 Pneumonia, unspecified organism: Secondary | ICD-10-CM

## 2013-12-31 DIAGNOSIS — E785 Hyperlipidemia, unspecified: Secondary | ICD-10-CM | POA: Diagnosis present

## 2013-12-31 DIAGNOSIS — I252 Old myocardial infarction: Secondary | ICD-10-CM

## 2013-12-31 DIAGNOSIS — M1990 Unspecified osteoarthritis, unspecified site: Secondary | ICD-10-CM | POA: Diagnosis present

## 2013-12-31 DIAGNOSIS — N179 Acute kidney failure, unspecified: Secondary | ICD-10-CM | POA: Diagnosis present

## 2013-12-31 DIAGNOSIS — Z8249 Family history of ischemic heart disease and other diseases of the circulatory system: Secondary | ICD-10-CM

## 2013-12-31 DIAGNOSIS — J441 Chronic obstructive pulmonary disease with (acute) exacerbation: Principal | ICD-10-CM | POA: Diagnosis present

## 2013-12-31 DIAGNOSIS — J9620 Acute and chronic respiratory failure, unspecified whether with hypoxia or hypercapnia: Secondary | ICD-10-CM | POA: Diagnosis present

## 2013-12-31 DIAGNOSIS — K219 Gastro-esophageal reflux disease without esophagitis: Secondary | ICD-10-CM | POA: Diagnosis present

## 2013-12-31 DIAGNOSIS — E119 Type 2 diabetes mellitus without complications: Secondary | ICD-10-CM | POA: Diagnosis present

## 2013-12-31 DIAGNOSIS — Z66 Do not resuscitate: Secondary | ICD-10-CM | POA: Diagnosis present

## 2013-12-31 DIAGNOSIS — I2510 Atherosclerotic heart disease of native coronary artery without angina pectoris: Secondary | ICD-10-CM | POA: Diagnosis present

## 2013-12-31 DIAGNOSIS — I5043 Acute on chronic combined systolic (congestive) and diastolic (congestive) heart failure: Secondary | ICD-10-CM | POA: Diagnosis present

## 2013-12-31 DIAGNOSIS — Z950 Presence of cardiac pacemaker: Secondary | ICD-10-CM

## 2013-12-31 DIAGNOSIS — Z87891 Personal history of nicotine dependence: Secondary | ICD-10-CM

## 2013-12-31 DIAGNOSIS — I739 Peripheral vascular disease, unspecified: Secondary | ICD-10-CM | POA: Diagnosis present

## 2013-12-31 DIAGNOSIS — G9340 Encephalopathy, unspecified: Secondary | ICD-10-CM | POA: Diagnosis present

## 2013-12-31 DIAGNOSIS — Z96649 Presence of unspecified artificial hip joint: Secondary | ICD-10-CM

## 2013-12-31 DIAGNOSIS — I509 Heart failure, unspecified: Secondary | ICD-10-CM

## 2013-12-31 DIAGNOSIS — Z7901 Long term (current) use of anticoagulants: Secondary | ICD-10-CM

## 2013-12-31 DIAGNOSIS — I10 Essential (primary) hypertension: Secondary | ICD-10-CM | POA: Diagnosis present

## 2013-12-31 DIAGNOSIS — I2589 Other forms of chronic ischemic heart disease: Secondary | ICD-10-CM | POA: Diagnosis present

## 2013-12-31 DIAGNOSIS — Z9861 Coronary angioplasty status: Secondary | ICD-10-CM

## 2013-12-31 DIAGNOSIS — R079 Chest pain, unspecified: Secondary | ICD-10-CM

## 2013-12-31 DIAGNOSIS — N400 Enlarged prostate without lower urinary tract symptoms: Secondary | ICD-10-CM | POA: Diagnosis present

## 2013-12-31 DIAGNOSIS — I4821 Permanent atrial fibrillation: Secondary | ICD-10-CM

## 2013-12-31 DIAGNOSIS — I5042 Chronic combined systolic (congestive) and diastolic (congestive) heart failure: Secondary | ICD-10-CM

## 2013-12-31 DIAGNOSIS — T380X5A Adverse effect of glucocorticoids and synthetic analogues, initial encounter: Secondary | ICD-10-CM | POA: Diagnosis present

## 2013-12-31 DIAGNOSIS — IMO0002 Reserved for concepts with insufficient information to code with codable children: Secondary | ICD-10-CM

## 2013-12-31 LAB — TSH: TSH: 0.601 u[IU]/mL (ref 0.350–4.500)

## 2013-12-31 LAB — COMPREHENSIVE METABOLIC PANEL
ALK PHOS: 75 U/L (ref 39–117)
ALT: 18 U/L (ref 0–53)
AST: 18 U/L (ref 0–37)
Albumin: 3.7 g/dL (ref 3.5–5.2)
BILIRUBIN TOTAL: 0.8 mg/dL (ref 0.3–1.2)
BUN: 13 mg/dL (ref 6–23)
CHLORIDE: 104 meq/L (ref 96–112)
CO2: 23 meq/L (ref 19–32)
Calcium: 9.2 mg/dL (ref 8.4–10.5)
Creatinine, Ser: 0.88 mg/dL (ref 0.50–1.35)
GFR calc Af Amer: 90 mL/min — ABNORMAL LOW (ref >90)
GFR, EST NON AFRICAN AMERICAN: 77 mL/min — AB (ref >90)
Glucose, Bld: 150 mg/dL — ABNORMAL HIGH (ref 70–99)
Potassium: 4.5 mEq/L (ref 3.7–5.3)
SODIUM: 142 meq/L (ref 137–147)
Total Protein: 6.5 g/dL (ref 6.0–8.3)

## 2013-12-31 LAB — I-STAT ARTERIAL BLOOD GAS, ED
Acid-base deficit: 3 mmol/L — ABNORMAL HIGH (ref 0.0–2.0)
BICARBONATE: 23.7 meq/L (ref 20.0–24.0)
O2 SAT: 95 %
PCO2 ART: 47.9 mmHg — AB (ref 35.0–45.0)
PH ART: 7.303 — AB (ref 7.350–7.450)
Patient temperature: 98.6
TCO2: 25 mmol/L (ref 0–100)
pO2, Arterial: 82 mmHg (ref 80.0–100.0)

## 2013-12-31 LAB — MAGNESIUM: Magnesium: 1.7 mg/dL (ref 1.5–2.5)

## 2013-12-31 LAB — CBC
HCT: 44.6 % (ref 39.0–52.0)
HEMOGLOBIN: 14 g/dL (ref 13.0–17.0)
MCH: 30.4 pg (ref 26.0–34.0)
MCHC: 31.4 g/dL (ref 30.0–36.0)
MCV: 97 fL (ref 78.0–100.0)
Platelets: 152 10*3/uL (ref 150–400)
RBC: 4.6 MIL/uL (ref 4.22–5.81)
RDW: 14.7 % (ref 11.5–15.5)
WBC: 11 10*3/uL — AB (ref 4.0–10.5)

## 2013-12-31 LAB — PROTIME-INR
INR: 1.41 (ref 0.00–1.49)
Prothrombin Time: 16.9 seconds — ABNORMAL HIGH (ref 11.6–15.2)

## 2013-12-31 LAB — APTT: aPTT: 43 seconds — ABNORMAL HIGH (ref 24–37)

## 2013-12-31 LAB — PRO B NATRIURETIC PEPTIDE: Pro B Natriuretic peptide (BNP): 3105 pg/mL — ABNORMAL HIGH (ref 0–450)

## 2013-12-31 LAB — LACTIC ACID, PLASMA: LACTIC ACID, VENOUS: 2.1 mmol/L (ref 0.5–2.2)

## 2013-12-31 LAB — I-STAT TROPONIN, ED: Troponin i, poc: 0.03 ng/mL (ref 0.00–0.08)

## 2013-12-31 LAB — PHOSPHORUS: Phosphorus: 4.1 mg/dL (ref 2.3–4.6)

## 2013-12-31 LAB — PROCALCITONIN: PROCALCITONIN: 0.15 ng/mL

## 2013-12-31 LAB — MRSA PCR SCREENING: MRSA by PCR: NEGATIVE

## 2013-12-31 LAB — TROPONIN I: Troponin I: <0.3 ng/mL (ref <0.30)

## 2013-12-31 MED ORDER — DIGOXIN 0.25 MG/ML IJ SOLN
0.1250 mg | Freq: Every day | INTRAMUSCULAR | Status: DC
Start: 2013-12-31 — End: 2014-01-04
  Administered 2013-12-31 – 2014-01-03 (×4): 0.125 mg via INTRAVENOUS
  Filled 2013-12-31 (×6): qty 0.5

## 2013-12-31 MED ORDER — DABIGATRAN ETEXILATE MESYLATE 150 MG PO CAPS: 150.0000 mg | ORAL_CAPSULE | Freq: Two times a day (BID) | ORAL | Status: DC

## 2013-12-31 MED ORDER — LEVOFLOXACIN IN D5W 750 MG/150ML IV SOLN
750.0000 mg | Freq: Once | INTRAVENOUS | Status: DC
  Filled 2013-12-31: qty 150

## 2013-12-31 MED ORDER — SODIUM CHLORIDE 0.9 % IV SOLN: 250.0000 mL | INTRAVENOUS | Status: DC | PRN

## 2013-12-31 MED ORDER — AMIODARONE HCL 200 MG PO TABS: 200.0000 mg | ORAL_TABLET | Freq: Every day | ORAL | Status: DC

## 2013-12-31 MED ORDER — ENOXAPARIN SODIUM 80 MG/0.8ML ~~LOC~~ SOLN
1.0000 mg/kg | Freq: Two times a day (BID) | SUBCUTANEOUS | Status: DC
  Administered 2013-12-31 – 2014-01-03 (×6): 80 mg via SUBCUTANEOUS
  Filled 2013-12-31 (×8): qty 0.8

## 2013-12-31 MED ORDER — METHYLPREDNISOLONE SODIUM SUCC 125 MG IJ SOLR: 60.0000 mg | Freq: Two times a day (BID) | INTRAMUSCULAR | Status: DC

## 2013-12-31 MED ORDER — NITROGLYCERIN 2 % TD OINT
1.0000 [in_us] | TOPICAL_OINTMENT | Freq: Four times a day (QID) | TRANSDERMAL | Status: DC
Start: 2013-12-31 — End: 2013-12-31
  Administered 2013-12-31: 1 [in_us] via TOPICAL
  Filled 2013-12-31: qty 30

## 2013-12-31 MED ORDER — NITROGLYCERIN 2 % TD OINT
1.0000 [in_us] | TOPICAL_OINTMENT | Freq: Once | TRANSDERMAL | Status: AC
  Administered 2013-12-31: 1 [in_us] via TOPICAL
  Filled 2013-12-31: qty 1

## 2013-12-31 MED ORDER — INSULIN GLARGINE 100 UNIT/ML ~~LOC~~ SOLN
10.0000 [IU] | Freq: Every day | SUBCUTANEOUS | Status: DC
  Filled 2013-12-31: qty 0.1

## 2013-12-31 MED ORDER — NITROGLYCERIN 2 % TD OINT
1.0000 [in_us] | TOPICAL_OINTMENT | Freq: Four times a day (QID) | TRANSDERMAL | Status: DC | PRN
  Filled 2013-12-31: qty 30

## 2013-12-31 MED ORDER — HEPARIN SODIUM (PORCINE) 5000 UNIT/ML IJ SOLN
5000.0000 [IU] | Freq: Three times a day (TID) | INTRAMUSCULAR | Status: DC
  Filled 2013-12-31 (×2): qty 1

## 2013-12-31 MED ORDER — BISOPROLOL FUMARATE 5 MG PO TABS
5.0000 mg | ORAL_TABLET | Freq: Every day | ORAL | Status: DC
  Filled 2013-12-31: qty 1

## 2013-12-31 MED ORDER — PIPERACILLIN-TAZOBACTAM 3.375 G IVPB 30 MIN
3.3750 g | Freq: Once | INTRAVENOUS | Status: AC
  Administered 2013-12-31: 3.375 g via INTRAVENOUS
  Filled 2013-12-31: qty 50

## 2013-12-31 MED ORDER — MORPHINE SULFATE 2 MG/ML IJ SOLN
2.0000 mg | INTRAMUSCULAR | Status: DC | PRN
  Administered 2013-12-31: 2 mg via INTRAVENOUS
  Filled 2013-12-31: qty 1

## 2013-12-31 MED ORDER — FUROSEMIDE 10 MG/ML IJ SOLN
60.0000 mg | Freq: Four times a day (QID) | INTRAMUSCULAR | Status: DC
  Administered 2013-12-31 – 2014-01-01 (×2): 60 mg via INTRAVENOUS
  Filled 2013-12-31 (×6): qty 6

## 2013-12-31 MED ORDER — LORAZEPAM 2 MG/ML IJ SOLN: 1.0000 mg | Freq: Once | INTRAMUSCULAR | Status: DC

## 2013-12-31 MED ORDER — METHYLPREDNISOLONE SODIUM SUCC 125 MG IJ SOLR
125.0000 mg | Freq: Once | INTRAMUSCULAR | Status: AC
Start: 2013-12-31 — End: 2013-12-31
  Administered 2013-12-31: 125 mg via INTRAVENOUS
  Filled 2013-12-31: qty 2

## 2013-12-31 MED ORDER — IPRATROPIUM-ALBUTEROL 0.5-2.5 (3) MG/3ML IN SOLN
3.0000 mL | Freq: Once | RESPIRATORY_TRACT | Status: AC
  Administered 2013-12-31: 3 mL via RESPIRATORY_TRACT
  Filled 2013-12-31: qty 3

## 2013-12-31 MED ORDER — LORAZEPAM 2 MG/ML IJ SOLN
0.5000 mg | Freq: Once | INTRAMUSCULAR | Status: AC
  Administered 2013-12-31: 0.5 mg via INTRAVENOUS

## 2013-12-31 MED ORDER — FUROSEMIDE 10 MG/ML IJ SOLN
40.0000 mg | Freq: Once | INTRAMUSCULAR | Status: AC
Start: 2013-12-31 — End: 2013-12-31
  Administered 2013-12-31: 40 mg via INTRAVENOUS
  Filled 2013-12-31: qty 4

## 2013-12-31 MED ORDER — NITROGLYCERIN 2 % TD OINT: 1.0000 [in_us] | TOPICAL_OINTMENT | Freq: Four times a day (QID) | TRANSDERMAL | Status: DC | PRN

## 2013-12-31 MED ORDER — ATORVASTATIN CALCIUM 80 MG PO TABS: 80.0000 mg | ORAL_TABLET | Freq: Every day | ORAL | Status: DC

## 2013-12-31 MED ORDER — LEVOFLOXACIN IN D5W 750 MG/150ML IV SOLN
750.0000 mg | INTRAVENOUS | Status: DC
  Administered 2013-12-31 – 2014-01-02 (×2): 750 mg via INTRAVENOUS
  Filled 2013-12-31 (×4): qty 150

## 2013-12-31 MED ORDER — ALBUTEROL SULFATE (2.5 MG/3ML) 0.083% IN NEBU: 2.5000 mg | INHALATION_SOLUTION | RESPIRATORY_TRACT | Status: DC | PRN

## 2013-12-31 MED ORDER — DIGOXIN 125 MCG PO TABS
0.1250 mg | ORAL_TABLET | Freq: Every day | ORAL | Status: DC
  Filled 2013-12-31: qty 1

## 2013-12-31 MED ORDER — LEVOFLOXACIN IN D5W 750 MG/150ML IV SOLN: 750.0000 mg | INTRAVENOUS | Status: DC

## 2013-12-31 MED ORDER — ALBUTEROL SULFATE (2.5 MG/3ML) 0.083% IN NEBU
5.0000 mg | INHALATION_SOLUTION | Freq: Once | RESPIRATORY_TRACT | Status: AC
  Administered 2013-12-31: 5 mg via RESPIRATORY_TRACT
  Filled 2013-12-31: qty 6

## 2013-12-31 MED ORDER — ALBUTEROL SULFATE (2.5 MG/3ML) 0.083% IN NEBU: 5.0000 mg | INHALATION_SOLUTION | RESPIRATORY_TRACT | Status: AC | PRN

## 2013-12-31 MED ORDER — MORPHINE SULFATE 2 MG/ML IJ SOLN: 2.0000 mg | INTRAMUSCULAR | Status: DC | PRN

## 2013-12-31 MED ORDER — METHYLPREDNISOLONE SODIUM SUCC 125 MG IJ SOLR
60.0000 mg | Freq: Two times a day (BID) | INTRAMUSCULAR | Status: DC
  Administered 2014-01-01 (×2): 60 mg via INTRAVENOUS
  Administered 2014-01-02: 125 mg via INTRAVENOUS
  Filled 2013-12-31 (×5): qty 0.96

## 2013-12-31 MED ORDER — IPRATROPIUM-ALBUTEROL 0.5-2.5 (3) MG/3ML IN SOLN
3.0000 mL | RESPIRATORY_TRACT | Status: DC
  Administered 2013-12-31 – 2014-01-02 (×10): 3 mL via RESPIRATORY_TRACT
  Filled 2013-12-31 (×10): qty 3

## 2013-12-31 MED ORDER — LORAZEPAM 2 MG/ML IJ SOLN
1.0000 mg | Freq: Once | INTRAMUSCULAR | Status: AC
Start: 2013-12-31 — End: 2013-12-31
  Administered 2013-12-31: 1 mg via INTRAVENOUS
  Filled 2013-12-31: qty 1

## 2013-12-31 NOTE — ED Notes (Signed)
Per EMS called out for SOB x1 day, hx COPD, CP ~9am, went away on own. 95% on RA. Labored breathing, cardiac hx w/pacemaker w/ afib and stent. BP 148/54, P70, 22-24RR. CBG 168. 18g L AC.

## 2013-12-31 NOTE — ED Notes (Signed)
Pt. placed on Servo-i, NIV/PC mode for >'s WOB with >'d RR, RT to monitor.

## 2013-12-31 NOTE — Progress Notes (Signed)
ANTICOAGULATION CONSULT NOTE - Initial Consult  Pharmacy Consult for Lovenox Indication: atrial fibrillation  No Known Allergies  Patient Measurements: Height: 5' 8.9" (175 cm) Weight: 173 lb 15.1 oz (78.9 kg) (from 11/08/13) IBW/kg (Calculated) : 70.47 Heparin Dosing Weight: 79kg  Vital Signs: Temp: 99.6 F (37.6 C) (04/17 1115) Temp src: Oral (04/17 1115) BP: 160/88 mmHg (04/17 1700) Pulse Rate: 92 (04/17 1700)  Labs:  Recent Labs  12/31/13 1221 12/31/13 1720  HGB 14.0  --   HCT 44.6  --   PLT 152  --   APTT  --  43*  LABPROT  --  16.9*  INR  --  1.41  CREATININE 0.88  --   TROPONINI  --  <0.30    Estimated Creatinine Clearance: 63.4 ml/min (by C-G formula based on Cr of 0.88).   Medical History: Past Medical History  Diagnosis Date  . Pneumonia 12/06/10    healthcare-associated/ left lower lobe  . COPD (chronic obstructive pulmonary disease)     severe stage IV  . Atrial fibrillation     on Coumadin with [employer] single-chamber pacemaker  . Diabetes mellitus     type 2; s/p Prednisone therapy for pneumonia 12/2010  . Coronary artery disease     s/p anterior STEMI 09/2009; cath. revealed mid LAD 40%, distal LAD 100%- PTCA, prox. RCA 40%, mid. RCA 100% with good collateral filling of PDA; EF 25%; NSTEMI 07/2011 - PCI/DES 100% LCX  . ST elevation (STEMI) myocardial infarction 01//11/12    anterior  . CHF (congestive heart failure)     EF 25% on 09/26/10; EF 50-55% and grade 1 diastolic dysfunction on ECHO 08/2010   . Hypertension   . Hyperlipidemia   . AAA (abdominal aortic aneurysm)     3 cm infrarenal abdominal aortic aneurysm per aorta ultrasound 03/2010  . Osteoarthritis     s/p bilat hip arthroplasty  . Angina   . Ischemic cardiomyopathy     EF 35% LHC 11/12  . GERD (gastroesophageal reflux disease)   . Hypercholesteremia   . PVD (peripheral vascular disease)   . Anxiety     Medications:  Prescriptions prior to admission  Medication Sig  Dispense Refill  . albuterol (PROVENTIL) (2.5 MG/3ML) 0.083% nebulizer solution Take 2.5 mg by nebulization every 6 (six) hours as needed for wheezing or shortness of breath.      Marland Kitchen. amiodarone (PACERONE) 200 MG tablet Take 1 tablet (200 mg total) by mouth daily.  30 tablet  1  . beclomethasone (QVAR) 80 MCG/ACT inhaler Inhale 1 puff into the lungs 2 (two) times daily.      . bisoprolol (ZEBETA) 10 MG tablet Take 1 tablet (10 mg total) by mouth daily.  30 tablet  3  . cholecalciferol (VITAMIN D) 1000 UNITS tablet Take 2,000 Units by mouth daily.      . dabigatran (PRADAXA) 150 MG CAPS Take 150 mg by mouth every 12 (twelve) hours.      . digoxin (LANOXIN) 0.125 MG tablet Take 0.125 mg by mouth daily.      Marland Kitchen. diltiazem (DILACOR XR) 240 MG 24 hr capsule Take 240 mg by mouth daily.      . finasteride (PROSCAR) 5 MG tablet Take 5 mg by mouth daily.      . furosemide (LASIX) 20 MG tablet Take 20 mg by mouth daily. TAKE one daily      . glimepiride (AMARYL) 2 MG tablet Take 1 tablet (2 mg total) by mouth daily  with breakfast.  30 tablet  6  . guaiFENesin (MUCINEX) 600 MG 12 hr tablet Take 600 mg by mouth at bedtime.       . isosorbide mononitrate (IMDUR) 60 MG 24 hr tablet Take 60 mg by mouth daily.        Marland Kitchen. levalbuterol (XOPENEX) 1.25 MG/0.5ML nebulizer solution Take 1.25 mg by nebulization every 6 (six) hours.  1 each  12  . nitroGLYCERIN (NITROSTAT) 0.4 MG SL tablet Place 0.4 mg under the tongue every 5 (five) minutes as needed. For chest pain      . predniSONE (DELTASONE) 10 MG tablet Take 5 mg by mouth daily.      . rosuvastatin (CRESTOR) 40 MG tablet Take 40 mg by mouth daily.      . sennosides-docusate sodium (SENOKOT-S) 8.6-50 MG tablet Take 2 tablets by mouth at bedtime.       . Tamsulosin HCl (FLOMAX) 0.4 MG CAPS Take 0.4 mg by mouth daily.      Marland Kitchen. acetylcysteine (MUCOMYST) 10 % nebulizer solution 6 ML until nebulized. Do not use more than 4 times each month  30 mL  1  . ipratropium (ATROVENT)  0.02 % nebulizer solution Take 2.5 mLs (0.5 mg total) by nebulization every 6 (six) hours.  75 mL  12    Assessment: 80 year old man on Pradaxa prior to admission for afib who is unable to take oral medications.  Asked to start heparin.  Asked MD if Lovenox would be appropriate (no procedures planned).  Dr. Delane GingerGill agreed to the change. Goal of Therapy:  Anti-Xa level 0.6-1 units/ml 4hrs after LMWH dose given Monitor platelets by anticoagulation protocol: Yes   Plan:  Lovenox 80mg  SQ q12 Monitor renal function, ability to resume Pradaxa, and for any bleeding complications.  [name] 12/31/2013,7:12 PM

## 2013-12-31 NOTE — ED Notes (Signed)
PT completed 3/4 neb trtmt. States he can't breathe and can't tolerate neb or neb mask. PT is agitated and restless. MD notified

## 2013-12-31 NOTE — ED Notes (Signed)
Respiratory @ bedside

## 2013-12-31 NOTE — ED Notes (Signed)
PT increased labored breathing, increased pursed lip breathing. Dr Rubin PayorPickering at bedside, ordered bipap and ABG. Respiratory called.

## 2013-12-31 NOTE — ED Notes (Signed)
Respiratory at bedside getting ABG

## 2013-12-31 NOTE — H&P (Signed)
PULMONARY / CRITICAL CARE MEDICINE   Name: [name] MRN: [id number] DOB: 04-05-1928    ADMISSION DATE:  12/31/2013 CONSULTATION DATE:  12/31/2013  REFERRING MD :  ED PRIMARY SERVICE: PCCM  CHIEF COMPLAINT:  Shortness of breath, weakness.   HISTORY OF PRESENT ILLNESS:  80 yo male with COPD (on chronic steroids, not on home O2, never intubated), CHF, Afib, CAD s/p stenting, HTN, and anxiety who presents to ED with worsening SOB since this AM.  Daughter is present and states that she noticed he has been more weak for about 2 weeks and woke up this AM feeling SOB and unable to do his nebulizer treatments.  She denies any fever/chills, URI symptoms, N/V/D/C, or abdominal pain.  She reports he had some CP this AM that lasted for "a few seconds."  He has also experienced increased cough with clear sputum production.    LINES / TUBES: PIV 4/17>>  CULTURES: MRSA PCR 4/17>> NEG UA 4/17>>  ANTIBIOTICS: Levaquin 4/17>> Zosyn 4/17 x 1  PAST MEDICAL HISTORY :  Past Medical History  Diagnosis Date  . Pneumonia 12/06/10    healthcare-associated/ left lower lobe  . COPD (chronic obstructive pulmonary disease)     severe stage IV  . Atrial fibrillation     on Coumadin with St. Jude single-chamber pacemaker  . Diabetes mellitus     type 2; s/p Prednisone therapy for pneumonia 12/2010  . Coronary artery disease     s/p anterior STEMI 09/2009; cath. revealed mid LAD 40%, distal LAD 100%- PTCA, prox. RCA 40%, mid. RCA 100% with good collateral filling of PDA; EF 25%; NSTEMI 07/2011 - PCI/DES 100% LCX  . ST elevation (STEMI) myocardial infarction 01//11/12    anterior  . CHF (congestive heart failure)     EF 25% on 09/26/10; EF 50-55% and grade 1 diastolic dysfunction on ECHO 08/2010   . Hypertension   . Hyperlipidemia   . AAA (abdominal aortic aneurysm)     3 cm infrarenal abdominal aortic aneurysm per aorta ultrasound 03/2010  . Osteoarthritis     s/p bilat hip arthroplasty  . Angina    . Ischemic cardiomyopathy     EF 35% LHC 11/12  . GERD (gastroesophageal reflux disease)   . Hypercholesteremia   . PVD (peripheral vascular disease)   . Anxiety    Past Surgical History  Procedure Laterality Date  . Hip arthroplasty      s/p right 09/27/10 and s/p left 09/24/10  . Back surgery    . Knee surgery    . Pacemaker insertion  12/2010    St. Jude single-chamber   . Cardiac catheterization  09/2009, 06/2007    PTCA to distal LAD   Prior to Admission medications   Medication Sig Start Date End Date Taking? Authorizing Provider  albuterol (PROVENTIL) (2.5 MG/3ML) 0.083% nebulizer solution Take 2.5 mg by nebulization every 6 (six) hours as needed for wheezing or shortness of breath.   Yes Historical Provider, MD  amiodarone (PACERONE) 200 MG tablet Take 1 tablet (200 mg total) by mouth daily. 04/14/12  Yes Martha ClanWilliam Shaw, MD  beclomethasone (QVAR) 80 MCG/ACT inhaler Inhale 1 puff into the lungs 2 (two) times daily.   Yes Historical Provider, MD  bisoprolol (ZEBETA) 10 MG tablet Take 1 tablet (10 mg total) by mouth daily. 03/19/12 04/08/14 Yes Brooke O Edmisten, PA-C  cholecalciferol (VITAMIN D) 1000 UNITS tablet Take 2,000 Units by mouth daily.   Yes Historical Provider, MD  dabigatran (PRADAXA) 150  MG CAPS Take 150 mg by mouth every 12 (twelve) hours.   Yes Historical Provider, MD  digoxin (LANOXIN) 0.125 MG tablet Take 0.125 mg by mouth daily.   Yes Historical Provider, MD  diltiazem (DILACOR XR) 240 MG 24 hr capsule Take 240 mg by mouth daily.   Yes Historical Provider, MD  finasteride (PROSCAR) 5 MG tablet Take 5 mg by mouth daily.   Yes Historical Provider, MD  furosemide (LASIX) 20 MG tablet Take 20 mg by mouth daily. TAKE one daily 06/15/12  Yes Marinus Maw, MD  glimepiride (AMARYL) 2 MG tablet Take 1 tablet (2 mg total) by mouth daily with breakfast. 09/17/13  Yes Martha Clan, MD  guaiFENesin (MUCINEX) 600 MG 12 hr tablet Take 600 mg by mouth at bedtime.    Yes Historical  Provider, MD  isosorbide mononitrate (IMDUR) 60 MG 24 hr tablet Take 60 mg by mouth daily.     Yes Historical Provider, MD  levalbuterol (XOPENEX) 1.25 MG/0.5ML nebulizer solution Take 1.25 mg by nebulization every 6 (six) hours. 09/17/13  Yes Martha Clan, MD  nitroGLYCERIN (NITROSTAT) 0.4 MG SL tablet Place 0.4 mg under the tongue every 5 (five) minutes as needed. For chest pain   Yes Historical Provider, MD  predniSONE (DELTASONE) 10 MG tablet Take 5 mg by mouth daily. 09/17/13  Yes Martha Clan, MD  rosuvastatin (CRESTOR) 40 MG tablet Take 40 mg by mouth daily.   Yes Historical Provider, MD  sennosides-docusate sodium (SENOKOT-S) 8.6-50 MG tablet Take 2 tablets by mouth at bedtime.    Yes Historical Provider, MD  Tamsulosin HCl (FLOMAX) 0.4 MG CAPS Take 0.4 mg by mouth daily.   Yes Historical Provider, MD  acetylcysteine (MUCOMYST) 10 % nebulizer solution 6 ML until nebulized. Do not use more than 4 times each month 10/11/13   Kalman Shan, MD  ipratropium (ATROVENT) 0.02 % nebulizer solution Take 2.5 mLs (0.5 mg total) by nebulization every 6 (six) hours. 09/17/13   Martha Clan, MD   No Known Allergies  FAMILY HISTORY:  Family History  Problem Relation Age of Onset  . Heart attack Mother 26  . Heart attack Father 84  . Cancer Other     siblings  . Heart attack Other    SOCIAL HISTORY:  reports that he quit smoking about 4 years ago. His smoking use included Cigarettes. He has a 100 pack-year smoking history. He has never used smokeless tobacco. He reports that he drinks alcohol. He reports that he does not use illicit drugs.  REVIEW OF SYSTEMS:  As noted in HPI.   VITAL SIGNS: Temp:  [99.6 F (37.6 C)-100 F (37.8 C)] 100 F (37.8 C) (04/17 1900) Pulse Rate:  [56-123] 77 (04/17 2100) Resp:  [18-39] 25 (04/17 2100) BP: (102-173)/(43-111) 126/58 mmHg (04/17 2100) SpO2:  [93 %-100 %] 99 % (04/17 2100) FiO2 (%):  [35 %] 35 % (04/17 2041) Weight:  [173 lb 15.1 oz (78.9 kg)] 173 lb  15.1 oz (78.9 kg) (04/17 1717)  HEMODYNAMICS:   VENTILATOR SETTINGS: Vent Mode:  [-] BIPAP FiO2 (%):  [35 %] 35 % Set Rate:  [15 bmp] 15 bmp PEEP:  [5 cmH20] 5 cmH20  INTAKE / OUTPUT: Intake/Output     04/17 0701 - 04/18 0700   I.V. (mL/kg) 20 (0.3)   Total Intake(mL/kg) 20 (0.3)   Net +20         PHYSICAL EXAMINATION: General:  Pt is on BiPAP and appears significantly anxious. Neuro:  Unable to  assess HEENT:  [place]/AT, BiPAP Cardiovascular:  tachycardic, irregular rhythm, S1/S2 present Lungs:  Diaphoretic, tachypneic, increased work of breathing, accessory muscle use, diffuse wheezing with crackles Abdomen:  +BS, soft, NT/ND Musculoskeletal:   Skin:  warm, dry, b/l LE trace edema   LABS:  CBC  Recent Labs Lab 12/31/13 1221  WBC 11.0*  HGB 14.0  HCT 44.6  PLT 152   Coag's  Recent Labs Lab 12/31/13 1720  APTT 43*  INR 1.41   BMET  Recent Labs Lab 12/31/13 1221  NA 142  K 4.5  CL 104  CO2 23  BUN 13  CREATININE 0.88  GLUCOSE 150*   Electrolytes  Recent Labs Lab 12/31/13 1221 12/31/13 1720  CALCIUM 9.2  --   MG  --  1.7  PHOS  --  4.1   Sepsis Markers  Recent Labs Lab 12/31/13 1720  LATICACIDVEN 2.1  PROCALCITON 0.15   ABG  Recent Labs Lab 12/31/13 1619  PHART 7.303*  PCO2ART 47.9*  PO2ART 82.0   Liver Enzymes  Recent Labs Lab 12/31/13 1221  AST 18  ALT 18  ALKPHOS 75  BILITOT 0.8  ALBUMIN 3.7   Cardiac Enzymes  Recent Labs Lab 12/31/13 1221 12/31/13 1720  TROPONINI  --  <0.30  PROBNP 3105.0*  --    Glucose No results found for this basename: GLUCAP,  in the last 168 hours  Imaging Dg Chest Port 1 View  12/31/2013   CLINICAL DATA:  Shortness of breath  EXAM: PORTABLE CHEST - 1 VIEW  COMPARISON:  DG CHEST 2 VIEW dated 11/08/2013  FINDINGS: Left-sided pacer in place. Mild cardiomegaly persists without evidence for edema. Emphysematous changes are reidentified with scarring at the lung bases. Trace right pleural  fluid or thickening reidentified. No focal parenchymal opacification. No acute osseous abnormality.  IMPRESSION: Emphysematous changes are reidentified without new focal acute finding.  Trace right pleural fluid or thickening.   Electronically Signed   By: Christiana PellantGretchen  Green M.D.   On: 12/31/2013 13:00   PULMONARY A: Acute on chronic respiratory failure secondary to AECOPD vs. CHF (07/09/13 LVEF 35-40%) P:   BiPAP Duonebs q4h Albuterol q2h PRN Solumedrol 60mg  q12h Continuous pulse oximetry Hold home meds  CARDIOVASCULAR A:  Possible combined diastolic/systolic CHF exacerbation Permanent atrial fibrillation with HR controlled on pradaxa  H/o Vtach, permanent pacemaker  CAD s/p stenting Chest pain HTN H/o AAA P:  Telemetry Troponins x3 lasix 40mg  x 1 lasix 60mg  q6h Morphine 2-4mg  q2h PRN pain NTG paste q6h PRN pain Continue home digoxin 0.125mg  IV qd Hold home amiodarone, diltiazem, imdur, crestor, bisoprolol Check digoxin level, TSH  RENAL A:   BPH P:   D/c home tamsulosin, finasteride Monitor BMP  Monitor I/Os Correct electrolytes as indicated  GASTROINTESTINAL A:   Nutrition GI px  P:   NPO on BiPAP GI px not indicated  HEMATOLOGIC A:   VTE px Chronic anticoagulation on home pradaxa  P:  d/c pradaxa as NPO VTE px: Lovenox, SCDs Monitor CBC   INFECTIOUS A:   Leukocytosis, on chronic steroids P:   PCT CXR in AM Micro and abx as above  ENDOCRINE A:   DM II secondary to chronic steroids P:   CBG monitoring Lantus 10 Units qd D/c home glimepiride  NEUROLOGIC A:  Acute encephalopathy  P:   Continue above  Code: DNR Family: Daughter updated at  Bedside in ED, understands that her father is critically ill and may not survive this illness.  We will  be aggressive with morphine, bipap, BP control and diuretics, but if he doesn't respond with improved dyspnea we will use a morphine drip and focus on comfort.  Boykin Peek, MD Internal Medicine  Teaching Service, PGY-1   Attending:  I have seen and examined the patient with nurse practitioner/resident and agree with the note above.   Very severe COPD at baseline now with primarily a CHF exacerbation and likely some degree of a COPD exacerbation.  He seems incredibly uncomfortable in the ED on BIPAP.  Will not intubate per his prior stated desire.  Will use diuretics, morphine, and BP control as well as treatment for COPD and will hope for a quick turn around.  However if he does not have improved dyspnea will need to use continuous morphine and focus on comfort measures.  CC time 40 minutes  Heber Rockham, MD [place] [facility] Pager: [phone] Cell: [phone] If no response, call [phone]

## 2013-12-31 NOTE — ED Notes (Signed)
PT appears to have increased WOB using ursed lips w/ rr in low 30's. O2 sat 97%, P 76

## 2013-12-31 NOTE — ED Notes (Signed)
PT still sounds diminished after breathing treatment

## 2013-12-31 NOTE — ED Notes (Signed)
PT showing some labored breathing/pursed lips so going to MD to ask about another neb trtmt

## 2013-12-31 NOTE — ED Provider Notes (Signed)
CSN: [id number]     Arrival date & time 12/31/13  1108 History   First MD Initiated Contact with Patient 12/31/13 1210     Chief Complaint  Patient presents with  . Chest Pain  . Shortness of Breath     (Consider location/radiation/quality/duration/timing/severity/associated sxs/prior Treatment) Patient is a 80 y.o. male presenting with chest pain and shortness of breath. The history is provided by the patient.  Chest Pain Associated symptoms: fatigue and shortness of breath   Associated symptoms: no abdominal pain, no back pain, no headache, no nausea, no numbness, not vomiting and no weakness   Shortness of Breath Associated symptoms: chest pain   Associated symptoms: no abdominal pain, no headaches, no rash and no vomiting    patient with shortness of breath chest pain. Has had some chills without fever. He's had a cough that has been little more productive. His been feeling bad for the week but worse for the last day. No relief with his treatment at home. He is on 5 mg of steroids chronically, but states that he took 10 mg yesterday.  Past Medical History  Diagnosis Date  . Pneumonia 12/06/10    healthcare-associated/ left lower lobe  . COPD (chronic obstructive pulmonary disease)     severe stage IV  . Atrial fibrillation     on Coumadin with St. Jude single-chamber pacemaker  . Diabetes mellitus     type 2; s/p Prednisone therapy for pneumonia 12/2010  . Coronary artery disease     s/p anterior STEMI 09/2009; cath. revealed mid LAD 40%, distal LAD 100%- PTCA, prox. RCA 40%, mid. RCA 100% with good collateral filling of PDA; EF 25%; NSTEMI 07/2011 - PCI/DES 100% LCX  . ST elevation (STEMI) myocardial infarction 01//11/12    anterior  . CHF (congestive heart failure)     EF 25% on 09/26/10; EF 50-55% and grade 1 diastolic dysfunction on ECHO 08/2010   . Hypertension   . Hyperlipidemia   . AAA (abdominal aortic aneurysm)     3 cm infrarenal abdominal aortic aneurysm per aorta  ultrasound 03/2010  . Osteoarthritis     s/p bilat hip arthroplasty  . Angina   . Ischemic cardiomyopathy     EF 35% LHC 11/12  . GERD (gastroesophageal reflux disease)   . Hypercholesteremia   . PVD (peripheral vascular disease)   . Anxiety    Past Surgical History  Procedure Laterality Date  . Hip arthroplasty      s/p right 09/27/10 and s/p left 09/24/10  . Back surgery    . Knee surgery    . Pacemaker insertion  12/2010    St. Jude single-chamber   . Cardiac catheterization  09/2009, 06/2007    PTCA to distal LAD   Family History  Problem Relation Age of Onset  . Heart attack Mother 65  . Heart attack Father 93  . Cancer Other     siblings  . Heart attack Other    History  Substance Use Topics  . Smoking status: Former Smoker -- 2.00 packs/day for 50 years    Types: Cigarettes    Quit date: 09/02/2009  . Smokeless tobacco: Never Used     Comment: smoked for 33yrs quit for 57yrs started back & smoked for 10ys  . Alcohol Use: Yes     Comment: occ - alcohol    Review of Systems  Constitutional: Positive for appetite change and fatigue. Negative for activity change.  Eyes: Negative for pain.  Respiratory:  Positive for shortness of breath. Negative for chest tightness.   Cardiovascular: Positive for chest pain. Negative for leg swelling.  Gastrointestinal: Negative for nausea, vomiting, abdominal pain and diarrhea.  Genitourinary: Negative for flank pain.  Musculoskeletal: Negative for back pain and neck stiffness.  Skin: Negative for rash.  Neurological: Negative for weakness, numbness and headaches.  Psychiatric/Behavioral: Negative for behavioral problems.      Allergies  Review of patient's allergies indicates no known allergies.  Home Medications   Prior to Admission medications   Medication Sig Start Date End Date Taking? Authorizing Provider  albuterol (PROVENTIL) (2.5 MG/3ML) 0.083% nebulizer solution Take 2.5 mg by nebulization every 6 (six)  hours as needed for wheezing or shortness of breath.   Yes Historical Provider, MD  amiodarone (PACERONE) 200 MG tablet Take 1 tablet (200 mg total) by mouth daily. 04/14/12  Yes Martha ClanWilliam Shaw, MD  beclomethasone (QVAR) 80 MCG/ACT inhaler Inhale 1 puff into the lungs 2 (two) times daily.   Yes Historical Provider, MD  bisoprolol (ZEBETA) 10 MG tablet Take 1 tablet (10 mg total) by mouth daily. 03/19/12 04/08/14 Yes Brooke O Edmisten, PA-C  cholecalciferol (VITAMIN D) 1000 UNITS tablet Take 2,000 Units by mouth daily.   Yes Historical Provider, MD  dabigatran (PRADAXA) 150 MG CAPS Take 150 mg by mouth every 12 (twelve) hours.   Yes Historical Provider, MD  digoxin (LANOXIN) 0.125 MG tablet Take 0.125 mg by mouth daily.   Yes Historical Provider, MD  diltiazem (DILACOR XR) 240 MG 24 hr capsule Take 240 mg by mouth daily.   Yes Historical Provider, MD  finasteride (PROSCAR) 5 MG tablet Take 5 mg by mouth daily.   Yes Historical Provider, MD  furosemide (LASIX) 20 MG tablet Take 20 mg by mouth daily. TAKE one daily 06/15/12  Yes Marinus MawGregg W Taylor, MD  glimepiride (AMARYL) 2 MG tablet Take 1 tablet (2 mg total) by mouth daily with breakfast. 09/17/13  Yes Martha ClanWilliam Shaw, MD  guaiFENesin (MUCINEX) 600 MG 12 hr tablet Take 600 mg by mouth at bedtime.    Yes Historical Provider, MD  isosorbide mononitrate (IMDUR) 60 MG 24 hr tablet Take 60 mg by mouth daily.     Yes Historical Provider, MD  levalbuterol (XOPENEX) 1.25 MG/0.5ML nebulizer solution Take 1.25 mg by nebulization every 6 (six) hours. 09/17/13  Yes Martha ClanWilliam Shaw, MD  nitroGLYCERIN (NITROSTAT) 0.4 MG SL tablet Place 0.4 mg under the tongue every 5 (five) minutes as needed. For chest pain   Yes Historical Provider, MD  predniSONE (DELTASONE) 10 MG tablet Take 5 mg by mouth daily. 09/17/13  Yes Martha ClanWilliam Shaw, MD  rosuvastatin (CRESTOR) 40 MG tablet Take 40 mg by mouth daily.   Yes Historical Provider, MD  sennosides-docusate sodium (SENOKOT-S) 8.6-50 MG tablet Take 2  tablets by mouth at bedtime.    Yes Historical Provider, MD  Tamsulosin HCl (FLOMAX) 0.4 MG CAPS Take 0.4 mg by mouth daily.   Yes Historical Provider, MD  acetylcysteine (MUCOMYST) 10 % nebulizer solution 6 ML until nebulized. Do not use more than 4 times each month 10/11/13   Kalman ShanMurali Ramaswamy, MD  ipratropium (ATROVENT) 0.02 % nebulizer solution Take 2.5 mLs (0.5 mg total) by nebulization every 6 (six) hours. 09/17/13   Martha ClanWilliam Shaw, MD   BP 110/71  Pulse 92  Temp(Src) 98 F (36.7 C) (Oral)  Resp 20  Ht 5' 8.9" (1.75 m)  Wt 177 lb 14.6 oz (80.7 kg)  BMI 26.35 kg/m2  SpO2 96% Physical  Exam  Constitutional: He is oriented to person, place, and time. He appears well-developed and well-nourished.  HENT:  Head: Normocephalic.  Eyes: Pupils are equal, round, and reactive to light.  Cardiovascular: Normal rate and regular rhythm.   Pulmonary/Chest:  Rhonchi on left lung fields. Right lung fields relatively clear. Minimal wheezing.  Abdominal: Soft.  Musculoskeletal: He exhibits edema.  Trace bilateral lower extremity pitting edema.  Neurological: He is alert and oriented to person, place, and time.  Skin: Skin is warm.    ED Course  Procedures (including critical care time) Labs Review Labs Reviewed  CBC - Abnormal; Notable for the following:    WBC 11.0 (*)    All other components within normal limits  COMPREHENSIVE METABOLIC PANEL - Abnormal; Notable for the following:    Glucose, Bld 150 (*)    GFR calc non Af Amer 77 (*)    GFR calc Af Amer 90 (*)    All other components within normal limits  PRO B NATRIURETIC PEPTIDE - Abnormal; Notable for the following:    Pro B Natriuretic peptide (BNP) 3105.0 (*)    All other components within normal limits  PROTIME-INR - Abnormal; Notable for the following:    Prothrombin Time 16.9 (*)    All other components within normal limits  APTT - Abnormal; Notable for the following:    aPTT 43 (*)    All other components within normal limits   BASIC METABOLIC PANEL - Abnormal; Notable for the following:    Glucose, Bld 215 (*)    Creatinine, Ser 1.36 (*)    GFR calc non Af Amer 46 (*)    GFR calc Af Amer 54 (*)    All other components within normal limits  URINALYSIS, ROUTINE W REFLEX MICROSCOPIC - Abnormal; Notable for the following:    Protein, ur 30 (*)    All other components within normal limits  GLUCOSE, CAPILLARY - Abnormal; Notable for the following:    Glucose-Capillary 246 (*)    All other components within normal limits  GLUCOSE, CAPILLARY - Abnormal; Notable for the following:    Glucose-Capillary 198 (*)    All other components within normal limits  GLUCOSE, CAPILLARY - Abnormal; Notable for the following:    Glucose-Capillary 190 (*)    All other components within normal limits  GLUCOSE, CAPILLARY - Abnormal; Notable for the following:    Glucose-Capillary 154 (*)    All other components within normal limits  GLUCOSE, CAPILLARY - Abnormal; Notable for the following:    Glucose-Capillary 193 (*)    All other components within normal limits  I-STAT ARTERIAL BLOOD GAS, ED - Abnormal; Notable for the following:    pH, Arterial 7.303 (*)    pCO2 arterial 47.9 (*)    Acid-base deficit 3.0 (*)    All other components within normal limits  MRSA PCR SCREENING  TROPONIN I  TROPONIN I  TROPONIN I  LACTIC ACID, PLASMA  PROCALCITONIN  PHOSPHORUS  MAGNESIUM  DIGOXIN LEVEL  TSH  URINE MICROSCOPIC-ADD ON  BASIC METABOLIC PANEL  CBC WITH DIFFERENTIAL  Rosezena Sensor, ED    Imaging Review Dg Chest Port 1 View  01/01/2014   CLINICAL DATA:  Short of breath  EXAM: PORTABLE CHEST - 1 VIEW  COMPARISON:  Prior chest x-ray 12/31/2013  FINDINGS: Stable position of left subclavian approach cardiac rhythm maintenance device. Lead projects over the right ventricle unchanged cardiomegaly. Stable mediastinal contours. Atherosclerotic calcifications in the transverse aorta. Increased airspace opacity in  the right upper  lobe layering along the minor fissure. Small right pleural effusion and right basilar atelectasis similar compared to prior. No edema. No pneumothorax. No acute osseous abnormality.  IMPRESSION: Developing airspace opacity in the right upper lobe along the minor fissure concerning for infiltrate/pneumonia.  Stable right small pleural effusion versus pleural thickening.   Electronically Signed   By: Malachy MoanHeath  McCullough M.D.   On: 01/01/2014 09:19   Dg Chest Port 1 View  12/31/2013   CLINICAL DATA:  Shortness of breath  EXAM: PORTABLE CHEST - 1 VIEW  COMPARISON:  DG CHEST 2 VIEW dated 11/08/2013  FINDINGS: Left-sided pacer in place. Mild cardiomegaly persists without evidence for edema. Emphysematous changes are reidentified with scarring at the lung bases. Trace right pleural fluid or thickening reidentified. No focal parenchymal opacification. No acute osseous abnormality.  IMPRESSION: Emphysematous changes are reidentified without new focal acute finding.  Trace right pleural fluid or thickening.   Electronically Signed   By: Christiana PellantGretchen  Green M.D.   On: 12/31/2013 13:00     EKG Interpretation   Date/Time:  Friday December 31 2013 11:12:52 EDT Ventricular Rate:  74 PR Interval:    QRS Duration: 122 QT Interval:  367 QTC Calculation: 407 R Axis:   -64 Text Interpretation:  Atrial fibrillation Ventricular bigeminy Nonspecific  IVCD with LAD Anteroseptal infarct, age indeterminate Lateral leads are  also involved Confirmed by Rubin PayorPICKERING  MD, Harrold DonathNATHAN [phone]) on 12/31/2013  12:16:16 PM      MDM   Final diagnoses:  HCAP (healthcare-associated pneumonia)  COPD (chronic obstructive pulmonary disease)    Patient with shortness of breath. Clinically has pneumonia. Will admit to internal medicine. Patient has chronic lung problems. He is a DO NOT RESUSCITATE, but does have severe pulmonary disease. He has had a decompensation while he was in the ER. Initially was going to be a medicine admission, but  critical care consult. BiPAP had been started.  CRITICAL CARE Performed by: Juliet RudeNathan R. Tyon Cerasoli Total critical care time: 30 Critical care time was exclusive of separately billable procedures and treating other patients. Critical care was necessary to treat or prevent imminent or life-threatening deterioration. Critical care was time spent personally by me on the following activities: development of treatment plan with patient and/or surrogate as well as nursing, discussions with consultants, evaluation of patient's response to treatment, examination of patient, obtaining history from patient or surrogate, ordering and performing treatments and interventions, ordering and review of laboratory studies, ordering and review of radiographic studies, pulse oximetry and re-evaluation of patient's condition.    Juliet RudeNathan R. Rubin PayorPickering, MD 01/01/14 2108

## 2013-12-31 NOTE — ED Notes (Signed)
MD at bedside. CCU

## 2013-12-31 NOTE — ED Notes (Signed)
RT note: Pt. transported on [facility] to 2M09 uneventful transport.

## 2014-01-01 ENCOUNTER — Inpatient Hospital Stay (HOSPITAL_COMMUNITY): Payer: Medicare Other

## 2014-01-01 DIAGNOSIS — J449 Chronic obstructive pulmonary disease, unspecified: Secondary | ICD-10-CM

## 2014-01-01 LAB — URINALYSIS, ROUTINE W REFLEX MICROSCOPIC
BILIRUBIN URINE: NEGATIVE
Glucose, UA: NEGATIVE mg/dL
HGB URINE DIPSTICK: NEGATIVE
Ketones, ur: NEGATIVE mg/dL
Leukocytes, UA: NEGATIVE
NITRITE: NEGATIVE
Protein, ur: 30 mg/dL — AB
SPECIFIC GRAVITY, URINE: 1.015 (ref 1.005–1.030)
Urobilinogen, UA: 0.2 mg/dL (ref 0.0–1.0)
pH: 5 (ref 5.0–8.0)

## 2014-01-01 LAB — BASIC METABOLIC PANEL
BUN: 23 mg/dL (ref 6–23)
CHLORIDE: 102 meq/L (ref 96–112)
CO2: 22 mEq/L (ref 19–32)
Calcium: 9.2 mg/dL (ref 8.4–10.5)
Creatinine, Ser: 1.36 mg/dL — ABNORMAL HIGH (ref 0.50–1.35)
GFR calc Af Amer: 54 mL/min — ABNORMAL LOW (ref >90)
GFR calc non Af Amer: 46 mL/min — ABNORMAL LOW (ref >90)
GLUCOSE: 215 mg/dL — AB (ref 70–99)
Potassium: 4.1 mEq/L (ref 3.7–5.3)
Sodium: 140 mEq/L (ref 137–147)

## 2014-01-01 LAB — TROPONIN I

## 2014-01-01 LAB — URINE MICROSCOPIC-ADD ON

## 2014-01-01 LAB — GLUCOSE, CAPILLARY
GLUCOSE-CAPILLARY: 190 mg/dL — AB (ref 70–99)
Glucose-Capillary: 154 mg/dL — ABNORMAL HIGH (ref 70–99)
Glucose-Capillary: 193 mg/dL — ABNORMAL HIGH (ref 70–99)
Glucose-Capillary: 198 mg/dL — ABNORMAL HIGH (ref 70–99)
Glucose-Capillary: 246 mg/dL — ABNORMAL HIGH (ref 70–99)

## 2014-01-01 LAB — DIGOXIN LEVEL: DIGOXIN LVL: 1.1 ng/mL (ref 0.8–2.0)

## 2014-01-01 MED ORDER — INSULIN ASPART 100 UNIT/ML ~~LOC~~ SOLN
0.0000 [IU] | SUBCUTANEOUS | Status: DC
  Administered 2014-01-01 (×4): 3 [IU] via SUBCUTANEOUS
  Administered 2014-01-01: 5 [IU] via SUBCUTANEOUS
  Administered 2014-01-01: 3 [IU] via SUBCUTANEOUS
  Administered 2014-01-02: 5 [IU] via SUBCUTANEOUS
  Administered 2014-01-02: 2 [IU] via SUBCUTANEOUS
  Administered 2014-01-02: 3 [IU] via SUBCUTANEOUS
  Administered 2014-01-02: 5 [IU] via SUBCUTANEOUS
  Administered 2014-01-02: 3 [IU] via SUBCUTANEOUS

## 2014-01-01 MED ORDER — SODIUM CHLORIDE 0.9 % IV SOLN: 250.0000 mL | INTRAVENOUS | Status: DC | PRN

## 2014-01-01 MED ORDER — INSULIN ASPART 100 UNIT/ML ~~LOC~~ SOLN: 0.0000 [IU] | Freq: Three times a day (TID) | SUBCUTANEOUS | Status: DC

## 2014-01-01 MED ORDER — FUROSEMIDE 10 MG/ML IJ SOLN: 60.0000 mg | Freq: Three times a day (TID) | INTRAMUSCULAR | Status: DC

## 2014-01-01 NOTE — Progress Notes (Signed)
CCM Interim Progress Note.  Cr 0.8 --> 1.38, getting lasix 60 q6h Diuresed ~1L today  Hold lasix today.  Stacy GardnerNeema Ahtziri Jeffries, MD (PGY3, IMTS)

## 2014-01-01 NOTE — Progress Notes (Signed)
PULMONARY / CRITICAL CARE MEDICINE   Name: [name] MRN: [id number] DOB: Sep 19, 1927    ADMISSION DATE:  12/31/2013 CONSULTATION DATE:  12/31/2013  REFERRING MD :  ED PRIMARY SERVICE: PCCM  CHIEF COMPLAINT:  Shortness of breath, weakness.   HISTORY OF PRESENT ILLNESS:  80 yo male with COPD (on chronic steroids, not on home O2, never intubated), CHF, Afib, CAD s/p stenting, HTN, and anxiety who presents to ED with worsening SOB since.  LINES / TUBES: PIV 4/17>>  CULTURES: MRSA PCR 4/17>> NEG UA 4/17>>  ANTIBIOTICS: Levaquin 4/17>> Zosyn 4/17 x 1  Subjective: improved, off nimv, neg 575  VITAL SIGNS: Temp:  [97.8 F (36.6 C)-100 F (37.8 C)] 98.7 F (37.1 C) (04/18 0730) Pulse Rate:  [40-123] 81 (04/18 0800) Resp:  [18-39] 19 (04/18 0800) BP: (96-173)/(42-111) 96/42 mmHg (04/18 0800) SpO2:  [93 %-100 %] 94 % (04/18 0800) FiO2 (%):  [35 %] 35 % (04/18 0000) Weight:  [78.9 kg (173 lb 15.1 oz)-80.7 kg (177 lb 14.6 oz)] 80.7 kg (177 lb 14.6 oz) (04/18 0200)  HEMODYNAMICS:   VENTILATOR SETTINGS: Vent Mode:  [-] BIPAP FiO2 (%):  [35 %] 35 % Set Rate:  [15 bmp] 15 bmp PEEP:  [5 cmH20] 5 cmH20  INTAKE / OUTPUT: Intake/Output     04/17 0701 - 04/18 0700 04/18 0701 - 04/19 0700   P.O. 480    I.V. (mL/kg) 110 (1.4) 20 (0.2)   IV Piggyback 150    Total Intake(mL/kg) 740 (9.2) 20 (0.2)   Urine (mL/kg/hr) 1315 175 (1.2)   Total Output 1315 175   Net -575 -155          PHYSICAL EXAMINATION: General:  Pt off NIMV, no distress Neuro:  Awake, cooperative HEENT:  [place]/AT, BiPAP Cardiovascular:  s1 s2 RRR Lungs:  No distress , coarse Abdomen:  +BS, soft, NT/ND Musculoskeletal:   Skin:  warm, dry, b/l LE trace edema   LABS:  CBC  Recent Labs Lab 12/31/13 1221  WBC 11.0*  HGB 14.0  HCT 44.6  PLT 152   Coag's  Recent Labs Lab 12/31/13 1720  APTT 43*  INR 1.41   BMET  Recent Labs Lab 12/31/13 1221 01/01/14 0318  NA 142 140  K 4.5 4.1  CL 104  102  CO2 23 22  BUN 13 23  CREATININE 0.88 1.36*  GLUCOSE 150* 215*   Electrolytes  Recent Labs Lab 12/31/13 1221 12/31/13 1720 01/01/14 0318  CALCIUM 9.2  --  9.2  MG  --  1.7  --   PHOS  --  4.1  --    Sepsis Markers  Recent Labs Lab 12/31/13 1720  LATICACIDVEN 2.1  PROCALCITON 0.15   ABG  Recent Labs Lab 12/31/13 1619  PHART 7.303*  PCO2ART 47.9*  PO2ART 82.0   Liver Enzymes  Recent Labs Lab 12/31/13 1221  AST 18  ALT 18  ALKPHOS 75  BILITOT 0.8  ALBUMIN 3.7   Cardiac Enzymes  Recent Labs Lab 12/31/13 1221 12/31/13 1720 12/31/13 2236 01/01/14 0317  TROPONINI  --  <0.30 <0.30 <0.30  PROBNP 3105.0*  --   --   --    Glucose  Recent Labs Lab 01/01/14 0003 01/01/14 0346  GLUCAP 246* 198*    Imaging Dg Chest Port 1 View  12/31/2013   CLINICAL DATA:  Shortness of breath  EXAM: PORTABLE CHEST - 1 VIEW  COMPARISON:  DG CHEST 2 VIEW dated 11/08/2013  FINDINGS: Left-sided pacer in place.  Mild cardiomegaly persists without evidence for edema. Emphysematous changes are reidentified with scarring at the lung bases. Trace right pleural fluid or thickening reidentified. No focal parenchymal opacification. No acute osseous abnormality.  IMPRESSION: Emphysematous changes are reidentified without new focal acute finding.  Trace right pleural fluid or thickening.   Electronically Signed   By: Christiana PellantGretchen  Green M.D.   On: 12/31/2013 13:00   PULMONARY A: Acute on chronic respiratory failure secondary to AECOPD vs. CHF (07/09/13 LVEF 35-40%) Concern PNA rt  P:   BiPAP Duonebs q4h Albuterol q2h PRN Solumedrol 60mg  q12h, reduce in am if continu to improve Continuous pulse oximetry Hold home meds Dc NIMV, no further distress No further abg  CARDIOVASCULAR A:  Possible combined diastolic/systolic CHF exacerbation - less impressed for this Permanent atrial fibrillation with HR controlled on pradaxa  H/o Vtach, permanent pacemaker  CAD s/p stenting Chest  pain HTN H/o AAA P:  Telemetry, cosnider dc Morphine 2-4mg  q2h PRN pain NTG paste q6h PRN  Continue home digoxin 0.125mg  IV qd, level reviewed Hold home amiodarone, diltiazem, imdur, crestor, bisoprolol for now Check digoxin level - 1.1  RENAL A:   BPH ARF R/o over diuresis P:   holding home tamsulosin, finasteride Monitor BMP in am without lasix Allow pos balance, especially with rul infiltrate blossoming  GASTROINTESTINAL A:   Nutrition GI px  P:   Advance diet further GI px not indicated  HEMATOLOGIC A:   VTE px Chronic anticoagulation on home pradaxa  P:  d/c pradaxa, restart with resolution crt VTE px: Lovenox (dose reduction crt), SCDs Monitor CBC   INFECTIOUS A:   Leukocytosis, on chronic steroids Developing Rt PNA? P:   PCT noted, contiune abc given clinical circumstance CXR in AM Micro and abx as above, but if worsen of spike, add ceftaz, vanc, given res to levo risk pseudo  ENDOCRINE A:   DM II secondary to chronic steroids P:   CBG monitoring Lantus 10 Units qd Diet  Carb mod  NEUROLOGIC A:  Acute encephalopathy  P:   Improved, treat rsp failure  Code: DNR   To med floor  Mcarthur Rossettianiel J. Tyson AliasFeinstein, MD, FACP Pgr: [phone] [facility]

## 2014-01-01 NOTE — Progress Notes (Signed)
Pt. Admitted to [facility] from 1M.  Pt. A&OX4. VSS.  Daughter at bedside.  Pt. Oriented to room and unit.  Will continue to monitor. Vanice Sarahaylor L Thompson

## 2014-01-02 ENCOUNTER — Inpatient Hospital Stay (HOSPITAL_COMMUNITY): Payer: Medicare Other

## 2014-01-02 DIAGNOSIS — I509 Heart failure, unspecified: Secondary | ICD-10-CM

## 2014-01-02 DIAGNOSIS — J189 Pneumonia, unspecified organism: Secondary | ICD-10-CM

## 2014-01-02 DIAGNOSIS — I5042 Chronic combined systolic (congestive) and diastolic (congestive) heart failure: Secondary | ICD-10-CM

## 2014-01-02 DIAGNOSIS — Z950 Presence of cardiac pacemaker: Secondary | ICD-10-CM

## 2014-01-02 LAB — CBC WITH DIFFERENTIAL/PLATELET
BASOS ABS: 0 10*3/uL (ref 0.0–0.1)
Basophils Relative: 0 % (ref 0–1)
EOS PCT: 0 % (ref 0–5)
Eosinophils Absolute: 0 10*3/uL (ref 0.0–0.7)
HEMATOCRIT: 40.8 % (ref 39.0–52.0)
HEMOGLOBIN: 12.9 g/dL — AB (ref 13.0–17.0)
LYMPHS PCT: 2 % — AB (ref 12–46)
Lymphs Abs: 0.3 10*3/uL — ABNORMAL LOW (ref 0.7–4.0)
MCH: 30.2 pg (ref 26.0–34.0)
MCHC: 31.6 g/dL (ref 30.0–36.0)
MCV: 95.6 fL (ref 78.0–100.0)
MONO ABS: 0.9 10*3/uL (ref 0.1–1.0)
MONOS PCT: 6 % (ref 3–12)
Neutro Abs: 14.1 10*3/uL — ABNORMAL HIGH (ref 1.7–7.7)
Neutrophils Relative %: 92 % — ABNORMAL HIGH (ref 43–77)
Platelets: 138 10*3/uL — ABNORMAL LOW (ref 150–400)
RBC: 4.27 MIL/uL (ref 4.22–5.81)
RDW: 14.5 % (ref 11.5–15.5)
WBC: 15.3 10*3/uL — AB (ref 4.0–10.5)

## 2014-01-02 LAB — BASIC METABOLIC PANEL
BUN: 39 mg/dL — ABNORMAL HIGH (ref 6–23)
CHLORIDE: 99 meq/L (ref 96–112)
CO2: 27 mEq/L (ref 19–32)
CREATININE: 1.59 mg/dL — AB (ref 0.50–1.35)
Calcium: 8.9 mg/dL (ref 8.4–10.5)
GFR calc Af Amer: 45 mL/min — ABNORMAL LOW (ref >90)
GFR calc non Af Amer: 38 mL/min — ABNORMAL LOW (ref >90)
Glucose, Bld: 125 mg/dL — ABNORMAL HIGH (ref 70–99)
POTASSIUM: 3.9 meq/L (ref 3.7–5.3)
Sodium: 140 mEq/L (ref 137–147)

## 2014-01-02 LAB — GLUCOSE, CAPILLARY
GLUCOSE-CAPILLARY: 131 mg/dL — AB (ref 70–99)
GLUCOSE-CAPILLARY: 159 mg/dL — AB (ref 70–99)
GLUCOSE-CAPILLARY: 176 mg/dL — AB (ref 70–99)
GLUCOSE-CAPILLARY: 199 mg/dL — AB (ref 70–99)
GLUCOSE-CAPILLARY: 205 mg/dL — AB (ref 70–99)
GLUCOSE-CAPILLARY: 216 mg/dL — AB (ref 70–99)

## 2014-01-02 MED ORDER — PREDNISONE 20 MG PO TABS
40.0000 mg | ORAL_TABLET | Freq: Every day | ORAL | Status: DC
  Administered 2014-01-02 – 2014-01-04 (×3): 40 mg via ORAL
  Filled 2014-01-02 (×4): qty 2

## 2014-01-02 MED ORDER — LEVOFLOXACIN IN D5W 750 MG/150ML IV SOLN
750.0000 mg | INTRAVENOUS | Status: DC
  Administered 2014-01-03: 750 mg via INTRAVENOUS
  Filled 2014-01-02: qty 150

## 2014-01-02 MED ORDER — IPRATROPIUM-ALBUTEROL 0.5-2.5 (3) MG/3ML IN SOLN
3.0000 mL | Freq: Four times a day (QID) | RESPIRATORY_TRACT | Status: DC
  Administered 2014-01-02 – 2014-01-06 (×15): 3 mL via RESPIRATORY_TRACT
  Filled 2014-01-02 (×15): qty 3

## 2014-01-02 MED ORDER — ACETAMINOPHEN 325 MG PO TABS
650.0000 mg | ORAL_TABLET | Freq: Four times a day (QID) | ORAL | Status: DC | PRN
  Administered 2014-01-02 – 2014-01-04 (×4): 650 mg via ORAL
  Filled 2014-01-02 (×4): qty 2

## 2014-01-02 MED ORDER — INSULIN ASPART 100 UNIT/ML ~~LOC~~ SOLN
0.0000 [IU] | Freq: Three times a day (TID) | SUBCUTANEOUS | Status: DC
  Administered 2014-01-03: 5 [IU] via SUBCUTANEOUS
  Administered 2014-01-03 (×3): 3 [IU] via SUBCUTANEOUS
  Administered 2014-01-04: 2 [IU] via SUBCUTANEOUS
  Administered 2014-01-04: 3 [IU] via SUBCUTANEOUS
  Administered 2014-01-04 – 2014-01-05 (×2): 5 [IU] via SUBCUTANEOUS
  Administered 2014-01-05: 2 [IU] via SUBCUTANEOUS
  Administered 2014-01-05: 3 [IU] via SUBCUTANEOUS
  Administered 2014-01-06: 5 [IU] via SUBCUTANEOUS

## 2014-01-02 NOTE — Progress Notes (Signed)
PULMONARY / CRITICAL CARE MEDICINE   Name: [name] MRN: [id number] DOB: 1928/07/07    ADMISSION DATE:  12/31/2013 CONSULTATION DATE:  12/31/2013  REFERRING MD :  ED PRIMARY SERVICE: PCCM  CHIEF COMPLAINT:  Shortness of breath, weakness.   HISTORY OF PRESENT ILLNESS:  80 yo male with COPD (on chronic steroids, not on home O2, never intubated), CHF, Afib, CAD s/p stenting, HTN, and anxiety who presents to ED with worsening SOB.  LINES / TUBES: PIV 4/17>>  CULTURES: MRSA PCR 4/17>> NEG UA 4/17>>  ANTIBIOTICS: Levaquin 4/17>> Zosyn 4/17 x 1  Subjective: Denies pain or dyspnea. No requests  VITAL SIGNS: BP 106/66  Pulse 109  Temp(Src) 97.3 F (36.3 C) (Oral)  Resp 20  Ht 5' 8.9" (1.75 m)  Wt 177 lb 14.6 oz (80.7 kg)  BMI 26.35 kg/m2  SpO2 96%   HEMODYNAMICS:   VENTILATOR SETTINGS: Vent Mode:  [-] BIPAP FiO2 (%):  [35 %] 35 % Set Rate:  [15 bmp] 15 bmp PEEP:  [5 cmH20] 5 cmH20  INTAKE / OUTPUT: Intake/Output     04/17 0701 - 04/18 0700 04/18 0701 - 04/19 0700   P.O. 480    I.V. (mL/kg) 110 (1.4) 20 (0.2)   IV Piggyback 150    Total Intake(mL/kg) 740 (9.2) 20 (0.2)   Urine (mL/kg/hr) 1315 175 (1.2)   Total Output 1315 175   Net -575 -155          PHYSICAL EXAMINATION: General:  Pt getting neb. CXR just now done. Sat 100% on nebulizer Neuro:  Awake, cooperative, chatty, well oriented HEENT:  [place]/AT, nasal prongs Cardiovascular:  s1 s2 RRR Lungs:  No distress , unlabored, full sentences, no wheeze or cough Abdomen:  +BS, soft, NT/ND Musculoskeletal:  Moves all extremities Skin:  warm, dry, b/l LE trace edema   LABS:  CBC     Recent Labs Lab 12/31/13 1221  HGB 14.0  HCT 44.6  WBC 11.0*  PLT 152   Coag's  Recent Labs Lab 12/31/13 1720  APTT 43*  INR 1.41   BMET  Recent Labs Lab 12/31/13 1221 01/01/14 0318  NA 142 140  K 4.5 4.1  CL 104 102  CO2 23 22  BUN 13 23  CREATININE 0.88 1.36*  GLUCOSE 150* 215*    Electrolytes  Recent Labs Lab 12/31/13 1221 12/31/13 1720 01/01/14 0318  CALCIUM 9.2  --  9.2  MG  --  1.7  --   PHOS  --  4.1  --    Sepsis Markers  Recent Labs Lab 12/31/13 1720  LATICACIDVEN 2.1  PROCALCITON 0.15   ABG  Recent Labs Lab 12/31/13 1619  PHART 7.303*  PCO2ART 47.9*  PO2ART 82.0   Liver Enzymes  Recent Labs Lab 12/31/13 1221  AST 18  ALT 18  ALKPHOS 75  BILITOT 0.8  ALBUMIN 3.7   Cardiac Enzymes  Recent Labs Lab 12/31/13 1221 12/31/13 1720 12/31/13 2236 01/01/14 0317  TROPONINI  --  <0.30 <0.30 <0.30  PROBNP 3105.0*  --   --   --    Glucose  Recent Labs Lab 01/01/14 1720 01/01/14 2039 01/01/14 2335 01/02/14 0345 01/02/14 0812  GLUCAP 154* 193* 199* 216* 131*    Imaging   Dg Chest Port 1 View  12/31/2013   CLINICAL DATA:  Shortness of breath  EXAM: PORTABLE CHEST - 1 VIEW  COMPARISON:  DG CHEST 2 VIEW dated 11/08/2013  FINDINGS: Left-sided pacer in place. Mild cardiomegaly persists without  evidence for edema. Emphysematous changes are reidentified with scarring at the lung bases. Trace right pleural fluid or thickening reidentified. No focal parenchymal opacification. No acute osseous abnormality.  IMPRESSION: Emphysematous changes are reidentified without new focal acute finding.  Trace right pleural fluid or thickening.   Electronically Signed   By: Christiana PellantGretchen  Green M.D.   On: 12/31/2013 13:00   CXR 4/19 just done-pending  PULMONARY A: Acute on chronic respiratory failure secondary to AECOPD vs. CHF (07/09/13 LVEF 35-40%) Concern PNA rt  P:   BiPAP- not used overnight 4/18 Duonebs q4h Albuterol q2h PRN Solumedrol 60mg  q12h, reduce in am if continu to improve> pred 4/19 Continuous pulse oximetry Hold home meds Dc NIMV, no further distress No further abg  CARDIOVASCULAR A:  Possible combined diastolic/systolic CHF exacerbation - less impressed for this Permanent atrial fibrillation with HR controlled on pradaxa  H/o  Vtach, permanent pacemaker  CAD s/p stenting Chest pain- denies 4/19 HTN H/o AAA P:  Telemetry, consider dc Morphine 2-4mg  q2h PRN pain NTG paste q6h PRN  Continue home digoxin 0.125mg  IV qd, level reviewed Hold home amiodarone, diltiazem, imdur, crestor, bisoprolol for now Check digoxin level - 1.1  RENAL A:   BPH ARF R/o over diuresis P:   holding home tamsulosin, finasteride Monitor BMP in am 4/20 without lasix  GASTROINTESTINAL A:   Nutrition GI px  P:   Advance diet further GI px not indicated  HEMATOLOGIC A:   VTE px Chronic anticoagulation on home pradaxa  P:  d/c pradaxa, restart with resolution crt VTE px: Lovenox (dose reduction crt), SCDs Monitor CBC   INFECTIOUS A:   Leukocytosis, on chronic steroids Developing Rt PNA? P:   PCT noted, contiune abc given clinical circumstance CXR 4/19 done-pending Micro and abx as above, but if worsen of spike, add ceftaz, vanc, given res to levo risk pseudo  ENDOCRINE A:   DM II secondary to chronic steroids P:   CBG monitoring Lantus 10 Units qd Diet  Carb mod  NEUROLOGIC A:  Acute encephalopathy  P:   Improved, treat rsp failure  Code: DNR   CD Cherylyn Sundby, MD, FACP Pgr: [phone]  Mobile [phone] [facility]

## 2014-01-03 LAB — BASIC METABOLIC PANEL
BUN: 41 mg/dL — ABNORMAL HIGH (ref 6–23)
CALCIUM: 9.3 mg/dL (ref 8.4–10.5)
CO2: 29 mEq/L (ref 19–32)
Chloride: 100 mEq/L (ref 96–112)
Creatinine, Ser: 1.32 mg/dL (ref 0.50–1.35)
GFR, EST AFRICAN AMERICAN: 56 mL/min — AB (ref >90)
GFR, EST NON AFRICAN AMERICAN: 48 mL/min — AB (ref >90)
Glucose, Bld: 188 mg/dL — ABNORMAL HIGH (ref 70–99)
Potassium: 4.4 mEq/L (ref 3.7–5.3)
SODIUM: 141 meq/L (ref 137–147)

## 2014-01-03 LAB — GLUCOSE, CAPILLARY
GLUCOSE-CAPILLARY: 180 mg/dL — AB (ref 70–99)
Glucose-Capillary: 162 mg/dL — ABNORMAL HIGH (ref 70–99)
Glucose-Capillary: 166 mg/dL — ABNORMAL HIGH (ref 70–99)
Glucose-Capillary: 228 mg/dL — ABNORMAL HIGH (ref 70–99)

## 2014-01-03 LAB — CBC
HCT: 43.8 % (ref 39.0–52.0)
Hemoglobin: 14 g/dL (ref 13.0–17.0)
MCH: 30.6 pg (ref 26.0–34.0)
MCHC: 32 g/dL (ref 30.0–36.0)
MCV: 95.8 fL (ref 78.0–100.0)
Platelets: 146 10*3/uL — ABNORMAL LOW (ref 150–400)
RBC: 4.57 MIL/uL (ref 4.22–5.81)
RDW: 14.3 % (ref 11.5–15.5)
WBC: 12.6 10*3/uL — ABNORMAL HIGH (ref 4.0–10.5)

## 2014-01-03 MED ORDER — ISOSORBIDE MONONITRATE ER 60 MG PO TB24
60.0000 mg | ORAL_TABLET | Freq: Every day | ORAL | Status: DC
  Administered 2014-01-03 – 2014-01-06 (×4): 60 mg via ORAL
  Filled 2014-01-03 (×4): qty 1

## 2014-01-03 MED ORDER — DABIGATRAN ETEXILATE MESYLATE 150 MG PO CAPS
150.0000 mg | ORAL_CAPSULE | Freq: Two times a day (BID) | ORAL | Status: DC
  Administered 2014-01-03 – 2014-01-06 (×6): 150 mg via ORAL
  Filled 2014-01-03 (×7): qty 1

## 2014-01-03 MED ORDER — ATORVASTATIN CALCIUM 80 MG PO TABS
80.0000 mg | ORAL_TABLET | Freq: Every day | ORAL | Status: DC
  Administered 2014-01-03 – 2014-01-05 (×3): 80 mg via ORAL
  Filled 2014-01-03 (×4): qty 1

## 2014-01-03 MED ORDER — AMIODARONE HCL 200 MG PO TABS
200.0000 mg | ORAL_TABLET | Freq: Every day | ORAL | Status: DC
  Administered 2014-01-03 – 2014-01-06 (×4): 200 mg via ORAL
  Filled 2014-01-03 (×4): qty 1

## 2014-01-03 NOTE — Progress Notes (Signed)
ANTICOAGULATION CONSULT NOTE - follow up Pharmacy Consult for Lovenox Indication: atrial fibrillation  No Known Allergies  Patient Measurements: Height: 5' 8.9" (175 cm) Weight: 170 lb 1.6 oz (77.157 kg) IBW/kg (Calculated) : 70.47 Heparin Dosing Weight: 79kg  Vital Signs: Temp: 97.5 F (36.4 C) (04/20 1411) Temp src: Oral (04/20 1411) BP: 130/70 mmHg (04/20 1411) Pulse Rate: 78 (04/20 1411)  Labs:  Recent Labs  12/31/13 1720 12/31/13 2236 01/01/14 0317 01/01/14 0318 01/02/14 0735 01/03/14 1319  HGB  --   --   --   --  12.9* 14.0  HCT  --   --   --   --  40.8 43.8  PLT  --   --   --   --  138* 146*  APTT 43*  --   --   --   --   --   LABPROT 16.9*  --   --   --   --   --   INR 1.41  --   --   --   --   --   CREATININE  --   --   --  1.36* 1.59* 1.32  TROPONINI <0.30 <0.30 <0.30  --   --   --     Estimated Creatinine Clearance: 42.3 ml/min (by C-G formula based on Cr of 1.32).    Assessment: 80 year old man on Pradaxa prior to admission for afib who is unable to take oral medications.  Started on LMWH on 4/17.  Hg stable at 14. pltc stable at 146.  No bleeding reported.  Creat 0.88>1.36>1.59>1.32.      Goal of Therapy:  Anti-Xa level 0.6-1 units/ml 4hrs after LMWH dose given Monitor platelets by anticoagulation protocol: Yes   Plan:  1. Continue Lovenox 80mg  SQ q12 2. Monitor renal function, CBC, ability to resume Pradaxa, and for any bleeding complications Consider resuming pradaxa if appropriate. Herby AbrahamMichelle T. Temari Schooler, Pharm.D. [phone] 01/03/2014 2:30 PM

## 2014-01-03 NOTE — Progress Notes (Signed)
PULMONARY / CRITICAL CARE MEDICINE   Name: Darin AntonJoe B [name] MRN: [id number] DOB: [birth year]    ADMISSION DATE:  12/31/2013 CONSULTATION DATE:  12/31/2013  REFERRING MD :  ED PRIMARY SERVICE: PCCM  CHIEF COMPLAINT:  Shortness of breath, weakness.   HISTORY OF PRESENT ILLNESS:  80 y/o male with COPD (on chronic steroids, not on home O2, never intubated), CHF, Afib, CAD s/p stenting, HTN, and anxiety who presents to ED with worsening SOB.  LINES / TUBES: PIV 4/17>>  CULTURES: MRSA PCR 4/17>> NEG UA 4/17>>neg  ANTIBIOTICS: Levaquin 4/17>> Zosyn 4/17 x 1  Subjective: pt reports sob worse than yesterday, sputum production has decreased. Continues on 3L  VITAL SIGNS: BP 143/69  Pulse 103  Temp(Src) 97.7 F (36.5 C) (Oral)  Resp 19  Ht 5' 8.9" (1.75 m)  Wt 177 lb 14.6 oz (80.7 kg)  BMI 26.35 kg/m2  SpO2 99%   INTAKE / OUTPUT: I/O last 3 completed shifts: In: 450 [P.O.:360; Other:90] Out: 2250 [Urine:2250]     PHYSICAL EXAMINATION: General:  wdwn elderly male in NAD Neuro:  Awake, cooperative, chatty, well oriented HEENT:  [place]/AT, [place] 3L Cardiovascular:  s1 s2 RRR Lungs:  No distress , unlabored, full sentences, scattered wheezes bilaterally  Abdomen:  +BS, soft, NT/ND Musculoskeletal:  Moves all extremities Skin:  warm, dry, b/l LE trace edema   LABS:  CBC     Recent Labs Lab 12/31/13 1221 01/02/14 0735  HGB 14.0 12.9*  HCT 44.6 40.8  WBC 11.0* 15.3*  PLT 152 138*    Recent Labs Lab 12/31/13 1221 12/31/13 1720 01/01/14 0318 01/02/14 0735  NA 142  --  140 140  K 4.5  --  4.1 3.9  CL 104  --  102 99  CO2 23  --  22 27  GLUCOSE 150*  --  215* 125*  BUN 13  --  23 39*  CREATININE 0.88  --  1.36* 1.59*  CALCIUM 9.2  --  9.2 8.9  MG  --  1.7  --   --   PHOS  --  4.1  --   --     Imaging    CXR 4/19 >> RM & LL mild airspace disease  PULMONARY A: Acute on chronic respiratory failure secondary to AECOPD vs. CHF (07/09/13 LVEF 35-40%) Concern R  PNA  P:   Duonebs q4h Albuterol q2h PRN IV steroids changed to pred 4/19 >> will need to wean to baseline of 5mg  QD PRN saturation checks Consider resume QVAR Mobilize, ambulate  CARDIOVASCULAR A:  Possible combined diastolic/systolic CHF exacerbation - less impressed for this Permanent atrial fibrillation with HR controlled on pradaxa  H/o Vtach, permanent pacemaker  CAD s/p stenting Chest pain - resolved.  HTN H/o AAA P:  Telemetry, consider dc Morphine 2-4mg  q2h PRN pain NTG paste q6h PRN  Continue home digoxin 0.125mg  IV qd, level reviewed (1.1) Hold home bisoprolol, diltiazem for now - consider restart in am 4/21 if tolerates imdur, amio etc as below Restart amiodarone, imdur, crestor  RENAL A:   BPH ARF R/o over diuresis P:   holding home tamsulosin, finasteride Monitor BMP > rising cr despite lasix being held  GASTROINTESTINAL A:   Nutrition GI px  P:   Diet as tolerated GI px not indicated  HEMATOLOGIC A:   VTE px Chronic anticoagulation on home pradaxa  P:  d/c pradaxa, restart with resolution crt VTE px: Lovenox (dose reduction crt), SCDs Monitor CBC   INFECTIOUS A:  Leukocytosis, on chronic steroids Developing Rt PNA? 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 Pt feels weak and has made statements that he does not feel comfortable returning home.    Canary BrimBrandi Ollis, NP-C [facility] Pgr: [phone] or [phone]   STAFF NOTE: I, Dr Lavinia SharpsM Astryd Pearcy have personally reviewed patient's available data, including medical history, events of note, physical  examination and test results as part of my evaluation. I have discussed with resident/NP and other care providers such as pharmacist, RN and RRT.  In addition,  I personally evaluated patient and elicited key findings of recurent AECOPD with chronic resp failure. Slow to resolve. Rest per NP.  Rest per NP/medical resident whose note is outlined above and that I agree with    Dr. Kalman ShanMurali Dornell Grasmick, M.D., [facility].C.P Pulmonary and Critical Care Medicine Staff Physician [facility]  [facility] Critical [facility] Pager: [phone], If no answer or between  15:00h - 7:00h: call [phone]  01/03/2014 12:16 PM

## 2014-01-04 ENCOUNTER — Inpatient Hospital Stay (HOSPITAL_COMMUNITY): Payer: Medicare Other

## 2014-01-04 LAB — BASIC METABOLIC PANEL
BUN: 40 mg/dL — ABNORMAL HIGH (ref 6–23)
CALCIUM: 9 mg/dL (ref 8.4–10.5)
CO2: 28 mEq/L (ref 19–32)
CREATININE: 1.35 mg/dL (ref 0.50–1.35)
Chloride: 102 mEq/L (ref 96–112)
GFR calc Af Amer: 54 mL/min — ABNORMAL LOW (ref >90)
GFR, EST NON AFRICAN AMERICAN: 47 mL/min — AB (ref >90)
Glucose, Bld: 103 mg/dL — ABNORMAL HIGH (ref 70–99)
Potassium: 3.9 mEq/L (ref 3.7–5.3)
SODIUM: 141 meq/L (ref 137–147)

## 2014-01-04 LAB — GLUCOSE, CAPILLARY
GLUCOSE-CAPILLARY: 135 mg/dL — AB (ref 70–99)
Glucose-Capillary: 77 mg/dL (ref 70–99)
Glucose-Capillary: 226 mg/dL — ABNORMAL HIGH (ref 70–99)
Glucose-Capillary: 186 mg/dL — ABNORMAL HIGH (ref 70–99)

## 2014-01-04 LAB — MAGNESIUM: MAGNESIUM: 2.2 mg/dL (ref 1.5–2.5)

## 2014-01-04 LAB — PHOSPHORUS: PHOSPHORUS: 3.2 mg/dL (ref 2.3–4.6)

## 2014-01-04 MED ORDER — DIGOXIN 250 MCG PO TABS
0.2500 mg | ORAL_TABLET | Freq: Every day | ORAL | Status: DC
  Administered 2014-01-04 – 2014-01-06 (×3): 0.25 mg via ORAL
  Filled 2014-01-04 (×4): qty 1

## 2014-01-04 MED ORDER — LEVOFLOXACIN 500 MG PO TABS: 500.0000 mg | ORAL_TABLET | Freq: Every day | ORAL | Status: DC

## 2014-01-04 MED ORDER — SODIUM CHLORIDE 0.9 % IV SOLN
250.0000 mL | INTRAVENOUS | Status: DC | PRN
Start: 2014-01-04 — End: 2014-01-05

## 2014-01-04 MED ORDER — MORPHINE SULFATE 10 MG/5ML PO SOLN
4.0000 mg | Freq: Once | ORAL | Status: AC
  Administered 2014-01-04: 4 mg via ORAL
  Filled 2014-01-04: qty 5

## 2014-01-04 MED ORDER — LEVOFLOXACIN 750 MG PO TABS
750.0000 mg | ORAL_TABLET | ORAL | Status: AC
  Administered 2014-01-04 – 2014-01-06 (×2): 750 mg via ORAL
  Filled 2014-01-04 (×2): qty 1

## 2014-01-04 MED ORDER — PREDNISONE 20 MG PO TABS
30.0000 mg | ORAL_TABLET | Freq: Every day | ORAL | Status: DC
  Administered 2014-01-05 – 2014-01-06 (×2): 30 mg via ORAL
  Filled 2014-01-04 (×2): qty 1

## 2014-01-04 MED ORDER — SENNOSIDES-DOCUSATE SODIUM 8.6-50 MG PO TABS
2.0000 | ORAL_TABLET | ORAL | Status: AC
  Administered 2014-01-04: 2 via ORAL
  Filled 2014-01-04: qty 2

## 2014-01-04 MED ORDER — BECLOMETHASONE DIPROPIONATE 80 MCG/ACT IN AERS
1.0000 | INHALATION_SPRAY | Freq: Two times a day (BID) | RESPIRATORY_TRACT | Status: DC
  Administered 2014-01-04 – 2014-01-06 (×4): 1 via RESPIRATORY_TRACT
  Filled 2014-01-04 (×2): qty 8.7

## 2014-01-04 MED ORDER — SENNOSIDES-DOCUSATE SODIUM 8.6-50 MG PO TABS
2.0000 | ORAL_TABLET | Freq: Every day | ORAL | Status: DC
  Administered 2014-01-05: 2 via ORAL
  Filled 2014-01-04: qty 2

## 2014-01-04 MED ORDER — TAMSULOSIN HCL 0.4 MG PO CAPS
0.4000 mg | ORAL_CAPSULE | Freq: Every day | ORAL | Status: DC
  Administered 2014-01-04 – 2014-01-06 (×3): 0.4 mg via ORAL
  Filled 2014-01-04 (×3): qty 1

## 2014-01-04 NOTE — Progress Notes (Signed)
PULMONARY / CRITICAL CARE MEDICINE   Name: Darin AntonJoe B Nickolas MRN: [id number] DOB: 1927-12-16    ADMISSION DATE:  12/31/2013 CONSULTATION DATE:  12/31/2013  REFERRING MD :  ED PRIMARY SERVICE: PCCM  CHIEF COMPLAINT:  Shortness of breath, weakness.   HISTORY OF PRESENT ILLNESS:  80 y/o male with COPD (on chronic steroids, not on home O2, never intubated), CHF, Afib, CAD s/p stenting, HTN, and anxiety who presents to ED with worsening SOB.  LINES / TUBES: PIV 4/17>>  CULTURES: MRSA PCR 4/17>> NEG UA 4/17>>neg  ANTIBIOTICS: Levaquin 4/17>> Zosyn 4/17 x 1  Subjective: SOB unchanged. On 2 l [place] with O2 sats 100%. He has not had PT evaluation yet(it was ordered 4/20) VITAL SIGNS: BP 150/90  Pulse 96  Temp(Src) 97.5 F (36.4 C) (Oral)  Resp 20  Ht 5' 8.9" (1.75 m)  Wt 74.753 kg (164 lb 12.8 oz)  BMI 24.41 kg/m2  SpO2 100%   INTAKE / OUTPUT: I/O last 3 completed shifts: In: 360 [P.O.:360] Out: 3500 [Urine:3500]     PHYSICAL EXAMINATION: General:  wdwn elderly male in NAD, in bed " I haven't been out of bed yet" Neuro:  Awake, cooperative, chatty, well oriented HEENT:  [place]/AT, [place] 2L Cardiovascular:  s1 s2 RRR Lungs:  No distress , unlabored, full sentences, diminished  bs Abdomen:  +BS, soft, NT/ND Musculoskeletal:  Moves all extremities Skin:  warm, dry, b/l LE trace edema   LABS:  CBC     Recent Labs Lab 12/31/13 1221 01/02/14 0735 01/03/14 1319  HGB 14.0 12.9* 14.0  HCT 44.6 40.8 43.8  WBC 11.0* 15.3* 12.6*  PLT 152 138* 146*    Recent Labs Lab 12/31/13 1221 12/31/13 1720 01/01/14 0318 01/02/14 0735 01/03/14 1319 01/04/14 0432  NA 142  --  140 140 141 141  K 4.5  --  4.1 3.9 4.4 3.9  CL 104  --  102 99 100 102  CO2 23  --  22 27 29 28   GLUCOSE 150*  --  215* 125* 188* 103*  BUN 13  --  23 39* 41* 40*  CREATININE 0.88  --  1.36* 1.59* 1.32 1.35  CALCIUM 9.2  --  9.2 8.9 9.3 9.0  MG  --  1.7  --   --   --  2.2  PHOS  --  4.1  --   --   --  3.2     Imaging    Dg Chest Port 1 View  01/04/2014   CLINICAL DATA:  Cough.  Shortness of breath.  EXAM: PORTABLE CHEST - 1 VIEW  COMPARISON:  DG CHEST 1V PORT dated 01/02/2014  FINDINGS: Stable enlarged cardiac and mediastinal contours. Calcification of the thoracic aorta. Interval improvement in right mid lung heterogeneous opacities. Unchanged blunting of the right costophrenic angle. Re- demonstrated single lead pacer apparatus, tip stable in position.  IMPRESSION: Interval improvement right mid lung heterogeneous opacities.  Small right pleural effusion.   Electronically Signed   By: Annia Beltrew  Davis M.D.   On: 01/04/2014 07:24     PULMONARY A: Acute on chronic respiratory failure secondary to AECOPD vs. CHF (07/09/13 LVEF 35-40%) Concern R PNA  P:   Duonebs q4h Albuterol q2h PRN IV steroids changed to pred 4/19 >> will need to wean to baseline of 5mg  QD PRN saturation checks, wean O2 as tolerated Consider resume QVAR Mobilize, ambulate, PT evaluation pending 4/21 0800  CARDIOVASCULAR A:  Possible combined diastolic/systolic CHF exacerbation - less impressed for  this Permanent atrial fibrillation with HR controlled with amio, dig,  On pradaxa for anticoagulation H/o Vtach, permanent pacemaker  CAD s/p stenting Chest pain - resolved.  HTN H/o AAA P:  Telemetry Morphine dc NTG paste q6h PRN  Continue home digoxin 0.125mg  IV qd, level reviewed (1.1), 4/21 change to po Restart home bisoprolol, diltiazem for now   Restarted amiodarone, imdur, crestor 4/20  RENAL Lab Results  Component Value Date   CREATININE 1.35 01/04/2014   CREATININE 1.32 01/03/2014   CREATININE 1.59* 01/02/2014    A:   BPH ARF R/o over diuresis P:   holding home tamsulosin, finasteride, lasix Monitor BMP > creatine decreasing 4/21  GASTROINTESTINAL A:   Nutrition GI px  P:   Diet as tolerated GI px not indicated  HEMATOLOGIC A:   VTE px Chronic anticoagulation on home pradaxa  P:  d/c  pradaxa, restarted with resolution crt   INFECTIOUS A:   Leukocytosis, on chronic steroids Developing Rt PNA? 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 [facility] Goal to home with PT vs full SNF Wean O 2 as tolerated(not on at home). If not weaned will need home O2..  Change abx to po. Resume home meds but hold lasix, zibeta and dilt for now. May resume in am 4/22 Follow labs.  Brett CanalesSteve Minor ACNP [facility] Pager [phone] till 3 pm If no answer page [phone] 01/04/2014, 8:19 AM  Staff note  STAFF NOTE: I, Dr Lavinia SharpsM Consuelo Thayne have personally reviewed patient's available data, including medical history, events of note, physical examination and test results as part of my evaluation. I have discussed with resident/NP and other care providers such as pharmacist, RN and RRT.  In addition,  I personally evaluated patient and elicited key findings of acute on chronic resp failure due to aecopd. Recurrent exacerbator. Discussed ways to prevent with him and daughter: Daughter is advising him to be pro-active about calling office for AECOPD promptly. Daughter also wants him on low dose oral morphine for dyspnea relief. We discussed side effects and he is willing. Will give test x 1 now and reasses.  Rest per NP/medical resident whose note is outlined above and that I agree with   Dr. Kalman ShanMurali Rogue Rafalski, M.D., [facility] LL[facility].C.C.P Pulmonary and Critical Care Medicine Staff Physician [facility] Pager: [phone], If no answer or  between  15:00h - 7:00h: call [phone]  01/04/2014 12:30 PM

## 2014-01-04 NOTE — Progress Notes (Signed)
O2 sat noted to be 100% on 3 liters, on ambulation sat dropped to 81-83% on room air  Patient back to bed and replaced 3 liters, immediately came back up to 92% then after approximately 1 minute back up to 100%.

## 2014-01-04 NOTE — Progress Notes (Signed)
eLink Physician-Brief Progress Note Patient Name: [name] DOB: 02/13/1928 MRN: [id number]  Date of Service  01/04/2014   HPI/Events of Note     eICU Interventions  Home dose of Senokot resumed   Intervention Category Minor Interventions: Routine modifications to care plan (e.g. PRN medications for pain, fever)  Merwyn KatosDavid B Simonds 01/04/2014, 5:11 PM

## 2014-01-04 NOTE — Progress Notes (Signed)
O2 sat 89% on 3 liters while ambulating with PT.  RN will reassess off O2 after lunch.

## 2014-01-04 NOTE — Evaluation (Signed)
Physical Therapy Evaluation Patient Details Name: [name] MRN: [id number] DOB: 01-26-1928 Today's Date: 01/04/2014   History of Present Illness  80 y/o male with COPD (on chronic steroids, not on home O2, never intubated), CHF, Afib, CAD s/p stenting, HTN, and anxiety who presents to ED with worsening SOB.  Clinical Impression  Patient demonstrates deficit in mobility as indicated below. Will benefit from continued skilled PT to address deficits and maximize function. Will see as indicated and progress as tolerated. Recommend return to ALF with PT services upon discharge.    Follow Up Recommendations Home health PT (if patient does not progress may need ST SNF)    Equipment Recommendations  None recommended by PT (patient has equipment)    Recommendations for Other Services       Precautions / Restrictions Restrictions Weight Bearing Restrictions: No      Mobility  Bed Mobility Overal bed mobility: Modified Independent                Transfers Overall transfer level: Needs assistance Equipment used: Rolling walker (2 wheeled) Transfers: Sit to/from Stand Sit to Stand: Supervision         General transfer comment: no physical assist needed  Ambulation/Gait Ambulation/Gait assistance: Min guard Ambulation Distance (Feet): 90 Feet (x2 on 3 liters O2) Assistive device: Rolling walker (2 wheeled) Gait Pattern/deviations: Decreased stride length;Trunk flexed;Narrow base of support Gait velocity: decreased Gait velocity interpretation: Below normal speed for age/gender General Gait Details: Steady with ambulation, DOE 3/4 2 seated rest breaks  Stairs            Wheelchair Mobility    Modified Rankin (Stroke Patients Only)       Balance                                             Pertinent Vitals/Pain Mild low back pain (pt reports this is chronic); SpO2 during ambulation dropped to 89% on 3 liters with HR 110s    Home  Living Family/patient expects to be discharged to:: Assisted living               Home Equipment: Walker - 2 wheels;Walker - 4 wheels;Shower seat;Grab bars - toilet;Grab bars - tub/shower      Prior Function Level of Independence: Independent with assistive device(s)               Hand Dominance   Dominant Hand: Right    Extremity/Trunk Assessment   Upper Extremity Assessment: RUE deficits/detail;LUE deficits/detail     RUE Sensation: history of peripheral neuropathy     Lower Extremity Assessment: Generalized weakness         Communication   Communication: HOH  Cognition Arousal/Alertness: Awake/alert Behavior During Therapy: WFL for tasks assessed/performed Overall Cognitive Status: Within Functional Limits for tasks assessed                      General Comments General comments (skin integrity, edema, etc.): ambulated on 3 liters O2, HR elevated to 110s with ambulation. SpO2 decreased to 89%, increased rest to rebound    Exercises        Assessment/Plan    PT Assessment Patient needs continued PT services  PT Diagnosis Difficulty walking;Generalized weakness   PT Problem List Decreased strength;Decreased activity tolerance;Decreased mobility;Cardiopulmonary status limiting activity  PT Treatment Interventions DME instruction;Gait training;Functional  mobility training;Therapeutic activities;Therapeutic exercise;Balance training;Patient/family education   PT Goals (Current goals can be found in the Care Plan section) Acute Rehab PT Goals Patient Stated Goal: to go back to his AL Apartment PT Goal Formulation: With patient Time For Goal Achievement: 01/18/14 Potential to Achieve Goals: Good    Frequency Min 4X/week   Barriers to discharge        Co-evaluation               End of Session Equipment Utilized During Treatment: Gait belt;Oxygen Activity Tolerance: Patient tolerated treatment well;Patient limited by  fatigue Patient left: in chair;with call bell/phone within reach Nurse Communication: Mobility status         Time: [id number] PT Time Calculation (min): 25 min   Charges:   PT Evaluation $Initial PT Evaluation Tier I: 1 Procedure PT Treatments $Gait Training: 8-22 mins $Therapeutic Activity: 8-22 mins   PT G Codes:          Fabio AsaDevon J Yash Cacciola 01/04/2014, 11:12 AM Charlotte Crumbevon Noraa Pickeral, PT DPT  [phone]

## 2014-01-05 LAB — BASIC METABOLIC PANEL
BUN: 36 mg/dL — AB (ref 6–23)
CHLORIDE: 102 meq/L (ref 96–112)
CO2: 27 mEq/L (ref 19–32)
Calcium: 9.1 mg/dL (ref 8.4–10.5)
Creatinine, Ser: 1.21 mg/dL (ref 0.50–1.35)
GFR calc Af Amer: 62 mL/min — ABNORMAL LOW (ref >90)
GFR, EST NON AFRICAN AMERICAN: 54 mL/min — AB (ref >90)
GLUCOSE: 108 mg/dL — AB (ref 70–99)
Potassium: 4.8 mEq/L (ref 3.7–5.3)
Sodium: 142 mEq/L (ref 137–147)

## 2014-01-05 LAB — MAGNESIUM: MAGNESIUM: 2.4 mg/dL (ref 1.5–2.5)

## 2014-01-05 LAB — CBC
HEMATOCRIT: 41.8 % (ref 39.0–52.0)
Hemoglobin: 13.1 g/dL (ref 13.0–17.0)
MCH: 30.3 pg (ref 26.0–34.0)
MCHC: 31.3 g/dL (ref 30.0–36.0)
MCV: 96.5 fL (ref 78.0–100.0)
Platelets: 136 10*3/uL — ABNORMAL LOW (ref 150–400)
RBC: 4.33 MIL/uL (ref 4.22–5.81)
RDW: 14.2 % (ref 11.5–15.5)
WBC: 8 10*3/uL (ref 4.0–10.5)

## 2014-01-05 LAB — GLUCOSE, CAPILLARY
Glucose-Capillary: 89 mg/dL (ref 70–99)
Glucose-Capillary: 144 mg/dL — ABNORMAL HIGH (ref 70–99)
Glucose-Capillary: 229 mg/dL — ABNORMAL HIGH (ref 70–99)
Glucose-Capillary: 176 mg/dL — ABNORMAL HIGH (ref 70–99)

## 2014-01-05 LAB — PHOSPHORUS: Phosphorus: 3.4 mg/dL (ref 2.3–4.6)

## 2014-01-05 MED ORDER — POTASSIUM CHLORIDE 20 MEQ/15ML (10%) PO LIQD
40.0000 meq | Freq: Once | ORAL | Status: DC
  Filled 2014-01-05: qty 30

## 2014-01-05 MED ORDER — FINASTERIDE 5 MG PO TABS
5.0000 mg | ORAL_TABLET | Freq: Every day | ORAL | Status: DC
  Administered 2014-01-05 – 2014-01-06 (×2): 5 mg via ORAL
  Filled 2014-01-05 (×2): qty 1

## 2014-01-05 MED ORDER — TAMSULOSIN HCL 0.4 MG PO CAPS: 0.4000 mg | ORAL_CAPSULE | Freq: Every day | ORAL | Status: DC

## 2014-01-05 MED ORDER — MORPHINE SULFATE 10 MG/5ML PO SOLN: 4.0000 mg | Freq: Two times a day (BID) | ORAL | Status: DC | PRN

## 2014-01-05 MED ORDER — FUROSEMIDE 20 MG PO TABS
20.0000 mg | ORAL_TABLET | Freq: Every day | ORAL | Status: DC
  Administered 2014-01-05 – 2014-01-06 (×2): 20 mg via ORAL
  Filled 2014-01-05 (×2): qty 1

## 2014-01-05 MED ORDER — POTASSIUM CHLORIDE CRYS ER 20 MEQ PO TBCR
40.0000 meq | EXTENDED_RELEASE_TABLET | Freq: Once | ORAL | Status: AC
  Administered 2014-01-05: 40 meq via ORAL
  Filled 2014-01-05: qty 2

## 2014-01-05 MED ORDER — FUROSEMIDE 10 MG/ML IJ SOLN: 20.0000 mg | Freq: Once | INTRAMUSCULAR | Status: DC

## 2014-01-05 NOTE — Progress Notes (Signed)
PULMONARY / CRITICAL CARE MEDICINE   Name: Darin AntonJoe B Marion MRN: [id number] DOB: 1928-01-26    ADMISSION DATE:  12/31/2013 CONSULTATION DATE:  12/31/2013  REFERRING MD :  ED PRIMARY SERVICE: PCCM  CHIEF COMPLAINT:  Shortness of breath, weakness.   HISTORY OF PRESENT ILLNESS:  80 y/o male with COPD (on chronic steroids, not on home O2, never intubated), CHF, Afib, CAD s/p stenting, HTN, and anxiety who presents to ED with worsening SOB.  LINES / TUBES: PIV 4/17>>  CULTURES: MRSA PCR 4/17>> NEG UA 4/17>>neg  ANTIBIOTICS: Levaquin 4/17>> Zosyn 4/17 x 1  Subjective:   01/04/14: SOB unchanged. On 2 l [place] with O2 sats 100%. He has not had PT evaluation yet(it was ordered 4/20)  01/05/14: feeling better. Needing 3L [place]; drops to 89% with ambulation which is ok. Morphine x 1 oral syrup helped dyspnea. He looks forward to assisted living dc but with possible PT at home  VITAL SIGNS: BP 144/78  Pulse 98  Temp(Src) 97.5 F (36.4 C) (Oral)  Resp 16  Ht 5' 8.9" (1.75 m)  Wt 75.07 kg (165 lb 8 oz)  BMI 24.51 kg/m2  SpO2 95%   INTAKE / OUTPUT: I/O last 3 completed shifts: In: 600 [P.O.:600] Out: 2975 [Urine:2975] Total I/O In: 240 [P.O.:240] Out: -    PHYSICAL EXAMINATION: General:  wdwn elderly male in NAD,sitting in chair. Looks better Neuro:  Awake, cooperative, chatty, well oriented HEENT:  [place]/AT,  2L Cardiovascular:  s1 s2 RRR Lungs:  No distress , unlabored, full sentences, diminished  bs Abdomen:  +BS, soft, NT/ND Musculoskeletal:  Moves all extremities Skin:  warm, dry, b/l LE trace edema   LABS:  PULMONARY  Recent Labs Lab 12/31/13 1619  PHART 7.303*  PCO2ART 47.9*  PO2ART 82.0  HCO3 23.7  TCO2 25  O2SAT 95.0    CBC  Recent Labs Lab 01/02/14 0735 01/03/14 1319 01/05/14 0400  HGB 12.9* 14.0 13.1  HCT 40.8 43.8 41.8  WBC 15.3* 12.6* 8.0  PLT 138* 146* 136*    COAGULATION  Recent Labs Lab 12/31/13 1720  INR 1.41    CARDIAC    Recent Labs Lab 12/31/13 1720 12/31/13 2236 01/01/14 0317  TROPONINI <0.30 <0.30 <0.30    Recent Labs Lab 12/31/13 1221  PROBNP 3105.0*     CHEMISTRY  Recent Labs Lab 12/31/13 1720 01/01/14 0318 01/02/14 0735 01/03/14 1319 01/04/14 0432 01/05/14 0400  NA  --  140 140 141 141 142  K  --  4.1 3.9 4.4 3.9 4.8  CL  --  102 99 100 102 102  CO2  --  22 27 29 28 27   GLUCOSE  --  215* 125* 188* 103* 108*  BUN  --  23 39* 41* 40* 36*  CREATININE  --  1.36* 1.59* 1.32 1.35 1.21  CALCIUM  --  9.2 8.9 9.3 9.0 9.1  MG 1.7  --   --   --  2.2 2.4  PHOS 4.1  --   --   --  3.2 3.4   Estimated Creatinine Clearance: 46.1 ml/min (by C-G formula based on Cr of 1.21).   LIVER  Recent Labs Lab 12/31/13 1221 12/31/13 1720  AST 18  --   ALT 18  --   ALKPHOS 75  --   BILITOT 0.8  --   PROT 6.5  --   ALBUMIN 3.7  --   INR  --  1.41     INFECTIOUS  Recent Labs Lab 12/31/13  1720  LATICACIDVEN 2.1  PROCALCITON 0.15     ENDOCRINE CBG (last 3)   Recent Labs  01/04/14 2134 01/05/14 0748 01/05/14 1222  GLUCAP 186* 89 144*         IMAGING x48h  Dg Chest Port 1 View  01/04/2014   CLINICAL DATA:  Cough.  Shortness of breath.  EXAM: PORTABLE CHEST - 1 VIEW  COMPARISON:  DG CHEST 1V PORT dated 01/02/2014  FINDINGS: Stable enlarged cardiac and mediastinal contours. Calcification of the thoracic aorta. Interval improvement in right mid lung heterogeneous opacities. Unchanged blunting of the right costophrenic angle. Re- demonstrated single lead pacer apparatus, tip stable in position.  IMPRESSION: Interval improvement right mid lung heterogeneous opacities.  Small right pleural effusion.   Electronically Signed   By: Annia Beltrew  Davis M.D.   On: 01/04/2014 07:24      PULMONARY A: Acute on chronic respiratory failure secondary to AECOPD vs. CHF (07/09/13 LVEF 35-40%) Concern R PNA  - improved, needing 3L [place]. Walking wit PT. Morphine helped dyspnea   P:   Morphine bid  po prn for dyspnea relief: to go home with this Duonebs q4h Albuterol q2h PRN IV steroids changed to pred 4/19 >> will need to wean to baseline of 5mg  QD PRN saturation checks, wean O2 as tolerated Consider resume QVAR at dc   CARDIOVASCULAR A:  Possible combined diastolic/systolic CHF exacerbation - less impressed for this Permanent atrial fibrillation with HR controlled with amio, dig,  On pradaxa for anticoagulation H/o Vtach, permanent pacemaker  CAD s/p stenting Chest pain - resolved.  HTN H/o AAA   - trop negative. Sligh high trop few days ago  P:  Dc Telemetry NTG paste q6h PRN  Continue home digoxin 0.125mg  IV qd, level reviewed (1.1), 4/21 change to po Bisoprolol, diltiazem for now  , home meds Home  amiodarone, imdur, crestor to continue Restart home lasix 01/05/14 Recheck bnp 01/06/14 Check digoxin level 01/06/14  RENAL Lab Results  Component Value Date   CREATININE 1.21 01/05/2014   CREATININE 1.35 01/04/2014   CREATININE 1.32 01/03/2014    A:   BPH ARF - resolved  P:   Recheck bmet 01/06/14 after lasix 01/05/14 Dc foley but restart  home tamsulosin, finasteride, lasix pn 01/05/14 Monitor BMP    GASTROINTESTINAL A:   Nutrition GI px  P:   Diet as tolerated GI px not indicated  HEMATOLOGIC A:   VTE px Chronic anticoagulation on home pradaxa  P:  Continue pradxa (restarted after creat normalized)   INFECTIOUS A:   Leukocytosis, on chronic steroids Developing Rt PNA? 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 Resolved 01/04/14 P:   monitor  GLOBAL Code: DNR but full medical care. Seen by PT. He wants to go back to assisted living. Will ask care manager to coordinate  with PT to see if he needs home PT     Dr. Kalman ShanMurali Jaman Aro, M.D., [facility]P Pulmonary and Critical Care Medicine Staff Physician [facility] Pager: [phone], If no answer or between  15:00h - 7:00h: call [phone]  01/05/2014 12:27 PM

## 2014-01-05 NOTE — Care Management Note (Signed)
  Page 2 of 2   01/05/2014     4:33:31 PM CARE MANAGEMENT NOTE 01/05/2014  Patient:  [name],[name]   Account Number:  [id number]  Date Initiated:  01/04/2014  Documentation initiated by:  Ronny FlurryWILE,Angelamarie Avakian  Subjective/Objective Assessment:     Action/Plan:   Anticipated DC Date:  01/06/2014   Anticipated DC Plan:  HOME W HOME HEALTH SERVICES  In-house referral  Clinical Social Worker         Choice offered to / List presented [facility]:          [facility]      [facility]  [facility]   Status of service:  Completed, signed off Medicare Important Message given?   (If response is "NO", the following Medicare IM given date fields will be blank) Date Medicare IM given:   Date Additional Medicare IM given:    Discharge Disposition:    Per UR Regulation:    If discussed at Long Length of Stay Meetings, dates discussed:    Comments:  01-05-14 Patient and daughter have decided patient will return to independent living with [facility]. Ronny FlurryHeather Griselda Bramblett RN BSN [phone]   01-05-14 PT recommendation : If needing O2 then pt agreeable to SNF, if no O2 then patient to return home with HHPT  Ambulated on 2 liters remained >93%, decreased to room air and desaturated to 88%, rebounded with rest and replacement of O2 92 liters)   Left voice mail for Jarome Matinmily SW and entered SW consult .     01-04-14 PT recommending HHPT . Patient lives at [facility] [phone] , [facility] [place] [facility] .   Ronny FlurryHeather Zorian Gunderman RN BSN [phone]

## 2014-01-05 NOTE — Progress Notes (Addendum)
Physical Therapy Treatment Patient Details Name: Jeanella AntonJoe B Grealish MRN: [id number] DOB: [birth year] Today's Date: 01/05/2014    History of Present Illness 80 y/o male with COPD (on chronic steroids, not on home O2, never intubated), CHF, Afib, CAD s/p stenting, HTN, and anxiety who presents to ED with worsening SOB.    PT Comments    Patient mobility improving but patient still with increased DOE and desaturating when attempting mobility on room air.  Patient states that he is agreeable to ST SNF if he needs to leave the hospital using supplemental oxygen.  If he does not need supplemental oxygen then patient will go back to ALF with home PT.   Follow Up Recommendations  If needing O2 then pt agreeable to SNF, if no O2 then patient to return home with HHPT     Equipment Recommendations  None recommended by PT (patient has equipment)    Recommendations for Other Services       Precautions / Restrictions Restrictions Weight Bearing Restrictions: No    Mobility  Bed Mobility Overal bed mobility: Modified Independent                Transfers Overall transfer level: Needs assistance Equipment used: Rolling walker (2 wheeled) Transfers: Sit to/from Stand Sit to Stand: Supervision         General transfer comment: no physical assist needed  Ambulation/Gait Ambulation/Gait assistance: Supervision;Min guard Ambulation Distance (Feet): 110 Feet (x2 with 2 standing rest breaks) Assistive device: Rolling walker (2 wheeled) Gait Pattern/deviations: Decreased stride length;Trunk flexed;Narrow base of support Gait velocity: decreased Gait velocity interpretation: Below normal speed for age/gender General Gait Details: VCs for postion within RW. VCs for breathing. Ambulated on 2 liters remained >93%, decreased to room air and desaturated to 88%, rebounded with rest and replacement of O2 92 liters)   Stairs            Wheelchair Mobility    Modified Rankin (Stroke  Patients Only)       Balance                                    Cognition Arousal/Alertness: Awake/alert Behavior During Therapy: WFL for tasks assessed/performed Overall Cognitive Status: Within Functional Limits for tasks assessed                      Exercises      General Comments        Pertinent Vitals/Pain 2 liters at rest before activity 96%, >93% with 2 liters during activity, during ambulation on ROOM AIR, patient desaturates to 88%. May need supplemental O2 upon discharge.    Home Living                      Prior Function            PT Goals (current goals can now be found in the care plan section) Acute Rehab PT Goals Patient Stated Goal: to go back to his AL Apartment PT Goal Formulation: With patient Time For Goal Achievement: 01/18/14 Potential to Achieve Goals: Good Progress towards PT goals: Progressing toward goals    Frequency  Min 4X/week    PT Plan Discharge plan needs to be updated    Co-evaluation             End of Session Equipment Utilized During Treatment: Gait belt;Oxygen Activity Tolerance: Patient tolerated  treatment well;Patient limited by fatigue Patient left: in chair;with call bell/phone within reach     Time: 1246-1307 PT Time Calculation (min): 21 min  Charges:  $Gait Training: 8-22 mins                    G Codes:      Fabio AsaDevon J Toney Lizaola 01/05/2014, 1:27 PM Charlotte Crumbevon Aris Moman, PT DPT  [phone]

## 2014-01-06 LAB — BASIC METABOLIC PANEL
BUN: 35 mg/dL — ABNORMAL HIGH (ref 6–23)
CALCIUM: 9.3 mg/dL (ref 8.4–10.5)
CO2: 30 mEq/L (ref 19–32)
Chloride: 101 mEq/L (ref 96–112)
Creatinine, Ser: 1.15 mg/dL (ref 0.50–1.35)
GFR calc Af Amer: 66 mL/min — ABNORMAL LOW (ref >90)
GFR, EST NON AFRICAN AMERICAN: 57 mL/min — AB (ref >90)
Glucose, Bld: 106 mg/dL — ABNORMAL HIGH (ref 70–99)
POTASSIUM: 4.7 meq/L (ref 3.7–5.3)
Sodium: 141 mEq/L (ref 137–147)

## 2014-01-06 LAB — CBC
HCT: 46 % (ref 39.0–52.0)
Hemoglobin: 14.7 g/dL (ref 13.0–17.0)
MCH: 30.4 pg (ref 26.0–34.0)
MCHC: 32 g/dL (ref 30.0–36.0)
MCV: 95.2 fL (ref 78.0–100.0)
PLATELETS: 157 10*3/uL (ref 150–400)
RBC: 4.83 MIL/uL (ref 4.22–5.81)
RDW: 14 % (ref 11.5–15.5)
WBC: 12.6 10*3/uL — ABNORMAL HIGH (ref 4.0–10.5)

## 2014-01-06 LAB — PHOSPHORUS: PHOSPHORUS: 3.4 mg/dL (ref 2.3–4.6)

## 2014-01-06 LAB — PRO B NATRIURETIC PEPTIDE: PRO B NATRI PEPTIDE: 8692 pg/mL — AB (ref 0–450)

## 2014-01-06 LAB — GLUCOSE, CAPILLARY
Glucose-Capillary: 87 mg/dL (ref 70–99)
Glucose-Capillary: 204 mg/dL — ABNORMAL HIGH (ref 70–99)

## 2014-01-06 LAB — DIGOXIN LEVEL: DIGOXIN LVL: 1.4 ng/mL (ref 0.8–2.0)

## 2014-01-06 LAB — MAGNESIUM: MAGNESIUM: 2 mg/dL (ref 1.5–2.5)

## 2014-01-06 MED ORDER — IPRATROPIUM-ALBUTEROL 0.5-2.5 (3) MG/3ML IN SOLN
3.0000 mL | Freq: Three times a day (TID) | RESPIRATORY_TRACT | Status: DC
  Administered 2014-01-06: 3 mL via RESPIRATORY_TRACT
  Filled 2014-01-06 (×2): qty 3

## 2014-01-06 MED ORDER — MORPHINE SULFATE 10 MG/5ML PO SOLN: 4.0000 mg | Freq: Two times a day (BID) | ORAL | Status: DC | PRN

## 2014-01-06 MED ORDER — PREDNISONE 10 MG PO TABS
ORAL_TABLET | ORAL | Status: DC
Start: 2014-01-06 — End: 2014-02-15

## 2014-01-06 NOTE — Progress Notes (Signed)
Physical Therapy Treatment Patient Details Name: [name] MRN: [id number] DOB: August 27, 1928 Today's Date: 01/06/2014    History of Present Illness 80 y/o male with COPD (on chronic steroids, not on home O2, never intubated), CHF, Afib, CAD s/p stenting, HTN, and anxiety who presents to ED with worsening SOB.  Dx with COPD exacerbation and CHF exacerbation.      PT Comments    Pt is progressing well with his ambulation.  O2 sats better today despite DOE during gait.  He has a rollator at home, which is helpful for as frequently as he requires rest breaks during gait.  He continues to need assist as he fatigues and when he is getting off of low sitting surfaces.  PT recommending HH f/u as he and his daughter have refused SNF placement.   Follow Up Recommendations  Home health PT;Supervision - Intermittent     Equipment Recommendations  None recommended by PT    Recommendations for Other Services   None     Precautions / Restrictions Restrictions Weight Bearing Restrictions: No    Mobility    Transfers Overall transfer level: Needs assistance Equipment used: Rolling walker (2 wheeled) Transfers: Sit to/from Stand Sit to Stand: Min guard         General transfer comment: Min guard assist from low sitting surfaces (recliner and low hallway chair) to steady pt for balance as he fatigued.   Ambulation/Gait Ambulation/Gait assistance: Min guard Ambulation Distance (Feet): 120 Feet (with 2 seated rest breaks due to DOE 3/4 during gait) Assistive device: Rolling walker (2 wheeled) Gait Pattern/deviations: Step-through pattern;Trunk flexed;Shuffle (heavy reliance on upper extremities with fatigue) Gait velocity: decreased Gait velocity interpretation: Below normal speed for age/gender General Gait Details: Pt needed two seated rest breaks today during gait.  Lowest ovserved O2 sats on RA during gait were 90%.  HR in the 80s to 90s.  DOE 3/4 during gait.  Per pt he uses a 4  wheeled walker with seat to help him with his DOE on a daily basis.            Cognition Arousal/Alertness: Awake/alert Behavior During Therapy: WFL for tasks assessed/performed Overall Cognitive Status: Within Functional Limits for tasks assessed                             Pertinent Vitals/Pain HR 80-90s throughout session, O2 sats 90-98% on RA throughout gait and mobility despite DOE 3/4 and needing 2 ~3 min seated rest breaks during gait.             PT Goals (current goals can now be found in the care plan section) Acute Rehab PT Goals Patient Stated Goal: to go back to his AL Apartment Progress towards PT goals: Progressing toward goals    Frequency  Min 3X/week    PT Plan Discharge plan needs to be updated       End of Session   Activity Tolerance: Patient limited by fatigue (limited by DOE) Patient left: in chair;with call bell/phone within reach     Time: 1129-1146 PT Time Calculation (min): 17 min  Charges:  $Gait Training: 8-22 mins                      Marilynn Ekstein B. Story Vanvranken, PT, DPT [phone]   01/06/2014, 11:53 AM

## 2014-01-06 NOTE — Progress Notes (Signed)
PULMONARY / CRITICAL CARE MEDICINE   Name: Darin AntonJoe B Toutant MRN: [id number] DOB: 09/23/1927    ADMISSION DATE:  12/31/2013 CONSULTATION DATE:  12/31/2013  REFERRING MD :  ED PRIMARY SERVICE: PCCM  CHIEF COMPLAINT:  Shortness of breath, weakness.   HISTORY OF PRESENT ILLNESS:  80 y/o male with COPD (on chronic steroids, not on home O2, never intubated), CHF, Afib, CAD s/p stenting, HTN, and anxiety who presents to ED with worsening SOB.  LINES / TUBES: PIV 4/17>>  CULTURES: MRSA PCR 4/17>> NEG UA 4/17>>neg  ANTIBIOTICS: Levaquin 4/17>> Zosyn 4/17 x 1  Subjective:   01/04/14: SOB unchanged. On 2 l [place] with O2 sats 100%. He has not had PT evaluation yet(it was ordered 4/20)  01/05/14: feeling better. Needing 3L [place]; drops to 89% with ambulation which is ok. Morphine x 1 oral syrup helped dyspnea. He looks forward to assisted living dc but with possible PT at home  01/06/14 desat with ambulation to 81-83%, needs home O2.  Does not want SNF. Ambulated around unit.  Feeling much better.  Wants to go home.   VITAL SIGNS: BP 143/92  Pulse 84  Temp(Src) 97.7 F (36.5 C) (Oral)  Resp 16  Ht 5' 8.9" (1.75 m)  Wt 164 lb 3.2 oz (74.481 kg)  BMI 24.32 kg/m2  SpO2 95%   INTAKE / OUTPUT: I/O last 3 completed shifts: In: 460 [P.O.:460] Out: 4025 [Urine:4025] Total I/O In: -  Out: 365 [Urine:365]   PHYSICAL EXAMINATION: General:  wdwn elderly male in NAD. Looks better Neuro:  Awake, cooperative, chatty, well oriented HEENT:  [place]/AT, [place] 2L Cardiovascular:  s1 s2 RRR Lungs:  No distress , unlabored, full sentences, diminished  bs Abdomen:  +BS, soft, NT/ND Musculoskeletal:  Moves all extremities Skin:  warm, dry, b/l LE trace edema   LABS:  PULMONARY  Recent Labs Lab 12/31/13 1619  PHART 7.303*  PCO2ART 47.9*  PO2ART 82.0  HCO3 23.7  TCO2 25  O2SAT 95.0    CBC  Recent Labs Lab 01/02/14 0735 01/03/14 1319 01/05/14 0400  HGB 12.9* 14.0 13.1  HCT 40.8 43.8 41.8   WBC 15.3* 12.6* 8.0  PLT 138* 146* 136*    COAGULATION  Recent Labs Lab 12/31/13 1720  INR 1.41    CARDIAC    Recent Labs Lab 12/31/13 1720 12/31/13 2236 01/01/14 0317  TROPONINI <0.30 <0.30 <0.30    Recent Labs Lab 12/31/13 1221 01/06/14 0354  PROBNP 3105.0* 8692.0*     CHEMISTRY  Recent Labs Lab 12/31/13 1720  01/02/14 0735 01/03/14 1319 01/04/14 0432 01/05/14 0400 01/06/14 0354  NA  --   < > 140 141 141 142 141  K  --   < > 3.9 4.4 3.9 4.8 4.7  CL  --   < > 99 100 102 102 101  CO2  --   < > 27 29 28 27 30   GLUCOSE  --   < > 125* 188* 103* 108* 106*  BUN  --   < > 39* 41* 40* 36* 35*  CREATININE  --   < > 1.59* 1.32 1.35 1.21 1.15  CALCIUM  --   < > 8.9 9.3 9.0 9.1 9.3  MG 1.7  --   --   --  2.2 2.4 2.0  PHOS 4.1  --   --   --  3.2 3.4 3.4  < > = values in this interval not displayed. Estimated Creatinine Clearance: 48.5 ml/min (by C-G formula based on Cr  of 1.15).   LIVER  Recent Labs Lab 12/31/13 1221 12/31/13 1720  AST 18  --   ALT 18  --   ALKPHOS 75  --   BILITOT 0.8  --   PROT 6.5  --   ALBUMIN 3.7  --   INR  --  1.41     INFECTIOUS  Recent Labs Lab 12/31/13 1720  LATICACIDVEN 2.1  PROCALCITON 0.15     ENDOCRINE CBG (last 3)   Recent Labs  01/05/14 1656 01/05/14 2128 01/06/14 0734  GLUCAP 229* 176* 87      IMAGING x48h  No results found.    PULMONARY A: Acute on chronic respiratory failure secondary to AECOPD vs. CHF (07/09/13 LVEF 35-40%) Concern R PNA  - improved, needs home O2.    P:   Morphine bid po prn for dyspnea relief: to go home with this Duonebs q4h Albuterol q2h PRN Continue PO pred with taper back to baseline of 5mg  QD wean O2 as tolerated Consider resume QVAR at dc   CARDIOVASCULAR A:  Possible combined diastolic/systolic CHF exacerbation - less impressed for this Permanent atrial fibrillation with HR controlled with amio, dig,  On pradaxa for anticoagulation H/o Vtach,  permanent pacemaker  CAD s/p stenting Chest pain - resolved.  HTN H/o AAA  P:  NTG paste q6h PRN  Continue po digoxin  Bisoprolol, diltiazem for now  - home meds Home  amiodarone, imdur, crestor to continue Restart home lasix 01/05/14 F/u BNP - trending up    RENAL A:   BPH ARF - resolved  P:   F/u chem  cont home tamsulosin, finasteride, lasix  Monitor BMP    GASTROINTESTINAL A:   Nutrition GI px  P:   Diet as tolerated GI px not indicated  HEMATOLOGIC A:   VTE px Chronic anticoagulation on home pradaxa  P:  Continue pradxa (restarted after creat normalized)   INFECTIOUS A:   Leukocytosis, on chronic steroids Developing Rt PNA? 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 Resolved 01/04/14 P:   monitor  GLOBAL Code: DNR but full medical care. Seen by PT. Progressing well.  Does NOT want SNF at d/c.  WIll work towards d/c home to previous independent living.    Bernadene PersonKathryn A Whiteheart, NP 01/06/2014  10:08 AM Pager: [phone] or [phone]  *Care during the described time interval was provided by me and/or other providers on the critical care team. I have reviewed this patient's available data, including medical history, events of note, physical examination and test results as part of my evaluation.

## 2014-01-06 NOTE — Progress Notes (Signed)
SATURATION QUALIFICATIONS: (This note is used to comply with regulatory documentation for home oxygen)  Patient Saturations on Room Air at Rest = 98%  Patient Saturations on Room Air while Ambulating = lowest 90%  Patient Saturations on 0 Liters of oxygen while Ambulating = pt did not wear oxygen  Please briefly explain why patient needs home oxygen:

## 2014-01-06 NOTE — Discharge Summary (Signed)
Physician Discharge Summary  Patient ID: [name] MRN: [id number] DOB/AGE: 02-17-1928 80 y.o.  Admit date: 12/31/2013 Discharge date: 01/06/2014    Discharge Diagnoses:  Active Problems:   COPD exacerbation   Acute on chronic combined systolic and diastolic heart failure   Acute-on-chronic respiratory failure   COPD (chronic obstructive pulmonary disease)   Hypertension                                                        D/c plan for outpt   Acute on chronic respiratory failure - r/t AECOPD +/- acute on chronic combined CHF (ef 35-40% 10/14) D/c plan --  Does  NOT need home O2  (he refused and then on day of dc retested ambulating he did not desaturate on RA) - staff MD comment outpt pulm f/u within 2 weeks, consider f/u CXR  BD's - xopenex, atrovent, qvar  PRN morphine for dyspnea  Steroid taper back to maintenance $RemoveBefore'5mg'vOlSOISaLziQK$ /day   Possible combined diastolic/systolic CHF exacerbation Permanent atrial fibrillation with HR controlled with amio, dig, On pradaxa for anticoagulation  Chest pain - resolved.  Troponins neg.  HTN  D/c plan -- Resume home rx Dig level as outpt (was low this admit, dose increased temporarily, will d/c home on previous dose)  Cont home lasix - BNP trending up but does not appear volume overloaded   BPH  ARF - resolved D/c plan --  F/u chem  cont home tamsulosin, finasteride, lasix   Acute encephalopathy . Resolved 01/04/14, likely r/t hypercarbia  D/c plan --  Monitor  PRN low dose morphine     Brief Hospital Summary: [name] is a 80 y.o. y/o male with a PMH of COPD, (on chronic steroids), CHF, AFib, HTN admitted 4/17 with acute on chronic respiratory failure r/t AECOPD +/- acute on chronic CHF.  He was treated with IV abx, O2, steroids, BD's, diuresis.  He required Bipap briefly.  He improved quickly but cont to require O2, which he will require at d/c per ambulatory desat results (81% on RA with activity).  Home anti-Htn were  restarted slowly, and dig dose was increased while inpt r/t low dig level on admit.  He is now near baseline, tolerating home meds and steroid taper. He was followed by physical therapy and is ready for d/c back to independent living with new home O2.    LINES / TUBES:  PIV 4/17>>   CULTURES:  MRSA PCR 4/17>> NEG  UA 4/17>>neg   ANTIBIOTICS:  Levaquin 4/17>> 4/23 Zosyn 4/17 x 1     Filed Vitals:   01/05/14 2208 01/06/14 0142 01/06/14 0458 01/06/14 1036  BP: 141/60  143/92   Pulse: 92  84 90  Temp: 97.6 F (36.4 C)  97.7 F (36.5 C)   TempSrc: Oral  Oral   Resp: 17  16   Height:      Weight:   164 lb 3.2 oz (74.481 kg)   SpO2: 96% 95% 95%      Discharge Labs  BMET  Recent Labs Lab 12/31/13 1720  01/02/14 0735 01/03/14 1319 01/04/14 0432 01/05/14 0400 01/06/14 0354  NA  --   < > 140 141 141 142 141  K  --   < > 3.9 4.4 3.9 4.8 4.7  CL  --   < >  99 100 102 102 101  CO2  --   < > 27 29 28 27 30   GLUCOSE  --   < > 125* 188* 103* 108* 106*  BUN  --   < > 39* 41* 40* 36* 35*  CREATININE  --   < > 1.59* 1.32 1.35 1.21 1.15  CALCIUM  --   < > 8.9 9.3 9.0 9.1 9.3  MG 1.7  --   --   --  2.2 2.4 2.0  PHOS 4.1  --   --   --  3.2 3.4 3.4  < > = values in this interval not displayed.   CBC   Recent Labs Lab 01/02/14 0735 01/03/14 1319 01/05/14 0400  HGB 12.9* 14.0 13.1  HCT 40.8 43.8 41.8  WBC 15.3* 12.6* 8.0  PLT 138* 146* 136*   Anti-Coagulation  Recent Labs Lab 12/31/13 1720  INR 1.41       Future Appointments Provider Department Dept Phone   01/18/2014 9:00 AM Melvenia Needles, NP [facility] [phone]   02/18/2014 2:00 PM Darlin Coco, MD [place] [facility] [phone]           Follow-up Information   Follow up with [facility], NP On 01/18/2014. (9:00am with Dr. Orvil Feil Nurse Practitioner )    Specialty:  Nurse Practitioner   Contact information:   [address] [address] [phone]        Follow up with Marton Redwood, MD On 01/13/2014. (3:25pm )    Specialty:  Internal Medicine   Contact information:   [place] [address] [phone]          Medication List         acetylcysteine 10 % nebulizer solution  Commonly known as:  MUCOMYST  6 ML until nebulized. Do not use more than 4 times each month     albuterol (2.5 MG/3ML) 0.083% nebulizer solution  Commonly known as:  PROVENTIL  Take 2.5 mg by nebulization every 6 (six) hours as needed for wheezing or shortness of breath.     amiodarone 200 MG tablet  Commonly known as:  PACERONE  Take 1 tablet (200 mg total) by mouth daily.     beclomethasone 80 MCG/ACT inhaler  Commonly known as:  QVAR  Inhale 1 puff into the lungs 2 (two) times daily.     bisoprolol 10 MG tablet  Commonly known as:  ZEBETA  Take 1 tablet (10 mg total) by mouth daily.     cholecalciferol 1000 UNITS tablet  Commonly known as:  VITAMIN D  Take 2,000 Units by mouth daily.     dabigatran 150 MG Caps capsule  Commonly known as:  PRADAXA  Take 150 mg by mouth every 12 (twelve) hours.     digoxin 0.125 MG tablet  Commonly known as:  LANOXIN  Take 0.125 mg by mouth daily.     diltiazem 240 MG 24 hr capsule  Commonly known as:  DILACOR XR  Take 240 mg by mouth daily.     finasteride 5 MG tablet  Commonly known as:  PROSCAR  Take 5 mg by mouth daily.     furosemide 20 MG tablet  Commonly known as:  LASIX  Take 20 mg by mouth daily. TAKE one daily     glimepiride 2 MG tablet  Commonly known as:  AMARYL  Take 1 tablet (2 mg total) by mouth daily with breakfast.     guaiFENesin  600 MG 12 hr tablet  Commonly known as:  MUCINEX  Take 600 mg by mouth at bedtime.     ipratropium 0.02 % nebulizer solution  Commonly known as:  ATROVENT  Take 2.5 mLs (0.5 mg total) by nebulization every 6 (six) hours.     isosorbide mononitrate 60 MG 24 hr tablet  Commonly known as:  IMDUR  Take 60 mg by mouth daily.      levalbuterol 1.25 MG/0.5ML nebulizer solution  Commonly known as:  XOPENEX  Take 1.25 mg by nebulization every 6 (six) hours.     morphine 10 MG/5ML solution  Take 2 mLs (4 mg total) by mouth 2 (two) times daily as needed (refractory dyspnea).     nitroGLYCERIN 0.4 MG SL tablet  Commonly known as:  NITROSTAT  Place 0.4 mg under the tongue every 5 (five) minutes as needed. For chest pain     predniSONE 10 MG tablet  Commonly known as:  DELTASONE  Take 5 mg by mouth daily.     predniSONE 10 MG tablet  Commonly known as:  DELTASONE  3 tabs PO daily x 3 days, then 2 tabs PO daily x 3 days, then 1 tab PO daily x 3 days then back to 32m daily maintenance     rosuvastatin 40 MG tablet  Commonly known as:  CRESTOR  Take 40 mg by mouth daily.     sennosides-docusate sodium 8.6-50 MG tablet  Commonly known as:  SENOKOT-S  Take 2 tablets by mouth at bedtime.     tamsulosin 0.4 MG Caps capsule  Commonly known as:  FLOMAX  Take 0.4 mg by mouth daily.          Disposition: Home with home health   Discharged Condition: Darin RIDDLEhas met maximum benefit of inpatient care and is medically stable and cleared for discharge.  Patient is pending follow up as above.      Time spent on disposition:  Greater than 35 minutes.   Signed: KMarijean Heath NP 01/06/2014  10:56 AM Pager: [phone] *Care during the described time interval was provided by me and/or other providers on the critical care team. I have reviewed this patient's available data, including medical history, events of note, physical examination and test results as part of my evaluation.    Dr. MBrand Males M.D., FQuail Run Behavioral HealthC.P Pulmonary and Critical Care Medicine Staff Physician CCarson CityPulmonary and Critical Care Pager: [phone] If no answer or between  15:00h - 7:00h: call [phone]  01/06/2014 12:18 PM

## 2014-01-06 NOTE — Progress Notes (Signed)
AVS discharge instructions were given and went over with patient and his daughter. Patient's daughter was given his prescription for morphine. Patient and his daughter did not have any questions. Volunteers called to assist patient to his transportation.

## 2014-01-06 NOTE — Clinical Social Work Note (Signed)
CSW received consult for possible SNF placement at time of discharge. CSW completed chart review and spoke with pt's preferred SNF placement [[facility]] admissions liaison. Per admissions liaison, pt does not qualify for SNF placement per current chart review. Admissions liaison stated pt could qualify for respite care at private pay [$250/day]. CSW spoke with pt and pt's daughter regarding information above. Pt and pt's daughter discussed discharge options. Pt's daughter informed CSW of pt and pt's daughter preference for pt to return to independent living [[facility]] with home health services at time of discharge.  CSW confirmed with [facility] admissions of pt's ability to be on oxygen while living in independent living. CSW consulted with RNCM regarding information above. CSW signing off as [facility] health services at time of discharge   Darlyn Chambermily [place], [place]CSWA Clinical Social Worker [phone]

## 2014-01-07 ENCOUNTER — Telehealth: Payer: Self-pay

## 2014-01-07 MED ORDER — MORPHINE SULFATE 10 MG/5ML PO SOLN: 4.0000 mg | Freq: Two times a day (BID) | ORAL | Status: DC | PRN

## 2014-01-07 NOTE — Telephone Encounter (Signed)
Lot of pharmacies have 10mg /275mL. AT 2mL this is 4mg  per dose. Victorino DikeJennifer has done this many time for me from office  If 20mg /ML, then they should do 0.282mL/ dose = 4mg . They should give him a TB syringe to accurately measure   Either or is fine but pharmacy needs to ensure patient does not overdose.

## 2014-01-07 NOTE — Telephone Encounter (Signed)
rx has been printed out for MW to sign and we will fax this to the pharmacy.  rx has been faxed to the pharmacy and nothing further is needed.

## 2014-01-07 NOTE — Telephone Encounter (Signed)
MW please advise if you are ok to sign this rx for the pts morphine.  Pt is a hospice pt.  thanks

## 2014-01-07 NOTE — Telephone Encounter (Signed)
Spoke with pharmacy.  Randon GoldsmithKatie Whiteheart wrote pt RX for morhine 10mg /5ML 2 ML bid PRN #15 ML Per the pharmacy they only can do morphine 20mg /ML 1 ml bid PRN. #15 ML BOTTLE. Quantity can not be changed. Please advise MR thanks    MR paged at 2/18

## 2014-01-13 NOTE — Progress Notes (Signed)
Dr. Kalman ShanMurali Hye Trawick, M.D., [facility] Center[facility].C.C.P Pulmonary and Critical Care Medicine Staff Physician  System [place] [facility] Pager: [phone], If no answer or between  15:00h - 7:00h: call [phone]  01/13/2014 4:50 PM

## 2014-01-17 ENCOUNTER — Ambulatory Visit (INDEPENDENT_AMBULATORY_CARE_PROVIDER_SITE_OTHER): Payer: Medicare Other | Admitting: Adult Health

## 2014-01-17 ENCOUNTER — Ambulatory Visit (INDEPENDENT_AMBULATORY_CARE_PROVIDER_SITE_OTHER)
Admission: RE | Admit: 2014-01-17 | Discharge: 2014-01-17 | Disposition: A | Discharge status: Home / Self Care | Payer: Medicare Other | Source: Ambulatory Visit | Attending: Adult Health | Admitting: Adult Health

## 2014-01-17 ENCOUNTER — Encounter: Payer: Self-pay | Admitting: Adult Health

## 2014-01-17 VITALS — BP 114/72 | HR 90 | Temp 97.7°F | Ht 69.0 in | Wt 173.2 lb

## 2014-01-17 DIAGNOSIS — J189 Pneumonia, unspecified organism: Secondary | ICD-10-CM

## 2014-01-17 DIAGNOSIS — J441 Chronic obstructive pulmonary disease with (acute) exacerbation: Secondary | ICD-10-CM

## 2014-01-17 DIAGNOSIS — I5043 Acute on chronic combined systolic (congestive) and diastolic (congestive) heart failure: Secondary | ICD-10-CM

## 2014-01-17 NOTE — Assessment & Plan Note (Signed)
Recent flare during acute illness Appears euvolemic. Continue on current regimen

## 2014-01-17 NOTE — Assessment & Plan Note (Addendum)
Recent exacerbation, now resolved Continue on current regimen and remain on 5 mg of prednisone daily Repeat cxr today   Plan  Continue on current regimen  Chest xray today  Remain on prednisone 5mg  daily  follow up Dr. Marchelle Gearingamaswamy in 4 weeks and .As needed   Please contact office for sooner follow up if symptoms do not improve or worsen or seek emergency care

## 2014-01-17 NOTE — Progress Notes (Signed)
Subjective:    Patient ID: [name], male    DOB: 23-Jan-1928, 80 y.o.   MRN: [id number]  HPI   #. 75 pack smoking hx. Quit 2010. Known CAD  #. COPD - Gold stage 3-4  On atrovent, pulmicort nebs, Xopenex and daily prednisone 5 mg - PFTs 11/21/2010  - spirometry only: Fev1 0.84L/31%, Ratio 45. Gold stage 3-4 COPD Spiro 04/26/2011 - fev1 1.06L/34% CAT score 17 - 02/04/2012 Did not desaturate May 2012 185 feet x 3 laps.   3. AECOPD hx:   -March 2012 (LLL PNA),   - April 2012 - clear cxr - Acute Visit 03/05/12 - OPD Rx - June 2013 - a fib and aECopd Admit - July 2013  - for HCAP - January 2013-admitted by Dr. Lutricia Feil for COPD exacerbation. Treated in the telemetry unit. No BiPAP - March 2014: Augmentin and prednisone outpatient treatment. Start N. Acetylcysteine  #IMaging hx  - 2005 CT scan chest no evidence of lung cancer - 2014 January chest x-ray no evidence of lung cancer - 2014 March: discussed - he is not interested in screening, "I do not want to know about it"    OV 12/09/2012  COPD followup. He continues to live in [name]. He does daily exercises. In January 2014 he was admitted for COPD exacerbation. He was doing well but a few days ago developed sinusitis. His  primary care physician Dr. Lutricia Feil  started him on Augmentin on 12/04/2012. He still has a few more days of Augmentin left. He is feeling better but he feels that rate of resolution is low. He feels there is a lot of chest congestion. He had tried increasing his daily prednisone from 5 mg to 10 mg but this has not helped. COPD cat score is 25 and significantly worse than baseline; all documented below  #COPD  You have mild attack of copd called COPD exacerbation  Avoid exercises in the middle of a COPD exacerbation  Please take prednisone 13m once daily x 3 days, then 351mdaily x 3 days, 2036mnce daily x 3 days, then 72m72mce daily x 3 days, then 5mg 74me daily to continue  Start N.  acetylcysteine 600 mg twice a day  - [name] published in [place]014 shows it helps   - this is not to make you feel better but to to prevent recurrent bronchitis episodes  - You are more likely to get this drug at [facility] vitamin store then at [employer]  - You can also get this at website www.vWholesaleCream.sit functions as an anti-oxidant and there are minimal/none side effects  #Lung cancer screening - We discussed this but I respect your desire to postpone it  #Followup 4-5 months with COPD cat score at followup  OV 04/08/2013  FU COPD -- severe/very severe  Since lst OV has improved. [name] Occ uses cane. COPD is stable. CAT score is 17 (see below) and reflects baseline activity. He is looking better an dfeeling better. Continues on duopneb q6h + qvar bid + xopnex prn + pred 5mg p31mday. Has not taken NAC. REltuctant for mdi changes due to vA system logistics.  Only issue is recent cold and saw PMD who bumped his pred up and now back to [place]. He does not want to titrate prednisone down further; wants to remain at 5mg pe62may   OV 10/11/2013  Chief Complaint  Patient presents with  . Follow-up    Pt just got out of the hospital friday. He had PNA. Pt c/o SOB, prod cough w/ white phlem, wheezing, chest tx.    FU COPD - Hospitalized around Christmas 2014 2 09/17/2013 under his primary care service. Apparently diagnosis was pneumonia not otherwise specified, review of imaging shows some right lower lobe infiltrate. I do not see any positive blood culture sputum culture or flu panel. He was then discharged to [name]. Then on 10/08/2013 he was discharged to his assisted living at [name]. Physical therapy there is pending. He is improved from his recent hospitalization but he does feel deconditioned compared to baseline and more dyspneic compared to baseline. COPD cat score has declined  back to 24. Is compliant with his nebulizers. He says that while in the hospital he was given some nebulizers Mucomyst that really helped bring out his sputum and he wants to give this a try. I've warned him about the paradoxical bronchospasm so he will try to judicious amounts  REC Glad you are better but understand you still have some way to go Continue PT at [facility], O2 and other nebs To help mucus clearance  - You can try mucomyst nebulizer Acetylcysteine 10% solution, 6 mL until nebulized. Do not try more than 4 times each month because it can make wheezing worse while trying to get rid of your sputum  FOllowup  3 months or sooner CAT score at followup  OV 11/08/2013  Chief Complaint  Patient presents with  . Acute Visit    Pt states Thursday he has a stomach virus and this made him weak. He  states since then he has been having increased SOB, chest congestion, productive cough wiht yellow phlegm.    Acute visit. Was feeling baseline. But on 11/04/2013 had some nausea with vomiting and later in the day diarrhea. But both resolved by end of the day 11/04/2013. Then on 11/05/2013 started noticing dyspnea on exertion that was worse than baseline. This is moderate in severity. Relieved by rest. Has been stable to slightly worse over the next few days. However today started having increasing cough compared to baseline and slight change in color of sputum to thick yellow. And also increased sputum volume.  He is frustrated that he does not have a new neb machine despite [place] putting in order. Also his Mucomyst nebulizers have not come through despite [place] talking to pharmacy and arranging 20% solution.   Dg Chest 2 View >>  01/17/2014 [facility] follow up  Returns for post hospital follow up .  Admitted 4/17-4/23 for COPD exacerbation, acute on chronic combined systolic and diastolic heart failure , and PNA .  Patient was treated with IV antibiotics, steroids, and nebulized  bronchodilators. He did require BiPAP support briefly. Improved with diuresis. Chest x-ray prior to discharge showed improved right sided lung infiltrates.  Since discharge. Patient is improved with decreased cough, congestion. Patient does feel weak, and has not regained his energy level. Yet. He denies any hemoptysis, chest pain, orthopnea, PND, or leg swelling Patient has tapered his prednisone to 5 mg daily.     Review of Systems  Constitutional: Negative for fever and unexpected weight change.  HENT: Negative for congestion, dental problem, ear pain, nosebleeds, postnasal drip, rhinorrhea, sinus pressure, sneezing, sore throat and trouble swallowing.   Eyes: Negative for redness and itching.  Respiratory: Positive for cough,  Cardiovascular: Negative for palpitations  Gastrointestinal: Negative  for nausea and vomiting.  Genitourinary: Negative for dysuria.  Musculoskeletal: Negative for joint swelling.  Skin: Negative for rash.  Neurological: Negative for headaches.  Hematological: Does not bruise/bleed easily.  Psychiatric/Behavioral: Negative for dysphoric mood. The patient is not nervous/anxious.        Objective:   Physical Exam  GEN: A/Ox3; pleasant , NAD, elderly   HEENT:  [place]/AT,  EACs-clear, TMs-wnl, NOSE-clear, THROAT-clear, no lesions, no postnasal drip or exudate noted.   NECK:  Supple w/ fair ROM; no JVD; normal carotid impulses w/o bruits; no thyromegaly or nodules palpated; no lymphadenopathy.  RESP  Diminished BS in bases , no wheezing no accessory muscle use, no dullness to percussion  CARD:  RRR, no m/r/g  , tr peripheral edema, pulses intact, no cyanosis or clubbing.  GI:   Soft & nt; nml bowel sounds; no organomegaly or masses detected.  Musco: Warm bil, no deformities or joint swelling noted.   Neuro: alert, no focal deficits noted.    Skin: Warm, no lesions or rashes          Assessment & Plan:

## 2014-01-17 NOTE — Patient Instructions (Signed)
Continue on current regimen  Chest xray today  Remain on prednisone 5mg  daily  follow up Dr. Marchelle Gearingamaswamy in 4 weeks and .As needed   Please contact office for sooner follow up if symptoms do not improve or worsen or seek emergency care

## 2014-01-18 ENCOUNTER — Inpatient Hospital Stay: Payer: Medicare Other | Admitting: Adult Health

## 2014-01-20 NOTE — Progress Notes (Signed)
Quick Note:  Called spoke with patient, advised of cxr results / recs as stated by TP. Pt verbalized his understanding and denied any questions. ______ 

## 2014-02-06 IMAGING — CR DG CHEST 2V
3 series · 3 of 3 positions shown · non-contrast
Comparison: 11/07/2011

CLINICAL DATA: Cough and chest pain

CHEST - 2 VIEW

[w chest lat]
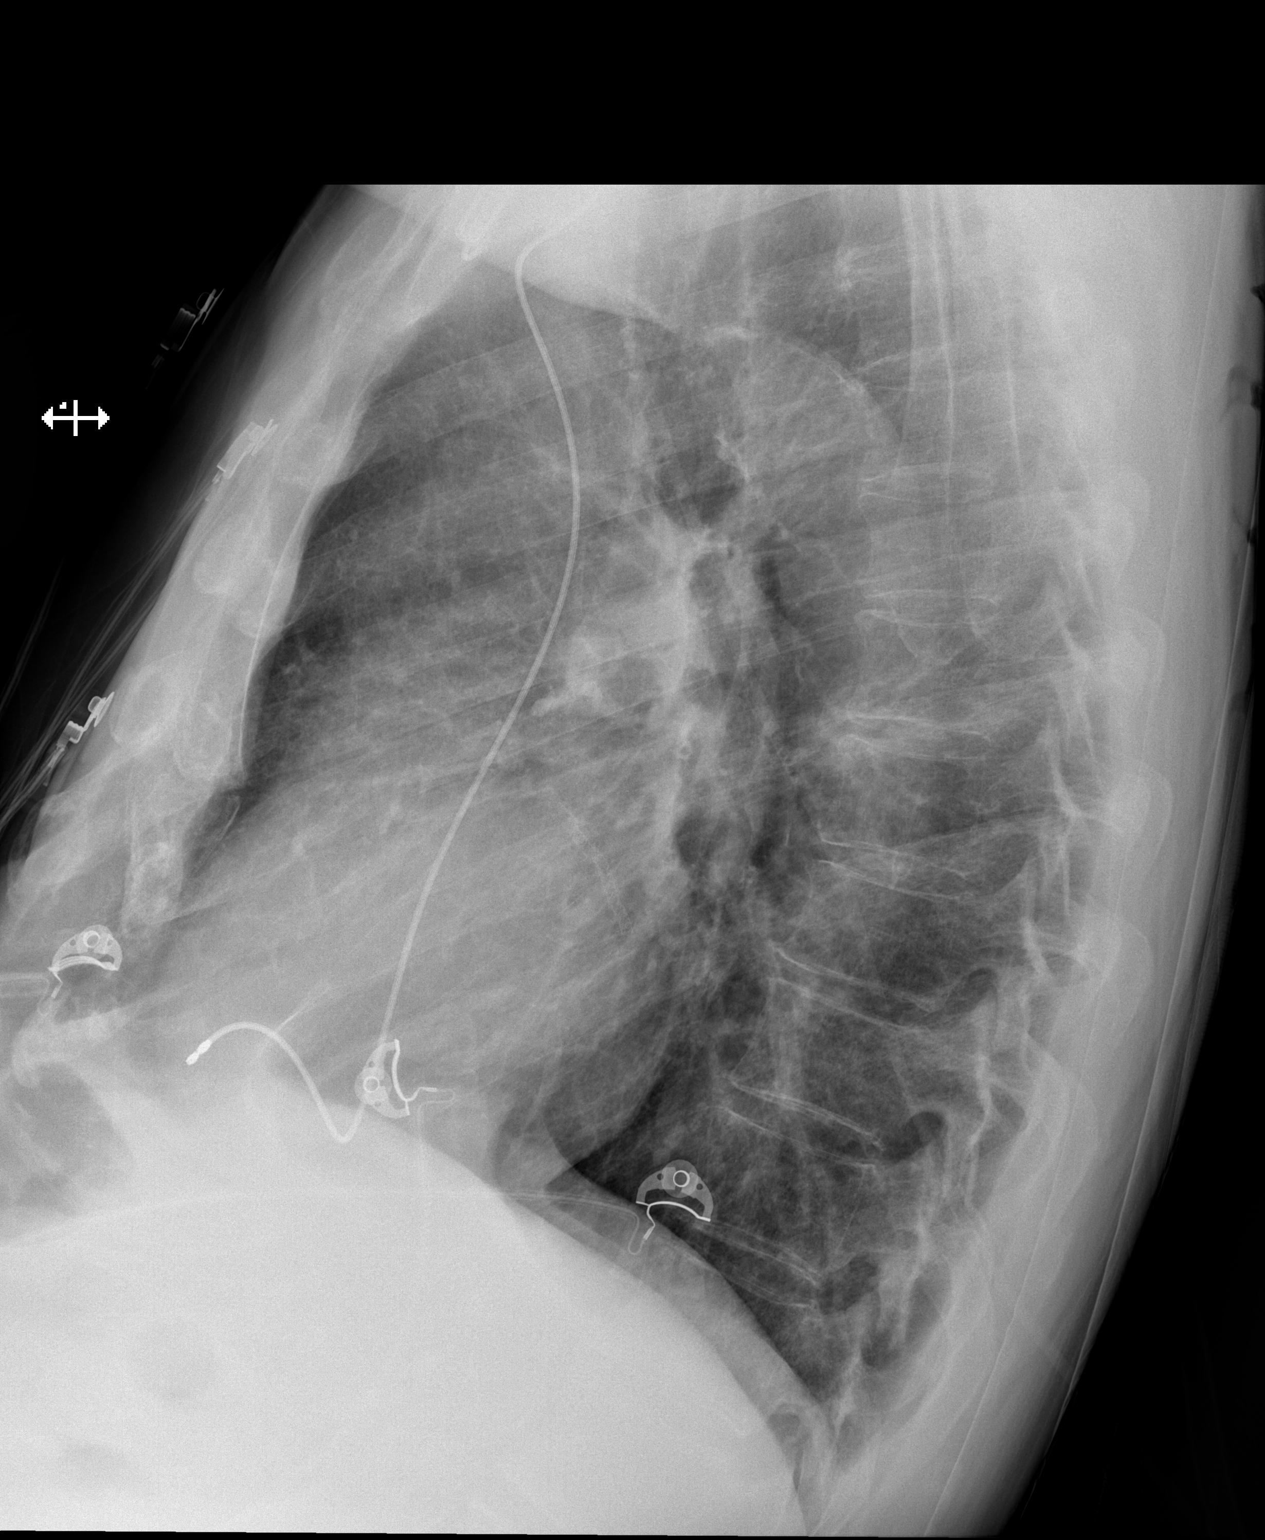

[x chest ap (1 of 2)]
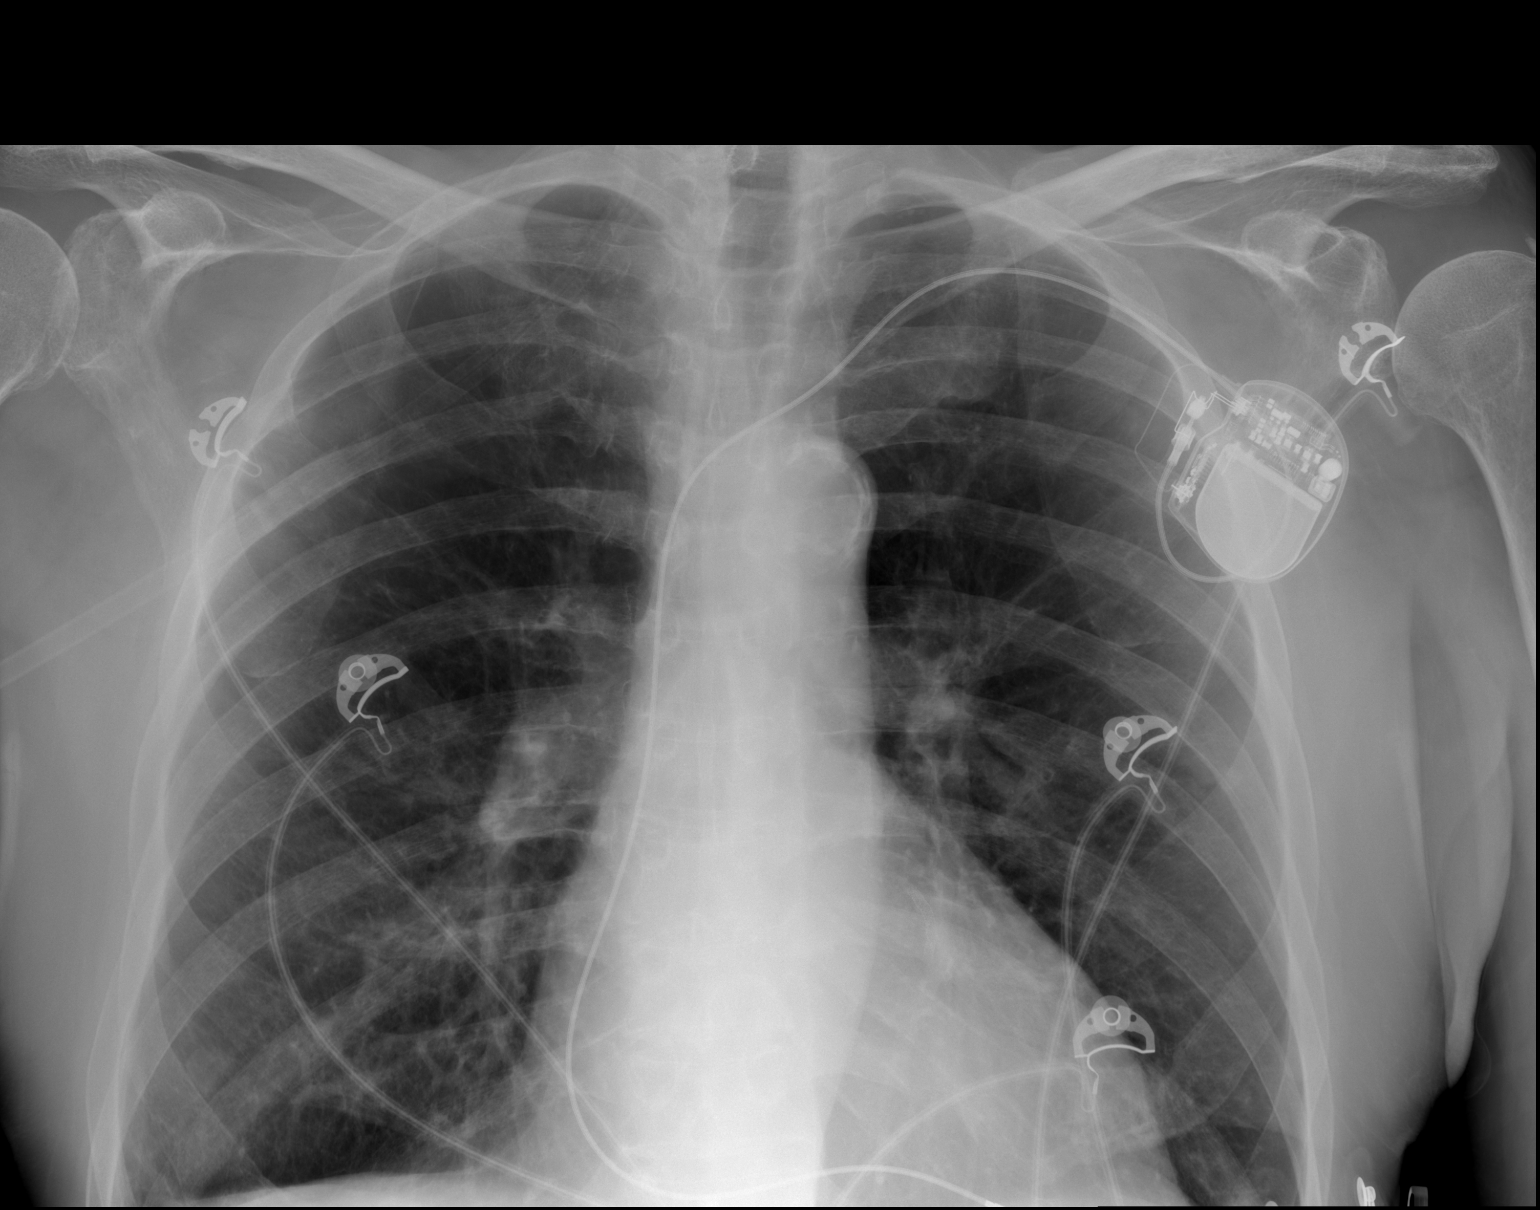

[x chest ap (2 of 2)]
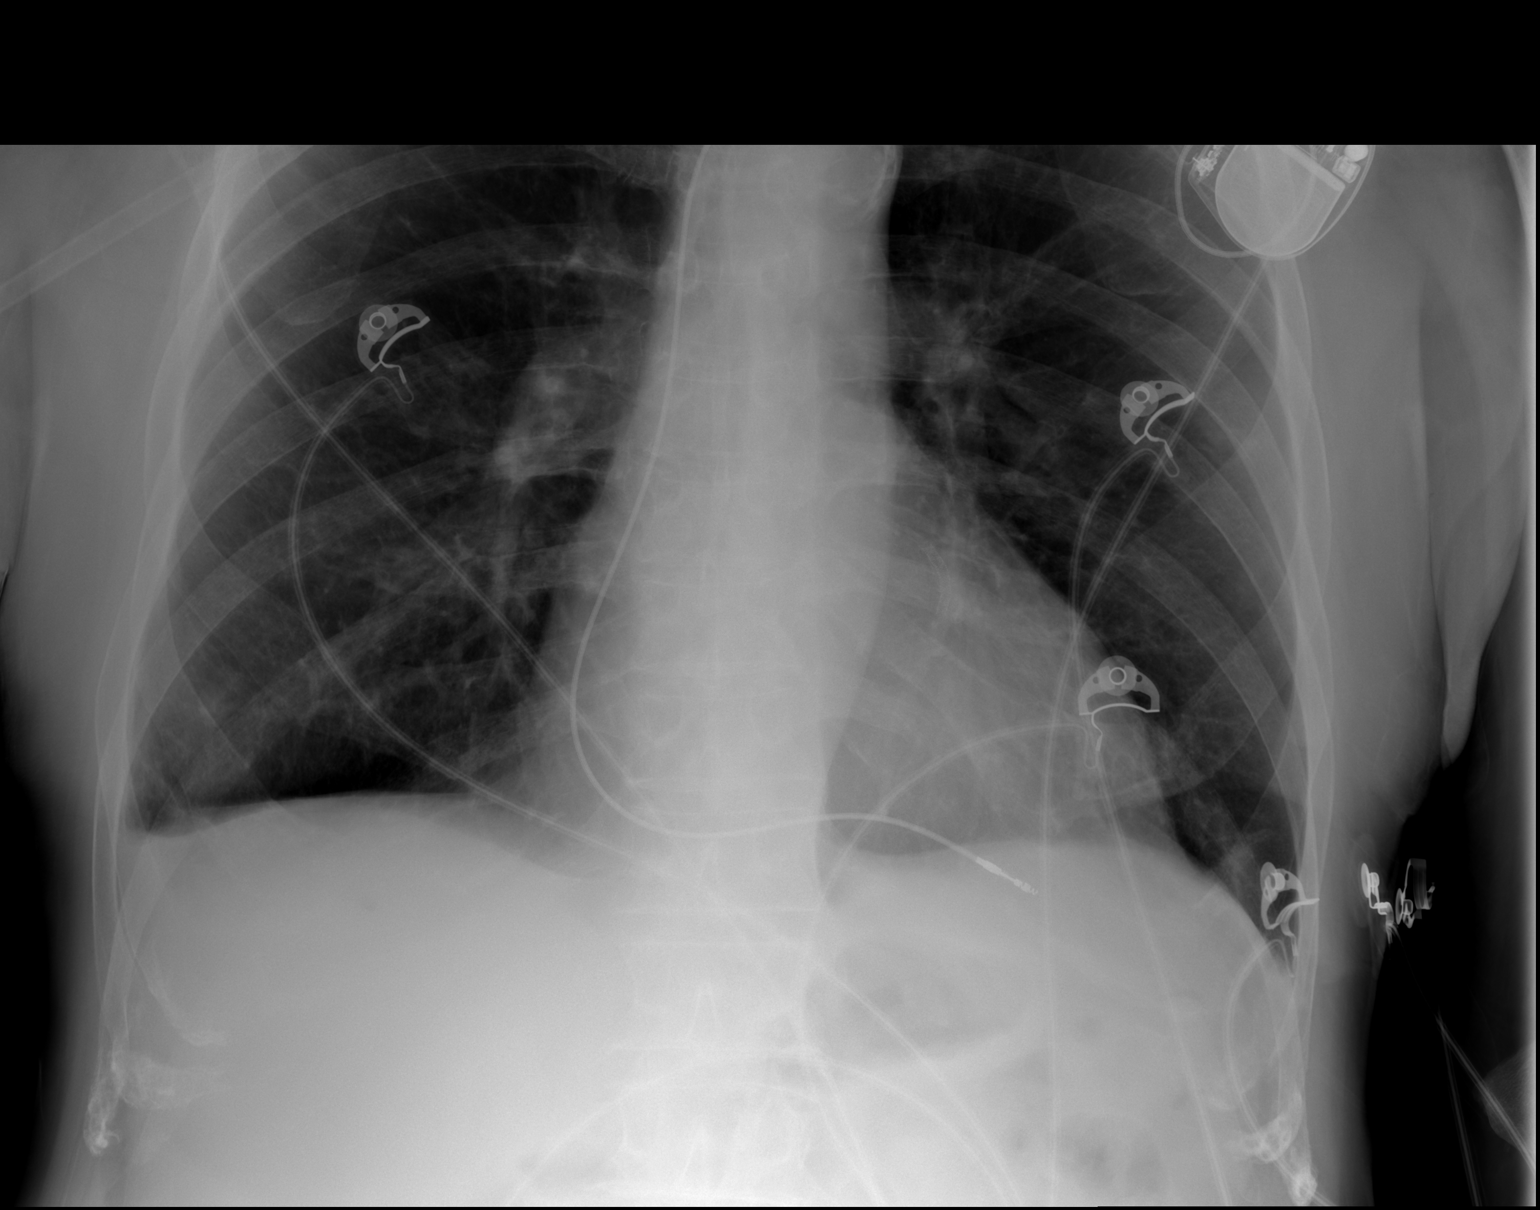

[3 of 3 positions shown; findings below may reference images not displayed]

FINDINGS: Left subclavian single lead pacemaker device and leads
are stable.  Lungs are hyperaerated without pneumothorax.  Mild
cardiomegaly.  Normal vascularity.  No pleural effusion.  Chronic
changes at the right lung base.
IMPRESSION: Cardiomegaly.  Changes related to COPD.

## 2014-02-15 ENCOUNTER — Ambulatory Visit (INDEPENDENT_AMBULATORY_CARE_PROVIDER_SITE_OTHER): Payer: Medicare Other | Admitting: Internal Medicine

## 2014-02-15 ENCOUNTER — Encounter: Payer: Self-pay | Admitting: Internal Medicine

## 2014-02-15 VITALS — BP 118/72 | HR 92 | Ht 69.0 in | Wt 175.4 lb

## 2014-02-15 DIAGNOSIS — J449 Chronic obstructive pulmonary disease, unspecified: Secondary | ICD-10-CM

## 2014-02-15 DIAGNOSIS — J4489 Other specified chronic obstructive pulmonary disease: Secondary | ICD-10-CM

## 2014-02-15 DIAGNOSIS — J439 Emphysema, unspecified: Secondary | ICD-10-CM

## 2014-02-15 DIAGNOSIS — I2510 Atherosclerotic heart disease of native coronary artery without angina pectoris: Secondary | ICD-10-CM

## 2014-02-15 DIAGNOSIS — J438 Other emphysema: Secondary | ICD-10-CM

## 2014-02-15 NOTE — Patient Instructions (Signed)
Not clear if you are in copd flare up Not sure why you have more shortness of breath Suggest your try the mucomyst neb  You have with you; try in 1-2 times only  Call us next few days anytime if you are not better or getting worsel [phone] OTherwise continue your regular medications and nebs   Followup  2 months or sooner if needed 

## 2014-02-15 NOTE — Progress Notes (Signed)
Subjective:    Patient ID: [name], male    DOB: [birth year], 80 y.o.   MRN: [id number]  HPI   OV 02/15/2014  Chief Complaint  Patient presents with  . Follow-up    Pt c/o increase in SOB with any daily activities and increase in fatigue x 1 week. C/o mild cough with clear mucous. Denies CP/tightness.     #. 75 pack smoking hx. Quit 2010. Known CAD  #. COPD - Gold stage 3-4  On atrovent, pulmicort nebs, Xopenex and daily prednisone 5 mg - PFTs 11/21/2010  - spirometry only: Fev1 0.84L/31%, Ratio 45. Gold stage 3-4 COPD Spiro 04/26/2011 - fev1 1.06L/34% CAT score 17 - 02/04/2012 Did not desaturate May 2012 185 feet x 3 laps.   3. AECOPD hx:   -March 2012 (LLL PNA),   - April 2012 - clear cxr - Acute Visit 03/05/12 - OPD Rx - June 2013 - a fib and aECopd Admit - July 2013  - for HCAP - January 2013-admitted by Dr. Lutricia Feil for COPD exacerbation. Treated in the telemetry unit. No BiPAP - March 2014: Augmentin and prednisone outpatient treatment. Start N. Acetylcysteine  #IMaging hx  - 2005 CT scan chest no evidence of lung cancer - 2014 January chest x-ray no evidence of lung cancer - 2014 March: discussed - he is not interested in screening, "I do not want to know about it"    OV 12/09/2012  COPD followup. He continues to live in [place] [facility]. He does daily exercises. In January 2014 he was admitted for COPD exacerbation. He was doing well but a few days ago developed sinusitis. His  primary care physician Dr. Lutricia Feil  started him on Augmentin on 12/04/2012. He still has a few more days of Augmentin left. He is feeling better but he feels that rate of resolution is low. He feels there is a lot of chest congestion. He had tried increasing his daily prednisone from 5 mg to 10 mg but this has not helped. COPD cat score is 25 and significantly worse than baseline; all documented below  #COPD  You have mild attack of copd called COPD exacerbation  Avoid exercises  in the middle of a COPD exacerbation  Please take prednisone 12m once daily x 3 days, then 372mdaily x 3 days, 2037mnce daily x 3 days, then 40m36mce daily x 3 days, then 5mg 21me daily to continue  Start N. acetylcysteine 600 mg twice a day  - [name] published in [employer]ropic014 shows it helps   - this is not to make you feel better but to to prevent recurrent bronchitis episodes  - You are more likely to get this drug at [facility] store then at [employer]  - You can also get this at website www.vWholesaleCream.sit functions as an anti-oxidant and there are minimal/none side effects  #Lung cancer screening - We discussed this but I respect your desire to postpone it  #Followup 4-5 months with COPD cat score at followup  OV 04/08/2013  FU COPD -- severe/very severe  Since lst OV has improved. [facility] Occ uses cane. COPD is stable. CAT score is 17 (see below) and reflects baseline activity. He is looking better an dfeeling better. Continues on duopneb q6h + qvar bid + xopnex prn + pred 5mg p75mday. Has not taken NAC. REltuctant for mdi changes due  to vA system logistics.  Only issue is recent cold and saw PMD who bumped his pred up and now back to [place]. He does not want to titrate prednisone down further; wants to remain at 23m per day   OV 10/11/2013  Chief Complaint  Patient presents with  . Follow-up    Pt just got out of the hospital friday. He had PNA. Pt c/o SOB, prod cough w/ white phlem, wheezing, chest tx.    FU COPD - Hospitalized around Christmas 2014 2 09/17/2013 under his primary care service. Apparently diagnosis was pneumonia not otherwise specified, review of imaging shows some right lower lobe infiltrate. I do not see any positive blood culture sputum culture or flu panel. [facility] Then on 10[facility]/2013 [facility] Physical therapy there is pending. He is improved from his recent hospitalization but he does feel deconditioned compared to baseline and more dyspneic compared to baseline. COPD cat score has declined back to 24. Is compliant with his nebulizers. He says that while in the hospital he was given some nebulizers Mucomyst that really helped bring out his sputum and he wants to give this a try. I've warned him about the paradoxical bronchospasm so he will try to judicious amounts  REC Glad you are better but understand you still have some way to go [facility], O2 and other nebs To help mucus clearance  - You can try mucomyst nebulizer Acetylcysteine 10% solution, 6 mL until nebulized. Do not try more than 4 times each month because it can make wheezing worse while trying to get rid of your sputum  FOllowup  3 months or sooner CAT score at followup  OV 11/08/2013  Chief Complaint  Patient presents with  . Acute Visit    Pt states Thursday he has a stomach virus and this made him weak. He  states since then he has been having increased SOB, chest congestion, productive cough wiht yellow phlegm.    Acute visit. Was feeling baseline. But on 11/04/2013 had some nausea with vomiting and later in the day diarrhea. But both resolved by end of the day 11/04/2013. Then on 11/05/2013 started noticing dyspnea on exertion that was worse than baseline. This is moderate in severity. Relieved by rest. Has been stable to slightly worse over the next few days. However today started having increasing cough compared to baseline and slight change in color of sputum to thick yellow. And also increased sputum volume.  He is frustrated that he does not have a new neb machine despite uKoreaputting in order. Also his Mucomyst nebulizers have not come through despite uKoreatalking to pharmacy and arranging 20% solution.   [facility]  [facility]  [facility] for [facility]  hospital follow up .  Admitted 4/17-4/23 for COPD exacerbation, acute on chronic combined systolic and diastolic heart failure , and PNA .  Patient was treated with IV antibiotics, steroids, and nebulized bronchodilators. He did require BiPAP support briefly. Improved with diuresis. Chest x-ray prior to discharge showed improved right sided lung infiltrates.  Since discharge. Patient is improved with decreased cough, congestion. Patient does feel weak, and has not regained his energy level. Yet. He denies any hemoptysis, chest pain, orthopnea, PND, or leg swelling Patient has tapered his prednisone to 5 mg daily.   OV 02/15/2014  Chief Complaint  Patient presents with  . Follow-up  Pt c/o increase in SOB with any daily activities and increase in fatigue x 1 week. C/o mild cough with clear mucous. Denies CP/tightness.    Followup severe COPD and multiple exacerbation  - Overall he is stable. He continues prednisone 5 mg per day. He is also on amiodarone and digoxin and bisoprolol for his cardiac issues along with Cardizem for atrial fibrillation.  COPD medications include DuoNeb. He is not on oxygen. He is also on oral morphine as needed for dyspnea. Morphine was started at the time of prior hospitalization and at discharge. Overall he is stable. He is quite functional.  Today he noticed increased dyspnea somewhat worse compared to baseline but without any associated worsening of cough, changes due to production or chest pain no orthopnea or paroxysmal nocturnal dyspnea or edema. COPD cat score is 24 and similar to baseline I suspect. Of note, sometime back I prescribed N-acetylcysteine mist at his request  but he has never tried this. He wants to try this today was dyspnea   CAT COPD Symptom & Quality of Life Score (GSK trademark) 0 is no burden. 5 is highest burden 02/15/2014   Never Cough -> Cough all the time 1  No phlegm in chest -> Chest is full of phlegm 2  No chest tightness -> Chest  feels very tight 0  No dyspnea for 1 flight stairs/hill -> Very dyspneic for 1 flight of stairs 5  No limitations for ADL at home -> Very limited with ADL at home 5  Confident leaving home -> Not at all confident leaving home 3  Sleep soundly -> Do not sleep soundly because of lung condition 3  Lots of Energy -> No energy at all 5  TOTAL Score (max 40)  24   Past, Family, Social reviewed: no change since last visit    Review of Systems  Constitutional: Negative for fever and unexpected weight change.  HENT: Negative for congestion, dental problem, ear pain, nosebleeds, postnasal drip, rhinorrhea, sinus pressure, sneezing, sore throat and trouble swallowing.   Eyes: Negative for redness and itching.  Respiratory: Positive for cough and shortness of breath. Negative for chest tightness and wheezing.   Cardiovascular: Negative for palpitations and leg swelling.  Gastrointestinal: Negative for nausea and vomiting.  Genitourinary: Negative for dysuria.  Musculoskeletal: Negative for joint swelling.  Skin: Negative for rash.  Neurological: Negative for headaches.  Hematological: Does not bruise/bleed easily.  Psychiatric/Behavioral: Negative for dysphoric mood. The patient is not nervous/anxious.        Objective:   Physical Exam  Filed Vitals:   02/15/14 1455  Height: 5\' 9"  (1.753 m)  Weight: 175 lb 6.4 oz (79.561 kg)   Filed Vitals:   02/15/14 1455  BP: 118/72  Pulse: 92  Height: 5\' 9"  (1.753 m)  Weight: 175 lb 6.4 oz (79.561 kg)  SpO2: 97%    GEN: A/Ox3; pleasant , NAD, elderly   HEENT:  [place]/AT,  EACs-clear, TMs-wnl, NOSE-clear, THROAT-clear, no lesions, no postnasal drip or exudate noted.   NECK:  Supple w/ fair ROM; no JVD; normal carotid impulses w/o bruits; no thyromegaly or nodules palpated; no lymphadenopathy.  RESP  Diminished BS in bases , no wheezing no accessory muscle use, no dullness to percussion  CARD:  RRR, no m/r/g  , tr peripheral edema, pulses  intact, no cyanosis or clubbing.  GI:   Soft & nt; nml bowel sounds; no organomegaly or masses detected.  Musco: Warm bil, no deformities or joint swelling noted.  Neuro: alert, no focal deficits noted.    Skin: Warm, no lesions or rashes        Assessment & Plan:  Not clear if you are in copd flare up Not sure why you have more shortness of breath Suggest your try the mucomyst neb  You have with you; try in 1-2 times only  Call us next few days anytime if you are not better or getting worsel [phone] OTherwise continue your regular medications and nebs   Followup  2 months or sooner if needed

## 2014-02-17 ENCOUNTER — Telehealth: Payer: Self-pay | Admitting: Internal Medicine

## 2014-02-17 NOTE — Telephone Encounter (Signed)
Pt returned call

## 2014-02-17 NOTE — Telephone Encounter (Signed)
LMOM x 1 

## 2014-02-17 NOTE — Telephone Encounter (Signed)
Spoke with pt- aware to stop Mucomyst Per pt -  Dyspnea is somewhat increased--no more than it was at his most recent OV Mucus is clear Cough unchanged Denies chest pain, edema, orthopnea.  Please advise Dr Marchelle Gearing. Thanks.

## 2014-02-17 NOTE — Telephone Encounter (Signed)
Spoke with patient States that Mucamyst does not seem to be helping. Seems to be making him worse. Pt reports chest tightness/pressure with every use.  Pt states that he does not remember having problems with this medication in the hospital when it was given to him. Pt reports taking regular Duoneb about 2-3 hours after the Mucamyst and it seemed to alleviate his chest tightness and calm everything down. Pt reports feeling much better after this treatment.  Pt aware that MR not in office--working in hospital today. We will page him and call him back with a response. Please advise Dr Marchelle Gearing. Thanks.

## 2014-02-17 NOTE — Telephone Encounter (Signed)
Ok do not take mucomyst  In office he told me dyspnea worse but no change in cough or sputum and no fever or chills or edema or chest pain or orthopnea: Is theere any difference in these ? IS he feeling he is in AECOPD? Please ask him and let me know because I decided not to Rx him for AECOPD but just watch ?

## 2014-02-17 NOTE — Telephone Encounter (Signed)
Continue to watch. IF no better or anything worse next 1-3 days to call us anytime 24/7.  Thanks  Dr. Kalman Shan, M.D., [facility].C.P Pulmonary and Critical Care Medicine Staff Physician [facility] [place] [facility] and Critical Care Pager: [phone], If no answer or between  15:00h - 7:00h: call [phone]  02/17/2014 12:31 PM

## 2014-02-17 NOTE — Telephone Encounter (Signed)
Pt aware.  Will contact our office if anything further needed.   Nothing further needed.

## 2014-02-18 ENCOUNTER — Ambulatory Visit (INDEPENDENT_AMBULATORY_CARE_PROVIDER_SITE_OTHER): Payer: Medicare Other | Admitting: Cardiology

## 2014-02-18 ENCOUNTER — Encounter: Payer: Self-pay | Admitting: Cardiology

## 2014-02-18 VITALS — BP 130/69 | HR 64 | Ht 69.0 in | Wt 173.0 lb

## 2014-02-18 DIAGNOSIS — J449 Chronic obstructive pulmonary disease, unspecified: Secondary | ICD-10-CM

## 2014-02-18 DIAGNOSIS — I472 Ventricular tachycardia, unspecified: Secondary | ICD-10-CM

## 2014-02-18 DIAGNOSIS — I4821 Permanent atrial fibrillation: Secondary | ICD-10-CM

## 2014-02-18 DIAGNOSIS — I5042 Chronic combined systolic (congestive) and diastolic (congestive) heart failure: Secondary | ICD-10-CM

## 2014-02-18 DIAGNOSIS — I509 Heart failure, unspecified: Secondary | ICD-10-CM

## 2014-02-18 DIAGNOSIS — I2510 Atherosclerotic heart disease of native coronary artery without angina pectoris: Secondary | ICD-10-CM

## 2014-02-18 DIAGNOSIS — I4891 Unspecified atrial fibrillation: Secondary | ICD-10-CM

## 2014-02-18 DIAGNOSIS — I5043 Acute on chronic combined systolic (congestive) and diastolic (congestive) heart failure: Secondary | ICD-10-CM

## 2014-02-18 DIAGNOSIS — I4729 Other ventricular tachycardia: Secondary | ICD-10-CM

## 2014-02-18 NOTE — Assessment & Plan Note (Signed)
The patient has not had a recurrent chest pain or angina

## 2014-02-18 NOTE — Progress Notes (Signed)
[name] Date of Birth:  May 14, 1928 [facility] [address] [place], [place]  [address] [phone]        Fax   [phone]   History of Present Illness: This pleasant 80 year old gentleman is seen for a scheduled 4 month followup office visit. He has a past history of severe COPD and has had multiple hospitalizations in the past for exacerbation of COPD and exacerbation of diastolic congestive heart failure.he was recently hospitalized in October 2014 for exacerbation of COPD. he was hospitalized again several months ago for a similar problem of acute exacerbation of his COPD. He has a past history of acute on chronic combined systolic and diastolic heart failure and a past history of atrial fibrillation with rapid ventricular response. He has a functioning ventricular pacemaker and is followed by Dr. Ladona Ridgel. Since last visit he has been doing well. His breathing is much improved and he is not on home oxygen. He walks from his room to the dining hall with a walker.  Recently the [facility] switched him from Pradaxa to apixaban for his atrial fibrillation.  Current Outpatient Prescriptions  Medication Sig Dispense Refill  . acetylcysteine (MUCOMYST) 10 % nebulizer solution 6 ML until nebulized. Do not use more than 4 times each month  30 mL  1  . albuterol (PROVENTIL) (2.5 MG/3ML) 0.083% nebulizer solution Take 2.5 mg by nebulization every 6 (six) hours as needed for wheezing or shortness of breath.      Marland Kitchen amiodarone (PACERONE) 200 MG tablet Take 1 tablet (200 mg total) by mouth daily.  30 tablet  1  . apixaban (ELIQUIS) 5 MG TABS tablet Take 5 mg by mouth 2 (two) times daily.      . beclomethasone (QVAR) 80 MCG/ACT inhaler Inhale 1 puff into the lungs 2 (two) times daily.      . bisoprolol (ZEBETA) 10 MG tablet Take 1 tablet (10 mg total) by mouth daily.  30 tablet  3  . cholecalciferol (VITAMIN D) 1000 UNITS tablet Take 2,000 Units by mouth daily.        . digoxin (LANOXIN) 0.125 MG tablet Take 0.125 mg by mouth daily.      Marland Kitchen diltiazem (DILACOR XR) 240 MG 24 hr capsule Take 240 mg by mouth daily.      . finasteride (PROSCAR) 5 MG tablet Take 5 mg by mouth daily.      . furosemide (LASIX) 20 MG tablet Take 20 mg by mouth daily. TAKE one daily      . glimepiride (AMARYL) 2 MG tablet Take 1 tablet (2 mg total) by mouth daily with breakfast.  30 tablet  6  . guaiFENesin (MUCINEX) 600 MG 12 hr tablet Take 600 mg by mouth at bedtime.       Marland Kitchen ipratropium (ATROVENT) 0.02 % nebulizer solution Take 2.5 mLs (0.5 mg total) by nebulization every 6 (six) hours.  75 mL  12  . isosorbide mononitrate (IMDUR) 60 MG 24 hr tablet Take 60 mg by mouth daily.        Marland Kitchen levalbuterol (XOPENEX) 1.25 MG/0.5ML nebulizer solution Take 1.25 mg by nebulization every 6 (six) hours.  1 each  12  . morphine 10 MG/5ML solution Take 2 mLs (4 mg total) by mouth 2 (two) times daily as needed (refractory dyspnea).  120 mL  0  . nitroGLYCERIN (NITROSTAT) 0.4 MG SL tablet Place 0.4 mg under the tongue every 5 (five) minutes as needed. For chest pain      .  predniSONE (DELTASONE) 10 MG tablet Take 5 mg by mouth daily.      . rosuvastatin (CRESTOR) 40 MG tablet Take 40 mg by mouth daily.      . sennosides-docusate sodium (SENOKOT-S) 8.6-50 MG tablet Take 2 tablets by mouth at bedtime.       . Tamsulosin HCl (FLOMAX) 0.4 MG CAPS Take 0.4 mg by mouth daily.       No current facility-administered medications for this visit.    No Known Allergies  Patient Active Problem List   Diagnosis Date Noted  . Atrial fibrillation 10/06/2013  . Hypertension 10/06/2013  . COPD with acute exacerbation 09/09/2013  . Ventricular tachycardia 06/15/2013  . Cancer screening, for lung 12/12/2012  . Chronic combined systolic and diastolic congestive heart failure 09/28/2012  . Leg weakness, bilateral 04/13/2012  . E-coli UTI 04/09/2012  . Hyponatremia 04/09/2012  . Chronic anticoagulation 04/04/2012     Class: Chronic  . Depression 03/16/2012    Class: Chronic  . Type 2 diabetes mellitus 03/11/2012  . Chest pain 03/11/2012  . COPD (chronic obstructive pulmonary disease) 03/11/2012  . Permanent atrial fibrillation 03/11/2012  . Hypoxemia 03/10/2012  . Bronchitis 03/10/2012  . Pulmonary edema 03/10/2012  . Acute-on-chronic respiratory failure 11/15/2011  . Acute on chronic combined systolic and diastolic heart failure 11/11/2011  . Spiritual Distress 11/08/2011  . COPD exacerbation 11/07/2011  . Dyspnea 08/27/2011  . HLD (hyperlipidemia) 08/27/2011  . NSTEMI (non-ST elevated myocardial infarction) 08/15/2011  . CAD (coronary artery disease) 08/15/2011  . Unstable angina 08/13/2011  . Post-nasal drip 04/26/2011  . Pacemaker-St.Jude 04/02/2011  . CHF (congestive heart failure) 12/26/2010  . Abdominal aortic aneurysm 12/26/2010  . Emphysema/COPD 12/26/2010    History  Smoking status  . Former Smoker -- 2.00 packs/day for 50 years  . Types: Cigarettes  . Quit date: 09/02/2009  Smokeless tobacco  . Never Used    Comment: smoked for 3937yrs quit for 1041yrs started back & smoked for 10ys    History  Alcohol Use  . Yes    Comment: occ - alcohol    Family History  Problem Relation Age of Onset  . Heart attack Mother [id number]  . Heart attack Father [id number]  . Cancer Other     siblings  . Heart attack Other     Review of Systems: Constitutional: no fever chills diaphoresis or fatigue or change in weight.  Head and neck: no hearing loss, no epistaxis, no photophobia or visual disturbance. Respiratory: No cough, shortness of breath or wheezing. Cardiovascular: No chest pain peripheral edema, palpitations. Gastrointestinal: No abdominal distention, no abdominal pain, no change in bowel habits hematochezia or melena. Genitourinary: No dysuria, no frequency, no urgency, no nocturia. Musculoskeletal:No arthralgias, no back pain, no gait disturbance or myalgias. Neurological: No  dizziness, no headaches, no numbness, no seizures, no syncope, no weakness, no tremors. Hematologic: No lymphadenopathy, no easy bruising. Psychiatric: No confusion, no hallucinations, no sleep disturbance.    Physical Exam: Filed Vitals:   02/18/14 1350  BP: 130/69  Pulse: 64   the general appearance reveals a well-developed well-nourished gentleman in no distress.The head and neck exam reveals pupils equal and reactive.  Extraocular movements are full.  There is no scleral icterus.  The mouth and pharynx are normal.  The neck is supple.  The carotids reveal no bruits.  The jugular venous pressure is normal.  The  thyroid is not enlarged.  There is no lymphadenopathy.  The chest is clear to percussion and  auscultation.  There is increased AP diameter consistent with COPD.  The breath sounds are generally distant  There are no rales or rhonchi.  Expansion of the chest is symmetrical.  The precordium is quiet.  The first heart sound is normal.  The second heart sound is physiologically split.  There is no murmur gallop rub or click.  There is no abnormal lift or heave.  The abdomen is soft and nontender.  The bowel sounds are normal.  The liver and spleen are not enlarged.  There are no abdominal masses.  There are no abdominal bruits.  Extremities reveal good pedal pulses.  There is no phlebitis or edema.  There is no cyanosis or clubbing.  Strength is normal and symmetrical in all extremities.  There is no lateralizing weakness.  There are no sensory deficits.  The skin is warm and dry.  There is no rash.     Assessment / Plan: 1. COPD 2. permanent atrial fibrillation. 3. past history of paroxysmal ventricular tachycardia on amiodarone 4.  Chronic combined systolic and diastolic congestive heart failure 5. ischemic heart disease with prior non-STEMI. 6.  Functioning ventricular pacemaker followed by Dr. Ladona Ridgel  Disposition: Continue on same medication.  Recheck in 6 months for followup  office visit.

## 2014-02-18 NOTE — Assessment & Plan Note (Signed)
At the present time the patient is clinically euvolemic.  No peripheral edema.  His weight is up 3 pounds since last visit

## 2014-02-18 NOTE — Patient Instructions (Signed)
Your physician recommends that you continue on your current medications as directed. Please refer to the Current Medication list given to you today.  Your physician wants you to follow-up in: 6 month ov You will receive a reminder letter in the mail two months in advance. If you don't receive a letter, please call our office to schedule the follow-up appointment.  

## 2014-02-18 NOTE — Assessment & Plan Note (Signed)
The patient remains in atrial fibrillation.  He has not had any TIA symptoms.  He has an underlying ventricular paced rhythm

## 2014-02-21 ENCOUNTER — Other Ambulatory Visit: Payer: Self-pay

## 2014-02-25 NOTE — Assessment & Plan Note (Signed)
Not clear if you are in copd flare up Not sure why you have more shortness of breath Suggest your try the mucomyst neb  You have with you; try in 1-2 times only  Call us next few days anytime if you are not better or getting worsel [phone] OTherwise continue your regular medications and nebs   Followup  2 months or sooner if needed

## 2014-03-14 ENCOUNTER — Telehealth: Payer: Self-pay | Admitting: Internal Medicine

## 2014-03-14 MED ORDER — CEFDINIR 300 MG PO CAPS: 300.0000 mg | ORAL_CAPSULE | Freq: Two times a day (BID) | ORAL | Status: DC

## 2014-03-14 MED ORDER — PREDNISONE 10 MG PO TABS: ORAL_TABLET | ORAL | Status: DC

## 2014-03-14 NOTE — Telephone Encounter (Signed)
aecopd due to summer heat/virus  Take prednisone 40 mg daily x 2 days, then 20mg  daily x 2 days, then 10mg  daily x 2 days, then 5mg  daily  and continue  omnicef 300mg  twiced daly x 5 days   Thanks  Dr. Kalman ShanMurali Ramaswamy, M.D., [facility].C.P Pulmonary and Critical Care Medicine Staff Physician [facility] [place] Pulmonary and Critical Care Pager: [phone], If no answer or between  15:00h - 7:00h: call [phone]  03/14/2014 2:11 PM

## 2014-03-14 NOTE — Telephone Encounter (Signed)
Called spoke with patient, advised of MR's recs as stated below.  Pt verbalized his understanding and denied any questions/concerns.  Pt to call the office again if his symptoms do not improve or they worsen.  Rx's sent to verified pharmacy.  Nothing further needed at this time; will sign off.

## 2014-03-14 NOTE — Telephone Encounter (Signed)
Last ov w/ MR 6.2.15: Patient Instructions     Not clear if you are in copd flare up  Not sure why you have more shortness of breath  Suggest your try the mucomyst neb You have with you; try in 1-2 times only  Call us next few days anytime if you are not better or getting worsel [phone]  OTherwise continue your regular medications and nebs  Followup  2 months or sooner if needed   Called spoke with patient who reports some weakness/fatigue yesterday 6.29.15 and then woke up this morning w/ prod cough with yellow mucus, sore throat and hoarseness, some head congestion but no drainage.  Pt denies any wheezing, dyspnea, tightness, f/c/s/n/v, hemoptysis.  [facility]    MR please advise, thank you!  No Known Allergies - verified.

## 2014-03-22 ENCOUNTER — Telehealth: Payer: Self-pay | Admitting: Internal Medicine

## 2014-03-22 MED ORDER — AZITHROMYCIN 250 MG PO TABS: ORAL_TABLET | ORAL | Status: DC

## 2014-03-22 MED ORDER — PREDNISONE 10 MG PO TABS: ORAL_TABLET | ORAL | Status: DC

## 2014-03-22 NOTE — Telephone Encounter (Signed)
Only on 03/14/14:I called in pred and abx for him.   He needs to be seen this week or next week by MD/NP (Not me, I am fully booked)  Please ask him if interested I can get him into a research study so we can keep closer tabs on his COPD  For now, for unresolved aecopd   - call in ZPAK  - Please take prednisone 40 mg x1 day, then 30 mg x1 day, then 20 mg x1 day, then 10 mg x1 day, and then 5 mg x1 day and stop - if worse go to ER

## 2014-03-22 NOTE — Telephone Encounter (Signed)
Called and spoke with pt and he stated that he is feeling tired all the time, increase in congestion, cough with grey/yellow sputum.  Pt also stated that his eyes are very itchy and are burning and he has been using OTC eye drops and this is not helping.  Pt is requesting that something be called into his pharmacy.  MR please advise.  Thanks  No Known Allergies  Current Outpatient Prescriptions on File Prior to Visit  Medication Sig Dispense Refill  . acetylcysteine (MUCOMYST) 10 % nebulizer solution 6 ML until nebulized. Do not use more than 4 times each month  30 mL  1  . albuterol (PROVENTIL) (2.5 MG/3ML) 0.083% nebulizer solution Take 2.5 mg by nebulization every 6 (six) hours as needed for wheezing or shortness of breath.      Marland Kitchen. amiodarone (PACERONE) 200 MG tablet Take 1 tablet (200 mg total) by mouth daily.  30 tablet  1  . apixaban (ELIQUIS) 5 MG TABS tablet Take 5 mg by mouth 2 (two) times daily.      . beclomethasone (QVAR) 80 MCG/ACT inhaler Inhale 1 puff into the lungs 2 (two) times daily.      . bisoprolol (ZEBETA) 10 MG tablet Take 1 tablet (10 mg total) by mouth daily.  30 tablet  3  . cefdinir (OMNICEF) 300 MG capsule Take 1 capsule (300 mg total) by mouth 2 (two) times daily. for 5 days  10 capsule  0  . cholecalciferol (VITAMIN D) 1000 UNITS tablet Take 2,000 Units by mouth daily.      . digoxin (LANOXIN) 0.125 MG tablet Take 0.125 mg by mouth daily.      Marland Kitchen. diltiazem (DILACOR XR) 240 MG 24 hr capsule Take 240 mg by mouth daily.      . finasteride (PROSCAR) 5 MG tablet Take 5 mg by mouth daily.      . furosemide (LASIX) 20 MG tablet Take 20 mg by mouth daily. TAKE one daily      . glimepiride (AMARYL) 2 MG tablet Take 1 tablet (2 mg total) by mouth daily with breakfast.  30 tablet  6  . guaiFENesin (MUCINEX) 600 MG 12 hr tablet Take 600 mg by mouth at bedtime.       Marland Kitchen. ipratropium (ATROVENT) 0.02 % nebulizer solution Take 2.5 mLs (0.5 mg total) by nebulization every 6 (six) hours.   75 mL  12  . isosorbide mononitrate (IMDUR) 60 MG 24 hr tablet Take 60 mg by mouth daily.        Marland Kitchen. levalbuterol (XOPENEX) 1.25 MG/0.5ML nebulizer solution Take 1.25 mg by nebulization every 6 (six) hours.  1 each  12  . morphine 10 MG/5ML solution Take 2 mLs (4 mg total) by mouth 2 (two) times daily as needed (refractory dyspnea).  120 mL  0  . nitroGLYCERIN (NITROSTAT) 0.4 MG SL tablet Place 0.4 mg under the tongue every 5 (five) minutes as needed. For chest pain      . predniSONE (DELTASONE) 10 MG tablet Take 5 mg by mouth daily.      . predniSONE (DELTASONE) 10 MG tablet 4 tabs by mouth x2 days, then 2 tabs x2 days, 1 tab x2 days then resume 5mg  daily  14 tablet  0  . rosuvastatin (CRESTOR) 40 MG tablet Take 40 mg by mouth daily.      . sennosides-docusate sodium (SENOKOT-S) 8.6-50 MG tablet Take 2 tablets by mouth at bedtime.       . Tamsulosin HCl (  FLOMAX) 0.4 MG CAPS Take 0.4 mg by mouth daily.       No current facility-administered medications on file prior to visit.

## 2014-03-22 NOTE — Telephone Encounter (Signed)
Called and spoke to pt. Informed him of recs per MR. Rx sent to preferred pharmacy. Pt stated he did not want to be in the research trials right now but will let us know if he changes his mind. Pt aware to go to ER if symptoms worsen. Appt made for pt with TP on 7/16 at 12pm, appt time and date confirmed with pt. Nothing further needed.

## 2014-03-27 ENCOUNTER — Inpatient Hospital Stay (HOSPITAL_COMMUNITY)
Admission: EM | Admit: 2014-03-27 | Discharge: 2014-03-29 | DRG: 190 | Disposition: A | Discharge status: Home / Self Care | Payer: Medicare Other | Attending: Internal Medicine | Admitting: Internal Medicine

## 2014-03-27 ENCOUNTER — Emergency Department (HOSPITAL_COMMUNITY): Payer: Medicare Other

## 2014-03-27 ENCOUNTER — Encounter (HOSPITAL_COMMUNITY): Payer: Self-pay | Admitting: Emergency Medicine

## 2014-03-27 DIAGNOSIS — Z87891 Personal history of nicotine dependence: Secondary | ICD-10-CM

## 2014-03-27 DIAGNOSIS — I2510 Atherosclerotic heart disease of native coronary artery without angina pectoris: Secondary | ICD-10-CM | POA: Diagnosis present

## 2014-03-27 DIAGNOSIS — I2589 Other forms of chronic ischemic heart disease: Secondary | ICD-10-CM | POA: Diagnosis present

## 2014-03-27 DIAGNOSIS — E785 Hyperlipidemia, unspecified: Secondary | ICD-10-CM | POA: Diagnosis present

## 2014-03-27 DIAGNOSIS — Z7901 Long term (current) use of anticoagulants: Secondary | ICD-10-CM

## 2014-03-27 DIAGNOSIS — Z66 Do not resuscitate: Secondary | ICD-10-CM | POA: Diagnosis present

## 2014-03-27 DIAGNOSIS — I739 Peripheral vascular disease, unspecified: Secondary | ICD-10-CM | POA: Diagnosis present

## 2014-03-27 DIAGNOSIS — J9620 Acute and chronic respiratory failure, unspecified whether with hypoxia or hypercapnia: Secondary | ICD-10-CM | POA: Diagnosis present

## 2014-03-27 DIAGNOSIS — K219 Gastro-esophageal reflux disease without esophagitis: Secondary | ICD-10-CM | POA: Diagnosis present

## 2014-03-27 DIAGNOSIS — J441 Chronic obstructive pulmonary disease with (acute) exacerbation: Secondary | ICD-10-CM | POA: Diagnosis present

## 2014-03-27 DIAGNOSIS — Z9861 Coronary angioplasty status: Secondary | ICD-10-CM

## 2014-03-27 DIAGNOSIS — I509 Heart failure, unspecified: Secondary | ICD-10-CM | POA: Diagnosis present

## 2014-03-27 DIAGNOSIS — IMO0002 Reserved for concepts with insufficient information to code with codable children: Secondary | ICD-10-CM | POA: Diagnosis not present

## 2014-03-27 DIAGNOSIS — E119 Type 2 diabetes mellitus without complications: Secondary | ICD-10-CM | POA: Diagnosis present

## 2014-03-27 DIAGNOSIS — I10 Essential (primary) hypertension: Secondary | ICD-10-CM | POA: Diagnosis present

## 2014-03-27 DIAGNOSIS — E7800 Pure hypercholesterolemia, unspecified: Secondary | ICD-10-CM | POA: Diagnosis present

## 2014-03-27 DIAGNOSIS — I252 Old myocardial infarction: Secondary | ICD-10-CM | POA: Diagnosis not present

## 2014-03-27 DIAGNOSIS — I5043 Acute on chronic combined systolic (congestive) and diastolic (congestive) heart failure: Secondary | ICD-10-CM | POA: Diagnosis present

## 2014-03-27 DIAGNOSIS — Z950 Presence of cardiac pacemaker: Secondary | ICD-10-CM

## 2014-03-27 DIAGNOSIS — T380X5A Adverse effect of glucocorticoids and synthetic analogues, initial encounter: Secondary | ICD-10-CM | POA: Diagnosis present

## 2014-03-27 DIAGNOSIS — M1990 Unspecified osteoarthritis, unspecified site: Secondary | ICD-10-CM | POA: Diagnosis present

## 2014-03-27 DIAGNOSIS — R0602 Shortness of breath: Secondary | ICD-10-CM | POA: Diagnosis not present

## 2014-03-27 DIAGNOSIS — I4821 Permanent atrial fibrillation: Secondary | ICD-10-CM | POA: Diagnosis present

## 2014-03-27 DIAGNOSIS — I4891 Unspecified atrial fibrillation: Secondary | ICD-10-CM | POA: Diagnosis present

## 2014-03-27 DIAGNOSIS — Z96649 Presence of unspecified artificial hip joint: Secondary | ICD-10-CM | POA: Diagnosis not present

## 2014-03-27 DIAGNOSIS — Z8249 Family history of ischemic heart disease and other diseases of the circulatory system: Secondary | ICD-10-CM

## 2014-03-27 LAB — CBC
HCT: 41.9 % (ref 39.0–52.0)
Hemoglobin: 13.3 g/dL (ref 13.0–17.0)
MCH: 29.8 pg (ref 26.0–34.0)
MCHC: 31.7 g/dL (ref 30.0–36.0)
MCV: 93.9 fL (ref 78.0–100.0)
PLATELETS: 160 10*3/uL (ref 150–400)
RBC: 4.46 MIL/uL (ref 4.22–5.81)
RDW: 15.2 % (ref 11.5–15.5)
WBC: 13.4 10*3/uL — AB (ref 4.0–10.5)

## 2014-03-27 LAB — BASIC METABOLIC PANEL
Anion gap: 15 (ref 5–15)
BUN: 19 mg/dL (ref 6–23)
CO2: 24 mEq/L (ref 19–32)
Calcium: 9.1 mg/dL (ref 8.4–10.5)
Chloride: 98 mEq/L (ref 96–112)
Creatinine, Ser: 1.04 mg/dL (ref 0.50–1.35)
GFR calc Af Amer: 75 mL/min — ABNORMAL LOW (ref >90)
GFR, EST NON AFRICAN AMERICAN: 64 mL/min — AB (ref >90)
GLUCOSE: 150 mg/dL — AB (ref 70–99)
Potassium: 4.2 mEq/L (ref 3.7–5.3)
Sodium: 137 mEq/L (ref 137–147)

## 2014-03-27 LAB — I-STAT TROPONIN, ED: Troponin i, poc: 0.05 ng/mL (ref 0.00–0.08)

## 2014-03-27 LAB — PRO B NATRIURETIC PEPTIDE: Pro B Natriuretic peptide (BNP): 4726 pg/mL — ABNORMAL HIGH (ref 0–450)

## 2014-03-27 LAB — CBG MONITORING, ED: Glucose-Capillary: 142 mg/dL — ABNORMAL HIGH (ref 70–99)

## 2014-03-27 MED ORDER — SODIUM CHLORIDE 0.9 % IJ SOLN
3.0000 mL | Freq: Two times a day (BID) | INTRAMUSCULAR | Status: DC
  Administered 2014-03-27: 3 mL via INTRAVENOUS

## 2014-03-27 MED ORDER — LEVOFLOXACIN IN D5W 750 MG/150ML IV SOLN
750.0000 mg | Freq: Every day | INTRAVENOUS | Status: DC
  Administered 2014-03-27 – 2014-03-28 (×2): 750 mg via INTRAVENOUS
  Filled 2014-03-27 (×2): qty 150

## 2014-03-27 MED ORDER — ENOXAPARIN SODIUM 40 MG/0.4ML ~~LOC~~ SOLN
40.0000 mg | Freq: Every day | SUBCUTANEOUS | Status: DC
  Filled 2014-03-27: qty 0.4

## 2014-03-27 MED ORDER — SODIUM CHLORIDE 0.9 % IV SOLN: 250.0000 mL | INTRAVENOUS | Status: DC | PRN

## 2014-03-27 MED ORDER — BISOPROLOL FUMARATE 10 MG PO TABS
10.0000 mg | ORAL_TABLET | Freq: Every day | ORAL | Status: DC
  Administered 2014-03-28 – 2014-03-29 (×2): 10 mg via ORAL
  Filled 2014-03-27 (×2): qty 1

## 2014-03-27 MED ORDER — GUAIFENESIN ER 600 MG PO TB12
600.0000 mg | ORAL_TABLET | Freq: Every day | ORAL | Status: DC
  Administered 2014-03-27 – 2014-03-28 (×2): 600 mg via ORAL
  Filled 2014-03-27 (×3): qty 1

## 2014-03-27 MED ORDER — FUROSEMIDE 10 MG/ML IJ SOLN
20.0000 mg | Freq: Once | INTRAMUSCULAR | Status: AC
  Administered 2014-03-28: 20 mg via INTRAMUSCULAR
  Filled 2014-03-27: qty 2

## 2014-03-27 MED ORDER — ONDANSETRON HCL 4 MG/2ML IJ SOLN: 4.0000 mg | Freq: Four times a day (QID) | INTRAMUSCULAR | Status: DC | PRN

## 2014-03-27 MED ORDER — SODIUM CHLORIDE 0.9 % IJ SOLN: 3.0000 mL | INTRAMUSCULAR | Status: DC | PRN

## 2014-03-27 MED ORDER — GLIMEPIRIDE 2 MG PO TABS
2.0000 mg | ORAL_TABLET | Freq: Every day | ORAL | Status: DC
  Administered 2014-03-28 – 2014-03-29 (×2): 2 mg via ORAL
  Filled 2014-03-27 (×3): qty 1

## 2014-03-27 MED ORDER — POLYETHYLENE GLYCOL 3350 17 G PO PACK
17.0000 g | PACK | Freq: Every day | ORAL | Status: DC | PRN
  Filled 2014-03-27: qty 1

## 2014-03-27 MED ORDER — PREDNISONE 20 MG PO TABS
40.0000 mg | ORAL_TABLET | Freq: Every day | ORAL | Status: DC
  Administered 2014-03-28 – 2014-03-29 (×2): 40 mg via ORAL
  Filled 2014-03-27 (×3): qty 2

## 2014-03-27 MED ORDER — ONDANSETRON HCL 4 MG PO TABS: 4.0000 mg | ORAL_TABLET | Freq: Four times a day (QID) | ORAL | Status: DC | PRN

## 2014-03-27 MED ORDER — AMIODARONE HCL 200 MG PO TABS
200.0000 mg | ORAL_TABLET | Freq: Every day | ORAL | Status: DC
  Administered 2014-03-28 – 2014-03-29 (×2): 200 mg via ORAL
  Filled 2014-03-27 (×2): qty 1

## 2014-03-27 MED ORDER — SODIUM CHLORIDE 0.9 % IJ SOLN
3.0000 mL | Freq: Two times a day (BID) | INTRAMUSCULAR | Status: DC
  Administered 2014-03-28 – 2014-03-29 (×3): 3 mL via INTRAVENOUS

## 2014-03-27 MED ORDER — IPRATROPIUM BROMIDE 0.02 % IN SOLN
0.5000 mg | Freq: Once | RESPIRATORY_TRACT | Status: AC
  Administered 2014-03-27: 0.5 mg via RESPIRATORY_TRACT
  Filled 2014-03-27: qty 2.5

## 2014-03-27 MED ORDER — ACETAMINOPHEN 325 MG PO TABS: 650.0000 mg | ORAL_TABLET | Freq: Four times a day (QID) | ORAL | Status: DC | PRN

## 2014-03-27 MED ORDER — SENNA 8.6 MG PO TABS
1.0000 | ORAL_TABLET | Freq: Two times a day (BID) | ORAL | Status: DC
  Administered 2014-03-28: 8.6 mg via ORAL
  Filled 2014-03-27: qty 1

## 2014-03-27 MED ORDER — MORPHINE SULFATE 10 MG/5ML PO SOLN: 4.0000 mg | Freq: Two times a day (BID) | ORAL | Status: DC | PRN

## 2014-03-27 MED ORDER — ALUM & MAG HYDROXIDE-SIMETH 200-200-20 MG/5ML PO SUSP: 30.0000 mL | Freq: Four times a day (QID) | ORAL | Status: DC | PRN

## 2014-03-27 MED ORDER — FUROSEMIDE 10 MG/ML IJ SOLN
40.0000 mg | Freq: Once | INTRAMUSCULAR | Status: AC
  Administered 2014-03-27: 40 mg via INTRAVENOUS
  Filled 2014-03-27: qty 4

## 2014-03-27 MED ORDER — ISOSORBIDE MONONITRATE ER 60 MG PO TB24
60.0000 mg | ORAL_TABLET | Freq: Every day | ORAL | Status: DC
  Administered 2014-03-28 – 2014-03-29 (×2): 60 mg via ORAL
  Filled 2014-03-27 (×2): qty 1

## 2014-03-27 MED ORDER — DILTIAZEM HCL ER 240 MG PO CP24
240.0000 mg | ORAL_CAPSULE | Freq: Every day | ORAL | Status: DC
  Administered 2014-03-28 – 2014-03-29 (×2): 240 mg via ORAL
  Filled 2014-03-27 (×2): qty 1

## 2014-03-27 MED ORDER — ACETAMINOPHEN 650 MG RE SUPP: 650.0000 mg | Freq: Four times a day (QID) | RECTAL | Status: DC | PRN

## 2014-03-27 MED ORDER — HYDROCODONE-ACETAMINOPHEN 5-325 MG PO TABS
1.0000 | ORAL_TABLET | ORAL | Status: DC | PRN
  Administered 2014-03-29: 1 via ORAL
  Filled 2014-03-27: qty 1

## 2014-03-27 MED ORDER — FINASTERIDE 5 MG PO TABS
5.0000 mg | ORAL_TABLET | Freq: Every day | ORAL | Status: DC
  Administered 2014-03-28 – 2014-03-29 (×2): 5 mg via ORAL
  Filled 2014-03-27 (×2): qty 1

## 2014-03-27 MED ORDER — ATORVASTATIN CALCIUM 80 MG PO TABS
80.0000 mg | ORAL_TABLET | Freq: Every day | ORAL | Status: DC
  Administered 2014-03-28: 80 mg via ORAL
  Filled 2014-03-27 (×2): qty 1

## 2014-03-27 MED ORDER — FUROSEMIDE 20 MG PO TABS
20.0000 mg | ORAL_TABLET | Freq: Every day | ORAL | Status: DC
  Administered 2014-03-29: 20 mg via ORAL
  Filled 2014-03-27: qty 1

## 2014-03-27 MED ORDER — DIGOXIN 125 MCG PO TABS
0.1250 mg | ORAL_TABLET | Freq: Every day | ORAL | Status: DC
  Administered 2014-03-28 – 2014-03-29 (×2): 0.125 mg via ORAL
  Filled 2014-03-27 (×2): qty 1

## 2014-03-27 MED ORDER — ALBUTEROL SULFATE (2.5 MG/3ML) 0.083% IN NEBU
5.0000 mg | INHALATION_SOLUTION | Freq: Once | RESPIRATORY_TRACT | Status: AC
  Administered 2014-03-27: 5 mg via RESPIRATORY_TRACT
  Filled 2014-03-27: qty 6

## 2014-03-27 MED ORDER — APIXABAN 5 MG PO TABS
5.0000 mg | ORAL_TABLET | Freq: Two times a day (BID) | ORAL | Status: DC
  Administered 2014-03-27 – 2014-03-29 (×4): 5 mg via ORAL
  Filled 2014-03-27 (×5): qty 1

## 2014-03-27 MED ORDER — TAMSULOSIN HCL 0.4 MG PO CAPS
0.4000 mg | ORAL_CAPSULE | Freq: Two times a day (BID) | ORAL | Status: DC
  Administered 2014-03-27 – 2014-03-29 (×4): 0.4 mg via ORAL
  Filled 2014-03-27 (×5): qty 1

## 2014-03-27 MED ORDER — IPRATROPIUM-ALBUTEROL 0.5-2.5 (3) MG/3ML IN SOLN
3.0000 mL | RESPIRATORY_TRACT | Status: DC
  Administered 2014-03-27 – 2014-03-28 (×5): 3 mL via RESPIRATORY_TRACT
  Filled 2014-03-27 (×6): qty 3

## 2014-03-27 MED ORDER — VITAMIN D3 25 MCG (1000 UNIT) PO TABS
2000.0000 [IU] | ORAL_TABLET | Freq: Every day | ORAL | Status: DC
  Administered 2014-03-28 – 2014-03-29 (×2): 2000 [IU] via ORAL
  Filled 2014-03-27 (×2): qty 2

## 2014-03-27 NOTE — ED Notes (Signed)
Bed: WA18 Expected date:  Expected time:  Means of arrival:  Comments: EMS/shob 

## 2014-03-27 NOTE — ED Notes (Signed)
Initial contact-A&Ox4. Speaking full, clear sentences. Moving all extremities. Pt states "I am a DNR and do not want to be resuscitated." Has felt SOB for the last few weeks. Called MD and was called in Prednisone and Zpak. Finished with this regimen. No alleviation of symptoms. Denies chest pain, N, V, chills, abdominal pain. Still takes Prednisone every day. Hx COPD, CHF, DM. Worked Human resources officerroad construction for many years and has had breathing issues since then. Had PNA 2007. Visibly using accessory muscles to breathe. Awaiting MD.

## 2014-03-27 NOTE — H&P (Signed)
Physician Admission History and Physical     PCP:   [name], William, MD   Chief Complaint:  dypsneMartha [name]   HPI: Darin AntonJoe B [name] is an 80 y.o. male.  Pt w/ known systolic CHF, COPD, a fib, hx of MI followed by pulmonology and cardiology who presents w/ worsening dyspnea, increasing productivity of cough and no resolution of symptoms despite prednisone taper and z pack prescribed by his pulmonologist as an outpatient. He suffers from severe COPD and is on prednisone 5mg  dialy at baseline as well as nebulizers at home. His CHF seems relatively stable, although may have gained 3lbs over past few days per his home readings. [place] He voices compliance w/ all of his medications , including his eliquis. He has no complaints of CP or LE edema. The cough has become more productive and the character of the sputum has changed to a yellow color. Duration of symptoms is about 1 week. PTA he was given steroids and breathing treatments. His labored breathing has resolved prior to me seeing him this evening. He is resting comfortably at this time , although w/ extensive wheezing. His CXR showed pulm edema and his BNP is elevated, although half of what is was at last check,.   Review of Systems:  Neg except as noted above  Past Medical History: Past Medical History  Diagnosis Date  . Pneumonia 12/06/10    healthcare-associated/ left lower lobe  . COPD (chronic obstructive pulmonary disease)     severe stage IV  . Atrial fibrillation     on Coumadin with St. Jude single-chamber pacemaker  . Diabetes mellitus     type 2; s/p Prednisone therapy for pneumonia 12/2010  . Coronary artery disease     s/p anterior STEMI 09/2009; cath. revealed mid LAD 40%, distal LAD 100%- PTCA, prox. RCA 40%, mid. RCA 100% with good collateral filling of PDA; EF 25%; NSTEMI 07/2011 - PCI/DES 100% LCX  . ST elevation (STEMI) myocardial infarction 01//11/12    anterior  . CHF (congestive heart failure)     EF 25% on 09/26/10;  EF 50-55% and grade 1 diastolic dysfunction on ECHO 08/2010   . Hypertension   . Hyperlipidemia   . AAA (abdominal aortic aneurysm)     3 cm infrarenal abdominal aortic aneurysm per aorta ultrasound 03/2010  . Osteoarthritis     s/p bilat hip arthroplasty  . Angina   . Ischemic cardiomyopathy     EF 35% LHC 11/12  . GERD (gastroesophageal reflux disease)   . Hypercholesteremia   . PVD (peripheral vascular disease)   . Anxiety    Past Surgical History  Procedure Laterality Date  . Hip arthroplasty      s/p right 09/27/10 and s/p left 09/24/10  . Back surgery    . Knee surgery    . Pacemaker insertion  12/2010    St. Jude single-chamber   . Cardiac catheterization  09/2009, 06/2007    PTCA to distal LAD    Medications: Prior to Admission medications   Medication Sig Start Date End Date Taking? Authorizing Provider  albuterol (PROVENTIL) (2.5 MG/3ML) 0.083% nebulizer solution Take 2.5 mg by nebulization every 6 (six) hours as needed for wheezing or shortness of breath.   Yes Historical Provider, MD  amiodarone (PACERONE) 200 MG tablet Take 1 tablet (200 mg total) by mouth daily. 04/14/12  Yes Darin ClanWilliam Shaw, MD  beclomethasone (QVAR) 80 MCG/ACT inhaler Inhale 1 puff into the lungs 2 (two) times daily.  Yes Historical Provider, MD  bisoprolol (ZEBETA) 10 MG tablet Take 1 tablet (10 mg total) by mouth daily. 03/19/12 04/08/14 Yes Darin O Edmisten, PA-C  cholecalciferol (VITAMIN D) 1000 UNITS tablet Take 2,000 Units by mouth daily.   Yes Historical Provider, MD  dabigatran (PRADAXA) 150 MG CAPS capsule Take 150 mg by mouth 2 (two) times daily.   Yes Historical Provider, MD  digoxin (LANOXIN) 0.125 MG tablet Take 0.125 mg by mouth daily.   Yes Historical Provider, MD  diltiazem (DILACOR XR) 240 MG 24 hr capsule Take 240 mg by mouth daily.   Yes Historical Provider, MD  finasteride (PROSCAR) 5 MG tablet Take 5 mg by mouth daily.   Yes Historical Provider, MD  furosemide (LASIX) 20 MG  tablet Take 20 mg by mouth daily. TAKE one daily 06/15/12  Yes Darin Maw, MD  glimepiride (AMARYL) 2 MG tablet Take 1 tablet (2 mg total) by mouth daily with breakfast. 09/17/13  Yes Darin Clan, MD  guaiFENesin (MUCINEX) 600 MG 12 hr tablet Take 600 mg by mouth at bedtime.    Yes Historical Provider, MD  ipratropium (ATROVENT) 0.02 % nebulizer solution Take 2.5 mLs (0.5 mg total) by nebulization every 6 (six) hours. 09/17/13  Yes Darin Clan, MD  isosorbide mononitrate (IMDUR) 60 MG 24 hr tablet Take 60 mg by mouth daily.     Yes Historical Provider, MD  levalbuterol (XOPENEX) 1.25 MG/0.5ML nebulizer solution Take 1.25 mg by nebulization every 6 (six) hours. 09/17/13  Yes Darin Clan, MD  predniSONE (DELTASONE) 10 MG tablet Take 5 mg by mouth daily. 09/17/13  Yes Darin Clan, MD  rosuvastatin (CRESTOR) 40 MG tablet Take 40 mg by mouth daily.   Yes Historical Provider, MD  sennosides-docusate sodium (SENOKOT-S) 8.6-50 MG tablet Take 2 tablets by mouth at bedtime.    Yes Historical Provider, MD  Tamsulosin HCl (FLOMAX) 0.4 MG CAPS Take 0.4 mg by mouth 2 (two) times daily.    Yes Historical Provider, MD  morphine 10 MG/5ML solution Take 2 mLs (4 mg total) by mouth 2 (two) times daily as needed (refractory dyspnea). 01/07/14   Darin Cowden, MD  nitroGLYCERIN (NITROSTAT) 0.4 MG SL tablet Place 0.4 mg under the tongue every 5 (five) minutes as needed. For chest pain    Historical Provider, MD    Allergies:  No Known Allergies  Social History:  reports that he quit smoking about 4 years ago. His smoking use included Cigarettes. He has a 100 pack-year smoking history. He has never used smokeless tobacco. He reports that he drinks alcohol. He reports that he does not use illicit drugs.  Family History: Family History  Problem Relation Age of Onset  . Heart attack Mother 53  . Heart attack Father 72  . Cancer Other     siblings  . Heart attack Other     Physical Exam: Filed Vitals:   03/27/14  2100 03/27/14 2130 03/27/14 2135 03/27/14 2200  BP: 111/63 129/58  129/55  Pulse: 77 91  80  Temp:    98 F (36.7 C)  TempSrc:    Oral  Resp: 22 15  18   Height:    5\' 9"  (1.753 m)  Weight:    173 lb 15.1 oz (78.9 kg)  SpO2:   95% 95%   Gen: WM in NAD  HEENT: MMM, trachea midline, anicteric  Lungs: CTAB, wheezes present throughout w/ air movement in all lung fields  Cardio:  Ir/ir, rate controlled, no MRG ,  no pitting edema  Abd: soft, NT, ND  MSK:  No focal deficits  Skin: no jaundice , warm, dry    Labs on Admission:   Recent Labs  03/27/14 1809  NA 137  K 4.2  CL 98  CO2 24  GLUCOSE 150*  BUN 19  CREATININE 1.04  CALCIUM 9.1   No results found for this basename: AST, ALT, ALKPHOS, BILITOT, PROT, ALBUMIN,  in the last 72 hours No results found for this basename: LIPASE, AMYLASE,  in the last 72 hours  Recent Labs  03/27/14 1809  WBC 13.4*  HGB 13.3  HCT 41.9  MCV 93.9  PLT 160   No results found for this basename: CKTOTAL, CKMB, CKMBINDEX, TROPONINI,  in the last 72 hours Lab Results  Component Value Date   INR 1.41 12/31/2013   INR 2.03* 09/28/2012   INR 2.43* 09/27/2012   No results found for this basename: TSH, T4TOTAL, FREET3, T3FREE, THYROIDAB,  in the last 72 hours No results found for this basename: VITAMINB12, FOLATE, FERRITIN, TIBC, IRON, RETICCTPCT,  in the last 72 hours  Radiological Exams on Admission: Dg Chest 2 View (if Patient Has Fever And/or Copd)  03/27/2014   CLINICAL DATA:  Shortness of breath.  EXAM: CHEST  2 VIEW  COMPARISON:  01/17/2014  FINDINGS: There is cardiomegaly. Pulmonary vascularity is normal. Interstitial markings are accentuated with peribronchial thickening suggesting slight interstitial edema. There are no effusions. This is superimposed on COPD. Chronic blunting of the right costophrenic angle laterally. No acute osseous abnormality.  Single lead pacemaker in place.  IMPRESSION: Probable mild interstitial edema superimposed  on COPD.   Electronically Signed   By: Geanie Cooley M.D.   On: 03/27/2014 18:45   Orders placed during the hospital encounter of 03/27/14  . EKG 12-LEAD  . EKG 12-LEAD  . EKG 12-LEAD  . EKG 12-LEAD  . ED EKG  . ED EKG    Assessment/Plan  Acute on chronic systolic heart failure - most recent LVEF 40% 10 months prior. Daily weights. Strict I/O. S/p lasix 40 IV in ED, give lasix 20mg  IV in AM then reassess volume status .   COPD exacerbation - failed outpt steroids and z pack. pred 40 daily starting in AM (recv'd steroids via EMS). levaquin 750mg  daily. duoneb q6 hours    A fib - amio and eliquis . Tele . Has pacer . Rate controlled    Ethics - DO NOT RESUSCITATE    Lai Hendriks 03/27/2014, 10:49 PM

## 2014-03-27 NOTE — ED Notes (Signed)
Per EMS-from [facility]- C/o SOB today starting this morning and progressively worsening. Denies chest pain. 10 mg Albuterol, 0.5 Atrovent, 1.25 mg Solu-medrol. Hx COPD (doesn't wear O2 at home), Afib, CHF, AAA, DM. VS: BP 129/63 HR 63 RR 18 SpO2 100% on nebulizer treatment. Not c/o pain.

## 2014-03-27 NOTE — ED Provider Notes (Signed)
CSN: [id number]     Arrival date & time 03/27/14  1742 History   First MD Initiated Contact with Patient 03/27/14 1819     Chief Complaint  Patient presents with  . Shortness of Breath    Patient is a 80 y.o. male presenting with shortness of breath. The history is provided by the patient.  Shortness of Breath Severity:  Moderate Onset quality:  Gradual Duration:  1 week Timing:  Constant Progression:  Worsening Relieved by: steroids and breathing treatment from EMS. Ineffective treatments: His doctor put him on antibiotics and oral steroids but it did not help. Associated symptoms: cough   Associated symptoms: no abdominal pain and no chest pain   Risk factors comment:  Hx co COPD Pt was transported by EMS.  After their treatments, he has felt the best that he has this whole week but still doesn't feel back to normal.   Past Medical History  Diagnosis Date  . Pneumonia 12/06/10    healthcare-associated/ left lower lobe  . COPD (chronic obstructive pulmonary disease)     severe stage IV  . Atrial fibrillation     on Coumadin with St. Jude single-chamber pacemaker  . Diabetes mellitus     type 2; s/p Prednisone therapy for pneumonia 12/2010  . Coronary artery disease     s/p anterior STEMI 09/2009; cath. revealed mid LAD 40%, distal LAD 100%- PTCA, prox. RCA 40%, mid. RCA 100% with good collateral filling of PDA; EF 25%; NSTEMI 07/2011 - PCI/DES 100% LCX  . ST elevation (STEMI) myocardial infarction 01//11/12    anterior  . CHF (congestive heart failure)     EF 25% on 09/26/10; EF 50-55% and grade 1 diastolic dysfunction on ECHO 08/2010   . Hypertension   . Hyperlipidemia   . AAA (abdominal aortic aneurysm)     3 cm infrarenal abdominal aortic aneurysm per aorta ultrasound 03/2010  . Osteoarthritis     s/p bilat hip arthroplasty  . Angina   . Ischemic cardiomyopathy     EF 35% LHC 11/12  . GERD (gastroesophageal reflux disease)   . Hypercholesteremia   . PVD (peripheral  vascular disease)   . Anxiety    Past Surgical History  Procedure Laterality Date  . Hip arthroplasty      s/p right 09/27/10 and s/p left 09/24/10  . Back surgery    . Knee surgery    . Pacemaker insertion  12/2010    St. Jude single-chamber   . Cardiac catheterization  09/2009, 06/2007    PTCA to distal LAD   Family History  Problem Relation Age of Onset  . Heart attack Mother 78  . Heart attack Father 66  . Cancer Other     siblings  . Heart attack Other    History  Substance Use Topics  . Smoking status: Former Smoker -- 2.00 packs/day for 50 years    Types: Cigarettes    Quit date: 09/02/2009  . Smokeless tobacco: Never Used     Comment: smoked for 89yrs quit for 73yrs started back & smoked for 10ys  . Alcohol Use: Yes     Comment: occ - alcohol    Review of Systems  Respiratory: Positive for cough and shortness of breath.   Cardiovascular: Negative for chest pain.  Gastrointestinal: Negative for abdominal pain.  All other systems reviewed and are negative.     Allergies  Review of patient's allergies indicates no known allergies.  Home Medications   Prior to  Admission medications   Medication Sig Start Date End Date Taking? Authorizing Provider  acetylcysteine (MUCOMYST) 10 % nebulizer solution 6 ML until nebulized. Do not use more than 4 times each month 10/11/13   Kalman Shan, MD  albuterol (PROVENTIL) (2.5 MG/3ML) 0.083% nebulizer solution Take 2.5 mg by nebulization every 6 (six) hours as needed for wheezing or shortness of breath.    Historical Provider, MD  amiodarone (PACERONE) 200 MG tablet Take 1 tablet (200 mg total) by mouth daily. 04/14/12   Martha Clan, MD  apixaban (ELIQUIS) 5 MG TABS tablet Take 5 mg by mouth 2 (two) times daily.    Historical Provider, MD  azithromycin (ZITHROMAX Z-PAK) 250 MG tablet Take as directed. 03/22/14   Kalman Shan, MD  beclomethasone (QVAR) 80 MCG/ACT inhaler Inhale 1 puff into the lungs 2 (two) times daily.     Historical Provider, MD  bisoprolol (ZEBETA) 10 MG tablet Take 1 tablet (10 mg total) by mouth daily. 03/19/12 04/08/14  Brooke O Edmisten, PA-C  cefdinir (OMNICEF) 300 MG capsule Take 1 capsule (300 mg total) by mouth 2 (two) times daily. for 5 days 03/14/14   Kalman Shan, MD  cholecalciferol (VITAMIN D) 1000 UNITS tablet Take 2,000 Units by mouth daily.    Historical Provider, MD  digoxin (LANOXIN) 0.125 MG tablet Take 0.125 mg by mouth daily.    Historical Provider, MD  diltiazem (DILACOR XR) 240 MG 24 hr capsule Take 240 mg by mouth daily.    Historical Provider, MD  finasteride (PROSCAR) 5 MG tablet Take 5 mg by mouth daily.    Historical Provider, MD  furosemide (LASIX) 20 MG tablet Take 20 mg by mouth daily. TAKE one daily 06/15/12   Marinus Maw, MD  glimepiride (AMARYL) 2 MG tablet Take 1 tablet (2 mg total) by mouth daily with breakfast. 09/17/13   Martha Clan, MD  guaiFENesin (MUCINEX) 600 MG 12 hr tablet Take 600 mg by mouth at bedtime.     Historical Provider, MD  ipratropium (ATROVENT) 0.02 % nebulizer solution Take 2.5 mLs (0.5 mg total) by nebulization every 6 (six) hours. 09/17/13   Martha Clan, MD  isosorbide mononitrate (IMDUR) 60 MG 24 hr tablet Take 60 mg by mouth daily.      Historical Provider, MD  levalbuterol (XOPENEX) 1.25 MG/0.5ML nebulizer solution Take 1.25 mg by nebulization every 6 (six) hours. 09/17/13   Martha Clan, MD  morphine 10 MG/5ML solution Take 2 mLs (4 mg total) by mouth 2 (two) times daily as needed (refractory dyspnea). 01/07/14   Nyoka Cowden, MD  nitroGLYCERIN (NITROSTAT) 0.4 MG SL tablet Place 0.4 mg under the tongue every 5 (five) minutes as needed. For chest pain    Historical Provider, MD  predniSONE (DELTASONE) 10 MG tablet Take 5 mg by mouth daily. 09/17/13   Martha Clan, MD  predniSONE (DELTASONE) 10 MG tablet 4 tabs by mouth x2 days, then 2 tabs x2 days, 1 tab x2 days then resume 5mg  daily 03/14/14   Kalman Shan, MD  predniSONE (DELTASONE)  10 MG tablet Take 4 tablets x 1 day, then 3 tablets x 1 day, then 2 tablets x 1 day, then 1 tablet x 1 day, then half a tablet x 1 day then stop. 03/22/14   Kalman Shan, MD  rosuvastatin (CRESTOR) 40 MG tablet Take 40 mg by mouth daily.    Historical Provider, MD  sennosides-docusate sodium (SENOKOT-S) 8.6-50 MG tablet Take 2 tablets by mouth at bedtime.  Historical Provider, MD  Tamsulosin HCl (FLOMAX) 0.4 MG CAPS Take 0.4 mg by mouth daily.    Historical Provider, MD   BP 143/91  Pulse 94  Temp(Src) 99.2 F (37.3 C) (Oral)  Resp 20  SpO2 100% Physical Exam  Nursing note and vitals reviewed. Constitutional: He appears well-developed and well-nourished. No distress.  HENT:  Head: Normocephalic and atraumatic.  Right Ear: External ear normal.  Left Ear: External ear normal.  Eyes: Conjunctivae are normal. Right eye exhibits no discharge. Left eye exhibits no discharge. No scleral icterus.  Neck: Neck supple. No tracheal deviation present.  Cardiovascular: Normal rate, regular rhythm and intact distal pulses.   Pulmonary/Chest: Effort normal. No stridor. No respiratory distress. He has wheezes. He has no rales.  Few wheezes on expiration, able to speak in full sentences, pursed lip breathing on exhalation  Abdominal: Soft. Bowel sounds are normal. He exhibits no distension. There is no tenderness. There is no rebound and no guarding.  Musculoskeletal: He exhibits no edema and no tenderness.  Neurological: He is alert. He has normal strength. No cranial nerve deficit (no facial droop, extraocular movements intact, no slurred speech) or sensory deficit. He exhibits normal muscle tone. He displays no seizure activity. Coordination normal.  Skin: Skin is warm and dry. No rash noted.  Psychiatric: He has a normal mood and affect.    ED Course  Procedures (including critical care time) Labs Review Labs Reviewed  BASIC METABOLIC PANEL - Abnormal; Notable for the following:    Glucose,  Bld 150 (*)    GFR calc non Af Amer 64 (*)    GFR calc Af Amer 75 (*)    All other components within normal limits  CBC - Abnormal; Notable for the following:    WBC 13.4 (*)    All other components within normal limits  PRO B NATRIURETIC PEPTIDE - Abnormal; Notable for the following:    Pro B Natriuretic peptide (BNP) 4726.0 (*)    All other components within normal limits  CBG MONITORING, ED - Abnormal; Notable for the following:    Glucose-Capillary 142 (*)    All other components within normal limits  I-STAT TROPOININ, ED  I-STAT TROPOININ, ED    Imaging Review Dg Chest 2 View (if Patient Has Fever And/or Copd)  03/27/2014   CLINICAL DATA:  Shortness of breath.  EXAM: CHEST  2 VIEW  COMPARISON:  01/17/2014  FINDINGS: There is cardiomegaly. Pulmonary vascularity is normal. Interstitial markings are accentuated with peribronchial thickening suggesting slight interstitial edema. There are no effusions. This is superimposed on COPD. Chronic blunting of the right costophrenic angle laterally. No acute osseous abnormality.  Single lead pacemaker in place.  IMPRESSION: Probable mild interstitial edema superimposed on COPD.   Electronically Signed   By: Geanie Cooley M.D.   On: 03/27/2014 18:45     EKG Interpretation   Date/Time:  Sunday March 27 2014 17:52:15 EDT Ventricular Rate:  89 PR Interval:    QRS Duration: 135 QT Interval:  348 QTC Calculation: 423 R Axis:   -58 Text Interpretation:  Atrial fibrillation Ventricular premature complex  Left bundle branch block a fib present on EKG in april Confirmed by Daion Ginsberg   MD-J, Clayden Withem [phone]) on 03/27/2014 6:00:39 PM     Medications  furosemide (LASIX) injection 40 mg (not administered)  albuterol (PROVENTIL) (2.5 MG/3ML) 0.083% nebulizer solution 5 mg (5 mg Nebulization Given 03/27/14 1838)  ipratropium (ATROVENT) nebulizer solution 0.5 mg (0.5 mg Nebulization Given 03/27/14  [email or web address])    MDM   Final diagnoses:  None    Pt has been having  trouble with shortness of breath over the past week. He has tried steroid taper and abx but is not any better.  Findings now show COPD exacerbation but may have a component CHF as well.  BNP has been elavated in the past however.  Additional dose of lasix given in the ED.  Will consult with his PCP considering he failed recent outpatient treatment.    Linwood DibblesJon Delma Villalva, MD 03/27/14 [phone]

## 2014-03-27 NOTE — ED Notes (Signed)
Cala BradfordKimberly RN notified IV is leaking from skin tear around insertion site but PIV draws back blood and there is no redness or swelling.

## 2014-03-28 LAB — MRSA PCR SCREENING: MRSA BY PCR: POSITIVE — AB

## 2014-03-28 LAB — CBC
HCT: 38.7 % — ABNORMAL LOW (ref 39.0–52.0)
Hemoglobin: 12.4 g/dL — ABNORMAL LOW (ref 13.0–17.0)
MCH: 29.8 pg (ref 26.0–34.0)
MCHC: 32 g/dL (ref 30.0–36.0)
MCV: 93 fL (ref 78.0–100.0)
Platelets: 149 10*3/uL — ABNORMAL LOW (ref 150–400)
RBC: 4.16 MIL/uL — ABNORMAL LOW (ref 4.22–5.81)
RDW: 15 % (ref 11.5–15.5)
WBC: 10.8 10*3/uL — ABNORMAL HIGH (ref 4.0–10.5)

## 2014-03-28 LAB — BASIC METABOLIC PANEL
Anion gap: 16 — ABNORMAL HIGH (ref 5–15)
BUN: 22 mg/dL (ref 6–23)
CALCIUM: 8.9 mg/dL (ref 8.4–10.5)
CO2: 25 mEq/L (ref 19–32)
Chloride: 97 mEq/L (ref 96–112)
Creatinine, Ser: 0.89 mg/dL (ref 0.50–1.35)
GFR calc non Af Amer: 77 mL/min — ABNORMAL LOW (ref >90)
GFR, EST AFRICAN AMERICAN: 89 mL/min — AB (ref >90)
Glucose, Bld: 294 mg/dL — ABNORMAL HIGH (ref 70–99)
Potassium: 4 mEq/L (ref 3.7–5.3)
Sodium: 138 mEq/L (ref 137–147)

## 2014-03-28 MED ORDER — CHLORHEXIDINE GLUCONATE CLOTH 2 % EX PADS
6.0000 | MEDICATED_PAD | Freq: Every day | CUTANEOUS | Status: DC
  Administered 2014-03-29: 6 via TOPICAL

## 2014-03-28 MED ORDER — MUPIROCIN 2 % EX OINT
1.0000 "application " | TOPICAL_OINTMENT | Freq: Two times a day (BID) | CUTANEOUS | Status: DC
  Administered 2014-03-28 – 2014-03-29 (×2): 1 via NASAL
  Filled 2014-03-28: qty 22

## 2014-03-28 MED ORDER — IPRATROPIUM-ALBUTEROL 0.5-2.5 (3) MG/3ML IN SOLN
3.0000 mL | RESPIRATORY_TRACT | Status: DC | PRN
  Administered 2014-03-29: 3 mL via RESPIRATORY_TRACT
  Filled 2014-03-28: qty 3

## 2014-03-28 NOTE — Progress Notes (Signed)
Subjective: Reviewed history- failed outpatient therapy with brief (6 day) steroid taper and Zpack.  Increased shortness of breath, sputum production, wheezing.  Improved significantly since admission.  Sputum has changed from orange/gray to clear and decreased wheezing.  Objective: Vital signs in last 24 hours: Temp:  [97.6 F (36.4 C)-99 F (37.2 C)] 97.6 F (36.4 C) (07/13 1343) Pulse Rate:  [54-116] 87 (07/13 1343) Resp:  [15-26] 18 (07/13 1343) BP: (90-141)/(50-82) 127/54 mmHg (07/13 1343) SpO2:  [90 %-97 %] 97 % (07/13 1343) Weight:  [78.064 kg (172 lb 1.6 oz)-78.9 kg (173 lb 15.1 oz)] 78.064 kg (172 lb 1.6 oz) (07/13 0601) Weight change:  Last BM Date: 03/27/14  CBG (last 3)   Recent Labs  03/27/14 1810  GLUCAP 142*    Intake/Output from previous day: 07/12 0701 - 07/13 0700 In: 153 [I.V.:3; IV Piggyback:150] Out: 575 [Urine:575] Intake/Output this shift: Total I/O In: 603 [P.O.:600; I.V.:3] Out: 1075 [Urine:1075]  General appearance: alert and chronically ill Eyes: no scleral icterus Throat: oropharynx moist without erythema Resp: decreased air movement bilaterally with minimal wheezing Cardio: regular rate and rhythm GI: soft, non-tender; bowel sounds normal; no masses,  no organomegaly Extremities: no clubbing, cyanosis or edema   Lab Results:  Recent Labs  03/27/14 1809 03/28/14 0455  NA 137 138  K 4.2 4.0  CL 98 97  CO2 24 25  GLUCOSE 150* 294*  BUN 19 22  CREATININE 1.04 0.89  CALCIUM 9.1 8.9   No results found for this basename: AST, ALT, ALKPHOS, BILITOT, PROT, ALBUMIN,  in the last 72 hours  Recent Labs  03/27/14 1809 03/28/14 0455  WBC 13.4* 10.8*  HGB 13.3 12.4*  HCT 41.9 38.7*  MCV 93.9 93.0  PLT 160 149*   Lab Results  Component Value Date   INR 1.41 12/31/2013   INR 2.03* 09/28/2012   INR 2.43* 09/27/2012   No results found for this basename: CKTOTAL, CKMB, CKMBINDEX, TROPONINI,  in the last 72 hours No results found for  this basename: TSH, T4TOTAL, FREET3, T3FREE, THYROIDAB,  in the last 72 hours No results found for this basename: VITAMINB12, FOLATE, FERRITIN, TIBC, IRON, RETICCTPCT,  in the last 72 hours  Studies/Results: Dg Chest 2 View (if Patient Has Fever And/or Copd)  03/27/2014   CLINICAL DATA:  Shortness of breath.  EXAM: CHEST  2 VIEW  COMPARISON:  01/17/2014  FINDINGS: There is cardiomegaly. Pulmonary vascularity is normal. Interstitial markings are accentuated with peribronchial thickening suggesting slight interstitial edema. There are no effusions. This is superimposed on COPD. Chronic blunting of the right costophrenic angle laterally. No acute osseous abnormality.  Single lead pacemaker in place.  IMPRESSION: Probable mild interstitial edema superimposed on COPD.   Electronically Signed   By: Geanie CooleyJim  Maxwell M.D.   On: 03/27/2014 18:45     Medications: Scheduled: . amiodarone  200 mg Oral Daily  . apixaban  5 mg Oral BID  . atorvastatin  80 mg Oral q1800  . bisoprolol  10 mg Oral Daily  . [START ON 03/29/2014] Chlorhexidine Gluconate Cloth  6 each Topical Q0600  . cholecalciferol  2,000 Units Oral Daily  . digoxin  0.125 mg Oral Daily  . diltiazem  240 mg Oral Daily  . finasteride  5 mg Oral Daily  . [START ON 03/29/2014] furosemide  20 mg Oral Daily  . glimepiride  2 mg Oral Q breakfast  . guaiFENesin  600 mg Oral QHS  . ipratropium-albuterol  3 mL Nebulization Q4H  .  isosorbide mononitrate  60 mg Oral Daily  . levofloxacin (LEVAQUIN) IV  750 mg Intravenous QHS  . mupirocin ointment  1 application Nasal BID  . predniSONE  40 mg Oral Q breakfast  . senna  1 tablet Oral BID  . sodium chloride  3 mL Intravenous Q12H  . sodium chloride  3 mL Intravenous Q12H  . tamsulosin  0.4 mg Oral BID   Continuous:   Assessment/Plan: Principal Problem: 1. Acute-on-chronic respiratory failure- acute episode seems most related to COPD exacerbation with increased sputum, wheezing and dyspnea with minimal  exertion.  Continue prolonged steroid taper back to home dose and Levaquin.  Continue bronchodilators.   Active Problems: 2. CHF (congestive heart failure)- appears well compensated at this point after IV Lasix.  Suspect CHF is at baseline.  Continue medical therapy- resume Lasix 20mg  po daily in am. 3. COPD exacerbation- as above 4. Type 2 diabetes mellitus- steroid induced hyperglycemia.  Continue home medications.   Disposition- anticipate discharge tomorrow with follow-up with Dr. Marchelle Gearing as scheduled on Thursday.  Arrange home O2.   LOS: 1 day   Martha Clan 03/28/2014, 6:55 PM

## 2014-03-28 NOTE — Progress Notes (Signed)
Inpatient Diabetes Program Recommendations  AACE/ADA: New Consensus Statement on Inpatient Glycemic Control (2013)  Target Ranges:  Prepandial:   less than 140 mg/dL      Peak postprandial:   less than 180 mg/dL (1-2 hours)      Critically ill patients:  140 - 180 mg/dL   Results for [name], [name] (MRN [id number]) as of 03/28/2014 12:16  Ref. Range 03/27/2014 18:09 03/28/2014 04:55  Glucose Latest Range: 70-99 mg/dL 413150 (H) 244294 (H)    Inpatient Diabetes Program Recommendations Correction (SSI): Add Novolog sensitive tidwc HgbA1C: Check HgbA1C to assess glycemic control prior to hospitalization  Note: Will follow while inpatient. On Prednisone 40 mg QAM.. CBGs will likely go up. Monitor while inpatient.  Thank you. Ailene Ardshonda Cynthis Purington, RD, LDN, CDE Inpatient Diabetes Coordinator [phone]

## 2014-03-28 NOTE — Progress Notes (Signed)
MEDICATION RELATED CONSULT NOTE - INITIAL   Pharmacy Consult for Apixaban Indication: atrial fibrillation  No Known Allergies  Patient Measurements: Height: 5\' 9"  (175.3 cm) Weight: 172 lb 1.6 oz (78.064 kg) IBW/kg (Calculated) : 70.7  Labs:  Recent Labs  03/27/14 1809 03/28/14 0455  WBC 13.4* 10.8*  HGB 13.3 12.4*  HCT 41.9 38.7*  PLT 160 149*  CREATININE 1.04 0.89   Estimated Creatinine Clearance: 62.9 ml/min (by C-G formula based on Cr of 0.89).  Medical History: Past Medical History  Diagnosis Date  . Pneumonia 12/06/10    healthcare-associated/ left lower lobe  . COPD (chronic obstructive pulmonary disease)     severe stage IV  . Atrial fibrillation     on Coumadin with St. Jude single-chamber pacemaker  . Diabetes mellitus     type 2; s/p Prednisone therapy for pneumonia 12/2010  . Coronary artery disease     s/p anterior STEMI 09/2009; cath. revealed mid LAD 40%, distal LAD 100%- PTCA, prox. RCA 40%, mid. RCA 100% with good collateral filling of PDA; EF 25%; NSTEMI 07/2011 - PCI/DES 100% LCX  . ST elevation (STEMI) myocardial infarction 01//11/12    anterior  . CHF (congestive heart failure)     EF 25% on 09/26/10; EF 50-55% and grade 1 diastolic dysfunction on ECHO 08/2010   . Hypertension   . Hyperlipidemia   . AAA (abdominal aortic aneurysm)     3 cm infrarenal abdominal aortic aneurysm per aorta ultrasound 03/2010  . Osteoarthritis     s/p bilat hip arthroplasty  . Angina   . Ischemic cardiomyopathy     EF 35% LHC 11/12  . GERD (gastroesophageal reflux disease)   . Hypercholesteremia   . PVD (peripheral vascular disease)   . Anxiety     Assessment: 85 yoM on chronic anticoagulation for atrial fibrillation.  Patient reports taking Pradaxa.  Per 02/18/14 cardiology office note[facility]aban for his atrial fibrillation.   Confirmed with patient's pharmacy that patient was switched to Apixaban 5 mg  BID.  Pharmacy consulted to resume Apixaban inpatient.  Patient does not meet criteria for dosage reduction (Serum creatinine ?1.5 mg/dL and either age ?80 years or body weight ?60 kg).  Goal of Therapy:  Anticoagulate for prevention of stroke  Plan:  Resume apixaban 5 mg PO BID.   Clance Bollunyon, Caili Escalera 03/28/2014,10:59 AM

## 2014-03-29 ENCOUNTER — Other Ambulatory Visit: Payer: Self-pay

## 2014-03-29 MED ORDER — POLYMYXIN B-TRIMETHOPRIM 10000-0.1 UNIT/ML-% OP SOLN: 2.0000 [drp] | OPHTHALMIC | Status: DC

## 2014-03-29 MED ORDER — PREDNISONE 10 MG PO TABS: 10.0000 mg | ORAL_TABLET | Freq: Every day | ORAL | Status: DC

## 2014-03-29 MED ORDER — LEVOFLOXACIN 750 MG PO TABS: 750.0000 mg | ORAL_TABLET | Freq: Every day | ORAL | Status: AC

## 2014-03-29 NOTE — Discharge Summary (Signed)
DISCHARGE SUMMARY  [name]  MR#: [id number]  DOB:April 20, 1928  Date of Admission: 03/27/2014 Date of Discharge: 03/29/2014  Attending Physician:Shaw, Chrissie Noa  Patient's [name], Chrissie Noa, MD  Consults: None  Discharge Diagnoses: Principal Problem:   Acute-on-chronic respiratory failure Active Problems:   CAD (coronary artery disease)   COPD exacerbation   Acute on chronic combined systolic and diastolic heart failure   Type 2 diabetes mellitus   Permanent atrial fibrillation   Chronic anticoagulation  Past Medical History  Diagnosis Date  . Pneumonia 12/06/10    healthcare-associated/ left lower lobe  . COPD (chronic obstructive pulmonary disease)     severe stage IV  . Atrial fibrillation     on Coumadin with St. Jude single-chamber pacemaker  . Diabetes mellitus     type 2; s/p Prednisone therapy for pneumonia 12/2010  . Coronary artery disease     s/p anterior STEMI 09/2009; cath. revealed mid LAD 40%, distal LAD 100%- PTCA, prox. RCA 40%, mid. RCA 100% with good collateral filling of PDA; EF 25%; NSTEMI 07/2011 - PCI/DES 100% LCX  . ST elevation (STEMI) myocardial infarction 01//11/12    anterior  . CHF (congestive heart failure)     EF 25% on 09/26/10; EF 50-55% and grade 1 diastolic dysfunction on ECHO 08/2010   . Hypertension   . Hyperlipidemia   . AAA (abdominal aortic aneurysm)     3 cm infrarenal abdominal aortic aneurysm per aorta ultrasound 03/2010  . Osteoarthritis     s/p bilat hip arthroplasty  . Angina   . Ischemic cardiomyopathy     EF 35% LHC 11/12  . GERD (gastroesophageal reflux disease)   . Hypercholesteremia   . PVD (peripheral vascular disease)   . Anxiety    Past Surgical History  Procedure Laterality Date  . Hip arthroplasty      s/p right 09/27/10 and s/p left 09/24/10  . Back surgery    . Knee surgery    . Pacemaker insertion  12/2010    St. Jude single-chamber   . Cardiac catheterization  09/2009, 06/2007    PTCA to distal  LAD    Discharge Medications:   Medication List    STOP taking these medications       levalbuterol 1.25 MG/0.5ML nebulizer solution  Commonly known as:  XOPENEX      TAKE these medications       albuterol (2.5 MG/3ML) 0.083% nebulizer solution  Commonly known as:  PROVENTIL  Take 2.5 mg by nebulization every 6 (six) hours as needed for wheezing or shortness of breath.     amiodarone 200 MG tablet  Commonly known as:  PACERONE  Take 1 tablet (200 mg total) by mouth daily.     beclomethasone 80 MCG/ACT inhaler  Commonly known as:  QVAR  Inhale 1 puff into the lungs 2 (two) times daily.     bisoprolol 10 MG tablet  Commonly known as:  ZEBETA  Take 1 tablet (10 mg total) by mouth daily.     cholecalciferol 1000 UNITS tablet  Commonly known as:  VITAMIN D  Take 2,000 Units by mouth daily.     digoxin 0.125 MG tablet  Commonly known as:  LANOXIN  Take 0.125 mg by mouth daily.     diltiazem 240 MG 24 hr capsule  Commonly known as:  DILACOR XR  Take 240 mg by mouth daily.     ELIQUIS 5 MG Tabs tablet  Generic drug:  apixaban  Take 5 mg by  mouth 2 (two) times daily.     finasteride 5 MG tablet  Commonly known as:  PROSCAR  Take 5 mg by mouth daily.     furosemide 20 MG tablet  Commonly known as:  LASIX  Take 20 mg by mouth daily.     glimepiride 2 MG tablet  Commonly known as:  AMARYL  Take 1 tablet (2 mg total) by mouth daily with breakfast.     guaiFENesin 600 MG 12 hr tablet  Commonly known as:  MUCINEX  Take 600 mg by mouth at bedtime.     ipratropium 0.02 % nebulizer solution  Commonly known as:  ATROVENT  Take 2.5 mLs (0.5 mg total) by nebulization every 6 (six) hours.     isosorbide mononitrate 60 MG 24 hr tablet  Commonly known as:  IMDUR  Take 60 mg by mouth daily.     levofloxacin 750 MG tablet  Commonly known as:  LEVAQUIN  Take 1 tablet (750 mg total) by mouth daily.     morphine 10 MG/5ML solution  Take 2 mLs (4 mg total) by mouth 2  (two) times daily as needed (refractory dyspnea).     nitroGLYCERIN 0.4 MG SL tablet  Commonly known as:  NITROSTAT  Place 0.4 mg under the tongue every 5 (five) minutes as needed. For chest pain     predniSONE 10 MG tablet  Commonly known as:  DELTASONE  Take 1 tablet (10 mg total) by mouth daily. 4 pills daily X 3 days, then 2 pills daily X  3 days, then 1 pill daily X 3 days, then resume 5mg  daily     trimethoprim-polymyxin b ophthalmic solution  Commonly known as:  POLYTRIM  Place 2 drops into the right eye every 4 (four) hours.      ASK your doctor about these medications       rosuvastatin 40 MG tablet  Commonly known as:  CRESTOR  Take 40 mg by mouth daily.     sennosides-docusate sodium 8.6-50 MG tablet  Commonly known as:  SENOKOT-S  Take 2 tablets by mouth at bedtime.     tamsulosin 0.4 MG Caps capsule  Commonly known as:  FLOMAX  Take 0.4 mg by mouth 2 (two) times daily.        Hospital Procedures: Dg Chest 2 View (if Patient Has Fever And/or Copd)  03/27/2014   CLINICAL DATA:  Shortness of breath.  EXAM: CHEST  2 VIEW  COMPARISON:  01/17/2014  FINDINGS: There is cardiomegaly. Pulmonary vascularity is normal. Interstitial markings are accentuated with peribronchial thickening suggesting slight interstitial edema. There are no effusions. This is superimposed on COPD. Chronic blunting of the right costophrenic angle laterally. No acute osseous abnormality.  Single lead pacemaker in place.  IMPRESSION: Probable mild interstitial edema superimposed on COPD.   Electronically Signed   By: Geanie Cooley M.D.   On: 03/27/2014 18:45    History of Present Illness: [name] is an 80 y.o. male. Pt w/ known systolic CHF, COPD, a fib, hx of MI followed by pulmonology and cardiology who presents w/ worsening dyspnea, increasing productivity of cough and no resolution of symptoms despite prednisone taper and z pack prescribed by his pulmonologist as an outpatient. He suffers  from severe COPD and is on prednisone 5mg  dialy at baseline as well as nebulizers at home. His CHF seems relatively stable, although may have gained 3lbs over past few days per his home readings. He lives in [place]. He voices  compliance w/ all of his medications , including his eliquis. He has no complaints of CP or LE edema. The cough has become more productive and the character of the sputum has changed to a yellow color. Duration of symptoms is about 1 week. PTA he was given steroids and breathing treatments. His labored breathing has resolved prior to me seeing him this evening. He is resting comfortably at this time , although w/ extensive wheezing. His CXR showed pulm edema and his BNP is elevated, although half of what is was at last check.   Patient had been treated with Zpack and brief 6 day steroid taper as an outpatient per Dr. Marchelle Gearing with no improvement prior to admission.  He was admitted for further management since he failed outpatient treatment.   Hospital Course: [name] was admitted to a telemetry bed.  He was initially treated with IV steroids and transitioned to oral prednisone after admission. Placed on scheduled bronchodilators and Levaquin IV. He received an additional dose of IV Lasix. With this treatment, his symptoms rapidly improved with resolution of wheezing and dyspnea. It is felt that the majority of his acute symptoms are related to his COPD with acute exacerbation. He did have increased sputum production, wheezing, and shortness of breath consistent with prior COPD flares. It is apparent that he requires higher doses and prolonged steroid courses to stabilize his acute flares. Perhaps, in the future we can avoid hospitalization by giving him a dose of IM Solu-Medrol and higher doses of oral prednisone for a longer duration to treat his flares. He'll be discharged with a slow prednisone taper (40 mg x3 days, then 20 mg daily x3 days, then 10 mg daily x3 days, then 5 mg  daily thereafter) in addition to 6 more days of Levaquin to complete antibiotic therapy.   He is scheduled to see Dr. Marchelle Gearing (pulmonology) in 2 days to discuss further.  His CHF appears stable with no evidence of volume overload.  Will try to arrange home oxygen due to his severe COPD and CHF.  Day of Discharge Exam BP 131/86  Pulse 84  Temp(Src) 97.7 F (36.5 C) (Oral)  Resp 18  Ht 5\' 9"  (1.753 m)  Wt 78.109 kg (172 lb 3.2 oz)  BMI 25.42 kg/m2  SpO2 94%  Physical Exam: General appearance: alert and no distress Eyes: no scleral icterus Throat: oropharynx moist without erythema Resp: minimal tachypnea with movement; decreased air movement but no wheezing bilaterally Cardio: irregularly irregular rhythm GI: soft, non-tender; bowel sounds normal; no masses,  no organomegaly Extremities: no clubbing, cyanosis or edema  Discharge Labs:  Recent Labs  03/27/14 1809 03/28/14 0455  NA 137 138  K 4.2 4.0  CL 98 97  CO2 24 25  GLUCOSE 150* 294*  BUN 19 22  CREATININE 1.04 0.89  CALCIUM 9.1 8.9     Recent Labs  03/27/14 1809 03/28/14 0455  WBC 13.4* 10.8*  HGB 13.3 12.4*  HCT 41.9 38.7*  MCV 93.9 93.0  PLT 160 149*   Lab Results  Component Value Date   INR 1.41 12/31/2013   INR 2.03* 09/28/2012   INR 2.43* 09/27/2012    Discharge instructions:     Discharge Instructions   Diet - low sodium heart healthy    Complete by:  As directed      Discharge instructions    Complete by:  As directed   Call and increase Prednisone dose if increased shortness of breath, wheezing, cough, sputum production.  Increase activity slowly    Complete by:  As directed            Disposition: to home  Follow-up Appts: [facility]  Our office will call him for a TOC visit.  Condition on Discharge: improved, approaching baseline  Tests Needing Follow-up: None  Signed: Martha ClanShaw, William 03/29/2014, 6:58 AM

## 2014-03-29 NOTE — Progress Notes (Signed)
Pt vitals took off continuous pulse ox.

## 2014-03-31 ENCOUNTER — Ambulatory Visit (INDEPENDENT_AMBULATORY_CARE_PROVIDER_SITE_OTHER): Payer: Medicare Other | Admitting: Adult Health

## 2014-03-31 ENCOUNTER — Encounter: Payer: Self-pay | Admitting: Adult Health

## 2014-03-31 VITALS — BP 130/60 | HR 91 | Temp 97.5°F | Wt 178.0 lb

## 2014-03-31 DIAGNOSIS — I2510 Atherosclerotic heart disease of native coronary artery without angina pectoris: Secondary | ICD-10-CM

## 2014-03-31 DIAGNOSIS — J441 Chronic obstructive pulmonary disease with (acute) exacerbation: Secondary | ICD-10-CM

## 2014-03-31 DIAGNOSIS — I5043 Acute on chronic combined systolic (congestive) and diastolic (congestive) heart failure: Secondary | ICD-10-CM

## 2014-03-31 NOTE — Patient Instructions (Signed)
Finish Levaquin as directed.  Taper prednisone as directed to 5mg  daily  Follow up Dr. Marchelle Gearingamaswamy in 4 weeks and As needed   Please contact office for sooner follow up if symptoms do not improve or worsen or seek emergency care

## 2014-04-03 NOTE — Progress Notes (Signed)
Subjective:    Patient ID: [name], male    DOB: [birth year], 80 y.o.   MRN: [id number]  HPI   OV 02/15/2014  Chief Complaint  Patient presents with  . Follow-up    Pt c/o increase in SOB with any daily activities and increase in fatigue x 1 week. C/o mild cough with clear mucous. Denies CP/tightness.     #. 75 pack smoking hx. Quit 2010. Known CAD  #. COPD - Gold stage 3-4  On atrovent, pulmicort nebs, Xopenex and daily prednisone 5 mg - PFTs 11/21/2010  - spirometry only: Fev1 0.84L/31%, Ratio 45. Gold stage 3-4 COPD Spiro 04/26/2011 - fev1 1.06L/34% CAT score 17 - 02/04/2012 Did not desaturate May 2012 185 feet x 3 laps.   3. AECOPD hx:   -March 2012 (LLL PNA),   - April 2012 - clear cxr - Acute Visit 03/05/12 - OPD Rx - June 2013 - a fib and aECopd Admit - July 2013  - for HCAP - January 2013-admitted by Dr. Lutricia Feil for COPD exacerbation. Treated in the telemetry unit. No BiPAP - March 2014: Augmentin and prednisone outpatient treatment. Start N. Acetylcysteine  #IMaging hx  - 2005 CT scan chest no evidence of lung cancer - 2014 January chest x-ray no evidence of lung cancer - 2014 March: discussed - he is not interested in screening, "I do not want to know about it"    OV 12/09/2012  COPD followup. He continues to live in [place]. He does daily exercises. In January 2014 he was admitted for COPD exacerbation. He was doing well but a few days ago developed sinusitis. His  primary care physician Dr. Lutricia Feil  started him on Augmentin on 12/04/2012. He still has a few more days of Augmentin left. He is feeling better but he feels that rate of resolution is low. He feels there is a lot of chest congestion. He had tried increasing his daily prednisone from 5 mg to 10 mg but this has not helped. COPD cat score is 25 and significantly worse than baseline; all documented below  #COPD  You have mild attack of copd called COPD exacerbation  Avoid exercises  in the middle of a COPD exacerbation  Please take prednisone 12m once daily x 3 days, then 372mdaily x 3 days, 2037mnce daily x 3 days, then 40m36mce daily x 3 days, then 5mg 21me daily to continue  Start N. acetylcysteine 600 mg twice a day  - [name] published in Lance[employer]ropic014 shows it helps   - this is not to make you feel better but to to prevent recurrent bronchitis episodes  - You are more likely to get this drug at [facility] vitamin store then at [employer]  - You can also get this at website www.vWholesaleCream.sit functions as an anti-oxidant and there are minimal/none side effects  #Lung cancer screening - We discussed this but I respect your desire to postpone it  #Followup 4-5 months with COPD cat score at followup  OV 04/08/2013  FU COPD -- severe/very severe  Since lst OV has improved. [facility] Occ uses cane. COPD is stable. CAT score is 17 (see below) and reflects baseline activity. He is looking better an dfeeling better. Continues on duopneb q6h + qvar bid + xopnex prn + pred 5mg p75mday. Has not taken NAC. REltuctant for mdi changes due  to vA system logistics.  Only issue is recent cold and saw PMD who bumped his pred up and now back to [place]. He does not want to titrate prednisone down further; wants to remain at 23m per day   OV 10/11/2013  Chief Complaint  Patient presents with  . Follow-up    Pt just got out of the hospital friday. He had PNA. Pt c/o SOB, prod cough w/ white phlem, wheezing, chest tx.    FU COPD - Hospitalized around Christmas 2014 2 09/17/2013 under his primary care service. Apparently diagnosis was pneumonia not otherwise specified, review of imaging shows some right lower lobe infiltrate. I do not see any positive blood culture sputum culture or flu panel. [facility] Then on [facility]2013 [facility] therapy there is pending. He is improved from his recent hospitalization but he does feel deconditioned compared to baseline and more dyspneic compared to baseline. COPD cat score has declined back to 24. Is compliant with his nebulizers. He says that while in the hospital he was given some nebulizers Mucomyst that really helped bring out his sputum and he wants to give this a try. I've warned him about the paradoxical bronchospasm so he will try to judicious amounts  REC Glad you are better but understand you still have some way to go [facility], O2 and other nebs To help mucus clearance  - You can try mucomyst nebulizer Acetylcysteine 10% solution, 6 mL until nebulized. Do not try more than 4 times each month because it can make wheezing worse while trying to get rid of your sputum  FOllowup  3 months or sooner CAT score at followup  OV 11/08/2013  Chief Complaint  Patient presents with  . Acute Visit    Pt states Thursday he has a stomach virus and this made him weak. He  states since then he has been having increased SOB, chest congestion, productive cough wiht yellow phlegm.    Acute visit. Was feeling baseline. But on 11/04/2013 had some nausea with vomiting and later in the day diarrhea. But both resolved by end of the day 11/04/2013. Then on 11/05/2013 started noticing dyspnea on exertion that was worse than baseline. This is moderate in severity. Relieved by rest. Has been stable to slightly worse over the next few days. However today started having increasing cough compared to baseline and slight change in color of sputum to thick yellow. And also increased sputum volume.  He is frustrated that he does not have a new neb machine despite [place]. Also his Mucomyst nebulizers have not come through despite [place] to pharmacy and arranging 20% solution.   [facility] 2 [facility]  [facility]  [facility]  [facility] follow up .  Admitted 4/17-4/23 for COPD exacerbation, acute on chronic combined systolic and diastolic heart failure , and PNA .  Patient was treated with IV antibiotics, steroids, and nebulized bronchodilators. He did require BiPAP support briefly. Improved with diuresis. Chest x-ray prior to discharge showed improved right sided lung infiltrates.  Since discharge. Patient is improved with decreased cough, congestion. Patient does feel weak, and has not regained his energy level. Yet. He denies any hemoptysis, chest pain, orthopnea, PND, or leg swelling Patient has tapered his prednisone to 5 mg daily.   OV 02/15/2014  Chief Complaint  Patient presents with  . Follow-up  Pt c/o increase in SOB with any daily activities and increase in fatigue x 1 week. C/o mild cough with clear mucous. Denies CP/tightness.    Followup severe COPD and multiple exacerbation  - Overall he is stable. He continues prednisone 5 mg per day. He is also on amiodarone and digoxin and bisoprolol for his cardiac issues along with Cardizem for atrial fibrillation.  COPD medications include DuoNeb. He is not on oxygen. He is also on oral morphine as needed for dyspnea. Morphine was started at the time of prior hospitalization and at discharge. Overall he is stable. He is quite functional.  Today he noticed increased dyspnea somewhat worse compared to baseline but without any associated worsening of cough, changes due to production or chest pain no orthopnea or paroxysmal nocturnal dyspnea or edema. COPD cat score is 24 and similar to baseline I suspect. Of note, sometime back I prescribed N-acetylcysteine mist at his request  but he has never tried this. He wants to try this today was dyspnea   CAT COPD Symptom & Quality of Life Score ([employer]) 0 is no burden. 5 is highest burden 02/15/2014   Never Cough -> Cough all the time 1  No phlegm in chest -> Chest is full of phlegm 2  No chest tightness -> Chest  feels very tight 0  No dyspnea for 1 flight stairs/hill -> Very dyspneic for 1 flight of stairs 5  No limitations for ADL at home -> Very limited with ADL at home 5  Confident leaving home -> Not at all confident leaving home 3  Sleep soundly -> Do not sleep soundly because of lung condition 3  Lots of Energy -> No energy at all 5  TOTAL Score (max 40)  24    03/31/14 [facility] follow up  Admitted 7/12-14 or AECOPD and A/C CHF decompensation .  Presented with worsening dyspnea. Treated with IV steroids, abx ,  and transitioned to oral prednisone prior to discharge.  Improved with diuresis  .  Discharge on slow steroid taper along with levaquin .  No hemoptysis , chest pain , orthopnea or worsening edema.  Still weak but some better.       Review of Systems  Constitutional: Negative for fever and unexpected weight change.  HENT: Negative for congestion, dental problem, ear pain, nosebleeds, postnasal drip, rhinorrhea, sinus pressure, sneezing, sore throat and trouble swallowing.   Eyes: Negative for redness and itching.  Respiratory: Positive for cough and shortness of breath. Negative for chest tightness and wheezing.   Cardiovascular: Negative for palpitations and leg swelling.  Gastrointestinal: Negative for nausea and vomiting.  Genitourinary: Negative for dysuria.  Musculoskeletal: Negative for joint swelling.  Skin: Negative for rash.  Neurological: Negative for headaches.  Hematological: Does not bruise/bleed easily.  Psychiatric/Behavioral: Negative for dysphoric mood. The patient is not nervous/anxious.        Objective:   Physical Exam  GEN: A/Ox3; pleasant , NAD, elderly   HEENT:  [place]/AT,  EACs-clear, TMs-wnl, NOSE-clear, THROAT-clear, no lesions, no postnasal drip or exudate noted.   NECK:  Supple w/ fair ROM; no JVD; normal carotid impulses w/o bruits; no thyromegaly or nodules palpated; no lymphadenopathy.  RESP  Diminished BS in bases , no wheezing no  accessory muscle use, no dullness to percussion  CARD:  RRR, no m/r/g  , tr peripheral edema, pulses intact, no cyanosis or clubbing.  GI:   Soft & nt; nml bowel sounds; no organomegaly or masses detected.  Musco: Warm bil, no deformities or joint swelling noted.   Neuro: alert, no focal deficits noted.    Skin: Warm, no lesions or rashes        Assessment & Plan:

## 2014-04-03 NOTE — Assessment & Plan Note (Signed)
Resolving flare   Plan  Finish Levaquin as directed.  Taper prednisone as directed to 5mg  daily  Follow up Dr. Marchelle Gearingamaswamy in 4 weeks and As needed   Please contact office for sooner follow up if symptoms do not improve or worsen or seek emergency care

## 2014-04-03 NOTE — Assessment & Plan Note (Signed)
Recent decompensation now improved w/ diuresis  Return to baseline w/ no sign of vol overload.

## 2014-05-09 ENCOUNTER — Ambulatory Visit (INDEPENDENT_AMBULATORY_CARE_PROVIDER_SITE_OTHER): Payer: Medicare Other | Admitting: Internal Medicine

## 2014-05-09 ENCOUNTER — Encounter: Payer: Self-pay | Admitting: Internal Medicine

## 2014-05-09 VITALS — BP 128/56 | HR 62 | Ht 69.0 in | Wt 182.0 lb

## 2014-05-09 DIAGNOSIS — J4489 Other specified chronic obstructive pulmonary disease: Secondary | ICD-10-CM

## 2014-05-09 DIAGNOSIS — J449 Chronic obstructive pulmonary disease, unspecified: Secondary | ICD-10-CM

## 2014-05-09 DIAGNOSIS — I2510 Atherosclerotic heart disease of native coronary artery without angina pectoris: Secondary | ICD-10-CM

## 2014-05-09 NOTE — Progress Notes (Signed)
Subjective:    Patient ID: [name], male    DOB: 15-Sep-1928, 80 y.o.   MRN: [id number]  HPI    OV 02/15/2014  Chief Complaint  Patient presents with  . Follow-up    Pt c/o increase in SOB with any daily activities and increase in fatigue x 1 week. C/o mild cough with clear mucous. Denies CP/tightness.     #. 75 pack smoking hx. Quit 2010. Known CAD  #. COPD - Gold stage 3-4  On atrovent, pulmicort nebs, Xopenex and daily prednisone 5 mg - PFTs 11/21/2010  - spirometry only: Fev1 0.84L/31%, Ratio 45. Gold stage 3-4 COPD Spiro 04/26/2011 - fev1 1.06L/34% CAT score 17 - 02/04/2012 Did not desaturate May 2012 185 feet x 3 laps.   3. AECOPD hx:   -March 2012 (LLL PNA),   - April 2012 - clear cxr - Acute Visit 03/05/12 - OPD Rx - June 2013 - a fib and aECopd Admit - July 2013  - for HCAP - January 2013-admitted by Dr. Lutricia Feil for COPD exacerbation. Treated in the telemetry unit. No BiPAP - March 2014: Augmentin and prednisone outpatient treatment. Start N. Acetylcysteine  #IMaging hx  - 2005 CT scan chest no evidence of lung cancer - 2014 January chest x-ray no evidence of lung cancer - 2014 March: discussed - he is not interested in screening, "I do not want to know about it"    OV 12/09/2012  COPD followup. He continues to live in [place] [facility]. He does daily exercises. In January 2014 he was admitted for COPD exacerbation. He was doing well but a few days ago developed sinusitis. His  primary care physician Dr. Lutricia Feil  started him on Augmentin on 12/04/2012. He still has a few more days of Augmentin left. He is feeling better but he feels that rate of resolution is low. He feels there is a lot of chest congestion. He had tried increasing his daily prednisone from 5 mg to 10 mg but this has not helped. COPD cat score is 25 and significantly worse than baseline; all documented below  #COPD  You have mild attack of copd called COPD exacerbation  Avoid  exercises in the middle of a COPD exacerbation  Please take prednisone 74m once daily x 3 days, then 358mdaily x 3 days, 2029mnce daily x 3 days, then 65m22mce daily x 3 days, then 5mg 60me daily to continue  Start N. acetylcysteine 600 mg twice a day  - [name] published in [employer]014 shows it helps   - this is not to make you feel better but to to prevent recurrent bronchitis episodes  - You are more likely to get this drug at [employer] [facility] vitamin store then at [employer]  - You can also get this at website www.vWholesaleCream.sit functions as an anti-oxidant and there are minimal/none side effects  #Lung cancer screening - We discussed this but I respect your desire to postpone it  #Followup 4-5 months with COPD cat score at followup  OV 04/08/2013  FU COPD -- severe/very severe  Since lst OV has improved. [facility] Occ uses cane. COPD is stable. CAT score is 17 (see below) and reflects baseline activity. He is looking better an dfeeling better. Continues on duopneb q6h + qvar bid + xopnex prn + pred 5mg p60mday. Has not taken NAC. REltuctant for mdi changes  due to vA system logistics.  Only issue is recent cold and saw PMD who bumped his pred up and now back to [place]. He does not want to titrate prednisone down further; wants to remain at $RemoveB'5mg'aVLdvmRT$  per day   OV 10/11/2013  Chief Complaint  Patient presents with  . Follow-up    Pt just got out of the hospital friday. He had PNA. Pt c/o SOB, prod cough w/ white phlem, wheezing, chest tx.    FU COPD - Hospitalized around Christmas 2014 2 09/17/2013 under his primary care service. Apparently diagnosis was pneumonia not otherwise specified, review of imaging shows some right lower lobe infiltrate. I do not see any positive blood culture sputum culture or flu panel. He was then discharged to [facility]. Then on 10/08/2013 he was discharged to his assisted  living at [facility]. Physical therapy there is pending. He is improved from his recent hospitalization but he does feel deconditioned compared to baseline and more dyspneic compared to baseline. COPD cat score has declined back to 24. Is compliant with his nebulizers. He says that while in the hospital he was given some nebulizers Mucomyst that really helped bring out his sputum and he wants to give this a try. I've warned him about the paradoxical bronchospasm so he will try to judicious amounts  REC Glad you are better but understand you still have some way to go [facility], O2 and other nebs To help mucus clearance  - You can try mucomyst nebulizer Acetylcysteine 10% solution, 6 mL until nebulized. Do not try more than 4 times each month because it can make wheezing worse while trying to get rid of your sputum  FOllowup  3 months or sooner CAT score at followup  OV 11/08/2013  Chief Complaint  Patient presents with  . Acute Visit    Pt states Thursday he has a stomach virus and this made him weak. He  states since then he has been having increased SOB, chest congestion, productive cough wiht yellow phlegm.    Acute visit. Was feeling baseline. But on 11/04/2013 had some nausea with vomiting and later in the day diarrhea. But both resolved by end of the day 11/04/2013. Then on 11/05/2013 started noticing dyspnea on exertion that was worse than baseline. This is moderate in severity. Relieved by rest. Has been stable to slightly worse over the next few days. However today started having increasing cough compared to baseline and slight change in color of sputum to thick yellow. And also increased sputum volume.  He is frustrated that he does not have a new neb machine despite [place] putting in order. Also his Mucomyst nebulizers have not come through despite [place] talking to pharmacy and arranging 20% solution.   Dg Chest 2 View >>  01/17/2014 [facility] follow up    Returns for post hospital follow up .  Admitted 4/17-4/23 for COPD exacerbation, acute on chronic combined systolic and diastolic heart failure , and PNA .  Patient was treated with IV antibiotics, steroids, and nebulized bronchodilators. He did require BiPAP support briefly. Improved with diuresis. Chest x-ray prior to discharge showed improved right sided lung infiltrates.  Since discharge. Patient is improved with decreased cough, congestion. Patient does feel weak, and has not regained his energy level. Yet. He denies any hemoptysis, chest pain, orthopnea, PND, or leg swelling Patient has tapered his prednisone to 5 mg daily.   OV 02/15/2014  Chief Complaint  Patient presents with  .  Follow-up    Pt c/o increase in SOB with any daily activities and increase in fatigue x 1 week. C/o mild cough with clear mucous. Denies CP/tightness.    Followup severe COPD and multiple exacerbation  - Overall he is stable. He continues prednisone 5 mg per day. He is also on amiodarone and digoxin and bisoprolol for his cardiac issues along with Cardizem for atrial fibrillation.  COPD medications include DuoNeb. He is not on oxygen. He is also on oral morphine as needed for dyspnea. Morphine was started at the time of prior hospitalization and at discharge. Overall he is stable. He is quite functional.  Today he noticed increased dyspnea somewhat worse compared to baseline but without any associated worsening of cough, changes due to production or chest pain no orthopnea or paroxysmal nocturnal dyspnea or edema. COPD cat score is 24 and similar to baseline I suspect. Of note, sometime back I prescribed N-acetylcysteine mist at his request  but he has never tried this. He wants to try this today was dyspnea   03/31/14 [facility] follow up  Admitted 7/12-14 or AECOPD and A/C CHF decompensation .  Presented with worsening dyspnea. Treated with IV steroids, abx ,  and transitioned to oral prednisone prior to  discharge.  Improved with diuresis  .  Discharge on slow steroid taper along with levaquin .  No hemoptysis , chest pain , orthopnea or worsening edema.  Still weak but some better.    OV 05/09/2014  Chief Complaint  Patient presents with  . Follow-up    Pt states recently he has had an increase in SOB and fatigue. Pt states he is coughing very little and easily producing clear and white mucous.    FU COPD and chroic resp failure. On daily prednisone  Mid July admitted for AE-CHF NOS. Back at facilty. Doing well. BUt past 3 days increased tiredness, and more sleep. Says generally feeling listless but denies worsening resp distress or cough or sputum production or edema or chest pain. REports medication and exercise compliance. Does his ADLs.  No other localizing symptoms   CAT COPD Symptom & Quality of Life Score ([place]) 0 is no burden. 5 is highest burden 02/15/2014   Never Cough -> Cough all the time 1  No phlegm in chest -> Chest is full of phlegm 2  No chest tightness -> Chest feels very tight 0  No dyspnea for 1 flight stairs/hill -> Very dyspneic for 1 flight of stairs 5  No limitations for ADL at home -> Very limited with ADL at home 5  Confident leaving home -> Not at all confident leaving home 3  Sleep soundly -> Do not sleep soundly because of lung condition 3  Lots of Energy -> No energy at all 5  TOTAL Score (max 40)  24   Past, Family, Social reviewed: no change since last visit   Review of Systems  Constitutional: Positive for fatigue. Negative for fever and unexpected weight change.  HENT: Negative for congestion, dental problem, ear pain, nosebleeds, postnasal drip, rhinorrhea, sinus pressure, sneezing, sore throat and trouble swallowing.   Eyes: Negative for redness and itching.  Respiratory: Positive for cough and shortness of breath. Negative for chest tightness and wheezing.   Cardiovascular: Negative for palpitations and leg swelling.    Gastrointestinal: Negative for nausea and vomiting.  Genitourinary: Negative for dysuria.  Musculoskeletal: Negative for joint swelling.  Skin: Negative for rash.  Neurological: Positive for weakness. Negative for headaches.  Hematological: Does not bruise/bleed easily.  Psychiatric/Behavioral: Negative for dysphoric mood. The patient is not nervous/anxious.        Objective:   Physical Exam  Filed Vitals:   05/09/14 1456  BP: 128/56  Pulse: 62  Height: 5' 9"  (1.753 m)  Weight: 182 lb (82.555 kg)  SpO2: 94%    GEN: A/Ox3; pleasant , NAD, elderly   HEENT:  [place]/AT,  EACs-clear, TMs-wnl, NOSE-clear, THROAT-clear, no lesions, no postnasal drip or exudate noted.   NECK:  Supple w/ fair ROM; no JVD; normal carotid impulses w/o bruits; no thyromegaly or nodules palpated; no lymphadenopathy.  RESP  Diminished BS in bases , no wheezing no accessory muscle use, no dullness to percussion  CARD:  RRR, no m/r/g  , tr peripheral edema, pulses intact, no cyanosis or clubbing.  GI:   Soft & nt; nml bowel sounds; no organomegaly or masses detected.  Musco: Warm bil, no deformities or joint swelling noted.   Neuro: alert, no focal deficits noted.    Skin: Warm, no lesions or rashes        Assessment & Plan:  #COPD   - appears stable  - do not know what to make of your increased tiredness -please keep in touch; if this is getting worse or other symptoims develop call us immediately - otherwise continue daily prednisone  #FOllowup - 2 months or sooner if needed

## 2014-05-09 NOTE — Patient Instructions (Addendum)
#  COPD   - appears stable  - do not know what to make of your increased tiredness -please keep in touch; if this is getting worse or other symptoims develop call us immediately - otherwise continue daily prednisone  #FOllowup - 2 months or sooner if needed

## 2014-05-16 NOTE — Assessment & Plan Note (Signed)
#  COPD   - appears stable  - do not know what to make of your increased tiredness -please keep in touch; if this is getting worse or other symptoims develop call us immediately - otherwise continue daily prednisone  #FOllowup - 2 months or sooner if needed 

## 2014-05-19 ENCOUNTER — Telehealth: Payer: Self-pay | Admitting: Internal Medicine

## 2014-05-19 MED ORDER — DOXYCYCLINE HYCLATE 100 MG PO TABS: 100.0000 mg | ORAL_TABLET | Freq: Two times a day (BID) | ORAL | Status: DC

## 2014-05-19 MED ORDER — PREDNISONE 10 MG PO TABS: ORAL_TABLET | ORAL | Status: DC

## 2014-05-19 NOTE — Telephone Encounter (Signed)
Back on ae copd  Take doxycycline  po twice daily x 5 days; take after meals and avoid sunlight  Take prednisone 40 mg daily x 2 days, then  daily x 2 days, then  daily x 2 days, then  daily x 2 days and then to baseline prednisone whatever it is

## 2014-05-19 NOTE — Telephone Encounter (Signed)
Spoke with pt and advised of Dr Ramaswamy's recommendations.  Rx sent to pharmacy. 

## 2014-05-19 NOTE — Telephone Encounter (Signed)
Last ov 8.24.15 w/ MR > next 10.22.15 w/ MR Called spoke with patient who noticed some increased SOB yesterday evening, and reports this is worse today.  Some prod cough with white/clear mucus, tightness in chest.  He did take  prednisone this morning, but does not feel that it has offered much relief.  Pt denies any f/c/s, n/v/d, hemoptysis, PND, leg swelling.  He is requesting recommendations from MR.  [facility] [address] - verified  Dr Marchelle Gearing please advise, thank you. **MR paged at [phone]

## 2014-06-21 ENCOUNTER — Encounter: Payer: Self-pay | Admitting: Internal Medicine

## 2014-06-21 ENCOUNTER — Ambulatory Visit (INDEPENDENT_AMBULATORY_CARE_PROVIDER_SITE_OTHER): Payer: Medicare Other | Admitting: Internal Medicine

## 2014-06-21 VITALS — BP 102/58 | HR 61 | Ht 69.0 in | Wt 187.2 lb

## 2014-06-21 DIAGNOSIS — Z950 Presence of cardiac pacemaker: Secondary | ICD-10-CM

## 2014-06-21 DIAGNOSIS — I482 Chronic atrial fibrillation, unspecified: Secondary | ICD-10-CM

## 2014-06-21 DIAGNOSIS — I2510 Atherosclerotic heart disease of native coronary artery without angina pectoris: Secondary | ICD-10-CM

## 2014-06-21 DIAGNOSIS — I472 Ventricular tachycardia, unspecified: Secondary | ICD-10-CM

## 2014-06-21 DIAGNOSIS — I5042 Chronic combined systolic (congestive) and diastolic (congestive) heart failure: Secondary | ICD-10-CM

## 2014-06-21 LAB — MDC_IDC_ENUM_SESS_TYPE_INCLINIC
Battery Remaining Longevity: 132 mo
Brady Statistic RV Percent Paced: 53 %
Date Time Interrogation Session: 20151006142236
Implantable Pulse Generator Model: 1210
Implantable Pulse Generator Serial Number: 7207053
Lead Channel Impedance Value: 462.5 Ohm
Lead Channel Pacing Threshold Pulse Width: 0.4 ms
Lead Channel Setting Pacing Pulse Width: 0.4 ms
MDC IDC MSMT BATTERY VOLTAGE: 2.98 V
MDC IDC MSMT LEADCHNL RV PACING THRESHOLD AMPLITUDE: 0.75 V
MDC IDC MSMT LEADCHNL RV SENSING INTR AMPL: 12 mV
MDC IDC SET LEADCHNL RV PACING AMPLITUDE: 2.5 V
MDC IDC SET LEADCHNL RV SENSING SENSITIVITY: 2 mV

## 2014-06-21 NOTE — Assessment & Plan Note (Signed)
The patient has maintained a reasonable rate control in the setting of chronic atrial fibrillation. Because of his underlying lung disease, I recommended that he stop taking amiodarone as it was being used for rate control only. He is instructed to call us in several months if he notes an increase in his heart rate.

## 2014-06-21 NOTE — Patient Instructions (Addendum)
Your physician has recommended you make the following change in your medication:  1.) stop amiodarone  Your physician wants you to follow-up in: 1 year with Dr. Ladona Ridgelaylor.   You will receive a reminder letter in the mail two months in advance. If you don't receive a letter, please call our office to schedule the follow-up appointment.

## 2014-06-21 NOTE — Assessment & Plan Note (Signed)
His symptoms are class II and multifactorial. He is encouraged to reduce his sodium intake, and increase his physical activity. He will continue his current heart failure medications, but he has been asked to stop amiodarone.

## 2014-06-21 NOTE — Assessment & Plan Note (Signed)
His St. Jude single chamber pacemaker is working normally. We'll recheck in several months.

## 2014-06-21 NOTE — Progress Notes (Signed)
HPI [name] returns today for followup. He is a very pleasant 80 year old man with chronic atrial fibrillation, ventricular tachycardia, coronary artery disease, symptomatic bradycardia, status post pacemaker insertion. The patient also has chronic prostate problems and is anticoagulated for prevention of thromboembolic events. He has been on amiodarone and also notes that he has been in the hospital with sob. No Known Allergies   Current Outpatient Prescriptions  Medication Sig Dispense Refill  . albuterol (PROVENTIL) (2.5 MG/3ML) 0.083% nebulizer solution Take 2.5 mg by nebulization every 6 (six) hours as needed for wheezing or shortness of breath.      Marland Kitchen amiodarone (PACERONE) 200 MG tablet Take 1 tablet (200 mg total) by mouth daily.  30 tablet  1  . apixaban (ELIQUIS) 5 MG TABS tablet Take 5 mg by mouth 2 (two) times daily.      . beclomethasone (QVAR) 80 MCG/ACT inhaler Inhale 1 puff into the lungs 2 (two) times daily.      . bisoprolol (ZEBETA) 10 MG tablet Take 1 tablet (10 mg total) by mouth daily.  30 tablet  3  . cholecalciferol (VITAMIN D) 1000 UNITS tablet Take 2,000 Units by mouth daily.      . digoxin (LANOXIN) 0.125 MG tablet Take 0.125 mg by mouth daily.      Marland Kitchen diltiazem (DILACOR XR) 240 MG 24 hr capsule Take 240 mg by mouth daily.      . finasteride (PROSCAR) 5 MG tablet Take 5 mg by mouth daily.      . furosemide (LASIX) 20 MG tablet Take 20 mg by mouth daily.       Marland Kitchen glimepiride (AMARYL) 2 MG tablet Take 1 tablet (2 mg total) by mouth daily with breakfast.  30 tablet  6  . guaiFENesin (MUCINEX) 600 MG 12 hr tablet Take 600 mg by mouth at bedtime.       Marland Kitchen ipratropium (ATROVENT) 0.02 % nebulizer solution Take 2.5 mLs (0.5 mg total) by nebulization every 6 (six) hours.  75 mL  12  . isosorbide mononitrate (IMDUR) 60 MG 24 hr tablet Take 60 mg by mouth daily.        Marland Kitchen morphine 10 MG/5ML solution Take 2 mLs (4 mg total) by mouth 2 (two) times daily as needed (refractory  dyspnea).  120 mL  0  . nitroGLYCERIN (NITROSTAT) 0.4 MG SL tablet Place 0.4 mg under the tongue every 5 (five) minutes as needed (MAX 3 TABLETS). For chest pain      . predniSONE (DELTASONE) 5 MG tablet Take 5 mg by mouth daily with breakfast.      . rosuvastatin (CRESTOR) 40 MG tablet Take 40 mg by mouth daily.      . sennosides-docusate sodium (SENOKOT-S) 8.6-50 MG tablet Take 2 tablets by mouth at bedtime.       . Tamsulosin HCl (FLOMAX) 0.4 MG CAPS Take 0.4 mg by mouth 2 (two) times daily.       Marland Kitchen trimethoprim-polymyxin b (POLYTRIM) ophthalmic solution Place 2 drops into the right eye every 4 (four) hours as needed.       No current facility-administered medications for this visit.     Past Medical History  Diagnosis Date  . Pneumonia 12/06/10    healthcare-associated/ left lower lobe  . COPD (chronic obstructive pulmonary disease)     severe stage IV  . Atrial fibrillation     on Coumadin with St. Jude single-chamber pacemaker  . Diabetes mellitus     type 2; s/p Prednisone  therapy for pneumonia 12/2010  . Coronary artery disease     s/p anterior STEMI 09/2009; cath. revealed mid LAD 40%, distal LAD 100%- PTCA, prox. RCA 40%, mid. RCA 100% with good collateral filling of PDA; EF 25%; NSTEMI 07/2011 - PCI/DES 100% LCX  . ST elevation (STEMI) myocardial infarction 01//11/12    anterior  . CHF (congestive heart failure)     EF 25% on 09/26/10; EF 50-55% and grade 1 diastolic dysfunction on ECHO 08/2010   . Hypertension   . Hyperlipidemia   . AAA (abdominal aortic aneurysm)     3 cm infrarenal abdominal aortic aneurysm per aorta ultrasound 03/2010  . Osteoarthritis     s/p bilat hip arthroplasty  . Angina   . Ischemic cardiomyopathy     EF 35% LHC 11/12  . GERD (gastroesophageal reflux disease)   . Hypercholesteremia   . PVD (peripheral vascular disease)   . Anxiety     ROS:   All systems reviewed and negative except as noted in the HPI.   Past Surgical History   Procedure Laterality Date  . Hip arthroplasty      s/p right 09/27/10 and s/p left 09/24/10  . Back surgery    . Knee surgery    . Pacemaker insertion  12/2010    St. Jude single-chamber   . Cardiac catheterization  09/2009, 06/2007    PTCA to distal LAD     Family History  Problem Relation Age of Onset  . Heart attack Mother [id number]  . Heart attack Father [id number]  . Cancer Other     siblings  . Heart attack Other      History   Social History  . Marital Status: Widowed    Spouse Name: N/A    Number of Children: N/A  . Years of Education: N/A   Occupational History  . Not on file.   Social History Main Topics  . Smoking status: Former Smoker -- 2.00 packs/day for 50 years    Types: Cigarettes    Quit date: 09/02/2009  . Smokeless tobacco: Never Used     Comment: smoked for 2373yrs quit for 3864yrs started back & smoked for 10ys  . Alcohol Use: Yes     Comment: occ - alcohol  . Drug Use: No  . Sexual Activity: Not Currently   Other Topics Concern  . Not on file   Social History Narrative  . No narrative on file     BP 102/58  Pulse 61  Ht 5\' 9"  (1.753 m)  Wt 187 lb 3.2 oz (84.913 kg)  BMI 27.63 kg/m2  Physical Exam:  Well appearing elderly man, NAD HEENT: Unremarkable Neck:  No JVD, no thyromegall Back:  No CVA tenderness Lungs:  Clear with no wheezes, rales, or rhonchi. HEART:  IRegular rate rhythm, no murmurs, no rubs, no clicks Abd:  soft, positive bowel sounds, no organomegally, no rebound, no guarding Ext:  2 plus pulses, no edema, no cyanosis, no clubbing Skin:  No rashes no nodules Neuro:  CN II through XII intact, motor grossly intact   DEVICE  Normal device function.  See PaceArt for details.   Assess/Plan:

## 2014-06-22 ENCOUNTER — Encounter: Payer: Self-pay | Admitting: Internal Medicine

## 2014-07-07 ENCOUNTER — Ambulatory Visit: Payer: Medicare Other | Admitting: Internal Medicine

## 2014-07-12 ENCOUNTER — Encounter: Payer: Self-pay | Admitting: Internal Medicine

## 2014-07-12 ENCOUNTER — Ambulatory Visit (INDEPENDENT_AMBULATORY_CARE_PROVIDER_SITE_OTHER): Payer: Medicare Other | Admitting: Internal Medicine

## 2014-07-12 VITALS — BP 130/62 | HR 97 | Ht 69.0 in | Wt 186.8 lb

## 2014-07-12 DIAGNOSIS — I2510 Atherosclerotic heart disease of native coronary artery without angina pectoris: Secondary | ICD-10-CM

## 2014-07-12 DIAGNOSIS — J449 Chronic obstructive pulmonary disease, unspecified: Secondary | ICD-10-CM

## 2014-07-12 MED ORDER — ALPRAZOLAM 0.25 MG PO TABS: 0.2500 mg | ORAL_TABLET | Freq: Every evening | ORAL | Status: AC | PRN

## 2014-07-12 NOTE — Progress Notes (Signed)
Subjective:    Patient ID: [name], male    DOB: 12-28-1927, 80 y.o.   MRN: [id number]  HPI  #. 75 pack smoking hx. Quit 2010. Known CAD  #. COPD - Gold stage 3-4  On atrovent, pulmicort nebs, Xopenex and daily prednisone 5 mg - PFTs 11/21/2010  - spirometry only: Fev1 0.84L/31%, Ratio 45. Gold stage 3-4 COPD Spiro 04/26/2011 - fev1 1.06L/34% CAT score 17 - 02/04/2012 Did not desaturate May 2012 185 feet x 3 laps.   3. AECOPD hx:   -March 2012 (LLL PNA),   - April 2012 - clear cxr - Acute Visit 03/05/12 - OPD Rx - June 2013 - a fib and aECopd Admit - July 2013  - for HCAP - January 2013-admitted by Dr. Buren Kos for COPD exacerbation. Treated in the telemetry unit. No BiPAP - March 2014: Augmentin and prednisone outpatient treatment. Start N. Acetylcysteine  #IMaging hx  - 2005 CT scan chest no evidence of lung cancer - 2014 January chest x-ray no evidence of lung cancer - 2014 March: discussed - he is not interested in screening, "I do not want to know about it"   OV 07/12/2014  Chief Complaint  Patient presents with  . Follow-up    Patient continues to have sob and fatigue. Patient has some congestion with cough. Patient denies wheezing and chest tightness..    Follow-up chronic respiratory failure and severe COPD, prednisone-dependent with recurrent exacerbation history  - He comes for 2 month follow-up. Overall stable since the last visit. For the last 2-3 days he's had increased sputum production but unchanged color and unchanged volume. His baseline dyspnea chest tightness and wheeze are not any worse. He does not believe he is an exacerbation other than the fact that his sputum volume is higher. He has tried Mucinex in the past which has not helped. He tried an acetylcysteine and that does not help. He is resigned to chronic health with chronic medical problems and chronic symptomatology with gradual steady decline. He is not interested in recent studies. He  agrees that his focus should be more symptom-based. He is complaining of insomnia and wants some Xanax refills. He is up-to-date with his flu shot  Review of Systems  Constitutional: Negative for fever and unexpected weight change.  HENT: Positive for congestion. Negative for dental problem, ear pain, nosebleeds, postnasal drip, rhinorrhea, sinus pressure, sneezing, sore throat and trouble swallowing.   Eyes: Negative for redness and itching.  Respiratory: Positive for cough and shortness of breath. Negative for chest tightness and wheezing.   Cardiovascular: Negative for palpitations and leg swelling.  Gastrointestinal: Negative for nausea and vomiting.  Genitourinary: Negative for dysuria.  Musculoskeletal: Negative for joint swelling.  Skin: Negative for rash.  Neurological: Negative for headaches.  Hematological: Does not bruise/bleed easily.  Psychiatric/Behavioral: Negative for dysphoric mood. The patient is not nervous/anxious.    Current outpatient prescriptions:albuterol (PROVENTIL) (2.5 MG/3ML) 0.083% nebulizer solution, Take 2.5 mg by nebulization every 6 (six) hours as needed for wheezing or shortness of breath., Disp: , Rfl: ;  apixaban (ELIQUIS) 5 MG TABS tablet, Take 5 mg by mouth 2 (two) times daily., Disp: , Rfl: ;  beclomethasone (QVAR) 80 MCG/ACT inhaler, Inhale 1 puff into the lungs 2 (two) times daily., Disp: , Rfl:  cholecalciferol (VITAMIN D) 1000 UNITS tablet, Take 2,000 Units by mouth daily., Disp: , Rfl: ;  digoxin (LANOXIN) 0.125 MG tablet, Take 0.125 mg by mouth daily., Disp: , Rfl: ;  diltiazem (DILACOR XR) 240 MG 24 hr capsule, Take 240 mg by mouth daily., Disp: , Rfl: ;  finasteride (PROSCAR) 5 MG tablet, Take 5 mg by mouth daily., Disp: , Rfl: ;  furosemide (LASIX) 20 MG tablet, Take 20 mg by mouth daily. , Disp: , Rfl:  glimepiride (AMARYL) 2 MG tablet, Take 1 tablet (2 mg total) by mouth daily with breakfast., Disp: 30 tablet, Rfl: 6;  guaiFENesin (MUCINEX) 600 MG  12 hr tablet, Take 600 mg by mouth at bedtime. , Disp: , Rfl: ;  ipratropium (ATROVENT) 0.02 % nebulizer solution, Take 2.5 mLs (0.5 mg total) by nebulization every 6 (six) hours., Disp: 75 mL, Rfl: 12;  isosorbide mononitrate (IMDUR) 60 MG 24 hr tablet, Take 60 mg by mouth daily.  , Disp: , Rfl:  morphine 10 MG/5ML solution, Take 2 mLs (4 mg total) by mouth 2 (two) times daily as needed (refractory dyspnea)., Disp: 120 mL, Rfl: 0;  nitroGLYCERIN (NITROSTAT) 0.4 MG SL tablet, Place 0.4 mg under the tongue every 5 (five) minutes as needed (MAX 3 TABLETS). For chest pain, Disp: , Rfl: ;  predniSONE (DELTASONE) 5 MG tablet, Take 5 mg by mouth daily with breakfast., Disp: , Rfl:  rosuvastatin (CRESTOR) 40 MG tablet, Take 40 mg by mouth daily., Disp: , Rfl: ;  sennosides-docusate sodium (SENOKOT-S) 8.6-50 MG tablet, Take 2 tablets by mouth at bedtime. , Disp: , Rfl: ;  Tamsulosin HCl (FLOMAX) 0.4 MG CAPS, Take 0.4 mg by mouth 2 (two) times daily. , Disp: , Rfl: ;  trimethoprim-polymyxin b (POLYTRIM) ophthalmic solution, Place 2 drops into the right eye every 4 (four) hours as needed., Disp: , Rfl:  bisoprolol (ZEBETA) 10 MG tablet, Take 1 tablet (10 mg total) by mouth daily., Disp: 30 tablet, Rfl: 3      Objective:   Physical Exam   Filed Vitals:   07/12/14 1414  BP: 130/62  Pulse: 97  Height: 5\' 9"  (1.753 m)  Weight: 186 lb 12.8 oz (84.732 kg)  SpO2: 96%    GEN: A/Ox3; pleasant , NAD, elderly   HEENT:  [place]/AT,  EACs-clear, TMs-wnl, NOSE-clear, THROAT-clear, no lesions, no postnasal drip or exudate noted.   NECK:  Supple w/ fair ROM; no JVD; normal carotid impulses w/o bruits; no thyromegaly or nodules palpated; no lymphadenopathy.  RESP  Diminished BS in bases , no wheezing no accessory muscle use, no dullness to percussion  CARD:  RRR, no m/r/g  , tr peripheral edema, pulses intact, no cyanosis or clubbing.  GI:   Soft & nt; nml bowel sounds; no organomegaly or masses detected.  Musco:  Warm bil, no deformities or joint swelling noted.   Neuro: alert, no focal deficits noted.    Skin: Warm, no lesions or rashes       Assessment & Plan:  #COPD   - appears stable  - do not know what to make of your increased sputum production last few days -please keep in touch; if this is getting worse or other symptoims develop call us immediately and we can start copd flare up treatment - otherwise continue daily prednisone and your QVAR and nebulizers   #INsomnia  - try xanax 0.25mg  once at night as needed - will dispense 10 tablets total  - any further need, please talk to [name], William, [name]   #FOllowup - 2 months or sooner if needed; return to see my NP [name]    Dr. Kalman ShanMurali Arieal Cuoco, M.D., [facility] Pulmonary and Critical  Care Medicine Staff Physician  System [place] [facility] Pager: [phone], If no answer or between  15:00h - 7:00h: call [phone]  07/12/2014 2:37 PM

## 2014-07-12 NOTE — Patient Instructions (Addendum)
#  COPD   - appears stable  - do not know what to make of your increased sputum production last few days -please keep in touch; if this is getting worse or other symptoims develop call us immediately and we can start copd flare up treatment - otherwise continue daily prednisone and your QVAR and nebulizers   #INsomnia  - try xanax 0.25mg  once at night as needed - will dispense 10 tablets total  - any further need, please talk to Sula Rumplepcp Shaw, William, MD   #FOllowup - 2 months or sooner if needed; return to see my NP Tammy Parrett

## 2014-08-18 ENCOUNTER — Encounter: Payer: Self-pay | Admitting: Cardiology

## 2014-08-18 ENCOUNTER — Ambulatory Visit (INDEPENDENT_AMBULATORY_CARE_PROVIDER_SITE_OTHER): Payer: Medicare Other | Admitting: Cardiology

## 2014-08-18 VITALS — BP 128/62 | HR 71 | Ht 69.0 in | Wt 183.0 lb

## 2014-08-18 DIAGNOSIS — I200 Unstable angina: Secondary | ICD-10-CM

## 2014-08-18 DIAGNOSIS — I482 Chronic atrial fibrillation, unspecified: Secondary | ICD-10-CM

## 2014-08-18 DIAGNOSIS — J438 Other emphysema: Secondary | ICD-10-CM

## 2014-08-18 DIAGNOSIS — I5042 Chronic combined systolic (congestive) and diastolic (congestive) heart failure: Secondary | ICD-10-CM

## 2014-08-18 DIAGNOSIS — I472 Ventricular tachycardia, unspecified: Secondary | ICD-10-CM

## 2014-08-18 NOTE — Assessment & Plan Note (Signed)
The patient has not been having any recent chest pain or angina pectoris.

## 2014-08-18 NOTE — Assessment & Plan Note (Signed)
The patient has not been aware of any racing of his heart or dizzy spells or syncope.  His amiodarone was stopped by Dr. Ladona Ridgelaylor last month.

## 2014-08-18 NOTE — Patient Instructions (Signed)
Your physician recommends that you continue on your current medications as directed. Please refer to the Current Medication list given to you today.  Your physician wants you to follow-up in: 6 month ov You will receive a reminder letter in the mail two months in advance. If you don't receive a letter, please call our office to schedule the follow-up appointment.  

## 2014-08-18 NOTE — Progress Notes (Signed)
[name] Date of Birth:  03-09-1928 [facility] [address] [place], [place]  [address] [phone]        Fax   [phone]   History of Present Illness: This pleasant 80 year old gentleman is seen for a scheduled 6 month followup office visit. He has a past history of severe COPD and has had multiple hospitalizations in the past for exacerbation of COPD and exacerbation of diastolic congestive heart failure.he was  hospitalized in October 2014 for exacerbation of COPD. he was hospitalized again several months later for a similar problem of acute exacerbation of his COPD. He has a past history of acute on chronic combined systolic and diastolic heart failure and a past history of atrial fibrillation with rapid ventricular response. He has a functioning ventricular pacemaker and is followed by Dr. Ladona Ridgel. Since last visit he has been doing well. His breathing is much improved and he is not on home oxygen. He walks from his room to the dining hall with a walker.  Recently the [facility] switched him from Pradaxa to apixaban for his atrial fibrillation.  Current Outpatient Prescriptions  Medication Sig Dispense Refill  . albuterol (PROVENTIL) (2.5 MG/3ML) 0.083% nebulizer solution Take 2.5 mg by nebulization every 6 (six) hours as needed for wheezing or shortness of breath.    . ALPRAZolam (XANAX) 0.25 MG tablet Take 1 tablet (0.25 mg total) by mouth at bedtime as needed for anxiety. 10 tablet 0  . apixaban (ELIQUIS) 5 MG TABS tablet Take 5 mg by mouth 2 (two) times daily.    . beclomethasone (QVAR) 80 MCG/ACT inhaler Inhale 1 puff into the lungs 2 (two) times daily.    . cholecalciferol (VITAMIN D) 1000 UNITS tablet Take 2,000 Units by mouth daily.    . digoxin (LANOXIN) 0.125 MG tablet Take 0.125 mg by mouth daily.    Marland Kitchen diltiazem (DILACOR XR) 240 MG 24 hr capsule Take 240 mg by mouth daily.    . finasteride (PROSCAR) 5 MG tablet Take 5 mg by mouth daily.     . furosemide (LASIX) 20 MG tablet Take 20 mg by mouth daily.     Marland Kitchen glimepiride (AMARYL) 2 MG tablet Take 1 tablet (2 mg total) by mouth daily with breakfast. 30 tablet 6  . guaiFENesin (MUCINEX) 600 MG 12 hr tablet Take 600 mg by mouth at bedtime.     Marland Kitchen ipratropium (ATROVENT) 0.02 % nebulizer solution Take 2.5 mLs (0.5 mg total) by nebulization every 6 (six) hours. 75 mL 12  . isosorbide mononitrate (IMDUR) 60 MG 24 hr tablet Take 60 mg by mouth daily.      Marland Kitchen morphine 10 MG/5ML solution Take 2 mLs (4 mg total) by mouth 2 (two) times daily as needed (refractory dyspnea). 120 mL 0  . nitroGLYCERIN (NITROSTAT) 0.4 MG SL tablet Place 0.4 mg under the tongue every 5 (five) minutes as needed (MAX 3 TABLETS). For chest pain    . predniSONE (DELTASONE) 5 MG tablet Take 5 mg by mouth daily with breakfast.    . rosuvastatin (CRESTOR) 40 MG tablet Take 40 mg by mouth daily.    . sennosides-docusate sodium (SENOKOT-S) 8.6-50 MG tablet Take 2 tablets by mouth at bedtime.     . Tamsulosin HCl (FLOMAX) 0.4 MG CAPS Take 0.4 mg by mouth 2 (two) times daily.     Marland Kitchen trimethoprim-polymyxin b (POLYTRIM) ophthalmic solution Place 2 drops into the right eye every 4 (four) hours as needed.    Marland Kitchen  bisoprolol (ZEBETA) 10 MG tablet Take 1 tablet (10 mg total) by mouth daily. 30 tablet 3   No current facility-administered medications for this visit.    No Known Allergies  Patient Active Problem List   Diagnosis Date Noted  . COLD (chronic obstructive lung disease) 05/09/2014  . Atrial fibrillation 10/06/2013  . Hypertension 10/06/2013  . COPD with acute exacerbation 09/09/2013  . Ventricular tachycardia 06/15/2013  . Cancer screening, for lung 12/12/2012  . Chronic combined systolic and diastolic congestive heart failure 09/28/2012  . Leg weakness, bilateral 04/13/2012  . E-coli UTI 04/09/2012  . Hyponatremia 04/09/2012  . Chronic anticoagulation 04/04/2012    Class: Chronic  . Depression 03/16/2012    Class:  Chronic  . Type 2 diabetes mellitus 03/11/2012  . Chest pain 03/11/2012  . COPD (chronic obstructive pulmonary disease) 03/11/2012  . Permanent atrial fibrillation 03/11/2012  . Hypoxemia 03/10/2012  . Bronchitis 03/10/2012  . Pulmonary edema 03/10/2012  . Acute-on-chronic respiratory failure 11/15/2011  . Acute on chronic combined systolic and diastolic heart failure 11/11/2011  . Spiritual Distress 11/08/2011  . COPD exacerbation 11/07/2011  . Dyspnea 08/27/2011  . HLD (hyperlipidemia) 08/27/2011  . NSTEMI (non-ST elevated myocardial infarction) 08/15/2011  . CAD (coronary artery disease) 08/15/2011  . Unstable angina 08/13/2011  . Post-nasal drip 04/26/2011  . Pacemaker-St.Jude 04/02/2011  . CHF (congestive heart failure) 12/26/2010  . Abdominal aortic aneurysm 12/26/2010  . Emphysema/COPD 12/26/2010    History  Smoking status  . Former Smoker -- 2.00 packs/day for 50 years  . Types: Cigarettes  . Quit date: 09/02/2009  Smokeless tobacco  . Never Used    Comment: smoked for 112yrs quit for 1638yrs started back & smoked for 10ys    History  Alcohol Use  . Yes    Comment: occ - alcohol    Family History  Problem Relation Age of Onset  . Heart attack Mother [id number]  . Heart attack Father 1586  . Cancer Other     siblings  . Heart attack Other     Review of Systems: Constitutional: no fever chills diaphoresis or fatigue or change in weight.  Head and neck: no hearing loss, no epistaxis, no photophobia or visual disturbance. Respiratory: No cough, shortness of breath or wheezing. Cardiovascular: No chest pain peripheral edema, palpitations. Gastrointestinal: No abdominal distention, no abdominal pain, no change in bowel habits hematochezia or melena. Genitourinary: No dysuria, no frequency, no urgency, no nocturia. Musculoskeletal:No arthralgias, no back pain, no gait disturbance or myalgias. Neurological: No dizziness, no headaches, no numbness, no seizures, no syncope,  no weakness, no tremors. Hematologic: No lymphadenopathy, no easy bruising. Psychiatric: No confusion, no hallucinations, no sleep disturbance.    Physical Exam: Filed Vitals:   08/18/14 1409  BP: 128/62  Pulse: 71   the general appearance reveals a well-developed well-nourished gentleman in no distress.The head and neck exam reveals pupils equal and reactive.  Extraocular movements are full.  There is no scleral icterus.  The mouth and pharynx are normal.  The neck is supple.  The carotids reveal no bruits.  The jugular venous pressure is normal.  The  thyroid is not enlarged.  There is no lymphadenopathy.  The chest is clear to percussion and auscultation.  There is increased AP diameter consistent with COPD.  The breath sounds are generally distant  There are no rales or rhonchi.  Expansion of the chest is symmetrical.  The precordium is quiet.  The first heart sound is normal.  The  second heart sound is physiologically split.  There is no murmur gallop rub or click.  There is no abnormal lift or heave.  The abdomen is soft and nontender.  The bowel sounds are normal.  The liver and spleen are not enlarged.  There are no abdominal masses.  There are no abdominal bruits.  Extremities reveal good pedal pulses.  There is no phlebitis or edema.  There is no cyanosis or clubbing.  Strength is normal and symmetrical in all extremities.  There is no lateralizing weakness.  There are no sensory deficits.  The skin is warm and dry.  There is no rash.     Assessment / Plan: 1. COPD 2. permanent atrial fibrillation. 3. past history of paroxysmal ventricular tachycardia , no longer on amiodarone. 4.  Chronic combined systolic and diastolic congestive heart failure 5. ischemic heart disease with prior non-STEMI. 6.  Functioning ventricular pacemaker followed by Dr. Ladona Ridgelaylor  Disposition: Continue on same medication.  Recheck in 6 months for followup office visit.

## 2014-08-18 NOTE — Assessment & Plan Note (Signed)
The patient appears to be at his dry weight.  Breathing is not labored at rest but he is dyspneic with minimal exertion

## 2014-08-24 ENCOUNTER — Encounter (HOSPITAL_COMMUNITY): Payer: Self-pay | Admitting: Cardiovascular Disease

## 2014-09-20 ENCOUNTER — Encounter: Payer: Self-pay | Admitting: Adult Health

## 2014-09-20 ENCOUNTER — Ambulatory Visit (INDEPENDENT_AMBULATORY_CARE_PROVIDER_SITE_OTHER): Payer: Medicare Other | Admitting: Adult Health

## 2014-09-20 VITALS — BP 106/64 | HR 68 | Temp 98.0°F | Ht 69.0 in | Wt 183.0 lb

## 2014-09-20 DIAGNOSIS — J449 Chronic obstructive pulmonary disease, unspecified: Secondary | ICD-10-CM

## 2014-09-20 NOTE — Progress Notes (Signed)
Subjective:    Patient ID: [name], male    DOB: 1928/04/28, 81 y.o.   MRN: [id number]  HPI  #. 75 pack smoking hx. Quit 2010. Known CAD  #. COPD - Gold stage 3-4  On atrovent, pulmicort nebs, Xopenex and daily prednisone 5 mg - PFTs 11/21/2010  - spirometry only: Fev1 0.84L/31%, Ratio 45. Gold stage 3-4 COPD Spiro 04/26/2011 - fev1 1.06L/34% CAT score 17 - 02/04/2012 Did not desaturate May 2012 185 feet x 3 laps.   3. AECOPD hx:   -March 2012 (LLL PNA),   - April 2012 - clear cxr - Acute Visit 03/05/12 - OPD Rx - June 2013 - a fib and aECopd Admit - July 2013  - for HCAP - January 2013-admitted by Dr. Buren Kosouglas shaw for COPD exacerbation. Treated in the telemetry unit. No BiPAP - March 2014: Augmentin and prednisone outpatient treatment. Start N. Acetylcysteine  #IMaging hx  - 2005 CT scan chest no evidence of lung cancer - 2014 January chest x-ray no evidence of lung cancer - 2014 March: discussed - he is not interested in screening, "I do not want to know about it"   OV 07/12/2014  Chief Complaint  Patient presents with  . Follow-up    Patient continues to have sob and fatigue. Patient has some congestion with cough. Patient denies wheezing and chest tightness..    Follow-up chronic respiratory failure and severe COPD, prednisone-dependent with recurrent exacerbation history  - He comes for 2 month follow-up. Overall stable since the last visit. For the last 2-3 days he's had increased sputum production but unchanged color and unchanged volume. His baseline dyspnea chest tightness and wheeze are not any worse. He does not believe he is an exacerbation other than the fact that his sputum volume is higher. He has tried Mucinex in the past which has not helped. He tried an acetylcysteine and that does not help. He is resigned to chronic health with chronic medical problems and chronic symptomatology with gradual steady decline. He is not interested in recent studies. He  agrees that his focus should be more symptom-based. He is complaining of insomnia and wants some Xanax refills. He is up-to-date with his flu shot   09/20/2014 Follow up chronic respiratory failure and severe COPD, prednisone-dependent Patient returns for a two-month follow-up. Says overall that his breathing has been doing fairly well for him. Feels that his shortness of breath is at baseline He remains on Qvar twice daily, Atrovent nebulizer 4 times daily and prednisone 5 mg daily He denies any chest pain, orthopnea, PND, leg swelling, hemoptysis, or unintentional weight loss.    Review of Systems  Constitutional:   No  weight loss, night sweats,  Fevers, chills,  +fatigue, or  lassitude.  HEENT:   No headaches,  Difficulty swallowing,  Tooth/dental problems, or  Sore throat,                No sneezing, itching, ear ache, nasal congestion, post nasal drip,   CV:  No chest pain,  Orthopnea, PND, swelling in lower extremities, anasarca, dizziness, palpitations, syncope.   GI  No heartburn, indigestion, abdominal pain, nausea, vomiting, diarrhea, change in bowel habits, loss of appetite, bloody stools.   Resp:    No chest wall deformity  Skin: no rash or lesions.  GU: no dysuria, change in color of urine, no urgency or frequency.  No flank pain, no hematuria   MS:  No joint pain or swelling.  No  decreased range of motion.  No back pain.  Psych:  No change in mood or affect. No depression or anxiety.  No memory loss.          Objective:   Physical Exam   GEN: A/Ox3; pleasant , NAD, elderly   HEENT:  [place]/AT,  EACs-clear, TMs-wnl, NOSE-clear, THROAT-clear, no lesions, no postnasal drip or exudate noted.   NECK:  Supple w/ fair ROM; no JVD; normal carotid impulses w/o bruits; no thyromegaly or nodules palpated; no lymphadenopathy.  RESP  Diminished BS in bases , no wheezing no accessory muscle use, no dullness to percussion  CARD:  RRR, no m/r/g  , tr peripheral edema,  pulses intact, no cyanosis or clubbing.  GI:   Soft & nt; nml bowel sounds; no organomegaly or masses detected.  Musco: Warm bil, no deformities or joint swelling noted.   Neuro: alert, no focal deficits noted.    Skin: Warm, no lesions or rashes       Assessment & Plan:

## 2014-09-20 NOTE — Assessment & Plan Note (Signed)
Compensated on present regimen No changes in present regimen Follow up in 3 months and as needed

## 2014-09-20 NOTE — Patient Instructions (Signed)
Continue on current regimen  Follow up Dr. Marchelle Gearingamaswamy in 3 months and As needed   Please contact office for sooner follow up if symptoms do not improve or worsen or seek emergency care

## 2015-01-04 ENCOUNTER — Encounter: Payer: Self-pay | Admitting: Cardiology

## 2015-01-16 ENCOUNTER — Encounter: Payer: Self-pay | Admitting: Internal Medicine

## 2015-01-16 ENCOUNTER — Ambulatory Visit (INDEPENDENT_AMBULATORY_CARE_PROVIDER_SITE_OTHER): Payer: Medicare Other | Admitting: Internal Medicine

## 2015-01-16 VITALS — BP 114/70 | HR 73 | Ht 69.0 in | Wt 183.0 lb

## 2015-01-16 DIAGNOSIS — R0982 Postnasal drip: Secondary | ICD-10-CM | POA: Insufficient documentation

## 2015-01-16 DIAGNOSIS — J329 Chronic sinusitis, unspecified: Secondary | ICD-10-CM

## 2015-01-16 DIAGNOSIS — J449 Chronic obstructive pulmonary disease, unspecified: Secondary | ICD-10-CM | POA: Diagnosis not present

## 2015-01-16 DIAGNOSIS — J9611 Chronic respiratory failure with hypoxia: Secondary | ICD-10-CM | POA: Diagnosis not present

## 2015-01-16 MED ORDER — PREDNISONE 10 MG PO TABS: ORAL_TABLET | ORAL | Status: DC

## 2015-01-16 MED ORDER — FLUTICASONE PROPIONATE 50 MCG/ACT NA SUSP: 2.0000 | Freq: Every day | NASAL | Status: AC

## 2015-01-16 NOTE — Addendum Note (Signed)
Addended by: Nicanor AlconNAGLE, Waldron Gerry K on: 01/16/2015 03:03 PM   Modules accepted: Orders

## 2015-01-16 NOTE — Progress Notes (Signed)
Subjective:    Patient ID: [name], male    DOB: [birth year], 81 y.o.   MRN: [id number]  HPI  #. 75 pack smoking hx. Quit 2010. Known CAD  #. COPD - Gold stage 3-4  On atrovent, pulmicort nebs, Xopenex and daily prednisone 5 mg - PFTs 11/21/2010  - spirometry only: Fev1 0.84L/31%, Ratio 45. Gold stage 3-4 COPD Spiro 04/26/2011 - fev1 1.06L/34% CAT score 17 - 02/04/2012 Did not desaturate May 2012 185 feet x 3 laps.   3. AECOPD hx:   -March 2012 (LLL PNA),   - April 2012 - clear cxr - Acute Visit 03/05/12 - OPD Rx - June 2013 - a fib and aECopd Admit - July 2013  - for HCAP - January 2013-admitted by Dr. Buren Kosouglas shaw for COPD exacerbation. Treated in the telemetry unit. No BiPAP - March 2014: Augmentin and prednisone outpatient treatment. Start N. Acetylcysteine  #IMaging hx  - 2005 CT scan chest no evidence of lung cancer - 2014 January chest x-ray no evidence of lung cancer - 2014 March: discussed - he is not interested in screening, "I do not want to know about it"    OV 01/16/2015   Chief Complaint  Patient presents with  . Follow-up    Pt c/o increase in SOB, rhinorrhea, cough with little mucus production. Pt denies CP/tightness.      Follow-up chronic respiratory failure and severe COPD, prednisone-dependent with recurrent exacerbation history  Comes for routine follow-up. As always he has multitude of respiratory complain's and never feels optimal. This time he tells me that he is more short of breath for the last 3 or 4 days. He self initiated a bump in his prednisone dose but this has not helped. Symptoms are not associated with other exacerbation symptoms suggest worsening cough or wheezing or sputum increase in volume a change in color. He is having some spring related sinus drainage but he has not taken his nasal inhalers. In fact he does not even know what they are. He is looking for a change in his nebulizer regimen. Currently he is on DuoNeb and Qvar. He  gets these through the [facility]. Last visit insomnia was an issue but this time he is not complaining about it    has a past medical history of Pneumonia (12/06/10); COPD (chronic obstructive pulmonary disease); Atrial fibrillation; Diabetes mellitus; Coronary artery disease; ST elevation (STEMI) myocardial infarction (01//11/12); CHF (congestive heart failure); Hypertension; Hyperlipidemia; AAA (abdominal aortic aneurysm); Osteoarthritis; Angina; Ischemic cardiomyopathy; GERD (gastroesophageal reflux disease); Hypercholesteremia; PVD (peripheral vascular disease); and Anxiety.   reports that he quit smoking about 5 years ago. His smoking use included Cigarettes. He has a 100 pack-year smoking history. He has never used smokeless tobacco.  Past Surgical History  Procedure Laterality Date  . Hip arthroplasty      s/p right 09/27/10 and s/p left 09/24/10  . Back surgery    . Knee surgery    . Pacemaker insertion  12/2010    St. Jude single-chamber   . Cardiac catheterization  09/2009, 06/2007    PTCA to distal LAD  . Percutaneous coronary stent intervention (pci-s) N/A 08/13/2011    Procedure: PERCUTANEOUS CORONARY STENT INTERVENTION (PCI-S);  Surgeon: Tonny BollmanMichael Cooper, MD;  [facility];  Service: Cardiovascular;  Laterality: N/A;  . Left heart catheterization with coronary angiogram N/A 08/13/2011    Procedure: LEFT HEART CATHETERIZATION WITH CORONARY ANGIOGRAM;  Surgeon: Tonny BollmanMichael Cooper, MD;  [facility];  Service: Cardiovascular;  Laterality: N/A;    No Known Allergies  Immunization History  Administered Date(s) Administered  . Influenza Split 05/18/2011, 06/16/2012, 06/16/2013, 06/16/2014  . Pneumococcal Conjugate-13 06/16/2013  . Pneumococcal Polysaccharide-23 05/17/2010    Family History  Problem Relation Age of Onset  . Heart attack Mother 73  . Heart attack Father 71  . Cancer Other     siblings  . Heart attack Other      Current outpatient prescriptions:    .  albuterol (PROVENTIL) (2.5 MG/3ML) 0.083% nebulizer solution, Take 2.5 mg by nebulization every 6 (six) hours as needed for wheezing or shortness of breath., Disp: , Rfl:  .  ALPRAZolam (XANAX) 0.25 MG tablet, Take 1 tablet (0.25 mg total) by mouth at bedtime as needed for anxiety., Disp: 10 tablet, Rfl: 0 .  apixaban (ELIQUIS) 5 MG TABS tablet, Take 5 mg by mouth 2 (two) times daily., Disp: , Rfl:  .  bisoprolol (ZEBETA) 10 MG tablet, Take 1 tablet (10 mg total) by mouth daily., Disp: 30 tablet, Rfl: 3 .  cholecalciferol (VITAMIN D) 1000 UNITS tablet, Take 2,000 Units by mouth daily., Disp: , Rfl:  .  digoxin (LANOXIN) 0.125 MG tablet, Take 0.125 mg by mouth daily., Disp: , Rfl:  .  diltiazem (DILACOR XR) 240 MG 24 hr capsule, Take 240 mg by mouth daily., Disp: , Rfl:  .  finasteride (PROSCAR) 5 MG tablet, Take 5 mg by mouth daily., Disp: , Rfl:  .  furosemide (LASIX) 20 MG tablet, Take 20 mg by mouth daily. , Disp: , Rfl:  .  glimepiride (AMARYL) 2 MG tablet, Take 1 tablet (2 mg total) by mouth daily with breakfast., Disp: 30 tablet, Rfl: 6 .  ipratropium (ATROVENT) 0.02 % nebulizer solution, Take 2.5 mLs (0.5 mg total) by nebulization every 6 (six) hours., Disp: 75 mL, Rfl: 12 .  isosorbide mononitrate (IMDUR) 60 MG 24 hr tablet, Take 60 mg by mouth daily.  , Disp: , Rfl:  .  morphine 10 MG/5ML solution, Take 2 mLs (4 mg total) by mouth 2 (two) times daily as needed (refractory dyspnea)., Disp: 120 mL, Rfl: 0 .  nitroGLYCERIN (NITROSTAT) 0.4 MG SL tablet, Place 0.4 mg under the tongue every 5 (five) minutes as needed (MAX 3 TABLETS). For chest pain, Disp: , Rfl:  .  predniSONE (DELTASONE) 5 MG tablet, Take 5 mg by mouth daily with breakfast., Disp: , Rfl:  .  rosuvastatin (CRESTOR) 40 MG tablet, Take 40 mg by mouth daily., Disp: , Rfl:  .  sennosides-docusate sodium (SENOKOT-S) 8.6-50 MG tablet, Take 2 tablets by mouth daily as needed. , Disp: , Rfl:  .  Tamsulosin HCl (FLOMAX) 0.4 MG CAPS,  Take 0.4 mg by mouth 2 (two) times daily. , Disp: , Rfl:  .  trimethoprim-polymyxin b (POLYTRIM) ophthalmic solution, Place 2 drops into the right eye every 4 (four) hours as needed., Disp: , Rfl:  .  beclomethasone (QVAR) 80 MCG/ACT inhaler, Inhale 1 puff into the lungs 2 (two) times daily., Disp: , Rfl:  .  guaiFENesin (MUCINEX) 600 MG 12 hr tablet, Take 600 mg by mouth at bedtime. , Disp: , Rfl:     Review of Systems  Constitutional: Negative for fever and unexpected weight change.  HENT: Negative for congestion, dental problem, ear pain, nosebleeds, postnasal drip, rhinorrhea, sinus pressure, sneezing, sore throat and trouble swallowing.   Eyes: Negative for redness and itching.  Respiratory: Positive for cough and shortness of breath. Negative for chest tightness and  wheezing.   Cardiovascular: Negative for palpitations and leg swelling.  Gastrointestinal: Negative for nausea and vomiting.  Genitourinary: Negative for dysuria.  Musculoskeletal: Negative for joint swelling.  Skin: Negative for rash.  Neurological: Negative for headaches.  Hematological: Does not bruise/bleed easily.  Psychiatric/Behavioral: Negative for dysphoric mood. The patient is not nervous/anxious.        Objective:   Physical Exam  Filed Vitals:   01/16/15 1430  BP: 114/70  Pulse: 73  Height:  (1.753 m)  Weight: 183 lb (83.008 kg)  SpO2: 98%      GEN: A/Ox3; pleasant , NAD, elderly   HEENT:  [place]/AT,  EACs-clear, TMs-wnl, NOSE-clear, THROAT-clear, no lesions, POST NASAL DRIIP +  NECK:  Supple w/ fair ROM; no JVD; normal carotid impulses w/o bruits; no thyromegaly or nodules palpated; no lymphadenopathy.  RESP  Diminished BS in bases , no wheezing no accessory muscle use, no dullness to percussion  CARD:  RRR, no m/r/g  , tr peripheral edema, pulses intact, no cyanosis or clubbing.  GI:   Soft & nt; nml bowel sounds; no organomegaly or masses detected.  Musco: Warm bil, no deformities or  joint swelling noted.   Neuro: alert, no focal deficits noted.    Skin: Warm, no lesions or rashes     Assessment & Plan:     ICD-9-CM ICD-10-CM   1. COPD, severe 496 J44.9   2. Chronic respiratory failure with hypoxia 518.83 J96.11    799.02    3. Post-nasal drainage 473.9 J32.9      #COPD   - unclear why your are more short of breath - increase prednisone in case this is a flare up  - Take prednisone 40 mg daily x 2 days, then  daily x 2 days, then  daily x 2 days, then  daily to continue  - continue nebulzier + QVAR - let us see if change to newer mdi can help your symptoms  - will send prescription to the [facility] for once daily Spiriva Respimat and low-dose Brio one puff once daily  #Postnasal drainage - start generic fluticasone inhaler 2 squirts each nostril daily      #FOllowup - 2 months to see my NP Tammy Parrett   Dr. Kalman Shan, M.D., [facility].C.P Pulmonary and Critical Care Medicine Staff Physician [facility] [place] [facility] Pager: [phone], If no answer or between  15:00h - 7:00h: call [phone]  01/16/2015 3:00 PM

## 2015-01-16 NOTE — Patient Instructions (Addendum)
ICD-9-CM ICD-10-CM   1. COPD, severe 496 J44.9   2. Chronic respiratory failure with hypoxia 518.83 J96.11    799.02    3. Post-nasal drainage 473.9 J32.9      #COPD   - unclear why your are more short of breath - increase prednisone in case this is a flare up  - Take prednisone 40 mg daily x 2 days, then 20mg  daily x 2 days, then 10mg  daily x 2 days, then 5mg  daily to continue  - continue nebulzier + QVAR - let us see if change to newer mdi can help your symptoms  - [facility] once daily Spiriva Respimat and low-dose Brio one puff once daily  #Postnasal drainage - start generic fluticasone inhaler 2 squirts each nostril daily      #FOllowup - 2 months to see my NP Tammy Parrett

## 2015-03-24 ENCOUNTER — Ambulatory Visit: Payer: Medicare Other | Admitting: Adult Health

## 2015-03-29 ENCOUNTER — Ambulatory Visit (INDEPENDENT_AMBULATORY_CARE_PROVIDER_SITE_OTHER)
Admission: RE | Admit: 2015-03-29 | Discharge: 2015-03-29 | Disposition: A | Discharge status: Home / Self Care | Payer: Medicare Other | Source: Ambulatory Visit | Attending: Adult Health | Admitting: Adult Health

## 2015-03-29 ENCOUNTER — Encounter: Payer: Self-pay | Admitting: Adult Health

## 2015-03-29 ENCOUNTER — Other Ambulatory Visit: Payer: Self-pay | Admitting: Adult Health

## 2015-03-29 ENCOUNTER — Ambulatory Visit (INDEPENDENT_AMBULATORY_CARE_PROVIDER_SITE_OTHER): Payer: Medicare Other | Admitting: Adult Health

## 2015-03-29 VITALS — BP 122/80 | HR 86 | Temp 98.6°F | Ht 69.0 in | Wt 182.0 lb

## 2015-03-29 DIAGNOSIS — J449 Chronic obstructive pulmonary disease, unspecified: Secondary | ICD-10-CM

## 2015-03-29 DIAGNOSIS — I5042 Chronic combined systolic (congestive) and diastolic (congestive) heart failure: Secondary | ICD-10-CM

## 2015-03-29 DIAGNOSIS — J441 Chronic obstructive pulmonary disease with (acute) exacerbation: Secondary | ICD-10-CM | POA: Diagnosis not present

## 2015-03-29 MED ORDER — BUDESONIDE-FORMOTEROL FUMARATE 160-4.5 MCG/ACT IN AERO: 2.0000 | INHALATION_SPRAY | Freq: Two times a day (BID) | RESPIRATORY_TRACT | Status: DC

## 2015-03-29 NOTE — Patient Instructions (Signed)
Increase Prednisone 10mg  daily for 1 week then back to 5mg   Continue on Spiriva daily  Begin Symbicort 160 2 puffs Twice daily  , will fax to [facility] to see if covered .  Chest xray today .  follow up Dr. Marchelle Gearingamaswamy in 3 months and As needed   Please contact office for sooner follow up if symptoms do not improve or worsen or seek emergency care

## 2015-03-29 NOTE — Progress Notes (Signed)
Subjective:    Patient ID: [name], male    DOB: [birth year], 81 y.o.   MRN: [id number]  HPI 81  yo male with COPD Gold 3-4  PFTs 11/21/2010 - spirometry only: Fev1 0.84L/31%, Ratio 45. Gold stage 3-4 COPD Gets meds thru [facility]  Has CHF and AFib   03/29/2015 Follow up : COPD  Returns for 3 month follow up .  Had flare last ov , given steroid taper.  On chronic steroids at 5mg  daily  Started on BREO last ov, but not covered and he can not afford.  Helped some but gets winded easily .  Back on QVAR but does not feel it helps.  He denies chest pain,orthopnea, increased edema , fever, or hemoptysis .  PVX and Prevnar utd.    Past Medical History  Diagnosis Date  . Pneumonia 12/06/10    healthcare-associated/ left lower lobe  . COPD (chronic obstructive pulmonary disease)     severe stage IV  . Atrial fibrillation     on Coumadin with St. Jude single-chamber pacemaker  . Diabetes mellitus     type 2; s/p Prednisone therapy for pneumonia 12/2010  . Coronary artery disease     s/p anterior STEMI 09/2009; cath. revealed mid LAD 40%, distal LAD 100%- PTCA, prox. RCA 40%, mid. RCA 100% with good collateral filling of PDA; EF 25%; NSTEMI 07/2011 - PCI/DES 100% LCX  . ST elevation (STEMI) myocardial infarction 01//11/12    anterior  . CHF (congestive heart failure)     EF 25% on 09/26/10; EF 50-55% and grade 1 diastolic dysfunction on ECHO 08/2010   . Hypertension   . Hyperlipidemia   . AAA (abdominal aortic aneurysm)     3 cm infrarenal abdominal aortic aneurysm per aorta ultrasound 03/2010  . Osteoarthritis     s/p bilat hip arthroplasty  . Angina   . Ischemic cardiomyopathy     EF 35% LHC 11/12  . GERD (gastroesophageal reflux disease)   . Hypercholesteremia   . PVD (peripheral vascular disease)   . Anxiety      Current outpatient prescriptions:  .  albuterol (PROVENTIL) (2.5 MG/3ML) 0.083% nebulizer solution, Take 2.5 mg by nebulization every 6 (six) hours as  needed for wheezing or shortness of breath., Disp: , Rfl:  .  ALPRAZolam (XANAX) 0.25 MG tablet, Take 1 tablet (0.25 mg total) by mouth at bedtime as needed for anxiety., Disp: 10 tablet, Rfl: 0 .  apixaban (ELIQUIS) 5 MG TABS tablet, Take 5 mg by mouth 2 (two) times daily., Disp: , Rfl:  .  beclomethasone (QVAR) 80 MCG/ACT inhaler, Inhale 1 puff into the lungs 2 (two) times daily., Disp: , Rfl:  .  bisoprolol (ZEBETA) 10 MG tablet, Take 1 tablet (10 mg total) by mouth daily., Disp: 30 tablet, Rfl: 3 .  cholecalciferol (VITAMIN D) 1000 UNITS tablet, Take 2,000 Units by mouth daily., Disp: , Rfl:  .  digoxin (LANOXIN) 0.125 MG tablet, Take 0.125 mg by mouth daily., Disp: , Rfl:  .  diltiazem (DILACOR XR) 240 MG 24 hr capsule, Take 240 mg by mouth daily., Disp: , Rfl:  .  finasteride (PROSCAR) 5 MG tablet, Take 5 mg by mouth daily., Disp: , Rfl:  .  fluticasone (FLONASE) 50 MCG/ACT nasal spray, Place 2 sprays into both nostrils daily. (Patient taking differently: Place 2 sprays into both nostrils daily as needed. ), Disp: 16 g, Rfl: 2 .  furosemide (LASIX) 20 MG tablet, Take 20 mg  by mouth daily. , Disp: , Rfl:  .  glimepiride (AMARYL) 2 MG tablet, Take 1 tablet (2 mg total) by mouth daily with breakfast., Disp: 30 tablet, Rfl: 6 .  guaiFENesin (MUCINEX) 600 MG 12 hr tablet, Take 600 mg by mouth at bedtime. , Disp: , Rfl:  .  ipratropium (ATROVENT) 0.02 % nebulizer solution, Take 2.5 mLs (0.5 mg total) by nebulization every 6 (six) hours., Disp: 75 mL, Rfl: 12 .  morphine 10 MG/5ML solution, Take 2 mLs (4 mg total) by mouth 2 (two) times daily as needed (refractory dyspnea)., Disp: 120 mL, Rfl: 0 .  nitroGLYCERIN (NITROSTAT) 0.4 MG SL tablet, Place 0.4 mg under the tongue every 5 (five) minutes as needed (MAX 3 TABLETS). For chest pain, Disp: , Rfl:  .  predniSONE (DELTASONE) 5 MG tablet, Take 5 mg by mouth daily with breakfast., Disp: , Rfl:  .  rosuvastatin (CRESTOR) 40 MG tablet, Take 40 mg by  mouth daily., Disp: , Rfl:  .  sennosides-docusate sodium (SENOKOT-S) 8.6-50 MG tablet, Take 2 tablets by mouth daily as needed. , Disp: , Rfl:  .  Tamsulosin HCl (FLOMAX) 0.4 MG CAPS, Take 0.4 mg by mouth 2 (two) times daily. , Disp: , Rfl:  .  tiotropium (SPIRIVA HANDIHALER) 18 MCG inhalation capsule, Place 18 mcg into inhaler and inhale daily., Disp: , Rfl:  .  traMADol (ULTRAM) 50 MG tablet, Take 50 mg by mouth every 8 (eight) hours as needed. for pain, Disp: , Rfl: 5 .  budesonide-formoterol (SYMBICORT) 160-4.5 MCG/ACT inhaler, Inhale 2 puffs into the lungs 2 (two) times daily., Disp: 1 Inhaler, Rfl: 6    Review of Systems Constitutional:   No  weight loss, night sweats,  Fevers, chills,  +fatigue, or  lassitude.  HEENT:   No headaches,  Difficulty swallowing,  Tooth/dental problems, or  Sore throat,                No sneezing, itching, ear ache,  +nasal congestion, post nasal drip,   CV:  No chest pain,  Orthopnea, PND, swelling in lower extremities, anasarca, dizziness, palpitations, syncope.   GI  No heartburn, indigestion, abdominal pain, nausea, vomiting, diarrhea, change in bowel habits, loss of appetite, bloody stools.   Resp:    No chest wall deformity  Skin: no rash or lesions.  GU: no dysuria, change in color of urine, no urgency or frequency.  No flank pain, no hematuria   MS:  No joint pain or swelling.  No decreased range of motion.  No back pain.  Psych:  No change in mood or affect. No depression or anxiety.  No memory loss.         Objective:   Physical Exam  GEN: A/Ox3; pleasant , NAD, elderly , chronically ill appearing, on walker   HEENT:  [place]/AT,  EACs-clear, TMs-wnl, NOSE-clear, THROAT-clear, no lesions, no postnasal drip or exudate noted.   NECK:  Supple w/ fair ROM; no JVD; normal carotid impulses w/o bruits; no thyromegaly or nodules palpated; no lymphadenopathy.  RESP  Decreased BS in bases ; w/o, wheezes/ rales/ or rhonchi.no accessory muscle  use, no dullness to percussion  CARD: Irregular , no m/r/g  , no peripheral edema, pulses intact, no cyanosis or clubbing.  GI:   Soft & nt; nml bowel sounds; no organomegaly or masses detected.  Musco: Warm bil, no deformities or joint swelling noted.   Neuro: alert, no focal deficits noted.    Skin: Warm, no lesions  or rashes        Assessment & Plan:

## 2015-03-29 NOTE — Assessment & Plan Note (Signed)
Appears compensated without fluid overload. 

## 2015-03-29 NOTE — Assessment & Plan Note (Signed)
Severe COPD -mild flare  Check cxr   Plan  ncrease Prednisone 10mg  daily for 1 week then back to 5mg   Continue on Spiriva daily  Begin Symbicort 160 2 puffs Twice daily  , [facility] to [facility] to see if covered .  Chest xray today .  follow up Dr. Marchelle Gearingamaswamy in 3 months and As needed   Please contact office for sooner follow up if symptoms do not improve or worsen or seek emergency care

## 2015-04-03 NOTE — Patient Outreach (Signed)
[facility] Care Management  04/03/2015  Jeanella AntonJoe B Henkes 1928-07-12 [id number]   Referral from High Risk List, assigned George Inaavina Green, RN to outreach.  Corrie MckusickLisa O. Sharlee BlewMoore, AABA [facility] Care Management [facility] CM Assistant Phone: [phone] Fax: [phone]

## 2015-04-06 ENCOUNTER — Telehealth: Payer: Self-pay | Admitting: Internal Medicine

## 2015-04-06 NOTE — Telephone Encounter (Signed)
New Message  Pt calling to speak w/ RN about possible Tenze unit for PT. Please call back and discuss.

## 2015-04-07 ENCOUNTER — Other Ambulatory Visit: Payer: Self-pay

## 2015-04-07 NOTE — Telephone Encounter (Signed)
LMTCB//sss 

## 2015-04-07 NOTE — Telephone Encounter (Signed)
Follow up     Patient calling back to speak with nurse regarding Tenze unit.

## 2015-04-07 NOTE — Patient Outreach (Signed)
[facility]) Care Management  04/07/2015  [name] [birth year] [id number]  Telephone call to patient regarding  High risk list referral.  HIPAA confirmed with patient.  Discussed and offered [facility] care management services to patient.  Patient refused services.  Patient states he presently resides at [facility] independent living.  Patient states he is doing fine.  PLAN: RNCM will refer patient to Nena Polio to close due to refusal of services.  RNCM will notify patients primary MD.  .[name] RN,BSN,CCM [facility] Telephonic Care Coordinator [phone]

## 2015-04-10 ENCOUNTER — Encounter: Payer: Self-pay | Admitting: Internal Medicine

## 2015-04-10 NOTE — Telephone Encounter (Signed)
Spoke with patient- rehab would like to use TENZ unit on lower back and previously has used it with no problem.  Attempted to call rehab facility at [facility] to set up a time for [facility] rep to check pt while using TENZ to rule out interference. LM on VM to call back.

## 2015-04-10 NOTE — Telephone Encounter (Signed)
[name] at 04/10/2015 10:26 AM     Status: Signed       Expand All Collapse All   New message     Pt returning call regarding Tenze unit.  Please call to discuss

## 2015-04-10 NOTE — Telephone Encounter (Signed)
This encounter was created in error - please disregard.

## 2015-04-10 NOTE — Telephone Encounter (Signed)
New message     Pt returning call regarding Tenze unit.  Please call to discuss

## 2015-04-11 NOTE — Telephone Encounter (Signed)
Follow Up        Physical Therapist returning [name]'s phone call.

## 2015-04-11 NOTE — Telephone Encounter (Signed)
Spoke with Barth Kirks in Physical Therapy at [facility].  Clarified that PT needs clearance from Dr. Ladona Ridgel that there is no interference between PPM and "E-stim" device before patient can be prescribed TENS unit.  Scheduled [employer] representative to be present for device interrogation at [facility] on 04/12/15 at 1:00pm during "e-stim" therapy.  [employer] rep will forward device interrogation to [facility] for review in order to clear for TENS unit.

## 2015-04-11 NOTE — Patient Outreach (Signed)
[facility]) Care Management  04/11/2015  [name] 31-Mar-1928 [id number]   Notification from [name], RN to close case due to patient refused [facility] Care Management services.  Corrie Mckusick. Sharlee Blew [facility] Care Management [facility] CM Assistant Phone: [phone] Fax: [phone]

## 2015-04-14 ENCOUNTER — Telehealth: Payer: Self-pay | Admitting: *Deleted

## 2015-04-14 ENCOUNTER — Encounter: Payer: Self-pay | Admitting: *Deleted

## 2015-04-14 NOTE — Telephone Encounter (Signed)
Autumn Messing in PT to notify her that per [employer] representative, patient did not experience any interference with his pacemaker during TENS use.  Letter stating this information faxed to [facility] per request from PT department.  Barth Kirks voiced understanding and denied any additional questions or concerns.

## 2015-07-12 ENCOUNTER — Encounter: Payer: Self-pay | Admitting: Internal Medicine

## 2015-07-12 ENCOUNTER — Ambulatory Visit (INDEPENDENT_AMBULATORY_CARE_PROVIDER_SITE_OTHER): Payer: Medicare Other | Admitting: Internal Medicine

## 2015-07-12 VITALS — BP 124/64 | HR 84 | Ht 69.0 in | Wt 185.0 lb

## 2015-07-12 DIAGNOSIS — I482 Chronic atrial fibrillation, unspecified: Secondary | ICD-10-CM

## 2015-07-12 DIAGNOSIS — I4821 Permanent atrial fibrillation: Secondary | ICD-10-CM

## 2015-07-12 DIAGNOSIS — I5043 Acute on chronic combined systolic (congestive) and diastolic (congestive) heart failure: Secondary | ICD-10-CM

## 2015-07-12 DIAGNOSIS — Z950 Presence of cardiac pacemaker: Secondary | ICD-10-CM

## 2015-07-12 LAB — CUP PACEART INCLINIC DEVICE CHECK
Date Time Interrogation Session: 20161026133310
Implantable Lead Implant Date: 20120402
Lead Channel Pacing Threshold Amplitude: 0.75 V
Lead Channel Pacing Threshold Amplitude: 0.75 V
Lead Channel Pacing Threshold Pulse Width: 0.4 ms
Lead Channel Pacing Threshold Pulse Width: 0.4 ms
Lead Channel Sensing Intrinsic Amplitude: 12 mV
Lead Channel Setting Sensing Sensitivity: 2 mV
MDC IDC LEAD LOCATION: 753860
MDC IDC MSMT BATTERY REMAINING LONGEVITY: 134.4
MDC IDC MSMT BATTERY VOLTAGE: 2.98 V
MDC IDC MSMT LEADCHNL RV IMPEDANCE VALUE: 462.5 Ohm
MDC IDC SET LEADCHNL RV PACING AMPLITUDE: 2.5 V
MDC IDC SET LEADCHNL RV PACING PULSEWIDTH: 0.4 ms
MDC IDC STAT BRADY RV PERCENT PACED: 36 %
Pulse Gen Model: 1210
Pulse Gen Serial Number: 7207053

## 2015-07-12 NOTE — Assessment & Plan Note (Signed)
His symptoms are class 2. He will continue his current meds.  

## 2015-07-12 NOTE — Assessment & Plan Note (Signed)
His ventricular rate is well controlled. No change in meds. 

## 2015-07-12 NOTE — Assessment & Plan Note (Signed)
His [employer] device is working normally. Will recheck in several months.  

## 2015-07-12 NOTE — Patient Instructions (Signed)
Medication Instructions:  Your physician recommends that you continue on your current medications as directed. Please refer to the Current Medication list given to you today.  Labwork: None ordered  Testing/Procedures: None ordered  Follow-Up: Your physician wants you to follow-up in: 1 year with Dr. Taylor.  You will receive a reminder letter in the mail two months in advance. If you don't receive a letter, please call our office to schedule the follow-up appointment.   Any Other Special Instructions Will Be Listed Below (If Applicable).  If you need a refill on your cardiac medications before your next appointment, please call your pharmacy.  Thank you for choosing [facility]!!         

## 2015-07-12 NOTE — Progress Notes (Signed)
HPI Mr. [name] returns today for followup. He is a very pleasant 81 year old man with chronic atrial fibrillation, ventricular tachycardia, coronary artery disease, symptomatic bradycardia, status post pacemaker insertion. The patient is anticoagulated for prevention of thromboembolic events. No Known Allergies   Current Outpatient Prescriptions  Medication Sig Dispense Refill  . albuterol (PROVENTIL) (2.5 MG/3ML) 0.083% nebulizer solution Take 2.5 mg by nebulization every 6 (six) hours as needed for wheezing or shortness of breath.    . ALPRAZolam (XANAX) 0.25 MG tablet Take 1 tablet (0.25 mg total) by mouth at bedtime as needed for anxiety. 10 tablet 0  . apixaban (ELIQUIS) 5 MG TABS tablet Take 5 mg by mouth 2 (two) times daily.    . beclomethasone (QVAR) 80 MCG/ACT inhaler Inhale 1 puff into the lungs 2 (two) times daily.    . bisoprolol (ZEBETA) 10 MG tablet Take 1 tablet (10 mg total) by mouth daily. 30 tablet 3  . budesonide-formoterol (SYMBICORT) 160-4.5 MCG/ACT inhaler Inhale 2 puffs into the lungs 2 (two) times daily. 1 Inhaler 6  . cholecalciferol (VITAMIN D) 1000 UNITS tablet Take 2,000 Units by mouth daily.    . digoxin (LANOXIN) 0.125 MG tablet Take 0.125 mg by mouth daily.    Marland Kitchen diltiazem (DILACOR XR) 240 MG 24 hr capsule Take 240 mg by mouth daily.    . finasteride (PROSCAR) 5 MG tablet Take 5 mg by mouth daily.    . fluticasone (FLONASE) 50 MCG/ACT nasal spray Place 2 sprays into both nostrils daily. (Patient taking differently: Place 2 sprays into both nostrils daily as needed. ) 16 g 2  . furosemide (LASIX) 20 MG tablet Take 20 mg by mouth daily.     Marland Kitchen glimepiride (AMARYL) 2 MG tablet Take 1 tablet (2 mg total) by mouth daily with breakfast. 30 tablet 6  . guaiFENesin (MUCINEX) 600 MG 12 hr tablet Take 600 mg by mouth at bedtime.     Marland Kitchen ipratropium (ATROVENT) 0.02 % nebulizer solution Take 2.5 mLs (0.5 mg total) by nebulization every 6 (six) hours. 75 mL 12  . morphine 10  MG/5ML solution Take 2 mLs (4 mg total) by mouth 2 (two) times daily as needed (refractory dyspnea). 120 mL 0  . nitroGLYCERIN (NITROSTAT) 0.4 MG SL tablet Place 0.4 mg under the tongue every 5 (five) minutes as needed (MAX 3 TABLETS). For chest pain    . predniSONE (DELTASONE) 5 MG tablet Take 5 mg by mouth daily with breakfast.    . rosuvastatin (CRESTOR) 40 MG tablet Take 40 mg by mouth daily.    . sennosides-docusate sodium (SENOKOT-S) 8.6-50 MG tablet Take 2 tablets by mouth daily as needed.     . Tamsulosin HCl (FLOMAX) 0.4 MG CAPS Take 0.4 mg by mouth 2 (two) times daily.     Marland Kitchen tiotropium (SPIRIVA HANDIHALER) 18 MCG inhalation capsule Place 18 mcg into inhaler and inhale daily.    . traMADol (ULTRAM) 50 MG tablet Take 50 mg by mouth every 8 (eight) hours as needed. for pain  5   No current facility-administered medications for this visit.     Past Medical History  Diagnosis Date  . Pneumonia 12/06/10    healthcare-associated/ left lower lobe  . COPD (chronic obstructive pulmonary disease) (HCC)     severe stage IV  . Atrial fibrillation (HCC)     on Coumadin with [employer] single-chamber pacemaker  . Diabetes mellitus     type 2; s/p Prednisone therapy for pneumonia 12/2010  . Coronary  artery disease     s/p anterior STEMI 09/2009; cath. revealed mid LAD 40%, distal LAD 100%- PTCA, prox. RCA 40%, mid. RCA 100% with good collateral filling of PDA; EF 25%; NSTEMI 07/2011 - PCI/DES 100% LCX  . ST elevation (STEMI) myocardial infarction (HCC) 01//11/12    anterior  . CHF (congestive heart failure) (HCC)     EF 25% on 09/26/10; EF 50-55% and grade 1 diastolic dysfunction on ECHO 08/2010   . Hypertension   . Hyperlipidemia   . AAA (abdominal aortic aneurysm) (HCC)     3 cm infrarenal abdominal aortic aneurysm per aorta ultrasound 03/2010  . Osteoarthritis     s/p bilat hip arthroplasty  . Angina   . Ischemic cardiomyopathy     EF 35% LHC 11/12  . GERD (gastroesophageal reflux  disease)   . Hypercholesteremia   . PVD (peripheral vascular disease) (HCC)   . Anxiety     ROS:   All systems reviewed and negative except as noted in the HPI.   Past Surgical History  Procedure Laterality Date  . Hip arthroplasty      s/p right 09/27/10 and s/p left 09/24/10  . Back surgery    . Knee surgery    . Pacemaker insertion  12/2010    St. Jude single-chamber   . Cardiac catheterization  09/2009, 06/2007    PTCA to distal LAD  . Percutaneous coronary stent intervention (pci-s) N/A 08/13/2011    Procedure: PERCUTANEOUS CORONARY STENT INTERVENTION (PCI-S);  Surgeon: Tonny BollmanMichael Cooper, MD;  [facility];  Service: Cardiovascular;  Laterality: N/A;  . Left heart catheterization with coronary angiogram N/A 08/13/2011    Procedure: LEFT HEART CATHETERIZATION WITH CORONARY ANGIOGRAM;  Surgeon: Tonny BollmanMichael Cooper, MD;  [facility];  Service: Cardiovascular;  Laterality: N/A;     Family History  Problem Relation Age of Onset  . Heart attack Mother 3871  . Heart attack Father 2486  . Cancer Other     siblings  . Heart attack Other      Social History   Social History  . Marital Status: Widowed    Spouse Name: N/A  . Number of Children: N/A  . Years of Education: N/A   Occupational History  . Not on file.   Social History Main Topics  . Smoking status: Former Smoker -- 2.00 packs/day for 50 years    Types: Cigarettes    Quit date: 09/02/2009  . Smokeless tobacco: Never Used     Comment: smoked for 822yrs quit for 5245yrs started back & smoked for 10ys  . Alcohol Use: Yes     Comment: occ - alcohol  . Drug Use: No  . Sexual Activity: Not Currently   Other Topics Concern  . Not on file   Social History Narrative     BP 124/64 mmHg  Pulse 84  Ht 5\' 9"  (1.753 m)  Wt 185 lb (83.915 kg)  BMI 27.31 kg/m2  Physical Exam:  Well appearing elderly man, NAD HEENT: Unremarkable Neck:  No JVD, no thyromegall Back:  No CVA tenderness Lungs:  Clear  with no wheezes, rales, or rhonchi. HEART:  IRegular rate rhythm, no murmurs, no rubs, no clicks Abd:  soft, positive bowel sounds, no organomegally, no rebound, no guarding Ext:  2 plus pulses, no edema, no cyanosis, no clubbing Skin:  No rashes no nodules Neuro:  CN II through XII intact, motor grossly intact   DEVICE  Normal device function.  See PaceArt for  details.   Assess/Plan:

## 2015-07-22 ENCOUNTER — Other Ambulatory Visit: Payer: Self-pay | Admitting: Internal Medicine

## 2015-07-22 DIAGNOSIS — G8190 Hemiplegia, unspecified affecting unspecified side: Secondary | ICD-10-CM

## 2015-08-04 ENCOUNTER — Other Ambulatory Visit: Payer: Self-pay | Admitting: *Deleted

## 2015-08-07 ENCOUNTER — Other Ambulatory Visit: Payer: Medicare Other

## 2015-08-17 ENCOUNTER — Other Ambulatory Visit: Payer: Medicare Other

## 2015-08-21 ENCOUNTER — Ambulatory Visit
Admission: RE | Admit: 2015-08-21 | Discharge: 2015-08-21 | Disposition: A | Discharge status: Home / Self Care | Payer: Medicare Other | Source: Ambulatory Visit | Attending: Internal Medicine | Admitting: Internal Medicine

## 2015-08-21 DIAGNOSIS — G8190 Hemiplegia, unspecified affecting unspecified side: Secondary | ICD-10-CM

## 2015-08-21 MED ORDER — IOPAMIDOL (ISOVUE-300) INJECTION 61%
75.0000 mL | Freq: Once | INTRAVENOUS | Status: AC | PRN
  Administered 2015-08-21: 75 mL via INTRAVENOUS

## 2015-09-27 ENCOUNTER — Encounter: Payer: Self-pay | Admitting: Internal Medicine

## 2015-09-27 ENCOUNTER — Ambulatory Visit (INDEPENDENT_AMBULATORY_CARE_PROVIDER_SITE_OTHER): Payer: Medicare Other | Admitting: Internal Medicine

## 2015-09-27 VITALS — BP 102/64 | HR 77 | Ht 69.0 in | Wt 183.0 lb

## 2015-09-27 DIAGNOSIS — J449 Chronic obstructive pulmonary disease, unspecified: Secondary | ICD-10-CM | POA: Diagnosis not present

## 2015-09-27 DIAGNOSIS — J9611 Chronic respiratory failure with hypoxia: Secondary | ICD-10-CM | POA: Diagnosis not present

## 2015-09-27 NOTE — Patient Instructions (Signed)
ICD-9-CM ICD-10-CM   1. COPD, severe (HCC) 496 J44.9   2. Chronic respiratory failure with hypoxia (HCC) 518.83 J96.11    799.02     Stable disease  Plan Ensure  [facility] Continue your medications as before Respect your decision to refuse Daliresp due to polypharmacy Glad you're up-to-date with respiratory vaccines  Follow-up 6 months or sooner if needed

## 2015-09-27 NOTE — Progress Notes (Signed)
Subjective:     Patient ID: [name], male   DOB: [birth year], 82 y.o.   MRN: [id number]  HPI   OV 09/27/2015  Chief Complaint  Patient presents with  . Follow-up    Pt states he has had an increase in prod cough with white mucus x 2 days. Pt states his SOB is at baseline. Pt denies CP/tightness.    Follow-up advanced COPD with chronic respiratory failure. Assisted living patient's on chronic prednisone. No new interim problems. No new admissions or hospitalizations according to his history. No exacerbations. He says that yesterday he felt like he was getting a cold but today seems to be over it. Today is a down day. But it is still within the normal range of fluctuation. He denies any feeling of having a COPD exacerbation at this point. We discussed Daliresp and he doesn't want to do it. [facility]. Immunization reviewed and he is up-to-date with respiratory vaccines.   Current outpatient prescriptions:  .  albuterol (PROVENTIL) (2.5 MG/3ML) 0.083% nebulizer solution, Take 2.5 mg by nebulization every 6 (six) hours as needed for wheezing or shortness of breath., Disp: , Rfl:  .  ALPRAZolam (XANAX) 0.25 MG tablet, Take 1 tablet (0.25 mg total) by mouth at bedtime as needed for anxiety., Disp: 10 tablet, Rfl: 0 .  apixaban (ELIQUIS) 5 MG TABS tablet, Take 5 mg by mouth 2 (two) times daily., Disp: , Rfl:  .  beclomethasone (QVAR) 80 MCG/ACT inhaler, Inhale 1 puff into the lungs 2 (two) times daily., Disp: , Rfl:  .  cholecalciferol (VITAMIN D) 1000 UNITS tablet, Take 2,000 Units by mouth daily., Disp: , Rfl:  .  digoxin (LANOXIN) 0.125 MG tablet, Take 0.125 mg by mouth daily., Disp: , Rfl:  .  diltiazem (DILACOR XR) 240 MG 24 hr capsule, Take 240 mg by mouth daily., Disp: , Rfl:  .  finasteride (PROSCAR) 5 MG tablet, Take 5 mg by mouth daily., Disp: , Rfl:  .  fluticasone (FLONASE) 50 MCG/ACT nasal spray, Place 2 sprays into both nostrils daily. (Patient taking  differently: Place 2 sprays into both nostrils daily as needed. ), Disp: 16 g, Rfl: 2 .  furosemide (LASIX) 20 MG tablet, Take 20 mg by mouth daily. , Disp: , Rfl:  .  glimepiride (AMARYL) 2 MG tablet, Take 1 tablet (2 mg total) by mouth daily with breakfast., Disp: 30 tablet, Rfl: 6 .  guaiFENesin (MUCINEX) 600 MG 12 hr tablet, Take 600 mg by mouth at bedtime. , Disp: , Rfl:  .  ipratropium (ATROVENT) 0.02 % nebulizer solution, Take 2.5 mLs (0.5 mg total) by nebulization every 6 (six) hours., Disp: 75 mL, Rfl: 12 .  nitroGLYCERIN (NITROSTAT) 0.4 MG SL tablet, Place 0.4 mg under the tongue every 5 (five) minutes as needed (MAX 3 TABLETS). For chest pain, Disp: , Rfl:  .  predniSONE (DELTASONE) 5 MG tablet, Take 5 mg by mouth daily with breakfast., Disp: , Rfl:  .  rosuvastatin (CRESTOR) 40 MG tablet, Take 40 mg by mouth daily., Disp: , Rfl:  .  sennosides-docusate sodium (SENOKOT-S) 8.6-50 MG tablet, Take 2 tablets by mouth daily as needed. , Disp: , Rfl:  .  Tamsulosin HCl (FLOMAX) 0.4 MG CAPS, Take 0.4 mg by mouth 2 (two) times daily. , Disp: , Rfl:  .  tiotropium (SPIRIVA HANDIHALER) 18 MCG inhalation capsule, Place 18 mcg into inhaler and inhale daily., Disp: , Rfl:  .  traMADol (  ULTRAM) 50 MG tablet, Take 50 mg by mouth every 8 (eight) hours as needed. for pain, Disp: , Rfl: 5 .  bisoprolol (ZEBETA) 10 MG tablet, Take 1 tablet (10 mg total) by mouth daily., Disp: 30 tablet, Rfl: 3 .  morphine 10 MG/5ML solution, Take 2 mLs (4 mg total) by mouth 2 (two) times daily as needed (refractory dyspnea). (Patient not taking: Reported on 09/27/2015), Disp: 120 mL, Rfl: 0      Immunization History  Administered Date(s) Administered  . Influenza Split 05/18/2011, 06/16/2012, 06/16/2013, 06/16/2014  . Influenza,inj,Quad PF,36+ Mos 07/18/2015  . Pneumococcal Conjugate-13 06/16/2013  . Pneumococcal Polysaccharide-23 05/17/2010      No Known Allergies     Review of Systems     Objective:    Physical Exam  Constitutional: He is oriented to person, place, and time. He appears well-developed and well-nourished. No distress.  HENT:  Head: Normocephalic and atraumatic.  Right Ear: External ear normal.  Left Ear: External ear normal.  Mouth/Throat: Oropharynx is clear and moist. No oropharyngeal exudate.  Eyes: Conjunctivae and EOM are normal. Pupils are equal, round, and reactive to light. Right eye exhibits no discharge. Left eye exhibits no discharge. No scleral icterus.  Neck: Normal range of motion. Neck supple. No JVD present. No tracheal deviation present. No thyromegaly present.  Cardiovascular: Normal rate, regular rhythm and intact distal pulses.  Exam reveals no gallop and no friction rub.   No murmur heard. Pulmonary/Chest: Effort normal and breath sounds normal. No respiratory distress. He has no wheezes. He has no rales. He exhibits no tenderness.  Barrel chest  Abdominal: Soft. Bowel sounds are normal. He exhibits no distension and no mass. There is no tenderness. There is no rebound and no guarding.  Musculoskeletal: Normal range of motion. He exhibits no edema or tenderness.  Walks with a walker  Lymphadenopathy:    He has no cervical adenopathy.  Neurological: He is alert and oriented to person, place, and time. He has normal reflexes. No cranial nerve deficit. Coordination normal.  Skin: Skin is warm and dry. No rash noted. He is not diaphoretic. No erythema. No pallor.  Psychiatric: He has a normal mood and affect. His behavior is normal. Judgment and thought content normal.  Nursing note and vitals reviewed.   Filed Vitals:   09/27/15 1334  BP: 102/64  Pulse: 77  Height: 5\' 9"  (1.753 m)  Weight: 183 lb (83.008 kg)  SpO2: 95%         Assessment:       ICD-9-CM ICD-10-CM   1. COPD, severe (HCC) 496 J44.9   2. Chronic respiratory failure with hypoxia (HCC) 518.83 J96.11    799.02         Plan:      Stable disease  Plan Ensure  you get  refills through the [facility] Continue your medications as before Respect your decision to refuse Daliresp due to polypharmacy Glad you're up-to-date with respiratory vaccines  Follow-up 6 months or sooner if needed    Dr. Kalman Shan, M.D., [facility]C.P Pulmonary and Critical Care Medicine Staff Physician [facility] Pager: [phone], If no answer or between  15:00h - 7:00h: call [phone]  09/27/2015 1:52 PM

## 2015-09-30 ENCOUNTER — Encounter (HOSPITAL_COMMUNITY): Payer: Self-pay | Admitting: Oncology

## 2015-09-30 ENCOUNTER — Emergency Department (HOSPITAL_COMMUNITY): Payer: Medicare Other

## 2015-09-30 ENCOUNTER — Inpatient Hospital Stay (HOSPITAL_COMMUNITY)
Admission: EM | Admit: 2015-09-30 | Discharge: 2015-10-17 | DRG: 190 | Disposition: A | Discharge status: Skilled Nursing Facility | Payer: Medicare Other | Attending: Internal Medicine | Admitting: Internal Medicine

## 2015-09-30 DIAGNOSIS — J449 Chronic obstructive pulmonary disease, unspecified: Secondary | ICD-10-CM

## 2015-09-30 DIAGNOSIS — K219 Gastro-esophageal reflux disease without esophagitis: Secondary | ICD-10-CM | POA: Diagnosis present

## 2015-09-30 DIAGNOSIS — E1151 Type 2 diabetes mellitus with diabetic peripheral angiopathy without gangrene: Secondary | ICD-10-CM | POA: Diagnosis present

## 2015-09-30 DIAGNOSIS — Z515 Encounter for palliative care: Secondary | ICD-10-CM

## 2015-09-30 DIAGNOSIS — Z96643 Presence of artificial hip joint, bilateral: Secondary | ICD-10-CM | POA: Diagnosis present

## 2015-09-30 DIAGNOSIS — Z9981 Dependence on supplemental oxygen: Secondary | ICD-10-CM

## 2015-09-30 DIAGNOSIS — I959 Hypotension, unspecified: Secondary | ICD-10-CM | POA: Diagnosis not present

## 2015-09-30 DIAGNOSIS — J9620 Acute and chronic respiratory failure, unspecified whether with hypoxia or hypercapnia: Secondary | ICD-10-CM | POA: Diagnosis present

## 2015-09-30 DIAGNOSIS — F32A Depression, unspecified: Secondary | ICD-10-CM | POA: Diagnosis present

## 2015-09-30 DIAGNOSIS — Z7901 Long term (current) use of anticoagulants: Secondary | ICD-10-CM

## 2015-09-30 DIAGNOSIS — R0602 Shortness of breath: Secondary | ICD-10-CM | POA: Diagnosis present

## 2015-09-30 DIAGNOSIS — I10 Essential (primary) hypertension: Secondary | ICD-10-CM | POA: Diagnosis present

## 2015-09-30 DIAGNOSIS — J9622 Acute and chronic respiratory failure with hypercapnia: Secondary | ICD-10-CM | POA: Diagnosis present

## 2015-09-30 DIAGNOSIS — Z955 Presence of coronary angioplasty implant and graft: Secondary | ICD-10-CM

## 2015-09-30 DIAGNOSIS — G8929 Other chronic pain: Secondary | ICD-10-CM | POA: Diagnosis present

## 2015-09-30 DIAGNOSIS — N179 Acute kidney failure, unspecified: Secondary | ICD-10-CM | POA: Diagnosis not present

## 2015-09-30 DIAGNOSIS — F329 Major depressive disorder, single episode, unspecified: Secondary | ICD-10-CM | POA: Diagnosis not present

## 2015-09-30 DIAGNOSIS — J9690 Respiratory failure, unspecified, unspecified whether with hypoxia or hypercapnia: Secondary | ICD-10-CM

## 2015-09-30 DIAGNOSIS — K5900 Constipation, unspecified: Secondary | ICD-10-CM | POA: Diagnosis not present

## 2015-09-30 DIAGNOSIS — E785 Hyperlipidemia, unspecified: Secondary | ICD-10-CM | POA: Diagnosis present

## 2015-09-30 DIAGNOSIS — Z8249 Family history of ischemic heart disease and other diseases of the circulatory system: Secondary | ICD-10-CM | POA: Diagnosis not present

## 2015-09-30 DIAGNOSIS — G9341 Metabolic encephalopathy: Secondary | ICD-10-CM | POA: Diagnosis present

## 2015-09-30 DIAGNOSIS — E162 Hypoglycemia, unspecified: Secondary | ICD-10-CM | POA: Diagnosis not present

## 2015-09-30 DIAGNOSIS — I255 Ischemic cardiomyopathy: Secondary | ICD-10-CM | POA: Diagnosis not present

## 2015-09-30 DIAGNOSIS — I481 Persistent atrial fibrillation: Secondary | ICD-10-CM | POA: Diagnosis not present

## 2015-09-30 DIAGNOSIS — Z66 Do not resuscitate: Secondary | ICD-10-CM | POA: Diagnosis present

## 2015-09-30 DIAGNOSIS — B370 Candidal stomatitis: Secondary | ICD-10-CM | POA: Diagnosis present

## 2015-09-30 DIAGNOSIS — I714 Abdominal aortic aneurysm, without rupture: Secondary | ICD-10-CM | POA: Diagnosis present

## 2015-09-30 DIAGNOSIS — I252 Old myocardial infarction: Secondary | ICD-10-CM | POA: Diagnosis not present

## 2015-09-30 DIAGNOSIS — I4891 Unspecified atrial fibrillation: Secondary | ICD-10-CM | POA: Diagnosis present

## 2015-09-30 DIAGNOSIS — Z950 Presence of cardiac pacemaker: Secondary | ICD-10-CM | POA: Diagnosis not present

## 2015-09-30 DIAGNOSIS — I2510 Atherosclerotic heart disease of native coronary artery without angina pectoris: Secondary | ICD-10-CM | POA: Diagnosis present

## 2015-09-30 DIAGNOSIS — J439 Emphysema, unspecified: Secondary | ICD-10-CM | POA: Diagnosis present

## 2015-09-30 DIAGNOSIS — Z87891 Personal history of nicotine dependence: Secondary | ICD-10-CM | POA: Diagnosis not present

## 2015-09-30 DIAGNOSIS — I472 Ventricular tachycardia: Secondary | ICD-10-CM | POA: Diagnosis not present

## 2015-09-30 DIAGNOSIS — N400 Enlarged prostate without lower urinary tract symptoms: Secondary | ICD-10-CM | POA: Diagnosis present

## 2015-09-30 DIAGNOSIS — I482 Chronic atrial fibrillation: Secondary | ICD-10-CM | POA: Diagnosis not present

## 2015-09-30 DIAGNOSIS — I5042 Chronic combined systolic (congestive) and diastolic (congestive) heart failure: Secondary | ICD-10-CM | POA: Diagnosis present

## 2015-09-30 DIAGNOSIS — I4821 Permanent atrial fibrillation: Secondary | ICD-10-CM | POA: Diagnosis present

## 2015-09-30 DIAGNOSIS — Z7952 Long term (current) use of systemic steroids: Secondary | ICD-10-CM | POA: Diagnosis not present

## 2015-09-30 DIAGNOSIS — E119 Type 2 diabetes mellitus without complications: Secondary | ICD-10-CM

## 2015-09-30 DIAGNOSIS — J9621 Acute and chronic respiratory failure with hypoxia: Secondary | ICD-10-CM | POA: Diagnosis present

## 2015-09-30 DIAGNOSIS — J441 Chronic obstructive pulmonary disease with (acute) exacerbation: Secondary | ICD-10-CM | POA: Diagnosis not present

## 2015-09-30 DIAGNOSIS — E440 Moderate protein-calorie malnutrition: Secondary | ICD-10-CM | POA: Diagnosis present

## 2015-09-30 DIAGNOSIS — J189 Pneumonia, unspecified organism: Secondary | ICD-10-CM

## 2015-09-30 DIAGNOSIS — T380X5A Adverse effect of glucocorticoids and synthetic analogues, initial encounter: Secondary | ICD-10-CM | POA: Diagnosis not present

## 2015-09-30 DIAGNOSIS — Z5320 Procedure and treatment not carried out because of patient's decision for unspecified reasons: Secondary | ICD-10-CM | POA: Diagnosis present

## 2015-09-30 DIAGNOSIS — J00 Acute nasopharyngitis [common cold]: Secondary | ICD-10-CM | POA: Diagnosis not present

## 2015-09-30 DIAGNOSIS — R0600 Dyspnea, unspecified: Secondary | ICD-10-CM

## 2015-09-30 DIAGNOSIS — Z9861 Coronary angioplasty status: Secondary | ICD-10-CM

## 2015-09-30 DIAGNOSIS — J80 Acute respiratory distress syndrome: Secondary | ICD-10-CM | POA: Diagnosis not present

## 2015-09-30 LAB — BLOOD GAS, ARTERIAL
Acid-base deficit: 1.4 mmol/L (ref 0.0–2.0)
BICARBONATE: 23.2 meq/L (ref 20.0–24.0)
Delivery systems: POSITIVE
Drawn by: 308601
FIO2: 0.6
Mode: POSITIVE
O2 Saturation: 99.5 %
PATIENT TEMPERATURE: 98.6
PEEP: 5 cmH2O
PO2 ART: 284 mmHg — AB (ref 80.0–100.0)
PRESSURE CONTROL: 10 cmH2O
TCO2: 20.5 mmol/L (ref 0–100)
pCO2 arterial: 40.7 mmHg (ref 35.0–45.0)
pH, Arterial: 7.374 (ref 7.350–7.450)

## 2015-09-30 LAB — CBC WITH DIFFERENTIAL/PLATELET
Basophils Absolute: 0 10*3/uL (ref 0.0–0.1)
Basophils Relative: 0 %
Eosinophils Absolute: 0 10*3/uL (ref 0.0–0.7)
Eosinophils Relative: 0 %
HCT: 46.8 % (ref 39.0–52.0)
HEMOGLOBIN: 14.6 g/dL (ref 13.0–17.0)
LYMPHS ABS: 2.1 10*3/uL (ref 0.7–4.0)
Lymphocytes Relative: 19 %
MCH: 30.5 pg (ref 26.0–34.0)
MCHC: 31.2 g/dL (ref 30.0–36.0)
MCV: 97.9 fL (ref 78.0–100.0)
MONOS PCT: 10 %
Monocytes Absolute: 1.1 10*3/uL — ABNORMAL HIGH (ref 0.1–1.0)
NEUTROS PCT: 71 %
Neutro Abs: 7.6 10*3/uL (ref 1.7–7.7)
Platelets: 148 10*3/uL — ABNORMAL LOW (ref 150–400)
RBC: 4.78 MIL/uL (ref 4.22–5.81)
RDW: 14.7 % (ref 11.5–15.5)
WBC: 10.8 10*3/uL — ABNORMAL HIGH (ref 4.0–10.5)

## 2015-09-30 LAB — BASIC METABOLIC PANEL
Anion gap: 9 (ref 5–15)
BUN: 29 mg/dL — AB (ref 6–20)
CO2: 28 mmol/L (ref 22–32)
CREATININE: 1.1 mg/dL (ref 0.61–1.24)
Calcium: 8.8 mg/dL — ABNORMAL LOW (ref 8.9–10.3)
Chloride: 101 mmol/L (ref 101–111)
GFR calc Af Amer: >60 mL/min (ref >60)
GFR calc non Af Amer: 59 mL/min — ABNORMAL LOW (ref >60)
Glucose, Bld: 124 mg/dL — ABNORMAL HIGH (ref 65–99)
Potassium: 4.1 mmol/L (ref 3.5–5.1)
SODIUM: 138 mmol/L (ref 135–145)

## 2015-09-30 LAB — I-STAT TROPONIN, ED: Troponin i, poc: 0.08 ng/mL (ref 0.00–0.08)

## 2015-09-30 LAB — PROTIME-INR
INR: 1.45 (ref 0.00–1.49)
PROTHROMBIN TIME: 17.7 s — AB (ref 11.6–15.2)

## 2015-09-30 LAB — APTT: aPTT: 29 seconds (ref 24–37)

## 2015-09-30 LAB — INFLUENZA PANEL BY PCR (TYPE A & B)
H1N1 flu by pcr: NOT DETECTED
INFLAPCR: NEGATIVE
INFLBPCR: NEGATIVE

## 2015-09-30 LAB — CBG MONITORING, ED
GLUCOSE-CAPILLARY: 211 mg/dL — AB (ref 65–99)
GLUCOSE-CAPILLARY: 154 mg/dL — AB (ref 65–99)
Glucose-Capillary: 151 mg/dL — ABNORMAL HIGH (ref 65–99)
Glucose-Capillary: 185 mg/dL — ABNORMAL HIGH (ref 65–99)

## 2015-09-30 LAB — BRAIN NATRIURETIC PEPTIDE: B Natriuretic Peptide: 287 pg/mL — ABNORMAL HIGH (ref 0.0–100.0)

## 2015-09-30 LAB — DIGOXIN LEVEL: Digoxin Level: 0.8 ng/mL (ref 0.8–2.0)

## 2015-09-30 MED ORDER — FUROSEMIDE 20 MG PO TABS
20.0000 mg | ORAL_TABLET | Freq: Every day | ORAL | Status: DC
  Administered 2015-09-30 – 2015-10-01 (×2): 20 mg via ORAL
  Filled 2015-09-30 (×2): qty 1

## 2015-09-30 MED ORDER — SODIUM CHLORIDE 0.9 % IJ SOLN
3.0000 mL | Freq: Two times a day (BID) | INTRAMUSCULAR | Status: DC
  Administered 2015-09-30 – 2015-10-16 (×31): 3 mL via INTRAVENOUS

## 2015-09-30 MED ORDER — SENNOSIDES-DOCUSATE SODIUM 8.6-50 MG PO TABS: 2.0000 | ORAL_TABLET | Freq: Every day | ORAL | Status: DC | PRN

## 2015-09-30 MED ORDER — APIXABAN 5 MG PO TABS
5.0000 mg | ORAL_TABLET | Freq: Two times a day (BID) | ORAL | Status: DC
  Administered 2015-09-30 – 2015-10-01 (×4): 5 mg via ORAL
  Filled 2015-09-30 (×5): qty 1

## 2015-09-30 MED ORDER — IPRATROPIUM BROMIDE 0.02 % IN SOLN: 0.5000 mg | RESPIRATORY_TRACT | Status: DC | PRN

## 2015-09-30 MED ORDER — FLUTICASONE PROPIONATE 50 MCG/ACT NA SUSP: 2.0000 | Freq: Every day | NASAL | Status: DC | PRN

## 2015-09-30 MED ORDER — BUDESONIDE 0.5 MG/2ML IN SUSP
0.5000 mg | Freq: Two times a day (BID) | RESPIRATORY_TRACT | Status: DC
  Administered 2015-09-30 – 2015-10-17 (×33): 0.5 mg via RESPIRATORY_TRACT
  Filled 2015-09-30 (×37): qty 2

## 2015-09-30 MED ORDER — ONDANSETRON HCL 4 MG PO TABS: 4.0000 mg | ORAL_TABLET | Freq: Four times a day (QID) | ORAL | Status: DC | PRN

## 2015-09-30 MED ORDER — AZITHROMYCIN 250 MG PO TABS
250.0000 mg | ORAL_TABLET | Freq: Every day | ORAL | Status: DC
  Administered 2015-10-01: 250 mg via ORAL
  Filled 2015-09-30: qty 1

## 2015-09-30 MED ORDER — AZITHROMYCIN 250 MG PO TABS
500.0000 mg | ORAL_TABLET | Freq: Every day | ORAL | Status: AC
Start: 2015-09-30 — End: 2015-09-30
  Administered 2015-09-30: 500 mg via ORAL
  Filled 2015-09-30: qty 2

## 2015-09-30 MED ORDER — TIOTROPIUM BROMIDE MONOHYDRATE 18 MCG IN CAPS
18.0000 ug | ORAL_CAPSULE | Freq: Every day | RESPIRATORY_TRACT | Status: DC
  Administered 2015-09-30 – 2015-10-01 (×2): 18 ug via RESPIRATORY_TRACT
  Filled 2015-09-30 (×2): qty 5

## 2015-09-30 MED ORDER — IPRATROPIUM-ALBUTEROL 0.5-2.5 (3) MG/3ML IN SOLN
3.0000 mL | Freq: Once | RESPIRATORY_TRACT | Status: AC
  Administered 2015-09-30: 3 mL via RESPIRATORY_TRACT
  Filled 2015-09-30: qty 3

## 2015-09-30 MED ORDER — BISOPROLOL FUMARATE 5 MG PO TABS
10.0000 mg | ORAL_TABLET | Freq: Every day | ORAL | Status: DC
  Administered 2015-09-30 – 2015-10-01 (×2): 10 mg via ORAL
  Filled 2015-09-30: qty 2
  Filled 2015-09-30: qty 1

## 2015-09-30 MED ORDER — LEVALBUTEROL HCL 0.63 MG/3ML IN NEBU
0.6300 mg | INHALATION_SOLUTION | Freq: Four times a day (QID) | RESPIRATORY_TRACT | Status: DC
Start: 2015-10-01 — End: 2015-10-02
  Administered 2015-10-01 – 2015-10-02 (×4): 0.63 mg via RESPIRATORY_TRACT
  Filled 2015-09-30 (×5): qty 3

## 2015-09-30 MED ORDER — NITROGLYCERIN 0.4 MG SL SUBL: 0.4000 mg | SUBLINGUAL_TABLET | SUBLINGUAL | Status: DC | PRN

## 2015-09-30 MED ORDER — VITAMIN D 1000 UNITS PO TABS
2000.0000 [IU] | ORAL_TABLET | Freq: Every day | ORAL | Status: DC
  Administered 2015-09-30 – 2015-10-01 (×2): 2000 [IU] via ORAL
  Filled 2015-09-30 (×2): qty 2

## 2015-09-30 MED ORDER — ROSUVASTATIN CALCIUM 20 MG PO TABS
40.0000 mg | ORAL_TABLET | Freq: Every day | ORAL | Status: DC
  Administered 2015-09-30 – 2015-10-01 (×2): 40 mg via ORAL
  Filled 2015-09-30 (×2): qty 2

## 2015-09-30 MED ORDER — TAMSULOSIN HCL 0.4 MG PO CAPS
0.4000 mg | ORAL_CAPSULE | Freq: Two times a day (BID) | ORAL | Status: DC
  Administered 2015-09-30 – 2015-10-01 (×4): 0.4 mg via ORAL
  Filled 2015-09-30 (×4): qty 1

## 2015-09-30 MED ORDER — LEVALBUTEROL HCL 0.63 MG/3ML IN NEBU
0.6300 mg | INHALATION_SOLUTION | RESPIRATORY_TRACT | Status: DC | PRN
  Administered 2015-09-30 – 2015-10-01 (×2): 0.63 mg via RESPIRATORY_TRACT
  Filled 2015-09-30 (×2): qty 3

## 2015-09-30 MED ORDER — METHYLPREDNISOLONE SODIUM SUCC 40 MG IJ SOLR
40.0000 mg | Freq: Four times a day (QID) | INTRAMUSCULAR | Status: DC
  Administered 2015-09-30 – 2015-10-04 (×15): 40 mg via INTRAVENOUS
  Filled 2015-09-30 (×16): qty 1

## 2015-09-30 MED ORDER — GUAIFENESIN ER 600 MG PO TB12
600.0000 mg | ORAL_TABLET | Freq: Every day | ORAL | Status: DC
  Administered 2015-09-30 – 2015-10-01 (×2): 600 mg via ORAL
  Filled 2015-09-30 (×2): qty 1

## 2015-09-30 MED ORDER — LEVALBUTEROL HCL 1.25 MG/0.5ML IN NEBU
0.6300 mg | INHALATION_SOLUTION | Freq: Four times a day (QID) | RESPIRATORY_TRACT | Status: DC
  Administered 2015-09-30: 0.63 mg via RESPIRATORY_TRACT
  Filled 2015-09-30 (×2): qty 0.5

## 2015-09-30 MED ORDER — ACETAMINOPHEN 650 MG RE SUPP: 650.0000 mg | Freq: Four times a day (QID) | RECTAL | Status: DC | PRN

## 2015-09-30 MED ORDER — FINASTERIDE 5 MG PO TABS
5.0000 mg | ORAL_TABLET | Freq: Every day | ORAL | Status: DC
  Administered 2015-09-30 – 2015-10-01 (×2): 5 mg via ORAL
  Filled 2015-09-30 (×2): qty 1

## 2015-09-30 MED ORDER — DILTIAZEM HCL ER 240 MG PO CP24
240.0000 mg | ORAL_CAPSULE | Freq: Every day | ORAL | Status: DC
  Administered 2015-09-30 – 2015-10-01 (×2): 240 mg via ORAL
  Filled 2015-09-30: qty 2
  Filled 2015-09-30 (×3): qty 1

## 2015-09-30 MED ORDER — METHYLPREDNISOLONE SODIUM SUCC 125 MG IJ SOLR
125.0000 mg | Freq: Once | INTRAMUSCULAR | Status: AC
  Administered 2015-09-30: 125 mg via INTRAVENOUS
  Filled 2015-09-30: qty 2

## 2015-09-30 MED ORDER — INSULIN ASPART 100 UNIT/ML ~~LOC~~ SOLN
0.0000 [IU] | Freq: Three times a day (TID) | SUBCUTANEOUS | Status: DC
  Administered 2015-09-30: 2 [IU] via SUBCUTANEOUS
  Administered 2015-09-30: 3 [IU] via SUBCUTANEOUS
  Administered 2015-09-30 – 2015-10-01 (×2): 2 [IU] via SUBCUTANEOUS
  Administered 2015-10-01 (×2): 3 [IU] via SUBCUTANEOUS
  Filled 2015-09-30 (×3): qty 1

## 2015-09-30 MED ORDER — ARFORMOTEROL TARTRATE 15 MCG/2ML IN NEBU
15.0000 ug | INHALATION_SOLUTION | Freq: Two times a day (BID) | RESPIRATORY_TRACT | Status: DC
  Administered 2015-09-30 – 2015-10-02 (×4): 15 ug via RESPIRATORY_TRACT
  Filled 2015-09-30 (×6): qty 2

## 2015-09-30 MED ORDER — ONDANSETRON HCL 4 MG/2ML IJ SOLN
4.0000 mg | Freq: Four times a day (QID) | INTRAMUSCULAR | Status: DC | PRN
  Administered 2015-10-05: 4 mg via INTRAVENOUS
  Filled 2015-09-30: qty 2

## 2015-09-30 MED ORDER — ALPRAZOLAM 0.25 MG PO TABS
0.2500 mg | ORAL_TABLET | Freq: Every evening | ORAL | Status: DC | PRN
  Administered 2015-09-30 – 2015-10-01 (×2): 0.25 mg via ORAL
  Filled 2015-09-30 (×2): qty 1

## 2015-09-30 MED ORDER — LEVALBUTEROL HCL 1.25 MG/0.5ML IN NEBU
1.2500 mg | INHALATION_SOLUTION | Freq: Four times a day (QID) | RESPIRATORY_TRACT | Status: DC
  Administered 2015-09-30: 1.25 mg via RESPIRATORY_TRACT
  Filled 2015-09-30: qty 0.5

## 2015-09-30 MED ORDER — TRAMADOL HCL 50 MG PO TABS
50.0000 mg | ORAL_TABLET | Freq: Three times a day (TID) | ORAL | Status: DC | PRN
  Administered 2015-09-30: 50 mg via ORAL
  Filled 2015-09-30: qty 1

## 2015-09-30 MED ORDER — DIGOXIN 125 MCG PO TABS
0.1250 mg | ORAL_TABLET | Freq: Every day | ORAL | Status: DC
  Administered 2015-09-30 – 2015-10-01 (×2): 0.125 mg via ORAL
  Filled 2015-09-30 (×2): qty 1

## 2015-09-30 MED ORDER — ACETAMINOPHEN 325 MG PO TABS: 650.0000 mg | ORAL_TABLET | Freq: Four times a day (QID) | ORAL | Status: DC | PRN

## 2015-09-30 MED ORDER — METHYLPREDNISOLONE SODIUM SUCC 125 MG IJ SOLR
60.0000 mg | Freq: Three times a day (TID) | INTRAMUSCULAR | Status: DC
  Administered 2015-09-30: 60 mg via INTRAVENOUS
  Filled 2015-09-30: qty 2

## 2015-09-30 NOTE — Progress Notes (Signed)
Pt placed back on BIPAP due to increase in WOB.  Pt tolerating well at this time, RT to monitor and assess as needed.

## 2015-09-30 NOTE — Progress Notes (Signed)
Pt placed on BiPAP for increased WOB.  Pt tolerated for 10 minutes before removing due to "wearing him out."  Tried consoling Pt and calming down by explaining that he had control over every breath but to no avail. Pt states that "the machine is breathing to fast for him.  Pt stable on 4 Lpm [place].  RN aware.

## 2015-09-30 NOTE — ED Provider Notes (Signed)
CSN: [id number]     Arrival date & time 09/30/15  0303 History   First MD Initiated Contact with Patient 09/30/15 [phone]     Chief Complaint  Patient presents with  . Respiratory Distress     (Consider location/radiation/quality/duration/timing/severity/associated sxs/prior Treatment) Patient is a 82 y.o. male presenting with shortness of breath. The history is provided by the patient and the EMS personnel.  Shortness of Breath Severity:  Severe Onset quality:  Sudden Duration:  4 hours Timing:  Constant Progression:  Worsening Chronicity:  New Context comment:  Awoke from sleep with difficulty breathing Relieved by:  Nothing Worsened by:  Nothing tried Ineffective treatments: home breathing treatments. Associated symptoms: cough and wheezing   Associated symptoms: no chest pain and no fever     Past Medical History  Diagnosis Date  . Pneumonia 12/06/10    healthcare-associated/ left lower lobe  . COPD (chronic obstructive pulmonary disease) (HCC)     severe stage IV  . Atrial fibrillation (HCC)     on Coumadin with St. Jude single-chamber pacemaker  . Diabetes mellitus     type 2; s/p Prednisone therapy for pneumonia 12/2010  . Coronary artery disease     s/p anterior STEMI 09/2009; cath. revealed mid LAD 40%, distal LAD 100%- PTCA, prox. RCA 40%, mid. RCA 100% with good collateral filling of PDA; EF 25%; NSTEMI 07/2011 - PCI/DES 100% LCX  . ST elevation (STEMI) myocardial infarction (HCC) 01//11/12    anterior  . CHF (congestive heart failure) (HCC)     EF 25% on 09/26/10; EF 50-55% and grade 1 diastolic dysfunction on ECHO 08/2010   . Hypertension   . Hyperlipidemia   . AAA (abdominal aortic aneurysm) (HCC)     3 cm infrarenal abdominal aortic aneurysm per aorta ultrasound 03/2010  . Osteoarthritis     s/p bilat hip arthroplasty  . Angina   . Ischemic cardiomyopathy     EF 35% LHC 11/12  . GERD (gastroesophageal reflux disease)   . Hypercholesteremia   . PVD  (peripheral vascular disease) (HCC)   . Anxiety    Past Surgical History  Procedure Laterality Date  . Hip arthroplasty      s/p right 09/27/10 and s/p left 09/24/10  . Back surgery    . Knee surgery    . Pacemaker insertion  12/2010    St. Jude single-chamber   . Cardiac catheterization  09/2009, 06/2007    PTCA to distal LAD  . Percutaneous coronary stent intervention (pci-s) N/A 08/13/2011    Procedure: PERCUTANEOUS CORONARY STENT INTERVENTION (PCI-S);  Surgeon: Tonny Bollman, MD;  Location: [facility] CATH LAB;  Service: Cardiovascular;  Laterality: N/A;  . Left heart catheterization with coronary angiogram N/A 08/13/2011    Procedure: LEFT HEART CATHETERIZATION WITH CORONARY ANGIOGRAM;  Surgeon: Tonny Bollman, MD;  Location: [facility] CATH LAB;  Service: Cardiovascular;  Laterality: N/A;   Family History  Problem Relation Age of Onset  . Heart attack Mother 65  . Heart attack Father 77  . Cancer Other     siblings  . Heart attack Other    Social History  Substance Use Topics  . Smoking status: Former Smoker -- 2.00 packs/day for 50 years    Types: Cigarettes    Quit date: 09/02/2009  . Smokeless tobacco: Never Used     Comment: smoked for 93yrs quit for 72yrs started back & smoked for 10ys  . Alcohol Use: Yes     Comment: occ - alcohol  Review of Systems  Constitutional: Negative for fever.  Respiratory: Positive for cough, shortness of breath and wheezing.   Cardiovascular: Negative for chest pain.  All other systems reviewed and are negative.     Allergies  Review of patient's allergies indicates no known allergies.  Home Medications   Prior to Admission medications   Medication Sig Start Date End Date Taking? Authorizing Provider  albuterol (PROVENTIL) (2.5 MG/3ML) 0.083% nebulizer solution Take 2.5 mg by nebulization every 6 (six) hours as needed for wheezing or shortness of breath.    Historical Provider, MD  ALPRAZolam Prudy Feeler) 0.25 MG tablet Take 1 tablet (0.25  mg total) by mouth at bedtime as needed for anxiety. 07/12/14   Kalman Shan, MD  apixaban (ELIQUIS) 5 MG TABS tablet Take 5 mg by mouth 2 (two) times daily.    Historical Provider, MD  beclomethasone (QVAR) 80 MCG/ACT inhaler Inhale 1 puff into the lungs 2 (two) times daily.    Historical Provider, MD  bisoprolol (ZEBETA) 10 MG tablet Take 1 tablet (10 mg total) by mouth daily. 03/19/12 09/21/15  Herby Abraham Edmisten, PA-C  cholecalciferol (VITAMIN D) 1000 UNITS tablet Take 2,000 Units by mouth daily.    Historical Provider, MD  digoxin (LANOXIN) 0.125 MG tablet Take 0.125 mg by mouth daily.    Historical Provider, MD  diltiazem (DILACOR XR) 240 MG 24 hr capsule Take 240 mg by mouth daily.    Historical Provider, MD  finasteride (PROSCAR) 5 MG tablet Take 5 mg by mouth daily.    Historical Provider, MD  fluticasone (FLONASE) 50 MCG/ACT nasal spray Place 2 sprays into both nostrils daily. Patient taking differently: Place 2 sprays into both nostrils daily as needed.  01/16/15   Kalman Shan, MD  furosemide (LASIX) 20 MG tablet Take 20 mg by mouth daily.  06/15/12   Marinus Maw, MD  glimepiride (AMARYL) 2 MG tablet Take 1 tablet (2 mg total) by mouth daily with breakfast. 09/17/13   Martha Clan, MD  guaiFENesin (MUCINEX) 600 MG 12 hr tablet Take 600 mg by mouth at bedtime.     Historical Provider, MD  ipratropium (ATROVENT) 0.02 % nebulizer solution Take 2.5 mLs (0.5 mg total) by nebulization every 6 (six) hours. 09/17/13   Martha Clan, MD  morphine 10 MG/5ML solution Take 2 mLs (4 mg total) by mouth 2 (two) times daily as needed (refractory dyspnea). Patient not taking: Reported on 09/27/2015 01/07/14   Nyoka Cowden, MD  nitroGLYCERIN (NITROSTAT) 0.4 MG SL tablet Place 0.4 mg under the tongue every 5 (five) minutes as needed (MAX 3 TABLETS). For chest pain    Historical Provider, MD  predniSONE (DELTASONE) 5 MG tablet Take 5 mg by mouth daily with breakfast.    Historical Provider, MD  rosuvastatin  (CRESTOR) 40 MG tablet Take 40 mg by mouth daily.    Historical Provider, MD  sennosides-docusate sodium (SENOKOT-S) 8.6-50 MG tablet Take 2 tablets by mouth daily as needed.     Historical Provider, MD  Tamsulosin HCl (FLOMAX) 0.4 MG CAPS Take 0.4 mg by mouth 2 (two) times daily.     Historical Provider, MD  tiotropium (SPIRIVA HANDIHALER) 18 MCG inhalation capsule Place 18 mcg into inhaler and inhale daily.    Historical Provider, MD  traMADol (ULTRAM) 50 MG tablet Take 50 mg by mouth every 8 (eight) hours as needed. for pain 03/08/15   Historical Provider, MD   BP 160/78 mmHg  Pulse 123  Resp 26  SpO2 100%  Physical Exam  Constitutional: He is oriented to person, place, and time. He appears well-developed and well-nourished. He appears toxic. He appears distressed. Face mask in place.  HENT:  Head: Normocephalic and atraumatic.  Eyes: Conjunctivae are normal.  Neck: Neck supple. No tracheal deviation present.  Cardiovascular: Normal rate and normal heart sounds.  An irregularly irregular rhythm present.  Occasional extrasystoles are present.  Pulmonary/Chest: Accessory muscle usage present. Tachypnea noted. He is in respiratory distress. He has decreased breath sounds (bilaterally with minimal air movement). He has no wheezes.  Abdominal: Soft. He exhibits no distension.  Neurological: He is alert and oriented to person, place, and time.  Skin: Skin is warm and dry.  Psychiatric: His mood appears anxious.  Vitals reviewed.   ED Course  Procedures (including critical care time)  CRITICAL CARE Performed by: Lyndal Pulley Total critical care time: 30 minutes Critical care time was exclusive of separately billable procedures and treating other patients. Critical care was necessary to treat or prevent imminent or life-threatening deterioration. Critical care was time spent personally by me on the following activities: development of treatment plan with patient and/or surrogate as well as  nursing, discussions with consultants, evaluation of patient's response to treatment, examination of patient, obtaining history from patient or surrogate, ordering and performing treatments and interventions, ordering and review of laboratory studies, ordering and review of radiographic studies, pulse oximetry and re-evaluation of patient's condition.   Labs Review Labs Reviewed  BASIC METABOLIC PANEL - Abnormal; Notable for the following:    Glucose, Bld 124 (*)    BUN 29 (*)    Calcium 8.8 (*)    GFR calc non Af Amer 59 (*)    All other components within normal limits  BRAIN NATRIURETIC PEPTIDE - Abnormal; Notable for the following:    B Natriuretic Peptide 287.0 (*)    All other components within normal limits  CBC WITH DIFFERENTIAL/PLATELET - Abnormal; Notable for the following:    WBC 10.8 (*)    Platelets 148 (*)    Monocytes Absolute 1.1 (*)    All other components within normal limits  BLOOD GAS, ARTERIAL - Abnormal; Notable for the following:    pO2, Arterial 284 (*)    All other components within normal limits  Rosezena Sensor, ED    Imaging Review Dg Chest Port 1 View  09/30/2015  CLINICAL DATA:  Acute onset of respiratory distress. Initial encounter. EXAM: PORTABLE CHEST 1 VIEW COMPARISON:  Chest radiograph performed 03/29/2015 FINDINGS: The lungs are hyperexpanded, with flattening of the hemidiaphragms, compatible with COPD. There is chronic blunting of the right costophrenic angle. Chronically increased interstitial markings are seen. No definite acute focal airspace consolidation, pleural effusion or pneumothorax is identified. The cardiomediastinal silhouette is mildly enlarged. A pacemaker is noted overlying the left chest wall, with a single lead ending overlying the right ventricle. No acute osseous abnormalities are seen. IMPRESSION: Findings of COPD; mild cardiomegaly.  Lungs otherwise grossly clear. Electronically Signed   By: Roanna Raider M.D.   On: 09/30/2015  04:04   I have personally reviewed and evaluated these images and lab results as part of my medical decision-making.   EKG Interpretation   Date/Time:  Saturday September 30 2015 03:04:37 EST Ventricular Rate:  84 PR Interval:    QRS Duration: 149 QT Interval:  337 QTC Calculation: 398 R Axis:   -80 Text Interpretation:  Atrial fibrillation Nonspecific IVCD with LAD  Premature ventricular complexes LVH with secondary repolarization  abnormality Anterior  infarct, old Baseline wander in lead(s) V6 Confirmed  by Rhodie Cienfuegos MD, Dwayne Begay [phone]([id number]) on 09/30/2015 3:25:37 AM      MDM   Final diagnoses:  COPD, severe (HCC)    [age] y.o. male presents with respiratory distress started tonight. He has history of COPD and took a nebulizer treatment before going to bed but awoke in acute distress. He has diminished air movement and increased work of breathing in fairly significant distress on arrival consistent with COPD exacerbation. Requiring BiPAP to support work of breathing. Planned admission for severe acute exacerbation symptoms. Patient showed signs of improvement on BiPAP with respiratory therapy providing supplementary albuterol. Hospitalist was consulted for stepdown admission and will see the patient in the emergency department.    Lyndal Pulleyaniel Emy Angevine, MD 09/30/15 [phone]

## 2015-09-30 NOTE — H&P (Signed)
[facility] History and Physical  [name] WUJ:[id number] DOB: Jul 12, 1928 DOA: 09/30/2015  Referring physician: ED physician PCP: Martha ClanShaw, William, MD  Specialists:   Chief Complaint: Cough and shortness of breath  HPI: Darin AntonJoe B Wing is a 82 y.o. male with PMH of severe COPD, hypertension, hyperlipidemia, diabetes mellitus, GERD, pacemaker placement, CAD, stent placement, atrial fibrillation on Eliquis, combined systolic and diastolic congestive heart failure with EF of 35-40 percent, AAA, PVD, BPH, who presents with cough and shortness of breath.  Patient reports that since last night he has been having progressively worsening shortness of breath. She also has cough with sputum production. He does not have chest pain, fever or chills. No abdominal pain, diarrhea, symptoms of UTI or unilateral weakness. His pulmonologist is Dr. Marchelle Gearingamaswamy, last seen was on 09/27/15. Per Dr. Jane Canaryamaswamy's note, patient refused Daliresp.   In ED, patient was found to have WBC 10.8, tachycardia, tachypnea, renal function okay, negative troponin. Chest x-ray showed COPD without infiltration. ABG showed a pH 7.374, PCO2 40.7, PO2 284.  EKG: Independently reviewed. QTC 398, LAD, atrial fibrillation, widened QRS wave which is similar to previous EKG on 07/10/15.  Where does patient live?  Assistant living facility  Can patient participate in ADLs? Barely   Review of Systems:   General: no fevers, chills, no changes in body weight, has poor appetite, has fatigue HEENT: no blurry vision, hearing changes or sore throat Pulm: has dyspnea, coughing, wheezing CV: no chest pain, palpitations Abd: no nausea, vomiting, abdominal pain, diarrhea, constipation GU: no dysuria, burning on urination, increased urinary frequency, hematuria  Ext: has mild leg edema Neuro: no unilateral weakness, numbness, or tingling, no vision change or hearing loss Skin: no rash MSK: No muscle spasm, no deformity, no limitation of  range of movement in spin Heme: No easy bruising.  Travel history: No recent long distant travel.  Allergy: No Known Allergies  Past Medical History  Diagnosis Date  . Pneumonia 12/06/10    healthcare-associated/ left lower lobe  . COPD (chronic obstructive pulmonary disease) (HCC)     severe stage IV  . Atrial fibrillation (HCC)     on Coumadin with St. Jude single-chamber pacemaker  . Diabetes mellitus     type 2; s/p Prednisone therapy for pneumonia 12/2010  . Coronary artery disease     s/p anterior STEMI 09/2009; cath. revealed mid LAD 40%, distal LAD 100%- PTCA, prox. RCA 40%, mid. RCA 100% with good collateral filling of PDA; EF 25%; NSTEMI 07/2011 - PCI/DES 100% LCX  . ST elevation (STEMI) myocardial infarction (HCC) 01//11/12    anterior  . CHF (congestive heart failure) (HCC)     EF 25% on 09/26/10; EF 50-55% and grade 1 diastolic dysfunction on ECHO 08/2010   . Hypertension   . Hyperlipidemia   . AAA (abdominal aortic aneurysm) (HCC)     3 cm infrarenal abdominal aortic aneurysm per aorta ultrasound 03/2010  . Osteoarthritis     s/p bilat hip arthroplasty  . Angina   . Ischemic cardiomyopathy     EF 35% LHC 11/12  . GERD (gastroesophageal reflux disease)   . Hypercholesteremia   . PVD (peripheral vascular disease) (HCC)   . Anxiety     Past Surgical History  Procedure Laterality Date  . Hip arthroplasty      s/p right 09/27/10 and s/p left 09/24/10  . Back surgery    . Knee surgery    . Pacemaker insertion  12/2010    St. Jude  single-chamber   . Cardiac catheterization  09/2009, 06/2007    PTCA to distal LAD  . Percutaneous coronary stent intervention (pci-s) N/A 08/13/2011    Procedure: PERCUTANEOUS CORONARY STENT INTERVENTION (PCI-S);  Surgeon: Tonny Bollman, MD;  Location: [facility] CATH LAB;  Service: Cardiovascular;  Laterality: N/A;  . Left heart catheterization with coronary angiogram N/A 08/13/2011    Procedure: LEFT HEART CATHETERIZATION WITH CORONARY  ANGIOGRAM;  Surgeon: Tonny Bollman, MD;  Location: [facility] CATH LAB;  Service: Cardiovascular;  Laterality: N/A;    Social History:  reports that he quit smoking about 6 years ago. His smoking use included Cigarettes. He has a 100 pack-year smoking history. He has never used smokeless tobacco. He reports that he drinks alcohol. He reports that he does not use illicit drugs.  Family History:  Family History  Problem Relation Age of Onset  . Heart attack Mother 28  . Heart attack Father 64  . Cancer Other     siblings  . Heart attack Other      Prior to Admission medications   Medication Sig Start Date End Date Taking? Authorizing Provider  albuterol (PROVENTIL) (2.5 MG/3ML) 0.083% nebulizer solution Take 2.5 mg by nebulization every 6 (six) hours as needed for wheezing or shortness of breath.    Historical Provider, MD  ALPRAZolam Prudy Feeler) 0.25 MG tablet Take 1 tablet (0.25 mg total) by mouth at bedtime as needed for anxiety. 07/12/14   Kalman Shan, MD  apixaban (ELIQUIS) 5 MG TABS tablet Take 5 mg by mouth 2 (two) times daily.    Historical Provider, MD  beclomethasone (QVAR) 80 MCG/ACT inhaler Inhale 1 puff into the lungs 2 (two) times daily.    Historical Provider, MD  bisoprolol (ZEBETA) 10 MG tablet Take 1 tablet (10 mg total) by mouth daily. 03/19/12 09/21/15  Herby Abraham Edmisten, PA-C  cholecalciferol (VITAMIN D) 1000 UNITS tablet Take 2,000 Units by mouth daily.    Historical Provider, MD  digoxin (LANOXIN) 0.125 MG tablet Take 0.125 mg by mouth daily.    Historical Provider, MD  diltiazem (DILACOR XR) 240 MG 24 hr capsule Take 240 mg by mouth daily.    Historical Provider, MD  finasteride (PROSCAR) 5 MG tablet Take 5 mg by mouth daily.    Historical Provider, MD  fluticasone (FLONASE) 50 MCG/ACT nasal spray Place 2 sprays into both nostrils daily. Patient taking differently: Place 2 sprays into both nostrils daily as needed.  01/16/15   Kalman Shan, MD  furosemide (LASIX) 20 MG  tablet Take 20 mg by mouth daily.  06/15/12   Marinus Maw, MD  glimepiride (AMARYL) 2 MG tablet Take 1 tablet (2 mg total) by mouth daily with breakfast. 09/17/13   Martha Clan, MD  guaiFENesin (MUCINEX) 600 MG 12 hr tablet Take 600 mg by mouth at bedtime.     Historical Provider, MD  ipratropium (ATROVENT) 0.02 % nebulizer solution Take 2.5 mLs (0.5 mg total) by nebulization every 6 (six) hours. 09/17/13   Martha Clan, MD  morphine 10 MG/5ML solution Take 2 mLs (4 mg total) by mouth 2 (two) times daily as needed (refractory dyspnea). Patient not taking: Reported on 09/27/2015 01/07/14   Nyoka Cowden, MD  nitroGLYCERIN (NITROSTAT) 0.4 MG SL tablet Place 0.4 mg under the tongue every 5 (five) minutes as needed (MAX 3 TABLETS). For chest pain    Historical Provider, MD  predniSONE (DELTASONE) 5 MG tablet Take 5 mg by mouth daily with breakfast.    Historical Provider,  MD  rosuvastatin (CRESTOR) 40 MG tablet Take 40 mg by mouth daily.    Historical Provider, MD  sennosides-docusate sodium (SENOKOT-S) 8.6-50 MG tablet Take 2 tablets by mouth daily as needed.     Historical Provider, MD  Tamsulosin HCl (FLOMAX) 0.4 MG CAPS Take 0.4 mg by mouth 2 (two) times daily.     Historical Provider, MD  tiotropium (SPIRIVA HANDIHALER) 18 MCG inhalation capsule Place 18 mcg into inhaler and inhale daily.    Historical Provider, MD  traMADol (ULTRAM) 50 MG tablet Take 50 mg by mouth every 8 (eight) hours as needed. for pain 03/08/15   Historical Provider, MD    Physical Exam: Filed Vitals:   09/30/15 0315 09/30/15 0323 09/30/15 0345 09/30/15 0400  BP: 160/78   130/81  Pulse: 123 78  90  Resp: 26 31  26   SpO2:  100% 100% 100%   General: Not in acute distress HEENT:       Eyes: PERRL, EOMI, no scleral icterus.       ENT: No discharge from the ears and nose, no pharynx injection, no tonsillar enlargement.        Neck: No JVD, no bruit, no mass felt. Heme: No neck lymph node enlargement. Cardiac: S1/S2, RRR,  No murmurs, No gallops or rubs. Pulm: Decreased air movement bilaterally. Has wheezing bilaterally. No rales or rubs. Abd: Soft, nondistended, nontender, no rebound pain, no organomegaly, BS present. Ext: Trace leg edema bilaterally. 2+DP/PT pulse bilaterally. Musculoskeletal: No joint deformities, No joint redness or warmth, no limitation of ROM in spin. Skin: No rashes.  Neuro: Alert, oriented X3, cranial nerves II-XII grossly intact, moves all extremities. Psych: Patient is not psychotic, no suicidal or hemocidal ideation.  Labs on Admission:  Basic Metabolic Panel:  Recent Labs Lab 09/30/15 0300  NA 138  K 4.1  CL 101  CO2 28  GLUCOSE 124*  BUN 29*  CREATININE 1.10  CALCIUM 8.8*   Liver Function Tests: No results for input(s): AST, ALT, ALKPHOS, BILITOT, PROT, ALBUMIN in the last 168 hours. No results for input(s): LIPASE, AMYLASE in the last 168 hours. No results for input(s): AMMONIA in the last 168 hours. CBC:  Recent Labs Lab 09/30/15 0300  WBC 10.8*  NEUTROABS 7.6  HGB 14.6  HCT 46.8  MCV 97.9  PLT 148*   Cardiac Enzymes: No results for input(s): CKTOTAL, CKMB, CKMBINDEX, TROPONINI in the last 168 hours.  BNP (last 3 results)  Recent Labs  09/30/15 0300  BNP 287.0*    ProBNP (last 3 results) No results for input(s): PROBNP in the last 8760 hours.  CBG: No results for input(s): GLUCAP in the last 168 hours.  Radiological Exams on Admission: Dg Chest Port 1 View  09/30/2015  CLINICAL DATA:  Acute onset of respiratory distress. Initial encounter. EXAM: PORTABLE CHEST 1 VIEW COMPARISON:  Chest radiograph performed 03/29/2015 FINDINGS: The lungs are hyperexpanded, with flattening of the hemidiaphragms, compatible with COPD. There is chronic blunting of the right costophrenic angle. Chronically increased interstitial markings are seen. No definite acute focal airspace consolidation, pleural effusion or pneumothorax is identified. The cardiomediastinal  silhouette is mildly enlarged. A pacemaker is noted overlying the left chest wall, with a single lead ending overlying the right ventricle. No acute osseous abnormalities are seen. IMPRESSION: Findings of COPD; mild cardiomegaly.  Lungs otherwise grossly clear. Electronically Signed   By: Roanna Raider M.D.   On: 09/30/2015 04:04    Assessment/Plan Principal Problem:   Acute-on-chronic respiratory failure (  HCC) Active Problems:   Abdominal aortic aneurysm (HCC)   Emphysema/COPD (HCC)   Pacemaker-St.Jude   CAD (coronary artery disease)   HLD (hyperlipidemia)   COPD exacerbation (HCC)   Permanent atrial fibrillation (HCC)   Depression   Chronic combined systolic and diastolic congestive heart failure (HCC)   COPD, severe (HCC)   GERD (gastroesophageal reflux disease)   BPH (benign prostatic hyperplasia)   Acute-on-chronic respiratory failure (HCC) and COPD exacerbation: Most likely due to COPD exacerbation given productive cough and wheezing on auscultation. Patient does not have any chest pain, less likely to have pulmonary embolism. Patient has only trace amount of leg edema and no JVD, less likely to have CHF exacerbation.  -will admit patient to SDU -Started BiPAP -Nebulizers: scheduled Duoneb and prn Xopenex -Solu-Medrol 60 mg IV 3 times a day -Oral azithromycin for 5 days.  -Mucinex for cough  -Urine S. pneumococcal antigen -Follow up blood culture x2, sputum culture, Flu pcr  Chronic combined systolic and diastolic congestive heart failure (HCC): 2-D echo on 07/09/13 showed EF 35-40 percent. Patient is on low-dose Lasix, 20 mg daily. He has only trace amount of leg edema, no JVD, less likely to have a exacerbation. -Continue bisoprolol, and Lasix  CAD: No chest pain -Continue bisoprolol and Crestor -When necessary nitroglycerin  HLD: Last LDL was 66 on 03/11/12  -Continue home medications: Crestor  Atrial Fibrillation: CHA2DS2-VASc Score is 6, needs oral  anticoagulation. Patient is on Eliquis at home. INR is  pending on admission. Heart rate is 90-130. -Continue Digoxi, cardizem, bisoprolol and Eliquis -Check the digoxin level  BPH (benign prostatic hyperplasia) -Flomax and a Proscar  DM-II: Last A1c 6.8 on 03/10/12, well controled. Patient is taking Amaryl at home -SSI -A1c  DVT ppx: on Eliquis Code Status: DNR Family Communication:  Yes, patient's daughter at bed side Disposition Plan: Admit to inpatient   Date of Service 09/30/2015    Lorretta Harp [facility] Pager [phone]  If 7PM-7AM, please contact night-coverage [email or web address] Password [email or web address] 09/30/2015, 5:37 AM

## 2015-09-30 NOTE — ED Notes (Signed)
Called family member emergency contact per pt request. Spoke with a Mellissa Kohutmarcia Burns, [phone]( [phone])

## 2015-09-30 NOTE — ED Notes (Signed)
Patient placed in a hospital bed for comfort/holding for ICU bed

## 2015-09-30 NOTE — Progress Notes (Signed)
Pt removed from BIPAP and placed on 4 LPM [place].  Pt tolerating well at this time, no noted distress.  RT to monitor and assess as needed.

## 2015-09-30 NOTE — Consult Note (Signed)
PCCM PROGRESS NOTE  ADMISSION DATE: 09/30/2015 CONSULT DATE: 09/30/2015 REFERRING PROVIDER: Dr. Rito Ehrlich  CC: Short of breath  HISTORY OF PRESENT ILLNESS: 82 yo male former smoker presented with respiratory distress and cough with sputum production.  Concern was for COPD exacerbation.  He was placed on BiPAP and supplemental oxygen.  He was started on solumedrol and antibiotics.  He is DNR.  He was seen recently by Dr. Marchelle Gearing in pulmonary office, and advised to start daliresp >> pt declined this.  He was feeling okay then, but says everyone was coughing in doctors office, and he thinks he picked up a bug.  He started getting some wheeze last night, but felt better after nebulizer treatment and he went to bed.  He woke up suddenly this morning and couldn't catch his breath, so he came to ER.  PAST MEDICAL HISTORY: He  has a past medical history of Pneumonia (12/06/10); COPD (chronic obstructive pulmonary disease) (HCC); Atrial fibrillation (HCC); Diabetes mellitus; Coronary artery disease; ST elevation (STEMI) myocardial infarction (HCC) (01//11/12); CHF (congestive heart failure) (HCC); Hypertension; Hyperlipidemia; AAA (abdominal aortic aneurysm) (HCC); Osteoarthritis; Angina; Ischemic cardiomyopathy; GERD (gastroesophageal reflux disease); Hypercholesteremia; PVD (peripheral vascular disease) (HCC); and Anxiety.  PAST SURGICAL HISTORY: He  has past surgical history that includes Hip Arthroplasty; Back surgery; Knee surgery; Pacemaker insertion (12/2010); Cardiac catheterization (09/2009, 06/2007); percutaneous coronary stent intervention (pci-s) (N/A, 08/13/2011); and left heart catheterization with coronary angiogram (N/A, 08/13/2011).  FAMILY HISTORY: His family history includes Cancer in his other; Heart attack in his other; Heart attack (age of onset: 69) in his mother; Heart attack (age of onset: 10) in his father.  SOCIAL HISTORY: He  reports that he quit smoking about 6 years ago. His  smoking use included Cigarettes. He has a 100 pack-year smoking history. He has never used smokeless tobacco. He reports that he drinks alcohol. He reports that he does not use illicit drugs.  No Known Allergies  No current facility-administered medications on file prior to encounter.   Current Outpatient Prescriptions on File Prior to Encounter  Medication Sig  . albuterol (PROVENTIL) (2.5 MG/3ML) 0.083% nebulizer solution Take 2.5 mg by nebulization every 6 (six) hours as needed for wheezing or shortness of breath.  . ALPRAZolam (XANAX) 0.25 MG tablet Take 1 tablet (0.25 mg total) by mouth at bedtime as needed for anxiety.  Marland Kitchen apixaban (ELIQUIS) 5 MG TABS tablet Take 5 mg by mouth 2 (two) times daily.  . beclomethasone (QVAR) 80 MCG/ACT inhaler Inhale 1 puff into the lungs 2 (two) times daily.  . bisoprolol (ZEBETA) 10 MG tablet Take 1 tablet (10 mg total) by mouth daily.  . cholecalciferol (VITAMIN D) 1000 UNITS tablet Take 2,000 Units by mouth daily.  . digoxin (LANOXIN) 0.125 MG tablet Take 0.125 mg by mouth daily.  Marland Kitchen diltiazem (DILACOR XR) 240 MG 24 hr capsule Take 240 mg by mouth daily.  . finasteride (PROSCAR) 5 MG tablet Take 5 mg by mouth daily.  . fluticasone (FLONASE) 50 MCG/ACT nasal spray Place 2 sprays into both nostrils daily. (Patient taking differently: Place 2 sprays into both nostrils daily as needed. )  . furosemide (LASIX) 20 MG tablet Take 20 mg by mouth daily.   Marland Kitchen glimepiride (AMARYL) 2 MG tablet Take 1 tablet (2 mg total) by mouth daily with breakfast.  . guaiFENesin (MUCINEX) 600 MG 12 hr tablet Take 600 mg by mouth at bedtime.   Marland Kitchen ipratropium (ATROVENT) 0.02 % nebulizer solution Take 2.5 mLs (0.5 mg total)  by nebulization every 6 (six) hours.  Marland Kitchen. morphine 10 MG/5ML solution Take 2 mLs (4 mg total) by mouth 2 (two) times daily as needed (refractory dyspnea). (Patient not taking: Reported on 09/27/2015)  . nitroGLYCERIN (NITROSTAT) 0.4 MG SL tablet Place 0.4 mg under the  tongue every 5 (five) minutes as needed (MAX 3 TABLETS). For chest pain  . predniSONE (DELTASONE) 5 MG tablet Take 5 mg by mouth daily with breakfast.  . rosuvastatin (CRESTOR) 40 MG tablet Take 40 mg by mouth daily.  . sennosides-docusate sodium (SENOKOT-S) 8.6-50 MG tablet Take 2 tablets by mouth daily as needed.   . Tamsulosin HCl (FLOMAX) 0.4 MG CAPS Take 0.4 mg by mouth 2 (two) times daily.   Marland Kitchen. tiotropium (SPIRIVA HANDIHALER) 18 MCG inhalation capsule Place 18 mcg into inhaler and inhale daily.  . traMADol (ULTRAM) 50 MG tablet Take 50 mg by mouth every 8 (eight) hours as needed. for pain    REVIEW OF SYSTEMS: Denies chest pain, sore throat, fever, abdominal pain, diarrhea, skin rash, or leg swelling.  SUBJECTIVE: Breathing still labored, but improved.  VITAL SIGNS: BP 125/89 mmHg  Pulse 89  Resp 20  SpO2 97%  INTAKE/OUTPUT:    General: alert HEENT: no sinus tenderness Cardiac: irregular, no murmur Chest: faint b/l wheeze  Abd: soft, non tender Ext: ankle edema Neuro: normal strength Skin: no rashes   CMP Latest Ref Rng 09/30/2015 03/28/2014 03/27/2014  Glucose 65 - 99 mg/dL 098(J124(H) 191(Y294(H) 782(N150(H)  BUN 6 - 20 mg/dL 56(O29(H) 22 19  Creatinine 0.61 - 1.24 mg/dL 1.301.10 8.650.89 7.841.04  Sodium 135 - 145 mmol/L 138 138 137  Potassium 3.5 - 5.1 mmol/L 4.1 4.0 4.2  Chloride 101 - 111 mmol/L 101 97 98  CO2 22 - 32 mmol/L 28 25 24   Calcium 8.9 - 10.3 mg/dL 6.9(G8.8(L) 8.9 9.1  Total Protein 6.0 - 8.3 g/dL - - -  Total Bilirubin 0.3 - 1.2 mg/dL - - -  Alkaline Phos 39 - 117 U/L - - -  AST 0 - 37 U/L - - -  ALT 0 - 53 U/L - - -     CBC Latest Ref Rng 09/30/2015 03/28/2014 03/27/2014  WBC 4.0 - 10.5 K/uL 10.8(H) 10.8(H) 13.4(H)  Hemoglobin 13.0 - 17.0 g/dL 29.514.6 12.4(L) 13.3  Hematocrit 39.0 - 52.0 % 46.8 38.7(L) 41.9  Platelets 150 - 400 K/uL 148(L) 149(L) 160     Dg Chest Port 1 View  09/30/2015  CLINICAL DATA:  Acute onset of respiratory distress. Initial encounter. EXAM: PORTABLE  CHEST 1 VIEW COMPARISON:  Chest radiograph performed 03/29/2015 FINDINGS: The lungs are hyperexpanded, with flattening of the hemidiaphragms, compatible with COPD. There is chronic blunting of the right costophrenic angle. Chronically increased interstitial markings are seen. No definite acute focal airspace consolidation, pleural effusion or pneumothorax is identified. The cardiomediastinal silhouette is mildly enlarged. A pacemaker is noted overlying the left chest wall, with a single lead ending overlying the right ventricle. No acute osseous abnormalities are seen. IMPRESSION: Findings of COPD; mild cardiomegaly.  Lungs otherwise grossly clear. Electronically Signed   By: Roanna RaiderJeffery  Chang M.D.   On: 09/30/2015 04:04    ABG    Component Value Date/Time   PHART 7.374 09/30/2015 0330   PCO2ART 40.7 09/30/2015 0330   PO2ART 284* 09/30/2015 0330   HCO3 23.2 09/30/2015 0330   TCO2 20.5 09/30/2015 0330   ACIDBASEDEF 1.4 09/30/2015 0330   O2SAT 99.5 09/30/2015 0330     CULTURES: 1/14 Influenza >>  1/14 Pneumococcal Ag >>  ANTIBIOTICS: 1/14 Zithromax >>   STUDIES:  EVENTS: 1/14 Admit  DISCUSSION: 82 yo male former smoker with cough, dyspnea, wheeze, hypoxia from AECOPD.  He has hx of advanced COPD on chronic prednisone, chronic systolic/diastolic CHF with EF 35 to 40% from Oct 2014, Chronic a fib on eliquis, CAD, HTN, HLD, s/p PM, GERD, DM.  He is DNR/DNI.  ASSESSMENT/PLAN:  Acute hypoxic respiratory failure. Plan: - oxygen to keep SpO2 88 to 95% - BiPAP prn  AECOPD. Plan: - spiriva, brovana, pulmicort, xopenex - solumedrol 40 mg IV q6h - Day 1 zithromax  Chronic combined CHF. Hx of A fib, CAD, HTN, HLD. Plan: - per primary team  Goals of Care >> DNR/DNI  PCCM will f/u on 10/02/15 >> call if help needed sooner.  Coralyn Helling, MD [facility] Pulmonary/Critical Care 09/30/2015, 10:59 AM Pager:  [phone] After 3pm call: [phone]

## 2015-09-30 NOTE — Progress Notes (Signed)
[facility] PROGRESS NOTE  [name] ZOX:[id number] DOB: Jul 10, 1928 DOA: 09/30/2015  PCP: Martha Clan, MD  Brief HPI: 82 year old Caucasian male with a past medical history of advanced COPD not on home oxygen, hypertension, hyperlipidemia, diabetes, coronary artery disease, atrial fibrillation on anticoagulation, come mind, systolic and diastolic congestive heart failure, presented with cough and shortness of breath. He was found to have acute COPD exacerbation. He was placed on BiPAP and was admitted to the hospital.  Past medical history:  Past Medical History  Diagnosis Date  . Pneumonia 12/06/10    healthcare-associated/ left lower lobe  . COPD (chronic obstructive pulmonary disease) (HCC)     severe stage IV  . Atrial fibrillation (HCC)     on Coumadin with St. Jude single-chamber pacemaker  . Diabetes mellitus     type 2; s/p Prednisone therapy for pneumonia 12/2010  . Coronary artery disease     s/p anterior STEMI 09/2009; cath. revealed mid LAD 40%, distal LAD 100%- PTCA, prox. RCA 40%, mid. RCA 100% with good collateral filling of PDA; EF 25%; NSTEMI 07/2011 - PCI/DES 100% LCX  . ST elevation (STEMI) myocardial infarction (HCC) 01//11/12    anterior  . CHF (congestive heart failure) (HCC)     EF 25% on 09/26/10; EF 50-55% and grade 1 diastolic dysfunction on ECHO 08/2010   . Hypertension   . Hyperlipidemia   . AAA (abdominal aortic aneurysm) (HCC)     3 cm infrarenal abdominal aortic aneurysm per aorta ultrasound 03/2010  . Osteoarthritis     s/p bilat hip arthroplasty  . Angina   . Ischemic cardiomyopathy     EF 35% LHC 11/12  . GERD (gastroesophageal reflux disease)   . Hypercholesteremia   . PVD (peripheral vascular disease) (HCC)   . Anxiety     Consultants: Pulmonology  Procedures: None  Antibiotics: Azithromycin  Subjective: Patient feels slightly better this morning. He is currently off of his BiPAP. Denies any chest pain, nausea or  vomiting.  Objective: Vital Signs  Filed Vitals:   09/30/15 0945 09/30/15 1006 09/30/15 1108 09/30/15 1221  BP:  125/89 136/86 121/88  Pulse: 85 89 81 55  Resp: 21 20 22 26   SpO2: 100% 97% 100% 100%    Intake/Output Summary (Last 24 hours) at 09/30/15 1319 Last data filed at 09/30/15 0946  Gross per 24 hour  Intake      3 ml  Output      0 ml  Net      3 ml   There were no vitals filed for this visit.  General appearance: alert, cooperative, appears stated age and no distress Resp: Very diminished air entry bilaterally. No obvious wheezing. Rales or rhonchi. Cardio: regular rate and rhythm, S1, S2 normal, no murmur, click, rub or gallop GI: soft, non-tender; bowel sounds normal; no masses,  no organomegaly Extremities: extremities normal, atraumatic, no cyanosis or edema Neurologic: Alert and oriented 3. No focal neurological deficits are noted.  Lab Results:  Basic Metabolic Panel:  Recent Labs Lab 09/30/15 0300  NA 138  K 4.1  CL 101  CO2 28  GLUCOSE 124*  BUN 29*  CREATININE 1.10  CALCIUM 8.8*   CBC:  Recent Labs Lab 09/30/15 0300  WBC 10.8*  NEUTROABS 7.6  HGB 14.6  HCT 46.8  MCV 97.9  PLT 148*   BNP (last 3 results)  Recent Labs  09/30/15 0300  BNP 287.0*    CBG:  Recent Labs Lab 09/30/15 0805  09/30/15 1220  GLUCAP 151* 185*    No results found for this or any previous visit (from the past 240 hour(s)).    Studies/Results: Dg Chest Port 1 View  09/30/2015  CLINICAL DATA:  Acute onset of respiratory distress. Initial encounter. EXAM: PORTABLE CHEST 1 VIEW COMPARISON:  Chest radiograph performed 03/29/2015 FINDINGS: The lungs are hyperexpanded, with flattening of the hemidiaphragms, compatible with COPD. There is chronic blunting of the right costophrenic angle. Chronically increased interstitial markings are seen. No definite acute focal airspace consolidation, pleural effusion or pneumothorax is identified. The cardiomediastinal  silhouette is mildly enlarged. A pacemaker is noted overlying the left chest wall, with a single lead ending overlying the right ventricle. No acute osseous abnormalities are seen. IMPRESSION: Findings of COPD; mild cardiomegaly.  Lungs otherwise grossly clear. Electronically Signed   By: Roanna Raider M.D.   On: 09/30/2015 04:04    Medications:  Scheduled: . apixaban  5 mg Oral BID  . arformoterol  15 mcg Nebulization BID  . [START ON 10/01/2015] azithromycin  250 mg Oral Daily  . bisoprolol  10 mg Oral Daily  . budesonide (PULMICORT) nebulizer solution  0.5 mg Nebulization BID  . cholecalciferol  2,000 Units Oral Daily  . digoxin  0.125 mg Oral Daily  . diltiazem  240 mg Oral Daily  . finasteride  5 mg Oral Daily  . furosemide  20 mg Oral Daily  . guaiFENesin  600 mg Oral QHS  . insulin aspart  0-9 Units Subcutaneous TID WC  . levalbuterol  0.63 mg Nebulization 4 times per day  . methylPREDNISolone (SOLU-MEDROL) injection  40 mg Intravenous Q6H  . rosuvastatin  40 mg Oral Daily  . sodium chloride  3 mL Intravenous Q12H  . tamsulosin  0.4 mg Oral BID  . tiotropium  18 mcg Inhalation Daily   Continuous:  ZOX:[id number] **OR** acetaminophen, ALPRAZolam, fluticasone, levalbuterol, nitroGLYCERIN, ondansetron **OR** ondansetron (ZOFRAN) IV, senna-docusate, traMADol  Assessment/Plan:  Principal Problem:   Acute-on-chronic respiratory failure (HCC) Active Problems:   Abdominal aortic aneurysm (HCC)   Emphysema/COPD (HCC)   Pacemaker-St.Jude   CAD (coronary artery disease)   HLD (hyperlipidemia)   COPD exacerbation (HCC)   Permanent atrial fibrillation (HCC)   Depression   Chronic combined systolic and diastolic congestive heart failure (HCC)   COPD, severe (HCC)   GERD (gastroesophageal reflux disease)   BPH (benign prostatic hyperplasia)    Acute-on-chronic respiratory failure and COPD exacerbation Continue current management for now. Patient is on nebulizer treatments,  steroids and antibiotics. BiPAP as needed. He is closely followed by pulmonology as an outpatient. We will consult during this admission as well.   Chronic combined systolic and diastolic congestive heart failure 2-D echo on 07/09/13 showed EF 35-40 percent. Patient is on low-dose Lasix, 20 mg daily. He has only trace amount of leg edema, no JVD, less likely to have a exacerbation. Continue bisoprolol, and Lasix.  CAD Patient denies any chest pains. Continue medical management.   HLD Continue home medications: Crestor  Atrial Fibrillation CHA2DS2-VASc Score is 6. Patient is on Eliquis at home. Heart rate is reasonably well controlled. Continue Digoxin, cardizem, bisoprolol and Eliquis. Digoxin level is therapeutic  BPH (benign prostatic hyperplasia) Flomax and Proscar  DM-II Continue SSI. HbA1c is pending.  DVT Prophylaxis: Patient is on anticoagulation    Code Status: DO NOT RESUSCITATE  Family Communication: Discussed with the patient. No family at bedside  Disposition Plan: Continue current management. Await pulmonology input.    LOS:  0 days   [facility] IncKRISHNAN,Atlas Kuc  [facility] Pager [phone] 09/30/2015, 1:19 PM  If 7PM-7AM, please contact night-coverage at [email or web address], password [email or web address]

## 2015-09-30 NOTE — ED Notes (Signed)
Per EMS pt felt fine when he went to bed.  Pt woke up in respiratory distress.  Used neb tx prior to going to bed.  Pt given duo neb en route.  A&O x 4.  Pt is from home.

## 2015-10-01 LAB — COMPREHENSIVE METABOLIC PANEL
ALBUMIN: 3.9 g/dL (ref 3.5–5.0)
ALK PHOS: 86 U/L (ref 38–126)
ALT: 30 U/L (ref 17–63)
ANION GAP: 11 (ref 5–15)
AST: 29 U/L (ref 15–41)
BUN: 27 mg/dL — ABNORMAL HIGH (ref 6–20)
CHLORIDE: 102 mmol/L (ref 101–111)
CO2: 26 mmol/L (ref 22–32)
Calcium: 8.8 mg/dL — ABNORMAL LOW (ref 8.9–10.3)
Creatinine, Ser: 1.01 mg/dL (ref 0.61–1.24)
GFR calc Af Amer: >60 mL/min (ref >60)
GFR calc non Af Amer: >60 mL/min (ref >60)
GLUCOSE: 154 mg/dL — AB (ref 65–99)
POTASSIUM: 4.5 mmol/L (ref 3.5–5.1)
SODIUM: 139 mmol/L (ref 135–145)
Total Bilirubin: 1 mg/dL (ref 0.3–1.2)
Total Protein: 6.3 g/dL — ABNORMAL LOW (ref 6.5–8.1)

## 2015-10-01 LAB — CBC
HEMATOCRIT: 45.5 % (ref 39.0–52.0)
HEMOGLOBIN: 14.1 g/dL (ref 13.0–17.0)
MCH: 30.5 pg (ref 26.0–34.0)
MCHC: 31 g/dL (ref 30.0–36.0)
MCV: 98.5 fL (ref 78.0–100.0)
Platelets: 114 10*3/uL — ABNORMAL LOW (ref 150–400)
RBC: 4.62 MIL/uL (ref 4.22–5.81)
RDW: 14.6 % (ref 11.5–15.5)
WBC: 10.3 10*3/uL (ref 4.0–10.5)

## 2015-10-01 LAB — GLUCOSE, CAPILLARY
GLUCOSE-CAPILLARY: 169 mg/dL — AB (ref 65–99)
GLUCOSE-CAPILLARY: 152 mg/dL — AB (ref 65–99)
Glucose-Capillary: 159 mg/dL — ABNORMAL HIGH (ref 65–99)
Glucose-Capillary: 173 mg/dL — ABNORMAL HIGH (ref 65–99)
Glucose-Capillary: 144 mg/dL — ABNORMAL HIGH (ref 65–99)

## 2015-10-01 LAB — MRSA PCR SCREENING: MRSA by PCR: NEGATIVE

## 2015-10-01 LAB — BRAIN NATRIURETIC PEPTIDE: B NATRIURETIC PEPTIDE 5: 737.9 pg/mL — AB (ref 0.0–100.0)

## 2015-10-01 LAB — EXPECTORATED SPUTUM ASSESSMENT W REFEX TO RESP CULTURE

## 2015-10-01 LAB — EXPECTORATED SPUTUM ASSESSMENT W GRAM STAIN, RFLX TO RESP C

## 2015-10-01 MED ORDER — MORPHINE SULFATE (PF) 2 MG/ML IV SOLN
2.0000 mg | INTRAVENOUS | Status: DC | PRN
  Administered 2015-10-01 – 2015-10-02 (×4): 2 mg via INTRAVENOUS
  Filled 2015-10-01 (×4): qty 1

## 2015-10-01 NOTE — Progress Notes (Signed)
Mr [name] was having tachypnea and he said he felt "short of breath".  NRB applied and 2mg  of morphine admin and pt stated he felt better.  Informed Dr. Margarita SermonsKrishnam.  [name], Darin FilbertAaron D, RN

## 2015-10-01 NOTE — Progress Notes (Signed)
Utilization review completed.  

## 2015-10-01 NOTE — Progress Notes (Signed)
[facility] PROGRESS NOTE  [name] [id number]:[id number] DOB: [birth year] DOA: 09/30/2015  PCP: Martha Clan, MD  Brief HPI: 82 year old Caucasian male with a past medical history of advanced COPD not on home oxygen, hypertension, hyperlipidemia, diabetes, coronary artery disease, atrial fibrillation on anticoagulation, come mind, systolic and diastolic congestive heart failure, presented with cough and shortness of breath. He was found to have acute COPD exacerbation. He was placed on BiPAP and was admitted to the hospital.  Past medical history:  Past Medical History  Diagnosis Date  . Pneumonia 12/06/10    healthcare-associated/ left lower lobe  . COPD (chronic obstructive pulmonary disease) (HCC)     severe stage IV  . Atrial fibrillation (HCC)     on Coumadin with St. Jude single-chamber pacemaker  . Diabetes mellitus     type 2; s/p Prednisone therapy for pneumonia 12/2010  . Coronary artery disease     s/p anterior STEMI 09/2009; cath. revealed mid LAD 40%, distal LAD 100%- PTCA, prox. RCA 40%, mid. RCA 100% with good collateral filling of PDA; EF 25%; NSTEMI 07/2011 - PCI/DES 100% LCX  . ST elevation (STEMI) myocardial infarction (HCC) 01//11/12    anterior  . CHF (congestive heart failure) (HCC)     EF 25% on 09/26/10; EF 50-55% and grade 1 diastolic dysfunction on ECHO 08/2010   . Hypertension   . Hyperlipidemia   . AAA (abdominal aortic aneurysm) (HCC)     3 cm infrarenal abdominal aortic aneurysm per aorta ultrasound 03/2010  . Osteoarthritis     s/p bilat hip arthroplasty  . Angina   . Ischemic cardiomyopathy     EF 35% LHC 11/12  . GERD (gastroesophageal reflux disease)   . Hypercholesteremia   . PVD (peripheral vascular disease) (HCC)   . Anxiety     Consultants: Pulmonology  Procedures: None  Antibiotics: Azithromycin  Subjective: Patient declined BiPAP overnight. This morning he was initially feeling quite well. However, after an hour or so,  was called by the nurse stating that the patient was feeling more short of breath. He was given morphine with improvement. He was placed on a nonrebreather.   Objective: Vital Signs  Filed Vitals:   10/01/15 0400 10/01/15 0401 10/01/15 0500 10/01/15 0600  BP: 94/60  113/70 142/77  Pulse: 90  85 85  Temp:  97.9 F (36.6 C)    TempSrc:  Oral    Resp: 20  27 20   Height:      Weight:  82.6 kg (182 lb 1.6 oz)    SpO2: 99%  100% 99%    Intake/Output Summary (Last 24 hours) at 10/01/15 0807 Last data filed at 10/01/15 0503  Gross per 24 hour  Intake      3 ml  Output    220 ml  Net   -217 ml   Filed Weights   09/30/15 2132 10/01/15 0401  Weight: 82.4 kg (181 lb 10.5 oz) 82.6 kg (182 lb 1.6 oz)    General appearance: alert, cooperative, appears stated age and no distress Resp: diminished air entry bilaterally. No obvious wheezing. Rales or rhonchi. Cardio: regular rate and rhythm, S1, S2 normal, no murmur, click, rub or gallop GI: soft, non-tender; bowel sounds normal; no masses,  no organomegaly Extremities: extremities normal, atraumatic, no cyanosis or edema Neurologic: Alert and oriented 3. No focal neurological deficits are noted.  Lab Results:  Basic Metabolic Panel:  Recent Labs Lab 09/30/15 0300 10/01/15 0424  NA 138 139  K  4.1 4.5  CL 101 102  CO2 28 26  GLUCOSE 124* 154*  BUN 29* 27*  CREATININE 1.10 1.01  CALCIUM 8.8* 8.8*   CBC:  Recent Labs Lab 09/30/15 0300 10/01/15 0424  WBC 10.8* 10.3  NEUTROABS 7.6  --   HGB 14.6 14.1  HCT 46.8 45.5  MCV 97.9 98.5  PLT 148* 114*   BNP (last 3 results)  Recent Labs  09/30/15 0300 10/01/15 0424  BNP 287.0* 737.9*    CBG:  Recent Labs Lab 09/30/15 0805 09/30/15 1220 09/30/15 1350 09/30/15 1741 09/30/15 2249  GLUCAP 151* 185* 211* 154* 159*    Recent Results (from the past 240 hour(s))  MRSA PCR Screening     Status: None   Collection Time: 09/30/15  9:40 PM  Result Value Ref Range  Status   MRSA by PCR NEGATIVE NEGATIVE Final    Comment:        The GeneXpert MRSA Assay (FDA approved for NASAL specimens only), is one component of a comprehensive MRSA colonization surveillance program. It is not intended to diagnose MRSA infection nor to guide or monitor treatment for MRSA infections.       Studies/Results: Dg Chest Port 1 View  09/30/2015  CLINICAL DATA:  Acute onset of respiratory distress. Initial encounter. EXAM: PORTABLE CHEST 1 VIEW COMPARISON:  Chest radiograph performed 03/29/2015 FINDINGS: The lungs are hyperexpanded, with flattening of the hemidiaphragms, compatible with COPD. There is chronic blunting of the right costophrenic angle. Chronically increased interstitial markings are seen. No definite acute focal airspace consolidation, pleural effusion or pneumothorax is identified. The cardiomediastinal silhouette is mildly enlarged. A pacemaker is noted overlying the left chest wall, with a single lead ending overlying the right ventricle. No acute osseous abnormalities are seen. IMPRESSION: Findings of COPD; mild cardiomegaly.  Lungs otherwise grossly clear. Electronically Signed   By: Roanna RaiderJeffery  Chang M.D.   On: 09/30/2015 04:04    Medications:  Scheduled: . apixaban  5 mg Oral BID  . arformoterol  15 mcg Nebulization BID  . azithromycin  250 mg Oral Daily  . bisoprolol  10 mg Oral Daily  . budesonide (PULMICORT) nebulizer solution  0.5 mg Nebulization BID  . cholecalciferol  2,000 Units Oral Daily  . digoxin  0.125 mg Oral Daily  . diltiazem  240 mg Oral Daily  . finasteride  5 mg Oral Daily  . furosemide  20 mg Oral Daily  . guaiFENesin  600 mg Oral QHS  . insulin aspart  0-9 Units Subcutaneous TID WC  . levalbuterol  0.63 mg Nebulization Q6H  . methylPREDNISolone (SOLU-MEDROL) injection  40 mg Intravenous Q6H  . rosuvastatin  40 mg Oral Daily  . sodium chloride  3 mL Intravenous Q12H  . tamsulosin  0.4 mg Oral BID  . tiotropium  18 mcg  Inhalation Daily   Continuous:  ZOX:[id number]:acetaminophen **OR** acetaminophen, ALPRAZolam, fluticasone, levalbuterol, nitroGLYCERIN, ondansetron **OR** ondansetron (ZOFRAN) IV, senna-docusate, traMADol  Assessment/Plan:  Principal Problem:   Acute-on-chronic respiratory failure (HCC) Active Problems:   Abdominal aortic aneurysm (HCC)   Emphysema/COPD (HCC)   Pacemaker-St.Jude   CAD (coronary artery disease)   HLD (hyperlipidemia)   COPD exacerbation (HCC)   Permanent atrial fibrillation (HCC)   Depression   Chronic combined systolic and diastolic congestive heart failure (HCC)   COPD, severe (HCC)   GERD (gastroesophageal reflux disease)   BPH (benign prostatic hyperplasia)    Acute-on-chronic respiratory failure and COPD exacerbation Patient's status continues to be tenuous. He has not  been able to tolerate BiPAP. Seen by pulmonology. Agree with morphine for now. Avoid high FiO2 in this patient with advanced COPD. Consider high flow oxygen to nasal cannula. Continue nebulizer treatments, steroids and antibiotics for now.   Chronic combined systolic and diastolic congestive heart failure 2-D echo on 07/09/13 showed EF 35-40 percent. Patient is on low-dose Lasix, 20 mg daily. He has only trace amount of leg edema, no JVD, less likely to have a exacerbation. Continue bisoprolol, and Lasix.  CAD Patient denies any chest pains. Continue medical management.   HLD Continue home medications: Crestor  Atrial Fibrillation CHA2DS2-VASc Score is 6. Patient is on Eliquis at home. Heart rate is reasonably well controlled. Continue Digoxin, cardizem, bisoprolol and Eliquis. Digoxin level is therapeutic  BPH (benign prostatic hyperplasia) Flomax and Proscar  DM-II Continue SSI. HbA1c is pending.  DVT Prophylaxis: Patient is on anticoagulation    Code Status: DO NOT RESUSCITATE  Family Communication: Discussed with the patient. No family at bedside  Disposition Plan: Continue current  management. Does not appear to be ready for transfer to floor.    LOS: 1 day   [facility]  [facility] Pager [phone] 10/01/2015, 8:07 AM  If 7PM-7AM, please contact night-coverage at [email or web address], password [facility]

## 2015-10-01 NOTE — Progress Notes (Signed)
PT Cancellation Note  Patient Details Name: [name] MRN: [id number] DOB: Apr 04, 1928   Cancelled Treatment:    Reason Eval/Treat Not Completed: Patient not medically ready (pt just placed on NRB, incr WOB, discussed with RN will defer today and attempt again as able)   [facility]WILLIAMS,Emaad Nanna 10/01/2015, 12:12 PM

## 2015-10-01 NOTE — Progress Notes (Signed)
Per MD order- RT placed PT on HFNC. RN aware that the lowest liter flow for this device is 6 lpm and highest liter flow for this device is 15 lpm.

## 2015-10-01 NOTE — Progress Notes (Signed)
Initial Nutrition Assessment  DOCUMENTATION CODES:   Non-severe (moderate) malnutrition in context of chronic illness  INTERVENTION:   Encourage PO intake RD to continue to monitor PO intake  NUTRITION DIAGNOSIS:   Inadequate oral intake related to other (see comment) (SOB) as evidenced by meal completion < 25%.  GOAL:   Patient will meet greater than or equal to 90% of their needs  MONITOR:   PO intake, Supplement acceptance, Labs, Weight trends, Skin, I & O's  REASON FOR ASSESSMENT:   Consult COPD Protocol  ASSESSMENT:   82 year old Caucasian male with a past medical history of advanced COPD not on home oxygen, hypertension, hyperlipidemia, diabetes, coronary artery disease, atrial fibrillation on anticoagulation, come mind, systolic and diastolic congestive heart failure, presented with cough and shortness of breath. He was found to have acute COPD exacerbation. He was placed on BiPAP and was admitted to the hospital.  Pt in room with mask on. Pt with SOB today and reports eating very little this past week d/t SOB. Pt states he ate a few bites of peaches and a sip of tea of his lunch tray. He has not eaten anything for breakfast. Pt declines supplements at this time. UBW is 180 lb. Weight has been stable.   Nutrition-Focused physical exam completed. Findings are mild-moderate fat depletion, mild-moderate muscle depletion, and no edema.   Labs reviewed: CBGs: 159-173 Elevated BUN  Diet Order:  Diet Heart Room service appropriate?: Yes; Fluid consistency:: Thin  Skin:  Reviewed, no issues  Last BM:  1/13  Height:   Ht Readings from Last 1 Encounters:  09/30/15 5\' 9"  (1.753 m)    Weight:   Wt Readings from Last 1 Encounters:  10/01/15 182 lb 1.6 oz (82.6 kg)    Ideal Body Weight:  72.7 kg  BMI:  Body mass index is 26.88 kg/(m^2).  Estimated Nutritional Needs:   Kcal:  2300-2500  Protein:  115-125g  Fluid:  2.4L/day  EDUCATION NEEDS:   No  education needs identified at this time  Tilda FrancoLindsey Latandra Loureiro, MS, RD, LDN Pager: [phone] After Hours Pager: [phone]

## 2015-10-02 ENCOUNTER — Inpatient Hospital Stay (HOSPITAL_COMMUNITY): Payer: Medicare Other

## 2015-10-02 DIAGNOSIS — J9621 Acute and chronic respiratory failure with hypoxia: Secondary | ICD-10-CM

## 2015-10-02 DIAGNOSIS — E440 Moderate protein-calorie malnutrition: Secondary | ICD-10-CM | POA: Diagnosis present

## 2015-10-02 DIAGNOSIS — I5042 Chronic combined systolic (congestive) and diastolic (congestive) heart failure: Secondary | ICD-10-CM

## 2015-10-02 DIAGNOSIS — J00 Acute nasopharyngitis [common cold]: Secondary | ICD-10-CM

## 2015-10-02 LAB — GLUCOSE, CAPILLARY
GLUCOSE-CAPILLARY: 177 mg/dL — AB (ref 65–99)
GLUCOSE-CAPILLARY: 182 mg/dL — AB (ref 65–99)
GLUCOSE-CAPILLARY: 273 mg/dL — AB (ref 65–99)
Glucose-Capillary: 155 mg/dL — ABNORMAL HIGH (ref 65–99)

## 2015-10-02 LAB — HEMOGLOBIN A1C
Hgb A1c MFr Bld: 6.7 % — ABNORMAL HIGH (ref 4.8–5.6)
Mean Plasma Glucose: 146 mg/dL

## 2015-10-02 LAB — STREP PNEUMONIAE URINARY ANTIGEN: STREP PNEUMO URINARY ANTIGEN: POSITIVE — AB

## 2015-10-02 MED ORDER — IPRATROPIUM BROMIDE 0.02 % IN SOLN: 0.5000 mg | Freq: Four times a day (QID) | RESPIRATORY_TRACT | Status: DC

## 2015-10-02 MED ORDER — METOPROLOL TARTRATE 1 MG/ML IV SOLN
5.0000 mg | INTRAVENOUS | Status: DC | PRN
  Administered 2015-10-03 – 2015-10-04 (×2): 5 mg via INTRAVENOUS
  Filled 2015-10-02 (×3): qty 5

## 2015-10-02 MED ORDER — INSULIN ASPART 100 UNIT/ML ~~LOC~~ SOLN
0.0000 [IU] | SUBCUTANEOUS | Status: DC
  Administered 2015-10-02: 4 [IU] via SUBCUTANEOUS
  Administered 2015-10-02: 11 [IU] via SUBCUTANEOUS
  Administered 2015-10-02: 4 [IU] via SUBCUTANEOUS
  Administered 2015-10-03: 7 [IU] via SUBCUTANEOUS
  Administered 2015-10-03 (×2): 3 [IU] via SUBCUTANEOUS

## 2015-10-02 MED ORDER — IPRATROPIUM-ALBUTEROL 0.5-2.5 (3) MG/3ML IN SOLN
3.0000 mL | Freq: Four times a day (QID) | RESPIRATORY_TRACT | Status: DC
  Administered 2015-10-02 – 2015-10-04 (×6): 3 mL via RESPIRATORY_TRACT
  Filled 2015-10-02 (×9): qty 3

## 2015-10-02 MED ORDER — CETYLPYRIDINIUM CHLORIDE 0.05 % MT LIQD
7.0000 mL | Freq: Two times a day (BID) | OROMUCOSAL | Status: DC
  Administered 2015-10-02 – 2015-10-15 (×21): 7 mL via OROMUCOSAL

## 2015-10-02 MED ORDER — FUROSEMIDE 10 MG/ML IJ SOLN
20.0000 mg | Freq: Once | INTRAMUSCULAR | Status: AC
  Administered 2015-10-02: 20 mg via INTRAVENOUS
  Filled 2015-10-02: qty 2

## 2015-10-02 MED ORDER — DIGOXIN 0.25 MG/ML IJ SOLN
0.1250 mg | Freq: Every day | INTRAMUSCULAR | Status: DC
  Administered 2015-10-02: 0.125 mg via INTRAVENOUS
  Filled 2015-10-02 (×3): qty 0.5

## 2015-10-02 MED ORDER — LEVALBUTEROL HCL 0.63 MG/3ML IN NEBU
0.6300 mg | INHALATION_SOLUTION | RESPIRATORY_TRACT | Status: DC
  Administered 2015-10-02: 0.63 mg via RESPIRATORY_TRACT

## 2015-10-02 MED ORDER — ALBUTEROL SULFATE (2.5 MG/3ML) 0.083% IN NEBU: 2.5000 mg | INHALATION_SOLUTION | Freq: Four times a day (QID) | RESPIRATORY_TRACT | Status: DC

## 2015-10-02 MED ORDER — DEXTROSE 5 % IV SOLN
1.0000 g | INTRAVENOUS | Status: DC
  Administered 2015-10-02 – 2015-10-09 (×8): 1 g via INTRAVENOUS
  Filled 2015-10-02 (×8): qty 10

## 2015-10-02 MED ORDER — ALBUTEROL SULFATE (2.5 MG/3ML) 0.083% IN NEBU: 2.5000 mg | INHALATION_SOLUTION | RESPIRATORY_TRACT | Status: DC | PRN

## 2015-10-02 MED ORDER — FUROSEMIDE 10 MG/ML IJ SOLN
40.0000 mg | Freq: Once | INTRAMUSCULAR | Status: AC
  Administered 2015-10-02: 40 mg via INTRAVENOUS
  Filled 2015-10-02: qty 4

## 2015-10-02 MED ORDER — CHLORHEXIDINE GLUCONATE 0.12 % MT SOLN
15.0000 mL | Freq: Two times a day (BID) | OROMUCOSAL | Status: DC
  Administered 2015-10-02 – 2015-10-17 (×27): 15 mL via OROMUCOSAL
  Filled 2015-10-02 (×25): qty 15

## 2015-10-02 NOTE — Progress Notes (Addendum)
[facility] PROGRESS NOTE  STACE PEACE ZOX:[id number] DOB: 1928-08-26 DOA: 09/30/2015  PCP: Martha Clan, MD  Brief HPI: 82 year old Caucasian male with a past medical history of advanced COPD not on home oxygen, hypertension, hyperlipidemia, diabetes, coronary artery disease, atrial fibrillation on anticoagulation, come mind, systolic and diastolic congestive heart failure, presented with cough and shortness of breath. He was found to have acute COPD exacerbation. He was placed on BiPAP and was admitted to the hospital.  Past medical history:  Past Medical History  Diagnosis Date  . Pneumonia 12/06/10    healthcare-associated/ left lower lobe  . COPD (chronic obstructive pulmonary disease) (HCC)     severe stage IV  . Atrial fibrillation (HCC)     on Coumadin with [employer]. Jude single-chamber pacemaker  . Diabetes mellitus     type 2; s/p Prednisone therapy for pneumonia 12/2010  . Coronary artery disease     s/p anterior STEMI 09/2009; cath. revealed mid LAD 40%, distal LAD 100%- PTCA, prox. RCA 40%, mid. RCA 100% with good collateral filling of PDA; EF 25%; NSTEMI 07/2011 - PCI/DES 100% LCX  . ST elevation (STEMI) myocardial infarction (HCC) 01//11/12    anterior  . CHF (congestive heart failure) (HCC)     EF 25% on 09/26/10; EF 50-55% and grade 1 diastolic dysfunction on ECHO 08/2010   . Hypertension   . Hyperlipidemia   . AAA (abdominal aortic aneurysm) (HCC)     3 cm infrarenal abdominal aortic aneurysm per aorta ultrasound 03/2010  . Osteoarthritis     s/p bilat hip arthroplasty  . Angina   . Ischemic cardiomyopathy     EF 35% LHC 11/12  . GERD (gastroesophageal reflux disease)   . Hypercholesteremia   . PVD (peripheral vascular disease) (HCC)   . Anxiety     Consultants: Pulmonology  Procedures: None  Antibiotics: Azithromycin  Subjective: Patient not arousable this morning. He had been declining BiPAP this admission. No family at bedside. He is noted  to be on a nonrebreather.   Objective: Vital Signs  Filed Vitals:   10/02/15 0500 10/02/15 0530 10/02/15 0600 10/02/15 0700  BP:      Pulse: 79 94 85 79  Temp:      TempSrc:      Resp: 25 28 29 26   Height:      Weight:      SpO2: 100% 100% 100% 100%    Intake/Output Summary (Last 24 hours) at 10/02/15 0756 Last data filed at 10/01/15 2000  Gross per 24 hour  Intake      0 ml  Output    600 ml  Net   -600 ml   Filed Weights   09/30/15 2132 10/01/15 0401  Weight: 82.4 kg (181 lb 10.5 oz) 82.6 kg (182 lb 1.6 oz)    General appearance: Lethargic. Not arousable. Does open eyes with painful stimuli. Resp: very diminished air entry bilaterally. No obvious wheezing, Rales or rhonchi. Cardio: regular rate and rhythm, S1, S2 normal, no murmur, click, rub or gallop GI: soft, non-tender; bowel sounds normal; no masses,  no organomegaly Extremities: extremities normal, atraumatic, no cyanosis or edema Neurologic: Opens eyes only to painful stimuli.   Lab Results:  Basic Metabolic Panel:  Recent Labs Lab 09/30/15 0300 10/01/15 0424  NA 138 139  K 4.1 4.5  CL 101 102  CO2 28 26  GLUCOSE 124* 154*  BUN 29* 27*  CREATININE 1.10 1.01  CALCIUM 8.8* 8.8*   CBC:  Recent Labs Lab 09/30/15 0300 10/01/15 0424  WBC 10.8* 10.3  NEUTROABS 7.6  --   HGB 14.6 14.1  HCT 46.8 45.5  MCV 97.9 98.5  PLT 148* 114*   BNP (last 3 results)  Recent Labs  09/30/15 0300 10/01/15 0424  BNP 287.0* 737.9*    CBG:  Recent Labs Lab 09/30/15 2249 10/01/15 0844 10/01/15 1205 10/01/15 1705 10/01/15 2129  GLUCAP 159* 173* 169* 152* 144*    Recent Results (from the past 240 hour(s))  MRSA PCR Screening     Status: None   Collection Time: 09/30/15  9:40 PM  Result Value Ref Range Status   MRSA by PCR NEGATIVE NEGATIVE Final    Comment:        The GeneXpert MRSA Assay (FDA approved for NASAL specimens only), is one component of a comprehensive MRSA  colonization surveillance program. It is not intended to diagnose MRSA infection nor to guide or monitor treatment for MRSA infections.   Culture, expectorated sputum-assessment     Status: None   Collection Time: 10/01/15  1:32 PM  Result Value Ref Range Status   Specimen Description SPUTUM  Final   Special Requests NONE  Final   Sputum evaluation   Final    THIS SPECIMEN IS ACCEPTABLE. RESPIRATORY CULTURE REPORT TO FOLLOW.   Report Status 10/01/2015 FINAL  Final      Studies/Results: Dg Chest Port 1 View  10/02/2015  CLINICAL DATA:  82 year old male with shortness of breath EXAM: PORTABLE CHEST 1 VIEW COMPARISON:  Radiograph dated 09/30/2015 FINDINGS: Single-view of the chest demonstrates emphysematous changes of the lungs. Mild interstitial prominence may represent a degree of congestion. No focal consolidation. There is blunting of the right costophrenic angle likely a small pleural effusion. No pneumothorax. Stable cardiomegaly. The osseous structures appear unremarkable. IMPRESSION: Cardiomegaly with possible mild congestion.  No focal consolidation. Emphysema.  No pneumothorax. Small right pleural effusion. Electronically Signed   By: Elgie Collard M.D.   On: 10/02/2015 05:36    Medications:  Scheduled: . apixaban  5 mg Oral BID  . arformoterol  15 mcg Nebulization BID  . azithromycin  250 mg Oral Daily  . bisoprolol  10 mg Oral Daily  . budesonide (PULMICORT) nebulizer solution  0.5 mg Nebulization BID  . cholecalciferol  2,000 Units Oral Daily  . digoxin  0.125 mg Oral Daily  . diltiazem  240 mg Oral Daily  . finasteride  5 mg Oral Daily  . furosemide  20 mg Oral Daily  . guaiFENesin  600 mg Oral QHS  . insulin aspart  0-9 Units Subcutaneous TID WC  . levalbuterol  0.63 mg Nebulization Q6H  . methylPREDNISolone (SOLU-MEDROL) injection  40 mg Intravenous Q6H  . rosuvastatin  40 mg Oral Daily  . sodium chloride  3 mL Intravenous Q12H  . tamsulosin  0.4 mg Oral BID   . tiotropium  18 mcg Inhalation Daily   Continuous:  ZOX:[id number] **OR** acetaminophen, ALPRAZolam, fluticasone, levalbuterol, morphine injection, nitroGLYCERIN, ondansetron **OR** ondansetron (ZOFRAN) IV, senna-docusate, traMADol  Assessment/Plan:  Principal Problem:   Acute-on-chronic respiratory failure (HCC) Active Problems:   Abdominal aortic aneurysm (HCC)   Emphysema/COPD (HCC)   Pacemaker-St.Jude   CAD (coronary artery disease)   HLD (hyperlipidemia)   COPD exacerbation (HCC)   Permanent atrial fibrillation (HCC)   Depression   Chronic combined systolic and diastolic congestive heart failure (HCC)   COPD, severe (HCC)   GERD (gastroesophageal reflux disease)   BPH (benign prostatic hyperplasia)  Acute-on-chronic respiratory failure and COPD exacerbation Patient's mental status noted to be worse this morning. He is not responding appropriately as he was yesterday. It's quite likely that he is profoundly hypercapnic. Could also be due to Morphine. He is a DO NOT RESUSCITATE. It is reasonable to attempt BiPAP to see if his mental status improves. This has been reinitiated. Discussed with Dr. Craige CottaSood with critical care medicine. He agrees. If patient's mental status does not improve, then there would be nothing more for us to offer this patient and his prognosis will become extremely poor and he will likely pass away. His long term prognosis in any case, remains very poor. Continue nebulizer treatments, steroids and antibiotics for now.   Chronic combined systolic and diastolic congestive heart failure No evidence for acute exacerbation. 2-D echo on 07/09/13 showed EF 35-40 percent. Patient is on low-dose Lasix, 20 mg daily. Continue bisoprolol, and Lasix.  CAD Patient denies any chest pains. Continue medical management.   HLD Continue home medications: Crestor  Atrial Fibrillation CHA2DS2-VASc Score is 6. Patient is on Eliquis at home. Heart rate is reasonably well  controlled. Continue Digoxin, cardizem, bisoprolol and Eliquis. Digoxin level is therapeutic  BPH (benign prostatic hyperplasia) Flomax and Proscar  DM-II Continue SSI. HbA1c is 6.7.  DVT Prophylaxis: Patient is on anticoagulation    Code Status: DO NOT RESUSCITATE  Family Communication: Patient's daughter has been notified. She is not here yet. Disposition Plan: Initiated BiPAP. Prognosis is very poor.    LOS: 2 days   [facility] HospitalKRISHNAN,Swanson Farnell  [facility] Pager [phone] 10/02/2015, 7:56 AM  If 7PM-7AM, please contact night-coverage at [email or web address], password [email or web address]

## 2015-10-02 NOTE — Progress Notes (Signed)
Midlevel notified of pt increased wob,  despite changing from 6liter hfnc to nrb and morphine 2mg . Informed of increase of adventitious breath sounds to lt lobes. New orders received.

## 2015-10-02 NOTE — Progress Notes (Signed)
PCCM PROGRESS NOTE  ADMISSION DATE: 09/30/2015 CONSULT DATE: 09/30/2015 REFERRING PROVIDER: Dr. Rito EhrlichKrishnan  CC: Short of breath  SUBJECTIVE: Mental status worse overnight.  Stuck on BiPAP.  Given morphine this AM to alleviate perceived dyspnea.  VITAL SIGNS: BP 163/55 mmHg  Pulse 97  Temp(Src) 97.9 F (36.6 C) (Axillary)  Resp 26  Ht 5\' 9"  (1.753 m)  Wt 182 lb 1.6 oz (82.6 kg)  BMI 26.88 kg/m2  SpO2 92%  INTAKE/OUTPUT: I/O last 3 completed shifts: In: -  Out: 820 [Urine:820]  General: somnolent HEENT: BiPAP mask on Cardiac: irregular, no murmur Chest: poor air movement, abdominal breathing Abd: soft, non tender Ext: ankle edema Neuro: briefly opens eyes with stimulation Skin: no rashes   CMP Latest Ref Rng 10/01/2015 09/30/2015 03/28/2014  Glucose 65 - 99 mg/dL 161(W154(H) 960(A124(H) 540(J294(H)  BUN 6 - 20 mg/dL 81(X27(H) 91(Y29(H) 22  Creatinine 0.61 - 1.24 mg/dL 7.821.01 9.561.10 2.130.89  Sodium 135 - 145 mmol/L 139 138 138  Potassium 3.5 - 5.1 mmol/L 4.5 4.1 4.0  Chloride 101 - 111 mmol/L 102 101 97  CO2 22 - 32 mmol/L 26 28 25   Calcium 8.9 - 10.3 mg/dL 0.8(M8.8(L) 5.7(Q8.8(L) 8.9  Total Protein 6.5 - 8.1 g/dL 6.3(L) - -  Total Bilirubin 0.3 - 1.2 mg/dL 1.0 - -  Alkaline Phos 38 - 126 U/L 86 - -  AST 15 - 41 U/L 29 - -  ALT 17 - 63 U/L 30 - -     CBC Latest Ref Rng 10/01/2015 09/30/2015 03/28/2014  WBC 4.0 - 10.5 K/uL 10.3 10.8(H) 10.8(H)  Hemoglobin 13.0 - 17.0 g/dL 46.914.1 62.914.6 12.4(L)  Hematocrit 39.0 - 52.0 % 45.5 46.8 38.7(L)  Platelets 150 - 400 K/uL 114(L) 148(L) 149(L)     Dg Chest Port 1 View  10/02/2015  CLINICAL DATA:  82 year old male with shortness of breath EXAM: PORTABLE CHEST 1 VIEW COMPARISON:  Radiograph dated 09/30/2015 FINDINGS: Single-view of the chest demonstrates emphysematous changes of the lungs. Mild interstitial prominence may represent a degree of congestion. No focal consolidation. There is blunting of the right costophrenic angle likely a small pleural effusion. No  pneumothorax. Stable cardiomegaly. The osseous structures appear unremarkable. IMPRESSION: Cardiomegaly with possible mild congestion.  No focal consolidation. Emphysema.  No pneumothorax. Small right pleural effusion. Electronically Signed   By: Elgie CollardArash  Radparvar M.D.   On: 10/02/2015 05:36    CBG (last 3)   Recent Labs  10/01/15 1705 10/01/15 2129 10/02/15 0737  GLUCAP 152* 144* 155*     ABG    Component Value Date/Time   PHART 7.374 09/30/2015 0330   PCO2ART 40.7 09/30/2015 0330   PO2ART 284* 09/30/2015 0330   HCO3 23.2 09/30/2015 0330   TCO2 20.5 09/30/2015 0330   ACIDBASEDEF 1.4 09/30/2015 0330   O2SAT 99.5 09/30/2015 0330     CULTURES: 1/14 Influenza >> negative 1/14 Pneumococcal Ag >> Positive  ANTIBIOTICS: 1/14 Zithromax >> 1/16 1/16 Rocephin >>  STUDIES:  EVENTS: 1/14 Admit 1/16 Obtunded, stuck on BiPAP  DISCUSSION: 82 yo male former smoker with cough, dyspnea, wheeze, hypoxia from AECOPD.  He has hx of advanced COPD on chronic prednisone, chronic systolic/diastolic CHF with EF 35 to 40% from Oct 2014, Chronic a fib on eliquis, CAD, HTN, HLD, s/p PM, GERD, DM.  He is DNR/DNI.  ASSESSMENT/PLAN:  Acute hypoxic/hypercapnic respiratory failure. Plan: - oxygen to keep SpO2 88 to 95% - Continue BiPAP  AECOPD. Pneumococcal tracheobronchitis. Plan: - pulmicort, albuterol, ipratropium - continue solumedrol  40 mg IV q6h - Day 3 of Abx, change to rocephin  Chronic combined CHF. Hx of A fib, CAD, HTN, HLD. Plan: - lasix 40 mg IV x one 1/16 - hold eliquis, zebeta, diltiazem, crestor po - prn IV lopressor for HR > 125 or SBP > 160 - change digoxin to IV  Acute encephalopathy likely 2nd to hypercapnia. Plan: - if mental status does not improve, then concerned he might not be able to recove  Goals of Care >> DNR/DNI.  Updated pt's family at bedside.  Will need to re-assess mental status after morphine has time to metabolize >> if mental status no  better, then I am concerned this indicates progressive respiratory failure with hypercapnia.  CC time 32 minutes.  Coralyn Helling, MD [facility]/Critical Care 10/02/2015, 9:11 AM Pager:  [phone] After 3pm call: [phone]

## 2015-10-02 NOTE — Progress Notes (Signed)
PT Cancellation Note  Patient Details Name: Darin AntonJoe B [name] MRN: [id number] DOB: 22-Jan-1928   Cancelled Treatment:    Reason Eval/Treat Not Completed: Other (comment) (PT order has been discontinued by MD.)   [name], [name] 10/02/2015, 1:10 PM Blanchard KelchKaren Vicki Chaffin PT [phone]

## 2015-10-02 NOTE — Progress Notes (Signed)
PT Cancellation Note  Patient Details Name: [name] MRN: [id number] DOB: 31-Jan-1928   Cancelled Treatment:    Reason Eval/Treat Not Completed: Patient not medically ready (requiring BiPAP currently. check back later as schedule allows.)   [name], Lakechia Nay Elizabeth 10/02/2015, 8:48 AM Blanchard KelchKaren Skyeler Scalese PT [phone]

## 2015-10-03 DIAGNOSIS — J9622 Acute and chronic respiratory failure with hypercapnia: Secondary | ICD-10-CM

## 2015-10-03 LAB — CULTURE, RESPIRATORY W GRAM STAIN: Culture: NORMAL

## 2015-10-03 LAB — GLUCOSE, CAPILLARY
GLUCOSE-CAPILLARY: 202 mg/dL — AB (ref 65–99)
GLUCOSE-CAPILLARY: 208 mg/dL — AB (ref 65–99)
GLUCOSE-CAPILLARY: 294 mg/dL — AB (ref 65–99)
Glucose-Capillary: 145 mg/dL — ABNORMAL HIGH (ref 65–99)
Glucose-Capillary: 126 mg/dL — ABNORMAL HIGH (ref 65–99)
Glucose-Capillary: 104 mg/dL — ABNORMAL HIGH (ref 65–99)

## 2015-10-03 LAB — CBC
HCT: 50.4 % (ref 39.0–52.0)
HEMOGLOBIN: 15.2 g/dL (ref 13.0–17.0)
MCH: 30.1 pg (ref 26.0–34.0)
MCHC: 30.2 g/dL (ref 30.0–36.0)
MCV: 99.8 fL (ref 78.0–100.0)
Platelets: 162 10*3/uL (ref 150–400)
RBC: 5.05 MIL/uL (ref 4.22–5.81)
RDW: 14.8 % (ref 11.5–15.5)
WBC: 18.2 10*3/uL — AB (ref 4.0–10.5)

## 2015-10-03 LAB — BASIC METABOLIC PANEL
Anion gap: 13 (ref 5–15)
BUN: 53 mg/dL — ABNORMAL HIGH (ref 6–20)
CALCIUM: 9.1 mg/dL (ref 8.9–10.3)
CO2: 30 mmol/L (ref 22–32)
CREATININE: 1.45 mg/dL — AB (ref 0.61–1.24)
Chloride: 99 mmol/L — ABNORMAL LOW (ref 101–111)
GFR calc non Af Amer: 42 mL/min — ABNORMAL LOW (ref >60)
GFR, EST AFRICAN AMERICAN: 49 mL/min — AB (ref >60)
Glucose, Bld: 169 mg/dL — ABNORMAL HIGH (ref 65–99)
Potassium: 4.3 mmol/L (ref 3.5–5.1)
SODIUM: 142 mmol/L (ref 135–145)

## 2015-10-03 LAB — CULTURE, RESPIRATORY

## 2015-10-03 MED ORDER — DIGOXIN 125 MCG PO TABS
0.1250 mg | ORAL_TABLET | Freq: Every day | ORAL | Status: DC
Start: 2015-10-03 — End: 2015-10-17
  Administered 2015-10-03 – 2015-10-17 (×15): 0.125 mg via ORAL
  Filled 2015-10-03 (×15): qty 1

## 2015-10-03 MED ORDER — INSULIN ASPART 100 UNIT/ML ~~LOC~~ SOLN
0.0000 [IU] | Freq: Three times a day (TID) | SUBCUTANEOUS | Status: DC
  Administered 2015-10-03: 7 [IU] via SUBCUTANEOUS
  Administered 2015-10-04: 15 [IU] via SUBCUTANEOUS
  Administered 2015-10-04: 11 [IU] via SUBCUTANEOUS
  Administered 2015-10-04: 7 [IU] via SUBCUTANEOUS
  Administered 2015-10-05: 11 [IU] via SUBCUTANEOUS
  Administered 2015-10-05: 7 [IU] via SUBCUTANEOUS
  Administered 2015-10-05 – 2015-10-06 (×2): 11 [IU] via SUBCUTANEOUS
  Administered 2015-10-06: 4 [IU] via SUBCUTANEOUS
  Administered 2015-10-06: 15 [IU] via SUBCUTANEOUS
  Administered 2015-10-07: 11 [IU] via SUBCUTANEOUS
  Administered 2015-10-07: 7 [IU] via SUBCUTANEOUS
  Administered 2015-10-07 – 2015-10-08 (×2): 11 [IU] via SUBCUTANEOUS
  Administered 2015-10-08: 4 [IU] via SUBCUTANEOUS
  Administered 2015-10-08 – 2015-10-09 (×2): 11 [IU] via SUBCUTANEOUS
  Administered 2015-10-09: 7 [IU] via SUBCUTANEOUS
  Administered 2015-10-10: 4 [IU] via SUBCUTANEOUS
  Administered 2015-10-10 (×2): 7 [IU] via SUBCUTANEOUS
  Administered 2015-10-11: 3 [IU] via SUBCUTANEOUS
  Administered 2015-10-11: 7 [IU] via SUBCUTANEOUS
  Administered 2015-10-12 – 2015-10-13 (×2): 3 [IU] via SUBCUTANEOUS
  Administered 2015-10-14: 4 [IU] via SUBCUTANEOUS
  Administered 2015-10-15: 3 [IU] via SUBCUTANEOUS
  Administered 2015-10-16: 7 [IU] via SUBCUTANEOUS
  Administered 2015-10-17: 4 [IU] via SUBCUTANEOUS
  Administered 2015-10-17: 3 [IU] via SUBCUTANEOUS

## 2015-10-03 MED ORDER — FINASTERIDE 5 MG PO TABS
5.0000 mg | ORAL_TABLET | Freq: Every day | ORAL | Status: DC
  Administered 2015-10-03 – 2015-10-17 (×15): 5 mg via ORAL
  Filled 2015-10-03 (×15): qty 1

## 2015-10-03 MED ORDER — TAMSULOSIN HCL 0.4 MG PO CAPS
0.4000 mg | ORAL_CAPSULE | Freq: Two times a day (BID) | ORAL | Status: DC
  Administered 2015-10-03 – 2015-10-17 (×28): 0.4 mg via ORAL
  Filled 2015-10-03 (×28): qty 1

## 2015-10-03 MED ORDER — INSULIN ASPART 100 UNIT/ML ~~LOC~~ SOLN
0.0000 [IU] | Freq: Every day | SUBCUTANEOUS | Status: DC
  Administered 2015-10-03: 3 [IU] via SUBCUTANEOUS
  Administered 2015-10-05: 2 [IU] via SUBCUTANEOUS
  Administered 2015-10-06: 3 [IU] via SUBCUTANEOUS
  Administered 2015-10-07: 4 [IU] via SUBCUTANEOUS
  Administered 2015-10-10: 3 [IU] via SUBCUTANEOUS
  Administered 2015-10-16: 2 [IU] via SUBCUTANEOUS

## 2015-10-03 MED ORDER — BISOPROLOL FUMARATE 5 MG PO TABS
10.0000 mg | ORAL_TABLET | Freq: Every day | ORAL | Status: DC
  Administered 2015-10-03 – 2015-10-10 (×8): 10 mg via ORAL
  Filled 2015-10-03 (×8): qty 2

## 2015-10-03 MED ORDER — DILTIAZEM HCL ER 120 MG PO CP24
240.0000 mg | ORAL_CAPSULE | Freq: Every day | ORAL | Status: DC
  Administered 2015-10-03 – 2015-10-04 (×2): 240 mg via ORAL
  Filled 2015-10-03 (×4): qty 2

## 2015-10-03 MED ORDER — DILTIAZEM HCL ER 240 MG PO CP24
240.0000 mg | ORAL_CAPSULE | Freq: Every day | ORAL | Status: DC
  Filled 2015-10-03: qty 2
  Filled 2015-10-03: qty 1

## 2015-10-03 MED ORDER — APIXABAN 5 MG PO TABS
5.0000 mg | ORAL_TABLET | Freq: Two times a day (BID) | ORAL | Status: DC
  Administered 2015-10-03 – 2015-10-17 (×28): 5 mg via ORAL
  Filled 2015-10-03 (×28): qty 1

## 2015-10-03 MED ORDER — MORPHINE SULFATE (PF) 2 MG/ML IV SOLN
1.0000 mg | INTRAVENOUS | Status: DC | PRN
  Administered 2015-10-03: 2 mg via INTRAVENOUS
  Administered 2015-10-03: 1 mg via INTRAVENOUS
  Filled 2015-10-03 (×2): qty 1

## 2015-10-03 MED ORDER — MORPHINE SULFATE (PF) 2 MG/ML IV SOLN
INTRAVENOUS | Status: AC
  Filled 2015-10-03: qty 1

## 2015-10-03 MED ORDER — FUROSEMIDE 10 MG/ML IJ SOLN
40.0000 mg | Freq: Once | INTRAMUSCULAR | Status: AC
  Administered 2015-10-03: 40 mg via INTRAVENOUS

## 2015-10-03 MED ORDER — FUROSEMIDE 10 MG/ML IJ SOLN
INTRAMUSCULAR | Status: AC
  Filled 2015-10-03: qty 4

## 2015-10-03 MED ORDER — ROSUVASTATIN CALCIUM 20 MG PO TABS
40.0000 mg | ORAL_TABLET | Freq: Every day | ORAL | Status: DC
  Administered 2015-10-04 – 2015-10-16 (×13): 40 mg via ORAL
  Filled 2015-10-03 (×13): qty 2

## 2015-10-03 NOTE — Progress Notes (Signed)
[facility] PROGRESS NOTE  MARQUAVIS HANNEN YQM:[id number] DOB: April 30, 1928 DOA: 09/30/2015  PCP: Martha Clan, MD  Brief HPI: 82 year old Caucasian male with a past medical history of advanced COPD not on home oxygen, hypertension, hyperlipidemia, diabetes, coronary artery disease, atrial fibrillation on anticoagulation, come mind, systolic and diastolic congestive heart failure, presented with cough and shortness of breath. He was found to have acute COPD exacerbation. He was placed on BiPAP and was admitted to the hospital.  Past medical history:  Past Medical History  Diagnosis Date  . Pneumonia 12/06/10    healthcare-associated/ left lower lobe  . COPD (chronic obstructive pulmonary disease) (HCC)     severe stage IV  . Atrial fibrillation (HCC)     on Coumadin with [employer] Jude single-chamber pacemaker  . Diabetes mellitus     type 2; s/p Prednisone therapy for pneumonia 12/2010  . Coronary artery disease     s/p anterior STEMI 09/2009; cath. revealed mid LAD 40%, distal LAD 100%- PTCA, prox. RCA 40%, mid. RCA 100% with good collateral filling of PDA; EF 25%; NSTEMI 07/2011 - PCI/DES 100% LCX  . ST elevation (STEMI) myocardial infarction (HCC) 01//11/12    anterior  . CHF (congestive heart failure) (HCC)     EF 25% on 09/26/10; EF 50-55% and grade 1 diastolic dysfunction on ECHO 08/2010   . Hypertension   . Hyperlipidemia   . AAA (abdominal aortic aneurysm) (HCC)     3 cm infrarenal abdominal aortic aneurysm per aorta ultrasound 03/2010  . Osteoarthritis     s/p bilat hip arthroplasty  . Angina   . Ischemic cardiomyopathy     EF 35% LHC 11/12  . GERD (gastroesophageal reflux disease)   . Hypercholesteremia   . PVD (peripheral vascular disease) (HCC)   . Anxiety     Consultants: Pulmonology  Procedures: None  Antibiotics: Azithromycin changed to ceftriaxone on 1/17  Subjective: Patient awake and alert this morning. Seems a bit confused. He states that he  continues to have difficulty breathing. Some chest pain as well. No nausea or vomiting.   Objective: Vital Signs  Filed Vitals:   10/03/15 0425 10/03/15 0500 10/03/15 0600 10/03/15 0754  BP:   151/106   Pulse:  56  79  Temp:      TempSrc:      Resp:  16  27  Height:      Weight: 80.7 kg (177 lb 14.6 oz)     SpO2:  96%  98%    Intake/Output Summary (Last 24 hours) at 10/03/15 0759 Last data filed at 10/03/15 0000  Gross per 24 hour  Intake     50 ml  Output    625 ml  Net   -575 ml   Filed Weights   09/30/15 2132 10/01/15 0401 10/03/15 0425  Weight: 82.4 kg (181 lb 10.5 oz) 82.6 kg (182 lb 1.6 oz) 80.7 kg (177 lb 14.6 oz)    General appearance: Awake today. Tachypneic. Tachycardic.  Resp: Continues to have diminished air entry bilaterally. No obvious wheezing, Rales or rhonchi. Cardio: regular rate and rhythm, S1, S2 normal, no murmur, click, rub or gallop GI: soft, non-tender; bowel sounds normal; no masses,  no organomegaly Extremities: extremities normal, atraumatic, no cyanosis or edema Neurologic: Awake and alert. Moving all his extremities.   Lab Results:  Basic Metabolic Panel:  Recent Labs Lab 09/30/15 0300 10/01/15 0424 10/03/15 0333  NA 138 139 142  K 4.1 4.5 4.3  CL 101 102  99*  CO2 GLUCOSE 124* 154* 169*  BUN 29* 27* 53*  CREATININE 1.10 1.01 1.45*  CALCIUM 8.8* 8.8* 9.1   CBC:  Recent Labs Lab 09/30/15 0300 10/01/15 0424 10/03/15 0333  WBC 10.8* 10.3 18.2*  NEUTROABS 7.6  --   --   HGB 14.6 14.1 15.2  HCT 46.8 45.5 50.4  MCV 97.9 98.5 99.8  PLT 148* 114* 162   BNP (last 3 results)  Recent Labs  09/30/15 0300 10/01/15 0424  BNP 287.0* 737.9*    CBG:  Recent Labs Lab 10/02/15 1224 10/02/15 1522 10/02/15 2120 10/03/15 0120 10/03/15 0350  GLUCAP 177* 182* 273* 202* 145*    Recent Results (from the past 240 hour(s))  MRSA PCR Screening     Status: None   Collection Time: 09/30/15  9:40 PM  Result Value Ref  Range Status   MRSA by PCR NEGATIVE NEGATIVE Final    Comment:        The GeneXpert MRSA Assay (FDA approved for NASAL specimens only), is one component of a comprehensive MRSA colonization surveillance program. It is not intended to diagnose MRSA infection nor to guide or monitor treatment for MRSA infections.   Culture, respiratory (NON-Expectorated)     Status: None (Preliminary result)   Collection Time: 10/01/15  1:30 PM  Result Value Ref Range Status   Specimen Description SPUTUM  Final   Special Requests NONE  Final   Gram Stain   Final    ABUNDANT WBC PRESENT,BOTH PMN AND MONONUCLEAR NO SQUAMOUS EPITHELIAL CELLS SEEN FEW GRAM POSITIVE COCCI IN CLUSTERS Performed at [facility]    Culture   Final    Culture reincubated for better growth Performed at [facility]    Report Status PENDING  Incomplete  Culture, expectorated sputum-assessment     Status: None   Collection Time: 10/01/15  1:32 PM  Result Value Ref Range Status   Specimen Description SPUTUM  Final   Special Requests NONE  Final   Sputum evaluation   Final    THIS SPECIMEN IS ACCEPTABLE. RESPIRATORY CULTURE REPORT TO FOLLOW.   Report Status 10/01/2015 FINAL  Final      Studies/Results: Dg Chest Port 1 View  10/02/2015  CLINICAL DATA:  82 year old male with shortness of breath EXAM: PORTABLE CHEST 1 VIEW COMPARISON:  Radiograph dated 09/30/2015 FINDINGS: Single-view of the chest demonstrates emphysematous changes of the lungs. Mild interstitial prominence may represent a degree of congestion. No focal consolidation. There is blunting of the right costophrenic angle likely a small pleural effusion. No pneumothorax. Stable cardiomegaly. The osseous structures appear unremarkable. IMPRESSION: Cardiomegaly with possible mild congestion.  No focal consolidation. Emphysema.  No pneumothorax. Small right pleural effusion. Electronically Signed   By: Elgie Collard M.D.   On: 10/02/2015 05:36     Medications:  Scheduled: . antiseptic oral rinse  7 mL Mouth Rinse q12n4p  . budesonide (PULMICORT) nebulizer solution  0.5 mg Nebulization BID  . cefTRIAXone (ROCEPHIN)  IV  1 g Intravenous Q24H  . chlorhexidine  15 mL Mouth Rinse BID  . digoxin  0.125 mg Intravenous Daily  . insulin aspart  0-20 Units Subcutaneous 6 times per day  . ipratropium-albuterol  3 mL Nebulization Q6H  . methylPREDNISolone (SOLU-MEDROL) injection  40 mg Intravenous Q6H  . sodium chloride  3 mL Intravenous Q12H   Continuous:  ZOX:[id number] **OR** acetaminophen, albuterol, fluticasone, metoprolol, morphine injection, nitroGLYCERIN, [DISCONTINUED] ondansetron **OR** ondansetron (ZOFRAN) IV  Assessment/Plan:  Principal Problem:   Acute-on-chronic respiratory failure (HCC) Active Problems:   Abdominal aortic aneurysm (HCC)   Emphysema/COPD (HCC)   Pacemaker-St.Jude   CAD (coronary artery disease)   HLD (hyperlipidemia)   COPD exacerbation (HCC)   Permanent atrial fibrillation (HCC)   Depression   Chronic combined systolic and diastolic congestive heart failure (HCC)   COPD, severe (HCC)   GERD (gastroesophageal reflux disease)   BPH (benign prostatic hyperplasia)   Malnutrition of moderate degree    Acute-on-chronic respiratory failure and COPD exacerbation Patient mental status is improved today compared to yesterday. However, he is still confused. This is all secondary to his respiratory failure. He was placed on BiPAP yesterday. However, he pulled it off last night. Pulmonology is following and managing. Continue morphine as needed for air hunger. Discussed with Dr. Craige Cotta. His prognosis remains poor. Continue nebulizer treatments, steroids and antibiotics for now.   Chronic combined systolic and diastolic congestive heart failure No evidence for acute exacerbation. 2-D echo on 07/09/13 showed EF 35-40 percent. He was given Lasix yesterday. Continue bisoprolol.  CAD Continue medical  management. Patient's chest pain is likely due to his COPD exacerbation.  HLD Continue home medications: Crestor  Atrial Fibrillation CHA2DS2-VASc Score is 6. Patient is on Eliquis at home. Heart rate is reasonably well controlled. Continue Digoxin, cardizem, bisoprolol and Eliquis. Digoxin level is therapeutic  BPH (benign prostatic hyperplasia) Flomax and Proscar  DM-II Continue SSI. HbA1c is 6.7.  DVT Prophylaxis: Patient is on anticoagulation    Code Status: DO NOT RESUSCITATE  Family Communication: No family at bedside. Disposition Plan: Patient continues to be tenuous. His prognosis appears to be poor. Continue management as outlined. Pulmonology is following and managing.    LOS: 3 days   [facility]  [facility] Pager [phone] 10/03/2015, 7:59 AM  If 7PM-7AM, please contact night-coverage at [email or web address], password [facility]

## 2015-10-03 NOTE — Progress Notes (Signed)
PCCM PROGRESS NOTE  ADMISSION DATE: 09/30/2015 CONSULT DATE: 09/30/2015 REFERRING PROVIDER: Dr. Rito Ehrlich  CC: Short of breath  SUBJECTIVE: Pulled BiPAP mask off last night.  Says he didn't understand what was going on and got anxious.  Now that he has better understanding, he is willing to work with BiPAP again.  Feels short of breath and congested.  VITAL SIGNS: BP 151/106 mmHg  Pulse 79  Temp(Src) 97.9 F (36.6 C) (Axillary)  Resp 27  Ht  (1.753 m)  Wt 177 lb 14.6 oz (80.7 kg)  BMI 26.26 kg/m2  SpO2 98%  INTAKE/OUTPUT: I/O last 3 completed shifts: In: 71 [IV Piggyback:50] Out: 825 [Urine:825]  General: alert, increased WOB HEENT: no sinus tenderness Cardiac: irregular, tachycardic no murmur Chest: poor air movement, abdominal breathing, b/l crackles Abd: soft, non tender Ext: ankle edema Neuro: follows commands Skin: no rashes   CBC Recent Labs     10/01/15  0424  10/03/15  0333  WBC  10.3  18.2*  HGB  14.1  15.2  HCT  45.5  50.4  PLT  114*  162    BMET Recent Labs     10/01/15  0424  10/03/15  0333  NA  139  142  K  4.5  4.3  CL  102  99*  CO2  26  30  BUN  27*  53*  CREATININE  1.01  1.45*  GLUCOSE  154*  169*    Electrolytes Recent Labs     10/01/15  0424  10/03/15  0333  CALCIUM  8.8*  9.1    Liver Enzymes Recent Labs     10/01/15  0424  AST  29  ALT  30  ALKPHOS  86  BILITOT  1.0  ALBUMIN  3.9    Glucose Recent Labs     10/02/15  0737  10/02/15  1224  10/02/15  1522  10/02/15  2120  10/03/15  0120  10/03/15  0350  GLUCAP  155*  177*  182*  273*  202*  145*    Imaging Dg Chest Port 1 View  10/02/2015  CLINICAL DATA:  82 year old male with shortness of breath EXAM: PORTABLE CHEST 1 VIEW COMPARISON:  Radiograph dated 09/30/2015 FINDINGS: Single-view of the chest demonstrates emphysematous changes of the lungs. Mild interstitial prominence may represent a degree of congestion. No focal consolidation. There is  blunting of the right costophrenic angle likely a small pleural effusion. No pneumothorax. Stable cardiomegaly. The osseous structures appear unremarkable. IMPRESSION: Cardiomegaly with possible mild congestion.  No focal consolidation. Emphysema.  No pneumothorax. Small right pleural effusion. Electronically Signed   By: Elgie Collard M.D.   On: 10/02/2015 05:36    CULTURES: 1/14 Influenza >> negative 1/14 Pneumococcal Ag >> Positive 1/15 Sputum >>   ANTIBIOTICS: 1/14 Zithromax >> 1/16 1/16 Rocephin >>  STUDIES:  EVENTS: 1/14 Admit 1/16 Obtunded, stuck on BiPAP  DISCUSSION: 82 yo male former smoker with cough, dyspnea, wheeze, hypoxia from AECOPD and Pneumococcal tracheobronchitis.  He has hx of advanced COPD on chronic prednisone, chronic systolic/diastolic CHF with EF 35 to 40% from Oct 2014, Chronic a fib on eliquis, CAD, HTN, HLD, s/p PM, GERD, DM.  He is DNR/DNI.  ASSESSMENT/PLAN:  Acute hypoxic/hypercapnic respiratory failure. Plan: - oxygen to keep SpO2 88 to 95% - Continue BiPAP qhs and prn - prn morphine for dyspnea  AECOPD. Pneumococcal tracheobronchitis. Plan: - pulmicort, albuterol, ipratropium - continue solumedrol 40 mg IV q6h - Day 4 of  Abx, changed to rocephin - f/u CXR 1/18  Chronic combined CHF. Hx of A fib, CAD, HTN, HLD. Plan: - lasix 40 mg IV x one 1/17 - resume eliquis, zebeta, diltiazem, crestor, digoxin - prn IV lopressor for HR > 125 or SBP > 160  Acute encephalopathy likely 2nd to hypercapnia. Plan: - monitor mental status  AKI. Plan: - monitor renal fx while getting lasix  Goals of Care >> DNR/DNI.  D/w Dr. Rito Ehrlich.  CC time 33 minutes.  Coralyn Helling, MD [facility] Pulmonary/Critical Care 10/03/2015, 8:22 AM Pager:  [phone] After 3pm call: [phone]

## 2015-10-04 ENCOUNTER — Inpatient Hospital Stay (HOSPITAL_COMMUNITY): Payer: Medicare Other

## 2015-10-04 ENCOUNTER — Encounter (HOSPITAL_COMMUNITY): Payer: Self-pay | Admitting: Cardiology

## 2015-10-04 DIAGNOSIS — I4891 Unspecified atrial fibrillation: Secondary | ICD-10-CM

## 2015-10-04 LAB — GLUCOSE, CAPILLARY
GLUCOSE-CAPILLARY: 292 mg/dL — AB (ref 65–99)
GLUCOSE-CAPILLARY: 309 mg/dL — AB (ref 65–99)
Glucose-Capillary: 228 mg/dL — ABNORMAL HIGH (ref 65–99)
Glucose-Capillary: 194 mg/dL — ABNORMAL HIGH (ref 65–99)

## 2015-10-04 LAB — CBC
HEMATOCRIT: 49 % (ref 39.0–52.0)
HEMOGLOBIN: 15.1 g/dL (ref 13.0–17.0)
MCH: 30.3 pg (ref 26.0–34.0)
MCHC: 30.8 g/dL (ref 30.0–36.0)
MCV: 98.4 fL (ref 78.0–100.0)
Platelets: 161 10*3/uL (ref 150–400)
RBC: 4.98 MIL/uL (ref 4.22–5.81)
RDW: 14.2 % (ref 11.5–15.5)
WBC: 14.2 10*3/uL — ABNORMAL HIGH (ref 4.0–10.5)

## 2015-10-04 LAB — BASIC METABOLIC PANEL
ANION GAP: 9 (ref 5–15)
BUN: 52 mg/dL — ABNORMAL HIGH (ref 6–20)
CHLORIDE: 98 mmol/L — AB (ref 101–111)
CO2: 34 mmol/L — AB (ref 22–32)
Calcium: 9.4 mg/dL (ref 8.9–10.3)
Creatinine, Ser: 1.23 mg/dL (ref 0.61–1.24)
GFR calc non Af Amer: 52 mL/min — ABNORMAL LOW (ref >60)
GFR, EST AFRICAN AMERICAN: 60 mL/min — AB (ref >60)
Glucose, Bld: 237 mg/dL — ABNORMAL HIGH (ref 65–99)
POTASSIUM: 4.3 mmol/L (ref 3.5–5.1)
SODIUM: 141 mmol/L (ref 135–145)

## 2015-10-04 MED ORDER — LEVALBUTEROL HCL 0.63 MG/3ML IN NEBU
0.6300 mg | INHALATION_SOLUTION | RESPIRATORY_TRACT | Status: DC
  Administered 2015-10-04 – 2015-10-11 (×37): 0.63 mg via RESPIRATORY_TRACT
  Filled 2015-10-04 (×41): qty 3

## 2015-10-04 MED ORDER — MORPHINE SULFATE (PF) 2 MG/ML IV SOLN
1.0000 mg | INTRAVENOUS | Status: DC | PRN
  Administered 2015-10-04: 2 mg via INTRAVENOUS
  Filled 2015-10-04: qty 1

## 2015-10-04 MED ORDER — IPRATROPIUM BROMIDE 0.02 % IN SOLN
0.5000 mg | RESPIRATORY_TRACT | Status: DC
  Administered 2015-10-04 – 2015-10-11 (×36): 0.5 mg via RESPIRATORY_TRACT
  Filled 2015-10-04 (×40): qty 2.5

## 2015-10-04 MED ORDER — METOPROLOL TARTRATE 1 MG/ML IV SOLN
5.0000 mg | INTRAVENOUS | Status: DC | PRN
  Administered 2015-10-04 – 2015-10-05 (×2): 5 mg via INTRAVENOUS
  Filled 2015-10-04 (×2): qty 5

## 2015-10-04 MED ORDER — METHYLPREDNISOLONE SODIUM SUCC 125 MG IJ SOLR
60.0000 mg | Freq: Four times a day (QID) | INTRAMUSCULAR | Status: DC
  Administered 2015-10-04 – 2015-10-09 (×21): 60 mg via INTRAVENOUS
  Filled 2015-10-04 (×21): qty 2

## 2015-10-04 MED ORDER — HALOPERIDOL LACTATE 5 MG/ML IJ SOLN
1.0000 mg | Freq: Four times a day (QID) | INTRAMUSCULAR | Status: DC | PRN
  Administered 2015-10-08: 1 mg via INTRAVENOUS
  Filled 2015-10-04: qty 1

## 2015-10-04 MED ORDER — MORPHINE SULFATE (PF) 2 MG/ML IV SOLN
2.0000 mg | INTRAVENOUS | Status: DC | PRN
  Administered 2015-10-05 – 2015-10-11 (×12): 2 mg via INTRAVENOUS
  Filled 2015-10-04 (×13): qty 1

## 2015-10-04 MED ORDER — MORPHINE SULFATE (PF) 2 MG/ML IV SOLN
1.0000 mg | INTRAVENOUS | Status: DC | PRN
  Administered 2015-10-04 (×2): 1 mg via INTRAVENOUS
  Filled 2015-10-04: qty 1

## 2015-10-04 MED ORDER — FUROSEMIDE 10 MG/ML IJ SOLN
40.0000 mg | Freq: Once | INTRAMUSCULAR | Status: AC
  Administered 2015-10-04: 40 mg via INTRAVENOUS
  Filled 2015-10-04: qty 4

## 2015-10-04 MED ORDER — DILTIAZEM HCL ER 240 MG PO CP24: 240.0000 mg | ORAL_CAPSULE | Freq: Every day | ORAL | Status: DC

## 2015-10-04 NOTE — Progress Notes (Addendum)
CSW received referral that pt admitted from [facility].   CSW visited pt room and pt resting at this time. No family present at bedside.   CSW spoke with RN who discussed that pt has been on high flow nasal cannula as he declined BiPap, but may have to transition back to BiPap as pt O2 needs are not progressing. Per RN, question if MD may get palliative involved if pt continues to not progress with O2 needs.   At this time, it is too soon to determine how best to assist pt surrounding disposition needs.   CSW to continue to follow progress and complete full psychosocial assessment when appropriate.  Loletta Specter, MSW, LCSW Clinical Social Work Coverage for [employer], [place] [phone]

## 2015-10-04 NOTE — Progress Notes (Signed)
Pt states "I'm finished" with Bipap and is no longer wanting to wear the mask. Breath sounds continue to be diminished bilaterally with continued labored and increased WOB with accessory muscle use. MD was notified. PT O2 saturation currently stable at 99% on 6L HFNC. Will continue to monitor patient.

## 2015-10-04 NOTE — Progress Notes (Signed)
Date: October 04, 2015 Chart reviewed for concurrent status and case management needs. Will continue to follow patient for changes and needs:  Remains on bipap Marcelle Smiling, RN, BSN, [place]   [phone]

## 2015-10-04 NOTE — Progress Notes (Signed)
PCCM PROGRESS NOTE  ADMISSION DATE: 09/30/2015 CONSULT DATE: 09/30/2015 REFERRING PROVIDER: Dr. Rito Ehrlich  CC: Short of breath  SUBJECTIVE: Back on BiPAP this morning >> he feels this helps.  VITAL SIGNS: BP 133/84 mmHg  Pulse 120  Temp(Src) 97.7 F (36.5 C) (Axillary)  Resp 18  Ht  (1.753 m)  Wt 174 lb 13.2 oz (79.3 kg)  BMI 25.81 kg/m2  SpO2 100%  INTAKE/OUTPUT: I/O last 3 completed shifts: In: 410 [P.O.:360; IV Piggyback:50] Out: 1775 [Urine:1775]  General: sleepy, increased WOB HEENT: no sinus tenderness Cardiac: irregular, tachycardic no murmur Chest: prolonged exhalation, faint b/l wheeze Abd: soft, non tender Ext: ankle edema Neuro: follows commands Skin: no rashes   CBC Recent Labs     10/03/15  0333  10/04/15  0357  WBC  18.2*  14.2*  HGB  15.2  15.1  HCT  50.4  49.0  PLT  162  161    BMET Recent Labs     10/03/15  0333  10/04/15  0357  NA  142  141  K  4.3  4.3  CL  99*  98*  CO2  30  34*  BUN  53*  52*  CREATININE  1.45*  1.23  GLUCOSE  169*  237*    Electrolytes Recent Labs     10/03/15  0333  10/04/15  0357  CALCIUM  9.1  9.4    Liver Enzymes No results for input(s): AST, ALT, ALKPHOS, BILITOT, ALBUMIN in the last 72 hours.  Glucose Recent Labs     10/03/15  0350  10/03/15  0745  10/03/15  1130  10/03/15  1613  10/03/15  2149  10/04/15  0832  GLUCAP  145*  126*  104*  208*  294*  292*    Imaging Dg Chest Port 1 View  10/04/2015  CLINICAL DATA:  Respiratory failure. EXAM: PORTABLE CHEST 1 VIEW COMPARISON:  10/02/2015 .  09/30/2015.  03/29/2015.  03/27/2014 . FINDINGS: Mediastinum and hilar structures normal. Cardiac pacer in stable position. Cardiomegaly. Stable right base pleural parenchymal thickening. No new infiltrate. Cardiac pacer with lead tip projected over the right ventricle . Cardiomegaly with normal pulmonary vascularity. No pleural effusion or pneumothorax . IMPRESSION: 1. Cardiac pacer with lead tip  projected over the right ventricle. Stable cardiomegaly. Scratched 2. Right base pleural-parenchymal thickening consistent with scarring. Electronically Signed   By: Maisie Fus  Register   On: 10/04/2015 07:46    CULTURES: 1/14 Influenza >> negative 1/14 Pneumococcal Ag >> Positive 1/15 Sputum >> oral flora  ANTIBIOTICS: 1/14 Zithromax >> 1/16 1/16 Rocephin >>  STUDIES:  EVENTS: 1/14 Admit 1/16 Obtunded, stuck on BiPAP 1/17 Mental status improved 1/18 Back on BiPAP  DISCUSSION: 82 yo male former smoker with cough, dyspnea, wheeze, hypoxia from AECOPD and Pneumococcal tracheobronchitis.  He has hx of advanced COPD on chronic prednisone, chronic systolic/diastolic CHF with EF 35 to 40% from Oct 2014, Chronic a fib on eliquis, CAD, HTN, HLD, s/p PM, GERD, DM.  He is DNR/DNI.  ASSESSMENT/PLAN:  Acute hypoxic/hypercapnic respiratory failure. Plan: - oxygen to keep SpO2 88 to 95% - Continue BiPAP qhs and prn - prn morphine for dyspnea  AECOPD. Pneumococcal tracheobronchitis. Plan: - pulmicort, albuterol, ipratropium - continue solumedrol 40 mg IV q6h - Day 5 of Abx, changed to rocephin - f/u CXR intermittently  Chronic combined CHF. Hx of A fib, CAD, HTN, HLD. Plan: - lasix 40 mg IV x one 1/18 - continue eliquis, zebeta, diltiazem, crestor, digoxin -  prn IV lopressor for HR > 125 or SBP > 160  Acute encephalopathy likely 2nd to hypercapnia. Plan: - monitor mental status  AKI. Plan: - monitor renal fx while getting lasix  Goals of Care >> DNR/DNI.  CC time 32 minutes.  Coralyn Helling, MD [facility] Pulmonary/Critical Care 10/04/2015, 10:12 AM Pager:  [phone] After 3pm call: [phone]

## 2015-10-04 NOTE — Progress Notes (Addendum)
[facility] PROGRESS NOTE    Progress Note   [name] WUJ:[id number] DOB: May 30, 1928 DOA: 09/30/2015 PCP: Martha Clan, MD   Brief Narrative:   [name] is an 82 y.o. male With past medical history of advanced COPD on home oxygen, diabetes mellitus, CAD atrial fibrillation on apixiban chronic systolic and diastolic heart failure comes to the ED complaining of cough and shortness of breath. She was found to be in acute COPD exacerbation placed on BiPAP and admitted to the hospital. She was started on IV Solu-Medrol steroids and PCCM was consulted. Patient  Cannot tolerate BiPAP when necessary.  Assessment/Plan:   Acute-on-chronic respiratory failure (HCC) Due to acute COPD exacerbation/acute encephalopathy: Mental status has improved according to previous notes. PCCM was consulted and recommended to continue BiPAP and IV morphine,Repeat morphine and start BiPAP. They agreed with IV Solu-Medrol steroids and inhalers. I will increase steroids he continues to have poor air movement. Breathing greater than 25 times per minute using  Accessory muscles to breathe. Poor air movement with wheezing bilaterally. Given morphine and will see if he can tolerate BiPAP. I spent more than 35 minutes discussing with the patient is options. Patient would like to cont aggressive treatment.  Chronic, systolic and diastolic heart failure: Seems to be euvolemic no evidences exacerbation continue current regimen.  CAD: Asymptomatic continue medical management.  Atrial Fibrillation with RVR CHA2DS2-VASc Score is 6. Patient is on Eliquis. He is still in RVR despite diltiazem and beta-blockers. Will consult cardiology, might benefits from amiodarone.  Controlled diabetes mellitus type 2: A1c was 6.7.  Acute confusional state: He becomes confusing in the afternoon, will use Haldol when necessary when necessary.  BPH: Continue Flomax and Proscar  Malnutrition of moderate  degree     DVT Prophylaxis - Lovenox ordered.  Family Communication: daughter Disposition Plan: keep in SDU Code Status:     Code Status Orders        Start     Ordered   09/30/15 0534  Do not attempt resuscitation (DNR)   Continuous    Question Answer Comment  In the event of cardiac or respiratory ARREST Do not call a "code blue"   In the event of cardiac or respiratory ARREST Do not perform Intubation, CPR, defibrillation or ACLS   In the event of cardiac or respiratory ARREST Use medication by any route, position, wound care, and other measures to relive pain and suffering. May use oxygen, suction and manual treatment of airway obstruction as needed for comfort.      09/30/15 0534    Code Status History    Date Active Date Inactive Code Status Order ID Comments User Context   03/27/2014 10:53 PM 03/29/2014  2:51 PM DNR [id number]  Alysia Penna, MD Inpatient   12/31/2013  5:02 PM 01/06/2014  6:19 PM DNR [id number]  Marrian Salvage, MD ED   09/09/2013  4:03 PM 09/17/2013  2:54 PM DNR [id number]  Hoyle Sauer, MD Inpatient   07/09/2013  7:51 PM 07/13/2013  1:34 PM DNR [id number]  Jarome Matin, MD Inpatient   07/09/2013  1:18 PM 07/09/2013  7:51 PM Full Code [id number]  Jarome Matin, MD ED   09/28/2012  7:29 AM 10/01/2012  6:12 PM DNR [id number]  Kari Baars, MD Inpatient   04/04/2012  1:02 PM 04/14/2012  2:29 PM DNR [id number]  Jarome Matin, MD ED   04/04/2012 12:57 PM 04/04/2012  1:02 PM DNR [id number]  Jarome Matin, MD ED   03/10/2012  5:58 PM 03/19/2012  4:40 PM DNR [id number]  Tammy Sours, RN Inpatient   11/13/2011 11:11 AM 11/15/2011  8:15 PM DNR [id number]  Lorinda Creed, NP Inpatient   11/08/2011  3:03 PM 11/13/2011 11:10 AM Partial Code [id number]  Kalman Shan, MD Inpatient   11/07/2011  4:13 PM 11/08/2011  3:03 PM Full Code [id number]  Theora Gianotti, RN Inpatient    Advance Directive Documentation        Most Recent Value   Type of Advance Directive  Healthcare Power of  Attorney, Living will   Pre-existing out of facility DNR order (yellow form or pink MOST form)     "MOST" Form in Place?          IV Access:    Peripheral IV   Procedures and diagnostic studies:   Dg Chest Port 1 View  10/11/15  CLINICAL DATA:  Respiratory failure. EXAM: PORTABLE CHEST 1 VIEW COMPARISON:  10/02/2015 .  09/30/2015.  03/29/2015.  03/27/2014 . FINDINGS: Mediastinum and hilar structures normal. Cardiac pacer in stable position. Cardiomegaly. Stable right base pleural parenchymal thickening. No new infiltrate. Cardiac pacer with lead tip projected over the right ventricle . Cardiomegaly with normal pulmonary vascularity. No pleural effusion or pneumothorax . IMPRESSION: 1. Cardiac pacer with lead tip projected over the right ventricle. Stable cardiomegaly. Scratched 2. Right base pleural-parenchymal thickening consistent with scarring. Electronically Signed   By: Maisie Fus  Register   On: 2015-10-11 07:46     Medical Consultants:    None.  Anti-Infectives:   Anti-infectives    Start     Dose/Rate Route Frequency Ordered Stop   10/02/15 1000  cefTRIAXone (ROCEPHIN) 1 g in dextrose 5 % 50 mL IVPB     1 g 100 mL/hr over 30 Minutes Intravenous Every 24 hours 10/02/15 0912     10/01/15 1000  azithromycin (ZITHROMAX) tablet 250 mg  Status:  Discontinued     250 mg Oral Daily 09/30/15 0530 10/02/15 0920   09/30/15 1000  azithromycin (ZITHROMAX) tablet 500 mg     500 mg Oral Daily 09/30/15 0530 09/30/15 0944      Subjective:    [name] He is awake today tolerated his diet.  Objective:    Filed Vitals:   10/11/15 0400 2015/10/11 0500 2015/10/11 0600 10-11-15 0700  BP: 141/61     Pulse:      Temp: 97.7 F (36.5 C)     TempSrc: Axillary     Resp: Height:      Weight:      SpO2:        Intake/Output Summary (Last 24 hours) at 10-11-15 0810 Last data filed at 2015/10/11 0644  Gross per 24 hour  Intake    170 ml  Output   1525 ml  Net   -1355 ml   Filed Weights   10/01/15 0401 10/03/15 0425 2015/10/11 0338  Weight: 82.6 kg (182 lb 1.6 oz) 80.7 kg (177 lb 14.6 oz) 79.3 kg (174 lb 13.2 oz)    Exam: Gen:  Awake alert and oriented 3 Cardiovascular:  RRR. Chest and lungs:   Poor air movement with wheezing bilaterally. Using external muscles to breathe Abdomen:  Abdomen soft, NT/ND, + BS Extremities:  No edema.   Data Reviewed:    Labs: Basic Metabolic Panel:  Recent Labs Lab 09/30/15 0300 10/01/15 0424 10/03/15 0333 10-11-2015 0357  NA 138 139 142 141  K 4.1 4.5 4.3 4.3  CL 101 102  99* 98*  CO2 34*  GLUCOSE 124* 154* 169* 237*  BUN 29* 27* 53* 52*  CREATININE 1.10 1.01 1.45* 1.23  CALCIUM 8.8* 8.8* 9.1 9.4   GFR Estimated Creatinine Clearance: 43.9 mL/min (by C-G formula based on Cr of 1.23). Liver Function Tests:  Recent Labs Lab 10/01/15 0424  AST 29  ALT 30  ALKPHOS 86  BILITOT 1.0  PROT 6.3*  ALBUMIN 3.9   No results for input(s): LIPASE, AMYLASE in the last 168 hours. No results for input(s): AMMONIA in the last 168 hours. Coagulation profile  Recent Labs Lab 09/30/15 0317  INR 1.45    CBC:  Recent Labs Lab 09/30/15 0300 10/01/15 0424 10/03/15 0333 10/04/15 0357  WBC 10.8* 10.3 18.2* 14.2*  NEUTROABS 7.6  --   --   --   HGB 14.6 14.1 15.2 15.1  HCT 46.8 45.5 50.4 49.0  MCV 97.9 98.5 99.8 98.4  PLT 148* 114* 162 161   Cardiac Enzymes: No results for input(s): CKTOTAL, CKMB, CKMBINDEX, TROPONINI in the last 168 hours. BNP (last 3 results) No results for input(s): PROBNP in the last 8760 hours. CBG:  Recent Labs Lab 10/03/15 0350 10/03/15 0745 10/03/15 1130 10/03/15 1613 10/03/15 2149  GLUCAP 145* 126* 104* 208* 294*   D-Dimer: No results for input(s): DDIMER in the last 72 hours. Hgb A1c: No results for input(s): HGBA1C in the last 72 hours. Lipid Profile: No results for input(s): CHOL, HDL, LDLCALC, TRIG, CHOLHDL, LDLDIRECT in the last 72  hours. Thyroid function studies: No results for input(s): TSH, T4TOTAL, T3FREE, THYROIDAB in the last 72 hours.  Invalid input(s): FREET3 Anemia work up: No results for input(s): VITAMINB12, FOLATE, FERRITIN, TIBC, IRON, RETICCTPCT in the last 72 hours. Sepsis Labs:  Recent Labs Lab 09/30/15 0300 10/01/15 0424 10/03/15 0333 10/04/15 0357  WBC 10.8* 10.3 18.2* 14.2*   Microbiology Recent Results (from the past 240 hour(s))  MRSA PCR Screening     Status: None   Collection Time: 09/30/15  9:40 PM  Result Value Ref Range Status   MRSA by PCR NEGATIVE NEGATIVE Final    Comment:        The GeneXpert MRSA Assay (FDA approved for NASAL specimens only), is one component of a comprehensive MRSA colonization surveillance program. It is not intended to diagnose MRSA infection nor to guide or monitor treatment for MRSA infections.   Culture, respiratory (NON-Expectorated)     Status: None   Collection Time: 10/01/15  1:30 PM  Result Value Ref Range Status   Specimen Description SPUTUM  Final   Special Requests NONE  Final   Gram Stain   Final    ABUNDANT WBC PRESENT,BOTH PMN AND MONONUCLEAR NO SQUAMOUS EPITHELIAL CELLS SEEN FEW GRAM POSITIVE COCCI IN CLUSTERS Performed at [facility]    Culture   Final    NORMAL OROPHARYNGEAL FLORA Performed at [facility]    Report Status 10/03/2015 FINAL  Final  Culture, expectorated sputum-assessment     Status: None   Collection Time: 10/01/15  1:32 PM  Result Value Ref Range Status   Specimen Description SPUTUM  Final   Special Requests NONE  Final   Sputum evaluation   Final    THIS SPECIMEN IS ACCEPTABLE. RESPIRATORY CULTURE REPORT TO FOLLOW.   Report Status 10/01/2015 FINAL  Final     Medications:   . antiseptic oral rinse  7 mL Mouth Rinse q12n4p  . apixaban  5 mg Oral BID  .  bisoprolol  10 mg Oral Daily  . budesonide (PULMICORT) nebulizer solution  0.5 mg Nebulization BID  . cefTRIAXone (ROCEPHIN)  IV   1 g Intravenous Q24H  . chlorhexidine  15 mL Mouth Rinse BID  . digoxin  0.125 mg Oral Daily  . diltiazem  240 mg Oral Daily  . finasteride  5 mg Oral Daily  . insulin aspart  0-20 Units Subcutaneous TID WC  . insulin aspart  0-5 Units Subcutaneous QHS  . ipratropium-albuterol  3 mL Nebulization Q6H  . methylPREDNISolone (SOLU-MEDROL) injection  40 mg Intravenous Q6H  . rosuvastatin  40 mg Oral Daily  . sodium chloride  3 mL Intravenous Q12H  . tamsulosin  0.4 mg Oral BID   Continuous Infusions:   Time spent: 35 min   LOS: 4 days   Marinda Elk  [facility] Pager [phone]  *Please refer to [email or web address], password [email or web address] to get updated schedule on who will round on this patient, as hospitalists switch teams weekly. If 7PM-7AM, please contact night-coverage at [email or web address], password [email or web address] for any overnight needs.  10/04/2015, 8:10 AM

## 2015-10-04 NOTE — Progress Notes (Signed)
Inpatient Diabetes Program Recommendations  AACE/ADA: New Consensus Statement on Inpatient Glycemic Control (2015)  Target Ranges:  Prepandial:   less than 140 mg/dL      Peak postprandial:   less than 180 mg/dL (1-2 hours)      Critically ill patients:  140 - 180 mg/dL   Review of Glycemic Control  Inpatient Diabetes Program Recommendations:  Correction (SSI): consider changing Novolog to Q4 during steroid therapy  Thank you  Shawne Bulow BSN, RN,CDE Inpatient Diabetes Coordinator [phone] (team pager)     

## 2015-10-04 NOTE — Consult Note (Signed)
Reason for Consult: a fib RVR,    Referring Physician:  Dr. Olevia Bowens  PCP:  Marton Redwood, MD  Primary Cardiologist:Dr. Mare Ferrari EP: Dr. Ivan Croft is an 82 y.o. male.    Chief Complaint: Pt admitted 09/30/15 after presenting with cough and SOB.     HPI:   Asked to see 82 year old male with hx of COPD, chronic a fib, v. Tach, CAD (STEMI in 2011 and 07/2011 cath with PCI with DES to LCX for total occl. Chronic total occl of RCA with Lt->rt collaterals, nonobstructive LAD stenosis)   Hx of ICM -CHF with EF 25% 2012 and hx. symptomatic brady requiring pacemaker-2012 St. Jude device.   EF 35-40% on last echo 06/2013, moderate MR.  L atrium and R atrium moderately dilated now with a fib with RVR.  Pt admitted 09/29/14  With acute on chronic respiratory failure and COPD exacerbation.  On BiPAP, nebs and IV steroids.  Not felt to have HF at that time.    CHA2DS2-VASc Score is 6 and is on Eliquis.  Pulmonary was consulted for his advanced COPD.  Pt has had confusion thought to be encephalopathy 2nd to hypercapnia and continues on BiPAP, receives PRN morphine.  Pt is DNR/DNI but would like to continue aggressive treatment.   His HR is remaining 120 to 140s- BP is stable to elevated.  There is questions of amiodarone to help with rate control.  Currently on Zebeta 10, dig 0.125,  Dilt. 240.    On admit dig level 0.8, BNP 737,  Troponin 0.08.  EKG on admit a fib rate controlled. Similar to EKG 06/2015.  He denies chest pain.  No chest pain prior to admit.     Past Medical History  Diagnosis Date  . Pneumonia 12/06/10    healthcare-associated/ left lower lobe  . COPD (chronic obstructive pulmonary disease) (HCC)     severe stage IV  . Atrial fibrillation ([place])     on Coumadin with St. Jude single-chamber pacemaker  . Diabetes mellitus     type 2; s/p Prednisone therapy for pneumonia 12/2010  . Coronary artery disease     s/p anterior STEMI 09/2009; cath. revealed mid  LAD 40%, distal LAD 100%- PTCA, prox. RCA 40%, mid. RCA 100% with good collateral filling of PDA; EF 25%; NSTEMI 07/2011 - PCI/DES 100% LCX  . ST elevation (STEMI) myocardial infarction ([place]) 01//11/12    anterior  . CHF (congestive heart failure) (HCC)     EF 25% on 09/26/10; EF 50-55% and grade 1 diastolic dysfunction on ECHO 08/2010   . Hypertension   . Hyperlipidemia   . AAA (abdominal aortic aneurysm) (HCC)     3 cm infrarenal abdominal aortic aneurysm per aorta ultrasound 03/2010  . Osteoarthritis     s/p bilat hip arthroplasty  . Angina   . Ischemic cardiomyopathy     EF 35% LHC 11/12  . GERD (gastroesophageal reflux disease)   . Hypercholesteremia   . PVD (peripheral vascular disease) ([place])   . Anxiety     Past Surgical History  Procedure Laterality Date  . Hip arthroplasty      s/p right 09/27/10 and s/p left 09/24/10  . Back surgery    . Knee surgery    . Pacemaker insertion  12/2010    St. Jude single-chamber   . Cardiac catheterization  09/2009, 06/2007    PTCA to distal LAD  . Percutaneous coronary  stent intervention (pci-s) N/A 08/13/2011    Procedure: PERCUTANEOUS CORONARY STENT INTERVENTION (PCI-S);  Surgeon: Sherren Mocha, MD;  Location: [facility] CATH LAB;  Service: Cardiovascular;  Laterality: N/A;  . Left heart catheterization with coronary angiogram N/A 08/13/2011    Procedure: LEFT HEART CATHETERIZATION WITH CORONARY ANGIOGRAM;  Surgeon: Sherren Mocha, MD;  Location[facility] CATH LAB;  Service: Cardiovascular;  Laterality: N/A;    Family History  Problem Relation Age of Onset  . Heart attack Mother 82  . Heart attack Father 51  . Cancer Other     siblings  . Heart attack Other    Social History:  reports that he quit smoking about 6 years ago. His smoking use included Cigarettes. He has a 100 pack-year smoking history. He has never used smokeless tobacco. He reports that he drinks alcohol. He reports that he does not use illicit drugs.  Allergies: No Known  Allergies  OUTPATIENT MEDICATION: No current facility-administered medications on file prior to encounter.   Current Outpatient Prescriptions on File Prior to Encounter  Medication Sig Dispense Refill  . albuterol (PROVENTIL) (2.5 MG/3ML) 0.083% nebulizer solution Take 2.5 mg by nebulization every 6 (six) hours as needed for wheezing or shortness of breath.    . ALPRAZolam (XANAX) 0.25 MG tablet Take 1 tablet (0.25 mg total) by mouth at bedtime as needed for anxiety. 10 tablet 0  . apixaban (ELIQUIS) 5 MG TABS tablet Take 5 mg by mouth 2 (two) times daily.    . beclomethasone (QVAR) 80 MCG/ACT inhaler Inhale 1 puff into the lungs 2 (two) times daily.    . cholecalciferol (VITAMIN D) 1000 UNITS tablet Take 2,000 Units by mouth daily.    . digoxin (LANOXIN) 0.125 MG tablet Take 0.125 mg by mouth daily.    Marland Kitchen diltiazem (DILACOR XR) 240 MG 24 hr capsule Take 240 mg by mouth daily.    . finasteride (PROSCAR) 5 MG tablet Take 5 mg by mouth daily.    . fluticasone (FLONASE) 50 MCG/ACT nasal spray Place 2 sprays into both nostrils daily. (Patient taking differently: Place 2 sprays into both nostrils daily as needed for allergies. ) 16 g 2  . furosemide (LASIX) 20 MG tablet Take 20 mg by mouth daily.     Marland Kitchen glimepiride (AMARYL) 2 MG tablet Take 1 tablet (2 mg total) by mouth daily with breakfast. 30 tablet 6  . guaiFENesin (MUCINEX) 600 MG 12 hr tablet Take 600 mg by mouth at bedtime.     Marland Kitchen ipratropium (ATROVENT) 0.02 % nebulizer solution Take 2.5 mLs (0.5 mg total) by nebulization every 6 (six) hours. 75 mL 12  . nitroGLYCERIN (NITROSTAT) 0.4 MG SL tablet Place 0.4 mg under the tongue every 5 (five) minutes as needed (MAX 3 TABLETS). For chest pain    . predniSONE (DELTASONE) 5 MG tablet Take 5 mg by mouth daily with breakfast.    . rosuvastatin (CRESTOR) 40 MG tablet Take 40 mg by mouth daily.    . sennosides-docusate sodium (SENOKOT-S) 8.6-50 MG tablet Take 2 tablets by mouth daily as needed for  constipation.     . Tamsulosin HCl (FLOMAX) 0.4 MG CAPS Take 0.4 mg by mouth 2 (two) times daily.     Marland Kitchen tiotropium (SPIRIVA HANDIHALER) 18 MCG inhalation capsule Place 18 mcg into inhaler and inhale daily.    . traMADol (ULTRAM) 50 MG tablet Take 50 mg by mouth every 8 (eight) hours as needed. for pain  5  . morphine 10 MG/5ML solution Take  2 mLs (4 mg total) by mouth 2 (two) times daily as needed (refractory dyspnea). 120 mL 0   CURRENT MEDICATIONS: Scheduled Meds: . antiseptic oral rinse  7 mL Mouth Rinse q12n4p  . apixaban  5 mg Oral BID  . bisoprolol  10 mg Oral Daily  . budesonide (PULMICORT) nebulizer solution  0.5 mg Nebulization BID  . cefTRIAXone (ROCEPHIN)  IV  1 g Intravenous Q24H  . chlorhexidine  15 mL Mouth Rinse BID  . digoxin  0.125 mg Oral Daily  . diltiazem  240 mg Oral Daily  . finasteride  5 mg Oral Daily  . insulin aspart  0-20 Units Subcutaneous TID WC  . insulin aspart  0-5 Units Subcutaneous QHS  . ipratropium  0.5 mg Nebulization Q4H  . levalbuterol  0.63 mg Nebulization 6 times per day  . methylPREDNISolone (SOLU-MEDROL) injection  60 mg Intravenous Q6H  . rosuvastatin  40 mg Oral Daily  . sodium chloride  3 mL Intravenous Q12H  . tamsulosin  0.4 mg Oral BID   Continuous Infusions:  PRN Meds:.acetaminophen **OR** acetaminophen, fluticasone, haloperidol lactate, metoprolol, morphine injection, nitroGLYCERIN, [DISCONTINUED] ondansetron **OR** ondansetron (ZOFRAN) IV    Results for orders placed or performed during the hospital encounter of 09/30/15 (from the past 48 hour(s))  Glucose, capillary     Status: Abnormal   Collection Time: 10/02/15  9:20 PM  Result Value Ref Range   Glucose-Capillary 273 (H) 65 - 99 mg/dL  Glucose, capillary     Status: Abnormal   Collection Time: 10/03/15  1:20 AM  Result Value Ref Range   Glucose-Capillary 202 (H) 65 - 99 mg/dL   Comment 1 Notify RN   CBC     Status: Abnormal   Collection Time: 10/03/15  3:33 AM  Result  Value Ref Range   WBC 18.2 (H) 4.0 - 10.5 K/uL   RBC 5.05 4.22 - 5.81 MIL/uL   Hemoglobin 15.2 13.0 - 17.0 g/dL   HCT 50.4 39.0 - 52.0 %   MCV 99.8 78.0 - 100.0 fL   MCH 30.1 26.0 - 34.0 pg   MCHC 30.2 30.0 - 36.0 g/dL   RDW 14.8 11.5 - 15.5 %   Platelets 162 150 - 400 K/uL  Basic metabolic panel     Status: Abnormal   Collection Time: 10/03/15  3:33 AM  Result Value Ref Range   Sodium 142 135 - 145 mmol/L   Potassium 4.3 3.5 - 5.1 mmol/L   Chloride 99 (L) 101 - 111 mmol/L   CO2 30 22 - 32 mmol/L   Glucose, Bld 169 (H) 65 - 99 mg/dL   BUN 53 (H) 6 - 20 mg/dL   Creatinine, Ser 1.45 (H) 0.61 - 1.24 mg/dL   Calcium 9.1 8.9 - 10.3 mg/dL   GFR calc non Af Amer 42 (L) >60 mL/min   GFR calc Af Amer 49 (L) >60 mL/min    Comment: (NOTE) The eGFR has been calculated using the CKD EPI equation. This calculation has not been validated in all clinical situations. eGFR's persistently <60 mL/min signify possible Chronic Kidney Disease.    Anion gap 13 5 - 15  Glucose, capillary     Status: Abnormal   Collection Time: 10/03/15  3:50 AM  Result Value Ref Range   Glucose-Capillary 145 (H) 65 - 99 mg/dL   Comment 1 Notify RN   Glucose, capillary     Status: Abnormal   Collection Time: 10/03/15  7:45 AM  Result Value Ref  Range   Glucose-Capillary 126 (H) 65 - 99 mg/dL   Comment 1 Notify RN    Comment 2 Document in Chart   Glucose, capillary     Status: Abnormal   Collection Time: 10/03/15 11:30 AM  Result Value Ref Range   Glucose-Capillary 104 (H) 65 - 99 mg/dL   Comment 1 Notify RN    Comment 2 Document in Chart   Glucose, capillary     Status: Abnormal   Collection Time: 10/03/15  4:13 PM  Result Value Ref Range   Glucose-Capillary 208 (H) 65 - 99 mg/dL   Comment 1 Notify RN    Comment 2 Document in Chart   Glucose, capillary     Status: Abnormal   Collection Time: 10/03/15  9:49 PM  Result Value Ref Range   Glucose-Capillary 294 (H) 65 - 99 mg/dL   Comment 1 Notify RN     Comment 2 Document in Chart   CBC     Status: Abnormal   Collection Time: 10/04/15  3:57 AM  Result Value Ref Range   WBC 14.2 (H) 4.0 - 10.5 K/uL   RBC 4.98 4.22 - 5.81 MIL/uL   Hemoglobin 15.1 13.0 - 17.0 g/dL   HCT 49.0 39.0 - 52.0 %   MCV 98.4 78.0 - 100.0 fL   MCH 30.3 26.0 - 34.0 pg   MCHC 30.8 30.0 - 36.0 g/dL   RDW 14.2 11.5 - 15.5 %   Platelets 161 150 - 400 K/uL  Basic metabolic panel     Status: Abnormal   Collection Time: 10/04/15  3:57 AM  Result Value Ref Range   Sodium 141 135 - 145 mmol/L   Potassium 4.3 3.5 - 5.1 mmol/L   Chloride 98 (L) 101 - 111 mmol/L   CO2 34 (H) 22 - 32 mmol/L   Glucose, Bld 237 (H) 65 - 99 mg/dL   BUN 52 (H) 6 - 20 mg/dL   Creatinine, Ser 1.23 0.61 - 1.24 mg/dL   Calcium 9.4 8.9 - 10.3 mg/dL   GFR calc non Af Amer 52 (L) >60 mL/min   GFR calc Af Amer 60 (L) >60 mL/min    Comment: (NOTE) The eGFR has been calculated using the CKD EPI equation. This calculation has not been validated in all clinical situations. eGFR's persistently <60 mL/min signify possible Chronic Kidney Disease.    Anion gap 9 5 - 15  Glucose, capillary     Status: Abnormal   Collection Time: 10/04/15  8:32 AM  Result Value Ref Range   Glucose-Capillary 292 (H) 65 - 99 mg/dL   Comment 1 Notify RN    Comment 2 Document in Chart   Glucose, capillary     Status: Abnormal   Collection Time: 10/04/15 12:19 PM  Result Value Ref Range   Glucose-Capillary 309 (H) 65 - 99 mg/dL   Comment 1 Notify RN    Comment 2 Document in Chart    Dg Chest Port 1 View  10/04/2015  CLINICAL DATA:  Respiratory failure. EXAM: PORTABLE CHEST 1 VIEW COMPARISON:  10/02/2015 .  09/30/2015.  03/29/2015.  03/27/2014 . FINDINGS: Mediastinum and hilar structures normal. Cardiac pacer in stable position. Cardiomegaly. Stable right base pleural parenchymal thickening. No new infiltrate. Cardiac pacer with lead tip projected over the right ventricle . Cardiomegaly with normal pulmonary vascularity.  No pleural effusion or pneumothorax . IMPRESSION: 1. Cardiac pacer with lead tip projected over the right ventricle. Stable cardiomegaly. Scratched 2. Right base pleural-parenchymal thickening  consistent with scarring. Electronically Signed   By: Marcello Moores  Register   On: 10/04/2015 07:46    ROS: General:no colds or fevers,  weight decreasing Skin:no rashes or ulcers HEENT:no blurred vision, no congestion CV:see HPI PUL:see HPI GI:no diarrhea constipation or melena, no indigestion GU:no hematuria, no dysuria MS:no joint pain, no claudication Neuro:no syncope, no lightheadedness Endo:no diabetes, no thyroid disease   Blood pressure 119/101, pulse 128, temperature 97.8 F (36.6 C), temperature source Axillary, resp. rate 18, height _0  (1.753 m), weight 174 lb 13.2 oz (79.3 kg), SpO2 100 %.  Wt Readings from Last 3 Encounters:  10/04/15 174 lb 13.2 oz (79.3 kg)  09/27/15 183 lb (83.008 kg)  07/12/15 185 lb (83.915 kg)    PE: General:Pleasant affect, states he is comfortable but is still working to breathe Skin:Warm to cool and dry, brisk capillary refill HEENT:normocephalic, sclera clear, mucus membranes moist Neck:supple, no JVD- sitting up, no bruits  Heart:irregular and rapid without murmur, gallup, rub or click Lungs: without rales, + rhonchi, or wheezes BTY:OMAY, non tender, + BS, do not palpate liver spleen or masses Ext:no lower ext edema, 2+ pedal pulses, 2+ radial pulses Neuro:alert and oriented X 3, MAE, follows commands, + facial symmetry    Assessment/Plan Principal Problem:   Acute-on-chronic respiratory failure (HCC) Active Problems:   Abdominal aortic aneurysm (HCC)   Emphysema/COPD (HCC)   Pacemaker-St.Jude   CAD (coronary artery disease)   HLD (hyperlipidemia)   COPD exacerbation (HCC)   Permanent atrial fibrillation (HCC)   Depression   Chronic combined systolic and diastolic congestive heart failure (HCC)   COPD, severe (HCC)   GERD (gastroesophageal  reflux disease)   BPH (benign prostatic hyperplasia)   Malnutrition of moderate degree  Atrial fib with RVR  HR on his normal meds uncontrolled plan per Dr. Percival Spanish  CAD with hx MI neg troponin, no chest pain.  COPD per IM  [facility]  Nurse Practitioner Certified [place] Pager [phone] or after 5pm or weekends call [phone] 10/04/2015, 4:58 PM  History and all data above reviewed.  Patient examined.  I agree with the findings as above. He has permanent atrial fib and has had AVR since being in the hospital with acute on chronic lung disease and respiratory failure.   He currently looks chronically ill and fatigued but is able to answer my questions.  He denies any pain.  He says his breathing is "OK" but he has obvious tachypnea.  The patient presents with acute on chronic lung disease.  The patient exam reveals SFS:ELTRVUYEB and rapid  ,  Lungs: Decreased breath sounds  ,  Abd: Positive bowel sounds, no rebound no guarding, Ext No edema  .  All available labs, radiology testing, previous records reviewed. Agree with documented assessment and plan. Atrial fib with RVR:  With continued acute dyspnea we are unlikely to get great HR control.  I will try to start with increased calcium channel blocker rather than starting amiodarone.   Jeneen Rinks Samanthamarie Ezzell  5:57 PM  10/04/2015

## 2015-10-05 DIAGNOSIS — I4891 Unspecified atrial fibrillation: Secondary | ICD-10-CM | POA: Diagnosis present

## 2015-10-05 LAB — BASIC METABOLIC PANEL
ANION GAP: 9 (ref 5–15)
BUN: 54 mg/dL — ABNORMAL HIGH (ref 6–20)
CHLORIDE: 91 mmol/L — AB (ref 101–111)
CO2: 38 mmol/L — AB (ref 22–32)
Calcium: 8.7 mg/dL — ABNORMAL LOW (ref 8.9–10.3)
Creatinine, Ser: 1.13 mg/dL (ref 0.61–1.24)
GFR calc non Af Amer: 57 mL/min — ABNORMAL LOW (ref >60)
Glucose, Bld: 282 mg/dL — ABNORMAL HIGH (ref 65–99)
Potassium: 4.3 mmol/L (ref 3.5–5.1)
Sodium: 138 mmol/L (ref 135–145)

## 2015-10-05 LAB — GLUCOSE, CAPILLARY
GLUCOSE-CAPILLARY: 291 mg/dL — AB (ref 65–99)
GLUCOSE-CAPILLARY: 288 mg/dL — AB (ref 65–99)
GLUCOSE-CAPILLARY: 232 mg/dL — AB (ref 65–99)
Glucose-Capillary: 244 mg/dL — ABNORMAL HIGH (ref 65–99)

## 2015-10-05 MED ORDER — DILTIAZEM HCL 100 MG IV SOLR
10.0000 mg | Freq: Once | INTRAVENOUS | Status: AC
  Administered 2015-10-05: 10 mg via INTRAVENOUS
  Filled 2015-10-05: qty 100

## 2015-10-05 MED ORDER — DILTIAZEM HCL ER COATED BEADS 180 MG PO CP24
300.0000 mg | ORAL_CAPSULE | Freq: Every day | ORAL | Status: DC
  Administered 2015-10-05 – 2015-10-06 (×2): 300 mg via ORAL
  Filled 2015-10-05 (×2): qty 1

## 2015-10-05 MED ORDER — DILTIAZEM HCL ER COATED BEADS 180 MG PO CP24: 360.0000 mg | ORAL_CAPSULE | Freq: Every day | ORAL | Status: DC

## 2015-10-05 MED ORDER — ALPRAZOLAM 0.25 MG PO TABS
0.2500 mg | ORAL_TABLET | Freq: Once | ORAL | Status: AC
  Administered 2015-10-05: 0.25 mg via ORAL
  Filled 2015-10-05: qty 1

## 2015-10-05 NOTE — Progress Notes (Signed)
Inpatient Diabetes Program Recommendations  AACE/ADA: New Consensus Statement on Inpatient Glycemic Control (2015)  Target Ranges:  Prepandial:   less than 140 mg/dL      Peak postprandial:   less than 180 mg/dL (1-2 hours)      Critically ill patients:  140 - 180 mg/dL   Results for DANON, LOGRASSO (MRN [id number]) as of 10/05/2015 09:08  Ref. Range 10/04/2015 08:32 10/04/2015 12:19 10/04/2015 16:42 10/04/2015 21:47  Glucose-Capillary Latest Ref Range: 65-99 mg/dL 469 (H) 629 (H) 528 (H) 194 (H)    Results for MONIQUE, GIFT (MRN [id number]) as of 10/05/2015 09:08  Ref. Range 10/05/2015 07:21  Glucose-Capillary Latest Ref Range: 65-99 mg/dL 272 (H)    Admit COPD   History: DM, COPD, CHF  Home DM Meds: Amaryl 2 mg daily  Current Insulin Orders: Novolog Resistant SSI (0-20 units) TID AC + HS     MD- Please consider the following in-hospital insulin adjustments while patient receiving IV steroids:  1. Change Novolog Resistant SSI to Q4 hour coverage  2. Start basal insulin- Lantus 12 units daily (0.15 units/kg dosing)     --Will follow patient during hospitalization--  Ambrose Finland RN, MSN, CDE Diabetes Coordinator Inpatient Glycemic Control Team Team Pager: [phone] (8a-5p)

## 2015-10-05 NOTE — Consult Note (Signed)
   [facility] CM Inpatient Consult   10/05/2015  [name] 05/23/1928 [id number]   Patient screened for [facility] Care Management services. Patient remains in stepdown unit. Will engage if and when appropriate for potential [facility] Care Management program services.  [name], MSN-Ed, RN,BSN [facility] Liaison [phone]

## 2015-10-05 NOTE — Progress Notes (Signed)
PCCM PROGRESS NOTE  ADMISSION DATE: 09/30/2015 CONSULT DATE: 09/30/2015 REFERRING PROVIDER: Dr. Rito Ehrlich  CC: Short of breath  SUBJECTIVE: Still intermittently on BiPAP >> off this AM.  He is not sure if he is getting better, but feels BiPAP helps some.  VITAL SIGNS: BP 116/84 mmHg  Pulse 139  Temp(Src) 96.6 F (35.9 C) (Axillary)  Resp 14  Ht  (1.753 m)  Wt 170 lb 6.7 oz (77.3 kg)  BMI 25.15 kg/m2  SpO2 99%  INTAKE/OUTPUT: I/O last 3 completed shifts: In: 680 [P.O.:630; IV Piggyback:50] Out: 2050 [Urine:2050]  General: sleepy, increased WOB HEENT: no sinus tenderness Cardiac: irregular, tachycardic no murmur Chest: prolonged exhalation, no wheeze Abd: soft, non tender Ext: ankle edema Neuro: follows commands Skin: no rashes   CBC Recent Labs     10/03/15  0333  10/04/15  0357  WBC  18.2*  14.2*  HGB  15.2  15.1  HCT  50.4  49.0  PLT  162  161    BMET Recent Labs     10/03/15  0333  10/04/15  0357  10/05/15  0400  NA  142  141  138  K  4.3  4.3  4.3  CL  99*  98*  91*  CO2  30  34*  38*  BUN  53*  52*  54*  CREATININE  1.45*  1.23  1.13  GLUCOSE  169*  237*  282*    Electrolytes Recent Labs     10/03/15  0333  10/04/15  0357  10/05/15  0400  CALCIUM  9.1  9.4  8.7*    Liver Enzymes No results for input(s): AST, ALT, ALKPHOS, BILITOT, ALBUMIN in the last 72 hours.  Glucose Recent Labs     10/03/15  2149  10/04/15  0832  10/04/15  1219  10/04/15  1642  10/04/15  2147  10/05/15  0721  GLUCAP  294*  292*  309*  228*  194*  244*    Imaging Dg Chest Port 1 View  10/04/2015  CLINICAL DATA:  Respiratory failure. EXAM: PORTABLE CHEST 1 VIEW COMPARISON:  10/02/2015 .  09/30/2015.  03/29/2015.  03/27/2014 . FINDINGS: Mediastinum and hilar structures normal. Cardiac pacer in stable position. Cardiomegaly. Stable right base pleural parenchymal thickening. No new infiltrate. Cardiac pacer with lead tip projected over the right ventricle .  Cardiomegaly with normal pulmonary vascularity. No pleural effusion or pneumothorax . IMPRESSION: 1. Cardiac pacer with lead tip projected over the right ventricle. Stable cardiomegaly. Scratched 2. Right base pleural-parenchymal thickening consistent with scarring. Electronically Signed   By: Maisie Fus  Register   On: 10/04/2015 07:46    CULTURES: 1/14 Influenza >> negative 1/14 Pneumococcal Ag >> Positive 1/15 Sputum >> oral flora  ANTIBIOTICS: 1/14 Zithromax >> 1/16 1/16 Rocephin >>  STUDIES:  EVENTS: 1/14 Admit 1/16 Obtunded, stuck on BiPAP 1/17 Mental status improved 1/18 Back on BiPAP; cardiology consulted for a fib  DISCUSSION: 82 yo male former smoker with cough, dyspnea, wheeze, hypoxia from AECOPD and Pneumococcal tracheobronchitis.  He has hx of advanced COPD on chronic prednisone, chronic systolic/diastolic CHF with EF 35 to 40% from Oct 2014, Chronic a fib on eliquis, CAD, HTN, HLD, s/p PM, GERD, DM.  He is DNR/DNI.  ASSESSMENT/PLAN:  Acute hypoxic/hypercapnic respiratory failure. Plan: - oxygen to keep SpO2 88 to 95% - Continue BiPAP qhs and prn - prn morphine for dyspnea  AECOPD. Pneumococcal tracheobronchitis. Plan: - pulmicort, albuterol, ipratropium - continue solumedrol 40 mg  IV q6h - Day 6 of Abx, changed to rocephin - f/u CXR intermittently  Chronic combined CHF. Hx of A fib, CAD, HTN, HLD. Plan: - per primary team and cardiology  Acute encephalopathy likely 2nd to hypercapnia. Plan: - monitor mental status  AKI. Plan: - monitor renal fx while getting lasix  Goals of Care >> DNR/DNI.  Agree with consideration for palliative care consultation.  D/w Dr. Robb Matar.   Coralyn Helling, MD [facility] Pulmonary/Critical Care 10/05/2015, 8:58 AM Pager:  [phone] After 3pm call: [phone]

## 2015-10-05 NOTE — Progress Notes (Signed)
[facility] PROGRESS NOTE    Progress Note   [name] ZOX:[id number] DOB: 05/01/1928 DOA: 09/30/2015 PCP: Martha Clan, MD   Brief Narrative:   [name] is an 82 y.o. male With past medical history of advanced COPD on home oxygen, diabetes mellitus, CAD atrial fibrillation on apixiban chronic systolic and diastolic heart failure comes to the ED complaining of cough and shortness of breath. She was found to be in acute COPD exacerbation placed on BiPAP and admitted to the hospital. She was started on IV Solu-Medrol steroids and PCCM was consulted. Patient  Cannot tolerate BiPAP when necessary.  Assessment/Plan:   Acute-on-chronic respiratory failure (HCC) Due to acute COPD exacerbation/acute encephalopathy: PCCM was consulted and recommended to continue BiPAP and IV morphine. He agreed to use bipap overnight Cont IV Solu-Medrol steroids and inhalers. Breathing greater than 25 times per minute using  Accessory muscles to breathe. Unchanged lung exam, cont to have work of breathing. Given morphine and will see if he can tolerate BiPAP. I d/w the patient again he would like to cont aggressive treatment. Not improving prognosis is guarded.  Chronic, systolic and diastolic heart failure: Seems to be euvolemic, continue current regimen.  CAD: Asymptomatic continue medical management.  Atrial Fibrillation with RVR CHA2DS2-VASc Score is 6. Patient is on Eliquis. Cardiology on board.  Controlled diabetes mellitus type 2: A1c was 6.7.  Acute confusional state: Cont Haldol when necessary when necessary.  BPH: Continue Flomax and Proscar  Malnutrition of moderate degree     DVT Prophylaxis - Lovenox ordered.  Family Communication: daughter Disposition Plan: keep in SDU Code Status:     Code Status Orders        Start     Ordered   09/30/15 0534  Do not attempt resuscitation (DNR)   Continuous    Question Answer Comment  In the event of cardiac or  respiratory ARREST Do not call a "code blue"   In the event of cardiac or respiratory ARREST Do not perform Intubation, CPR, defibrillation or ACLS   In the event of cardiac or respiratory ARREST Use medication by any route, position, wound care, and other measures to relive pain and suffering. May use oxygen, suction and manual treatment of airway obstruction as needed for comfort.      09/30/15 0534    Code Status History    Date Active Date Inactive Code Status Order ID Comments User Context   03/27/2014 10:53 PM 03/29/2014  2:51 PM DNR [id number]  Alysia Penna, MD Inpatient   12/31/2013  5:02 PM 01/06/2014  6:19 PM DNR [id number]  Marrian Salvage, MD ED   09/09/2013  4:03 PM 09/17/2013  2:54 PM DNR [id number]  Hoyle Sauer, MD Inpatient   07/09/2013  7:51 PM 07/13/2013  1:34 PM DNR [id number]  Jarome Matin, MD Inpatient   07/09/2013  1:18 PM 07/09/2013  7:51 PM Full Code [id number]  Jarome Matin, MD ED   09/28/2012  7:29 AM 10/01/2012  6:12 PM DNR [id number]  Kari Baars, MD Inpatient   04/04/2012  1:02 PM 04/14/2012  2:29 PM DNR [id number]  Jarome Matin, MD ED   04/04/2012 12:57 PM 04/04/2012  1:02 PM DNR [id number]  Jarome Matin, MD ED   03/10/2012  5:58 PM 03/19/2012  4:40 PM DNR [id number]  Tammy Sours, RN Inpatient   11/13/2011 11:11 AM 11/15/2011  8:15 PM DNR [id number]  Lorinda Creed, NP Inpatient   11/08/2011  3:03 PM 11/13/2011 11:10 AM Partial Code  [id number]  Kalman Shan, MD Inpatient   11/07/2011  4:13 PM 11/08/2011  3:03 PM Full Code [id number]  Theora Gianotti, RN Inpatient    Advance Directive Documentation        Most Recent Value   Type of Advance Directive  Healthcare Power of Attorney, Living will   Pre-existing out of facility DNR order (yellow form or pink MOST form)     "MOST" Form in Place?          IV Access:    Peripheral IV   Procedures and diagnostic studies:   Dg Chest Port 1 View  2015-10-25  CLINICAL DATA:  Respiratory failure. EXAM: PORTABLE CHEST 1  VIEW COMPARISON:  10/02/2015 .  09/30/2015.  03/29/2015.  03/27/2014 . FINDINGS: Mediastinum and hilar structures normal. Cardiac pacer in stable position. Cardiomegaly. Stable right base pleural parenchymal thickening. No new infiltrate. Cardiac pacer with lead tip projected over the right ventricle . Cardiomegaly with normal pulmonary vascularity. No pleural effusion or pneumothorax . IMPRESSION: 1. Cardiac pacer with lead tip projected over the right ventricle. Stable cardiomegaly. Scratched 2. Right base pleural-parenchymal thickening consistent with scarring. Electronically Signed   By: Maisie Fus  Register   On: 2015-10-25 07:46     Medical Consultants:    None.  Anti-Infectives:   Anti-infectives    Start     Dose/Rate Route Frequency Ordered Stop   10/02/15 1000  cefTRIAXone (ROCEPHIN) 1 g in dextrose 5 % 50 mL IVPB     1 g 100 mL/hr over 30 Minutes Intravenous Every 24 hours 10/02/15 0912     10/01/15 1000  azithromycin (ZITHROMAX) tablet 250 mg  Status:  Discontinued     250 mg Oral Daily 09/30/15 0530 10/02/15 0920   09/30/15 1000  azithromycin (ZITHROMAX) tablet 500 mg     500 mg Oral Daily 09/30/15 0530 09/30/15 0944      Subjective:    [name]  HE would like to cont agressive treatment.  Objective:    Filed Vitals:   10/05/15 0337 10/05/15 0400 10/05/15 0500 10/05/15 0600  BP:  126/62 117/68 117/68  Pulse:  39 67 57  Temp:  97.6 F (36.4 C)    TempSrc:  Axillary    Resp:  Height:      Weight:      SpO2: 99% 100% 99% 97%    Intake/Output Summary (Last 24 hours) at 10/05/15 0727 Last data filed at 10/05/15 0600  Gross per 24 hour  Intake    680 ml  Output   1625 ml  Net   -945 ml   Filed Weights   10/03/15 0425 10/25/2015 0338 10/05/15 0322  Weight: 80.7 kg (177 lb 14.6 oz) 79.3 kg (174 lb 13.2 oz) 77.3 kg (170 lb 6.7 oz)    Exam: Gen:  Awake alert and oriented 3 Cardiovascular:  RRR. Chest and lungs:   Poor air movement with  wheezing bilaterally. Using external muscles to breathe. Abdomen:  Abdomen soft, NT/ND, + BS Extremities:  No edema.   Data Reviewed:    Labs: Basic Metabolic Panel:  Recent Labs Lab 09/30/15 0300 10/01/15 0424 10/03/15 0333 10/25/2015 0357 10/05/15 0400  NA 138 139 142 141 138  K 4.1 4.5 4.3 4.3 4.3  CL 101 102 99* 98* 91*  CO2 34* 38*  GLUCOSE 124* 154* 169* 237* 282*  BUN 29* 27* 53* 52* 54*  CREATININE 1.10 1.01 1.45* 1.23 1.13  CALCIUM 8.8*  8.8* 9.1 9.4 8.7*   GFR Estimated Creatinine Clearance: 47.8 mL/min (by C-G formula based on Cr of 1.13). Liver Function Tests:  Recent Labs Lab 10/01/15 0424  AST 29  ALT 30  ALKPHOS 86  BILITOT 1.0  PROT 6.3*  ALBUMIN 3.9   No results for input(s): LIPASE, AMYLASE in the last 168 hours. No results for input(s): AMMONIA in the last 168 hours. Coagulation profile  Recent Labs Lab 09/30/15 0317  INR 1.45    CBC:  Recent Labs Lab 09/30/15 0300 10/01/15 0424 10/03/15 0333 10/04/15 0357  WBC 10.8* 10.3 18.2* 14.2*  NEUTROABS 7.6  --   --   --   HGB 14.6 14.1 15.2 15.1  HCT 46.8 45.5 50.4 49.0  MCV 97.9 98.5 99.8 98.4  PLT 148* 114* 162 161   Cardiac Enzymes: No results for input(s): CKTOTAL, CKMB, CKMBINDEX, TROPONINI in the last 168 hours. BNP (last 3 results) No results for input(s): PROBNP in the last 8760 hours. CBG:  Recent Labs Lab 10/03/15 2149 10/04/15 0832 10/04/15 1219 10/04/15 1642 10/04/15 2147  GLUCAP 294* 292* 309* 228* 194*   D-Dimer: No results for input(s): DDIMER in the last 72 hours. Hgb A1c: No results for input(s): HGBA1C in the last 72 hours. Lipid Profile: No results for input(s): CHOL, HDL, LDLCALC, TRIG, CHOLHDL, LDLDIRECT in the last 72 hours. Thyroid function studies: No results for input(s): TSH, T4TOTAL, T3FREE, THYROIDAB in the last 72 hours.  Invalid input(s): FREET3 Anemia work up: No results for input(s): VITAMINB12, FOLATE, FERRITIN, TIBC, IRON,  RETICCTPCT in the last 72 hours. Sepsis Labs:  Recent Labs Lab 09/30/15 0300 10/01/15 0424 10/03/15 0333 10/04/15 0357  WBC 10.8* 10.3 18.2* 14.2*   Microbiology Recent Results (from the past 240 hour(s))  MRSA PCR Screening     Status: None   Collection Time: 09/30/15  9:40 PM  Result Value Ref Range Status   MRSA by PCR NEGATIVE NEGATIVE Final    Comment:        The GeneXpert MRSA Assay (FDA approved for NASAL specimens only), is one component of a comprehensive MRSA colonization surveillance program. It is not intended to diagnose MRSA infection nor to guide or monitor treatment for MRSA infections.   Culture, respiratory (NON-Expectorated)     Status: None   Collection Time: 10/01/15  1:30 PM  Result Value Ref Range Status   Specimen Description SPUTUM  Final   Special Requests NONE  Final   Gram Stain   Final    ABUNDANT WBC PRESENT,BOTH PMN AND MONONUCLEAR NO SQUAMOUS EPITHELIAL CELLS SEEN FEW GRAM POSITIVE COCCI IN CLUSTERS Performed at [facility]    Culture   Final    NORMAL OROPHARYNGEAL FLORA Performed at [facility]    Report Status 10/03/2015 FINAL  Final  Culture, expectorated sputum-assessment     Status: None   Collection Time: 10/01/15  1:32 PM  Result Value Ref Range Status   Specimen Description SPUTUM  Final   Special Requests NONE  Final   Sputum evaluation   Final    THIS SPECIMEN IS ACCEPTABLE. RESPIRATORY CULTURE REPORT TO FOLLOW.   Report Status 10/01/2015 FINAL  Final     Medications:   . antiseptic oral rinse  7 mL Mouth Rinse q12n4p  . apixaban  5 mg Oral BID  . bisoprolol  10 mg Oral Daily  . budesonide (PULMICORT) nebulizer solution  0.5 mg Nebulization BID  . cefTRIAXone (ROCEPHIN)  IV  1 g Intravenous  Q24H  . chlorhexidine  15 mL Mouth Rinse BID  . digoxin  0.125 mg Oral Daily  . diltiazem  240 mg Oral Daily  . finasteride  5 mg Oral Daily  . insulin aspart  0-20 Units Subcutaneous TID WC  . insulin  aspart  0-5 Units Subcutaneous QHS  . ipratropium  0.5 mg Nebulization Q4H  . levalbuterol  0.63 mg Nebulization 6 times per day  . methylPREDNISolone (SOLU-MEDROL) injection  60 mg Intravenous Q6H  . rosuvastatin  40 mg Oral Daily  . sodium chloride  3 mL Intravenous Q12H  . tamsulosin  0.4 mg Oral BID   Continuous Infusions:   Time spent: 35 min   LOS: 5 days   Marinda Elk  [facility] Pager [phone]  *Please refer to [email or web address], password [email or web address] to get updated schedule on who will round on this patient, as hospitalists switch teams weekly. If 7PM-7AM, please contact night-coverage at [email or web address], password [email or web address] for any overnight needs.  10/05/2015, 7:27 AM

## 2015-10-05 NOTE — Progress Notes (Signed)
Pt. Profile: 82 year old male with hx of COPD, chronic a fib, v. Tach, CAD (STEMI in 2011 and 07/2011 cath with PCI with DES to LCX for total occl. Chronic total occl of RCA with Lt->rt collaterals, nonobstructive LAD stenosis) Hx of ICM -CHF with EF 25% 2012 and hx. symptomatic brady requiring pacemaker-2012 [employer] device. EF 35-40% on last echo 06/2013, moderate MR. L atrium and R atrium moderately dilated now with a fib with RVR.  Now with COPD exacerbation and acute on chronic respiratory failure.  His chronic a fib with RVR.  Subjective: Off of BiPAP currently no chest pain, not aware that his heart is racing.   Objective: Vital signs in last 24 hours: Temp:  [97.3 F (36.3 C)-97.8 F (36.6 C)] 97.6 F (36.4 C) (01/19 0400) Pulse Rate:  [31-131] 46 (01/19 0800) Resp:  [12-25] 17 (01/19 0800) BP: (82-133)/(46-101) 116/84 mmHg (01/19 0800) SpO2:  [97 %-100 %] 100 % (01/19 0800) FiO2 (%):  [30 %] 30 % (01/18 0929) Weight:  [170 lb 6.7 oz (77.3 kg)] 170 lb 6.7 oz (77.3 kg) (01/19 0322) Weight change: -4 lb 6.6 oz (-2 kg) Last BM Date: 09/29/15 Intake/Output from previous day: -945 ( since admit -3452) 01/18 0701 - 01/19 0700 In: 680 [P.O.:630; IV Piggyback:50] Out: 1625 [Urine:1625] Intake/Output this shift:    PE: General:Pleasant affect, fatigued, resting   Skin:Warm and dry, brisk capillary refill HEENT:normocephalic, sclera clear, mucus membranes moist Neck:supple, +JVD  Heart:irreg irreg rapid without murmur, gallup, rub or click Lungs without rales, +rhonchi, or wheezes- not moving much air.  OZH:YQMV, non tender, + BS, do not palpate liver spleen or masses Ext:no lower ext edema, 2+ pedal pulses, 2+ radial pulses Neuro:wakes after calling name several times, MAE, follows commands, + facial symmetry Tele:  A fib with RVR 118-140s mostly in 130s  Lab Results:  Recent Labs  10/03/15 0333 10/04/15 0357  WBC 18.2* 14.2*  HGB 15.2 15.1  HCT 50.4 49.0   PLT 162 161   BMET  Recent Labs  10/04/15 0357 10/05/15 0400  NA 141 138  K 4.3 4.3  CL 98* 91*  CO2 34* 38*  GLUCOSE 237* 282*  BUN 52* 54*  CREATININE 1.23 1.13  CALCIUM 9.4 8.7*   No results for input(s): TROPONINI in the last 72 hours.  Invalid input(s): CK, MB  Lab Results  Component Value Date   CHOL 135 03/11/2012   HDL 32* 03/11/2012   LDLCALC 66 03/11/2012   TRIG 187* 03/11/2012   CHOLHDL 4.2 03/11/2012   Lab Results  Component Value Date   HGBA1C 6.7* 09/30/2015     Lab Results  Component Value Date   TSH 0.601 12/31/2013        Studies/Results: Dg Chest Port 1 View  10/04/2015  CLINICAL DATA:  Respiratory failure. EXAM: PORTABLE CHEST 1 VIEW COMPARISON:  10/02/2015 .  09/30/2015.  03/29/2015.  03/27/2014 . FINDINGS: Mediastinum and hilar structures normal. Cardiac pacer in stable position. Cardiomegaly. Stable right base pleural parenchymal thickening. No new infiltrate. Cardiac pacer with lead tip projected over the right ventricle . Cardiomegaly with normal pulmonary vascularity. No pleural effusion or pneumothorax . IMPRESSION: 1. Cardiac pacer with lead tip projected over the right ventricle. Stable cardiomegaly. Scratched 2. Right base pleural-parenchymal thickening consistent with scarring. Electronically Signed   By: Maisie Fus  Register   On: 10/04/2015 07:46    Medications: I have reviewed the patient's current medications. Scheduled Meds: .  antiseptic oral rinse  7 mL Mouth Rinse q12n4p  . apixaban  5 mg Oral BID  . bisoprolol  10 mg Oral Daily  . budesonide (PULMICORT) nebulizer solution  0.5 mg Nebulization BID  . cefTRIAXone (ROCEPHIN)  IV  1 g Intravenous Q24H  . chlorhexidine  15 mL Mouth Rinse BID  . digoxin  0.125 mg Oral Daily  . diltiazem  300 mg Oral Daily  . finasteride  5 mg Oral Daily  . insulin aspart  0-20 Units Subcutaneous TID WC  . insulin aspart  0-5 Units Subcutaneous QHS  . ipratropium  0.5 mg Nebulization Q4H  .  levalbuterol  0.63 mg Nebulization 6 times per day  . methylPREDNISolone (SOLU-MEDROL) injection  60 mg Intravenous Q6H  . rosuvastatin  40 mg Oral Daily  . sodium chloride  3 mL Intravenous Q12H  . tamsulosin  0.4 mg Oral BID   Continuous Infusions:  PRN Meds:.acetaminophen **OR** acetaminophen, fluticasone, haloperidol lactate, metoprolol, morphine injection, nitroGLYCERIN, [DISCONTINUED] ondansetron **OR** ondansetron (ZOFRAN) IV  Assessment/Plan: Principal Problem:   Acute-on-chronic respiratory failure (HCC) Active Problems:   Abdominal aortic aneurysm (HCC)   Emphysema/COPD (HCC)   Pacemaker-[employer].Jude   CAD (coronary artery disease)   HLD (hyperlipidemia)   COPD exacerbation (HCC)   Permanent atrial fibrillation (HCC)   Depression   Chronic combined systolic and diastolic congestive heart failure (HCC)   COPD, severe (HCC)   GERD (gastroesophageal reflux disease)   BPH (benign prostatic hyperplasia)   Malnutrition of moderate degree  Atrial fib with RVR HR on his normal meds uncontrolled -were going to increase dilt po to 360 but overnight HR soft to 85 systolic so have increased only to dilt 300 mg.  Also with IV lopressor 5 mg every 4 hours prn for HR > 115.  But yesterday it did not change HR much.  Dr. Antoine Poche to see again today.   Respiratory status is driving HR as well.  Pt not aware of racing HR.  -on Eliquis CHA2DS2-VASc Score is 6  CAD with hx MI neg troponin, no chest pain.  COPD per IM/Pulmonary- with bipap/solumedrol  Planning paillative care consult   LOS: 5 days   Time spent with pt. : 15 minutes. [facility] R  Nurse Practitioner Certified Pager [phone] or after 5pm and on weekends call [phone] 10/05/2015, 8:06 AM  History and all data above reviewed.  Patient examined.  I agree with the findings as above. Somonlent but wakes up and denies any acute complaints.  Thinks his breathing is "a little bit better."    The patient exam reveals AVW:[id number]   ,  Lungs: Decreased breath sounds  ,  Abd: Positive bowel sounds, no rebound no guarding, Ext No edema   .  All available labs, radiology testing, previous records reviewed. Agree with documented assessment and plan. Atrial fib with RVR.  Rate driven by respiratory status and overall poor condition.  If increasing the calcium channel blocker is not tolerated or does not help we could consider PO amiodarone long term.    Fayrene Fearing Leveta Wahab  12:09 PM  10/05/2015

## 2015-10-05 NOTE — Progress Notes (Signed)
Patient has not tolerated BiPAP well at night over the past few nights and declines the use for tonight. RT will continue to follow.

## 2015-10-06 LAB — GLUCOSE, CAPILLARY
GLUCOSE-CAPILLARY: 189 mg/dL — AB (ref 65–99)
GLUCOSE-CAPILLARY: 260 mg/dL — AB (ref 65–99)
Glucose-Capillary: 303 mg/dL — ABNORMAL HIGH (ref 65–99)
Glucose-Capillary: 307 mg/dL — ABNORMAL HIGH (ref 65–99)
Glucose-Capillary: 262 mg/dL — ABNORMAL HIGH (ref 65–99)

## 2015-10-06 LAB — BASIC METABOLIC PANEL
Anion gap: 10 (ref 5–15)
BUN: 43 mg/dL — AB (ref 6–20)
CO2: 35 mmol/L — ABNORMAL HIGH (ref 22–32)
CREATININE: 0.98 mg/dL (ref 0.61–1.24)
Calcium: 8.7 mg/dL — ABNORMAL LOW (ref 8.9–10.3)
Chloride: 90 mmol/L — ABNORMAL LOW (ref 101–111)
GFR calc Af Amer: >60 mL/min (ref >60)
Glucose, Bld: 211 mg/dL — ABNORMAL HIGH (ref 65–99)
Potassium: 4.4 mmol/L (ref 3.5–5.1)
SODIUM: 135 mmol/L (ref 135–145)

## 2015-10-06 MED ORDER — INSULIN GLARGINE 100 UNIT/ML ~~LOC~~ SOLN
5.0000 [IU] | Freq: Two times a day (BID) | SUBCUTANEOUS | Status: DC
  Administered 2015-10-06 (×2): 5 [IU] via SUBCUTANEOUS
  Filled 2015-10-06 (×3): qty 0.05

## 2015-10-06 MED ORDER — ENSURE ENLIVE PO LIQD
237.0000 mL | Freq: Two times a day (BID) | ORAL | Status: DC
  Administered 2015-10-06 – 2015-10-17 (×9): 237 mL via ORAL

## 2015-10-06 NOTE — Progress Notes (Signed)
[facility] PROGRESS NOTE    Progress Note   [name] ZOX:[id number] DOB: Jan 09, 1928 DOA: 09/30/2015 PCP: Martha Clan, MD   Brief Narrative:   [name] is an 82 y.o. male With past medical history of advanced COPD on home oxygen, diabetes mellitus, CAD atrial fibrillation on apixiban chronic systolic and diastolic heart failure comes to the ED complaining of cough and shortness of breath. She was found to be in acute COPD exacerbation placed on BiPAP and admitted to the hospital. She was started on IV Solu-Medrol steroids and PCCM was consulted. Patient  Cannot tolerate BiPAP when necessary.  Assessment/Plan:   Acute-on-chronic respiratory failure (HCC) Due to acute COPD exacerbation/acute encephalopathy: PCCM was consulted and recommended to continue BiPAP and IV morphine.  Cont IV Solu-Medrol steroids and inhalers. Cont morphine PRN I d/w the patient again he would like to cont aggressive treatment.  Chronic, systolic and diastolic heart failure: Seems to be euvolemic, continue current regimen.  CAD: Asymptomatic continue medical management.  Atrial Fibrillation with RVR CHA2DS2-VASc Score is 6. Patient is on Eliquis. Cardiology on board, appreciate assistance  Controlled diabetes mellitus type 2: A1c was 6.7.  Acute confusional state: Cont Haldol when necessary when necessary.  BPH: Continue Flomax and Proscar  Malnutrition of moderate degree     DVT Prophylaxis - Lovenox ordered.  Family Communication: daughter Disposition Plan: keep in SDU Code Status:     Code Status Orders        Start     Ordered   09/30/15 0534  Do not attempt resuscitation (DNR)   Continuous    Question Answer Comment  In the event of cardiac or respiratory ARREST Do not call a "code blue"   In the event of cardiac or respiratory ARREST Do not perform Intubation, CPR, defibrillation or ACLS   In the event of cardiac or respiratory ARREST Use medication by any  route, position, wound care, and other measures to relive pain and suffering. May use oxygen, suction and manual treatment of airway obstruction as needed for comfort.      09/30/15 0534    Code Status History    Date Active Date Inactive Code Status Order ID Comments User Context   03/27/2014 10:53 PM 03/29/2014  2:51 PM DNR [id number]  Alysia Penna, MD Inpatient   12/31/2013  5:02 PM 01/06/2014  6:19 PM DNR [id number]  Marrian Salvage, MD ED   09/09/2013  4:03 PM 09/17/2013  2:54 PM DNR [id number]  Hoyle Sauer, MD Inpatient   07/09/2013  7:51 PM 07/13/2013  1:34 PM DNR [id number]  Jarome Matin, MD Inpatient   07/09/2013  1:18 PM 07/09/2013  7:51 PM Full Code [id number]  Jarome Matin, MD ED   09/28/2012  7:29 AM 10/01/2012  6:12 PM DNR [id number]  Kari Baars, MD Inpatient   04/04/2012  1:02 PM 04/14/2012  2:29 PM DNR [id number]  Jarome Matin, MD ED   04/04/2012 12:57 PM 04/04/2012  1:02 PM DNR [id number]  Jarome Matin, MD ED   03/10/2012  5:58 PM 03/19/2012  4:40 PM DNR [id number]  Tammy Sours, RN Inpatient   11/13/2011 11:11 AM 11/15/2011  8:15 PM DNR [id number]  Lorinda Creed, NP Inpatient   11/08/2011  3:03 PM 11/13/2011 11:10 AM Partial Code [id number]  Kalman Shan, MD Inpatient   11/07/2011  4:13 PM 11/08/2011  3:03 PM Full Code [id number]  Theora Gianotti, RN Inpatient    Advance Directive Documentation  Most Recent Value   Type of Advance Directive  Healthcare Power of Attorney, Living will   Pre-existing out of facility DNR order (yellow form or pink MOST form)     "MOST" Form in Place?          IV Access:    Peripheral IV   Procedures and diagnostic studies:   No results found.   Medical Consultants:    None.  Anti-Infectives:   Anti-infectives    Start     Dose/Rate Route Frequency Ordered Stop   10/02/15 1000  cefTRIAXone (ROCEPHIN) 1 g in dextrose 5 % 50 mL IVPB     1 g 100 mL/hr over 30 Minutes Intravenous Every 24 hours 10/02/15 0912     10/01/15 1000   azithromycin (ZITHROMAX) tablet 250 mg  Status:  Discontinued     250 mg Oral Daily 09/30/15 0530 10/02/15 0920   09/30/15 1000  azithromycin (ZITHROMAX) tablet 500 mg     500 mg Oral Daily 09/30/15 0530 09/30/15 0944      Subjective:    [name]  He would like to cont agressive treatment.  Objective:    Filed Vitals:   10/06/15 0400 10/06/15 0500 10/06/15 0600 10/06/15 0700  BP: 124/100     Pulse: 75 112 55 48  Temp: 97.7 F (36.5 C)     TempSrc: Axillary     Resp: Height:      Weight:  77.6 kg (171 lb 1.2 oz)    SpO2: 98% 93% 99% 100%    Intake/Output Summary (Last 24 hours) at 10/06/15 0809 Last data filed at 10/06/15 0500  Gross per 24 hour  Intake    750 ml  Output    750 ml  Net      0 ml   Filed Weights   10/04/15 0338 10/05/15 0322 10/06/15 0500  Weight: 79.3 kg (174 lb 13.2 oz) 77.3 kg (170 lb 6.7 oz) 77.6 kg (171 lb 1.2 oz)    Exam: Gen:  Awake alert and oriented 3 Cardiovascular:  RRR. Chest and lungs:   Moderate air movement with wheezing bilaterally. Using external muscles to breathe. Abdomen:  Abdomen soft, NT/ND, + BS Extremities:  No edema.   Data Reviewed:    Labs: Basic Metabolic Panel:  Recent Labs Lab 09/30/15 0300 10/01/15 0424 10/03/15 0333 10/04/15 0357 10/05/15 0400  NA 138 139 142 141 138  K 4.1 4.5 4.3 4.3 4.3  CL 101 102 99* 98* 91*  CO2 34* 38*  GLUCOSE 124* 154* 169* 237* 282*  BUN 29* 27* 53* 52* 54*  CREATININE 1.10 1.01 1.45* 1.23 1.13  CALCIUM 8.8* 8.8* 9.1 9.4 8.7*   GFR Estimated Creatinine Clearance: 47.8 mL/min (by C-G formula based on Cr of 1.13). Liver Function Tests:  Recent Labs Lab 10/01/15 0424  AST 29  ALT 30  ALKPHOS 86  BILITOT 1.0  PROT 6.3*  ALBUMIN 3.9   No results for input(s): LIPASE, AMYLASE in the last 168 hours. No results for input(s): AMMONIA in the last 168 hours. Coagulation profile  Recent Labs Lab 09/30/15 0317  INR 1.45     CBC:  Recent Labs Lab 09/30/15 0300 10/01/15 0424 10/03/15 0333 10/04/15 0357  WBC 10.8* 10.3 18.2* 14.2*  NEUTROABS 7.6  --   --   --   HGB 14.6 14.1 15.2 15.1  HCT 46.8 45.5 50.4 49.0  MCV 97.9 98.5 99.8 98.4  PLT 148*  114* 162 161   Cardiac Enzymes: No results for input(s): CKTOTAL, CKMB, CKMBINDEX, TROPONINI in the last 168 hours. BNP (last 3 results) No results for input(s): PROBNP in the last 8760 hours. CBG:  Recent Labs Lab 10/04/15 2147 10/05/15 0721 10/05/15 1139 10/05/15 1537 10/05/15 2225  GLUCAP 194* 244* 291* 288* 232*   D-Dimer: No results for input(s): DDIMER in the last 72 hours. Hgb A1c: No results for input(s): HGBA1C in the last 72 hours. Lipid Profile: No results for input(s): CHOL, HDL, LDLCALC, TRIG, CHOLHDL, LDLDIRECT in the last 72 hours. Thyroid function studies: No results for input(s): TSH, T4TOTAL, T3FREE, THYROIDAB in the last 72 hours.  Invalid input(s): FREET3 Anemia work up: No results for input(s): VITAMINB12, FOLATE, FERRITIN, TIBC, IRON, RETICCTPCT in the last 72 hours. Sepsis Labs:  Recent Labs Lab 09/30/15 0300 10/01/15 0424 10/03/15 0333 10/04/15 0357  WBC 10.8* 10.3 18.2* 14.2*   Microbiology Recent Results (from the past 240 hour(s))  MRSA PCR Screening     Status: None   Collection Time: 09/30/15  9:40 PM  Result Value Ref Range Status   MRSA by PCR NEGATIVE NEGATIVE Final    Comment:        The GeneXpert MRSA Assay (FDA approved for NASAL specimens only), is one component of a comprehensive MRSA colonization surveillance program. It is not intended to diagnose MRSA infection nor to guide or monitor treatment for MRSA infections.   Culture, respiratory (NON-Expectorated)     Status: None   Collection Time: 10/01/15  1:30 PM  Result Value Ref Range Status   Specimen Description SPUTUM  Final   Special Requests NONE  Final   Gram Stain   Final    ABUNDANT WBC PRESENT,BOTH PMN AND MONONUCLEAR NO  SQUAMOUS EPITHELIAL CELLS SEEN FEW GRAM POSITIVE COCCI IN CLUSTERS Performed at [facility]    Culture   Final    NORMAL OROPHARYNGEAL FLORA Performed at [facility]    Report Status 10/03/2015 FINAL  Final  Culture, expectorated sputum-assessment     Status: None   Collection Time: 10/01/15  1:32 PM  Result Value Ref Range Status   Specimen Description SPUTUM  Final   Special Requests NONE  Final   Sputum evaluation   Final    THIS SPECIMEN IS ACCEPTABLE. RESPIRATORY CULTURE REPORT TO FOLLOW.   Report Status 10/01/2015 FINAL  Final     Medications:   . antiseptic oral rinse  7 mL Mouth Rinse q12n4p  . apixaban  5 mg Oral BID  . bisoprolol  10 mg Oral Daily  . budesonide (PULMICORT) nebulizer solution  0.5 mg Nebulization BID  . cefTRIAXone (ROCEPHIN)  IV  1 g Intravenous Q24H  . chlorhexidine  15 mL Mouth Rinse BID  . digoxin  0.125 mg Oral Daily  . diltiazem  300 mg Oral Daily  . finasteride  5 mg Oral Daily  . insulin aspart  0-20 Units Subcutaneous TID WC  . insulin aspart  0-5 Units Subcutaneous QHS  . insulin glargine  5 Units Subcutaneous BID  . ipratropium  0.5 mg Nebulization Q4H  . levalbuterol  0.63 mg Nebulization 6 times per day  . methylPREDNISolone (SOLU-MEDROL) injection  60 mg Intravenous Q6H  . rosuvastatin  40 mg Oral Daily  . sodium chloride  3 mL Intravenous Q12H  . tamsulosin  0.4 mg Oral BID   Continuous Infusions:   Time spent: 25 min   LOS: 6 days   FELIZ ORTIZ, Darin Engels  [facility] Pager [phone]  *Please refer to [email or web address], password [email or web address] to get updated schedule on who will round on this patient, as hospitalists switch teams weekly. If 7PM-7AM, please contact night-coverage at [email or web address], password [email or web address] for any overnight needs.  10/06/2015, 8:09 AM

## 2015-10-06 NOTE — Progress Notes (Signed)
Pt refused BiPAP qhs.  Has not tolerated well in the past.  RT will continue to monitor.  RN aware.

## 2015-10-06 NOTE — Progress Notes (Signed)
Pt. Profile: 82 year old male with hx of COPD, chronic a fib, v. Tach, CAD (STEMI in 2011 and 07/2011 cath with PCI with DES to LCX for total occl. Chronic total occl of RCA with Lt->rt collaterals, nonobstructive LAD stenosis) Hx of ICM -CHF with EF 25% 2012 and hx. symptomatic brady requiring pacemaker-2012 St. Jude device. EF 35-40% on last echo 06/2013, moderate MR. L atrium and R atrium moderately dilated now with a fib with RVR. Now with COPD exacerbation and acute on chronic respiratory failure. His chronic a fib with RVR.   Subjective: No chest pain, breathing better  Objective: Vital signs in last 24 hours: Temp:  [96.9 F (36.1 C)-97.7 F (36.5 C)] 97.7 F (36.5 C) (01/20 0400) Pulse Rate:  [35-139] 48 (01/20 0700) Resp:  [12-29] 20 (01/20 0700) BP: (106-152)/(64-116) 124/100 mmHg (01/20 0400) SpO2:  [87 %-100 %] 100 % (01/20 0700) Weight:  [171 lb 1.2 oz (77.6 kg)] 171 lb 1.2 oz (77.6 kg) (01/20 0500) Weight change: 10.6 oz (0.3 kg) Last BM Date: 09/29/15 Intake/Output from previous day:  Even blalance 01/19 0701 - 01/20 0700 In: 750 [P.O.:700; IV Piggyback:50] Out: 750 [Urine:750] Intake/Output this shift:    PE: General:Pleasant affect, NAD- resting easier Skin:Warm and dry, brisk capillary refill HEENT:normocephalic, sclera clear, mucus membranes moist  Heart:irreg irreg without murmur, gallup, rub or click Lungs:without rales, rhonchi, + wheezes some increased air movement ONG:EXBM, non tender, + BS, do not palpate liver spleen or masses- no BM since admit Ext:no lower ext edema, 2+ pedal pulses, 2+ radial pulses Neuro:alert and oriented X 3, MAE, follows commands, + facial symmetry Tele:  A fib RVR rate somewhat improved   Lab Results:  Recent Labs  10/04/15 0357  WBC 14.2*  HGB 15.1  HCT 49.0  PLT 161   BMET  Recent Labs  10/04/15 0357 10/05/15 0400  NA 141 138  K 4.3 4.3  CL 98* 91*  CO2 34* 38*  GLUCOSE 237* 282*  BUN 52*  54*  CREATININE 1.23 1.13  CALCIUM 9.4 8.7*   No results for input(s): TROPONINI in the last 72 hours.  Invalid input(s): CK, MB  Lab Results  Component Value Date   CHOL 135 03/11/2012   HDL 32* 03/11/2012   LDLCALC 66 03/11/2012   TRIG 187* 03/11/2012   CHOLHDL 4.2 03/11/2012   Lab Results  Component Value Date   HGBA1C 6.7* 09/30/2015     Lab Results  Component Value Date   TSH 0.601 12/31/2013       Studies/Results: No results found.  Medications: I have reviewed the patient's current medications. Scheduled Meds: . antiseptic oral rinse  7 mL Mouth Rinse q12n4p  . apixaban  5 mg Oral BID  . bisoprolol  10 mg Oral Daily  . budesonide (PULMICORT) nebulizer solution  0.5 mg Nebulization BID  . cefTRIAXone (ROCEPHIN)  IV  1 g Intravenous Q24H  . chlorhexidine  15 mL Mouth Rinse BID  . digoxin  0.125 mg Oral Daily  . diltiazem  300 mg Oral Daily  . finasteride  5 mg Oral Daily  . insulin aspart  0-20 Units Subcutaneous TID WC  . insulin aspart  0-5 Units Subcutaneous QHS  . insulin glargine  5 Units Subcutaneous BID  . ipratropium  0.5 mg Nebulization Q4H  . levalbuterol  0.63 mg Nebulization 6 times per day  . methylPREDNISolone (SOLU-MEDROL) injection  60 mg Intravenous Q6H  . rosuvastatin  40  mg Oral Daily  . sodium chloride  3 mL Intravenous Q12H  . tamsulosin  0.4 mg Oral BID   Continuous Infusions:  PRN Meds:.acetaminophen **OR** acetaminophen, fluticasone, haloperidol lactate, metoprolol, morphine injection, nitroGLYCERIN, [DISCONTINUED] ondansetron **OR** ondansetron (ZOFRAN) IV  Assessment/Plan: Principal Problem:   Acute-on-chronic respiratory failure (HCC) Active Problems:   Abdominal aortic aneurysm (HCC)   Emphysema/COPD (HCC)   Pacemaker-St.Jude   CAD (coronary artery disease)   HLD (hyperlipidemia)   COPD exacerbation (HCC)   Permanent atrial fibrillation (HCC)   Depression   Chronic combined systolic and diastolic congestive heart  failure (HCC)   COPD, severe (HCC)   GERD (gastroesophageal reflux disease)   BPH (benign prostatic hyperplasia)   Malnutrition of moderate degree   Atrial fibrillation with RVR (HCC)  Atrial fib with RVR HR on his normal meds uncontrolled -were going to increase dilt po to 360 but overnight HR soft to 85 systolic so have increased only to dilt 300 mg. Also with IV lopressor 5 mg every 4 hours prn for HR > 115.  Dr. Antoine Poche to see again today. Respiratory status is driving HR as well. Pt not aware of racing HR. HR at times to 100  -on Eliquis CHA2DS2-VASc Score is 6  CAD with hx MI neg troponin, no chest pain.  COPD per IM/Pulmonary- with bipap/solumedrol -off BiPap breathing some better Planning paillative care consult  Constipation--No BM since admit per pt -per IM   LOS: 6 days   Time spent with pt. :15 minutes. [facility]  Nurse Practitioner Certified Pager [phone] or after 5pm and on weekends call [phone] 10/06/2015, 8:13 AM  History and all data above reviewed.  Patient examined.  I agree with the findings as above.  Breathing better.  Not at baseline but better.  The patient exam reveals WGN:[id number]  ,  Lungs: Decreased breath sounds improved  ,  Abd: Positive bowel sounds, no rebound no guarding, Ext No edema   .  All available labs, radiology testing, previous records reviewed. Agree with documented assessment and plan. Atrial fib:  Rate is better as breathing gets better.  Continue current dose of Cardizem.  However, if BP allows and rate still up tomorrow increase Cardizem further.   Fayrene Fearing [facility]  10:28 AM  10/06/2015

## 2015-10-06 NOTE — Progress Notes (Signed)
PCCM PROGRESS NOTE  ADMISSION DATE: 09/30/2015 CONSULT DATE: 09/30/2015 REFERRING PROVIDER: Dr. Rito Ehrlich  CC: Short of breath  SUBJECTIVE: Didn't use BiPAP last night.  Still feels weak, but breathing improved.  VITAL SIGNS: BP 124/100 mmHg  Pulse 48  Temp(Src) 97.7 F (36.5 C) (Axillary)  Resp 20  Ht  (1.753 m)  Wt 171 lb 1.2 oz (77.6 kg)  BMI 25.25 kg/m2  SpO2 100%  INTAKE/OUTPUT: I/O last 3 completed shifts: In: 900 [P.O.:850; IV Piggyback:50] Out: 1750 [Urine:1750]  General: sleepy, wakes up easily HEENT: no sinus tenderness Cardiac: irregular, no murmur Chest: prolonged exhalation, no wheeze Abd: soft, non tender Ext: ankle edema Neuro: follows commands Skin: no rashes   CBC Recent Labs     10/04/15  0357  WBC  14.2*  HGB  15.1  HCT  49.0  PLT  161    BMET Recent Labs     10/04/15  0357  10/05/15  0400  NA  141  138  K  4.3  4.3  CL  98*  91*  CO2  34*  38*  BUN  52*  54*  CREATININE  1.23  1.13  GLUCOSE  237*  282*    Electrolytes Recent Labs     10/04/15  0357  10/05/15  0400  CALCIUM  9.4  8.7*    Liver Enzymes No results for input(s): AST, ALT, ALKPHOS, BILITOT, ALBUMIN in the last 72 hours.  Glucose Recent Labs     10/04/15  1642  10/04/15  2147  10/05/15  0721  10/05/15  1139  10/05/15  1537  10/05/15  2225  GLUCAP  228*  194*  244*  291*  288*  232*    Imaging No results found.  CULTURES: 1/14 Influenza >> negative 1/14 Pneumococcal Ag >> Positive 1/15 Sputum >> oral flora  ANTIBIOTICS: 1/14 Zithromax >> 1/16 1/16 Rocephin >>  STUDIES:  EVENTS: 1/14 Admit 1/16 Obtunded, stuck on BiPAP 1/17 Mental status improved 1/18 Back on BiPAP; cardiology consulted for a fib  DISCUSSION: 82 yo male former smoker with cough, dyspnea, wheeze, hypoxia from AECOPD and Pneumococcal tracheobronchitis.  He has hx of advanced COPD on chronic prednisone, chronic systolic/diastolic CHF with EF 35 to 40% from Oct 2014,  Chronic a fib on eliquis, CAD, HTN, HLD, s/p PM, GERD, DM.  He is DNR/DNI.  ASSESSMENT/PLAN:  Acute hypoxic/hypercapnic respiratory failure. Plan: - oxygen to keep SpO2 88 to 95% - BiPAP prn >> might be able to d/c soon - prn morphine for dyspnea  AECOPD. Pneumococcal tracheobronchitis. Plan: - pulmicort, xopenex, ipratropium - continue solumedrol - Day 7 of Abx, changed to rocephin - f/u CXR intermittently  Chronic combined CHF. Hx of A fib, CAD, HTN, HLD. Plan: - per primary team and cardiology  Acute encephalopathy likely 2nd to hypercapnia >> improved. Plan: - monitor mental status  AKI >> improved. Plan: - monitor renal fx while getting lasix  Goals of Care >> DNR/DNI.  D/w Dr. Robb Matar.  PCCM will f/u Monday 10/09/15 >> call if help needed sooner.  Coralyn Helling, MD [facility] Pulmonary/Critical Care 10/06/2015, 8:32 AM Pager:  [phone] After 3pm call: [phone]

## 2015-10-06 NOTE — Progress Notes (Signed)
Nutrition Follow-up  DOCUMENTATION CODES:   Non-severe (moderate) malnutrition in context of chronic illness  INTERVENTION:  -Ensure Enlive po BID, each supplement provides 350 kcal and 20 grams of protein -Encourage PO intake -RD to continue to monitor for needs  NUTRITION DIAGNOSIS:   Inadequate oral intake related to other (see comment) (SOB) as evidenced by meal completion < 25%.  ongoing  GOAL:   Patient will meet greater than or equal to 90% of their needs  Doesn't appear to be meeting  MONITOR:   PO intake, Supplement acceptance, Labs, Weight trends, Skin, I & O's  REASON FOR ASSESSMENT:   Consult COPD Protocol  ASSESSMENT:   82 year old Caucasian male with a past medical history of advanced COPD not on home oxygen, hypertension, hyperlipidemia, diabetes, coronary artery disease, atrial fibrillation on anticoagulation, come mind, systolic and diastolic congestive heart failure, presented with cough and shortness of breath. He was found to have acute COPD exacerbation. He was placed on BiPAP and was admitted to the hospital.  Per cardiology note, pt has not had a BM thus far. Breathing improving, pt is off BiPaP some now, but still needs work. Pt appears to work hard to breathe at this point. Was on [place] during visit.  PO intake 15-50% during past 4 meals, per chart. Per patient, he is completing at least half of the meals. Will order ensure to help pt get more calories.  Medications: Lopressor PRN, Diltiazem, Solumedrol, Labs: CBGs 189-291, Cl 90, BUN 43   Diet Order:  DIET DYS 3 Room service appropriate?: Yes; Fluid consistency:: Thin  Skin:  Reviewed, no issues  Last BM:  1/13  Height:   Ht Readings from Last 1 Encounters:  09/30/15  (1.753 m)    Weight:   Wt Readings from Last 1 Encounters:  10/06/15 171 lb 1.2 oz (77.6 kg)    Ideal Body Weight:  72.7 kg  BMI:  Body mass index is 25.25 kg/(m^2).  Estimated Nutritional Needs:   Kcal:   2300-2500  Protein:  115-125g  Fluid:  2.4L/day  EDUCATION NEEDS:   No education needs identified at this time  Dionne Ano. Herman Fiero, MS, RD LDN After Hours/Weekend Pager [phone]

## 2015-10-07 DIAGNOSIS — I2510 Atherosclerotic heart disease of native coronary artery without angina pectoris: Secondary | ICD-10-CM

## 2015-10-07 DIAGNOSIS — I482 Chronic atrial fibrillation: Secondary | ICD-10-CM

## 2015-10-07 DIAGNOSIS — Z950 Presence of cardiac pacemaker: Secondary | ICD-10-CM

## 2015-10-07 DIAGNOSIS — J439 Emphysema, unspecified: Secondary | ICD-10-CM

## 2015-10-07 DIAGNOSIS — E1365 Other specified diabetes mellitus with hyperglycemia: Secondary | ICD-10-CM

## 2015-10-07 LAB — BASIC METABOLIC PANEL
ANION GAP: 11 (ref 5–15)
BUN: 45 mg/dL — ABNORMAL HIGH (ref 6–20)
CALCIUM: 9.1 mg/dL (ref 8.9–10.3)
CHLORIDE: 92 mmol/L — AB (ref 101–111)
CO2: 35 mmol/L — AB (ref 22–32)
Creatinine, Ser: 1.06 mg/dL (ref 0.61–1.24)
GFR calc non Af Amer: >60 mL/min (ref >60)
GLUCOSE: 203 mg/dL — AB (ref 65–99)
Potassium: 4.5 mmol/L (ref 3.5–5.1)
Sodium: 138 mmol/L (ref 135–145)

## 2015-10-07 LAB — GLUCOSE, CAPILLARY
GLUCOSE-CAPILLARY: 228 mg/dL — AB (ref 65–99)
GLUCOSE-CAPILLARY: 255 mg/dL — AB (ref 65–99)
Glucose-Capillary: 205 mg/dL — ABNORMAL HIGH (ref 65–99)

## 2015-10-07 MED ORDER — INSULIN GLARGINE 100 UNIT/ML ~~LOC~~ SOLN
10.0000 [IU] | Freq: Two times a day (BID) | SUBCUTANEOUS | Status: DC
  Administered 2015-10-07 – 2015-10-10 (×6): 10 [IU] via SUBCUTANEOUS
  Filled 2015-10-07 (×7): qty 0.1

## 2015-10-07 MED ORDER — DILTIAZEM HCL ER COATED BEADS 180 MG PO CP24
360.0000 mg | ORAL_CAPSULE | Freq: Every day | ORAL | Status: DC
  Administered 2015-10-07 – 2015-10-10 (×4): 360 mg via ORAL
  Filled 2015-10-07 (×4): qty 2

## 2015-10-07 NOTE — Progress Notes (Signed)
Patient Name: [name] Date of Encounter: 10/07/2015  Principal Problem:   Acute-on-chronic respiratory failure (HCC) Active Problems:   Abdominal aortic aneurysm (HCC)   Emphysema/COPD (HCC)   Pacemaker-St.Jude   CAD (coronary artery disease)   HLD (hyperlipidemia)   COPD exacerbation (HCC)   Permanent atrial fibrillation (HCC)   Depression   Chronic combined systolic and diastolic congestive heart failure (HCC)   COPD, severe (HCC)   GERD (gastroesophageal reflux disease)   BPH (benign prostatic hyperplasia)   Malnutrition of moderate degree   Atrial fibrillation with RVR (HCC)   Length of Stay: 7  SUBJECTIVE  Resting comfortably. Respiratory status seems to be improving. Ventricular rate improving in parallel, although still consistently tachycardic at rest.  82 year old man with COPD, chronic a fib, VTach, CAD (STEMI in 2011,  07/2011 DES to LCX-CTO,  CTO of RCA with L-R collaterals, nonobstructive LAD stenosis), symptomatic brady s/p 2012 St. Jude PPM.EF 35-40% on last echo 06/2013, moderate MR, moderate biatrial dilation.  Pt admitted 09/29/14 With acute on chronic respiratory failure and COPD exacerbation, complicated by AF with RVR, intolerant to IV meds due to hypotension. Not felt to have HF at that time.CHA2DS2-VASc Score is 6  CURRENT MEDS . antiseptic oral rinse  7 mL Mouth Rinse q12n4p  . apixaban  5 mg Oral BID  . bisoprolol  10 mg Oral Daily  . budesonide (PULMICORT) nebulizer solution  0.5 mg Nebulization BID  . cefTRIAXone (ROCEPHIN)  IV  1 g Intravenous Q24H  . chlorhexidine  15 mL Mouth Rinse BID  . digoxin  0.125 mg Oral Daily  . diltiazem  300 mg Oral Daily  . feeding supplement (ENSURE ENLIVE)  237 mL Oral BID BM  . finasteride  5 mg Oral Daily  . insulin aspart  0-20 Units Subcutaneous TID WC  . insulin aspart  0-5 Units Subcutaneous QHS  . insulin glargine  5 Units Subcutaneous BID  . ipratropium  0.5 mg Nebulization Q4H  .  levalbuterol  0.63 mg Nebulization 6 times per day  . methylPREDNISolone (SOLU-MEDROL) injection  60 mg Intravenous Q6H  . rosuvastatin  40 mg Oral Daily  . sodium chloride  3 mL Intravenous Q12H  . tamsulosin  0.4 mg Oral BID    OBJECTIVE   Intake/Output Summary (Last 24 hours) at 10/07/15 0754 Last data filed at 10/07/15 0700  Gross per 24 hour  Intake    420 ml  Output   1600 ml  Net  -1180 ml   Filed Weights   10/05/15 0322 10/06/15 0500 10/07/15 0444  Weight: 170 lb 6.7 oz (77.3 kg) 171 lb 1.2 oz (77.6 kg) 175 lb 0.7 oz (79.4 kg)    PHYSICAL EXAM Filed Vitals:   10/07/15 0444 10/07/15 0500 10/07/15 0600 10/07/15 0700  BP:      Pulse:  51 47 107  Temp: 97.9 F (36.6 C)     TempSrc: Axillary     Resp:  Height:      Weight: 175 lb 0.7 oz (79.4 kg)     SpO2:  99% 98% 98%   General: Alert, oriented x3, no distress Head: no evidence of trauma, PERRL, EOMI, no exophtalmos or lid lag, no myxedema, no xanthelasma; normal ears, nose and oropharynx Neck: normal jugular venous pulsations and no hepatojugular reflux; brisk carotid pulses without delay and no carotid bruits Chest: distant breath sounds and prolonged expiration, but otherwise clear to auscultation Cardiovascular: normal position and quality  of the apical impulse, irregular rhythm, distant first and second heart sounds, no rubs or gallops, no murmur Abdomen: no tenderness or distention, no masses by palpation, no abnormal pulsatility or arterial bruits, normal bowel sounds, no hepatosplenomegaly Extremities: no clubbing, cyanosis or edema; 2+ radial, ulnar and brachial pulses bilaterally; 2+ right femoral, posterior tibial and dorsalis pedis pulses; 2+ left femoral, posterior tibial and dorsalis pedis pulses; no subclavian or femoral bruits Neurological: grossly nonfocal  LABS  CBC No results for input(s): WBC, NEUTROABS, HGB, HCT, MCV, PLT in the last 72 hours. Basic Metabolic Panel  Recent Labs   16/10/96 0800 10/07/15 0338  NA 135 138  K 4.4 4.5  CL 90* 92*  CO2 35* 35*  GLUCOSE 211* 203*  BUN 43* 45*  CREATININE 0.98 1.06  CALCIUM 8.7* 9.1    Radiology Studies Imaging results have been reviewed and No results found.  TELE Afib with RVR 100-120  ECG atrial fibrillation, atypical LBBB  ASSESSMENT AND PLAN  Atrial fib with RVR BP a little higher. Will increase diltiazem to  daily. On Eliquis CHA2DS2-VASc Score 6.   CAD with hx MI neg troponin, no chest pain.  CHF (combined systolic and diastolic, chronic) -no HF on Xray, BNP slightly elevated from baseline  COPD with acute exacerbation- small improvement daily   Thurmon Fair, MD, [facility] [phone] office [phone] pager 10/07/2015 7:54 AM

## 2015-10-07 NOTE — Progress Notes (Addendum)
[facility] PROGRESS NOTE    Progress Note   [name] ZOX:[id number] DOB: Nov 29, 1927 DOA: 09/30/2015 PCP: Martha Clan, MD   Brief Narrative:   [name] is an 82 y.o. male With past medical history of advanced COPD on home oxygen, diabetes mellitus, CAD atrial fibrillation on apixiban chronic systolic and diastolic heart failure comes to the ED complaining of cough and shortness of breath. She was found to be in acute COPD exacerbation placed on BiPAP and admitted to the hospital. She was started on IV Solu-Medrol steroids and PCCM was consulted. Patient  Cannot tolerate BiPAP when necessary.  Assessment/Plan:   Acute-on-chronic respiratory failure (HCC) Due to acute COPD exacerbation/acute encephalopathy: PCCM was consulted and recommended to continue BiPAP and IV morphine.  Cont IV Solu-Medrol steroids and inhalers. Cont morphine PRN I d/w the patient again he would like to cont aggressive treatment.  Chronic, systolic and diastolic heart failure: Seems to be euvolemic, continue current regimen.  CAD: Asymptomatic continue medical management.  Atrial Fibrillation with RVR CHA2DS2-VASc Score is 6. Patient is on Eliquis. Cardiology on board, appreciate assistance, heart rate continues to improve. Diltiazem was increased.  Controlled diabetes mellitus type 2: A1c was 6.7. Titrate Lantus, his blood glucose continues to be high likely due to steroids.  Acute confusional state: Resolved continue Haldol when necessary.   BPH: Continue Flomax and Proscar  Malnutrition of moderate degree     DVT Prophylaxis - Lovenox ordered.  Family Communication: daughter Disposition Plan: keep in SDU Code Status:     Code Status Orders        Start     Ordered   09/30/15 0534  Do not attempt resuscitation (DNR)   Continuous    Question Answer Comment  In the event of cardiac or respiratory ARREST Do not call a "code blue"   In the event of cardiac or  respiratory ARREST Do not perform Intubation, CPR, defibrillation or ACLS   In the event of cardiac or respiratory ARREST Use medication by any route, position, wound care, and other measures to relive pain and suffering. May use oxygen, suction and manual treatment of airway obstruction as needed for comfort.      09/30/15 0534    Code Status History    Date Active Date Inactive Code Status Order ID Comments User Context   03/27/2014 10:53 PM 03/29/2014  2:51 PM DNR [id number]  Alysia Penna, MD Inpatient   12/31/2013  5:02 PM 01/06/2014  6:19 PM DNR [id number]  Marrian Salvage, MD ED   09/09/2013  4:03 PM 09/17/2013  2:54 PM DNR [id number]  Hoyle Sauer, MD Inpatient   07/09/2013  7:51 PM 07/13/2013  1:34 PM DNR [id number]  Jarome Matin, MD Inpatient   07/09/2013  1:18 PM 07/09/2013  7:51 PM Full Code [id number]  Jarome Matin, MD ED   09/28/2012  7:29 AM 10/01/2012  6:12 PM DNR [id number]  Kari Baars, MD Inpatient   04/04/2012  1:02 PM 04/14/2012  2:29 PM DNR [id number]  Jarome Matin, MD ED   04/04/2012 12:57 PM 04/04/2012  1:02 PM DNR [id number]  Jarome Matin, MD ED   03/10/2012  5:58 PM 03/19/2012  4:40 PM DNR [id number]  Tammy Sours, RN Inpatient   11/13/2011 11:11 AM 11/15/2011  8:15 PM DNR [id number]  Lorinda Creed, NP Inpatient   11/08/2011  3:03 PM 11/13/2011 11:10 AM Partial Code [id number]  Kalman Shan, MD Inpatient   11/07/2011  4:13 PM 11/08/2011  3:03 PM  Full Code [id number]  Theora Gianotti, RN Inpatient    Advance Directive Documentation        Most Recent Value   Type of Advance Directive  Healthcare Power of Attorney, Living will   Pre-existing out of facility DNR order (yellow form or pink MOST form)     "MOST" Form in Place?          IV Access:    Peripheral IV   Procedures and diagnostic studies:   No results found.   Medical Consultants:    None.  Anti-Infectives:   Anti-infectives    Start     Dose/Rate Route Frequency Ordered Stop   10/02/15 1000   cefTRIAXone (ROCEPHIN) 1 g in dextrose 5 % 50 mL IVPB     1 g 100 mL/hr over 30 Minutes Intravenous Every 24 hours 10/02/15 0912     10/01/15 1000  azithromycin (ZITHROMAX) tablet 250 mg  Status:  Discontinued     250 mg Oral Daily 09/30/15 0530 10/02/15 0920   09/30/15 1000  azithromycin (ZITHROMAX) tablet 500 mg     500 mg Oral Daily 09/30/15 0530 09/30/15 0944      Subjective:    [name]  he relates his breathing is better than yesterday.  Objective:    Filed Vitals:   10/07/15 0444 10/07/15 0500 10/07/15 0600 10/07/15 0700  BP:      Pulse:  51 47 107  Temp: 97.9 F (36.6 C)     TempSrc: Axillary     Resp:  Height:      Weight: 79.4 kg (175 lb 0.7 oz)     SpO2:  99% 98% 98%    Intake/Output Summary (Last 24 hours) at 10/07/15 0754 Last data filed at 10/07/15 0700  Gross per 24 hour  Intake    420 ml  Output   1600 ml  Net  -1180 ml   Filed Weights   10/05/15 0322 10/06/15 0500 10/07/15 0444  Weight: 77.3 kg (170 lb 6.7 oz) 77.6 kg (171 lb 1.2 oz) 79.4 kg (175 lb 0.7 oz)    Exam: Gen:  Awake alert and oriented 3 Cardiovascular:  RRR. Chest and lungs:   Moderate air movement and no wheezing. Abdomen:  Abdomen soft, NT/ND, + BS Extremities:  No edema.   Data Reviewed:    Labs: Basic Metabolic Panel:  Recent Labs Lab 10/03/15 0333 10/04/15 0357 10/05/15 0400 10/06/15 0800 10/07/15 0338  NA 142 141 138 135 138  K 4.3 4.3 4.3 4.4 4.5  CL 99* 98* 91* 90* 92*  CO2 30 34* 38* 35* 35*  GLUCOSE 169* 237* 282* 211* 203*  BUN 53* 52* 54* 43* 45*  CREATININE 1.45* 1.23 1.13 0.98 1.06  CALCIUM 9.1 9.4 8.7* 8.7* 9.1   GFR Estimated Creatinine Clearance: 50.9 mL/min (by C-G formula based on Cr of 1.06). Liver Function Tests:  Recent Labs Lab 10/01/15 0424  AST 29  ALT 30  ALKPHOS 86  BILITOT 1.0  PROT 6.3*  ALBUMIN 3.9   No results for input(s): LIPASE, AMYLASE in the last 168 hours. No results for input(s): AMMONIA in the  last 168 hours. Coagulation profile No results for input(s): INR, PROTIME in the last 168 hours.  CBC:  Recent Labs Lab 10/01/15 0424 10/03/15 0333 10/04/15 0357  WBC 10.3 18.2* 14.2*  HGB 14.1 15.2 15.1  HCT 45.5 50.4 49.0  MCV 98.5 99.8 98.4  PLT 114* 162 161   Cardiac  Enzymes: No results for input(s): CKTOTAL, CKMB, CKMBINDEX, TROPONINI in the last 168 hours. BNP (last 3 results) No results for input(s): PROBNP in the last 8760 hours. CBG:  Recent Labs Lab 10/06/15 0730 10/06/15 1108 10/06/15 1113 10/06/15 1555 10/06/15 2107  GLUCAP 189* 307* 303* 262* 260*   D-Dimer: No results for input(s): DDIMER in the last 72 hours. Hgb A1c: No results for input(s): HGBA1C in the last 72 hours. Lipid Profile: No results for input(s): CHOL, HDL, LDLCALC, TRIG, CHOLHDL, LDLDIRECT in the last 72 hours. Thyroid function studies: No results for input(s): TSH, T4TOTAL, T3FREE, THYROIDAB in the last 72 hours.  Invalid input(s): FREET3 Anemia work up: No results for input(s): VITAMINB12, FOLATE, FERRITIN, TIBC, IRON, RETICCTPCT in the last 72 hours. Sepsis Labs:  Recent Labs Lab 10/01/15 0424 10/03/15 0333 10/04/15 0357  WBC 10.3 18.2* 14.2*   Microbiology Recent Results (from the past 240 hour(s))  MRSA PCR Screening     Status: None   Collection Time: 09/30/15  9:40 PM  Result Value Ref Range Status   MRSA by PCR NEGATIVE NEGATIVE Final    Comment:        The GeneXpert MRSA Assay (FDA approved for NASAL specimens only), is one component of a comprehensive MRSA colonization surveillance program. It is not intended to diagnose MRSA infection nor to guide or monitor treatment for MRSA infections.   Culture, respiratory (NON-Expectorated)     Status: None   Collection Time: 10/01/15  1:30 PM  Result Value Ref Range Status   Specimen Description SPUTUM  Final   Special Requests NONE  Final   Gram Stain   Final    ABUNDANT WBC PRESENT,BOTH PMN AND  MONONUCLEAR NO SQUAMOUS EPITHELIAL CELLS SEEN FEW GRAM POSITIVE COCCI IN CLUSTERS Performed at [facility]    Culture   Final    NORMAL OROPHARYNGEAL FLORA Performed at [facility]    Report Status 10/03/2015 FINAL  Final  Culture, expectorated sputum-assessment     Status: None   Collection Time: 10/01/15  1:32 PM  Result Value Ref Range Status   Specimen Description SPUTUM  Final   Special Requests NONE  Final   Sputum evaluation   Final    THIS SPECIMEN IS ACCEPTABLE. RESPIRATORY CULTURE REPORT TO FOLLOW.   Report Status 10/01/2015 FINAL  Final     Medications:   . antiseptic oral rinse  7 mL Mouth Rinse q12n4p  . apixaban  5 mg Oral BID  . bisoprolol  10 mg Oral Daily  . budesonide (PULMICORT) nebulizer solution  0.5 mg Nebulization BID  . cefTRIAXone (ROCEPHIN)  IV  1 g Intravenous Q24H  . chlorhexidine  15 mL Mouth Rinse BID  . digoxin  0.125 mg Oral Daily  . diltiazem  300 mg Oral Daily  . feeding supplement (ENSURE ENLIVE)  237 mL Oral BID BM  . finasteride  5 mg Oral Daily  . insulin aspart  0-20 Units Subcutaneous TID WC  . insulin aspart  0-5 Units Subcutaneous QHS  . insulin glargine  5 Units Subcutaneous BID  . ipratropium  0.5 mg Nebulization Q4H  . levalbuterol  0.63 mg Nebulization 6 times per day  . methylPREDNISolone (SOLU-MEDROL) injection  60 mg Intravenous Q6H  . rosuvastatin  40 mg Oral Daily  . sodium chloride  3 mL Intravenous Q12H  . tamsulosin  0.4 mg Oral BID   Continuous Infusions:   Time spent: 25 min   LOS: 7 days  Marinda Elk  [facility] Pager [phone]  *Please refer to [email or web address], password [email or web address] to get updated schedule on who will round on this patient, as hospitalists switch teams weekly. If 7PM-7AM, please contact night-coverage at [email or web address], password [email or web address] for any overnight needs.  10/07/2015, 7:54 AM

## 2015-10-07 NOTE — Progress Notes (Signed)
CSW called [name] (ph#: [phone]) to confirm that patient is from Independent Living, not Assisted Living.   Please order PT eval for disposition recommendation.    [name], LCSW [facility] Clinical Social Worker cell #: [phone]

## 2015-10-07 NOTE — Progress Notes (Signed)
RT Note:  Bipap is on stand by at this time.  No distress noted.  RT will continue to monitor.

## 2015-10-08 DIAGNOSIS — J449 Chronic obstructive pulmonary disease, unspecified: Secondary | ICD-10-CM

## 2015-10-08 LAB — BASIC METABOLIC PANEL
ANION GAP: 10 (ref 5–15)
BUN: 42 mg/dL — ABNORMAL HIGH (ref 6–20)
CALCIUM: 8.8 mg/dL — AB (ref 8.9–10.3)
CO2: 36 mmol/L — ABNORMAL HIGH (ref 22–32)
CREATININE: 0.96 mg/dL (ref 0.61–1.24)
Chloride: 91 mmol/L — ABNORMAL LOW (ref 101–111)
Glucose, Bld: 194 mg/dL — ABNORMAL HIGH (ref 65–99)
Potassium: 4.6 mmol/L (ref 3.5–5.1)
SODIUM: 137 mmol/L (ref 135–145)

## 2015-10-08 LAB — GLUCOSE, CAPILLARY
GLUCOSE-CAPILLARY: 195 mg/dL — AB (ref 65–99)
GLUCOSE-CAPILLARY: 143 mg/dL — AB (ref 65–99)
Glucose-Capillary: 301 mg/dL — ABNORMAL HIGH (ref 65–99)
Glucose-Capillary: 280 mg/dL — ABNORMAL HIGH (ref 65–99)
Glucose-Capillary: 293 mg/dL — ABNORMAL HIGH (ref 65–99)

## 2015-10-08 NOTE — Progress Notes (Signed)
Patient Name: [name] Date of Encounter: 10/08/2015  Principal Problem:   Acute-on-chronic respiratory failure (HCC) Active Problems:   Abdominal aortic aneurysm (HCC)   Emphysema/COPD (HCC)   Pacemaker-St.Jude   CAD (coronary artery disease)   HLD (hyperlipidemia)   COPD exacerbation (HCC)   Permanent atrial fibrillation (HCC)   Depression   Chronic combined systolic and diastolic congestive heart failure (HCC)   COPD, severe (HCC)   GERD (gastroesophageal reflux disease)   BPH (benign prostatic hyperplasia)   Malnutrition of moderate degree   Atrial fibrillation with RVR (HCC)   Length of Stay: 8  SUBJECTIVE  Much more alert today, but also very confused. No real distress, eating breakfast. Not very cooperative.  CURRENT MEDS . antiseptic oral rinse  7 mL Mouth Rinse q12n4p  . apixaban  5 mg Oral BID  . bisoprolol  10 mg Oral Daily  . budesonide (PULMICORT) nebulizer solution  0.5 mg Nebulization BID  . cefTRIAXone (ROCEPHIN)  IV  1 g Intravenous Q24H  . chlorhexidine  15 mL Mouth Rinse BID  . digoxin  0.125 mg Oral Daily  . diltiazem  360 mg Oral Daily  . feeding supplement (ENSURE ENLIVE)  237 mL Oral BID BM  . finasteride  5 mg Oral Daily  . insulin aspart  0-20 Units Subcutaneous TID WC  . insulin aspart  0-5 Units Subcutaneous QHS  . insulin glargine  10 Units Subcutaneous BID  . ipratropium  0.5 mg Nebulization Q4H  . levalbuterol  0.63 mg Nebulization 6 times per day  . methylPREDNISolone (SOLU-MEDROL) injection  60 mg Intravenous Q6H  . rosuvastatin  40 mg Oral Daily  . sodium chloride  3 mL Intravenous Q12H  . tamsulosin  0.4 mg Oral BID    OBJECTIVE   Intake/Output Summary (Last 24 hours) at 10/08/15 0813 Last data filed at 10/08/15 0600  Gross per 24 hour  Intake   1200 ml  Output    825 ml  Net    375 ml   Filed Weights   10/06/15 0500 10/07/15 0444 10/08/15 0411  Weight: 171 lb 1.2 oz (77.6 kg) 175 lb 0.7 oz (79.4 kg) 173 lb 8 oz  (78.7 kg)    PHYSICAL EXAM Filed Vitals:   10/08/15 0049 10/08/15 0320 10/08/15 0411 10/08/15 0429  BP:    121/78  Pulse:  88  39  Temp: 97.7 F (36.5 C)  97.4 F (36.3 C)   TempSrc: Oral  Oral   Resp:  22  25  Height:      Weight:   173 lb 8 oz (78.7 kg)   SpO2:  99%  98%   Unable to examine today  LABS Basic Metabolic Panel  Recent Labs  10/07/15 0338 10/08/15 0339  NA 138 137  K 4.5 4.6  CL 92* 91*  CO2 35* 36*  GLUCOSE 203* 194*  BUN 45* 42*  CREATININE 1.06 0.96  CALCIUM 9.1 8.8*    Radiology Studies Imaging results have been reviewed and No results found.  TELE atrial fibrillation with average ventricular rate 105 bpm  ASSESSMENT AND PLAN  Atrial fib with RVR Tolerating higher dose diltiazem. Although rate control is not ideal, it is probably acceptable under the current conditions. Little room for additional conventional AV blocking meds. He was on amiodarone in the past and tolerated it well, but it was stopped due to concern for possible additional pulmonary deterioration. Amiodarone remains an option for temporary rate control. On Eliquis CHA2DS2-VASc Score  6.   CAD with hx MI; neg troponin, no chest pain.  CHF (combined systolic and diastolic, chronic) - no HF on Xray, BNP slightly elevated from baseline. Probably euvolemic  COPD with acute exacerbation- small improvement daily  Delirium    Thurmon Fair, MD, [facility] [phone] office [phone] pager 10/08/2015 8:13 AM

## 2015-10-08 NOTE — Progress Notes (Signed)
[facility] PROGRESS NOTE    Progress Note   [name] ZOX:[id number] DOB: Mar 12, 1928 DOA: 09/30/2015 PCP: Martha Clan, MD   Brief Narrative:   [name] is an 82 y.o. male With past medical history of advanced COPD on home oxygen, diabetes mellitus, CAD atrial fibrillation on apixiban chronic systolic and diastolic heart failure comes to the ED complaining of cough and shortness of breath. She was found to be in acute COPD exacerbation placed on BiPAP and admitted to the hospital. She was started on IV Solu-Medrol steroids and PCCM was consulted. Patient  Cannot tolerate BiPAP when necessary.  Assessment/Plan:   Acute-on-chronic respiratory failure (HCC) Due to acute COPD exacerbation/acute metabolic encephalopathy: PCCM was consulted and recommended to continue BiPAP and IV morphine.  Cont IV Solu-Medrol steroids and inhalers. He seems to be more stable his heart rate is improving, on physical exam he's not using accessory muscles to breathe wheezing has improved continues to have moderate air movement. Can't be transferred to telemetry. Cont morphine PRN I d/w the patient again he would like to cont aggressive treatment.  Chronic, systolic and diastolic heart failure: Seems to be euvolemic, continue current regimen.  CAD: Asymptomatic continue medical management.  Atrial Fibrillation with RVR CHA2DS2-VASc Score is 6. Patient is on Eliquis. Heart rate is hard to control continues to be in mild RVR Cardiology on board, appreciate assistance, heart rate continues to improve. Diltiazem was increased.  Controlled diabetes mellitus type 2: A1c was 6.7. Titrate Lantus, his blood glucose continues to be high likely due to steroids.  Acute confusional state: Resolved continue Haldol when necessary.   BPH: Continue Flomax and Proscar  Acute confusional state: Start Haldol when necessary.  Malnutrition of moderate degree     DVT Prophylaxis - Lovenox  ordered.  Family Communication: daughter Disposition Plan: transfer to telemetry Code Status:     Code Status Orders        Start     Ordered   09/30/15 0534  Do not attempt resuscitation (DNR)   Continuous    Question Answer Comment  In the event of cardiac or respiratory ARREST Do not call a "code blue"   In the event of cardiac or respiratory ARREST Do not perform Intubation, CPR, defibrillation or ACLS   In the event of cardiac or respiratory ARREST Use medication by any route, position, wound care, and other measures to relive pain and suffering. May use oxygen, suction and manual treatment of airway obstruction as needed for comfort.      09/30/15 0534    Code Status History    Date Active Date Inactive Code Status Order ID Comments User Context   03/27/2014 10:53 PM 03/29/2014  2:51 PM DNR [id number]  Alysia Penna, MD Inpatient   12/31/2013  5:02 PM 01/06/2014  6:19 PM DNR [id number]  Marrian Salvage, MD ED   09/09/2013  4:03 PM 09/17/2013  2:54 PM DNR [id number]  Hoyle Sauer, MD Inpatient   07/09/2013  7:51 PM 07/13/2013  1:34 PM DNR [id number]  Jarome Matin, MD Inpatient   07/09/2013  1:18 PM 07/09/2013  7:51 PM Full Code [id number]  Jarome Matin, MD ED   09/28/2012  7:29 AM 10/01/2012  6:12 PM DNR [id number]  Kari Baars, MD Inpatient   04/04/2012  1:02 PM 04/14/2012  2:29 PM DNR [id number]  Jarome Matin, MD ED   04/04/2012 12:57 PM 04/04/2012  1:02 PM DNR [id number]  Jarome Matin, MD ED   03/10/2012  5:58 PM 03/19/2012  4:40 PM DNR [id number]  Tammy Sours, RN Inpatient   11/13/2011 11:11 AM 11/15/2011  8:15 PM DNR [id number]  Lorinda Creed, NP Inpatient   11/08/2011  3:03 PM 11/13/2011 11:10 AM Partial Code [id number]  Kalman Shan, MD Inpatient   11/07/2011  4:13 PM 11/08/2011  3:03 PM Full Code [id number]  Theora Gianotti, RN Inpatient    Advance Directive Documentation        Most Recent Value   Type of Advance Directive  Healthcare Power of Attorney, Living will    Pre-existing out of facility DNR order (yellow form or pink MOST form)     "MOST" Form in Place?          IV Access:    Peripheral IV   Procedures and diagnostic studies:   No results found.   Medical Consultants:    None.  Anti-Infectives:   Anti-infectives    Start     Dose/Rate Route Frequency Ordered Stop   10/02/15 1000  cefTRIAXone (ROCEPHIN) 1 g in dextrose 5 % 50 mL IVPB     1 g 100 mL/hr over 30 Minutes Intravenous Every 24 hours 10/02/15 0912     10/01/15 1000  azithromycin (ZITHROMAX) tablet 250 mg  Status:  Discontinued     250 mg Oral Daily 09/30/15 0530 10/02/15 0920   09/30/15 1000  azithromycin (ZITHROMAX) tablet 500 mg     500 mg Oral Daily 09/30/15 0530 09/30/15 0944      Subjective:    [name]  he relates his breathing is better.  Objective:    Filed Vitals:   10/08/15 0049 10/08/15 0320 10/08/15 0411 10/08/15 0429  BP:    121/78  Pulse:  88  39  Temp: 97.7 F (36.5 C)  97.4 F (36.3 C)   TempSrc: Oral  Oral   Resp:  22  25  Height:      Weight:   78.7 kg (173 lb 8 oz)   SpO2:  99%  98%    Intake/Output Summary (Last 24 hours) at 10/08/15 0750 Last data filed at 10/08/15 0600  Gross per 24 hour  Intake   1210 ml  Output    825 ml  Net    385 ml   Filed Weights   10/06/15 0500 10/07/15 0444 10/08/15 0411  Weight: 77.6 kg (171 lb 1.2 oz) 79.4 kg (175 lb 0.7 oz) 78.7 kg (173 lb 8 oz)    Exam: Gen:  Awake alert and oriented 3 Cardiovascular:  RRR. Chest and lungs:   Moderate air movement and no wheezing and clear to auscultation Abdomen:  Abdomen soft, NT/ND, + BS Extremities:  No edema.   Data Reviewed:    Labs: Basic Metabolic Panel:  Recent Labs Lab 10/04/15 0357 10/05/15 0400 10/06/15 0800 10/07/15 0338 10/08/15 0339  NA 141 138 135 138 137  K 4.3 4.3 4.4 4.5 4.6  CL 98* 91* 90* 92* 91*  CO2 34* 38* 35* 35* 36*  GLUCOSE 237* 282* 211* 203* 194*  BUN 52* 54* 43* 45* 42*  CREATININE 1.23 1.13  0.98 1.06 0.96  CALCIUM 9.4 8.7* 8.7* 9.1 8.8*   GFR Estimated Creatinine Clearance: 56.3 mL/min (by C-G formula based on Cr of 0.96). Liver Function Tests: No results for input(s): AST, ALT, ALKPHOS, BILITOT, PROT, ALBUMIN in the last 168 hours. No results for input(s): LIPASE, AMYLASE in the last 168 hours. No results for input(s): AMMONIA in the last 168 hours. Coagulation  profile No results for input(s): INR, PROTIME in the last 168 hours.  CBC:  Recent Labs Lab 10/03/15 0333 10/04/15 0357  WBC 18.2* 14.2*  HGB 15.2 15.1  HCT 50.4 49.0  MCV 99.8 98.4  PLT 162 161   Cardiac Enzymes: No results for input(s): CKTOTAL, CKMB, CKMBINDEX, TROPONINI in the last 168 hours. BNP (last 3 results) No results for input(s): PROBNP in the last 8760 hours. CBG:  Recent Labs Lab 10/06/15 2107 10/07/15 0807 10/07/15 1139 10/07/15 1722 10/07/15 2202  GLUCAP 260* 205* 228* 255* 301*   D-Dimer: No results for input(s): DDIMER in the last 72 hours. Hgb A1c: No results for input(s): HGBA1C in the last 72 hours. Lipid Profile: No results for input(s): CHOL, HDL, LDLCALC, TRIG, CHOLHDL, LDLDIRECT in the last 72 hours. Thyroid function studies: No results for input(s): TSH, T4TOTAL, T3FREE, THYROIDAB in the last 72 hours.  Invalid input(s): FREET3 Anemia work up: No results for input(s): VITAMINB12, FOLATE, FERRITIN, TIBC, IRON, RETICCTPCT in the last 72 hours. Sepsis Labs:  Recent Labs Lab 10/03/15 0333 10/04/15 0357  WBC 18.2* 14.2*   Microbiology Recent Results (from the past 240 hour(s))  MRSA PCR Screening     Status: None   Collection Time: 09/30/15  9:40 PM  Result Value Ref Range Status   MRSA by PCR NEGATIVE NEGATIVE Final    Comment:        The GeneXpert MRSA Assay (FDA approved for NASAL specimens only), is one component of a comprehensive MRSA colonization surveillance program. It is not intended to diagnose MRSA infection nor to guide or monitor  treatment for MRSA infections.   Culture, respiratory (NON-Expectorated)     Status: None   Collection Time: 10/01/15  1:30 PM  Result Value Ref Range Status   Specimen Description SPUTUM  Final   Special Requests NONE  Final   Gram Stain   Final    ABUNDANT WBC PRESENT,BOTH PMN AND MONONUCLEAR NO SQUAMOUS EPITHELIAL CELLS SEEN FEW GRAM POSITIVE COCCI IN CLUSTERS Performed at [facility]    Culture   Final    NORMAL OROPHARYNGEAL FLORA Performed at [facility]    Report Status 10/03/2015 FINAL  Final  Culture, expectorated sputum-assessment     Status: None   Collection Time: 10/01/15  1:32 PM  Result Value Ref Range Status   Specimen Description SPUTUM  Final   Special Requests NONE  Final   Sputum evaluation   Final    THIS SPECIMEN IS ACCEPTABLE. RESPIRATORY CULTURE REPORT TO FOLLOW.   Report Status 10/01/2015 FINAL  Final     Medications:   . antiseptic oral rinse  7 mL Mouth Rinse q12n4p  . apixaban  5 mg Oral BID  . bisoprolol  10 mg Oral Daily  . budesonide (PULMICORT) nebulizer solution  0.5 mg Nebulization BID  . cefTRIAXone (ROCEPHIN)  IV  1 g Intravenous Q24H  . chlorhexidine  15 mL Mouth Rinse BID  . digoxin  0.125 mg Oral Daily  . diltiazem  360 mg Oral Daily  . feeding supplement (ENSURE ENLIVE)  237 mL Oral BID BM  . finasteride  5 mg Oral Daily  . insulin aspart  0-20 Units Subcutaneous TID WC  . insulin aspart  0-5 Units Subcutaneous QHS  . insulin glargine  10 Units Subcutaneous BID  . ipratropium  0.5 mg Nebulization Q4H  . levalbuterol  0.63 mg Nebulization 6 times per day  . methylPREDNISolone (SOLU-MEDROL) injection  60 mg Intravenous Q6H  .  rosuvastatin  40 mg Oral Daily  . sodium chloride  3 mL Intravenous Q12H  . tamsulosin  0.4 mg Oral BID   Continuous Infusions:   Time spent: 25 min   LOS: 8 days   Marinda Elk  [facility] Pager [phone]  *Please refer to [email or web address], password [email or web address] to get updated  schedule on who will round on this patient, as hospitalists switch teams weekly. If 7PM-7AM, please contact night-coverage at [email or web address], password [email or web address] for any overnight needs.  10/08/2015, 7:50 AM

## 2015-10-09 ENCOUNTER — Inpatient Hospital Stay (HOSPITAL_COMMUNITY): Payer: Medicare Other

## 2015-10-09 DIAGNOSIS — J80 Acute respiratory distress syndrome: Secondary | ICD-10-CM

## 2015-10-09 LAB — BLOOD GAS, ARTERIAL
ACID-BASE EXCESS: 8.1 mmol/L — AB (ref 0.0–2.0)
Bicarbonate: 34.9 mEq/L — ABNORMAL HIGH (ref 20.0–24.0)
DRAWN BY: 295031
O2 CONTENT: 4 L/min
O2 SAT: 97.3 %
PATIENT TEMPERATURE: 98.6
PH ART: 7.401 (ref 7.350–7.450)
TCO2: 29.7 mmol/L (ref 0–100)
pCO2 arterial: 57.4 mmHg (ref 35.0–45.0)
pO2, Arterial: 101 mmHg — ABNORMAL HIGH (ref 80.0–100.0)

## 2015-10-09 LAB — GLUCOSE, CAPILLARY
GLUCOSE-CAPILLARY: 202 mg/dL — AB (ref 65–99)
GLUCOSE-CAPILLARY: 188 mg/dL — AB (ref 65–99)
Glucose-Capillary: 291 mg/dL — ABNORMAL HIGH (ref 65–99)

## 2015-10-09 MED ORDER — DM-GUAIFENESIN ER 30-600 MG PO TB12
1.0000 | ORAL_TABLET | Freq: Two times a day (BID) | ORAL | Status: DC
  Administered 2015-10-09 – 2015-10-17 (×16): 1 via ORAL
  Filled 2015-10-09 (×16): qty 1

## 2015-10-09 MED ORDER — METHYLPREDNISOLONE SODIUM SUCC 125 MG IJ SOLR
60.0000 mg | Freq: Two times a day (BID) | INTRAMUSCULAR | Status: DC
  Administered 2015-10-09 – 2015-10-10 (×2): 60 mg via INTRAVENOUS
  Filled 2015-10-09 (×2): qty 2

## 2015-10-09 MED ORDER — HALOPERIDOL LACTATE 5 MG/ML IJ SOLN
3.0000 mg | Freq: Four times a day (QID) | INTRAMUSCULAR | Status: DC
  Administered 2015-10-09 – 2015-10-11 (×8): 3 mg via INTRAVENOUS
  Filled 2015-10-09 (×9): qty 1

## 2015-10-09 NOTE — Progress Notes (Signed)
  Echocardiogram 2D Echocardiogram has been performed.  Arvil Chaco 10/09/2015, 3:35 PM

## 2015-10-09 NOTE — Progress Notes (Signed)
  Echocardiogram 2D Echocardiogram was not performed. The patient refused. He requested to speak to his daughter but the RN said she was at the dentist. The patient is refusing all treatments and test.  Arvil Chaco 10/09/2015, 11:25 AM

## 2015-10-09 NOTE — Progress Notes (Addendum)
[facility] PROGRESS NOTE    Progress Note   [name]:[id number] DOB: May 11, 1928 DOA: 09/30/2015 PCP: Martha Clan, MD   Brief Narrative:   [name] is an 82 y.o. male With past medical history of advanced COPD on home oxygen, diabetes mellitus, CAD atrial fibrillation on apixiban chronic systolic and diastolic heart failure comes to the ED complaining of cough and shortness of breath. She was found to be in acute COPD exacerbation placed on BiPAP and admitted to the hospital. She was started on IV Solu-Medrol steroids and PCCM was consulted. Patient  Cannot tolerate BiPAP when necessary.  Assessment/Plan:   Acute-on-chronic respiratory failure (HCC) Due to acute COPD exacerbation and Strep bronchitis - Cont IV Solu-Medrol (wean) and inhalers. - has had 7 days of Ceftriaxone- will d/c today - Cont morphine PRN- refusing BiPAP  Chronic, systolic and diastolic heart failure: Seems to be euvolemic, continue current regimen. - refusing to have ECHO done today  CAD: Asymptomatic continue medical management.  Atrial Fibrillation with RVR CHA2DS2-VASc Score is 6. Patient is on Eliquis. Cardiology on board, appreciate assistance  Controlled diabetes mellitus type 2: A1c was 6.7.  Acute confusional state/delirium: Cont Haldol when necessary when necessary.  BPH: Continue Flomax and Proscar  Malnutrition of moderate degree  DVT Prophylaxis - Lovenox ordered.  Family Communication: daughter Disposition Plan: home in 2-3 days if his dyspnea improves Code Status:     Code Status Orders        Start     Ordered   09/30/15 0534  Do not attempt resuscitation (DNR)   Continuous    Question Answer Comment  In the event of cardiac or respiratory ARREST Do not call a "code blue"   In the event of cardiac or respiratory ARREST Do not perform Intubation, CPR, defibrillation or ACLS   In the event of cardiac or respiratory ARREST Use medication by any  route, position, wound care, and other measures to relive pain and suffering. May use oxygen, suction and manual treatment of airway obstruction as needed for comfort.      09/30/15 0534    Code Status History    Date Active Date Inactive Code Status Order ID Comments User Context   03/27/2014 10:53 PM 03/29/2014  2:51 PM DNR [id number]  Alysia Penna, MD Inpatient   12/31/2013  5:02 PM 01/06/2014  6:19 PM DNR [id number]  Marrian Salvage, MD ED   09/09/2013  4:03 PM 09/17/2013  2:54 PM DNR [id number]  Hoyle Sauer, MD Inpatient   07/09/2013  7:51 PM 07/13/2013  1:34 PM DNR [id number]  Jarome Matin, MD Inpatient   07/09/2013  1:18 PM 07/09/2013  7:51 PM Full Code [id number]  Jarome Matin, MD ED   09/28/2012  7:29 AM 10/01/2012  6:12 PM DNR [id number]  Kari Baars, MD Inpatient   04/04/2012  1:02 PM 04/14/2012  2:29 PM DNR [id number]  Jarome Matin, MD ED   04/04/2012 12:57 PM 04/04/2012  1:02 PM DNR [id number]  Jarome Matin, MD ED   03/10/2012  5:58 PM 03/19/2012  4:40 PM DNR [id number]  Tammy Sours, RN Inpatient   11/13/2011 11:11 AM 11/15/2011  8:15 PM DNR [id number]  Lorinda Creed, NP Inpatient   11/08/2011  3:03 PM 11/13/2011 11:10 AM Partial Code [id number]  Kalman Shan, MD Inpatient   11/07/2011  4:13 PM 11/08/2011  3:03 PM Full Code [id number]  Theora Gianotti, RN Inpatient    Advance Directive Documentation  Most Recent Value   Type of Advance Directive  Healthcare Power of Attorney, Living will   Pre-existing out of facility DNR order (yellow form or pink MOST form)     "MOST" Form in Place?          IV Access:    Peripheral IV   Procedures and diagnostic studies:   No results found.   Medical Consultants:    None.  Anti-Infectives:   Anti-infectives    Start     Dose/Rate Route Frequency Ordered Stop   10/02/15 1000  cefTRIAXone (ROCEPHIN) 1 g in dextrose 5 % 50 mL IVPB     1 g 100 mL/hr over 30 Minutes Intravenous Every 24 hours 10/02/15 0912     10/01/15 1000   azithromycin (ZITHROMAX) tablet 250 mg  Status:  Discontinued     250 mg Oral Daily 09/30/15 0530 10/02/15 0920   09/30/15 1000  azithromycin (ZITHROMAX) tablet 500 mg     500 mg Oral Daily 09/30/15 0530 09/30/15 0944      Subjective:    [name] feels short of breath. No cough. No nausea or vomiting.   Objective:    Filed Vitals:   10/09/15 0020 10/09/15 0405 10/09/15 0412 10/09/15 0737  BP:   110/80   Pulse: 88 104 112   Temp:   97.5 F (36.4 C)   TempSrc:   Axillary   Resp: Height:      Weight:   77.248 kg (170 lb 4.8 oz)   SpO2: 98% 99% 100% 94%    Intake/Output Summary (Last 24 hours) at 10/09/15 1245 Last data filed at 10/09/15 0900  Gross per 24 hour  Intake    490 ml  Output    450 ml  Net     40 ml   Filed Weights   10/08/15 0411 10/08/15 1732 10/09/15 0412  Weight: 78.7 kg (173 lb 8 oz) 78.472 kg (173 lb) 77.248 kg (170 lb 4.8 oz)    Exam: Gen:  Awake alert and oriented 3 Cardiovascular:  RRR. Chest and lungs:   Mild bilateral rhonchi, no wheeze, no resp distress Abdomen:  Abdomen soft, NT/ND, + BS Extremities:  No edema.   Data Reviewed:    Labs: Basic Metabolic Panel:  Recent Labs Lab 10/04/15 0357 10/05/15 0400 10/06/15 0800 10/07/15 0338 10/08/15 0339  NA 141 138 135 138 137  K 4.3 4.3 4.4 4.5 4.6  CL 98* 91* 90* 92* 91*  CO2 34* 38* 35* 35* 36*  GLUCOSE 237* 282* 211* 203* 194*  BUN 52* 54* 43* 45* 42*  CREATININE 1.23 1.13 0.98 1.06 0.96  CALCIUM 9.4 8.7* 8.7* 9.1 8.8*   GFR Estimated Creatinine Clearance: 56.3 mL/min (by C-G formula based on Cr of 0.96). Liver Function Tests: No results for input(s): AST, ALT, ALKPHOS, BILITOT, PROT, ALBUMIN in the last 168 hours. No results for input(s): LIPASE, AMYLASE in the last 168 hours. No results for input(s): AMMONIA in the last 168 hours. Coagulation profile No results for input(s): INR, PROTIME in the last 168 hours.  CBC:  Recent Labs Lab 10/03/15 0333  10/04/15 0357  WBC 18.2* 14.2*  HGB 15.2 15.1  HCT 50.4 49.0  MCV 99.8 98.4  PLT 162 161   Cardiac Enzymes: No results for input(s): CKTOTAL, CKMB, CKMBINDEX, TROPONINI in the last 168 hours. BNP (last 3 results) No results for input(s): PROBNP in the last 8760 hours. CBG:  Recent Labs Lab 10/08/15  0809 10/08/15 1142 10/08/15 1637 10/08/15 2310 10/09/15 0718  GLUCAP 195* 280* 293* 143* 291*   D-Dimer: No results for input(s): DDIMER in the last 72 hours. Hgb A1c: No results for input(s): HGBA1C in the last 72 hours. Lipid Profile: No results for input(s): CHOL, HDL, LDLCALC, TRIG, CHOLHDL, LDLDIRECT in the last 72 hours. Thyroid function studies: No results for input(s): TSH, T4TOTAL, T3FREE, THYROIDAB in the last 72 hours.  Invalid input(s): FREET3 Anemia work up: No results for input(s): VITAMINB12, FOLATE, FERRITIN, TIBC, IRON, RETICCTPCT in the last 72 hours. Sepsis Labs:  Recent Labs Lab 10/03/15 0333 10/04/15 0357  WBC 18.2* 14.2*   Microbiology Recent Results (from the past 240 hour(s))  MRSA PCR Screening     Status: None   Collection Time: 09/30/15  9:40 PM  Result Value Ref Range Status   MRSA by PCR NEGATIVE NEGATIVE Final    Comment:        The GeneXpert MRSA Assay (FDA approved for NASAL specimens only), is one component of a comprehensive MRSA colonization surveillance program. It is not intended to diagnose MRSA infection nor to guide or monitor treatment for MRSA infections.   Culture, respiratory (NON-Expectorated)     Status: None   Collection Time: 10/01/15  1:30 PM  Result Value Ref Range Status   Specimen Description SPUTUM  Final   Special Requests NONE  Final   Gram Stain   Final    ABUNDANT WBC PRESENT,BOTH PMN AND MONONUCLEAR NO SQUAMOUS EPITHELIAL CELLS SEEN FEW GRAM POSITIVE COCCI IN CLUSTERS Performed at [facility]    Culture   Final    NORMAL OROPHARYNGEAL FLORA Performed at [facility]     Report Status 10/03/2015 FINAL  Final  Culture, expectorated sputum-assessment     Status: None   Collection Time: 10/01/15  1:32 PM  Result Value Ref Range Status   Specimen Description SPUTUM  Final   Special Requests NONE  Final   Sputum evaluation   Final    THIS SPECIMEN IS ACCEPTABLE. RESPIRATORY CULTURE REPORT TO FOLLOW.   Report Status 10/01/2015 FINAL  Final     Medications:   . antiseptic oral rinse  7 mL Mouth Rinse q12n4p  . apixaban  5 mg Oral BID  . bisoprolol  10 mg Oral Daily  . budesonide (PULMICORT) nebulizer solution  0.5 mg Nebulization BID  . cefTRIAXone (ROCEPHIN)  IV  1 g Intravenous Q24H  . chlorhexidine  15 mL Mouth Rinse BID  . dextromethorphan-guaiFENesin  1 tablet Oral BID  . digoxin  0.125 mg Oral Daily  . diltiazem  360 mg Oral Daily  . feeding supplement (ENSURE ENLIVE)  237 mL Oral BID BM  . finasteride  5 mg Oral Daily  . haloperidol lactate  3 mg Intravenous Q6H  . insulin aspart  0-20 Units Subcutaneous TID WC  . insulin aspart  0-5 Units Subcutaneous QHS  . insulin glargine  10 Units Subcutaneous BID  . ipratropium  0.5 mg Nebulization Q4H  . levalbuterol  0.63 mg Nebulization 6 times per day  . methylPREDNISolone (SOLU-MEDROL) injection  60 mg Intravenous Q6H  . rosuvastatin  40 mg Oral Daily  . sodium chloride  3 mL Intravenous Q12H  . tamsulosin  0.4 mg Oral BID   Continuous Infusions:   Time spent: 25 min   LOS: 9 days   Calvert Cantor, MD  [facility] Pager [phone]  *Please refer to [email or web address], password [email or web address] to get updated schedule on who  will round on this patient, as hospitalists switch teams weekly. If 7PM-7AM, please contact night-coverage at [email or web address], password [email or web address] for any overnight needs.  10/09/2015, 12:45 PM

## 2015-10-09 NOTE — Progress Notes (Signed)
PCCM PROGRESS NOTE  ADMISSION DATE: 09/30/2015 CONSULT DATE: 09/30/2015 REFERRING PROVIDER: Dr. Rito Ehrlich  CC: Short of breath  CULTURES: 1/14 Influenza >> negative 1/14 Pneumococcal Ag >> Positive 1/15 Sputum >> oral flora  ANTIBIOTICS: 1/14 Zithromax >> 1/16 1/16 Rocephin >>  STUDIES:  EVENTS: 1/14 Admit 1/16 Obtunded, stuck on BiPAP 1/17 Mental status improved 1/18 Back on BiPAP; cardiology consulted for a fib 1/20 - Didn't use BiPAP last night.  Still feels weak, but breathing improved.    SUBJECTIVE/OVERNIGHT/INTERVAL HX 10/09/2015- feels miserable. Reports lot of dyspnea. Does not remember me as his regular pulmonary. Says he saw me last 3 years ago. Does not remember office visit 09/27/15. RN report hallucinations and dysopnea; 1 dose haldol last night helped last night. Refuses BiPAP   VITAL SIGNS: BP 110/80 mmHg  Pulse 112  Temp(Src) 97.5 F (36.4 C) (Axillary)  Resp 22  Ht  (1.753 m)  Wt 77.248 kg (170 lb 4.8 oz)  BMI 25.14 kg/m2  SpO2 94%  INTAKE/OUTPUT: I/O last 3 completed shifts: In: 1130 [P.O.:870; Other:210; IV Piggyback:50] Out: 1275 [Urine:1275]  General: looks dramatically more deconditioned than when I saw him 09/27/15 HEENT: no sinus tenderness Cardiac: irregular, no murmur Chest: prolonged exhalation, no wheeze. Visible dyspnea .Abd: soft, non tender Ext: ankle edema Neuro: follows commands but CONFUSED Skin: no rashes  PULMONARY No results for input(s): PHART, PCO2ART, PO2ART, HCO3, TCO2, O2SAT in the last 168 hours.  Invalid input(s): PCO2, PO2  CBC  Recent Labs Lab 10/03/15 0333 10/04/15 0357  HGB 15.2 15.1  HCT 50.4 49.0  WBC 18.2* 14.2*  PLT 162 161    COAGULATION No results for input(s): INR in the last 168 hours.  CARDIAC  No results for input(s): TROPONINI in the last 168 hours. No results for input(s): PROBNP in the last 168 hours.   CHEMISTRY  Recent Labs Lab 10/04/15 0357 10/05/15 0400  10/06/15 0800 10/07/15 0338 10/08/15 0339  NA 141 138 135 138 137  K 4.3 4.3 4.4 4.5 4.6  CL 98* 91* 90* 92* 91*  CO2 34* 38* 35* 35* 36*  GLUCOSE 237* 282* 211* 203* 194*  BUN 52* 54* 43* 45* 42*  CREATININE 1.23 1.13 0.98 1.06 0.96  CALCIUM 9.4 8.7* 8.7* 9.1 8.8*   Estimated Creatinine Clearance: 56.3 mL/min (by C-G formula based on Cr of 0.96).   LIVER No results for input(s): AST, ALT, ALKPHOS, BILITOT, PROT, ALBUMIN, INR in the last 168 hours.   INFECTIOUS No results for input(s): LATICACIDVEN, PROCALCITON in the last 168 hours.   ENDOCRINE CBG (last 3)   Recent Labs  10/08/15 1637 10/08/15 2310 10/09/15 0718  GLUCAP 293* 143* 291*         IMAGING x48h  - image(s) personally visualized  -   highlighted in bold No results found.   Anti-infectives    Start     Dose/Rate Route Frequency Ordered Stop   10/02/15 1000  cefTRIAXone (ROCEPHIN) 1 g in dextrose 5 % 50 mL IVPB     1 g 100 mL/hr over 30 Minutes Intravenous Every 24 hours 10/02/15 0912     10/01/15 1000  azithromycin (ZITHROMAX) tablet 250 mg  Status:  Discontinued     250 mg Oral Daily 09/30/15 0530 10/02/15 0920   09/30/15 1000  azithromycin (ZITHROMAX) tablet 500 mg     500 mg Oral Daily 09/30/15 0530 09/30/15 0944       DISCUSSION: 82 yo male former smoker with cough, dyspnea, wheeze, hypoxia  from AECOPD and Pneumococcal tracheobronchitis.  He has hx of advanced COPD on chronic prednisone, chronic systolic/diastolic CHF with EF 35 to 40% from Oct 2014, Chronic a fib on eliquis, CAD, HTN, HLD, s/p PM, GERD, DM.  He is DNR/DNI.  ASSESSMENT/PLAN:  Acute hypoxic/hypercapnic respiratory failure. Plan: - oxygen to keep SpO2 88 to 95% - BiPAP prn >> ->REFUSES - prn morphine for dyspnea  AECOPD. Pneumococcal tracheobronchitis. Plan: - pulmicort, xopenex, ipratropium - continue solumedrol - rocephin as abve - f/u CXR intermittently  Chronic combined CHF. Hx of A fib, CAD, HTN,  HLD. Plan: - per primary team and cardiology - repeat echo orderd 10/09/2015: - last 2014  Acute encephalopathy likely 2nd to hypercapnia and hospitalization and age and prior hx  - ongoing and hallucinating Plan: - monitor mental status - start scheduled low dose haldol 10/09/15 - EKG QTC 124!7   AKI >> improved. Plan: - monitor renal fx while getting lasix  Goals of Care >> DNR/DNI. Likely needs SNF rehab. He is similar to a few years ago when we discussed hospice etc., but he bounced back . He is very decodntioned. IF he declines will need palliation   .   Future Appointments Date Time Provider Department Center  10/09/2015 1:00 PM [facility]- ECHO [facility]     Dr. Kalman Shan, M.D., [facility].C.P Pulmonary and Critical Care Medicine Staff Physician [facility] Pager: [phone], If no answer or between  15:00h - 7:00h: call [phone]  10/09/2015 9:49 AM

## 2015-10-09 NOTE — Progress Notes (Signed)
Pt refuses ABG

## 2015-10-09 NOTE — Progress Notes (Signed)
Pt refused oral medications this morning and 2D echo. Pt states he does not want anything. MD notified.

## 2015-10-09 NOTE — Progress Notes (Signed)
ABG results called to MD.  

## 2015-10-09 NOTE — Care Management Important Message (Signed)
Important Message  Patient Details  Name: YIDEL TEUSCHER MRN: [id number] Date of Birth: March 11, 1928   Medicare Important Message Given:  Yes    Haskell Flirt 10/09/2015, 1:08 PMImportant Message  Patient Details  Name: KAISON MCPARLAND MRN: [id number] Date of Birth: 1928-02-07   Medicare Important Message Given:  Yes    Haskell Flirt 10/09/2015, 1:08 PM

## 2015-10-09 NOTE — Progress Notes (Signed)
Patient Profile: 82 year old male with hx of COPD, chronic a fib, v. Tach, CAD (STEMI in 2011 and 07/2011 cath with PCI with DES to LCX for total occl. Chronic total occl of RCA with Lt->rt collaterals, nonobstructive LAD stenosis) Hx of ICM -CHF with EF 25% 2012 and hx. symptomatic brady requiring pacemaker-2012 St. Jude device. EF 35-40% on last echo 06/2013. Admitted 09/30/15 for cough and SOB, found to be in atrial fibrillation w/ RVR. IM treating for acute COPD exacerbation.   Subjective: Still feels SOB. Still on [place].   Objective: Vital signs in last 24 hours: Temp:  [97.5 F (36.4 C)-97.8 F (36.6 C)] 97.5 F (36.4 C) (01/23 0412) Pulse Rate:  [55-118] 112 (01/23 0412) Resp:  [20-30] 22 (01/23 0412) BP: (109-153)/(64-87) 110/80 mmHg (01/23 0412) SpO2:  [94 %-100 %] 94 % (01/23 0737) Weight:  [170 lb 4.8 oz (77.248 kg)-173 lb (78.472 kg)] 170 lb 4.8 oz (77.248 kg) (01/23 0412) Last BM Date: 10/02/15  Intake/Output from previous day: 01/22 0701 - 01/23 0700 In: 900 [P.O.:750; IV Piggyback:50] Out: 450 [Urine:450] Intake/Output this shift: Total I/O In: 120 [P.O.:120] Out: -   Medications Current Facility-Administered Medications  Medication Dose Route Frequency Provider Last Rate Last Dose  . acetaminophen (TYLENOL) tablet 650 mg  650 mg Oral Q6H PRN Lorretta Harp, MD       Or  . acetaminophen (TYLENOL) suppository 650 mg  650 mg Rectal Q6H PRN Lorretta Harp, MD      . antiseptic oral rinse (CPC / CETYLPYRIDINIUM CHLORIDE 0.05%) solution 7 mL  7 mL Mouth Rinse q12n4p Hollice Espy, MD   7 mL at 10/08/15 1650  . apixaban (ELIQUIS) tablet 5 mg  5 mg Oral BID Coralyn Helling, MD   5 mg at 10/08/15 2138  . bisoprolol (ZEBETA) tablet 10 mg  10 mg Oral Daily Coralyn Helling, MD   10 mg at 10/08/15 1013  . budesonide (PULMICORT) nebulizer solution 0.5 mg  0.5 mg Nebulization BID Coralyn Helling, MD   0.5 mg at 10/09/15 0744  . cefTRIAXone (ROCEPHIN) 1 g in dextrose 5 % 50 mL IVPB  1 g  Intravenous Q24H Coralyn Helling, MD   1 g at 10/08/15 1015  . chlorhexidine (PERIDEX) 0.12 % solution 15 mL  15 mL Mouth Rinse BID Hollice Espy, MD   15 mL at 10/08/15 2138  . digoxin (LANOXIN) tablet 0.125 mg  0.125 mg Oral Daily Coralyn Helling, MD   0.125 mg at 10/08/15 1011  . diltiazem (CARDIZEM CD) 24 hr capsule 360 mg  360 mg Oral Daily Mihai Croitoru, MD   360 mg at 10/08/15 1010  . feeding supplement (ENSURE ENLIVE) (ENSURE ENLIVE) liquid 237 mL  237 mL Oral BID BM Dionne Ano Ward, RD   237 mL at 10/08/15 1400  . finasteride (PROSCAR) tablet 5 mg  5 mg Oral Daily Coralyn Helling, MD   5 mg at 10/08/15 1014  . fluticasone (FLONASE) 50 MCG/ACT nasal spray 2 spray  2 spray Each Nare Daily PRN Lorretta Harp, MD      . haloperidol lactate (HALDOL) injection 1 mg  1 mg Intravenous Q6H PRN Marinda Elk, MD   1 mg at 10/08/15 2142  . insulin aspart (novoLOG) injection 0-20 Units  0-20 Units Subcutaneous TID WC Coralyn Helling, MD   11 Units at 10/09/15 0851  . insulin aspart (novoLOG) injection 0-5 Units  0-5 Units Subcutaneous QHS Coralyn Helling, MD   4 Units  at 10/07/15 2259  . insulin glargine (LANTUS) injection 10 Units  10 Units Subcutaneous BID Marinda Elk, MD   10 Units at 10/08/15 2310  . ipratropium (ATROVENT) nebulizer solution 0.5 mg  0.5 mg Nebulization Q4H Marinda Elk, MD   0.5 mg at 10/09/15 0737  . levalbuterol (XOPENEX) nebulizer solution 0.63 mg  0.63 mg Nebulization 6 times per day Marinda Elk, MD   0.63 mg at 10/09/15 0737  . methylPREDNISolone sodium succinate (SOLU-MEDROL) 125 mg/2 mL injection 60 mg  60 mg Intravenous Q6H Marinda Elk, MD   60 mg at 10/09/15 0522  . metoprolol (LOPRESSOR) injection 5 mg  5 mg Intravenous Q4H PRN Marinda Elk, MD   5 mg at 10/05/15 0006  . morphine 2 MG/ML injection 2 mg  2 mg Intravenous Q2H PRN Marinda Elk, MD   2 mg at 10/09/15 0042  . nitroGLYCERIN (NITROSTAT) SL tablet 0.4 mg  0.4 mg Sublingual Q5 min PRN  Lorretta Harp, MD      . ondansetron [facility]) injection 4 mg  4 mg Intravenous Q6H PRN Lorretta Harp, MD   4 mg at 10/05/15 1957  . rosuvastatin (CRESTOR) tablet 40 mg  40 mg Oral Daily Coralyn Helling, MD   40 mg at 10/08/15 1710  . sodium chloride 0.9 % injection 3 mL  3 mL Intravenous Q12H Lorretta Harp, MD   3 mL at 10/08/15 2138  . tamsulosin (FLOMAX) capsule 0.4 mg  0.4 mg Oral BID Coralyn Helling, MD   0.4 mg at 10/08/15 2138    PE: General appearance: alert, cooperative, no distress and using [place]: no carotid bruit and no JVD Lungs: bilateral diffuse wheezing Heart: irregularly irregular rhythm and tachy rate Extremities: no LEE Pulses: 2+ and symmetric Skin: warm and dry Neurologic: Grossly normal  Lab Results:  No results for input(s): WBC, HGB, HCT, PLT in the last 72 hours. BMET  Recent Labs  10/07/15 0338 10/08/15 0339  NA 138 137  K 4.5 4.6  CL 92* 91*  CO2 35* 36*  GLUCOSE 203* 194*  BUN 45* 42*  CREATININE 1.06 0.96  CALCIUM 9.1 8.8*     Assessment/Plan  Principal Problem:   Acute-on-chronic respiratory failure (HCC) Active Problems:   Abdominal aortic aneurysm (HCC)   Emphysema/COPD (HCC)   Pacemaker-St.Jude   CAD (coronary artery disease)   HLD (hyperlipidemia)   COPD exacerbation (HCC)   Permanent atrial fibrillation (HCC)   Depression   Chronic combined systolic and diastolic congestive heart failure (HCC)   COPD, severe (HCC)   GERD (gastroesophageal reflux disease)   BPH (benign prostatic hyperplasia)   Malnutrition of moderate degree   Atrial fibrillation with RVR (HCC)   1. Atrial fib with RVR Tolerating higher dose diltiazem but rate still poorly controlled, in the low 100s-110s at rest. Little room for additional conventional AV blocking meds. He was on amiodarone in the past and tolerated it well, but it was stopped due to concern for possible additional pulmonary deterioration. Amiodarone remains an option for temporary rate control but would need  to use caution given use of haldol for delirium, as the to can prolong QT interval. Agree with Xopenex vs albuterol as this will cause less reflex tachycardia. On Eliquis for CHA2DS2-VASc Score 6.   2. CAD with hx MI; neg troponin, no chest pain.  3. CHF (combined systolic and diastolic, chronic) - no HF on Xray, BNP slightly elevated from baseline. Appears euvolemic - but may  need to re-institute home diuretic given reduced ED & persistent Afib RVR  4. COPD with acute exacerbation- small improvement daily. IM managing.   5. Delirium per IM. Patient ordered to get haladol. Will need to monitor QT/QTc.      LOS: 9 days    Brittainy M. Delmer Islam 10/09/2015 9:43 AM  I saw and evaluated the patient this afternoon after Boyce Medici, PA-C. He was in the process of abdomen echocardiogram performed that he previously refused.  Results are as follows: Study Conclusions  - Left ventricle: The cavity size was moderately dilated. Systolic function was severely reduced. The estimated ejection fraction was in the range of 20% to 25%. Severe, diffuse hypokinesis. The study is not technically sufficient to allow evaluation of LV diastolic function. - Ventricular septum: Septal motion showed abnormal function and dyssynergy. - Mitral valve: Transvalvular velocity was within the normal range. There was no evidence for stenosis. There was moderate regurgitation. - Left atrium: The atrium was severely dilated.  Right ventricle: The cavity size was normal. Wall thickness was normal. Systolic function was normal. - Right atrium: The atrium was severely dilated. - Atrial septum: No defect or patent foramen ovale was identified by color flow Doppler. - Tricuspid valve: There was mild regurgitation. - Pulmonary arteries: Systolic pressure was mildly increased. PA peak pressure: 36 mm Hg (S). - Inferior vena cava: The vessel was dilated. The respirophasic diameter changes were blunted (< 50%),  consistent with elevated central venous pressure. ___  It would appear that he has had a reduction in EF, this is probably related to him currently being in nature fibrillation with wide complex QRS. He is presented mostly with COPD exacerbation, but now probably has some component of heart failure based on his persistent A. fib with RVR.  He is on high dose diltiazem, beta blocker and digoxin, and still has relatively poor rate control. I think some of this is indeed related to his ongoing rest or status. Still reluctant to consider amiodarone, however this may be an option for the short-term to control his rate. This would not be a good long-term regimen. I am concerned the knee started to develop some heart failure symptoms and will likely need Lasix started back. Is very difficult to tell if he has any volume overload, simply because he is not moving any air on lung exam, and has cannon A waves from A. fib for jugular veins. He does have a little bit of edema.   Plan: Just continue current rate control for now. I would prefer not to use diltiazem with his reduced ejection fraction, but for now we are limited. For discharge, I think we may need to consider discussing options with electrophysiology for for an antiarrhythmic choice.  He remains on ELIQUIS for coagulation with severely elevated risk This patients CHA2DS2-VASc Score and unadjusted Ischemic Stroke Rate (% per year) is equal to 9.7 % stroke rate/year from a score of 6  Above score calculated as 1 point each if present [CHF, HTN, DM, Vascular=MI/PAD/Aortic Plaque, Age if 65-74, or Male] Above score calculated as 2 points each if present [Age > 75, or Stroke/TIA/TE]  If he becomes unstable - could consider cardioversion.    He has been more confused & delirious this hospital stay as opposed to previous episodes -== was refusing meds, tests & treatments. Goals of care discussions would likely be prudent if he continues to have  such slow progression.   Marykay Lex, M.D., M.S. Interventional Cardiologist  Pager # [phone] Phone # [phone] [address] [place], [place] [address]

## 2015-10-10 ENCOUNTER — Inpatient Hospital Stay (HOSPITAL_COMMUNITY): Payer: Medicare Other

## 2015-10-10 DIAGNOSIS — J9611 Chronic respiratory failure with hypoxia: Secondary | ICD-10-CM

## 2015-10-10 DIAGNOSIS — J9620 Acute and chronic respiratory failure, unspecified whether with hypoxia or hypercapnia: Secondary | ICD-10-CM

## 2015-10-10 DIAGNOSIS — E118 Type 2 diabetes mellitus with unspecified complications: Secondary | ICD-10-CM

## 2015-10-10 DIAGNOSIS — Z7901 Long term (current) use of anticoagulants: Secondary | ICD-10-CM

## 2015-10-10 DIAGNOSIS — Z794 Long term (current) use of insulin: Secondary | ICD-10-CM

## 2015-10-10 DIAGNOSIS — Z9861 Coronary angioplasty status: Secondary | ICD-10-CM

## 2015-10-10 LAB — GLUCOSE, CAPILLARY
GLUCOSE-CAPILLARY: 296 mg/dL — AB (ref 65–99)
Glucose-Capillary: 179 mg/dL — ABNORMAL HIGH (ref 65–99)
Glucose-Capillary: 250 mg/dL — ABNORMAL HIGH (ref 65–99)
Glucose-Capillary: 206 mg/dL — ABNORMAL HIGH (ref 65–99)

## 2015-10-10 LAB — MAGNESIUM: MAGNESIUM: 2.3 mg/dL (ref 1.7–2.4)

## 2015-10-10 LAB — PHOSPHORUS: PHOSPHORUS: 2.5 mg/dL (ref 2.5–4.6)

## 2015-10-10 MED ORDER — METHYLPREDNISOLONE SODIUM SUCC 125 MG IJ SOLR
60.0000 mg | Freq: Two times a day (BID) | INTRAMUSCULAR | Status: DC
  Administered 2015-10-10 – 2015-10-13 (×6): 60 mg via INTRAVENOUS
  Filled 2015-10-10 (×6): qty 2

## 2015-10-10 MED ORDER — FUROSEMIDE 20 MG PO TABS
20.0000 mg | ORAL_TABLET | Freq: Every day | ORAL | Status: DC
  Administered 2015-10-10 – 2015-10-17 (×8): 20 mg via ORAL
  Filled 2015-10-10 (×8): qty 1

## 2015-10-10 MED ORDER — BISOPROLOL FUMARATE 5 MG PO TABS
20.0000 mg | ORAL_TABLET | Freq: Every day | ORAL | Status: DC
  Administered 2015-10-11 – 2015-10-14 (×4): 20 mg via ORAL
  Filled 2015-10-10 (×4): qty 4

## 2015-10-10 MED ORDER — DILTIAZEM HCL ER COATED BEADS 180 MG PO CP24
300.0000 mg | ORAL_CAPSULE | Freq: Every day | ORAL | Status: DC
  Administered 2015-10-11: 300 mg via ORAL
  Filled 2015-10-10: qty 1

## 2015-10-10 MED ORDER — INSULIN GLARGINE 100 UNIT/ML ~~LOC~~ SOLN
20.0000 [IU] | Freq: Two times a day (BID) | SUBCUTANEOUS | Status: DC
  Administered 2015-10-10 – 2015-10-16 (×11): 20 [IU] via SUBCUTANEOUS
  Filled 2015-10-10 (×12): qty 0.2

## 2015-10-10 MED ORDER — PREDNISONE 20 MG PO TABS: 60.0000 mg | ORAL_TABLET | Freq: Every day | ORAL | Status: DC

## 2015-10-10 NOTE — Progress Notes (Signed)
Patient Profile: 82 year old male with hx of severe COPD on chronic O2,  chronic a fib-on Eliquis,  CAD (STEMI in 2011 and 07/2011 cath with PCI with DES to LCX for total occl. Chronic total occl of RCA with Lt->rt collaterals, nonobstructive LAD stenosis) Hx of ICM -CHF with EF 25% (echo 10/09/15), St Jude PTVDP, NIDDM, HLD, and BPH.  Admitted 09/30/15 for cough and SOB, found to be in atrial fibrillation w/ RVR. He has chronic IVCD with wide QRS. IM treating for acute COPD exacerbation.   Subjective: Still feels SOB. Still on [place] O2  Objective: Vital signs in last 24 hours: Temp:  [97.5 F (36.4 C)-97.8 F (36.6 C)] 97.5 F (36.4 C) (01/24 0502) Pulse Rate:  [104-112] 105 (01/24 1014) Resp:  [22] 22 (01/24 0502) BP: (116-126)/(78-88) 125/82 mmHg (01/24 1013) SpO2:  [94 %-100 %] 95 % (01/24 0812) Weight:  [171 lb 8.3 oz (77.8 kg)] 171 lb 8.3 oz (77.8 kg) (01/24 0502) Last BM Date: 10/02/15  Intake/Output from previous day: 01/23 0701 - 01/24 0700 In: 600 [P.O.:600] Out: 1400 [Urine:1400] Intake/Output this shift:    Medications Current Facility-Administered Medications  Medication Dose Route Frequency Provider Last Rate Last Dose  . acetaminophen (TYLENOL) tablet 650 mg  650 mg Oral Q6H PRN Lorretta Harp, MD       Or  . acetaminophen (TYLENOL) suppository 650 mg  650 mg Rectal Q6H PRN Lorretta Harp, MD      . antiseptic oral rinse (CPC / CETYLPYRIDINIUM CHLORIDE 0.05%) solution 7 mL  7 mL Mouth Rinse q12n4p Hollice Espy, MD   7 mL at 10/09/15 1728  . apixaban (ELIQUIS) tablet 5 mg  5 mg Oral BID Coralyn Helling, MD   5 mg at 10/10/15 1013  . bisoprolol (ZEBETA) tablet 10 mg  10 mg Oral Daily Coralyn Helling, MD   10 mg at 10/10/15 1013  . budesonide (PULMICORT) nebulizer solution 0.5 mg  0.5 mg Nebulization BID Coralyn Helling, MD   0.5 mg at 10/10/15 0818  . chlorhexidine (PERIDEX) 0.12 % solution 15 mL  15 mL Mouth Rinse BID Hollice Espy, MD   15 mL at 10/10/15 1012  .  dextromethorphan-guaiFENesin (MUCINEX DM) 30-600 MG per 12 hr tablet 1 tablet  1 tablet Oral BID Calvert Cantor, MD   1 tablet at 10/10/15 1014  . digoxin (LANOXIN) tablet 0.125 mg  0.125 mg Oral Daily Coralyn Helling, MD   0.125 mg at 10/10/15 1014  . diltiazem (CARDIZEM CD) 24 hr capsule 360 mg  360 mg Oral Daily Mihai Croitoru, MD   360 mg at 10/10/15 1013  . feeding supplement (ENSURE ENLIVE) (ENSURE ENLIVE) liquid 237 mL  237 mL Oral BID BM Dionne Ano Ward, RD   237 mL at 10/10/15 1000  . finasteride (PROSCAR) tablet 5 mg  5 mg Oral Daily Coralyn Helling, MD   5 mg at 10/10/15 1013  . fluticasone (FLONASE) 50 MCG/ACT nasal spray 2 spray  2 spray Each Nare Daily PRN Lorretta Harp, MD      . haloperidol lactate (HALDOL) injection 3 mg  3 mg Intravenous Q6H Calvert Cantor, MD   3 mg at 10/10/15 0509  . insulin aspart (novoLOG) injection 0-20 Units  0-20 Units Subcutaneous TID WC Coralyn Helling, MD   4 Units at 10/10/15 0801  . insulin aspart (novoLOG) injection 0-5 Units  0-5 Units Subcutaneous QHS Coralyn Helling, MD   4 Units at 10/07/15 2259  . insulin glargine (  LANTUS) injection 10 Units  10 Units Subcutaneous BID Marinda Elk, MD   10 Units at 10/10/15 1013  . ipratropium (ATROVENT) nebulizer solution 0.5 mg  0.5 mg Nebulization Q4H Marinda Elk, MD   0.5 mg at 10/10/15 0810  . levalbuterol (XOPENEX) nebulizer solution 0.63 mg  0.63 mg Nebulization 6 times per day Marinda Elk, MD   0.63 mg at 10/10/15 0810  . methylPREDNISolone sodium succinate (SOLU-MEDROL) 125 mg/2 mL injection 60 mg  60 mg Intravenous Q12H Calvert Cantor, MD   60 mg at 10/09/15 2304  . metoprolol (LOPRESSOR) injection 5 mg  5 mg Intravenous Q4H PRN Marinda Elk, MD   5 mg at 10/05/15 0006  . morphine 2 MG/ML injection 2 mg  2 mg Intravenous Q2H PRN Marinda Elk, MD   2 mg at 10/09/15 2010  . nitroGLYCERIN (NITROSTAT) SL tablet 0.4 mg  0.4 mg Sublingual Q5 min PRN Lorretta Harp, MD      . ondansetron [facility] injection  4 mg  4 mg Intravenous Q6H PRN Lorretta Harp, MD   4 mg at 10/05/15 1957  . rosuvastatin (CRESTOR) tablet 40 mg  40 mg Oral Daily Coralyn Helling, MD   40 mg at 10/09/15 1722  . sodium chloride 0.9 % injection 3 mL  3 mL Intravenous Q12H Lorretta Harp, MD   3 mL at 10/10/15 1012  . tamsulosin (FLOMAX) capsule 0.4 mg  0.4 mg Oral BID Coralyn Helling, MD   0.4 mg at 10/10/15 1014    PE: General appearance: alert, cooperative, no distress and using [place]ck: no carotid bruit and no JVD Lungs: bilateral diffuse wheezing Heart: irregularly irregular rhythm and tachy rate Extremities: no LEE Pulses: 2+ and symmetric Skin: warm and dry Neurologic: Grossly normal  Lab Results:  No results for input(s): WBC, HGB, HCT, PLT in the last 72 hours. BMET  Recent Labs  10/08/15 0339  NA 137  K 4.6  CL 91*  CO2 36*  GLUCOSE 194*  BUN 42*  CREATININE 0.96  CALCIUM 8.8*     Assessment/Plan  Principal Problem:   Acute-on-chronic respiratory failure (HCC) Active Problems:   Chronic combined systolic and diastolic congestive heart failure (HCC)   COPD with acute exacerbation (HCC)   Atrial fibrillation with RVR (HCC)   Emphysema/COPD (HCC)   CAD S/P previous PCI   Type 2 diabetes mellitus (HCC)   Permanent atrial fibrillation (HCC)   Chronic anticoagulation   Chronic respiratory failure with hypoxia (HCC)   Pacemaker-St.Jude   Depression   GERD (gastroesophageal reflux disease)   BPH (benign prostatic hyperplasia)   Malnutrition of moderate degree   1. Atrial fib with RVR Tolerating higher dose diltiazem but rate still poorly controlled, in the low 100s-110s at rest. Little room for additional conventional AV blocking meds. He was on amiodarone in the past and tolerated it well, but it was stopped due to concern for possible additional pulmonary deterioration. Amiodarone remains an option for temporary rate control but would need to use caution given use of haldol for delirium, as the to can prolong  QT interval. Agree with Xopenex vs albuterol as this will cause less reflex tachycardia. On Eliquis for CHA2DS2-VASc Score 6.   2. CAD with hx MI; neg troponin, no chest pain.  3. CHF (combined systolic and diastolic, chronic) - no HF on Xray, BNP slightly elevated from baseline. Appears euvolemic - but may need to re-institute home diuretic given reduced ED & persistent Afib RVR.  I/O negative 4.5 L from adm, wgt down 10 lbs  4. COPD with acute exacerbation- small improvement daily. IM managing.   5. Delirium per IM. Patient ordered to get haladol.  QTc 485.      LOS: 10 days    Plan: Currently on Lanoxin, Diltiazem, and Bisoprolol for HR control, Eliquis for anticoagulation, not on Lasix. MD to review. COPD exacerbation per primary service.   Corine Shelter PA-C 10/10/2015 11:23 AM  I have seen, examined and evaluated the patient this evening along with Mr. Diona Fanti, [place]. After reviewing all the available data and chart,  I agree with his findings, examination as well as impression recommendations.  See detailed discussion from yesterday.  Very slow progression of respiratory improvement. Remains in A. fib with borderline control. EF showed definite reduction from last evaluation. Likely related to A. fib with bundle branch block. At this point, I think it would be beneficial to potentially consider switching from high dose diltiazem to high-dose beta blocker. For now I will increase bisoprolol to 20 mg and reduce carvedilol to 300 mg. He does appear to have some third spacing, but not overly volume overloaded. Would consider restarting oral diuretic.  He is on ELIQUIS phrenic regulation based on his elevated CHADS2VASC2 score as indicated yesterday.Marland Kitchen  He remains critically ill with very slow progression in the setting of having a large pneumonia and COPD exacerbation with existing severe cardiomyopathy leading to compounding COPD with CHF failure and A. Fib.  As mentioned yesterday, I  think goals of care talk is warranted. Unfortunately as he continues to have a slow progression, his likelihood of recovery diminishes.    Marykay Lex, M.D., M.S. Interventional Cardiologist   Pager # [phone] Phone # [phone] [address] [place], [place] [address]

## 2015-10-10 NOTE — Clinical Social Work Note (Signed)
Clinical Social Work Assessment  Patient Details  Name: [name] MRN: [id number] Date of Birth: 10/14/1927  Date of referral:  10/10/15               Reason for consult:  Facility Placement                Permission sought to share information with:  Facility Industrial/product designer granted to share information::  Yes, Verbal Permission Granted  Name::        Agency::     Relationship::     Contact Information:     Housing/Transportation Living arrangements for the past 2 months:  Independent Living Facility Source of Information:  Patient, Adult Children Patient Interpreter Needed:  None Criminal Activity/Legal Involvement Pertinent to Current Situation/Hospitalization:  No - Comment as needed Significant Relationships:  Adult Children Lives with:  Facility Resident Do you feel safe going back to the place where you live?  No Need for family participation in patient care:  Yes (Comment)  Care giving concerns:  CSW received consult for SNF placement.    Social Worker assessment / plan:  CSW reviewed PT evaluation recommending SNF - CSW spoke with patient's daughter, [name] who is agreeable with plan for SNF.   Employment status:  Retired Health and safety inspector:  Medicare PT Recommendations:  Skilled Nursing Facility Information / Referral to community resources:  Skilled Nursing Facility  Patient/Family's Response to care:  Patient's daughter informed CSW that patient had been to [facility] in the past and would prefer to return there. CSW awaiting response from [facility] re: bed availability.   Patient/Family's Understanding of and Emotional Response to Diagnosis, Current Treatment, and Prognosis:    Emotional Assessment Appearance:  Appears stated age Attitude/Demeanor/Rapport:    Affect (typically observed):    Orientation:    Alcohol / Substance use:    Psych involvement (Current and /or in the community):     Discharge Needs  Concerns  to be addressed:    Readmission within the last 30 days:    Current discharge risk:    Barriers to Discharge:      Arlyss Repress, LCSW 10/10/2015, 4:30 PM

## 2015-10-10 NOTE — NC FL2 (Signed)
[place] MEDICAID FL2 LEVEL OF CARE SCREENING TOOL     IDENTIFICATION  Patient Name: [name] Birthdate: 1927/12/25 Sex: male Admission Date (Current Location): 09/30/2015  [facility] and [place] Number:  Producer, television/film/video and Address:  [facility],  [address] [place], [place] [address]      Provider Number: [id number]  Attending Physician Name and Address:  Marinda Elk, MD  Relative Name and Phone Number:       Current Level of Care: Hospital Recommended Level of Care: Skilled Nursing Facility Prior Approval Number:    Date Approved/Denied:   PASRR Number: [id number]  Discharge Plan: SNF    Current Diagnoses: Patient Active Problem List   Diagnosis Date Noted  . Atrial fibrillation with RVR (HCC)   . Malnutrition of moderate degree 10/02/2015  . GERD (gastroesophageal reflux disease) 09/30/2015  . BPH (benign prostatic hyperplasia) 09/30/2015  . COPD, severe (HCC) 01/16/2015  . Chronic respiratory failure with hypoxia (HCC) 01/16/2015  . Post-nasal drainage 01/16/2015  . COLD (chronic obstructive lung disease) (HCC) 05/09/2014  . Atrial fibrillation (HCC) 10/06/2013  . Hypertension 10/06/2013  . COPD with acute exacerbation (HCC) 09/09/2013  . Ventricular tachycardia (HCC) 06/15/2013  . Cancer screening, for lung 12/12/2012  . Chronic combined systolic and diastolic congestive heart failure (HCC) 09/28/2012  . Leg weakness, bilateral 04/13/2012  . E-coli UTI 04/09/2012  . Hyponatremia 04/09/2012  . Chronic anticoagulation 04/04/2012    Class: Chronic  . Depression 03/16/2012    Class: Chronic  . Type 2 diabetes mellitus (HCC) 03/11/2012  . Chest pain 03/11/2012  . Permanent atrial fibrillation (HCC) 03/11/2012  . Hypoxemia 03/10/2012  . Bronchitis 03/10/2012  . Pulmonary edema 03/10/2012  . Acute-on-chronic respiratory failure (HCC) 11/15/2011  . Acute on chronic combined systolic and diastolic heart failure (HCC) 11/11/2011   . Spiritual distress 11/08/2011  . COPD exacerbation (HCC) 11/07/2011  . Dyspnea 08/27/2011  . HLD (hyperlipidemia) 08/27/2011  . NSTEMI (non-ST elevated myocardial infarction) (HCC) 08/15/2011  . CAD S/P previous PCI 08/15/2011  . Unstable angina (HCC) 08/13/2011  . Post-nasal drip 04/26/2011  . Pacemaker-St.Jude 04/02/2011  . CHF (congestive heart failure) (HCC) 12/26/2010  . Abdominal aortic aneurysm (HCC) 12/26/2010  . Emphysema/COPD (HCC) 12/26/2010    Orientation RESPIRATION BLADDER Height & Weight    Self, Time  Normal Continent  (175.3 cm) 171 lbs.  BEHAVIORAL SYMPTOMS/MOOD NEUROLOGICAL BOWEL NUTRITION STATUS      Continent Diet (Dys 3)  AMBULATORY STATUS COMMUNICATION OF NEEDS Skin   Limited Assist Verbally Normal                       Personal Care Assistance Level of Assistance  Bathing, Dressing Bathing Assistance: Limited assistance   Dressing Assistance: Limited assistance     Functional Limitations Info             SPECIAL CARE FACTORS FREQUENCY  PT (By licensed PT), OT (By licensed OT)     PT Frequency: 5 OT Frequency: 5            Contractures Contractures Info: Not present    Additional Factors Info  Code Status, Allergies Code Status Info: DNR Allergies Info: NKDA           Current Medications (10/10/2015):  This is the current hospital active medication list Current Facility-Administered Medications  Medication Dose Route Frequency Provider Last Rate Last Dose  . acetaminophen (TYLENOL) tablet 650 mg  650 mg Oral  Q6H PRN Lorretta Harp, MD       Or  . acetaminophen (TYLENOL) suppository 650 mg  650 mg Rectal Q6H PRN Lorretta Harp, MD      . antiseptic oral rinse (CPC / CETYLPYRIDINIUM CHLORIDE 0.05%) solution 7 mL  7 mL Mouth Rinse q12n4p Hollice Espy, MD   7 mL at 10/10/15 1600  . apixaban (ELIQUIS) tablet 5 mg  5 mg Oral BID Coralyn Helling, MD   5 mg at 10/10/15 1013  . [START ON 10/11/2015] bisoprolol (ZEBETA) tablet 20 mg   20 mg Oral Daily Marykay Lex, MD      . budesonide (PULMICORT) nebulizer solution 0.5 mg  0.5 mg Nebulization BID Coralyn Helling, MD   0.5 mg at 10/10/15 0818  . chlorhexidine (PERIDEX) 0.12 % solution 15 mL  15 mL Mouth Rinse BID Hollice Espy, MD   15 mL at 10/10/15 1012  . dextromethorphan-guaiFENesin (MUCINEX DM) 30-600 MG per 12 hr tablet 1 tablet  1 tablet Oral BID Calvert Cantor, MD   1 tablet at 10/10/15 1014  . digoxin (LANOXIN) tablet 0.125 mg  0.125 mg Oral Daily Coralyn Helling, MD   0.125 mg at 10/10/15 1014  . [START ON 10/11/2015] diltiazem (CARDIZEM CD) 24 hr capsule 300 mg  300 mg Oral Daily Marykay Lex, MD      . feeding supplement (ENSURE ENLIVE) (ENSURE ENLIVE) liquid 237 mL  237 mL Oral BID BM Dionne Ano Ward, RD   237 mL at 10/10/15 1400  . finasteride (PROSCAR) tablet 5 mg  5 mg Oral Daily Coralyn Helling, MD   5 mg at 10/10/15 1013  . fluticasone (FLONASE) 50 MCG/ACT nasal spray 2 spray  2 spray Each Nare Daily PRN Lorretta Harp, MD      . furosemide (LASIX) tablet 20 mg  20 mg Oral Daily Marykay Lex, MD   20 mg at 10/10/15 1620  . haloperidol lactate (HALDOL) injection 3 mg  3 mg Intravenous Q6H Calvert Cantor, MD   3 mg at 10/10/15 1607  . insulin aspart (novoLOG) injection 0-20 Units  0-20 Units Subcutaneous TID WC Coralyn Helling, MD   7 Units at 10/10/15 1224  . insulin aspart (novoLOG) injection 0-5 Units  0-5 Units Subcutaneous QHS Coralyn Helling, MD   4 Units at 10/07/15 2259  . insulin glargine (LANTUS) injection 20 Units  20 Units Subcutaneous BID Marinda Elk, MD      . ipratropium (ATROVENT) nebulizer solution 0.5 mg  0.5 mg Nebulization Q4H Marinda Elk, MD   0.5 mg at 10/10/15 1205  . levalbuterol (XOPENEX) nebulizer solution 0.63 mg  0.63 mg Nebulization 6 times per day Marinda Elk, MD   0.63 mg at 10/10/15 1205  . methylPREDNISolone sodium succinate (SOLU-MEDROL) 125 mg/2 mL injection 60 mg  60 mg Intravenous Q12H Marinda Elk, MD      .  metoprolol (LOPRESSOR) injection 5 mg  5 mg Intravenous Q4H PRN Marinda Elk, MD   5 mg at 10/05/15 0006  . morphine 2 MG/ML injection 2 mg  2 mg Intravenous Q2H PRN Marinda Elk, MD   2 mg at 10/10/15 1607  . nitroGLYCERIN (NITROSTAT) SL tablet 0.4 mg  0.4 mg Sublingual Q5 min PRN Lorretta Harp, MD      . ondansetron [facility]) injection 4 mg  4 mg Intravenous Q6H PRN Lorretta Harp, MD   4 mg at 10/05/15 1957  . rosuvastatin (CRESTOR) tablet 40 mg  40 mg Oral Daily Coralyn Helling, MD   40 mg at 10/09/15 1722  . sodium chloride 0.9 % injection 3 mL  3 mL Intravenous Q12H Lorretta Harp, MD   3 mL at 10/10/15 1012  . tamsulosin (FLOMAX) capsule 0.4 mg  0.4 mg Oral BID Coralyn Helling, MD   0.4 mg at 10/10/15 1014     Discharge Medications: Please see discharge summary for a list of discharge medications.  Relevant Imaging Results:  Relevant Lab Results:   Additional Information SSN: [id number]  Arlyss Repress, LCSW

## 2015-10-10 NOTE — Progress Notes (Signed)
PCCM PROGRESS NOTE  ADMISSION DATE: 09/30/2015 CONSULT DATE: 09/30/2015 REFERRING PROVIDER: Dr. Rito Ehrlich  CC: Short of breath  CULTURES: 1/14 Influenza >> negative 1/14 Pneumococcal Ag >> Positive 1/15 Sputum >> oral flora  ANTIBIOTICS: 1/14 Zithromax >> 1/16 1/16 Rocephin >>  STUDIES:  EVENTS: 1/14 Admit 1/16 Obtunded, stuck on BiPAP 1/17 Mental status improved 1/18 Back on BiPAP; cardiology consulted for a fib 1/20 - Didn't use BiPAP last night.  Still feels weak, but breathing improved.    SUBJECTIVE/OVERNIGHT/INTERVAL HX 1/24 doesn't feel that much better   VITAL SIGNS: BP 125/82 mmHg  Pulse 105  Temp(Src) 97.5 F (36.4 C) (Axillary)  Resp 22  Ht  (1.753 m)  Wt 171 lb 8.3 oz (77.8 kg)  BMI 25.32 kg/m2  SpO2 95%  INTAKE/OUTPUT: I/O last 3 completed shifts: In: 600 [P.O.:600] Out: 1400 [Urine:1400]  General: dramatically deconditioned very weak HEENT: no sinus tenderness Cardiac: irregular, no murmur Chest: prolonged exhalation, faint wheeze, audible loud upper airway noises, crackles in bases  .Abd: soft, non tender Ext: ankle edema Neuro: follows commands but CONFUSED Skin: no rashes  PULMONARY  Recent Labs Lab 10/09/15 1500  PHART 7.401  PCO2ART 57.4*  PO2ART 101*  HCO3 34.9*  TCO2 29.7  O2SAT 97.3    CBC  Recent Labs Lab 10/04/15 0357  HGB 15.1  HCT 49.0  WBC 14.2*  PLT 161    COAGULATION No results for input(s): INR in the last 168 hours.  CARDIAC  No results for input(s): TROPONINI in the last 168 hours. No results for input(s): PROBNP in the last 168 hours.   CHEMISTRY  Recent Labs Lab 10/04/15 0357 10/05/15 0400 10/06/15 0800 10/07/15 0338 10/08/15 0339 10/10/15 0426  NA 141 138 135 138 137  --   K 4.3 4.3 4.4 4.5 4.6  --   CL 98* 91* 90* 92* 91*  --   CO2 34* 38* 35* 35* 36*  --   GLUCOSE 237* 282* 211* 203* 194*  --   BUN 52* 54* 43* 45* 42*  --   CREATININE 1.23 1.13 0.98 1.06 0.96  --   CALCIUM  9.4 8.7* 8.7* 9.1 8.8*  --   MG  --   --   --   --   --  2.3  PHOS  --   --   --   --   --  2.5   Estimated Creatinine Clearance: 56.3 mL/min (by C-G formula based on Cr of 0.96).  ENDOCRINE CBG (last 3)   Recent Labs  10/09/15 1648 10/09/15 2227 10/10/15 0732  GLUCAP 202* 188* 179*     IMAGING x48h  - image(s) personally visualized  -   highlighted in bold No results found.   DISCUSSION: 82 yo male former smoker with cough, dyspnea, wheeze, hypoxia from AECOPD and Pneumococcal tracheobronchitis. Not improving much. Suspect that this is mostly d/t his deconditioning in the setting of severe underlying lung disease. Only interventions are supportive at this point. Cont BDs, slow taper steroids and pulm hygiene. He has not had CXR since 18th will recheck. If negative likely nothing else to do.   He has hx of advanced COPD on chronic prednisone, chronic systolic/diastolic CHF with EF 35 to 40% from Oct 2014, Chronic a fib on eliquis, CAD, HTN, HLD, s/p PM, GERD, DM. He is DNR/DNI.  ASSESSMENT/PLAN:  Acute hypoxic/hypercapnic respiratory failure. Plan: - oxygen to keep SpO2 88 to 95% - BiPAP prn >> ->REFUSES - prn morphine for dyspnea  AECOPD. Pneumococcal tracheobronchitis. - rocephin completed  Plan: - pulmicort, xopenex, ipratropium - continue solumedrol - f/u CXR today.    Chronic combined CHF. Hx of A fib, CAD, HTN, HLD. Plan: - per primary team and cardiology - repeat echo orderd 10/09/2015: - last 2014  Acute encephalopathy likely 2nd to hypercapnia and hospitalization and age and prior hx  - ongoing and hallucinating Plan: - monitor mental status - start scheduled low dose haldol 10/09/15 - EKG QTC 124!7   AKI >> improved. Plan: - monitor renal fx while getting lasix  Goals of Care >> DNR/DNI. Likely needs SNF rehab. He is similar to a few years ago when we discussed hospice etc., but he bounced back . He is very decodntioned. IF he declines will need  palliation   Simonne Martinet ACNP-BC [facility] Pulmonary/Critical Care Pager # [phone] OR # [phone] if no answer   10/10/2015 11:48 AM

## 2015-10-10 NOTE — Clinical Social Work Placement (Signed)
   CLINICAL SOCIAL WORK PLACEMENT  NOTE  Date:  10/10/2015  Patient Details  Name: [name] MRN: [id number] Date of Birth: 06-08-1928  Clinical Social Work is seeking post-discharge placement for this patient at the Skilled  Nursing Facility level of care (*CSW will initial, date and re-position this form in  chart as items are completed):  Yes   Patient/family provided with [place] Clinical Social Work Department's list of facilities offering this level of care within the geographic area requested by the patient (or if unable, by the patient's family).  Yes   Patient/family informed of their freedom to choose among providers that offer the needed level of care, that participate in Medicare, Medicaid or managed care program needed by the patient, have an available bed and are willing to accept the patient.  Yes   Patient/family informed of [facility]'s ownership interest in [facility] and [facility], as well as of the fact that they are under no obligation to receive care at these facilities.  PASRR submitted to EDS on 10/10/15     PASRR number received on 10/10/15     Existing PASRR number confirmed on       FL2 transmitted to all facilities in geographic area requested by pt/family on 10/10/15     FL2 transmitted to all facilities within larger geographic area on       Patient informed that his/her managed care company has contracts with or will negotiate with certain facilities, including the following:            Patient/family informed of bed offers received.  Patient chooses bed at       Physician recommends and patient chooses bed at      Patient to be transferred to   on  .  Patient to be transferred to facility by       Patient family notified on   of transfer.  Name of family member notified:        PHYSICIAN       Additional Comment:    _______________________________________________ Arlyss Repress, LCSW 10/10/2015, 4:31 PM

## 2015-10-10 NOTE — Evaluation (Signed)
Physical Therapy Evaluation Patient Details Name: [name] MRN: [id number] DOB: [birth year] Today's Date: 10/10/2015   History of Present Illness  82 yo male admitted with acute on chronic respiraotry failure, delirium. Hx of COPD, A fib, DM, CHF, AAA, HTN, OA, MI, pacemaker, PVD  Clinical Impression  On eval, pt required Min assist +2 for bed mobility. Pt unable to stand on today due to weakness, fatigue. Daughter present at start of session -reported pt was from [facility] and was able to ambulate with RW. Will recommend ST rehab at SNF prior to returning to [facility] apt.     Follow Up Recommendations SNF    Equipment Recommendations  None recommended by PT    Recommendations for Other Services       Precautions / Restrictions Precautions Precautions: Fall Restrictions Weight Bearing Restrictions: No      Mobility  Bed Mobility Overal bed mobility: Needs Assistance Bed Mobility: Supine to Sit     Supine to sit: Min assist;+2 for physical assistance;+2 for safety/equipment;HOB elevated     General bed mobility comments: Increased time. Multimodal cues for safety, technique. Utilized bedpad for scooting, positioning. Moderate reliance on bedrail  Transfers Overall transfer level: Needs assistance Equipment used: Rolling walker (2 wheeled) Transfers: Sit to/from Stand           General transfer comment: Attempted. Pt barely able to clear bottom from bed surface.   Ambulation/Gait             General Gait Details: NT-pt unable at this time-too weak  Stairs            Wheelchair Mobility    Modified Rankin (Stroke Patients Only)       Balance Overall balance assessment: Needs assistance Sitting-balance support: Feet supported;Bilateral upper extremity supported Sitting balance-Leahy Scale: Poor Sitting balance - Comments: Sat EOB at least 5 minutes total. LOB when attempting to scoot/reposition at EOB.                                      Pertinent Vitals/Pain Pain Assessment: Faces Faces Pain Scale: Hurts even more Pain Location: back Pain Descriptors / Indicators: Sore;Aching Pain Intervention(s): Monitored during session;Repositioned    Home Living Family/patient expects to be discharged to:: Private residence Living Arrangements: Alone   Type of Home: Independent living facility Home Access: Level entry     Home Layout: One level Home Equipment: Environmental consultant - 2 wheels;Walker - 4 wheels;Electric scooter      Prior Function Level of Independence: Independent with assistive device(s)         Comments: uses walker for ambulation. scooter when not feeling well (per daughter)     Hand Dominance        Extremity/Trunk Assessment   Upper Extremity Assessment: Generalized weakness           Lower Extremity Assessment: RLE deficits/detail;LLE deficits/detail RLE Deficits / Details: hip at least 2/5, knee ext 3-/5.  LLE Deficits / Details: hip at least 2-/5, knee ext 3-/5  Cervical / Trunk Assessment: Kyphotic  Communication   Communication: HOH  Cognition Arousal/Alertness: Lethargic Behavior During Therapy: WFL for tasks assessed/performed Overall Cognitive Status: Difficult to assess                      General Comments      Exercises General Exercises - Lower Extremity Long Arc Quad: AAROM;AROM;Both;5  reps;Seated (limited ROM due to weakness)      Assessment/Plan    PT Assessment Patient needs continued PT services  PT Diagnosis Difficulty walking;Generalized weakness;Altered mental status   PT Problem List Decreased strength;Decreased range of motion;Decreased activity tolerance;Decreased balance;Pain;Decreased cognition;Decreased mobility;Decreased knowledge of use of DME  PT Treatment Interventions DME instruction;Gait training;Functional mobility training;Therapeutic activities;Patient/family education;Balance training;Therapeutic exercise   PT Goals  (Current goals can be found in the Care Plan section) Acute Rehab PT Goals Patient Stated Goal: none stated PT Goal Formulation: Patient unable to participate in goal setting Time For Goal Achievement: 10/24/15 Potential to Achieve Goals: Good    Frequency Min 3X/week   Barriers to discharge        Co-evaluation               End of Session   Activity Tolerance: Patient limited by fatigue;Patient limited by lethargy Patient left: in bed;with call bell/phone within reach;with bed alarm set           Time: 4098-1191 PT Time Calculation (min) (ACUTE ONLY): 14 min   Charges:   PT Evaluation $PT Eval Moderate Complexity: 1 Procedure     PT G Codes:        Rebeca Alert, MPT Pager: [phone]

## 2015-10-10 NOTE — Progress Notes (Signed)
Nutrition Follow-up  DOCUMENTATION CODES:   Non-severe (moderate) malnutrition in context of chronic illness  INTERVENTION:  - Continue Ensure Enlive BID - Continue to encourage pt during meals  - RD will continue to monitor for needs  NUTRITION DIAGNOSIS:   Inadequate oral intake related to other (see comment) (SOB) as evidenced by meal completion < 25%. -ongoing mainly  GOAL:   Patient will meet greater than or equal to 90% of their needs -likely unmet  MONITOR:   PO intake, Supplement acceptance, Labs, Weight trends, Skin, I & O's  ASSESSMENT:   82 year old Caucasian male with a past medical history of advanced COPD not on home oxygen, hypertension, hyperlipidemia, diabetes, coronary artery disease, atrial fibrillation on anticoagulation, come mind, systolic and diastolic congestive heart failure, presented with cough and shortness of breath. He was found to have acute COPD exacerbation. He was placed on BiPAP and was admitted to the hospital.  1/24 Per chart review, pt ate 75% of breakfast 1/22; 25% of breakfast yesterday and breakfast and lunch today. Pt drowsy during RD visit. He indicates that he ate lunch but is unable to recall what he had to eat. Pt unable to provide any other information at this time. Notes indicate that pt has had acute confusion/delirium.  Per chart review, pt has lost 12 lbs (6.5% body weight) in the past 13 days which is significant for time frame. No diuretics ordered at this time; will continue to monitor weight trends. Ensure Enlive ordered during last assessment. Unsure if pt is consuming supplements at this time.  Likely not meeting needs. Medications reviewed. Labs reviewed; CBGs: 143-293 mg/dL, Cl: 91 mmol/L, BUN: 42 mg/dL, Ca: 8.8 mg/dL.   1/20 - Per cardiology note, pt has not had a BM thus far.  - Breathing improving, pt is off BiPaP. - Pt appears to work hard to breathe at this point; was on [place] during visit. - PO intake 15-50% during  past 4 meals, per chart.  - Per patient, he is completing at least half of the meals.  - Will order Ensure to help pt get more calories.  1/15 - Pt in room with BiPAP on.  - Pt with SOB today and reports eating very little this past week d/t SOB.  - Pt states he ate a few bites of peaches and a sip of tea of his lunch tray. - He has not eaten anything for breakfast.  - Pt declines supplements at this time.  - UBW is 180 lb. Weight has been stable.  - Nutrition-Focused physical exam completed. Findings are mild-moderate fat depletion, mild-moderate muscle depletion, and no edema.    Diet Order:  DIET DYS 3 Room service appropriate?: Yes; Fluid consistency:: Thin  Skin:  Reviewed, no issues  Last BM:  1/16  Height:   Ht Readings from Last 1 Encounters:  09/30/15  (1.753 m)    Weight:   Wt Readings from Last 1 Encounters:  10/10/15 171 lb 8.3 oz (77.8 kg)    Ideal Body Weight:  72.7 kg  BMI:  Body mass index is 25.32 kg/(m^2).  Estimated Nutritional Needs:   Kcal:  2300-2500  Protein:  115-125g  Fluid:  2.4L/day  EDUCATION NEEDS:   No education needs identified at this time     Trenton Gammon, RD, LDN Inpatient Clinical Dietitian Pager # [phone] After hours/weekend pager # [phone]

## 2015-10-10 NOTE — Progress Notes (Signed)
[facility] PROGRESS NOTE    Progress Note   [name] WJX:[id number] DOB: 03-Jun-1928 DOA: 09/30/2015 PCP: Martha Clan, MD   Brief Narrative:   [name] is an 82 y.o. male With past medical history of advanced COPD on home oxygen, diabetes mellitus, CAD atrial fibrillation on apixiban chronic systolic and diastolic heart failure comes to the ED complaining of cough and shortness of breath. She was found to be in acute COPD exacerbation placed on BiPAP and admitted to the hospital. She was started on IV Solu-Medrol steroids and PCCM was consulted. Patient  Cannot tolerate BiPAP when necessary.  Assessment/Plan:   Acute-on-chronic respiratory failure (HCC) Due to acute COPD exacerbation/acute metabolic encephalopathy: PCCM was consulted and recommended to continue BiPAP and IV morphine.  Continue IV steroids for an additional 24 hours a repeat a chest x-ray does not show any acute findings. I d/w the patient again he would like to cont aggressive treatment. The patient has improved very slowly I appreciate no cares assistance, he is on chronic prednisone at home. As per critical care he is similar as he was a few years ago when they discuss hospice but he seems to bounce back. He decompensates will need to discuss hospice care  Chronic, systolic and diastolic heart failure: Seems to be euvolemic, continue current regimen. The echo was done that shows an ejection fraction of 25%  CAD: Asymptomatic continue medical management.  Atrial Fibrillation with RVR CHA2DS2-VASc Score is 6. Patient is on Eliquis. Heart rate is hard to control continues to be in mild RVR Cardiology on board, appreciate assistance, heart rate continues to improve. Continues to be in RVR despite increasing diltiazem. Appreciate cardiology's assistance.  Controlled diabetes mellitus type 2: A1c was 6.7. Titrate Lantus, his blood glucose continues to be high likely due to steroids.  Acute  confusional state: Resolved continue Haldol when necessary.   BPH: Continue Flomax and Proscar  Malnutrition of moderate degree     DVT Prophylaxis - Lovenox ordered.  Family Communication: daughter Disposition Plan: Per Pulmonary Code Status:     Code Status Orders        Start     Ordered   09/30/15 0534  Do not attempt resuscitation (DNR)   Continuous    Question Answer Comment  In the event of cardiac or respiratory ARREST Do not call a "code blue"   In the event of cardiac or respiratory ARREST Do not perform Intubation, CPR, defibrillation or ACLS   In the event of cardiac or respiratory ARREST Use medication by any route, position, wound care, and other measures to relive pain and suffering. May use oxygen, suction and manual treatment of airway obstruction as needed for comfort.      09/30/15 0534    Code Status History    Date Active Date Inactive Code Status Order ID Comments User Context   03/27/2014 10:53 PM 03/29/2014  2:51 PM DNR [id number]  Alysia Penna, MD Inpatient   12/31/2013  5:02 PM 01/06/2014  6:19 PM DNR [id number]  Marrian Salvage, MD ED   09/09/2013  4:03 PM 09/17/2013  2:54 PM DNR [id number]  Hoyle Sauer, MD Inpatient   07/09/2013  7:51 PM 07/13/2013  1:34 PM DNR [id number]  Jarome Matin, MD Inpatient   07/09/2013  1:18 PM 07/09/2013  7:51 PM Full Code [id number]  Jarome Matin, MD ED   09/28/2012  7:29 AM 10/01/2012  6:12 PM DNR [id number]  Kari Baars, MD Inpatient  04/04/2012  1:02 PM 04/14/2012  2:29 PM DNR [id number]  Jarome Matin, MD ED   04/04/2012 12:57 PM 04/04/2012  1:02 PM DNR [id number]  Jarome Matin, MD ED   03/10/2012  5:58 PM 03/19/2012  4:40 PM DNR [id number]  Tammy Sours, RN Inpatient   11/13/2011 11:11 AM 11/15/2011  8:15 PM DNR [id number]  Lorinda Creed, NP Inpatient   11/08/2011  3:03 PM 11/13/2011 11:10 AM Partial Code [id number]  Kalman Shan, MD Inpatient   11/07/2011  4:13 PM 11/08/2011  3:03 PM Full Code [id number]  Theora Gianotti,  RN Inpatient    Advance Directive Documentation        Most Recent Value   Type of Advance Directive  Healthcare Power of Attorney, Living will   Pre-existing out of facility DNR order (yellow form or pink MOST form)     "MOST" Form in Place?          IV Access:    Peripheral IV   Procedures and diagnostic studies:   Dg Chest Port 1 View  2015/10/21  CLINICAL DATA:  Congestion, shortness of Breath EXAM: PORTABLE CHEST 1 VIEW COMPARISON:  10/04/2015 FINDINGS: There is hyperinflation of the lungs compatible with COPD. Left pacer remains in place, unchanged. Mild cardiomegaly. No focal airspace opacities or effusions. Stable right basilar pleural thickening. No acute bony abnormality. IMPRESSION: COPD, cardiomegaly.  No active disease. Electronically Signed   By: Charlett Nose M.D.   On: 10-21-2015 12:56     Medical Consultants:    None.  Anti-Infectives:   Anti-infectives    Start     Dose/Rate Route Frequency Ordered Stop   10/02/15 1000  cefTRIAXone (ROCEPHIN) 1 g in dextrose 5 % 50 mL IVPB  Status:  Discontinued     1 g 100 mL/hr over 30 Minutes Intravenous Every 24 hours 10/02/15 0912 10/09/15 1301   10/01/15 1000  azithromycin (ZITHROMAX) tablet 250 mg  Status:  Discontinued     250 mg Oral Daily 09/30/15 0530 10/02/15 0920   09/30/15 1000  azithromycin (ZITHROMAX) tablet 500 mg     500 mg Oral Daily 09/30/15 0530 09/30/15 0944      Subjective:    [name]  sleeping but he relates his dyspnea has not improved.  Objective:    Filed Vitals:   10/21/15 0812 2015/10/21 1013 10-21-2015 1014 10-21-15 1253  BP:  125/82  130/89  Pulse:   105 113  Temp:    98.2 F (36.8 C)  TempSrc:    Oral  Resp:    20  Height:      Weight:      SpO2: 95%   100%    Intake/Output Summary (Last 24 hours) at 2015/10/21 1311 Last data filed at 2015/10/21 1256  Gross per 24 hour  Intake    600 ml  Output   1750 ml  Net  -1150 ml   Filed Weights   10/08/15 1732 10/09/15  0412 2015-10-21 0502  Weight: 78.472 kg (173 lb) 77.248 kg (170 lb 4.8 oz) 77.8 kg (171 lb 8.3 oz)    Exam: Gen:  Awake alert and oriented 3 Cardiovascular:  RRR. Chest and lungs:   Moderate air movement and no wheezing and clear to auscultation. Abdomen:  Abdomen soft, NT/ND, + BS Extremities:  No edema.   Data Reviewed:    Labs: Basic Metabolic Panel:  Recent Labs Lab 10/04/15 0357 10/05/15 0400 10/06/15 0800 10/07/15 0338 10/08/15 0339 10-21-15 0426  NA 141 138  135 138 137  --   K 4.3 4.3 4.4 4.5 4.6  --   CL 98* 91* 90* 92* 91*  --   CO2 34* 38* 35* 35* 36*  --   GLUCOSE 237* 282* 211* 203* 194*  --   BUN 52* 54* 43* 45* 42*  --   CREATININE 1.23 1.13 0.98 1.06 0.96  --   CALCIUM 9.4 8.7* 8.7* 9.1 8.8*  --   MG  --   --   --   --   --  2.3  PHOS  --   --   --   --   --  2.5   GFR Estimated Creatinine Clearance: 56.3 mL/min (by C-G formula based on Cr of 0.96). Liver Function Tests: No results for input(s): AST, ALT, ALKPHOS, BILITOT, PROT, ALBUMIN in the last 168 hours. No results for input(s): LIPASE, AMYLASE in the last 168 hours. No results for input(s): AMMONIA in the last 168 hours. Coagulation profile No results for input(s): INR, PROTIME in the last 168 hours.  CBC:  Recent Labs Lab 10/04/15 0357  WBC 14.2*  HGB 15.1  HCT 49.0  MCV 98.4  PLT 161   Cardiac Enzymes: No results for input(s): CKTOTAL, CKMB, CKMBINDEX, TROPONINI in the last 168 hours. BNP (last 3 results) No results for input(s): PROBNP in the last 8760 hours. CBG:  Recent Labs Lab 10/09/15 0718 10/09/15 1648 10/09/15 2227 10/10/15 0732 10/10/15 1151  GLUCAP 291* 202* 188* 179* 250*   D-Dimer: No results for input(s): DDIMER in the last 72 hours. Hgb A1c: No results for input(s): HGBA1C in the last 72 hours. Lipid Profile: No results for input(s): CHOL, HDL, LDLCALC, TRIG, CHOLHDL, LDLDIRECT in the last 72 hours. Thyroid function studies: No results for input(s):  TSH, T4TOTAL, T3FREE, THYROIDAB in the last 72 hours.  Invalid input(s): FREET3 Anemia work up: No results for input(s): VITAMINB12, FOLATE, FERRITIN, TIBC, IRON, RETICCTPCT in the last 72 hours. Sepsis Labs:  Recent Labs Lab 10/04/15 0357  WBC 14.2*   Microbiology Recent Results (from the past 240 hour(s))  MRSA PCR Screening     Status: None   Collection Time: 09/30/15  9:40 PM  Result Value Ref Range Status   MRSA by PCR NEGATIVE NEGATIVE Final    Comment:        The GeneXpert MRSA Assay (FDA approved for NASAL specimens only), is one component of a comprehensive MRSA colonization surveillance program. It is not intended to diagnose MRSA infection nor to guide or monitor treatment for MRSA infections.   Culture, respiratory (NON-Expectorated)     Status: None   Collection Time: 10/01/15  1:30 PM  Result Value Ref Range Status   Specimen Description SPUTUM  Final   Special Requests NONE  Final   Gram Stain   Final    ABUNDANT WBC PRESENT,BOTH PMN AND MONONUCLEAR NO SQUAMOUS EPITHELIAL CELLS SEEN FEW GRAM POSITIVE COCCI IN CLUSTERS Performed at [facility]    Culture   Final    NORMAL OROPHARYNGEAL FLORA Performed at [facility]    Report Status 10/03/2015 FINAL  Final  Culture, expectorated sputum-assessment     Status: None   Collection Time: 10/01/15  1:32 PM  Result Value Ref Range Status   Specimen Description SPUTUM  Final   Special Requests NONE  Final   Sputum evaluation   Final    THIS SPECIMEN IS ACCEPTABLE. RESPIRATORY CULTURE REPORT TO FOLLOW.   Report Status 10/01/2015 FINAL  Final     Medications:   . antiseptic oral rinse  7 mL Mouth Rinse q12n4p  . apixaban  5 mg Oral BID  . bisoprolol  10 mg Oral Daily  . budesonide (PULMICORT) nebulizer solution  0.5 mg Nebulization BID  . chlorhexidine  15 mL Mouth Rinse BID  . dextromethorphan-guaiFENesin  1 tablet Oral BID  . digoxin  0.125 mg Oral Daily  . diltiazem  360 mg Oral  Daily  . feeding supplement (ENSURE ENLIVE)  237 mL Oral BID BM  . finasteride  5 mg Oral Daily  . haloperidol lactate  3 mg Intravenous Q6H  . insulin aspart  0-20 Units Subcutaneous TID WC  . insulin aspart  0-5 Units Subcutaneous QHS  . insulin glargine  10 Units Subcutaneous BID  . ipratropium  0.5 mg Nebulization Q4H  . levalbuterol  0.63 mg Nebulization 6 times per day  . [START ON 10/11/2015] predniSONE  60 mg Oral Q breakfast  . rosuvastatin  40 mg Oral Daily  . sodium chloride  3 mL Intravenous Q12H  . tamsulosin  0.4 mg Oral BID   Continuous Infusions:   Time spent: 25 min   LOS: 10 days   Marinda Elk  [facility] Pager [phone]  *Please refer to [email or web address], password [email or web address] to get updated schedule on who will round on this patient, as hospitalists switch teams weekly. If 7PM-7AM, please contact night-coverage at [email or web address], password [email or web address] for any overnight needs.  10/10/2015, 1:11 PM

## 2015-10-11 DIAGNOSIS — E440 Moderate protein-calorie malnutrition: Secondary | ICD-10-CM

## 2015-10-11 DIAGNOSIS — F329 Major depressive disorder, single episode, unspecified: Secondary | ICD-10-CM

## 2015-10-11 DIAGNOSIS — N400 Enlarged prostate without lower urinary tract symptoms: Secondary | ICD-10-CM

## 2015-10-11 LAB — GLUCOSE, CAPILLARY
GLUCOSE-CAPILLARY: 134 mg/dL — AB (ref 65–99)
Glucose-Capillary: 170 mg/dL — ABNORMAL HIGH (ref 65–99)
Glucose-Capillary: 70 mg/dL (ref 65–99)
Glucose-Capillary: 123 mg/dL — ABNORMAL HIGH (ref 65–99)

## 2015-10-11 LAB — MAGNESIUM: Magnesium: 2.2 mg/dL (ref 1.7–2.4)

## 2015-10-11 LAB — PHOSPHORUS: PHOSPHORUS: 2.7 mg/dL (ref 2.5–4.6)

## 2015-10-11 MED ORDER — HALOPERIDOL LACTATE 5 MG/ML IJ SOLN
2.0000 mg | Freq: Four times a day (QID) | INTRAMUSCULAR | Status: DC | PRN
  Administered 2015-10-11 (×2): 2 mg via INTRAVENOUS
  Filled 2015-10-11: qty 1

## 2015-10-11 MED ORDER — MORPHINE SULFATE (PF) 2 MG/ML IV SOLN
1.0000 mg | INTRAVENOUS | Status: DC | PRN
  Administered 2015-10-11 – 2015-10-16 (×23): 1 mg via INTRAVENOUS
  Filled 2015-10-11 (×24): qty 1

## 2015-10-11 MED ORDER — LEVALBUTEROL HCL 0.63 MG/3ML IN NEBU
0.6300 mg | INHALATION_SOLUTION | Freq: Four times a day (QID) | RESPIRATORY_TRACT | Status: DC
  Administered 2015-10-11 – 2015-10-12 (×4): 0.63 mg via RESPIRATORY_TRACT
  Filled 2015-10-11 (×5): qty 3

## 2015-10-11 MED ORDER — BISACODYL 10 MG RE SUPP
10.0000 mg | Freq: Every day | RECTAL | Status: AC
  Administered 2015-10-11 – 2015-10-12 (×2): 10 mg via RECTAL
  Filled 2015-10-11 (×2): qty 1

## 2015-10-11 MED ORDER — IPRATROPIUM BROMIDE 0.02 % IN SOLN
0.5000 mg | Freq: Four times a day (QID) | RESPIRATORY_TRACT | Status: DC
  Administered 2015-10-11 – 2015-10-12 (×4): 0.5 mg via RESPIRATORY_TRACT
  Filled 2015-10-11 (×5): qty 2.5

## 2015-10-11 MED ORDER — SENNOSIDES-DOCUSATE SODIUM 8.6-50 MG PO TABS
1.0000 | ORAL_TABLET | Freq: Two times a day (BID) | ORAL | Status: DC
  Administered 2015-10-11 – 2015-10-15 (×9): 1 via ORAL
  Filled 2015-10-11 (×9): qty 1

## 2015-10-11 NOTE — Consult Note (Signed)
   [facility] CM Inpatient Consult   10/11/2015  [name] 07/01/1928 [id number]  Patient screened for [facility] Care Management services. Spoke with inpatient RNCM. Patient likely to go to SNF at discharge. Will not engage for [facility] Care Management services at this time. Noted patient has had 1 admit in the past 6 months.   [name], MSN-Ed, RN,BSN [facility] Liaison [phone]

## 2015-10-11 NOTE — Evaluation (Signed)
Clinical/Bedside Swallow Evaluation Patient Details  Name: [name] MRN: [id number] Date of Birth: 06/24/1928  Today's Date: 10/11/2015 Time: SLP Start Time (ACUTE ONLY): 1150 SLP Stop Time (ACUTE ONLY): 1210 SLP Time Calculation (min) (ACUTE ONLY): 20 min  Past Medical History:  Past Medical History  Diagnosis Date  . Pneumonia 12/06/10    healthcare-associated/ left lower lobe  . COPD (chronic obstructive pulmonary disease) (HCC)     severe stage IV  . Atrial fibrillation (HCC)     on Coumadin with St. Jude single-chamber pacemaker  . Diabetes mellitus     type 2; s/p Prednisone therapy for pneumonia 12/2010  . Coronary artery disease     s/p anterior STEMI 09/2009; cath. revealed mid LAD 40%, distal LAD 100%- PTCA, prox. RCA 40%, mid. RCA 100% with good collateral filling of PDA; EF 25%; NSTEMI 07/2011 - PCI/DES 100% LCX  . ST elevation (STEMI) myocardial infarction (HCC) 01//11/12    anterior  . CHF (congestive heart failure) (HCC)     EF 25% on 09/26/10; EF 50-55% and grade 1 diastolic dysfunction on ECHO 08/2010   . Hypertension   . Hyperlipidemia   . AAA (abdominal aortic aneurysm) (HCC)     3 cm infrarenal abdominal aortic aneurysm per aorta ultrasound 03/2010  . Osteoarthritis     s/p bilat hip arthroplasty  . Angina   . Ischemic cardiomyopathy     EF 35% LHC 11/12  . GERD (gastroesophageal reflux disease)   . Hypercholesteremia   . PVD (peripheral vascular disease) (HCC)   . Anxiety    Past Surgical History:  Past Surgical History  Procedure Laterality Date  . Hip arthroplasty      s/p right 09/27/10 and s/p left 09/24/10  . Back surgery    . Knee surgery    . Pacemaker insertion  12/2010    St. Jude single-chamber   . Cardiac catheterization  09/2009, 06/2007    PTCA to distal LAD  . Percutaneous coronary stent intervention (pci-s) N/A 08/13/2011    Procedure: PERCUTANEOUS CORONARY STENT INTERVENTION (PCI-S);  Surgeon: Tonny Bollman, MD;  Location:  [facility] CATH LAB;  Service: Cardiovascular;  Laterality: N/A;  . Left heart catheterization with coronary angiogram N/A 08/13/2011    Procedure: LEFT HEART CATHETERIZATION WITH CORONARY ANGIOGRAM;  Surgeon: Tonny Bollman, MD;  Location: [facility] CATH LAB;  Service: Cardiovascular;  Laterality: N/A;   HPI:  82 yo male adm to [facility] with respiratory difficulties. PMH + for COPD, Afib with AVR, DM2, GERD, malnutrition, CAD, prior smoker.  CXR 10/10/15 negative for active disease, + COPD.  Swallow evaluation ordered,   Per RN, ? plan for palliative referral.     Assessment / Plan / Recommendation Clinical Impression  Pt presents with indication of intact oropharyngeal swallow ability, no s/s of aspiration with po observed * water, green beans, spaghetti.   His cranial nerve exam in unremarkable but he is mildly dyspenic.  Pt admits to occasional coughing with food more than drink, admits that he may not masticate well at times and if he modifies behavior symptoms are largely decreased.  Slow but effective mastication noted with few bites pt accepted of lunch.  Upon completion of "meal", pt presented with frequent belching = of which he stated was productive to reflux.  Suspect reflux may be due to reflux primarly per pt symptoms.     Recommend strict precautions, SLP to sign off.  Educated pt to findings/recommendations including strict reflux precautions, eating small amounts and  resting if short of breath or sense poor clearance.     Aspiration Risk  Moderate aspiration risk    Diet Recommendation Dysphagia 3 (Mech soft);Thin liquid   Liquid Administration via: Cup;Straw Medication Administration: Whole meds with liquid Supervision: Full supervision/cueing for compensatory strategies Postural Changes: Seated upright at 90 degrees;Remain upright for at least 30 minutes after po intake    Other  Recommendations Oral Care Recommendations: Oral care BID   Follow up Recommendations  None    Frequency and  Duration     none       Prognosis  Guarded      Swallow Study   General Date of Onset: 10/11/15 HPI: 82 yo male adm to [facility] with respiratory difficulties. PMH + for COPD, Afib with AVR, DM2, GERD, malnutrition, CAD, prior smoker.  CXR 10/10/15 negative for active disease, + COPD.  Swallow evaluation ordered,   Per RN, ? plan for palliative referral.   Type of Study: Bedside Swallow Evaluation Diet Prior to this Study: Dysphagia 3 (soft);Thin liquids Temperature Spikes Noted: No Respiratory Status: Nasal cannula History of Recent Intubation: No Behavior/Cognition: Alert;Cooperative (sleepy) Oral Cavity Assessment: Dry Oral Care Completed by SLP: No Oral Cavity - Dentition: Adequate natural dentition Vision: Functional for self-feeding Self-Feeding Abilities: Needs assist;Total assist Patient Positioning: Upright in bed Baseline Vocal Quality: Low vocal intensity;Hoarse Volitional Cough: Weak Volitional Swallow: Unable to elicit    Oral/Motor/Sensory Function Overall Oral Motor/Sensory Function: Generalized oral weakness (generalized weakness, no focal deficits)   Ice Chips Ice chips: Not tested   Thin Liquid Thin Liquid: Within functional limits Presentation: Self Fed;Straw    Nectar Thick Nectar Thick Liquid: Not tested   Honey Thick Honey Thick Liquid: Not tested   Puree Puree: Not tested   Solid   GO   Solid: Within functional limits Presentation: Self Fed;Spoon Other Comments: spaghetti and green beans - few boluses        Mills Koller, MS [facility] SLP [phone]

## 2015-10-11 NOTE — Progress Notes (Addendum)
PCCM PROGRESS NOTE  ADMISSION DATE: 09/30/2015 CONSULT DATE: 09/30/2015 REFERRING PROVIDER: Dr. Rito Ehrlich  CC: Short of breath  CULTURES: 1/14 Influenza >> negative 1/14 Pneumococcal Ag >> Positive 1/15 Sputum >> oral flora  ANTIBIOTICS: 1/14 Zithromax >> 1/16 1/16 Rocephin >> 1/23  STUDIES: Echo 1/23 >> LVEF 20-25%, severe diffuse hypokinesis. PAP 36 CXR 1/24 >> COPD, no acute changes  EVENTS: 1/14 Admit 1/16 Obtunded, stuck on BiPAP 1/17 Mental status improved 1/18 Back on BiPAP; cardiology consulted for a fib 1/20 - Didn't use BiPAP last night.  Still feels weak, but breathing improved.  SUBJECTIVE/OVERNIGHT/INTERVAL HX Feels better, states that breathing has improved.  VITAL SIGNS: BP 101/57 mmHg  Pulse 93  Temp(Src) 97.6 F (36.4 C) (Axillary)  Resp 18  Ht  (1.753 m)  Wt 173 lb 3.2 oz (78.563 kg)  BMI 25.57 kg/m2  SpO2 98%  INTAKE/OUTPUT: I/O last 3 completed shifts: In: 660 [P.O.:660] Out: 1525 [Urine:1525]  General: Deconditioned very weak, awake, alert HEENT: no sinus tenderness Cardiac: irregular, no MRG Chest: Faint exp wheeze, no crackles. .Abd: Soft, non tender Ext: 1+ edema Neuro: Follows commands. No focal deficits Skin: No rashes  PULMONARY  Recent Labs Lab 10/09/15 1500  PHART 7.401  PCO2ART 57.4*  PO2ART 101*  HCO3 34.9*  TCO2 29.7  O2SAT 97.3    CBC No results for input(s): HGB, HCT, WBC, PLT in the last 168 hours.  COAGULATION No results for input(s): INR in the last 168 hours.  CARDIAC  No results for input(s): TROPONINI in the last 168 hours. No results for input(s): PROBNP in the last 168 hours.   CHEMISTRY  Recent Labs Lab 10/05/15 0400 10/06/15 0800 10/07/15 0338 10/08/15 0339 10/10/15 0426 10/11/15 0345  NA 138 135 138 137  --   --   K 4.3 4.4 4.5 4.6  --   --   CL 91* 90* 92* 91*  --   --   CO2 38* 35* 35* 36*  --   --   GLUCOSE 282* 211* 203* 194*  --   --   BUN 54* 43* 45* 42*  --   --    CREATININE 1.13 0.98 1.06 0.96  --   --   CALCIUM 8.7* 8.7* 9.1 8.8*  --   --   MG  --   --   --   --  2.3 2.2  PHOS  --   --   --   --  2.5 2.7   Estimated Creatinine Clearance: 56.3 mL/min (by C-G formula based on Cr of 0.96).  ENDOCRINE CBG (last 3)   Recent Labs  10/10/15 1647 10/10/15 2114 10/11/15 0739  GLUCAP 206* 296* 134*     IMAGING x48h  - image(s) personally visualized  -   highlighted in bold Dg Chest Port 1 View  10/10/2015  CLINICAL DATA:  Congestion, shortness of Breath EXAM: PORTABLE CHEST 1 VIEW COMPARISON:  10/04/2015 FINDINGS: There is hyperinflation of the lungs compatible with COPD. Left pacer remains in place, unchanged. Mild cardiomegaly. No focal airspace opacities or effusions. Stable right basilar pleural thickening. No acute bony abnormality. IMPRESSION: COPD, cardiomegaly.  No active disease. Electronically Signed   By: Charlett Nose M.D.   On: 10/10/2015 12:56     DISCUSSION: 82 yo male former smoker with cough, dyspnea, wheeze, hypoxia from AECOPD and tracheobronchitis. Not improving much. Suspect that this is mostly d/t his deconditioning in the setting of severe underlying lung disease. Only interventions are supportive at  this point. Cont BDs, slow taper steroids and pulm hygiene.   He has hx of advanced COPD on chronic prednisone, chronic systolic/diastolic CHF with EF 20-25% from Jan 17, Chronic a fib on eliquis, CAD, HTN, HLD, s/p PM, GERD, DM. He is DNR/DNI.  ASSESSMENT/PLAN:  Acute hypoxic/hypercapnic respiratory failure. Plan: - oxygen to keep SpO2 88 to 95% - BiPAP prn >> ->Refusing/ - prn morphine for dyspnea  AECOPD. Tracheobronchitis. Plan: - pulmicort, xopenex, ipratropium - continue solumedrol, flutter valve and incentive spirometer - rocephin completed   Chronic combined CHF. EF 20-25% Hx of A fib, CAD, HTN, HLD. Plan: - per primary team and cardiology  Acute encephalopathy likely 2nd to hypercapnia and hospitalization  and age and prior hx  - ongoing and hallucinating Plan: - monitor mental status - On scheduled low dose haldol. Follow EKG.  AKI >> improved. Plan: - monitor renal fx while getting lasix  Goals of Care >> DNR/DNI. Likely needs SNF rehab. He is similar to a few years ago when we discussed hospice etc., but he bounced back . He is very deconditioned and may need a palliative care consult.   Chilton Greathouse MD [facility] Pager [phone] If no answer or after 3pm call: [phone] 10/11/2015, 10:00 AM

## 2015-10-11 NOTE — Progress Notes (Signed)
Patient Name: [name] Date of Encounter: 10/11/2015  Hospital Problem List     Principal Problem:   Acute-on-chronic respiratory failure [facility]) Active Problems:   CAD S/P previous PCI   Emphysema/COPD (HCC)   Pacemaker-[employer]   Type 2 diabetes mellitus (HCC)   Permanent atrial fibrillation (HCC)   Depression   Chronic anticoagulation   Chronic combined systolic and diastolic congestive heart failure (HCC)   COPD with acute exacerbation (HCC)   Chronic respiratory failure with hypoxia (HCC)   GERD (gastroesophageal reflux disease)   BPH (benign prostatic hyperplasia)   Malnutrition of moderate degree   Atrial fibrillation with RVR (HCC)    Subjective   General malaise this AM.  Breathing slightly better.  AF remains relatively rapid, 100-120 but asymptomatic.  No c/p.  Inpatient Medications    . antiseptic oral rinse  7 mL Mouth Rinse q12n4p  . apixaban  5 mg Oral BID  . bisacodyl  10 mg Rectal Daily  . bisoprolol  20 mg Oral Daily  . budesonide (PULMICORT) nebulizer solution  0.5 mg Nebulization BID  . chlorhexidine  15 mL Mouth Rinse BID  . dextromethorphan-guaiFENesin  1 tablet Oral BID  . digoxin  0.125 mg Oral Daily  . diltiazem  300 mg Oral Daily  . feeding supplement (ENSURE ENLIVE)  237 mL Oral BID BM  . finasteride  5 mg Oral Daily  . furosemide  20 mg Oral Daily  . insulin aspart  0-20 Units Subcutaneous TID WC  . insulin aspart  0-5 Units Subcutaneous QHS  . insulin glargine  20 Units Subcutaneous BID  . ipratropium  0.5 mg Nebulization Q4H  . levalbuterol  0.63 mg Nebulization 6 times per day  . methylPREDNISolone (SOLU-MEDROL) injection  60 mg Intravenous Q12H  . rosuvastatin  40 mg Oral Daily  . senna-docusate  1 tablet Oral BID  . sodium chloride  3 mL Intravenous Q12H  . tamsulosin  0.4 mg Oral BID    Vital Signs    Filed Vitals:   10/10/15 2119 10/11/15 0019 10/11/15 0544 10/11/15 0839  BP: 131/93  101/57   Pulse: 115  93   Temp:  97.5 F (36.4 C)  97.6 F (36.4 C)   TempSrc: Oral  Axillary   Resp: 20  18   Height:      Weight:   173 lb 3.2 oz (78.563 kg)   SpO2: 100% 98% 97% 98%    Intake/Output Summary (Last 24 hours) at 10/11/15 1150 Last data filed at 10/11/15 0552  Gross per 24 hour  Intake    120 ml  Output    825 ml  Net   -705 ml   Filed Weights   10/09/15 0412 10/10/15 0502 10/11/15 0544  Weight: 170 lb 4.8 oz (77.248 kg) 171 lb 8.3 oz (77.8 kg) 173 lb 3.2 oz (78.563 kg)    Physical Exam    General: Pleasant, NAD. Neuro: Alert and oriented X 3. Moves all extremities spontaneously. Psych: Flat affect. HEENT:  Normal  Neck: Supple without bruits or JVD. Lungs:  Resp regular and unlabored, diminished breath sounds bilat with coarse breath sounds/rhonchi throughout and occas insp/exp wheezing. Heart: IR, IR, tachy, distant, no murmurs. Abdomen: Soft, non-tender, non-distended, BS + x 4.  Extremities: No clubbing, cyanosis or LE edema. The left forearm is swollen and erythematous.  DP/PT/Radials 2+ and equal bilaterally.  Labs   Basic Metabolic Panel  Recent Labs  10/10/15 0426 10/11/15 0345  MG 2.3  2.2  PHOS 2.5 2.7    Telemetry    Afib, PVC's, 100-120's.  Radiology    Dg Chest Port 1 View  10/10/2015  CLINICAL DATA:  Congestion, shortness of Breath EXAM: PORTABLE CHEST 1 VIEW COMPARISON:  10/04/2015 FINDINGS: There is hyperinflation of the lungs compatible with COPD. Left pacer remains in place, unchanged. Mild cardiomegaly. No focal airspace opacities or effusions. Stable right basilar pleural thickening. No acute bony abnormality. IMPRESSION: COPD, cardiomegaly.  No active disease. Electronically Signed   By: Charlett Nose M.D.   On: 10/10/2015 12:56   Dg Chest Port 1 View  10/04/2015  CLINICAL DATA:  Respiratory failure. EXAM: PORTABLE CHEST 1 VIEW COMPARISON:  10/02/2015 .  09/30/2015.  03/29/2015.  03/27/2014 . FINDINGS: Mediastinum and hilar structures normal. Cardiac pacer in  stable position. Cardiomegaly. Stable right base pleural parenchymal thickening. No new infiltrate. Cardiac pacer with lead tip projected over the right ventricle . Cardiomegaly with normal pulmonary vascularity. No pleural effusion or pneumothorax . IMPRESSION: 1. Cardiac pacer with lead tip projected over the right ventricle. Stable cardiomegaly. Scratched 2. Right base pleural-parenchymal thickening consistent with scarring. Electronically Signed   By: Maisie Fus  Register   On: 10/04/2015 07:46   Dg Chest Port 1 View  10/02/2015  CLINICAL DATA:  82 year old male with shortness of breath EXAM: PORTABLE CHEST 1 VIEW COMPARISON:  Radiograph dated 09/30/2015 FINDINGS: Single-view of the chest demonstrates emphysematous changes of the lungs. Mild interstitial prominence may represent a degree of congestion. No focal consolidation. There is blunting of the right costophrenic angle likely a small pleural effusion. No pneumothorax. Stable cardiomegaly. The osseous structures appear unremarkable. IMPRESSION: Cardiomegaly with possible mild congestion.  No focal consolidation. Emphysema.  No pneumothorax. Small right pleural effusion. Electronically Signed   By: Elgie Collard M.D.   On: 10/02/2015 05:36   Dg Chest Port 1 View  09/30/2015  CLINICAL DATA:  Acute onset of respiratory distress. Initial encounter. EXAM: PORTABLE CHEST 1 VIEW COMPARISON:  Chest radiograph performed 03/29/2015 FINDINGS: The lungs are hyperexpanded, with flattening of the hemidiaphragms, compatible with COPD. There is chronic blunting of the right costophrenic angle. Chronically increased interstitial markings are seen. No definite acute focal airspace consolidation, pleural effusion or pneumothorax is identified. The cardiomediastinal silhouette is mildly enlarged. A pacemaker is noted overlying the left chest wall, with a single lead ending overlying the right ventricle. No acute osseous abnormalities are seen. IMPRESSION: Findings of  COPD; mild cardiomegaly.  Lungs otherwise grossly clear. Electronically Signed   By: Roanna Raider M.D.   On: 09/30/2015 04:04  _____________   2D Echocardiogram 1.23.2017  Study Conclusions  - Left ventricle: The cavity size was moderately dilated. Systolic function was severely reduced. The estimated ejection fraction was in the range of 20% to 25%. Severe, diffuse hypokinesis. The study is not technically sufficient to allow evaluation of LV diastolic function. - Ventricular septum: Septal motion showed abnormal function and dyssynergy. - Mitral valve: Transvalvular velocity was within the normal range. There was no evidence for stenosis. There was moderate regurgitation. - Left atrium: The atrium was severely dilated. - Right ventricle: The cavity size was normal. Wall thickness was normal. Systolic function was normal. - Right atrium: The atrium was severely dilated. - Atrial septum: No defect or patent foramen ovale was identified by color flow Doppler. - Tricuspid valve: There was mild regurgitation. - Pulmonary arteries: Systolic pressure was mildly increased. PA peak pressure: 36 mm Hg (S). - Inferior vena cava: The vessel was  dilated. The respirophasic diameter changes were blunted (< 50%), consistent with elevated central venous pressure. _____________  Assessment & Plan    1.  Acute Respiratory Failure/AECOPD:  Completed abx.  Steroids/inhalers per IM/CCM.  2.  Afib RVR:  H/o chronic AF with elevated rates in the setting of acute illness.  Rates remain difficult to control, trending in the low 100's to 120's.  In setting of LV dysfxn, dilt reduced and bb titrated yesterday.  CHA2DS2VASc =  6 Eliquis.  Rate clearly driven by underlying illness.  He's already on max dose bisoprolol.  Though dilt not ideal in setting of LV dysfxn, he has been on it chronically.  Only other option for getting him off of dilt, would to be to put him on amio for rate  control.  As he is still getting haldol frequently, we'd be running the risk of QT prolongation.  Difficult situation.  3.  CAD:  No c/p.  Trop neg.  Cont bb/statin.  No asa in setting of eliquis.  4.  DM II:  On lantus/SSI.  Per IM.  5.  Chronic systolic CHF:  EF 20-25% by echo this admission in setting of AF RVR.  Minus 2.4 L.  Wt recorded as up 3 lbs in last 3 days - ? Accuracy.  Does not appear to be significantly volume overloaded.  Cont bb/digoxin.  No acei/arb/arni/spiro at this point as bp has been variable and is currently in low 100's.  Cont po lasix.  6.  ? LUE Cellulitis:  L forearm swollen and erythematous.  ? New.  Just completed course of rocephin.  Per IM.  Signed, Nicolasa Ducking NP  I have seen and evaluated the patient this afternoon along with Mr. Brion Aliment, NP-C.  We reviewed the patient's chart, telemetry all available data. I agree with his findings, examination and recommendations as noted above.  Unfortunately we are very limited as to options. He was more sleepy today than yesterday, but says that his breathing is better.  He does seem somewhat less labored.   His blood pressure remained stable after increasing beta blocker and somewhat reducing the calcium channel blocker. We may need to go back up on the diltiazem for additional rate control. I do think that a good portion of his tachycardia is mediated by his respiratory symptoms.   I started low-dose Lasix yesterday. We will need to recheck a chemistry panel tomorrow. If renal function remains stable and potassium is stable, we may increase diuretic.   I essentially agree with the remainder the plan as indicated above and discuss with Mr. Almeta Monas, Piedad Climes, M.D., M.S. Interventional Cardiologist   Pager # [phone] Phone # [phone] [address] [place], [place] [address]

## 2015-10-11 NOTE — Progress Notes (Addendum)
[facility] PROGRESS NOTE    Progress Note   IKECHUKWU CERNY ZOX:[id number] DOB: 10-10-1927 DOA: 09/30/2015 PCP: Martha Clan, MD   Brief Narrative:   [name] is an 82 y.o. male With past medical history of advanced COPD on home oxygen, diabetes mellitus, CAD atrial fibrillation on apixiban chronic systolic and diastolic heart failure comes to the ED complaining of cough and shortness of breath. She was found to be in acute COPD exacerbation placed on BiPAP and admitted to the hospital. She was started on IV Solu-Medrol steroids and PCCM was consulted. Patient  Cannot tolerate BiPAP when necessary.  Assessment/Plan:   Acute-on-chronic respiratory failure (HCC) Due to acute COPD exacerbation/acute metabolic encephalopathy: PCCM was consulted and recommended to continue BiPAP and IV morphine.   he is on chronic prednisone at home, currently on  IV steroids for an additional 24 hours a repeat a chest x-ray does not show any acute findings. As per critical care he is similar as he was a few years ago when they discuss hospice but he seems to bounce back. Dr Robb Matar d/w the patient again he would like to cont aggressive treatment. If not making progress, will need to discuss hospice care again with patient. Currently patient does not want to be bothered.  Chronic, systolic and diastolic heart failure: Seems to be euvolemic, continue current regimen. The echo was done that shows an ejection fraction of 25%  CAD: Asymptomatic continue medical management.  Atrial Fibrillation with RVR CHA2DS2-VASc Score is 6. Patient is on Eliquis. Heart rate is hard to control continues to be in mild RVR Cardiology on board, appreciate assistance, heart rate continues to improve. Continues to be in RVR despite increasing diltiazem. Appreciate cardiology's assistance.  Controlled diabetes mellitus type 2: A1c was 6.7. Titrate Lantus, his blood glucose continues to be high likely due to  steroids.  Acute confusional state: Resolved continue Haldol when necessary.   BPH: Continue Flomax and Proscar  Constipation: started stool regimen  Malnutrition of moderate degree  Addendum: patient seems very drowsy, daughter concerned that patient might be too sedated from iv morphine, will decrease iv morphine to 1mg  prn, change haldol to prn dose     DVT Prophylaxis - Lovenox ordered.  Family Communication: patient and daughter in room Disposition Plan: Per Pulmonary Code Status:     Code Status Orders        Start     Ordered   09/30/15 0534  Do not attempt resuscitation (DNR)   Continuous    Question Answer Comment  In the event of cardiac or respiratory ARREST Do not call a "code blue"   In the event of cardiac or respiratory ARREST Do not perform Intubation, CPR, defibrillation or ACLS   In the event of cardiac or respiratory ARREST Use medication by any route, position, wound care, and other measures to relive pain and suffering. May use oxygen, suction and manual treatment of airway obstruction as needed for comfort.      09/30/15 0534    Code Status History    Date Active Date Inactive Code Status Order ID Comments User Context   03/27/2014 10:53 PM 03/29/2014  2:51 PM DNR [id number]  Alysia Penna, MD Inpatient   12/31/2013  5:02 PM 01/06/2014  6:19 PM DNR [id number]  Marrian Salvage, MD ED   09/09/2013  4:03 PM 09/17/2013  2:54 PM DNR [id number]  Hoyle Sauer, MD Inpatient   07/09/2013  7:51 PM 07/13/2013  1:34 PM DNR  [id number]  Jarome Matin, MD Inpatient   07/09/2013  1:18 PM 07/09/2013  7:51 PM Full Code [id number]  Jarome Matin, MD ED   09/28/2012  7:29 AM 10/01/2012  6:12 PM DNR [id number]  Kari Baars, MD Inpatient   04/04/2012  1:02 PM 04/14/2012  2:29 PM DNR [id number]  Jarome Matin, MD ED   04/04/2012 12:57 PM 04/04/2012  1:02 PM DNR [id number]  Jarome Matin, MD ED   03/10/2012  5:58 PM 03/19/2012  4:40 PM DNR [id number]  Tammy Sours, RN Inpatient    11/13/2011 11:11 AM 11/15/2011  8:15 PM DNR [id number]  Lorinda Creed, NP Inpatient   11/08/2011  3:03 PM 11/13/2011 11:10 AM Partial Code [id number]  Kalman Shan, MD Inpatient   11/07/2011  4:13 PM 11/08/2011  3:03 PM Full Code [id number]  Theora Gianotti, RN Inpatient    Advance Directive Documentation        Most Recent Value   Type of Advance Directive  Healthcare Power of Attorney, Living will   Pre-existing out of facility DNR order (yellow form or pink MOST form)     "MOST" Form in Place?          IV Access:    Peripheral IV   Procedures and diagnostic studies:   Dg Chest Port 1 View  10-22-15  CLINICAL DATA:  Congestion, shortness of Breath EXAM: PORTABLE CHEST 1 VIEW COMPARISON:  10/04/2015 FINDINGS: There is hyperinflation of the lungs compatible with COPD. Left pacer remains in place, unchanged. Mild cardiomegaly. No focal airspace opacities or effusions. Stable right basilar pleural thickening. No acute bony abnormality. IMPRESSION: COPD, cardiomegaly.  No active disease. Electronically Signed   By: Charlett Nose M.D.   On: 10-22-15 12:56     Medical Consultants:    None.  Anti-Infectives:   Anti-infectives    Start     Dose/Rate Route Frequency Ordered Stop   10/02/15 1000  cefTRIAXone (ROCEPHIN) 1 g in dextrose 5 % 50 mL IVPB  Status:  Discontinued     1 g 100 mL/hr over 30 Minutes Intravenous Every 24 hours 10/02/15 0912 10/09/15 1301   10/01/15 1000  azithromycin (ZITHROMAX) tablet 250 mg  Status:  Discontinued     250 mg Oral Daily 09/30/15 0530 10/02/15 0920   09/30/15 1000  azithromycin (ZITHROMAX) tablet 500 mg     500 mg Oral Daily 09/30/15 0530 09/30/15 0944      Subjective:    Mable B Caligiuri  Very frail,  gurgling, denies chest pain, reported no cough, tolerating diet, reported no bm for several days.  Objective:    Filed Vitals:   10-22-15 2119 10/11/15 0019 10/11/15 0544 10/11/15 0839  BP: 131/93  101/57   Pulse: 115  93   Temp: 97.5 F  (36.4 C)  97.6 F (36.4 C)   TempSrc: Oral  Axillary   Resp: 20  18   Height:      Weight:   78.563 kg (173 lb 3.2 oz)   SpO2: 100% 98% 97% 98%    Intake/Output Summary (Last 24 hours) at 10/11/15 1013 Last data filed at 10/11/15 0552  Gross per 24 hour  Intake    120 ml  Output    825 ml  Net   -705 ml   Filed Weights   10/09/15 0412 10/22/15 0502 10/11/15 0544  Weight: 77.248 kg (170 lb 4.8 oz) 77.8 kg (171 lb 8.3 oz) 78.563 kg (173 lb 3.2 oz)    Exam: Gen:  Very frail, audible gurgling, lethargic but oriented3 Cardiovascular:  RRR. Chest and lungs:   Moderate air movement and no wheezing and clear to auscultation. Abdomen:  Abdomen soft, NT/ND, + BS Extremities:  No edema. Skin: diffuse erythema upper extremity, worse on the left.   Data Reviewed:    Labs: Basic Metabolic Panel:  Recent Labs Lab 10/05/15 0400 10/06/15 0800 10/07/15 0338 10/08/15 0339 10/10/15 0426 10/11/15 0345  NA 138 135 138 137  --   --   K 4.3 4.4 4.5 4.6  --   --   CL 91* 90* 92* 91*  --   --   CO2 38* 35* 35* 36*  --   --   GLUCOSE 282* 211* 203* 194*  --   --   BUN 54* 43* 45* 42*  --   --   CREATININE 1.13 0.98 1.06 0.96  --   --   CALCIUM 8.7* 8.7* 9.1 8.8*  --   --   MG  --   --   --   --  2.3 2.2  PHOS  --   --   --   --  2.5 2.7   GFR Estimated Creatinine Clearance: 56.3 mL/min (by C-G formula based on Cr of 0.96). Liver Function Tests: No results for input(s): AST, ALT, ALKPHOS, BILITOT, PROT, ALBUMIN in the last 168 hours. No results for input(s): LIPASE, AMYLASE in the last 168 hours. No results for input(s): AMMONIA in the last 168 hours. Coagulation profile No results for input(s): INR, PROTIME in the last 168 hours.  CBC: No results for input(s): WBC, NEUTROABS, HGB, HCT, MCV, PLT in the last 168 hours. Cardiac Enzymes: No results for input(s): CKTOTAL, CKMB, CKMBINDEX, TROPONINI in the last 168 hours. BNP (last 3 results) No results for input(s): PROBNP in  the last 8760 hours. CBG:  Recent Labs Lab 10/10/15 0732 10/10/15 1151 10/10/15 1647 10/10/15 2114 10/11/15 0739  GLUCAP 179* 250* 206* 296* 134*   D-Dimer: No results for input(s): DDIMER in the last 72 hours. Hgb A1c: No results for input(s): HGBA1C in the last 72 hours. Lipid Profile: No results for input(s): CHOL, HDL, LDLCALC, TRIG, CHOLHDL, LDLDIRECT in the last 72 hours. Thyroid function studies: No results for input(s): TSH, T4TOTAL, T3FREE, THYROIDAB in the last 72 hours.  Invalid input(s): FREET3 Anemia work up: No results for input(s): VITAMINB12, FOLATE, FERRITIN, TIBC, IRON, RETICCTPCT in the last 72 hours. Sepsis Labs: No results for input(s): PROCALCITON, WBC, LATICACIDVEN in the last 168 hours. Microbiology Recent Results (from the past 240 hour(s))  Culture, respiratory (NON-Expectorated)     Status: None   Collection Time: 10/01/15  1:30 PM  Result Value Ref Range Status   Specimen Description SPUTUM  Final   Special Requests NONE  Final   Gram Stain   Final    ABUNDANT WBC PRESENT,BOTH PMN AND MONONUCLEAR NO SQUAMOUS EPITHELIAL CELLS SEEN FEW GRAM POSITIVE COCCI IN CLUSTERS Performed at [facility]    Culture   Final    NORMAL OROPHARYNGEAL FLORA Performed at [facility]    Report Status 10/03/2015 FINAL  Final  Culture, expectorated sputum-assessment     Status: None   Collection Time: 10/01/15  1:32 PM  Result Value Ref Range Status   Specimen Description SPUTUM  Final   Special Requests NONE  Final   Sputum evaluation   Final    THIS SPECIMEN IS ACCEPTABLE. RESPIRATORY CULTURE REPORT TO FOLLOW.   Report Status 10/01/2015 FINAL  Final     Medications:   . antiseptic oral rinse  7 mL Mouth Rinse q12n4p  . apixaban  5 mg Oral BID  . bisacodyl  10 mg Rectal Daily  . bisoprolol  20 mg Oral Daily  . budesonide (PULMICORT) nebulizer solution  0.5 mg Nebulization BID  . chlorhexidine  15 mL Mouth Rinse BID  .  dextromethorphan-guaiFENesin  1 tablet Oral BID  . digoxin  0.125 mg Oral Daily  . diltiazem  300 mg Oral Daily  . feeding supplement (ENSURE ENLIVE)  237 mL Oral BID BM  . finasteride  5 mg Oral Daily  . furosemide  20 mg Oral Daily  . haloperidol lactate  3 mg Intravenous Q6H  . insulin aspart  0-20 Units Subcutaneous TID WC  . insulin aspart  0-5 Units Subcutaneous QHS  . insulin glargine  20 Units Subcutaneous BID  . ipratropium  0.5 mg Nebulization Q4H  . levalbuterol  0.63 mg Nebulization 6 times per day  . methylPREDNISolone (SOLU-MEDROL) injection  60 mg Intravenous Q12H  . rosuvastatin  40 mg Oral Daily  . senna-docusate  1 tablet Oral BID  . sodium chloride  3 mL Intravenous Q12H  . tamsulosin  0.4 mg Oral BID   Continuous Infusions:   Time spent: 25 min   LOS: 11 days   Eliab Closson MD PhD  [facility] Pager [phone]  *Please refer to [email or web address], password [email or web address] to get updated schedule on who will round on this patient, as hospitalists switch teams weekly. If 7PM-7AM, please contact night-coverage at [email or web address], password [email or web address] for any overnight needs.  10/11/2015, 10:13 AM

## 2015-10-12 LAB — CBC
HEMATOCRIT: 48.2 % (ref 39.0–52.0)
HEMOGLOBIN: 15.2 g/dL (ref 13.0–17.0)
MCH: 30 pg (ref 26.0–34.0)
MCHC: 31.5 g/dL (ref 30.0–36.0)
MCV: 95.3 fL (ref 78.0–100.0)
Platelets: 154 10*3/uL (ref 150–400)
RBC: 5.06 MIL/uL (ref 4.22–5.81)
RDW: 13.3 % (ref 11.5–15.5)
WBC: 19.3 10*3/uL — ABNORMAL HIGH (ref 4.0–10.5)

## 2015-10-12 LAB — GLUCOSE, CAPILLARY
GLUCOSE-CAPILLARY: 105 mg/dL — AB (ref 65–99)
Glucose-Capillary: 100 mg/dL — ABNORMAL HIGH (ref 65–99)
Glucose-Capillary: 143 mg/dL — ABNORMAL HIGH (ref 65–99)
Glucose-Capillary: 85 mg/dL (ref 65–99)

## 2015-10-12 LAB — BASIC METABOLIC PANEL
Anion gap: 9 (ref 5–15)
BUN: 35 mg/dL — ABNORMAL HIGH (ref 6–20)
CHLORIDE: 94 mmol/L — AB (ref 101–111)
CO2: 35 mmol/L — AB (ref 22–32)
CREATININE: 0.79 mg/dL (ref 0.61–1.24)
Calcium: 8.5 mg/dL — ABNORMAL LOW (ref 8.9–10.3)
GFR calc non Af Amer: >60 mL/min (ref >60)
Glucose, Bld: 99 mg/dL (ref 65–99)
Potassium: 4 mmol/L (ref 3.5–5.1)
Sodium: 138 mmol/L (ref 135–145)

## 2015-10-12 LAB — TSH: TSH: 0.25 u[IU]/mL — ABNORMAL LOW (ref 0.350–4.500)

## 2015-10-12 LAB — PHOSPHORUS: PHOSPHORUS: 3 mg/dL (ref 2.5–4.6)

## 2015-10-12 LAB — MAGNESIUM: Magnesium: 2.2 mg/dL (ref 1.7–2.4)

## 2015-10-12 MED ORDER — AMIODARONE HCL 150 MG/3ML IV SOLN
150.0000 mg | Freq: Once | INTRAVENOUS | Status: DC
  Filled 2015-10-12: qty 3

## 2015-10-12 MED ORDER — AMIODARONE IV BOLUS ONLY 150 MG/100ML
150.0000 mg | Freq: Once | INTRAVENOUS | Status: AC
  Administered 2015-10-12: 150 mg via INTRAVENOUS
  Filled 2015-10-12: qty 100

## 2015-10-12 MED ORDER — LEVALBUTEROL HCL 0.63 MG/3ML IN NEBU
0.6300 mg | INHALATION_SOLUTION | Freq: Three times a day (TID) | RESPIRATORY_TRACT | Status: DC
  Administered 2015-10-13: 0.63 mg via RESPIRATORY_TRACT
  Filled 2015-10-12 (×2): qty 3

## 2015-10-12 MED ORDER — DILTIAZEM HCL ER COATED BEADS 180 MG PO CP24
360.0000 mg | ORAL_CAPSULE | Freq: Every day | ORAL | Status: DC
  Administered 2015-10-12 – 2015-10-16 (×5): 360 mg via ORAL
  Filled 2015-10-12 (×5): qty 2

## 2015-10-12 MED ORDER — POLYETHYLENE GLYCOL 3350 17 G PO PACK
17.0000 g | PACK | Freq: Every day | ORAL | Status: DC
  Administered 2015-10-13 – 2015-10-17 (×3): 17 g via ORAL
  Filled 2015-10-12 (×5): qty 1

## 2015-10-12 MED ORDER — IPRATROPIUM BROMIDE 0.02 % IN SOLN
0.5000 mg | Freq: Three times a day (TID) | RESPIRATORY_TRACT | Status: DC
  Administered 2015-10-13: 0.5 mg via RESPIRATORY_TRACT
  Filled 2015-10-12 (×2): qty 2.5

## 2015-10-12 NOTE — Progress Notes (Signed)
[facility] PROGRESS NOTE    Progress Note   [name] AOZ:[id number] DOB: [birth year] DOA: 09/30/2015 PCP: Martha Clan, MD   Brief Narrative:   [name] is an 82 y.o. male With past medical history of advanced COPD on home oxygen, diabetes mellitus, CAD atrial fibrillation on apixiban chronic systolic and diastolic heart failure comes to the ED complaining of cough and shortness of breath. She was found to be in acute COPD exacerbation placed on BiPAP and admitted to the hospital. She was started on IV Solu-Medrol steroids and PCCM was consulted. Patient  Cannot tolerate BiPAP when necessary.  Assessment/Plan:   Acute-on-chronic respiratory failure (HCC) Due to acute COPD exacerbation/acute metabolic encephalopathy: PCCM was consulted and recommended to continue BiPAP and IV morphine.   he is on chronic prednisone at home, currently on  IV steroids for an additional 24 hours a repeat a chest x-ray does not [name] any acute findings. As per critical care he is similar as he was a few years ago when they discuss hospice but he seems to bounce back. Dr Robb Matar d/w the patient again he would like to cont aggressive treatment.  not making progress,  pulm recommended hospice care. Palliative care consulted  Chronic, systolic and diastolic heart failure: Seems to be euvolemic, continue current regimen. The echo was done that shows an ejection fraction of 25%  CAD: Asymptomatic continue medical management.  Atrial Fibrillation with RVR CHA2DS2-VASc Score is 6. Patient is on Eliquis. Heart rate is hard to control continues to be in mild RVR Cardiology on board, appreciate assistance, heart rate continues to improve. Continues to be in RVR despite increasing diltiazem. Appreciate cardiology's assistance.  Controlled diabetes mellitus type 2: A1c was 6.7. Titrate Lantus, his blood glucose continues to be high likely due to steroids.  Acute confusional state: Resolved  continue Haldol when necessary.   BPH: Continue Flomax and Proscar  Constipation: started stool regimen  Malnutrition of moderate degree  Agitation: has required prn haldol and morphine, daughter concerned that patient might be too sedated from iv morphine,  iv morphine decreased to  prn, change haldol to prn dose     DVT Prophylaxis - Lovenox ordered.  Family Communication: patient  Disposition Plan: Per Pulmonary Code Status:     Code Status Orders        Start     Ordered   09/30/15 0534  Do not attempt resuscitation (DNR)   Continuous    Question Answer Comment  In the event of cardiac or respiratory ARREST Do not call a "code blue"   In the event of cardiac or respiratory ARREST Do not perform Intubation, CPR, defibrillation or ACLS   In the event of cardiac or respiratory ARREST Use medication by any route, position, wound care, and other measures to relive pain and suffering. May use oxygen, suction and manual treatment of airway obstruction as needed for comfort.      09/30/15 0534    Code Status History    Date Active Date Inactive Code Status Order ID Comments User Context   03/27/2014 10:53 PM 03/29/2014  2:51 PM DNR [id number]  Alysia Penna, MD Inpatient   12/31/2013  5:02 PM 01/06/2014  6:19 PM DNR [id number]  Marrian Salvage, MD ED   09/09/2013  4:03 PM 09/17/2013  2:54 PM DNR [id number]  Hoyle Sauer, MD Inpatient   07/09/2013  7:51 PM 07/13/2013  1:34 PM DNR [id number]  Jarome Matin, MD Inpatient   07/09/2013  1:18 PM 07/09/2013  7:51 PM Full Code [id number]  Jarome Matin, MD ED   09/28/2012  7:29 AM 10/01/2012  6:12 PM DNR [id number]  Kari Baars, MD Inpatient   04/04/2012  1:02 PM 04/14/2012  2:29 PM DNR [id number]  Jarome Matin, MD ED   04/04/2012 12:57 PM 04/04/2012  1:02 PM DNR [id number]  Jarome Matin, MD ED   03/10/2012  5:58 PM 03/19/2012  4:40 PM DNR [id number]  Tammy Sours, RN Inpatient   11/13/2011 11:11 AM 11/15/2011  8:15 PM DNR [id number]  Lorinda Creed, NP Inpatient   11/08/2011  3:03 PM 11/13/2011 11:10 AM Partial Code [id number]  Kalman Shan, MD Inpatient   11/07/2011  4:13 PM 11/08/2011  3:03 PM Full Code [id number]  Theora Gianotti, RN Inpatient    Advance Directive Documentation        Most Recent Value   Type of Advance Directive  Healthcare Power of Attorney, Living will   Pre-existing out of facility DNR order (yellow form or pink MOST form)     "MOST" Form in Place?          IV Access:    Peripheral IV   Procedures and diagnostic studies:   No results found.   Medical Consultants:    None.  Anti-Infectives:   Anti-infectives    Start     Dose/Rate Route Frequency Ordered Stop   10/02/15 1000  cefTRIAXone (ROCEPHIN) 1 g in dextrose 5 % 50 mL IVPB  Status:  Discontinued     1 g 100 mL/hr over 30 Minutes Intravenous Every 24 hours 10/02/15 0912 10/09/15 1301   10/01/15 1000  azithromycin (ZITHROMAX) tablet 250 mg  Status:  Discontinued     250 mg Oral Daily 09/30/15 0530 10/02/15 0920   09/30/15 1000  azithromycin (ZITHROMAX) tablet 500 mg     500 mg Oral Daily 09/30/15 0530 09/30/15 0944      Subjective:    [name]  Very frail,  gurgling, denies chest pain, reported no cough, tolerating diet, reported no bm for several days. Patient does not want to be bothered, does not want to talk, but did allow me to listen to his lungs briefly.  Objective:    Filed Vitals:   10/12/15 0610 10/12/15 1120 10/12/15 1440 10/12/15 1516  BP: 115/75   126/66  Pulse: 46 125 90 77  Temp: 97.6 F (36.4 C)   97.6 F (36.4 C)  TempSrc: Axillary   Axillary  Resp: Height:      Weight: 79.334 kg (174 lb 14.4 oz)     SpO2: 91%   93%    Intake/Output Summary (Last 24 hours) at 10/12/15 1813 Last data filed at 10/12/15 4259  Gross per 24 hour  Intake    180 ml  Output    900 ml  Net   -720 ml   Filed Weights   10/10/15 0502 10/11/15 0544 10/12/15 0610  Weight: 77.8 kg (171 lb 8.3 oz)  78.563 kg (173 lb 3.2 oz) 79.334 kg (174 lb 14.4 oz)    Exam: Gen:  Very frail, audible gurgling, lethargic with intermittent agitation Cardiovascular:  RRR. Chest and lungs:   Overall diminished, no wheezing today Abdomen:  Abdomen soft, NT/ND, + BS Extremities:  No edema. Skin: diffuse erythema upper extremity, worse on the left.   Data Reviewed:    Labs: Basic Metabolic Panel:  Recent Labs Lab 10/06/15 0800 10/07/15 5638 10/08/15 7564 10/10/15 3329  10/11/15 0345 10/12/15 0355  NA 135 138 137  --   --  138  K 4.4 4.5 4.6  --   --  4.0  CL 90* 92* 91*  --   --  94*  CO2 35* 35* 36*  --   --  35*  GLUCOSE 211* 203* 194*  --   --  99  BUN 43* 45* 42*  --   --  35*  CREATININE 0.98 1.06 0.96  --   --  0.79  CALCIUM 8.7* 9.1 8.8*  --   --  8.5*  MG  --   --   --  2.3 2.2 2.2  PHOS  --   --   --  2.5 2.7 3.0   GFR Estimated Creatinine Clearance: 67.5 mL/min (by C-G formula based on Cr of 0.79). Liver Function Tests: No results for input(s): AST, ALT, ALKPHOS, BILITOT, PROT, ALBUMIN in the last 168 hours. No results for input(s): LIPASE, AMYLASE in the last 168 hours. No results for input(s): AMMONIA in the last 168 hours. Coagulation profile No results for input(s): INR, PROTIME in the last 168 hours.  CBC:  Recent Labs Lab 10/12/15 0355  WBC 19.3*  HGB 15.2  HCT 48.2  MCV 95.3  PLT 154   Cardiac Enzymes: No results for input(s): CKTOTAL, CKMB, CKMBINDEX, TROPONINI in the last 168 hours. BNP (last 3 results) No results for input(s): PROBNP in the last 8760 hours. CBG:  Recent Labs Lab 10/11/15 1656 10/11/15 2128 10/12/15 0732 10/12/15 1205 10/12/15 1658  GLUCAP 70 123* 105* 100* 143*   D-Dimer: No results for input(s): DDIMER in the last 72 hours. Hgb A1c: No results for input(s): HGBA1C in the last 72 hours. Lipid Profile: No results for input(s): CHOL, HDL, LDLCALC, TRIG, CHOLHDL, LDLDIRECT in the last 72 hours. Thyroid function  studies:  Recent Labs  10/12/15 0355  TSH 0.250*   Anemia work up: No results for input(s): VITAMINB12, FOLATE, FERRITIN, TIBC, IRON, RETICCTPCT in the last 72 hours. Sepsis Labs:  Recent Labs Lab 10/12/15 0355  WBC 19.3*   Microbiology No results found for this or any previous visit (from the past 240 hour(s)).   Medications:   . antiseptic oral rinse  7 mL Mouth Rinse q12n4p  . apixaban  5 mg Oral BID  . bisoprolol  20 mg Oral Daily  . budesonide (PULMICORT) nebulizer solution  0.5 mg Nebulization BID  . chlorhexidine  15 mL Mouth Rinse BID  . dextromethorphan-guaiFENesin  1 tablet Oral BID  . digoxin  0.125 mg Oral Daily  . diltiazem  360 mg Oral Daily  . feeding supplement (ENSURE ENLIVE)  237 mL Oral BID BM  . finasteride  5 mg Oral Daily  . furosemide  20 mg Oral Daily  . insulin aspart  0-20 Units Subcutaneous TID WC  . insulin aspart  0-5 Units Subcutaneous QHS  . insulin glargine  20 Units Subcutaneous BID  . ipratropium  0.5 mg Nebulization Q6H WA  . levalbuterol  0.63 mg Nebulization Q6H WA  . methylPREDNISolone (SOLU-MEDROL) injection  60 mg Intravenous Q12H  . rosuvastatin  40 mg Oral Daily  . senna-docusate  1 tablet Oral BID  . sodium chloride  3 mL Intravenous Q12H  . tamsulosin  0.4 mg Oral BID   Continuous Infusions:   Time spent: 25 min   LOS: 12 days   Raeven Pint MD PhD  [facility] Pager [phone]  *Please refer to [email or web address], password [email or web address] to  get updated schedule on who will round on this patient, as hospitalists switch teams weekly. If 7PM-7AM, please contact night-coverage at [email or web address], password [email or web address] for any overnight needs.  10/12/2015, 6:13 PM

## 2015-10-12 NOTE — Progress Notes (Signed)
Physical Therapy Treatment Patient Details Name: [name] MRN: [id number] DOB: November 02, 1927 Today's Date: Nov 07, 2015    History of Present Illness 82 yo male admitted with acute on chronic respiraotry failure, delirium. Hx of COPD, A fib, DM, CHF, AAA, HTN, OA, MI, pacemaker, PVD    PT Comments    Pt aroused for session however fatigued very quickly and requested return to supine.  Follow Up Recommendations  SNF     Equipment Recommendations  None recommended by PT    Recommendations for Other Services       Precautions / Restrictions Precautions Precautions: Fall    Mobility  Bed Mobility Overal bed mobility: Needs Assistance Bed Mobility: Supine to Sit;Sit to Supine     Supine to sit: +2 for physical assistance;HOB elevated;Mod assist Sit to supine: Max assist;+2 for physical assistance   General bed mobility comments: pt utilized rail for UE assisting however still required assist for trunk upright,  Multimodal cues for safety and technique. Utilized bedpad for scooting, positioning. More assist required for back to bed due to fatigue  Transfers Overall transfer level: Needs assistance               General transfer comment: Attempted however pt still too weak to stand; pt sliding off bed, legs not strong enough to hold pt so did not continue for safety  Ambulation/Gait             General Gait Details: NT-pt unable at this time-too weak   Stairs            Wheelchair Mobility    Modified Rankin (Stroke Patients Only)       Balance Overall balance assessment: Needs assistance Sitting-balance support: Feet supported;Bilateral upper extremity supported Sitting balance-Leahy Scale: Poor Sitting balance - Comments: at first pt able to self support trunk however after attempting standing, pt requiring trunk support due to fatigue and weakness                            Cognition Arousal/Alertness: Lethargic Behavior During  Therapy: WFL for tasks assessed/performed Overall Cognitive Status: Within Functional Limits for tasks assessed (appropriate during session, sleepy but able to remain awake for movement)                      Exercises      General Comments        Pertinent Vitals/Pain Pain Assessment: No/denies pain    Home Living                      Prior Function            PT Goals (current goals can now be found in the care plan section) Progress towards PT goals: Progressing toward goals    Frequency  Min 3X/week    PT Plan      Co-evaluation             End of Session Equipment Utilized During Treatment: Oxygen (remained on 3L O2 [place]) Activity Tolerance: Patient limited by fatigue Patient left: in bed;with call bell/phone within reach;with bed alarm set;with family/visitor present     Time: 4098-1191 PT Time Calculation (min) (ACUTE ONLY): 11 min  Charges:  $Therapeutic Activity: 8-22 mins                    G Codes:      Reice Bienvenue,KATHrine E 2015/11/07,  12:58 PM Zenovia Jarred, PT, DPT 10/12/2015 Pager: [phone]

## 2015-10-12 NOTE — Progress Notes (Signed)
Patient Name: [name] Date of Encounter: 10/12/2015  Hospital Problem List     Principal Problem:   Acute-on-chronic respiratory failure [facility]) Active Problems:   CAD S/P previous PCI   Emphysema/COPD (HCC)   Pacemaker-[employer]   Type 2 diabetes mellitus (HCC)   Permanent atrial fibrillation (HCC)   Depression   Chronic anticoagulation   Chronic combined systolic and diastolic congestive heart failure (HCC)   COPD with acute exacerbation (HCC)   Chronic respiratory failure with hypoxia (HCC)   GERD (gastroesophageal reflux disease)   BPH (benign prostatic hyperplasia)   Malnutrition of moderate degree   Atrial fibrillation with RVR (HCC)    Subjective   Breathing stable.  No chest pain or palpitations.  HR's remain elevated - 100-120.  Inpatient Medications    . antiseptic oral rinse  7 mL Mouth Rinse q12n4p  . apixaban  5 mg Oral BID  . bisacodyl  10 mg Rectal Daily  . bisoprolol  20 mg Oral Daily  . budesonide (PULMICORT) nebulizer solution  0.5 mg Nebulization BID  . chlorhexidine  15 mL Mouth Rinse BID  . dextromethorphan-guaiFENesin  1 tablet Oral BID  . digoxin  0.125 mg Oral Daily  . diltiazem  300 mg Oral Daily  . feeding supplement (ENSURE ENLIVE)  237 mL Oral BID BM  . finasteride  5 mg Oral Daily  . furosemide  20 mg Oral Daily  . insulin aspart  0-20 Units Subcutaneous TID WC  . insulin aspart  0-5 Units Subcutaneous QHS  . insulin glargine  20 Units Subcutaneous BID  . ipratropium  0.5 mg Nebulization Q6H WA  . levalbuterol  0.63 mg Nebulization Q6H WA  . methylPREDNISolone (SOLU-MEDROL) injection  60 mg Intravenous Q12H  . rosuvastatin  40 mg Oral Daily  . senna-docusate  1 tablet Oral BID  . sodium chloride  3 mL Intravenous Q12H  . tamsulosin  0.4 mg Oral BID    Vital Signs    Filed Vitals:   10/11/15 1340 10/11/15 2040 10/11/15 2131 10/12/15 0610  BP: 100/76  110/86 115/75  Pulse: 106  112 46  Temp: 97.5 F (36.4 C)  97.5 F (36.4  C) 97.6 F (36.4 C)  TempSrc: Axillary  Oral Axillary  Resp: Height:      Weight:    174 lb 14.4 oz (79.334 kg)  SpO2: 100% 99% 100% 91%    Intake/Output Summary (Last 24 hours) at 10/12/15 0830 Last data filed at 10/12/15 4098  Gross per 24 hour  Intake    180 ml  Output   1250 ml  Net  -1070 ml   Filed Weights   10/10/15 0502 10/11/15 0544 10/12/15 0610  Weight: 171 lb 8.3 oz (77.8 kg) 173 lb 3.2 oz (78.563 kg) 174 lb 14.4 oz (79.334 kg)    Physical Exam    General: Pleasant, NAD. Neuro: Alert and oriented X 3. Moves all extremities spontaneously. Psych: flat affect. HEENT:  Normal  Neck: Supple without bruits or JVD. Lungs:  Resp regular and unlabored, diminished and coarse breath sounds bilat throughout. Heart: IR, IR, tachy, distant, no s3, s4, or murmurs. Abdomen: Soft, non-tender, non-distended, BS + x 4.  Extremities: No clubbing, cyanosis or edema. DP/PT/Radials 2+ and equal bilaterally.  Labs    CBC  Recent Labs  10/12/15 0355  WBC 19.3*  HGB 15.2  HCT 48.2  MCV 95.3  PLT 154   Basic Metabolic Panel  Recent Labs  10/11/15 0345 10/12/15 0355  NA  --  138  K  --  4.0  CL  --  94*  CO2  --  35*  GLUCOSE  --  99  BUN  --  35*  CREATININE  --  0.79  CALCIUM  --  8.5*  MG 2.2 2.2  PHOS 2.7 3.0   Thyroid Function Tests  Recent Labs  10/12/15 0355  TSH 0.250*    Telemetry    AFib, PVC's, 6 beats NSVT.  Radiology    Dg Chest Port 1 View  10/10/2015  CLINICAL DATA:  Congestion, shortness of Breath EXAM: PORTABLE CHEST 1 VIEW COMPARISON:  10/04/2015 FINDINGS: There is hyperinflation of the lungs compatible with COPD. Left pacer remains in place, unchanged. Mild cardiomegaly. No focal airspace opacities or effusions. Stable right basilar pleural thickening. No acute bony abnormality. IMPRESSION: COPD, cardiomegaly.  No active disease. Electronically Signed   By: Charlett Nose M.D.   On: 10/10/2015 12:56   Dg Chest Port 1  View  10/04/2015  CLINICAL DATA:  Respiratory failure. EXAM: PORTABLE CHEST 1 VIEW COMPARISON:  10/02/2015 .  09/30/2015.  03/29/2015.  03/27/2014 . FINDINGS: Mediastinum and hilar structures normal. Cardiac pacer in stable position. Cardiomegaly. Stable right base pleural parenchymal thickening. No new infiltrate. Cardiac pacer with lead tip projected over the right ventricle . Cardiomegaly with normal pulmonary vascularity. No pleural effusion or pneumothorax . IMPRESSION: 1. Cardiac pacer with lead tip projected over the right ventricle. Stable cardiomegaly. Scratched 2. Right base pleural-parenchymal thickening consistent with scarring. Electronically Signed   By: Maisie Fus  Register   On: 10/04/2015 07:46   Dg Chest Port 1 View  10/02/2015  CLINICAL DATA:  82 year old male with shortness of breath EXAM: PORTABLE CHEST 1 VIEW COMPARISON:  Radiograph dated 09/30/2015 FINDINGS: Single-view of the chest demonstrates emphysematous changes of the lungs. Mild interstitial prominence may represent a degree of congestion. No focal consolidation. There is blunting of the right costophrenic angle likely a small pleural effusion. No pneumothorax. Stable cardiomegaly. The osseous structures appear unremarkable. IMPRESSION: Cardiomegaly with possible mild congestion.  No focal consolidation. Emphysema.  No pneumothorax. Small right pleural effusion. Electronically Signed   By: Elgie Collard M.D.   On: 10/02/2015 05:36   Dg Chest Port 1 View  09/30/2015  CLINICAL DATA:  Acute onset of respiratory distress. Initial encounter. EXAM: PORTABLE CHEST 1 VIEW COMPARISON:  Chest radiograph performed 03/29/2015 FINDINGS: The lungs are hyperexpanded, with flattening of the hemidiaphragms, compatible with COPD. There is chronic blunting of the right costophrenic angle. Chronically increased interstitial markings are seen. No definite acute focal airspace consolidation, pleural effusion or pneumothorax is identified. The  cardiomediastinal silhouette is mildly enlarged. A pacemaker is noted overlying the left chest wall, with a single lead ending overlying the right ventricle. No acute osseous abnormalities are seen. IMPRESSION: Findings of COPD; mild cardiomegaly.  Lungs otherwise grossly clear. Electronically Signed   By: Roanna Raider M.D.   On: 09/30/2015 04:04    Assessment & Plan    1. Acute Respiratory Failure/AECOPD: Completed abx. Steroids/inhalers per IM/CCM.  2. Afib RVR: H/o chronic AF with elevated rates in the setting of acute illness. Rates remain difficult to control, trending in the low 100's to 120's. In setting of LV dysfxn, dilt reduced and bb titrated 1/24. CHA2DS2VASc = 6 Eliquis. Rate clearly driven by underlying illness though TSH suppressed - ? Role of mild hyperthyroidism. He's already on max dose bisoprolol. Could consider switching to metoprolol however  I suspect he may wheeze at the doses required to improve his HR.  Though dilt not ideal in setting of LV dysfxn, he has been on it chronically @ 240 daily. Only other option for getting him off of dilt, would to be to put him on amio for rate control. As he is still getting haldol frequently, we'd be running the risk of QT prolongation. Difficult situation.  3. CAD: No c/p. Trop neg. Cont bb/statin. No asa in setting of eliquis.  4. DM II: On lantus/SSI. Per IM.  5. Chronic systolic CHF: EF 16-10% by echo this admission in setting of AF RVR. Minus 3.1 L for admission and minus 1L overnight. Wt recorded as up 4 lbs in last 3 days and up 1 lb overnight despite neg balance - ? Accuracy. Does not appear to be significantly volume overloaded. Cont bb/digoxin. No acei/arb/arni/spiro at this point as bp has been variable and is currently in low 100's. Cont po lasix.  6. ? LUE Cellulitis: L forearm swollen, erythematous, weeping. Just completed course of rocephin. Afebrile. WBC up in setting of steroids. Per  IM.  7.  ? Hyperthyroidism:  TSH mildly suppressed @ 0.250.  ? Role in ongoing tachycardia.  Further eval per IM.  Signed, Nicolasa Ducking NP   I seen and evaluated the patient along with Mr. Brion Aliment, NP. We reviewed the chart and discuss potential options for treatment. I agree with his findings, examination and recommendations. At this point he continues to be tachycardic. I will increase his diltiazem back to 360 mg as his blood pressure seems to be tolerating the beta blocker dose. Also try one time bolus of amiodarone to see if this potentially could slow him down. If it is more effective than the other medications, would consider using amiodarone now for rate control. No really other good options for slow his heart rate down. Despite this he seems to be tolerating the rapid heart rate better than expected.  Continue low-dose oral Lasix. He has had urine output with low-dose by mouth.  Suspect that some of his infectious etiology and will and pulmonary issues or driving is tachycardia, therefore reluctant to be overly aggressive.   Marykay Lex, M.D., M.S. Interventional Cardiologist   Pager # [phone] Phone # [phone] [address] [place], [place] [address]

## 2015-10-12 NOTE — Progress Notes (Signed)
PCCM PROGRESS NOTE  ADMISSION DATE: 09/30/2015 CONSULT DATE: 09/30/2015 REFERRING PROVIDER: Dr. Rito Ehrlich  CC: Short of breath  CULTURES: 1/14 Influenza >> negative 1/14 Pneumococcal Ag >> Positive 1/15 Sputum >> oral flora  ANTIBIOTICS: 1/14 Zithromax >> 1/16 1/16 Rocephin >> 1/23  STUDIES: Echo 1/23 >> LVEF 20-25%, severe diffuse hypokinesis. PAP 36 CXR 1/24 >> COPD, no acute changes  EVENTS: 1/14 Admit 1/16 Obtunded, stuck on BiPAP 1/17 Mental status improved 1/18 Back on BiPAP; cardiology consulted for a fib 1/20 - Didn't use BiPAP last night.  Still feels weak, but breathing improved.  SUBJECTIVE/OVERNIGHT/INTERVAL HX Feels worse.  VITAL SIGNS: BP 115/75 mmHg  Pulse 46  Temp(Src) 97.6 F (36.4 C) (Axillary)  Resp 20  Ht  (1.753 m)  Wt 174 lb 14.4 oz (79.334 kg)  BMI 25.82 kg/m2  SpO2 91%  INTAKE/OUTPUT: I/O last 3 completed shifts: In: 240 [P.O.:240] Out: 1725 [Urine:1725]  General: Deconditioned very weak, awake, alert, feels miserable. Tired of being sick HEENT: no sinus tenderness Cardiac: irregular, no MRG Chest: Faint exp wheeze, no crackles. .Abd: Soft, non tender Ext: 1+ edema Neuro: Follows commands. No focal deficits Skin: No rashes  PULMONARY  Recent Labs Lab 10/09/15 1500  PHART 7.401  PCO2ART 57.4*  PO2ART 101*  HCO3 34.9*  TCO2 29.7  O2SAT 97.3    CBC  Recent Labs Lab 10/12/15 0355  HGB 15.2  HCT 48.2  WBC 19.3*  PLT 154    COAGULATION No results for input(s): INR in the last 168 hours.  CARDIAC  No results for input(s): TROPONINI in the last 168 hours. No results for input(s): PROBNP in the last 168 hours.   CHEMISTRY  Recent Labs Lab 10/06/15 0800 10/07/15 0338 10/08/15 0339 10/10/15 0426 10/11/15 0345 10/12/15 0355  NA 135 138 137  --   --  138  K 4.4 4.5 4.6  --   --  4.0  CL 90* 92* 91*  --   --  94*  CO2 35* 35* 36*  --   --  35*  GLUCOSE 211* 203* 194*  --   --  99  BUN 43* 45* 42*  --    --  35*  CREATININE 0.98 1.06 0.96  --   --  0.79  CALCIUM 8.7* 9.1 8.8*  --   --  8.5*  MG  --   --   --  2.3 2.2 2.2  PHOS  --   --   --  2.5 2.7 3.0   Estimated Creatinine Clearance: 67.5 mL/min (by C-G formula based on Cr of 0.79).  ENDOCRINE CBG (last 3)   Recent Labs  10/11/15 1656 10/11/15 2128 10/12/15 0732  GLUCAP 70 123* 105*     IMAGING x48h  - image(s) personally visualized  -   highlighted in bold Dg Chest Port 1 View  10/10/2015  CLINICAL DATA:  Congestion, shortness of Breath EXAM: PORTABLE CHEST 1 VIEW COMPARISON:  10/04/2015 FINDINGS: There is hyperinflation of the lungs compatible with COPD. Left pacer remains in place, unchanged. Mild cardiomegaly. No focal airspace opacities or effusions. Stable right basilar pleural thickening. No acute bony abnormality. IMPRESSION: COPD, cardiomegaly.  No active disease. Electronically Signed   By: Charlett Nose M.D.   On: 10/10/2015 12:56     DISCUSSION: 82 yo male former smoker with cough, dyspnea, wheeze, hypoxia from AECOPD and tracheobronchitis. Not improving much. Suspect that this is mostly d/t his deconditioning in the setting of severe underlying lung disease. Only  interventions are supportive at this point. Cont BDs, slow taper steroids and pulm hygiene.   He has hx of advanced COPD on chronic prednisone, chronic systolic/diastolic CHF with EF 20-25% from Jan 17, Chronic a fib on eliquis, CAD, HTN, HLD, s/p PM, GERD, DM. He is DNR/DNI.  ASSESSMENT/PLAN:  Acute hypoxic/hypercapnic respiratory failure. Plan: - oxygen to keep SpO2 88 to 95% - BiPAP prn >> ->Refusing/ - prn morphine for dyspnea -Comfort main be main goal here.  AECOPD. Tracheobronchitis. Plan: - pulmicort, xopenex, ipratropium - continue solumedrol, flutter valve and incentive spirometer - rocephin completed   Chronic combined CHF. EF 20-25% Hx of A fib, CAD, HTN, HLD. Plan: - per primary team and cardiology(notes reviewed/26)  Acute  encephalopathy likely 2nd to hypercapnia and hospitalization and age and prior hx  - ongoing and hallucinating Plan: - monitor mental status - On scheduled low dose haldol. Follow EKG.  AKI >> improved. Plan: - monitor renal fx while getting lasix  Goals of Care >> DNR/DNI. Likely needs SNF rehab. He is similar to a few years ago when we discussed hospice etc., but he bounced back . He is very deconditioned and may need a palliative care consult. He looks and feels miserable  Brett Canales Kewanda Poland ACNP [facility] Pager [phone] till 3 pm If no answer page [phone] 10/12/2015, 9:16 AM

## 2015-10-13 DIAGNOSIS — J9621 Acute and chronic respiratory failure with hypoxia: Secondary | ICD-10-CM

## 2015-10-13 DIAGNOSIS — Z515 Encounter for palliative care: Secondary | ICD-10-CM

## 2015-10-13 DIAGNOSIS — I255 Ischemic cardiomyopathy: Secondary | ICD-10-CM | POA: Diagnosis present

## 2015-10-13 LAB — GLUCOSE, CAPILLARY
GLUCOSE-CAPILLARY: 98 mg/dL (ref 65–99)
GLUCOSE-CAPILLARY: 188 mg/dL — AB (ref 65–99)
Glucose-Capillary: 64 mg/dL — ABNORMAL LOW (ref 65–99)
Glucose-Capillary: 101 mg/dL — ABNORMAL HIGH (ref 65–99)
Glucose-Capillary: 126 mg/dL — ABNORMAL HIGH (ref 65–99)

## 2015-10-13 LAB — T4, FREE: Free T4: 1.17 ng/dL — ABNORMAL HIGH (ref 0.61–1.12)

## 2015-10-13 LAB — TSH: TSH: 0.541 u[IU]/mL (ref 0.350–4.500)

## 2015-10-13 LAB — MAGNESIUM: Magnesium: 2.3 mg/dL (ref 1.7–2.4)

## 2015-10-13 LAB — PHOSPHORUS: Phosphorus: 3.3 mg/dL (ref 2.5–4.6)

## 2015-10-13 MED ORDER — ARFORMOTEROL TARTRATE 15 MCG/2ML IN NEBU
15.0000 ug | INHALATION_SOLUTION | Freq: Two times a day (BID) | RESPIRATORY_TRACT | Status: DC
  Administered 2015-10-13 – 2015-10-17 (×9): 15 ug via RESPIRATORY_TRACT
  Filled 2015-10-13 (×9): qty 2

## 2015-10-13 MED ORDER — PREDNISONE 50 MG PO TABS
50.0000 mg | ORAL_TABLET | Freq: Every day | ORAL | Status: DC
  Administered 2015-10-14 – 2015-10-15 (×2): 50 mg via ORAL
  Filled 2015-10-13 (×2): qty 1

## 2015-10-13 MED ORDER — LEVALBUTEROL HCL 0.63 MG/3ML IN NEBU: 0.6300 mg | INHALATION_SOLUTION | Freq: Four times a day (QID) | RESPIRATORY_TRACT | Status: DC | PRN

## 2015-10-13 NOTE — Consult Note (Signed)
Consultation Note Date: 10/13/2015   Patient Name: [name]  DOB: Nov 18, 1927  MRN: [id number]  Age / Sex: 82 y.o., male  PCP: Marton Redwood, MD Referring Physician: Florencia Reasons, MD  Reason for Consultation: Establishing goals of care    Clinical Assessment/Narrative: I met today with [name] who is very lethargic. He tells me that he is feeling "a little better" and that his breathing feels less labored. He denies pain/discomfort. Unfortunately he is very lethargic and is falling asleep during our conversation. He does seem to understand how acutely ill he is at this time. I spoke with RN who has cared for him off and on over the past ~1 week. She says that his breathing is less labored but otherwise little change/improvement. Says he will inconsistently eat a little or drink some Ensure - but not enough to maintain. Mr. Baylock gives me permission to discuss with his daughter.   I spoke with [name], his daughter. She has good understanding of his COPD and understands that we are running out of options to "fix" in regards to lungs and heart function. However, she says that she has seen him even more ill than he is now and he always bounces back - even able to return to his apartment at [facility] after rehab and function fairly independently. She is familiar with hospice and how this can be beneficial but is just not convinced that he will not bounce back at this point. We discussed how he would be a candidate for hospice if he does not improve from his current state or declines in any way. We agree to continue watchful waiting over the weekend to see if he comes around and we will speak again on Monday. She agrees she just needs more time to see him declare himself. Please call [phone] with any acute palliative needs over the weekend - otherwise I will speak with them again Monday.   Contacts/Participants  in Discussion: Primary Decision Maker: Self   Relationship to Patient daughter [name]  SUMMARY OF RECOMMENDATIONS - Continue current care - They need a little more time to see if he will not improve - Will further discuss hospice options Monday if no improvement or decline over the weekend - Agree and continue morphine prn for dyspnea/pain (daughter believes this is making him so lethargic - I am not convinced this is the morphine - likely multifactorial)   Code Status/Advance Care Planning: DNR    Code Status Orders        Start     Ordered   09/30/15 0534  Do not attempt resuscitation (DNR)   Continuous    Question Answer Comment  In the event of cardiac or respiratory ARREST Do not call a "code blue"   In the event of cardiac or respiratory ARREST Do not perform Intubation, CPR, defibrillation or ACLS   In the event of cardiac or respiratory ARREST Use medication by any route, position, wound care, and other measures to relive pain and suffering. May use oxygen, suction and manual treatment of airway obstruction as needed for comfort.      09/30/15 0534    Code Status History    Date Active Date Inactive Code Status Order ID Comments User Context   03/27/2014 10:53 PM 03/29/2014  2:51 PM DNR [id number]  [name], MD Inpatient   12/31/2013  5:02 PM 01/06/2014  6:19 PM DNR [id number]  [name], MD ED   09/09/2013  4:03 PM  09/17/2013  2:54 PM DNR [id number]  [name], MD Inpatient   07/09/2013  7:51 PM 07/13/2013  1:34 PM DNR [id number]  [name], MD Inpatient   07/09/2013  1:18 PM 07/09/2013  7:51 PM Full Code [id number]  [name], MD ED   09/28/2012  7:29 AM 10/01/2012  6:12 PM DNR [id number]  [name], MD Inpatient   04/04/2012  1:02 PM 04/14/2012  2:29 PM DNR [id number]  [name], MD ED   04/04/2012 12:57 PM 04/04/2012  1:02 PM DNR [id number]  [name], MD ED   03/10/2012  5:58 PM 03/19/2012  4:40 PM DNR [id number]  [name], RN Inpatient    11/13/2011 11:11 AM 11/15/2011  8:15 PM DNR [id number]  [name], NP Inpatient   11/08/2011  3:03 PM 11/13/2011 11:10 AM Partial Code [id number]  Brand Males, MD Inpatient   11/07/2011  4:13 PM 11/08/2011  3:03 PM Full Code [id number]  [name], RN Inpatient    Advance Directive Documentation        Most Recent Value   Type of Advance Directive  Healthcare Power of Attorney, Living will   Pre-existing out of facility DNR order (yellow form or pink MOST form)     "MOST" Form in Place?         Symptom Management:   Pain/dyspnea: Continue morphine 1 mg every 2 hours prn.   Hallucinations: Haldol 2 mg every 6 hours prn. Concern with QTc with this.  Palliative Prophylaxis:   Bowel Regimen, Delirium Protocol, Frequent Pain Assessment, Oral Care and Turn Reposition  Psycho-social/Spiritual:  Support System: Strong Desire for further Chaplaincy support:no Additional Recommendations: Caregiving  Support/Resources and Education on Hospice  Prognosis: < 4 weeks if no improvement or decline with pulmonary/cardiac decompensation.   Discharge Planning: To be determined. Family hopeful for SNF rehab but I am not sure this is realistic - need more time.    Chief Complaint/ Primary Diagnoses: Present on Admission:  . Chronic combined systolic and diastolic congestive heart failure ([place]) . Emphysema/COPD ([place]) . Pacemaker-St.Jude . Acute-on-chronic respiratory failure ([place]) . Permanent atrial fibrillation ([place]) . Depression . GERD (gastroesophageal reflux disease) . BPH (benign prostatic hyperplasia) . Malnutrition of moderate degree . Atrial fibrillation with RVR ([place]) . COPD with acute exacerbation ([place]) . Cardiomyopathy, ischemic  I have reviewed the medical record, interviewed the patient and family, and examined the patient. The following aspects are pertinent.  Past Medical History  Diagnosis Date  . Pneumonia 12/06/10    healthcare-associated/ left lower lobe  .  COPD (chronic obstructive pulmonary disease) (HCC)     severe stage IV  . Atrial fibrillation ([place])     on Coumadin with St. Jude single-chamber pacemaker  . Diabetes mellitus     type 2; s/p Prednisone therapy for pneumonia 12/2010  . Coronary artery disease     s/p anterior STEMI 09/2009; cath. revealed mid LAD 40%, distal LAD 100%- PTCA, prox. RCA 40%, mid. RCA 100% with good collateral filling of PDA; EF 25%; NSTEMI 07/2011 - PCI/DES 100% LCX  . ST elevation (STEMI) myocardial infarction ([place]) 01//11/12    anterior  . CHF (congestive heart failure) (HCC)     EF 25% on 09/26/10; EF 50-55% and grade 1 diastolic dysfunction on ECHO 08/2010   . Hypertension   . Hyperlipidemia   . AAA (abdominal aortic aneurysm) (HCC)     3 cm infrarenal abdominal aortic aneurysm per aorta ultrasound 03/2010  . Osteoarthritis  s/p bilat hip arthroplasty  . Angina   . Ischemic cardiomyopathy     EF 35% LHC 11/12  . GERD (gastroesophageal reflux disease)   . Hypercholesteremia   . PVD (peripheral vascular disease) ([place])   . Anxiety    Social History   Social History  . Marital Status: Widowed    Spouse Name: N/A  . Number of Children: N/A  . Years of Education: N/A   Social History Main Topics  . Smoking status: Former Smoker -- 2.00 packs/day for 50 years    Types: Cigarettes    Quit date: 09/02/2009  . Smokeless tobacco: Never Used     Comment: smoked for 66yr quit for 171yrstarted back & smoked for 10ys  . Alcohol Use: Yes     Comment: occ - alcohol  . Drug Use: No  . Sexual Activity: Not Currently   Other Topics Concern  . None   Social History Narrative   Family History  Problem Relation Age of Onset  . Heart attack Mother [phone]. Heart attack Father [phone]. Cancer Other     siblings  . Heart attack Other    Scheduled Meds: . antiseptic oral rinse  7 mL Mouth Rinse q12n4p  . apixaban  5 mg Oral BID  . arformoterol  15 mcg Nebulization BID  . bisoprolol  20 mg Oral Daily   . budesonide (PULMICORT) nebulizer solution  0.5 mg Nebulization BID  . chlorhexidine  15 mL Mouth Rinse BID  . dextromethorphan-guaiFENesin  1 tablet Oral BID  . digoxin  0.125 mg Oral Daily  . diltiazem  360 mg Oral Daily  . feeding supplement (ENSURE ENLIVE)  237 mL Oral BID BM  . finasteride  5 mg Oral Daily  . furosemide  20 mg Oral Daily  . insulin aspart  0-20 Units Subcutaneous TID WC  . insulin aspart  0-5 Units Subcutaneous QHS  . insulin glargine  20 Units Subcutaneous BID  . polyethylene glycol  17 g Oral Daily  . [START ON 10/14/2015] predniSONE  50 mg Oral Q breakfast  . rosuvastatin  40 mg Oral Daily  . senna-docusate  1 tablet Oral BID  . sodium chloride  3 mL Intravenous Q12H  . tamsulosin  0.4 mg Oral BID   Continuous Infusions:  PRN Meds:.acetaminophen **OR** acetaminophen, fluticasone, haloperidol lactate, levalbuterol, metoprolol, morphine injection, nitroGLYCERIN, [DISCONTINUED] ondansetron **OR** ondansetron (ZOFRAN) IV Medications Prior to Admission:  Prior to Admission medications   Medication Sig Start Date End Date Taking? Authorizing Provider  albuterol (PROVENTIL) (2.5 MG/3ML) 0.083% nebulizer solution Take 2.5 mg by nebulization every 6 (six) hours as needed for wheezing or shortness of breath.   Yes Historical Provider, MD  ALPRAZolam (XANAX) 0.25 MG tablet Take 1 tablet (0.25 mg total) by mouth at bedtime as needed for anxiety. 07/12/14  Yes MuBrand MalesMD  apixaban (ELIQUIS) 5 MG TABS tablet Take 5 mg by mouth 2 (two) times daily.   Yes Historical Provider, MD  beclomethasone (QVAR) 80 MCG/ACT inhaler Inhale 1 puff into the lungs 2 (two) times daily.   Yes Historical Provider, MD  bisoprolol (ZEBETA) 10 MG tablet Take 10 mg by mouth daily.   Yes Historical Provider, MD  cholecalciferol (VITAMIN D) 1000 UNITS tablet Take 2,000 Units by mouth daily.   Yes Historical Provider, MD  digoxin (LANOXIN) 0.125 MG tablet Take 0.125 mg by mouth daily.   Yes  Historical Provider, MD  diltiazem (DILACOR XR) 240 MG 24  hr capsule Take 240 mg by mouth daily.   Yes Historical Provider, MD  finasteride (PROSCAR) 5 MG tablet Take 5 mg by mouth daily.   Yes Historical Provider, MD  fluticasone (FLONASE) 50 MCG/ACT nasal spray Place 2 sprays into both nostrils daily. Patient taking differently: Place 2 sprays into both nostrils daily as needed for allergies.  01/16/15  Yes Brand Males, MD  furosemide (LASIX) 20 MG tablet Take 20 mg by mouth daily.  06/15/12  Yes Evans Lance, MD  glimepiride (AMARYL) 2 MG tablet Take 1 tablet (2 mg total) by mouth daily with breakfast. 09/17/13  Yes Marton Redwood, MD  guaiFENesin (MUCINEX) 600 MG 12 hr tablet Take 600 mg by mouth at bedtime.    Yes Historical Provider, MD  ipratropium (ATROVENT) 0.02 % nebulizer solution Take 2.5 mLs (0.5 mg total) by nebulization every 6 (six) hours. 09/17/13  Yes Marton Redwood, MD  nitroGLYCERIN (NITROSTAT) 0.4 MG SL tablet Place 0.4 mg under the tongue every 5 (five) minutes as needed (MAX 3 TABLETS). For chest pain   Yes Historical Provider, MD  predniSONE (DELTASONE) 5 MG tablet Take 5 mg by mouth daily with breakfast.   Yes Historical Provider, MD  rosuvastatin (CRESTOR) 40 MG tablet Take 40 mg by mouth daily.   Yes Historical Provider, MD  sennosides-docusate sodium (SENOKOT-S) 8.6-50 MG tablet Take 2 tablets by mouth daily as needed for constipation.    Yes Historical Provider, MD  Tamsulosin HCl (FLOMAX) 0.4 MG CAPS Take 0.4 mg by mouth 2 (two) times daily.    Yes Historical Provider, MD  tiotropium (SPIRIVA HANDIHALER) 18 MCG inhalation capsule Place 18 mcg into inhaler and inhale daily.   Yes Historical Provider, MD  traMADol (ULTRAM) 50 MG tablet Take 50 mg by mouth every 8 (eight) hours as needed. for pain 03/08/15  Yes Historical Provider, MD  morphine 10 MG/5ML solution Take 2 mLs (4 mg total) by mouth 2 (two) times daily as needed (refractory dyspnea). 01/07/14   Tanda Rockers, MD    No Known Allergies  Review of Systems  Unable to perform ROS   Physical Exam  Constitutional: He appears well-developed and well-nourished. He appears lethargic.  HENT:  Head: Normocephalic and atraumatic.  Cardiovascular: An irregular rhythm present. Tachycardia present.   Respiratory: Effort normal. No accessory muscle usage. No tachypnea. No respiratory distress. He has wheezes.  GI: Normal appearance.  Neurological: He appears lethargic.    Vital Signs: BP 127/65 mmHg  Pulse 48  Temp(Src) 97.4 F (36.3 C) (Axillary)  Resp 16  Ht '5\' 9"'$  (1.753 m)  Wt 78.155 kg (172 lb 4.8 oz)  BMI 25.43 kg/m2  SpO2 92%  SpO2: SpO2: 92 % O2 Device:SpO2: 92 % O2 Flow Rate: .O2 Flow Rate (L/min): 4 L/min  IO: Intake/output summary:  Intake/Output Summary (Last 24 hours) at 10/13/15 1444 Last data filed at 10/13/15 0449  Gross per 24 hour  Intake      0 ml  Output    725 ml  Net   -725 ml    LBM: Last BM Date: 10/02/15 Baseline Weight: Weight: 82.4 kg (181 lb 10.5 oz) Most recent weight: Weight: 78.155 kg (172 lb 4.8 oz)      Palliative Assessment/Data:    Additional Data Reviewed:  CBC:    Component Value Date/Time   WBC 19.3* 10/12/2015 0355   HGB 15.2 10/12/2015 0355   HCT 48.2 10/12/2015 0355   PLT 154 10/12/2015 0355   MCV 95.3  10/12/2015 0355   NEUTROABS 7.6 09/30/2015 0300   LYMPHSABS 2.1 09/30/2015 0300   MONOABS 1.1* 09/30/2015 0300   EOSABS 0.0 09/30/2015 0300   BASOSABS 0.0 09/30/2015 0300   Comprehensive Metabolic Panel:    Component Value Date/Time   NA 138 10/12/2015 0355   K 4.0 10/12/2015 0355   CL 94* 10/12/2015 0355   CO2 35* 10/12/2015 0355   BUN 35* 10/12/2015 0355   CREATININE 0.79 10/12/2015 0355   GLUCOSE 99 10/12/2015 0355   CALCIUM 8.5* 10/12/2015 0355   AST 29 10/01/2015 0424   ALT 30 10/01/2015 0424   ALKPHOS 86 10/01/2015 0424   BILITOT 1.0 10/01/2015 0424   PROT 6.3* 10/01/2015 0424   ALBUMIN 3.9 10/01/2015 0424     Time  In: 1400 Time Out: 1520 Time Total: 36mn Greater than 50%  of this time was spent counseling and coordinating care related to the above assessment and plan.  Signed by: PPershing Proud NP  APershing Proud NP  17/61/9509 2:44 PM  Please contact Palliative Medicine Team phone at [phone] questions and concerns.

## 2015-10-13 NOTE — Progress Notes (Signed)
Hypoglycemic Event  CBG: 64  Treatment: 15 GM carbohydrate snack  Symptoms: None  Follow-up CBG: Time:0903 CBG Result:98  Possible Reasons for Event: Unknown  Comments/MD notified:hypoglycemic protocol    [name]

## 2015-10-13 NOTE — Care Management Important Message (Deleted)
Important Message  Patient Details  Name: [name] MRN: [id number] Date of Birth: [birth year]   Medicare Important Message Given:  Yes    Haskell Flirt 10/13/2015, 11:46 AMImportant Message  Patient Details  Name: [name] MRN: [id number] Date of Birth: 1928-09-13   Medicare Important Message Given:  Yes    Haskell Flirt 10/13/2015, 11:46 AM

## 2015-10-13 NOTE — Progress Notes (Signed)
Patient Profile: 82 year old male with hx of severe COPD on chronic O2,  CAF-on Eliquis,  CAD (STEMI in 2011 and 2012 - PCI with DES to LCX, chronic total occl of RCA with Lt->rt collaterals, nonobstructive LAD stenosis) Hx of ICM -CHF with EF 25% (echo 10/09/15), St Jude PTVDP, NIDDM, HLD, and BPH.  Admitted 09/30/15 for COPD exacerbation, found to be in atrial fibrillation w/ RVR.  Subjective: More awake today but says he is "seeing things that aren't there". He thinks he is at [facility]. He appears less SOB.   Objective: Vital signs in last 24 hours: Temp:  [97.1 F (36.2 C)-97.6 F (36.4 C)] 97.1 F (36.2 C) (01/27 1610) Pulse Rate:  [77-125] 111 (01/27 9604) Resp:  [15-20] 15 (01/27 5409) BP: (126-133)/(66-87) 132/66 mmHg (01/27 0638) SpO2:  [90 %-95 %] 90 % (01/27 8119) Weight:  [172 lb 4.8 oz (78.155 kg)] 172 lb 4.8 oz (78.155 kg) (01/27 1478) Last BM Date: 10/02/15  Intake/Output from previous day: 01/26 0701 - 01/27 0700 In: -  Out: 1050 [Urine:1050] Intake/Output this shift:    Medications Current Facility-Administered Medications  Medication Dose Route Frequency Provider Last Rate Last Dose  . acetaminophen (TYLENOL) tablet 650 mg  650 mg Oral Q6H PRN Lorretta Harp, MD       Or  . acetaminophen (TYLENOL) suppository 650 mg  650 mg Rectal Q6H PRN Lorretta Harp, MD      . antiseptic oral rinse (CPC / CETYLPYRIDINIUM CHLORIDE 0.05%) solution 7 mL  7 mL Mouth Rinse q12n4p Hollice Espy, MD   7 mL at 10/12/15 1857  . apixaban (ELIQUIS) tablet 5 mg  5 mg Oral BID Coralyn Helling, MD   5 mg at 10/12/15 2149  . bisoprolol (ZEBETA) tablet 20 mg  20 mg Oral Daily Marykay Lex, MD   20 mg at 10/12/15 1120  . budesonide (PULMICORT) nebulizer solution 0.5 mg  0.5 mg Nebulization BID Coralyn Helling, MD   0.5 mg at 10/13/15 0821  . chlorhexidine (PERIDEX) 0.12 % solution 15 mL  15 mL Mouth Rinse BID Hollice Espy, MD   15 mL at 10/12/15 2144  . dextromethorphan-guaiFENesin (MUCINEX DM)  30-600 MG per 12 hr tablet 1 tablet  1 tablet Oral BID Calvert Cantor, MD   1 tablet at 10/12/15 2149  . digoxin (LANOXIN) tablet 0.125 mg  0.125 mg Oral Daily Coralyn Helling, MD   0.125 mg at 10/12/15 1120  . diltiazem (CARDIZEM CD) 24 hr capsule 360 mg  360 mg Oral Daily Marykay Lex, MD   360 mg at 10/12/15 1857  . feeding supplement (ENSURE ENLIVE) (ENSURE ENLIVE) liquid 237 mL  237 mL Oral BID BM Dionne Ano Ward, RD   237 mL at 10/10/15 1400  . finasteride (PROSCAR) tablet 5 mg  5 mg Oral Daily Coralyn Helling, MD   5 mg at 10/12/15 1118  . fluticasone (FLONASE) 50 MCG/ACT nasal spray 2 spray  2 spray Each Nare Daily PRN Lorretta Harp, MD      . furosemide (LASIX) tablet 20 mg  20 mg Oral Daily Marykay Lex, MD   20 mg at 10/12/15 1119  . haloperidol lactate (HALDOL) injection 2 mg  2 mg Intravenous Q6H PRN Albertine Grates, MD   2 mg at 10/11/15 2202  . insulin aspart (novoLOG) injection 0-20 Units  0-20 Units Subcutaneous TID WC Coralyn Helling, MD   3 Units at 10/12/15 1742  . insulin aspart (novoLOG) injection  0-5 Units  0-5 Units Subcutaneous QHS Coralyn Helling, MD   3 Units at 10/10/15 2259  . insulin glargine (LANTUS) injection 20 Units  20 Units Subcutaneous BID Marinda Elk, MD   20 Units at 10/12/15 2144  . ipratropium (ATROVENT) nebulizer solution 0.5 mg  0.5 mg Nebulization TID Albertine Grates, MD   0.5 mg at 10/13/15 0821  . levalbuterol (XOPENEX) nebulizer solution 0.63 mg  0.63 mg Nebulization TID Albertine Grates, MD   0.63 mg at 10/13/15 1610  . methylPREDNISolone sodium succinate (SOLU-MEDROL) 125 mg/2 mL injection 60 mg  60 mg Intravenous Q12H Marinda Elk, MD   60 mg at 10/12/15 2145  . metoprolol (LOPRESSOR) injection 5 mg  5 mg Intravenous Q4H PRN Marinda Elk, MD   5 mg at 10/05/15 0006  . morphine 2 MG/ML injection 1 mg  1 mg Intravenous Q2H PRN Albertine Grates, MD   1 mg at 10/13/15 0449  . nitroGLYCERIN (NITROSTAT) SL tablet 0.4 mg  0.4 mg Sublingual Q5 min PRN Lorretta Harp, MD      . ondansetron  [facility]) injection 4 mg  4 mg Intravenous Q6H PRN Lorretta Harp, MD   4 mg at 10/05/15 1957  . polyethylene glycol (MIRALAX / GLYCOLAX) packet 17 g  17 g Oral Daily Albertine Grates, MD   17 g at 10/12/15 1930  . rosuvastatin (CRESTOR) tablet 40 mg  40 mg Oral Daily Coralyn Helling, MD   40 mg at 10/12/15 1857  . senna-docusate (Senokot-S) tablet 1 tablet  1 tablet Oral BID Albertine Grates, MD   1 tablet at 10/12/15 2149  . sodium chloride 0.9 % injection 3 mL  3 mL Intravenous Q12H Lorretta Harp, MD   3 mL at 10/12/15 2145  . tamsulosin (FLOMAX) capsule 0.4 mg  0.4 mg Oral BID Coralyn Helling, MD   0.4 mg at 10/12/15 2149    PE: General appearance: alert, cooperative, no distress and using [place]: no carotid bruit and no JVD Lungs: decreased breath sounds, no obvious wheezing Heart: irregularly irregular rhythm and tachy rate Extremities: no LEE Pulses: 2+ and symmetric Skin: warm and dry Neurologic: awake and alert- confused  Lab Results:   Recent Labs  10/12/15 0355  WBC 19.3*  HGB 15.2  HCT 48.2  PLT 154   BMET  Recent Labs  10/12/15 0355  NA 138  K 4.0  CL 94*  CO2 35*  GLUCOSE 99  BUN 35*  CREATININE 0.79  CALCIUM 8.5*   Echo: 10/09/15 Study Conclusions  - Left ventricle: The cavity size was moderately dilated. Systolic function was severely reduced. The estimated ejection fraction was in the range of 20% to 25%. Severe, diffuse hypokinesis. The study is not technically sufficient to allow evaluation of LV diastolic function. - Ventricular septum: Septal motion showed abnormal function and dyssynergy. - Mitral valve: Transvalvular velocity was within the normal range. There was no evidence for stenosis. There was moderate regurgitation. - Left atrium: The atrium was severely dilated. - Right ventricle: The cavity size was normal. Wall thickness was normal. Systolic function was normal. - Right atrium: The atrium was severely dilated. - Atrial septum: No defect or  patent foramen ovale was identified by color flow Doppler. - Tricuspid valve: There was mild regurgitation. - Pulmonary arteries: Systolic pressure was mildly increased. PA peak pressure: 36 mm Hg (S). - Inferior vena cava: The vessel was dilated. The respirophasic diameter changes were blunted (< 50%), consistent with elevated central venous pressure.  Assessment/Plan  Principal Problem:   Acute-on-chronic respiratory failure (HCC) Active Problems:   Chronic combined systolic and diastolic congestive heart failure (HCC)   COPD with acute exacerbation (HCC)   Atrial fibrillation with RVR (HCC)   Emphysema/COPD (HCC)   CAD S/P previous PCI   Type 2 diabetes mellitus (HCC)   Permanent atrial fibrillation (HCC)   Chronic anticoagulation   Cardiomyopathy, ischemic   Pacemaker-St.Jude   Depression   GERD (gastroesophageal reflux disease)   BPH (benign prostatic hyperplasia)   Malnutrition of moderate degree   1. Atrial fib with RVR Rates remain difficult to control, trending in the low 100's to 120's despite max dose Diltiazem, Bisoprolol, and Lanoxin.  CHA2DS2VASc = 6 Eliquis. No significant improvement in rate after IV Amiodarone bolus yesterday.   2. CAD with hx MI; neg troponin, no chest pain.  3. CHF (combined systolic and diastolic, chronic) - . Appears euvolemic -  4. COPD with acute exacerbation- improving  5. Delirium  On Haldol prn.   7. Abnormal TSH- 0.250  8. Severe cardiomyopathy- EF 20-25% 10/09/15- (new drop in LVF c/w 2013)   Plan:  Rate remains uncontrolled despite apparent improvement in COPD exacerbation and max medical Rx. Repeat TSH with free T4.    Corine Shelter PA-C 10/13/2015 8:31 AM   I have seen, examined and evaluated the patient this PM along with Mr. Diona Fanti, [place].  After reviewing all the available data and chart,  I agree with his findings, examination as well as impression recommendations.  Continues to have persistent Afib with RVR  in 90-120 range.  Minimal benefit from IV Amiodarone last PM  Breathing seems to have improved, but remains very weak. Agree with following up Thyroid studies.  He is on Max Dose of CCB & his BB along with Digoxin & still inadequately controlled HR. Some of the HR may be compensatory with low EF, but not this fast. Concern is that continued RVR will worsen CHF. Restarted diuretic @ low dose & has diuresed. If thyroid studies are OK - would consider Amiodarone loading for additional rate control. Besides that, not many more options.   Marykay Lex, M.D., M.S. Interventional Cardiologist   Pager # [phone] Phone # [phone] [address] [place], [place] [address]

## 2015-10-13 NOTE — Progress Notes (Addendum)
PCCM PROGRESS NOTE  ADMISSION DATE: 09/30/2015 CONSULT DATE: 09/30/2015 REFERRING PROVIDER: Dr. Rito Ehrlich  CC: Short of breath  CULTURES: 1/14 Influenza >> negative 1/14 Pneumococcal Ag >> Positive 1/15 Sputum >> oral flora  ANTIBIOTICS: 1/14 Zithromax >> 1/16 1/16 Rocephin >> 1/23  STUDIES: Echo 1/23 >> LVEF 20-25%, severe diffuse hypokinesis. PAP 36 CXR 1/24 >> COPD, no acute changes  EVENTS: 1/14 Admit 1/16 Obtunded, stuck on BiPAP 1/17 Mental status improved 1/18 Back on BiPAP; cardiology consulted for a fib 1/20 - Didn't use BiPAP last night.  Still feels weak, but breathing improved.  SUBJECTIVE/OVERNIGHT/INTERVAL HX Feels better but can't eat  VITAL SIGNS: BP 132/66 mmHg  Pulse 120  Temp(Src) 97.1 F (36.2 C) (Axillary)  Resp 15  Ht  (1.753 m)  Wt 172 lb 4.8 oz (78.155 kg)  BMI 25.43 kg/m2  SpO2 90%  INTAKE/OUTPUT: I/O last 3 completed shifts: In: 60 [P.O.:60] Out: 1550 [Urine:1550]  General: Deconditioned very weak, awake, alert. frustrated HEENT: no sinus tenderness Cardiac: irregular, no MRG Chest: Faint exp wheeze, no crackles. .Abd: Soft, non tender Ext: 1+ edema Neuro: Follows commands. No focal deficits Skin: No rashes  PULMONARY  Recent Labs Lab 10/09/15 1500  PHART 7.401  PCO2ART 57.4*  PO2ART 101*  HCO3 34.9*  TCO2 29.7  O2SAT 97.3    CBC  Recent Labs Lab 10/12/15 0355  HGB 15.2  HCT 48.2  WBC 19.3*  PLT 154    COAGULATION No results for input(s): INR in the last 168 hours.  CARDIAC  No results for input(s): TROPONINI in the last 168 hours. No results for input(s): PROBNP in the last 168 hours.   CHEMISTRY  Recent Labs Lab 10/07/15 0338 10/08/15 0339 10/10/15 0426 10/11/15 0345 10/12/15 0355 10/13/15 0506  NA 138 137  --   --  138  --   K 4.5 4.6  --   --  4.0  --   CL 92* 91*  --   --  94*  --   CO2 35* 36*  --   --  35*  --   GLUCOSE 203* 194*  --   --  99  --   BUN 45* 42*  --   --  35*  --    CREATININE 1.06 0.96  --   --  0.79  --   CALCIUM 9.1 8.8*  --   --  8.5*  --   MG  --   --  2.3 2.2 2.2 2.3  PHOS  --   --  2.5 2.7 3.0 3.3   Estimated Creatinine Clearance: 67.5 mL/min (by C-G formula based on Cr of 0.79).  ENDOCRINE CBG (last 3)   Recent Labs  10/13/15 0730 10/13/15 0903 10/13/15 1151  GLUCAP 64* 98 101*     IMAGING x48h  - image(s) personally visualized  -   highlighted in bold No results found.   DISCUSSION: 82 yo male former smoker with cough, dyspnea, wheeze, hypoxia from AECOPD and tracheobronchitis. Not improving much. Suspect that this is mostly d/t his deconditioning in the setting of severe underlying lung disease. Only interventions are supportive at this point. Cont BDs, slow taper steroids and pulm hygiene.   He has hx of advanced COPD on chronic prednisone, chronic systolic/diastolic CHF with EF 20-25% from Jan 17, Chronic a fib on eliquis, CAD, HTN, HLD, s/p PM, GERD, DM. He is DNR/DNI.  ASSESSMENT/PLAN:  Acute hypoxic/hypercapnic respiratory failure. Plan: - oxygen to keep SpO2 88 to 95% -  prn morphine for dyspnea -Comfort main be main goal here.  AECOPD. Tracheobronchitis. Plan: - pulmicort, xopenex, ipratropium - continue solumedrol, flutter valve and incentive spirometer - rocephin completed   Chronic combined CHF. EF 20-25% Hx of A fib, CAD, HTN, HLD. Plan: - per primary team and cardiology(notes reviewed/26)  Acute encephalopathy likely 2nd to hypercapnia and hospitalization and age and prior hx  - ongoing and hallucinating-->seems to have improved some Plan: - monitor mental status - On scheduled low dose haldol. - cont supportive care   AKI >> improved. Plan: - monitor renal fx while getting lasix  Goals of Care >> DNR/DNI. Likely needs SNF rehab. He is similar to a few years ago when we discussed hospice etc., but he bounced back . He is very deconditioned and may need a palliative care consult. He looks and  feels miserable but has rallied a little. If he can increase his caloric needs he may yet improve. There is nothing else from a pulmonary stand-point we can offer.   Simonne Martinet ACNP-BC [facility] Pulmonary/Critical Care Pager # [phone] OR # [phone] if no answer   10/13/2015, 1:51 PM

## 2015-10-13 NOTE — Progress Notes (Signed)
Patient had 18 beats of v.tach. Patient was asymptomatic, BP stable. Donnamarie Poag NP was notified. No new orders made.

## 2015-10-13 NOTE — Progress Notes (Signed)
[facility] PROGRESS NOTE    Progress Note   [name] WUJ:[id number] DOB: Jul 25, 1928 DOA: 09/30/2015 PCP: Martha Clan, MD   Brief Narrative:   [name] is an 82 y.o. male With past medical history of advanced COPD on home oxygen, diabetes mellitus, CAD atrial fibrillation on apixiban chronic systolic and diastolic heart failure comes to the ED complaining of cough and shortness of breath. She was found to be in acute COPD exacerbation placed on BiPAP and admitted to the hospital. She was started on IV Solu-Medrol steroids and PCCM was consulted. Patient  Cannot tolerate BiPAP when necessary.  Assessment/Plan:   Acute-on-chronic respiratory failure (HCC) Due to acute COPD exacerbation/acute metabolic encephalopathy: PCCM was consulted and recommended to continue BiPAP and IV morphine.   he is on chronic prednisone at home, currently on  IV steroids for an additional 24 hours a repeat a chest x-ray does not show any acute findings. As per critical care he is similar as he was a few years ago when they discuss hospice but he seems to bounce back. Dr Robb Matar d/w the patient again he would like to cont aggressive treatment.  not making progress,  pulm recommended hospice care. Palliative care consulted  Chronic, systolic and diastolic heart failure: Seems to be euvolemic, continue current regimen. The echo was done that shows an ejection fraction of 25%  CAD: Asymptomatic continue medical management.  Atrial Fibrillation with RVR CHA2DS2-VASc Score is 6. Patient is on Eliquis. Heart rate is hard to control continues to be in mild RVR Cardiology on board, appreciate assistance, heart rate continues to improve. Continues to be in RVR despite increasing diltiazem. Appreciate cardiology's assistance.  Controlled diabetes mellitus type 2: A1c was 6.7. Titrate Lantus, his blood glucose continues to be high likely due to steroids.  Acute confusional state: Resolved  continue Haldol when necessary.   BPH: Continue Flomax and Proscar  Constipation: started stool regimen  Malnutrition of moderate degree  Agitation: has required prn haldol and morphine, daughter concerned that patient might be too sedated from iv morphine,  iv morphine decreased to  prn, change haldol to prn dose     DVT Prophylaxis - Lovenox ordered.  Family Communication: patient  Disposition Plan: Pulmonary recommended palliative care and signed off Code Status:     Code Status Orders        Start     Ordered   09/30/15 0534  Do not attempt resuscitation (DNR)   Continuous    Question Answer Comment  In the event of cardiac or respiratory ARREST Do not call a "code blue"   In the event of cardiac or respiratory ARREST Do not perform Intubation, CPR, defibrillation or ACLS   In the event of cardiac or respiratory ARREST Use medication by any route, position, wound care, and other measures to relive pain and suffering. May use oxygen, suction and manual treatment of airway obstruction as needed for comfort.      09/30/15 0534    Code Status History    Date Active Date Inactive Code Status Order ID Comments User Context   03/27/2014 10:53 PM 03/29/2014  2:51 PM DNR [id number]  Alysia Penna, MD Inpatient   12/31/2013  5:02 PM 01/06/2014  6:19 PM DNR [id number]  Marrian Salvage, MD ED   09/09/2013  4:03 PM 09/17/2013  2:54 PM DNR [id number]  Hoyle Sauer, MD Inpatient   07/09/2013  7:51 PM 07/13/2013  1:34 PM DNR [id number]  Jarome Matin, MD  Inpatient   07/09/2013  1:18 PM 07/09/2013  7:51 PM Full Code [id number]  Jarome Matin, MD ED   09/28/2012  7:29 AM 10/01/2012  6:12 PM DNR [id number]  Kari Baars, MD Inpatient   04/04/2012  1:02 PM 04/14/2012  2:29 PM DNR [id number]  Jarome Matin, MD ED   04/04/2012 12:57 PM 04/04/2012  1:02 PM DNR [id number]  Jarome Matin, MD ED   03/10/2012  5:58 PM 03/19/2012  4:40 PM DNR [id number]  Tammy Sours, RN Inpatient   11/13/2011 11:11 AM  11/15/2011  8:15 PM DNR [id number]  Lorinda Creed, NP Inpatient   11/08/2011  3:03 PM 11/13/2011 11:10 AM Partial Code [id number]  Kalman Shan, MD Inpatient   11/07/2011  4:13 PM 11/08/2011  3:03 PM Full Code [id number]  Theora Gianotti, RN Inpatient    Advance Directive Documentation        Most Recent Value   Type of Advance Directive  Healthcare Power of Attorney, Living will   Pre-existing out of facility DNR order (yellow form or pink MOST form)     "MOST" Form in Place?          IV Access:    Peripheral IV   Procedures and diagnostic studies:   No results found.   Medical Consultants:    None.  Anti-Infectives:   Anti-infectives    Start     Dose/Rate Route Frequency Ordered Stop   10/02/15 1000  cefTRIAXone (ROCEPHIN) 1 g in dextrose 5 % 50 mL IVPB  Status:  Discontinued     1 g 100 mL/hr over 30 Minutes Intravenous Every 24 hours 10/02/15 0912 10/09/15 1301   10/01/15 1000  azithromycin (ZITHROMAX) tablet 250 mg  Status:  Discontinued     250 mg Oral Daily 09/30/15 0530 10/02/15 0920   09/30/15 1000  azithromycin (ZITHROMAX) tablet 500 mg     500 mg Oral Daily 09/30/15 0530 09/30/15 0944      Subjective:    [name]  Very frail,  Patient does not want to be bothered.  Objective:    Filed Vitals:   10/13/15 0827 10/13/15 1105 10/13/15 1351 10/13/15 1439  BP:    127/65  Pulse:  120  48  Temp:    97.4 F (36.3 C)  TempSrc:    Axillary  Resp:    16  Height:      Weight:      SpO2: 90%  9% 92%    Intake/Output Summary (Last 24 hours) at 10/13/15 1856 Last data filed at 10/13/15 0449  Gross per 24 hour  Intake      0 ml  Output    400 ml  Net   -400 ml   Filed Weights   10/11/15 0544 10/12/15 0610 10/13/15 0638  Weight: 78.563 kg (173 lb 3.2 oz) 79.334 kg (174 lb 14.4 oz) 78.155 kg (172 lb 4.8 oz)    Exam: Gen:  Very frail, audible gurgling, lethargic with intermittent agitation Cardiovascular:  RRR. Chest and lungs:   Overall  diminished, no wheezing today Abdomen:  Abdomen soft, NT/ND, + BS Extremities:  No edema. Skin: diffuse erythema upper extremity, worse on the left.   Data Reviewed:    Labs: Basic Metabolic Panel:  Recent Labs Lab 10/07/15 0338 10/08/15 0339 10/10/15 0426 10/11/15 0345 10/12/15 0355 10/13/15 0506  NA 138 137  --   --  138  --   K 4.5 4.6  --   --  4.0  --  CL 92* 91*  --   --  94*  --   CO2 35* 36*  --   --  35*  --   GLUCOSE 203* 194*  --   --  99  --   BUN 45* 42*  --   --  35*  --   CREATININE 1.06 0.96  --   --  0.79  --   CALCIUM 9.1 8.8*  --   --  8.5*  --   MG  --   --  2.3 2.2 2.2 2.3  PHOS  --   --  2.5 2.7 3.0 3.3   GFR Estimated Creatinine Clearance: 67.5 mL/min (by C-G formula based on Cr of 0.79). Liver Function Tests: No results for input(s): AST, ALT, ALKPHOS, BILITOT, PROT, ALBUMIN in the last 168 hours. No results for input(s): LIPASE, AMYLASE in the last 168 hours. No results for input(s): AMMONIA in the last 168 hours. Coagulation profile No results for input(s): INR, PROTIME in the last 168 hours.  CBC:  Recent Labs Lab 10/12/15 0355  WBC 19.3*  HGB 15.2  HCT 48.2  MCV 95.3  PLT 154   Cardiac Enzymes: No results for input(s): CKTOTAL, CKMB, CKMBINDEX, TROPONINI in the last 168 hours. BNP (last 3 results) No results for input(s): PROBNP in the last 8760 hours. CBG:  Recent Labs Lab 10/12/15 2110 10/13/15 0730 10/13/15 0903 10/13/15 1151 10/13/15 1710  GLUCAP 85 64* 98 101* 126*   D-Dimer: No results for input(s): DDIMER in the last 72 hours. Hgb A1c: No results for input(s): HGBA1C in the last 72 hours. Lipid Profile: No results for input(s): CHOL, HDL, LDLCALC, TRIG, CHOLHDL, LDLDIRECT in the last 72 hours. Thyroid function studies:  Recent Labs  10/13/15 0938  TSH 0.541   Anemia work up: No results for input(s): VITAMINB12, FOLATE, FERRITIN, TIBC, IRON, RETICCTPCT in the last 72 hours. Sepsis Labs:  Recent  Labs Lab 10/12/15 0355  WBC 19.3*   Microbiology No results found for this or any previous visit (from the past 240 hour(s)).   Medications:   . antiseptic oral rinse  7 mL Mouth Rinse q12n4p  . apixaban  5 mg Oral BID  . arformoterol  15 mcg Nebulization BID  . bisoprolol  20 mg Oral Daily  . budesonide (PULMICORT) nebulizer solution  0.5 mg Nebulization BID  . chlorhexidine  15 mL Mouth Rinse BID  . dextromethorphan-guaiFENesin  1 tablet Oral BID  . digoxin  0.125 mg Oral Daily  . diltiazem  360 mg Oral Daily  . feeding supplement (ENSURE ENLIVE)  237 mL Oral BID BM  . finasteride  5 mg Oral Daily  . furosemide  20 mg Oral Daily  . insulin aspart  0-20 Units Subcutaneous TID WC  . insulin aspart  0-5 Units Subcutaneous QHS  . insulin glargine  20 Units Subcutaneous BID  . polyethylene glycol  17 g Oral Daily  . [START ON 10/14/2015] predniSONE  50 mg Oral Q breakfast  . rosuvastatin  40 mg Oral Daily  . senna-docusate  1 tablet Oral BID  . sodium chloride  3 mL Intravenous Q12H  . tamsulosin  0.4 mg Oral BID   Continuous Infusions:   Time spent: 25 min   LOS: 13 days   Delesha Pohlman MD PhD  [facility] Pager [phone]  *Please refer to [email or web address], password [email or web address] to get updated schedule on who will round on this patient, as hospitalists switch teams weekly. If 7PM-7AM, please contact night-coverage  at [email or web address], password [email or web address] for any overnight needs.  10/13/2015, 6:56 PM

## 2015-10-13 NOTE — Clinical Social Work Placement (Signed)
CSW confirmed with Jasmine December at [facility] that they would be able to take patient over the weekend if ready.   Please call weekend CSW (ph#: [phone]) to facilitate discharge.      [name], LCSW [facility] Clinical Social Worker cell #: [phone]   CLINICAL SOCIAL WORK PLACEMENT  NOTE  Date:  10/13/2015  Patient Details  Name: [name] MRN: [id number] Date of Birth: [birth year]  Clinical Social Work is seeking post-discharge placement for this patient at the Skilled  Nursing Facility level of care (*CSW will initial, date and re-position this form in  chart as items are completed):  Yes   Patient/family provided with [facility] Clinical Social Work Department's list of facilities offering this level of care within the geographic area requested by the patient (or if unable, by the patient's family).  Yes   Patient/family informed of their freedom to choose among providers that offer the needed level of care, that participate in Medicare, Medicaid or managed care program needed by the patient, have an available bed and are willing to accept the patient.  Yes   Patient/family informed of [facility]'s ownership interest in [facility] and [facility], as well as of the fact that they are under no obligation to receive care at these facilities.  PASRR submitted to EDS on 10/10/15     PASRR number received on 10/10/15     Existing PASRR number confirmed on       FL2 transmitted to all facilities in geographic area requested by pt/family on 10/10/15     FL2 transmitted to all facilities within larger geographic area on       Patient informed that his/her managed care company has contracts with or will negotiate with certain facilities, including the following:        Yes   Patient/family informed of bed offers received.  Patient chooses bed at [facility]     Physician recommends and patient chooses bed at      Patient to be transferred  to [facility] on  .  Patient to be transferred to facility by       Patient family notified on   of transfer.  Name of family member notified:        PHYSICIAN       Additional Comment:    _______________________________________________ Arlyss Repress, LCSW 10/13/2015, 3:54 PM

## 2015-10-13 NOTE — Care Management Important Message (Signed)
Important Message  Patient Details  Name: [name] MRN: [id number] Date of Birth: [birth year]   Medicare Important Message Given:  Yes    Haskell Flirt 10/13/2015, 11:44 AMImportant Message  Patient Details  Name: [name] MRN: [id number] Date of Birth: [birth year]   Medicare Important Message Given:  Yes    Haskell Flirt 10/13/2015, 11:43 AM

## 2015-10-13 NOTE — Progress Notes (Signed)
Nutrition Follow-up  DOCUMENTATION CODES:   Non-severe (moderate) malnutrition in context of chronic illness  INTERVENTION:  - Continue Ensure Enlive po BID, each supplement provides 350 kcal and 20 grams of protein - Will order [employer] BD with meals, each supplement provides 290 kcal and 9 grams of protein - If pt's appetite and intakes remain decreased, and dependent on GOC, may benefit from trial of appetite stimulant - RD will continue to monitor for needs  NUTRITION DIAGNOSIS:   Inadequate oral intake related to other (see comment) (SOB) as evidenced by meal completion < 25%. -ongoing  GOAL:   Patient will meet greater than or equal to 90% of their needs -unmet  MONITOR:   PO intake, Supplement acceptance, Labs, Weight trends, Skin, I & O's  REASON FOR ASSESSMENT:   Consult Assessment of nutrition requirement/status  ASSESSMENT:   82 year old Caucasian male with a past medical history of advanced COPD not on home oxygen, hypertension, hyperlipidemia, diabetes, coronary artery disease, atrial fibrillation on anticoagulation, come mind, systolic and diastolic congestive heart failure, presented with cough and shortness of breath. He was found to have acute COPD exacerbation. He was placed on BiPAP and was admitted to the hospital.  1/27 New consult for assessment received. Pt unable to provide information at this time. Pt already ordered Ensure Enlive. Will trial Magic Cup to determine if he likes this supplement. Pt continues with 25% intakes of meals. Per pulmonary NP note today at 1354: He is very deconditioned and may need a palliative care consult.   Pt is not meeting needs. Will monitor GOC. Pt seen by SLP on 1/25 who recommended current diet order. Softer foods beneficial to pt from a respiratory standpoint as well as they may be easier for him to tolerate and not cause increased SOB from energy to chew. Medications reviewed. Labs reviewed; CBGs: 64-296 mg/dL, Cl: 94  mmol/L, BUN: 35 mg/dL, Ca: 8.5 mg/dL.   1/24 Per chart review, pt ate 75% of breakfast 1/22; 25% of breakfast yesterday and breakfast and lunch today. Pt drowsy during RD visit. He indicates that he ate lunch but is unable to recall what he had to eat. Pt unable to provide any other information at this time. Notes indicate that pt has had acute confusion/delirium.  Per chart review, pt has lost 12 lbs (6.5% body weight) in the past 13 days which is significant for time frame. No diuretics ordered at this time; will continue to monitor weight trends. Ensure Enlive ordered during last assessment. Unsure if pt is consuming supplements at this time.  Likely not meeting needs. Medications reviewed. Labs reviewed; CBGs: 143-293 mg/dL, Cl: 91 mmol/L, BUN: 42 mg/dL, Ca: 8.8 mg/dL.   1/20 - Per cardiology note, pt has not had a BM thus far.  - Breathing improving, pt is off BiPaP. - Pt appears to work hard to breathe at this point; was on [place] during visit. - PO intake 15-50% during past 4 meals, per chart.  - Per patient, he is completing at least half of the meals.  - Will order Ensure to help pt get more calories.  1/15 - Pt in room with BiPAP on.  - Pt with SOB today and reports eating very little this past week d/t SOB.  - Pt states he ate a few bites of peaches and a sip of tea of his lunch tray. - He has not eaten anything for breakfast.  - Pt declines supplements at this time.  - UBW is 180  lb. Weight has been stable.  - Nutrition-Focused physical exam completed. Findings are mild-moderate fat depletion, mild-moderate muscle depletion, and no edema.   Diet Order:  DIET DYS 3 Room service appropriate?: Yes; Fluid consistency:: Thin  Skin:  Reviewed, no issues  Last BM:  1/16  Height:   Ht Readings from Last 1 Encounters:  09/30/15  (1.753 m)    Weight:   Wt Readings from Last 1 Encounters:  10/13/15 172 lb 4.8 oz (78.155 kg)    Ideal Body Weight:  72.7  kg  BMI:  Body mass index is 25.43 kg/(m^2).  Estimated Nutritional Needs:   Kcal:  2300-2500  Protein:  115-125g  Fluid:  2.4L/day  EDUCATION NEEDS:   No education needs identified at this time     Trenton Gammon, RD, LDN Inpatient Clinical Dietitian Pager # [phone] After hours/weekend pager # [phone]

## 2015-10-14 DIAGNOSIS — Z515 Encounter for palliative care: Secondary | ICD-10-CM

## 2015-10-14 LAB — URINALYSIS, ROUTINE W REFLEX MICROSCOPIC
Bilirubin Urine: NEGATIVE
Glucose, UA: NEGATIVE mg/dL
Ketones, ur: NEGATIVE mg/dL
LEUKOCYTES UA: NEGATIVE
NITRITE: NEGATIVE
PH: 6 (ref 5.0–8.0)
Protein, ur: 100 mg/dL — AB
SPECIFIC GRAVITY, URINE: 1.022 (ref 1.005–1.030)

## 2015-10-14 LAB — URINE MICROSCOPIC-ADD ON: WBC UA: NONE SEEN WBC/hpf (ref 0–5)

## 2015-10-14 LAB — T3: T3, Total: 50 ng/dL — ABNORMAL LOW (ref 71–180)

## 2015-10-14 LAB — GLUCOSE, CAPILLARY
GLUCOSE-CAPILLARY: 151 mg/dL — AB (ref 65–99)
Glucose-Capillary: 78 mg/dL (ref 65–99)
Glucose-Capillary: 90 mg/dL (ref 65–99)

## 2015-10-14 MED ORDER — AMIODARONE HCL 150 MG/3ML IV SOLN
150.0000 mg | Freq: Once | INTRAVENOUS | Status: DC
  Filled 2015-10-14: qty 3

## 2015-10-14 MED ORDER — AMIODARONE IV BOLUS ONLY 150 MG/100ML
150.0000 mg | Freq: Once | INTRAVENOUS | Status: AC
  Administered 2015-10-14: 150 mg via INTRAVENOUS
  Filled 2015-10-14: qty 100

## 2015-10-14 NOTE — Progress Notes (Signed)
[facility] PROGRESS NOTE    Progress Note   [name] ZOX:[id number] DOB: May 27, 1928 DOA: 09/30/2015 PCP: Martha Clan, MD   Brief Narrative:   [name] is an 82 y.o. male With past medical history of advanced COPD on home oxygen, diabetes mellitus, CAD atrial fibrillation on apixiban chronic systolic and diastolic heart failure comes to the ED complaining of cough and shortness of breath. She was found to be in acute COPD exacerbation placed on BiPAP and admitted to the hospital. She was started on IV Solu-Medrol steroids and PCCM was consulted. Patient  Cannot tolerate BiPAP when necessary.  Assessment/Plan:   Acute-on-chronic respiratory failure (HCC) Due to acute COPD exacerbation/acute metabolic encephalopathy: PCCM was consulted and recommended to continue BiPAP and IV morphine.   he is on chronic prednisone at home, currently on  IV steroids for an additional 24 hours a repeat a chest x-ray does not show any acute findings. As per critical care he is similar as he was a few years ago when they discuss hospice but he seems to bounce back. Dr Robb Matar d/w the patient again he would like to cont aggressive treatment.  not making progress,  pulm recommended hospice care. Palliative care consulted  Atrial Fibrillation with RVR CHA2DS2-VASc Score is 6. Patient is on Eliquis. Heart rate is hard to control continues to be in mild RVR Cardiology on board, appreciate assistance, heart rate continues to improve. Continues to be in RVR despite increasing diltiazem. Appreciate cardiology's assistance. Continue adjust med, heart rate difficult to control.  Chronic, systolic and diastolic heart failure: Seems to be euvolemic, continue current regimen. The echo was done that shows an ejection fraction of 25%  CAD: Asymptomatic continue medical management.   Controlled diabetes mellitus type 2: A1c was 6.7. Titrate Lantus, his blood glucose continues to be high likely  due to steroids.  Acute confusional state: Resolved continue Haldol when necessary.   BPH: Continue Flomax and Proscar  Constipation: started stool regimen  Malnutrition of moderate degree  Agitation: has required prn haldol and morphine, daughter concerned that patient might be too sedated from iv morphine,  iv morphine decreased to  prn, change haldol to prn dose     DVT Prophylaxis - Lovenox ordered.  Family Communication: patient  Disposition Plan: Pulmonary recommended palliative care and signed off Code Status:     Code Status Orders        Start     Ordered   09/30/15 0534  Do not attempt resuscitation (DNR)   Continuous    Question Answer Comment  In the event of cardiac or respiratory ARREST Do not call a "code blue"   In the event of cardiac or respiratory ARREST Do not perform Intubation, CPR, defibrillation or ACLS   In the event of cardiac or respiratory ARREST Use medication by any route, position, wound care, and other measures to relive pain and suffering. May use oxygen, suction and manual treatment of airway obstruction as needed for comfort.      09/30/15 0534    Code Status History    Date Active Date Inactive Code Status Order ID Comments User Context   03/27/2014 10:53 PM 03/29/2014  2:51 PM DNR [id number]  Alysia Penna, MD Inpatient   12/31/2013  5:02 PM 01/06/2014  6:19 PM DNR [id number]  Marrian Salvage, MD ED   09/09/2013  4:03 PM 09/17/2013  2:54 PM DNR [id number]  Hoyle Sauer, MD Inpatient   07/09/2013  7:51 PM 07/13/2013  1:34 PM DNR [id number]  Jarome Matin, MD Inpatient   07/09/2013  1:18 PM 07/09/2013  7:51 PM Full Code [id number]  Jarome Matin, MD ED   09/28/2012  7:29 AM 10/01/2012  6:12 PM DNR [id number]  Kari Baars, MD Inpatient   04/04/2012  1:02 PM 04/14/2012  2:29 PM DNR [id number]  Jarome Matin, MD ED   04/04/2012 12:57 PM 04/04/2012  1:02 PM DNR [id number]  Jarome Matin, MD ED   03/10/2012  5:58 PM 03/19/2012  4:40 PM DNR [id number]   Tammy Sours, RN Inpatient   11/13/2011 11:11 AM 11/15/2011  8:15 PM DNR [id number]  Lorinda Creed, NP Inpatient   11/08/2011  3:03 PM 11/13/2011 11:10 AM Partial Code [id number]  Kalman Shan, MD Inpatient   11/07/2011  4:13 PM 11/08/2011  3:03 PM Full Code [id number]  Theora Gianotti, RN Inpatient    Advance Directive Documentation        Most Recent Value   Type of Advance Directive  Healthcare Power of Attorney, Living will   Pre-existing out of facility DNR order (yellow form or pink MOST form)     "MOST" Form in Place?          IV Access:    Peripheral IV   Procedures and diagnostic studies:   No results found.   Medical Consultants:    None.  Anti-Infectives:   Anti-infectives    Start     Dose/Rate Route Frequency Ordered Stop   10/02/15 1000  cefTRIAXone (ROCEPHIN) 1 g in dextrose 5 % 50 mL IVPB  Status:  Discontinued     1 g 100 mL/hr over 30 Minutes Intravenous Every 24 hours 10/02/15 0912 10/09/15 1301   10/01/15 1000  azithromycin (ZITHROMAX) tablet 250 mg  Status:  Discontinued     250 mg Oral Daily 09/30/15 0530 10/02/15 0920   09/30/15 1000  azithromycin (ZITHROMAX) tablet 500 mg     500 mg Oral Daily 09/30/15 0530 09/30/15 0944      Subjective:    [name]  Very frail,  Seems less agitated this am  Objective:    Filed Vitals:   10/14/15 0625 10/14/15 0920 10/14/15 1409 10/14/15 1417  BP: 115/86  104/74   Pulse: 62  96   Temp: 97.9 F (36.6 C)  97.6 F (36.4 C) 99.5 F (37.5 C)  TempSrc: Axillary  Axillary   Resp: 17  18   Height:      Weight:      SpO2: 95% 90% 96%     Intake/Output Summary (Last 24 hours) at 10/14/15 1717 Last data filed at 10/14/15 0938  Gross per 24 hour  Intake    340 ml  Output   1550 ml  Net  -1210 ml   Filed Weights   10/12/15 0610 10/13/15 0638 10/14/15 0500  Weight: 79.334 kg (174 lb 14.4 oz) 78.155 kg (172 lb 4.8 oz) 77.1 kg (169 lb 15.6 oz)    Exam: Gen:  Very frail, no gurgling heard  today, lethargic with intermittent agitation Cardiovascular:  RRR. Chest and lungs:   Overall diminished, no wheezing today Abdomen:  Abdomen soft, NT/ND, + BS Extremities:  No edema. Skin: diffuse erythema upper extremity, worse on the left.   Data Reviewed:    Labs: Basic Metabolic Panel:  Recent Labs Lab 10/08/15 0339 10/10/15 0426 10/11/15 0345 10/12/15 0355 10/13/15 0506  NA 137  --   --  138  --   K 4.6  --   --  4.0  --   CL 91*  --   --  94*  --   CO2 36*  --   --  35*  --   GLUCOSE 194*  --   --  99  --   BUN 42*  --   --  35*  --   CREATININE 0.96  --   --  0.79  --   CALCIUM 8.8*  --   --  8.5*  --   MG  --  2.3 2.2 2.2 2.3  PHOS  --  2.5 2.7 3.0 3.3   GFR Estimated Creatinine Clearance: 67.5 mL/min (by C-G formula based on Cr of 0.79). Liver Function Tests: No results for input(s): AST, ALT, ALKPHOS, BILITOT, PROT, ALBUMIN in the last 168 hours. No results for input(s): LIPASE, AMYLASE in the last 168 hours. No results for input(s): AMMONIA in the last 168 hours. Coagulation profile No results for input(s): INR, PROTIME in the last 168 hours.  CBC:  Recent Labs Lab 10/12/15 0355  WBC 19.3*  HGB 15.2  HCT 48.2  MCV 95.3  PLT 154   Cardiac Enzymes: No results for input(s): CKTOTAL, CKMB, CKMBINDEX, TROPONINI in the last 168 hours. BNP (last 3 results) No results for input(s): PROBNP in the last 8760 hours. CBG:  Recent Labs Lab 10/13/15 1151 10/13/15 1710 10/13/15 2159 10/14/15 0735 10/14/15 1147  GLUCAP 101* 126* 188* 78 151*   D-Dimer: No results for input(s): DDIMER in the last 72 hours. Hgb A1c: No results for input(s): HGBA1C in the last 72 hours. Lipid Profile: No results for input(s): CHOL, HDL, LDLCALC, TRIG, CHOLHDL, LDLDIRECT in the last 72 hours. Thyroid function studies:  Recent Labs  10/13/15 0938  TSH 0.541   Anemia work up: No results for input(s): VITAMINB12, FOLATE, FERRITIN, TIBC, IRON, RETICCTPCT in the last  72 hours. Sepsis Labs:  Recent Labs Lab 10/12/15 0355  WBC 19.3*   Microbiology No results found for this or any previous visit (from the past 240 hour(s)).   Medications:   . antiseptic oral rinse  7 mL Mouth Rinse q12n4p  . apixaban  5 mg Oral BID  . arformoterol  15 mcg Nebulization BID  . bisoprolol  20 mg Oral Daily  . budesonide (PULMICORT) nebulizer solution  0.5 mg Nebulization BID  . chlorhexidine  15 mL Mouth Rinse BID  . dextromethorphan-guaiFENesin  1 tablet Oral BID  . digoxin  0.125 mg Oral Daily  . diltiazem  360 mg Oral Daily  . feeding supplement (ENSURE ENLIVE)  237 mL Oral BID BM  . finasteride  5 mg Oral Daily  . furosemide  20 mg Oral Daily  . insulin aspart  0-20 Units Subcutaneous TID WC  . insulin aspart  0-5 Units Subcutaneous QHS  . insulin glargine  20 Units Subcutaneous BID  . polyethylene glycol  17 g Oral Daily  . predniSONE  50 mg Oral Q breakfast  . rosuvastatin  40 mg Oral Daily  . senna-docusate  1 tablet Oral BID  . sodium chloride  3 mL Intravenous Q12H  . tamsulosin  0.4 mg Oral BID   Continuous Infusions:   Time spent: 25 min   LOS: 14 days   Shoshana Johal MD PhD  [facility] Pager [phone]  *Please refer to [email or web address], password [email or web address] to get updated schedule on who will round on this patient, as hospitalists switch teams weekly. If 7PM-7AM, please contact night-coverage at [email or web address], password [email or web address] for any overnight needs.  10/14/2015,  5:17 PM

## 2015-10-14 NOTE — Progress Notes (Signed)
Patient Profile: 82 year old male with hx of severe COPD on chronic O2,  CAF-on Eliquis,  CAD (STEMI in 2011 and 2012 - PCI with DES to LCX, chronic total occl of RCA with Lt->rt collaterals, nonobstructive LAD stenosis) Hx of ICM -CHF with EF 25% (echo 10/09/15), St Jude PTVDP, NIDDM, HLD, and BPH.  Admitted 09/30/15 for COPD exacerbation, found to be in atrial fibrillation w/ RVR.  Subjective: Delirious today, but then opens eyes and is totally coherent. C/o chronic pain. Says "I'm not afraid to die".  Objective: Vital signs in last 24 hours: Temp:  [97.4 F (36.3 C)-97.9 F (36.6 C)] 97.9 F (36.6 C) (01/28 0625) Pulse Rate:  [48-120] 62 (01/28 0625) Resp:  [16-17] 17 (01/28 0625) BP: (90-127)/(65-86) 115/86 mmHg (01/28 0625) SpO2:  [9 %-95 %] 95 % (01/28 0625) Weight:  [169 lb 15.6 oz (77.1 kg)] 169 lb 15.6 oz (77.1 kg) (01/28 0500) Last BM Date: 10/02/15  Intake/Output from previous day: 01/27 0701 - 01/28 0700 In: 240 [P.O.:240] Out: 1550 [Urine:1550] Intake/Output this shift:    Medications Current Facility-Administered Medications  Medication Dose Route Frequency Provider Last Rate Last Dose  . acetaminophen (TYLENOL) tablet 650 mg  650 mg Oral Q6H PRN Lorretta Harp, MD       Or  . acetaminophen (TYLENOL) suppository 650 mg  650 mg Rectal Q6H PRN Lorretta Harp, MD      . antiseptic oral rinse (CPC / CETYLPYRIDINIUM CHLORIDE 0.05%) solution 7 mL  7 mL Mouth Rinse q12n4p Hollice Espy, MD   7 mL at 10/13/15 1744  . apixaban (ELIQUIS) tablet 5 mg  5 mg Oral BID Coralyn Helling, MD   5 mg at 10/13/15 2239  . arformoterol (BROVANA) nebulizer solution 15 mcg  15 mcg Nebulization BID Simonne Martinet, NP   15 mcg at 10/13/15 2042  . bisoprolol (ZEBETA) tablet 20 mg  20 mg Oral Daily Marykay Lex, MD   20 mg at 10/13/15 1107  . budesonide (PULMICORT) nebulizer solution 0.5 mg  0.5 mg Nebulization BID Coralyn Helling, MD   0.5 mg at 10/13/15 2042  . chlorhexidine (PERIDEX) 0.12 %  solution 15 mL  15 mL Mouth Rinse BID Hollice Espy, MD   15 mL at 10/13/15 2239  . dextromethorphan-guaiFENesin (MUCINEX DM) 30-600 MG per 12 hr tablet 1 tablet  1 tablet Oral BID Calvert Cantor, MD   1 tablet at 10/13/15 2239  . digoxin (LANOXIN) tablet 0.125 mg  0.125 mg Oral Daily Coralyn Helling, MD   0.125 mg at 10/13/15 1105  . diltiazem (CARDIZEM CD) 24 hr capsule 360 mg  360 mg Oral Daily Marykay Lex, MD   360 mg at 10/13/15 1741  . feeding supplement (ENSURE ENLIVE) (ENSURE ENLIVE) liquid 237 mL  237 mL Oral BID BM Dionne Ano Ward, RD   237 mL at 10/13/15 1152  . finasteride (PROSCAR) tablet 5 mg  5 mg Oral Daily Coralyn Helling, MD   5 mg at 10/13/15 1109  . fluticasone (FLONASE) 50 MCG/ACT nasal spray 2 spray  2 spray Each Nare Daily PRN Lorretta Harp, MD      . furosemide (LASIX) tablet 20 mg  20 mg Oral Daily Marykay Lex, MD   20 mg at 10/13/15 1108  . haloperidol lactate (HALDOL) injection 2 mg  2 mg Intravenous Q6H PRN Albertine Grates, MD   2 mg at 10/11/15 2202  . insulin aspart (novoLOG) injection 0-20 Units  0-20  Units Subcutaneous TID WC Coralyn Helling, MD   3 Units at 10/13/15 1743  . insulin aspart (novoLOG) injection 0-5 Units  0-5 Units Subcutaneous QHS Coralyn Helling, MD   3 Units at 10/10/15 2259  . insulin glargine (LANTUS) injection 20 Units  20 Units Subcutaneous BID Marinda Elk, MD   20 Units at 10/13/15 2239  . levalbuterol (XOPENEX) nebulizer solution 0.63 mg  0.63 mg Nebulization Q6H PRN Simonne Martinet, NP      . metoprolol (LOPRESSOR) injection 5 mg  5 mg Intravenous Q4H PRN Marinda Elk, MD   5 mg at 10/05/15 0006  . morphine 2 MG/ML injection 1 mg  1 mg Intravenous Q2H PRN Albertine Grates, MD   1 mg at 10/14/15 0117  . nitroGLYCERIN (NITROSTAT) SL tablet 0.4 mg  0.4 mg Sublingual Q5 min PRN Lorretta Harp, MD      . ondansetron [facility]) injection 4 mg  4 mg Intravenous Q6H PRN Lorretta Harp, MD   4 mg at 10/05/15 1957  . polyethylene glycol (MIRALAX / GLYCOLAX) packet 17 g  17 g  Oral Daily Albertine Grates, MD   17 g at 10/13/15 1110  . predniSONE (DELTASONE) tablet 50 mg  50 mg Oral Q breakfast Simonne Martinet, NP   50 mg at 10/14/15 0809  . rosuvastatin (CRESTOR) tablet 40 mg  40 mg Oral Daily Coralyn Helling, MD   40 mg at 10/13/15 1741  . senna-docusate (Senokot-S) tablet 1 tablet  1 tablet Oral BID Albertine Grates, MD   1 tablet at 10/13/15 2239  . sodium chloride 0.9 % injection 3 mL  3 mL Intravenous Q12H Lorretta Harp, MD   3 mL at 10/13/15 2239  . tamsulosin (FLOMAX) capsule 0.4 mg  0.4 mg Oral BID Coralyn Helling, MD   0.4 mg at 10/13/15 2239    PE: General appearance: alert, cooperative, no distress and using [place] Neck: no carotid bruit and no JVD Lungs: decreased breath sounds, no obvious wheezing Heart: irregularly irregular rhythm and tachy rate Extremities: no LEE Pulses: 2+ and symmetric Skin: warm and dry Neurologic: awake and alert- confused  Lab Results:   Recent Labs  10/12/15 0355  WBC 19.3*  HGB 15.2  HCT 48.2  PLT 154   BMET  Recent Labs  10/12/15 0355  NA 138  K 4.0  CL 94*  CO2 35*  GLUCOSE 99  BUN 35*  CREATININE 0.79  CALCIUM 8.5*   Echo: 10/09/15 Study Conclusions  - Left ventricle: The cavity size was moderately dilated. Systolic function was severely reduced. The estimated ejection fraction was in the range of 20% to 25%. Severe, diffuse hypokinesis. The study is not technically sufficient to allow evaluation of LV diastolic function. - Ventricular septum: Septal motion showed abnormal function and dyssynergy. - Mitral valve: Transvalvular velocity was within the normal range. There was no evidence for stenosis. There was moderate regurgitation. - Left atrium: The atrium was severely dilated. - Right ventricle: The cavity size was normal. Wall thickness was normal. Systolic function was normal. - Right atrium: The atrium was severely dilated. - Atrial septum: No defect or patent foramen ovale was identified by color  flow Doppler. - Tricuspid valve: There was mild regurgitation. - Pulmonary arteries: Systolic pressure was mildly increased. PA peak pressure: 36 mm Hg (S). - Inferior vena cava: The vessel was dilated. The respirophasic diameter changes were blunted (< 50%), consistent with elevated central venous pressure.  Assessment/Plan  Principal Problem:   Acute-on-chronic  respiratory failure (HCC) Active Problems:   Emphysema/COPD (HCC)   Pacemaker-St.Jude   CAD S/P previous PCI   Type 2 diabetes mellitus (HCC)   Permanent atrial fibrillation (HCC)   Depression   Chronic anticoagulation   Chronic combined systolic and diastolic congestive heart failure (HCC)   COPD with acute exacerbation (HCC)   GERD (gastroesophageal reflux disease)   BPH (benign prostatic hyperplasia)   Malnutrition of moderate degree   Atrial fibrillation with RVR (HCC)   Cardiomyopathy, ischemic   Palliative care encounter  PLAN:  1. Permanent atrial fib with RVR - intermittent NSVT - Driven by underlying respiratory disease. Rates remain difficult to control, trending in the low 100's to 120's despite max dose Diltiazem, Bisoprolol, and Lanoxin.  CHA2DS2VASc = 6 Eliquis. No significant improvement in rate after IV Amiodarone bolus on 1/26. Will give additional IV amiodarone today.  2. CAD with hx MI; neg troponin, no chest pain.  3. CHF (combined systolic and diastolic, chronic) - . Appears euvolemic -  4. COPD with acute exacerbation - seems to be declining.  5. Delirium  On Haldol prn.   7. Abnormal TSH- 0.250  8. Severe cardiomyopathy- EF 20-25% 10/09/15- (new drop in LVF c/w 2013)   9. Abdominal pain - he winced when I examined his abdomen, but I don't feel any masses or appreciate distention.  Appreciate palliative care evaluation - agree that symptom control and possible comfort approach may be our only remaining strategy if he continues to decline. I spoke with family, they still have hope he  will recover, but understand each time he is getting weaker. Mr. Melander reports being in pain and would benefit from palliative care pain management.  Chrystie Nose, MD, [facility] Attending Cardiologist [facility]  Chrystie Nose

## 2015-10-14 NOTE — Progress Notes (Signed)
Patient had 34 beats of v.tach, asymptomatic, BP stable. Daphane Shepherd NP was made aware. No new orders made. Will continue to monitor patient.

## 2015-10-15 LAB — GLUCOSE, CAPILLARY
Glucose-Capillary: 157 mg/dL — ABNORMAL HIGH (ref 65–99)
Glucose-Capillary: 107 mg/dL — ABNORMAL HIGH (ref 65–99)
Glucose-Capillary: 138 mg/dL — ABNORMAL HIGH (ref 65–99)
Glucose-Capillary: 48 mg/dL — ABNORMAL LOW (ref 65–99)
Glucose-Capillary: 79 mg/dL (ref 65–99)
Glucose-Capillary: 64 mg/dL — ABNORMAL LOW (ref 65–99)

## 2015-10-15 LAB — BASIC METABOLIC PANEL
ANION GAP: 8 (ref 5–15)
BUN: 41 mg/dL — AB (ref 6–20)
CALCIUM: 8 mg/dL — AB (ref 8.9–10.3)
CO2: 30 mmol/L (ref 22–32)
CREATININE: 0.75 mg/dL (ref 0.61–1.24)
Chloride: 94 mmol/L — ABNORMAL LOW (ref 101–111)
GFR calc Af Amer: >60 mL/min (ref >60)
GLUCOSE: 166 mg/dL — AB (ref 65–99)
Potassium: 4.5 mmol/L (ref 3.5–5.1)
Sodium: 132 mmol/L — ABNORMAL LOW (ref 135–145)

## 2015-10-15 MED ORDER — HALOPERIDOL LACTATE 5 MG/ML IJ SOLN: 1.0000 mg | Freq: Four times a day (QID) | INTRAMUSCULAR | Status: DC | PRN

## 2015-10-15 MED ORDER — SENNOSIDES-DOCUSATE SODIUM 8.6-50 MG PO TABS
2.0000 | ORAL_TABLET | Freq: Two times a day (BID) | ORAL | Status: DC
  Administered 2015-10-16 – 2015-10-17 (×3): 2 via ORAL
  Filled 2015-10-15 (×3): qty 2

## 2015-10-15 MED ORDER — BISACODYL 10 MG RE SUPP
10.0000 mg | Freq: Every day | RECTAL | Status: DC
  Administered 2015-10-15: 10 mg via RECTAL
  Filled 2015-10-15 (×2): qty 1

## 2015-10-15 MED ORDER — PREDNISONE 20 MG PO TABS
40.0000 mg | ORAL_TABLET | Freq: Every day | ORAL | Status: DC
  Administered 2015-10-16 – 2015-10-17 (×2): 40 mg via ORAL
  Filled 2015-10-15 (×2): qty 2

## 2015-10-15 MED ORDER — METOPROLOL SUCCINATE ER 100 MG PO TB24
200.0000 mg | ORAL_TABLET | Freq: Every day | ORAL | Status: DC
  Administered 2015-10-15 – 2015-10-17 (×3): 200 mg via ORAL
  Filled 2015-10-15 (×3): qty 2

## 2015-10-15 MED ORDER — FLUCONAZOLE IN SODIUM CHLORIDE 200-0.9 MG/100ML-% IV SOLN
200.0000 mg | Freq: Once | INTRAVENOUS | Status: AC
Start: 2015-10-15 — End: 2015-10-15
  Administered 2015-10-15: 200 mg via INTRAVENOUS
  Filled 2015-10-15: qty 100

## 2015-10-15 MED ORDER — NYSTATIN 100000 UNIT/ML MT SUSP
5.0000 mL | Freq: Four times a day (QID) | OROMUCOSAL | Status: DC
  Administered 2015-10-15 – 2015-10-16 (×7): 500000 [IU] via ORAL
  Filled 2015-10-15 (×8): qty 5

## 2015-10-15 NOTE — Progress Notes (Signed)
Pt only received half of his breathing medication. Pt ask rt to remove.

## 2015-10-15 NOTE — Progress Notes (Signed)
Patient Profile: 82 year old male with hx of severe COPD on chronic O2,  CAF-on Eliquis,  CAD (STEMI in 2011 and 2012 - PCI with DES to LCX, chronic total occl of RCA with Lt->rt collaterals, nonobstructive LAD stenosis) Hx of ICM -CHF with EF 25% (echo 10/09/15), St Jude PTVDP, NIDDM, HLD, and BPH.  Admitted 09/30/15 for COPD exacerbation, found to be in atrial fibrillation w/ RVR.  Subjective: HR remains in the 120's, despite additional amiodarone yesterday. BP a little higher today in the 110-120 range.  Objective: Vital signs in last 24 hours: Temp:  [97.5 F (36.4 C)-99.5 F (37.5 C)] 97.8 F (36.6 C) (01/29 0700) Pulse Rate:  [52-96] 53 (01/29 0700) Resp:  [16-18] 16 (01/29 0700) BP: (104-124)/(67-76) 116/67 mmHg (01/29 0700) SpO2:  [90 %-96 %] 94 % (01/29 0700) Weight:  [172 lb 9.6 oz (78.291 kg)] 172 lb 9.6 oz (78.291 kg) (01/29 0700) Last BM Date: 10/12/15  Intake/Output from previous day: 01/28 0701 - 01/29 0700 In: 100 [I.V.:100] Out: 1075 [Urine:1075] Intake/Output this shift:    Medications Current Facility-Administered Medications  Medication Dose Route Frequency Provider Last Rate Last Dose  . acetaminophen (TYLENOL) tablet 650 mg  650 mg Oral Q6H PRN Lorretta Harp, MD       Or  . acetaminophen (TYLENOL) suppository 650 mg  650 mg Rectal Q6H PRN Lorretta Harp, MD      . antiseptic oral rinse (CPC / CETYLPYRIDINIUM CHLORIDE 0.05%) solution 7 mL  7 mL Mouth Rinse q12n4p Hollice Espy, MD   7 mL at 10/14/15 1700  . apixaban (ELIQUIS) tablet 5 mg  5 mg Oral BID Coralyn Helling, MD   5 mg at 10/14/15 2046  . arformoterol (BROVANA) nebulizer solution 15 mcg  15 mcg Nebulization BID Simonne Martinet, NP   15 mcg at 10/14/15 2020  . bisoprolol (ZEBETA) tablet 20 mg  20 mg Oral Daily Marykay Lex, MD   20 mg at 10/14/15 1191  . budesonide (PULMICORT) nebulizer solution 0.5 mg  0.5 mg Nebulization BID Coralyn Helling, MD   0.5 mg at 10/14/15 2020  . chlorhexidine (PERIDEX) 0.12  % solution 15 mL  15 mL Mouth Rinse BID Hollice Espy, MD   15 mL at 10/14/15 0927  . dextromethorphan-guaiFENesin (MUCINEX DM) 30-600 MG per 12 hr tablet 1 tablet  1 tablet Oral BID Calvert Cantor, MD   1 tablet at 10/14/15 2046  . digoxin (LANOXIN) tablet 0.125 mg  0.125 mg Oral Daily Coralyn Helling, MD   0.125 mg at 10/14/15 0928  . diltiazem (CARDIZEM CD) 24 hr capsule 360 mg  360 mg Oral Daily Marykay Lex, MD   360 mg at 10/14/15 1707  . feeding supplement (ENSURE ENLIVE) (ENSURE ENLIVE) liquid 237 mL  237 mL Oral BID BM Dionne Ano Ward, RD   237 mL at 10/13/15 1152  . finasteride (PROSCAR) tablet 5 mg  5 mg Oral Daily Coralyn Helling, MD   5 mg at 10/14/15 0928  . fluticasone (FLONASE) 50 MCG/ACT nasal spray 2 spray  2 spray Each Nare Daily PRN Lorretta Harp, MD      . furosemide (LASIX) tablet 20 mg  20 mg Oral Daily Marykay Lex, MD   20 mg at 10/14/15 4782  . haloperidol lactate (HALDOL) injection 2 mg  2 mg Intravenous Q6H PRN Albertine Grates, MD   2 mg at 10/11/15 2202  . insulin aspart (novoLOG) injection 0-20 Units  0-20 Units  Subcutaneous TID WC Coralyn Helling, MD   4 Units at 10/14/15 1155  . insulin aspart (novoLOG) injection 0-5 Units  0-5 Units Subcutaneous QHS Coralyn Helling, MD   3 Units at 10/10/15 2259  . insulin glargine (LANTUS) injection 20 Units  20 Units Subcutaneous BID Marinda Elk, MD   20 Units at 10/14/15 2344  . levalbuterol (XOPENEX) nebulizer solution 0.63 mg  0.63 mg Nebulization Q6H PRN Simonne Martinet, NP      . metoprolol (LOPRESSOR) injection 5 mg  5 mg Intravenous Q4H PRN Marinda Elk, MD   5 mg at 10/05/15 0006  . morphine 2 MG/ML injection 1 mg  1 mg Intravenous Q2H PRN Albertine Grates, MD   1 mg at 10/15/15 0347  . nitroGLYCERIN (NITROSTAT) SL tablet 0.4 mg  0.4 mg Sublingual Q5 min PRN Lorretta Harp, MD      . ondansetron [facility]) injection 4 mg  4 mg Intravenous Q6H PRN Lorretta Harp, MD   4 mg at 10/05/15 1957  . polyethylene glycol (MIRALAX / GLYCOLAX) packet 17 g  17 g  Oral Daily Albertine Grates, MD   17 g at 10/13/15 1110  . predniSONE (DELTASONE) tablet 50 mg  50 mg Oral Q breakfast Simonne Martinet, NP   50 mg at 10/14/15 0809  . rosuvastatin (CRESTOR) tablet 40 mg  40 mg Oral Daily Coralyn Helling, MD   40 mg at 10/14/15 1707  . senna-docusate (Senokot-S) tablet 1 tablet  1 tablet Oral BID Albertine Grates, MD   1 tablet at 10/14/15 2046  . sodium chloride 0.9 % injection 3 mL  3 mL Intravenous Q12H Lorretta Harp, MD   3 mL at 10/14/15 2047  . tamsulosin (FLOMAX) capsule 0.4 mg  0.4 mg Oral BID Coralyn Helling, MD   0.4 mg at 10/14/15 2046    PE: General appearance: alert, cooperative, no distress and using [place] Neck: no carotid bruit and no JVD Lungs: decreased breath sounds, no obvious wheezing Heart: irregularly irregular rhythm and tachy rate Extremities: no LEE Pulses: 2+ and symmetric Skin: warm and dry Neurologic: awake and alert- confused  Lab Results:  No results for input(s): WBC, HGB, HCT, PLT in the last 72 hours. BMET  Recent Labs  10/15/15 0515  NA 132*  K 4.5  CL 94*  CO2 30  GLUCOSE 166*  BUN 41*  CREATININE 0.75  CALCIUM 8.0*   Echo: 10/09/15 Study Conclusions  - Left ventricle: The cavity size was moderately dilated. Systolic function was severely reduced. The estimated ejection fraction was in the range of 20% to 25%. Severe, diffuse hypokinesis. The study is not technically sufficient to allow evaluation of LV diastolic function. - Ventricular septum: Septal motion showed abnormal function and dyssynergy. - Mitral valve: Transvalvular velocity was within the normal range. There was no evidence for stenosis. There was moderate regurgitation. - Left atrium: The atrium was severely dilated. - Right ventricle: The cavity size was normal. Wall thickness was normal. Systolic function was normal. - Right atrium: The atrium was severely dilated. - Atrial septum: No defect or patent foramen ovale was identified by color flow  Doppler. - Tricuspid valve: There was mild regurgitation. - Pulmonary arteries: Systolic pressure was mildly increased. PA peak pressure: 36 mm Hg (S). - Inferior vena cava: The vessel was dilated. The respirophasic diameter changes were blunted (< 50%), consistent with elevated central venous pressure.  Assessment/Plan  Principal Problem:   Acute-on-chronic respiratory failure (HCC) Active Problems:   Emphysema/COPD (  HCC)   Pacemaker-St.Jude   CAD S/P previous PCI   Type 2 diabetes mellitus (HCC)   Permanent atrial fibrillation (HCC)   Depression   Chronic anticoagulation   Chronic combined systolic and diastolic congestive heart failure (HCC)   COPD with acute exacerbation (HCC)   GERD (gastroesophageal reflux disease)   BPH (benign prostatic hyperplasia)   Malnutrition of moderate degree   Atrial fibrillation with RVR (HCC)   Cardiomyopathy, ischemic   Palliative care encounter  PLAN:  1. Permanent atrial fib with RVR - intermittent NSVT - Driven by underlying respiratory disease. Rates remain difficult to control, trending in the low 100's to 120's despite max dose Diltiazem, Bisoprolol, and Lanoxin.  CHA2DS2VASc = 6 Eliquis. No significant improvement in rate after IV Amiodarone bolus on 1/26. Rate still not controlled with amiodarone. Max dose CCB, BBB and digoxin.   2. CAD with hx MI; neg troponin, no chest pain.  3. CHF (combined systolic and diastolic, chronic) - . Appears euvolemic -  4. COPD with acute exacerbation - seems to be declining.  5. Delirium  On Haldol prn.   7. Abnormal TSH- 0.250  8. Severe cardiomyopathy- EF 20-25% 10/09/15- (new drop in LVF c/w 2013)   9. Abdominal pain - he winced when I examined his abdomen, but I don't feel any masses or appreciate distention.  Rate control continues to be challenging .Marland Kitchen He is on max dose of medications.  Will change bisoprolol 20 mg to Toprol XL 200 mg daily.  Chrystie Nose, MD, [facility] Attending  Cardiologist [facility]  Chrystie Nose

## 2015-10-15 NOTE — Progress Notes (Addendum)
[facility] PROGRESS NOTE    Progress Note   [name] JSE:[id number] DOB: August 18, 1928 DOA: 09/30/2015 PCP: Martha Clan, MD   Brief Narrative:   [name] is an 82 y.o. male With past medical history of advanced COPD on home oxygen, diabetes mellitus, CAD atrial fibrillation on apixiban chronic systolic and diastolic heart failure comes to the ED complaining of cough and shortness of breath. She was found to be in acute COPD exacerbation placed on BiPAP and admitted to the hospital. She was started on IV Solu-Medrol steroids and PCCM was consulted. Patient  Cannot tolerate BiPAP when necessary.  Assessment/Plan:   Acute-on-chronic respiratory failure (HCC) Due to acute COPD exacerbation/acute metabolic encephalopathy: PCCM was consulted and recommended to continue BiPAP and IV morphine.   he is on chronic prednisone at home, currently on  IV steroids for an additional 24 hours a repeat a chest x-ray does not show any acute findings. As per critical care he is similar as he was a few years ago when they discuss hospice but he seems to bounce back. Dr Robb Matar d/w the patient again he would like to cont aggressive treatment.  not making progress,  pulm recommended hospice care. Palliative care consulted  Atrial Fibrillation with RVR CHA2DS2-VASc Score is 6. Patient is on Eliquis. Heart rate is hard to control continues to be in mild RVR Cardiology on board, appreciate assistance, heart rate continues to improve. Continues to be in RVR despite increasing diltiazem. Appreciate cardiology's assistance. Continue adjust med, heart rate difficult to control.  Chronic, systolic and diastolic heart failure: Seems to be euvolemic, continue current regimen. The echo was done that shows an ejection fraction of 25%  CAD: Asymptomatic continue medical management.   Controlled diabetes mellitus type 2: A1c was 6.7. Titrate Lantus, his blood glucose continues to be high likely  due to steroids. Taper steroids  Acute confusional state: has required prn haldol and morphine, daughter concerned that patient might be too sedated from iv morphine,  iv morphine decreased to 1mg  prn, change haldol to prn dose Resolved, continue Haldol when necessary.  Has not need haldol in the last two days  BPH: Continue Flomax and Proscar  Constipation: started stool regimen, declined enema, states will waiting for a while before thinking about enema  Malnutrition of moderate degree, nutrition supplement  Oral thrush: agreed to topical nystatin, add diflucan    DVT Prophylaxis - on apixaban  Family Communication: patient  Disposition Plan: Pulmonary recommended palliative care and signed off Code Status:     Code Status Orders        Start     Ordered   09/30/15 0534  Do not attempt resuscitation (DNR)   Continuous    Question Answer Comment  In the event of cardiac or respiratory ARREST Do not call a "code blue"   In the event of cardiac or respiratory ARREST Do not perform Intubation, CPR, defibrillation or ACLS   In the event of cardiac or respiratory ARREST Use medication by any route, position, wound care, and other measures to relive pain and suffering. May use oxygen, suction and manual treatment of airway obstruction as needed for comfort.      09/30/15 0534    Code Status History    Date Active Date Inactive Code Status Order ID Comments User Context   03/27/2014 10:53 PM 03/29/2014  2:51 PM DNR [id number]  Alysia Penna, MD Inpatient   12/31/2013  5:02 PM 01/06/2014  6:19 PM DNR [id number]  Marrian Salvage, MD ED   09/09/2013  4:03 PM 09/17/2013  2:54 PM DNR [id number]  Hoyle Sauer, MD Inpatient   07/09/2013  7:51 PM 07/13/2013  1:34 PM DNR [id number]  Jarome Matin, MD Inpatient   07/09/2013  1:18 PM 07/09/2013  7:51 PM Full Code [id number]  Jarome Matin, MD ED   09/28/2012  7:29 AM 10/01/2012  6:12 PM DNR [id number]  Kari Baars, MD Inpatient   04/04/2012   1:02 PM 04/14/2012  2:29 PM DNR [id number]  Jarome Matin, MD ED   04/04/2012 12:57 PM 04/04/2012  1:02 PM DNR [id number]  Jarome Matin, MD ED   03/10/2012  5:58 PM 03/19/2012  4:40 PM DNR [id number]  Tammy Sours, RN Inpatient   11/13/2011 11:11 AM 11/15/2011  8:15 PM DNR [id number]  Lorinda Creed, NP Inpatient   11/08/2011  3:03 PM 11/13/2011 11:10 AM Partial Code [id number]  Kalman Shan, MD Inpatient   11/07/2011  4:13 PM 11/08/2011  3:03 PM Full Code [id number]  Theora Gianotti, RN Inpatient    Advance Directive Documentation        Most Recent Value   Type of Advance Directive  Healthcare Power of Attorney, Living will   Pre-existing out of facility DNR order (yellow form or pink MOST form)     "MOST" Form in Place?          IV Access:    Peripheral IV   Procedures and diagnostic studies:   No results found.   Medical Consultants:    None.  Anti-Infectives:   Anti-infectives    Start     Dose/Rate Route Frequency Ordered Stop   10/02/15 1000  cefTRIAXone (ROCEPHIN) 1 g in dextrose 5 % 50 mL IVPB  Status:  Discontinued     1 g 100 mL/hr over 30 Minutes Intravenous Every 24 hours 10/02/15 0912 10/09/15 1301   10/01/15 1000  azithromycin (ZITHROMAX) tablet 250 mg  Status:  Discontinued     250 mg Oral Daily 09/30/15 0530 10/02/15 0920   09/30/15 1000  azithromycin (ZITHROMAX) tablet 500 mg     500 mg Oral Daily 09/30/15 0530 09/30/15 0944      Subjective:    [name]  Very frail,  Seems less agitated this am, upper airway gurgling seems has resolved in the last 2 days, patient does not want to be bothered, does not want to talk too much,  but did agree me to exam him briefly.   Objective:    Filed Vitals:   10/14/15 2128 10/15/15 0700 10/15/15 0910 10/15/15 0959  BP: 124/76 116/67    Pulse: 52 53  130  Temp: 97.5 F (36.4 C) 97.8 F (36.6 C)    TempSrc: Axillary Axillary    Resp: 16 16    Height:      Weight:  78.291 kg (172 lb 9.6 oz)    SpO2: 94% 94%  92%     Intake/Output Summary (Last 24 hours) at 10/15/15 1141 Last data filed at 10/15/15 1006  Gross per 24 hour  Intake    340 ml  Output   1075 ml  Net   -735 ml   Filed Weights   10/13/15 0638 10/14/15 0500 10/15/15 0700  Weight: 78.155 kg (172 lb 4.8 oz) 77.1 kg (169 lb 15.6 oz) 78.291 kg (172 lb 9.6 oz)    Exam: Gen:  Very frail, no gurgling heard today, lethargic with intermittent agitation Cardiovascular:  IRRR. Chest and lungs:  Overall diminished, no wheezing today Abdomen:  Abdomen soft, NT/ND, + BS Extremities:  No edema. Skin: diffuse erythema upper extremity, worse on the left.   Data Reviewed:    Labs: Basic Metabolic Panel:  Recent Labs Lab 10/10/15 0426 10/11/15 0345 10/12/15 0355 10/13/15 0506 10/15/15 0515  NA  --   --  138  --  132*  K  --   --  4.0  --  4.5  CL  --   --  94*  --  94*  CO2  --   --  35*  --  30  GLUCOSE  --   --  99  --  166*  BUN  --   --  35*  --  41*  CREATININE  --   --  0.79  --  0.75  CALCIUM  --   --  8.5*  --  8.0*  MG 2.3 2.2 2.2 2.3  --   PHOS 2.5 2.7 3.0 3.3  --    GFR Estimated Creatinine Clearance: 67.5 mL/min (by C-G formula based on Cr of 0.75). Liver Function Tests: No results for input(s): AST, ALT, ALKPHOS, BILITOT, PROT, ALBUMIN in the last 168 hours. No results for input(s): LIPASE, AMYLASE in the last 168 hours. No results for input(s): AMMONIA in the last 168 hours. Coagulation profile No results for input(s): INR, PROTIME in the last 168 hours.  CBC:  Recent Labs Lab 10/12/15 0355  WBC 19.3*  HGB 15.2  HCT 48.2  MCV 95.3  PLT 154   Cardiac Enzymes: No results for input(s): CKTOTAL, CKMB, CKMBINDEX, TROPONINI in the last 168 hours. BNP (last 3 results) No results for input(s): PROBNP in the last 8760 hours. CBG:  Recent Labs Lab 10/14/15 0735 10/14/15 1147 10/14/15 1646 10/14/15 2126 10/15/15 0736  GLUCAP 78 151* 90 157* 107*   D-Dimer: No results for input(s): DDIMER in  the last 72 hours. Hgb A1c: No results for input(s): HGBA1C in the last 72 hours. Lipid Profile: No results for input(s): CHOL, HDL, LDLCALC, TRIG, CHOLHDL, LDLDIRECT in the last 72 hours. Thyroid function studies:  Recent Labs  10/13/15 0938  TSH 0.541   Anemia work up: No results for input(s): VITAMINB12, FOLATE, FERRITIN, TIBC, IRON, RETICCTPCT in the last 72 hours. Sepsis Labs:  Recent Labs Lab 10/12/15 0355  WBC 19.3*   Microbiology No results found for this or any previous visit (from the past 240 hour(s)).   Medications:   . antiseptic oral rinse  7 mL Mouth Rinse q12n4p  . apixaban  5 mg Oral BID  . arformoterol  15 mcg Nebulization BID  . bisacodyl  10 mg Rectal Daily  . budesonide (PULMICORT) nebulizer solution  0.5 mg Nebulization BID  . chlorhexidine  15 mL Mouth Rinse BID  . dextromethorphan-guaiFENesin  1 tablet Oral BID  . digoxin  0.125 mg Oral Daily  . diltiazem  360 mg Oral Daily  . feeding supplement (ENSURE ENLIVE)  237 mL Oral BID BM  . finasteride  5 mg Oral Daily  . furosemide  20 mg Oral Daily  . insulin aspart  0-20 Units Subcutaneous TID WC  . insulin aspart  0-5 Units Subcutaneous QHS  . insulin glargine  20 Units Subcutaneous BID  . metoprolol succinate  200 mg Oral Daily  . polyethylene glycol  17 g Oral Daily  . predniSONE  50 mg Oral Q breakfast  . rosuvastatin  40 mg Oral Daily  . senna-docusate  2 tablet Oral  BID  . sodium chloride  3 mL Intravenous Q12H  . tamsulosin  0.4 mg Oral BID   Continuous Infusions:   Time spent: 25 min   LOS: 15 days   Melizza Kanode MD PhD  [facility] Pager [phone]  *Please refer to [email or web address], password [email or web address] to get updated schedule on who will round on this patient, as hospitalists switch teams weekly. If 7PM-7AM, please contact night-coverage at [email or web address], password [email or web address] for any overnight needs.  10/15/2015, 11:41 AM

## 2015-10-16 DIAGNOSIS — R0600 Dyspnea, unspecified: Secondary | ICD-10-CM

## 2015-10-16 LAB — BASIC METABOLIC PANEL
Anion gap: 9 (ref 5–15)
BUN: 40 mg/dL — AB (ref 6–20)
CALCIUM: 8.2 mg/dL — AB (ref 8.9–10.3)
CO2: 29 mmol/L (ref 22–32)
CREATININE: 0.81 mg/dL (ref 0.61–1.24)
Chloride: 96 mmol/L — ABNORMAL LOW (ref 101–111)
GFR calc Af Amer: >60 mL/min (ref >60)
GLUCOSE: 86 mg/dL (ref 65–99)
POTASSIUM: 4.1 mmol/L (ref 3.5–5.1)
SODIUM: 134 mmol/L — AB (ref 135–145)

## 2015-10-16 LAB — GLUCOSE, CAPILLARY
GLUCOSE-CAPILLARY: 96 mg/dL (ref 65–99)
GLUCOSE-CAPILLARY: 223 mg/dL — AB (ref 65–99)
GLUCOSE-CAPILLARY: 255 mg/dL — AB (ref 65–99)
Glucose-Capillary: 51 mg/dL — ABNORMAL LOW (ref 65–99)
Glucose-Capillary: 75 mg/dL (ref 65–99)

## 2015-10-16 LAB — HEPATIC FUNCTION PANEL
ALT: 31 U/L (ref 17–63)
AST: 34 U/L (ref 15–41)
Albumin: 2.5 g/dL — ABNORMAL LOW (ref 3.5–5.0)
Alkaline Phosphatase: 103 U/L (ref 38–126)
BILIRUBIN DIRECT: 0.4 mg/dL (ref 0.1–0.5)
BILIRUBIN INDIRECT: 0.7 mg/dL (ref 0.3–0.9)
BILIRUBIN TOTAL: 1.1 mg/dL (ref 0.3–1.2)
Total Protein: 4.7 g/dL — ABNORMAL LOW (ref 6.5–8.1)

## 2015-10-16 LAB — CBC
HEMATOCRIT: 48.9 % (ref 39.0–52.0)
Hemoglobin: 15.9 g/dL (ref 13.0–17.0)
MCH: 30.2 pg (ref 26.0–34.0)
MCHC: 32.5 g/dL (ref 30.0–36.0)
MCV: 93 fL (ref 78.0–100.0)
PLATELETS: 120 10*3/uL — AB (ref 150–400)
RBC: 5.26 MIL/uL (ref 4.22–5.81)
RDW: 13.4 % (ref 11.5–15.5)
WBC: 23.4 10*3/uL — AB (ref 4.0–10.5)

## 2015-10-16 LAB — TROPONIN I
TROPONIN I: 0.22 ng/mL — AB (ref <0.031)
Troponin I: 0.26 ng/mL — ABNORMAL HIGH (ref <0.031)

## 2015-10-16 LAB — PROTIME-INR
INR: 1.84 — AB (ref 0.00–1.49)
PROTHROMBIN TIME: 20.6 s — AB (ref 11.6–15.2)

## 2015-10-16 MED ORDER — LIDOCAINE 5 % EX PTCH
1.0000 | MEDICATED_PATCH | Freq: Once | CUTANEOUS | Status: AC
  Administered 2015-10-16: 1 via TRANSDERMAL
  Filled 2015-10-16: qty 1

## 2015-10-16 MED ORDER — MEGESTROL ACETATE 400 MG/10ML PO SUSP
400.0000 mg | Freq: Every day | ORAL | Status: DC
  Administered 2015-10-16: 400 mg via ORAL
  Filled 2015-10-16 (×2): qty 10

## 2015-10-16 MED ORDER — DULOXETINE HCL 30 MG PO CPEP
30.0000 mg | ORAL_CAPSULE | Freq: Every day | ORAL | Status: DC
  Administered 2015-10-16 – 2015-10-17 (×2): 30 mg via ORAL
  Filled 2015-10-16 (×2): qty 1

## 2015-10-16 MED ORDER — LIDOCAINE 5 % EX PTCH
1.0000 | MEDICATED_PATCH | CUTANEOUS | Status: DC
  Administered 2015-10-17: 1 via TRANSDERMAL
  Filled 2015-10-16 (×3): qty 1

## 2015-10-16 MED ORDER — FUROSEMIDE 10 MG/ML IJ SOLN
40.0000 mg | Freq: Once | INTRAMUSCULAR | Status: AC
Start: 2015-10-16 — End: 2015-10-16
  Administered 2015-10-16: 40 mg via INTRAVENOUS
  Filled 2015-10-16: qty 4

## 2015-10-16 MED ORDER — MORPHINE SULFATE (CONCENTRATE) 10 MG/0.5ML PO SOLN
2.5000 mg | ORAL | Status: DC | PRN
  Administered 2015-10-17 (×3): 2.6 mg via ORAL
  Filled 2015-10-16 (×3): qty 0.5

## 2015-10-16 NOTE — Progress Notes (Signed)
PCCM PROGRESS NOTE  ADMISSION DATE: 09/30/2015 CONSULT DATE: 09/30/2015 REFERRING PROVIDER: Dr. Rito Ehrlich  CC: Short of breath  CULTURES: 1/14 Influenza >> negative 1/14 Pneumococcal Ag >> Positive 1/15 Sputum >> oral flora  ANTIBIOTICS: 1/14 Zithromax >> 1/16 1/16 Rocephin >> 1/23  STUDIES: Echo 1/23 >> LVEF 20-25%, severe diffuse hypokinesis. PAP 36 CXR 1/24 >> COPD, no acute changes  EVENTS: 1/14 Admit 1/16 Obtunded, stuck on BiPAP 1/17 Mental status improved 1/18 Back on BiPAP; cardiology consulted for a fib 1/20 - Didn't use BiPAP last night.  Still feels weak, but breathing improved.  SUBJECTIVE/OVERNIGHT/INTERVAL HX  "no better"   VITAL SIGNS: BP 107/58 mmHg  Pulse 86  Temp(Src) 97.8 F (36.6 C) (Axillary)  Resp 16  Ht  (1.753 m)  Wt 168 lb 6.9 oz (76.4 kg)  BMI 24.86 kg/m2  SpO2 94%  INTAKE/OUTPUT: I/O last 3 completed shifts: In: 1080 [P.O.:1080] Out: 2175 [Urine:2175]  General: Deconditioned very weak, awake, alert. Frustrated as he's not feeling better HEENT: no sinus tenderness Cardiac: irregular, no MRG Chest: no accessory muscle use, decreased t/o .Abd: Soft, non tender Ext: 1+ edema Neuro: Follows commands. No focal deficits Skin: No rashes  PULMONARY  Recent Labs Lab 10/09/15 1500  PHART 7.401  PCO2ART 57.4*  PO2ART 101*  HCO3 34.9*  TCO2 29.7  O2SAT 97.3    CBC  Recent Labs Lab 10/12/15 0355 10/16/15 0550  HGB 15.2 15.9  HCT 48.2 48.9  WBC 19.3* 23.4*  PLT 154 120*    COAGULATION  Recent Labs Lab 10/16/15 0550  INR 1.84*    CARDIAC    Recent Labs Lab 10/16/15 1017  TROPONINI 0.26*   No results for input(s): PROBNP in the last 168 hours.   CHEMISTRY  Recent Labs Lab 10/10/15 0426 10/11/15 0345  10/12/15 0355 10/13/15 0506 10/15/15 0515 10/16/15 0550  NA  --   --   --  138  --  132* 134*  K  --   --   < > 4.0  --  4.5 4.1  CL  --   --   --  94*  --  94* 96*  CO2  --   --   --  35*  --   30 29  GLUCOSE  --   --   --  99  --  166* 86  BUN  --   --   --  35*  --  41* 40*  CREATININE  --   --   --  0.79  --  0.75 0.81  CALCIUM  --   --   --  8.5*  --  8.0* 8.2*  MG 2.3 2.2  --  2.2 2.3  --   --   PHOS 2.5 2.7  --  3.0 3.3  --   --   < > = values in this interval not displayed. Estimated Creatinine Clearance: 66.7 mL/min (by C-G formula based on Cr of 0.81).  ENDOCRINE CBG (last 3)   Recent Labs  10/15/15 2114 10/16/15 0740 10/16/15 0807  GLUCAP 64* 51* 75     IMAGING x48h  - image(s) personally visualized  -   highlighted in bold No results found.   DISCUSSION: 82 yo male former smoker with cough, dyspnea, wheeze, hypoxia from AECOPD and tracheobronchitis. Not improving much. Suspect that this is mostly d/t his deconditioning in the setting of severe underlying lung disease. Only interventions are supportive at this point. Cont BDs, slow taper steroids and pulm  hygiene. Not sure he will recover from this. Seems resigned to this belief. Consider treatment of depression. Either way palliative goal should be focus. We will be available as needed.   He has hx of advanced COPD on chronic prednisone, chronic systolic/diastolic CHF with EF 20-25% from Jan 17, Chronic a fib on eliquis, CAD, HTN, HLD, s/p PM, GERD, DM. He is DNR/DNI.  ASSESSMENT/PLAN:  Acute hypoxic/hypercapnic respiratory failure. Plan: - oxygen to keep SpO2 88 to 95% - prn morphine for dyspnea -Comfort main be main goal here.  AECOPD. Tracheobronchitis. Plan: - pulmicort, xopenex, ipratropium - continue slow pred taper,  flutter valve and incentive spirometer - rocephin completed   Chronic combined CHF. EF 20-25% Hx of A fib, CAD, HTN, HLD. Plan: - per primary team and cardiology   Goals of Care >> DNR/DNI. He seems to have given up. From a direct pulmonary stand-point there is nothing more for [place] to offer. Think best approach here is palliative focus. Also consider treatment for  depression. We will s/o call PRN.   Simonne Martinet ACNP-BC [facility] Pulmonary/Critical Care Pager # [phone] OR # [phone] if no answer   10/16/2015, 12:37 PM

## 2015-10-16 NOTE — Progress Notes (Addendum)
Patient Profile: 82 year old male with hx of severe COPD on chronic O2, CAF-on Eliquis, CAD (STEMI in 2011 and 2012 - PCI with DES to LCX, chronic total occl of RCA with Lt->rt collaterals, nonobstructive LAD stenosis) Hx of ICM -CHF with EF 25% (echo 10/09/15), St Jude PTVDP, NIDDM, HLD, and BPH. Admitted 09/30/15 for COPD exacerbation, found to be in atrial fibrillation w/ RVR. Lungs have improved pt now on tele.  HR improved this am on amiodarone.     Subjective: "I am hungry have not eaten all weak"  Complains of constant chest pain  Objective: Vital signs in last 24 hours: Temp:  [97.6 F (36.4 C)-97.8 F (36.6 C)] 97.8 F (36.6 C) (01/30 0555) Pulse Rate:  [51-130] 86 (01/30 0647) Resp:  [16-20] 16 (01/30 0555) BP: (80-118)/(41-69) 107/58 mmHg (01/30 0647) SpO2:  [92 %-98 %] 94 % (01/30 0820) Weight:  [168 lb 6.9 oz (76.4 kg)] 168 lb 6.9 oz (76.4 kg) (01/30 0555) Weight change: -4 lb 2.7 oz (-1.891 kg) Last BM Date: 10/12/15 Intake/Output from previous day: 01/29 0701 - 01/30 0700 In: 1080 [P.O.:1080] Out: 1100 [Urine:1100] Intake/Output this shift:    PE: General:Pleasant affect, NAD Skin:Warm and dry, brisk capillary refill HEENT:normocephalic, sclera clear, mucus membranes moist Heart:irreg irreg without murmur, gallup, rub or click Lungs:diminished to clear post but ant with coarse rhonchi and occ wheeze no rales ZOX:WRUE, non tender, + BS, do not palpate liver spleen or masses Ext:no lower ext edema, 2+ pedal pulses, 2+ radial pulses Neuro:alert and oriented except month., MAE, follows commands, + facial symmetry Tele: a fib with occ pacing, rate overall controlled 80-90s. occ 105.   Lab Results:  Recent Labs  10/16/15 0550  WBC 23.4*  HGB 15.9  HCT 48.9  PLT 120*   BMET  Recent Labs  10/15/15 0515 10/16/15 0550  NA 132* 134*  K 4.5 4.1  CL 94* 96*  CO2 30 29  GLUCOSE 166* 86  BUN 41* 40*  CREATININE 0.75 0.81  CALCIUM 8.0* 8.2*    No results for input(s): TROPONINI in the last 72 hours.  Invalid input(s): CK, MB  Lab Results  Component Value Date   CHOL 135 03/11/2012   HDL 32* 03/11/2012   LDLCALC 66 03/11/2012   TRIG 187* 03/11/2012   CHOLHDL 4.2 03/11/2012   Lab Results  Component Value Date   HGBA1C 6.7* 09/30/2015     Lab Results  Component Value Date   TSH 0.541 10/13/2015    Hepatic Function Panel  Recent Labs  10/16/15 0550  PROT 4.7*  ALBUMIN 2.5*  AST 34  ALT 31  ALKPHOS 103  BILITOT 1.1  BILIDIR 0.4  IBILI 0.7   No results for input(s): CHOL in the last 72 hours. No results for input(s): PROTIME in the last 72 hours.     Studies/Results: Echo: 10/09/15 Study Conclusions  - Left ventricle: The cavity size was moderately dilated. Systolic function was severely reduced. The estimated ejection fraction was in the range of 20% to 25%. Severe, diffuse hypokinesis. The study is not technically sufficient to allow evaluation of LV diastolic function. - Ventricular septum: Septal motion showed abnormal function and dyssynergy. - Mitral valve: Transvalvular velocity was within the normal range. There was no evidence for stenosis. There was moderate regurgitation. - Left atrium: The atrium was severely dilated. - Right ventricle: The cavity size was normal. Wall thickness was normal. Systolic function was normal. - Right atrium: The  atrium was severely dilated. - Atrial septum: No defect or patent foramen ovale was identified by color flow Doppler. - Tricuspid valve: There was mild regurgitation. - Pulmonary arteries: Systolic pressure was mildly increased. PA peak pressure: 36 mm Hg (S). - Inferior vena cava: The vessel was dilated. The respirophasic diameter changes were blunted (< 50%), consistent with elevated central venous pressure.  Medications: I have reviewed the patient's current medications. Scheduled Meds: . antiseptic oral rinse  7 mL  Mouth Rinse q12n4p  . apixaban  5 mg Oral BID  . arformoterol  15 mcg Nebulization BID  . bisacodyl  10 mg Rectal Daily  . budesonide (PULMICORT) nebulizer solution  0.5 mg Nebulization BID  . chlorhexidine  15 mL Mouth Rinse BID  . dextromethorphan-guaiFENesin  1 tablet Oral BID  . digoxin  0.125 mg Oral Daily  . diltiazem  360 mg Oral Daily  . feeding supplement (ENSURE ENLIVE)  237 mL Oral BID BM  . finasteride  5 mg Oral Daily  . furosemide  20 mg Oral Daily  . insulin aspart  0-20 Units Subcutaneous TID WC  . insulin aspart  0-5 Units Subcutaneous QHS  . insulin glargine  20 Units Subcutaneous BID  . metoprolol succinate  200 mg Oral Daily  . nystatin  5 mL Oral QID  . polyethylene glycol  17 g Oral Daily  . predniSONE  40 mg Oral Q breakfast  . rosuvastatin  40 mg Oral Daily  . senna-docusate  2 tablet Oral BID  . sodium chloride  3 mL Intravenous Q12H  . tamsulosin  0.4 mg Oral BID   Continuous Infusions:  PRN Meds:.acetaminophen **OR** acetaminophen, fluticasone, haloperidol lactate, levalbuterol, metoprolol, morphine injection, nitroGLYCERIN, [DISCONTINUED] ondansetron **OR** ondansetron (ZOFRAN) IV  Assessment/Plan: Principal Problem:   Acute-on-chronic respiratory failure (HCC) Active Problems:   Emphysema/COPD (HCC)   Pacemaker-St.Jude   CAD S/P previous PCI   Type 2 diabetes mellitus (HCC)   Permanent atrial fibrillation (HCC)   Depression   Chronic anticoagulation   Chronic combined systolic and diastolic congestive heart failure (HCC)   COPD with acute exacerbation (HCC)   GERD (gastroesophageal reflux disease)   BPH (benign prostatic hyperplasia)   Malnutrition of moderate degree   Atrial fibrillation with RVR (HCC)   Cardiomyopathy, ischemic   Palliative care encounter  1. Permanent atrial fib with RVR - intermittent NSVT - Driven by underlying respiratory disease. Rates remain difficult to control, trending in the low 80-low 100s max dose Diltiazem,  now toprol, and Lanoxin. CHA2DS2VASc = 6 Eliquis. No significant improvement in rate after IV Amiodarone bolus on 1/26. Rate now improved and at times occ paced beat.  Max dose CCB, BBB and digoxin. Bisoprolol changed to Toprol BP in the 80s systolic at night  + aberrancy vs 3 beats NSVT at times.  2. CAD with hx MI; neg troponin, now chest pain- constant. Today will check EKG and troponin.  May be MS with cough.  3. CHF (combined systolic and diastolic, chronic) - . See below  4. COPD with acute exacerbation - improved from ICU but very fragile. Palliative care has been consulted but pt and family feel he will bounce back.   5. Delirium On Haldol prn.   7. Abnormal TSH- 0.250  8. Severe cardiomyopathy- EF 20-25% 10/09/15- (new drop in LVF since 2014)    LOS: 16 days   Time spent with pt. : 15 minutes. [facility]  Nurse Practitioner Certified Pager [phone] or after 5pm and on  weekends call [phone] 10/16/2015, 8:26 AM   I have examined the patient and reviewed assessment and plan and discussed with patient.  Agree with above as stated.  May be somewhat volume overloaded.  BNP was increased a few weeks ago. Left arm swollen.  On 6L O2 .  Only on low dose Lasix.  Will give a dose of IV Lasix.  He has had some chest pain while eating and with swallowing.  Diet has been modified.  Rate control adequate.  AFib and loss of atrial kick could be contributing to volume overload.  Hannan Hutmacher S.

## 2015-10-16 NOTE — Progress Notes (Signed)
Daily Progress Note   Patient Name: [name]       Date: 10/16/2015 DOB: 08-15-1928  Age: 82 y.o. MRN#: [id number] Attending Physician: Florencia Reasons, MD Primary Care Physician: Marton Redwood, MD Admit Date: 09/30/2015  Reason for Consultation/Follow-up: Establishing goals of care  Subjective: I met with [name] along with his daughter, Tomi Bamberger, at bedside. Very difficult conversation as [name] tells me "I'm tired and ready to go" and "I don't want to do this anymore." Tomi Bamberger asks him if he is ready to go be with her mom and he says yes. Tomi Bamberger is very tearful and I ask if he has ever spoken this way and she says that he has every time he gets this sick he talks like this but then he has gone to rehab and recovered to return to living fairly independently. They understand that there is not really a lot that we have to offer to reverse further decline. He does want out of the hospital and agrees to try rehab. Discussed plan to initiate lidoderm patch, low dose Cymbalta (may help with spinal stenosis pain and depression) - will need increased if tolerated after ~1 week, changed IV morphine to roxanol. Also discussed plan with family to transition to rehab with palliative - after 1 week if he is still not eating or participating in therapy then transition to hospice care. Emotional support provided.   Length of Stay: 16 days  Current Medications: Scheduled Meds:  . antiseptic oral rinse  7 mL Mouth Rinse q12n4p  . apixaban  5 mg Oral BID  . arformoterol  15 mcg Nebulization BID  . bisacodyl  10 mg Rectal Daily  . budesonide (PULMICORT) nebulizer solution  0.5 mg Nebulization BID  . chlorhexidine  15 mL Mouth Rinse BID  . dextromethorphan-guaiFENesin  1 tablet Oral BID  . digoxin   0.125 mg Oral Daily  . diltiazem  360 mg Oral Daily  . DULoxetine  30 mg Oral Daily  . feeding supplement (ENSURE ENLIVE)  237 mL Oral BID BM  . finasteride  5 mg Oral Daily  . furosemide  40 mg Intravenous Once  . furosemide  20 mg Oral Daily  . insulin aspart  0-20 Units Subcutaneous TID WC  . insulin aspart  0-5 Units Subcutaneous QHS  . lidocaine  1 patch Transdermal Q24H  . lidocaine  1 patch Transdermal Once  . megestrol  400 mg Oral Daily  . metoprolol succinate  200 mg Oral Daily  . nystatin  5 mL Oral QID  . polyethylene glycol  17 g Oral Daily  . predniSONE  40 mg Oral Q breakfast  . rosuvastatin  40 mg Oral Daily  . senna-docusate  2 tablet Oral BID  . sodium chloride  3 mL Intravenous Q12H  . tamsulosin  0.4 mg Oral BID    Continuous Infusions:    PRN Meds: acetaminophen **OR** acetaminophen, fluticasone, levalbuterol, metoprolol, morphine CONCENTRATE, nitroGLYCERIN, [DISCONTINUED] ondansetron **OR** ondansetron (ZOFRAN) IV  Physical Exam: Physical Exam  Constitutional: He is oriented to person, place, and time. He appears well-developed.  HENT:  Head: Normocephalic and atraumatic.  Cardiovascular: Normal rate.  An irregular rhythm present.  Pulmonary/Chest: No accessory muscle usage. No respiratory distress. He has decreased breath sounds.  Abdominal: Soft. Normal appearance.  Neurological: He is alert and oriented to person, place, and time.                Vital Signs: BP 90/54 mmHg  Pulse 69  Temp(Src) 97.8 F (36.6 C) (Axillary)  Resp 16  Ht 5' 9"  (1.753 m)  Wt 76.4 kg (168 lb 6.9 oz)  BMI 24.86 kg/m2  SpO2 99% SpO2: SpO2: 99 % O2 Device: O2 Device: Nasal Cannula O2 Flow Rate: O2 Flow Rate (L/min): 6 L/min  Intake/output summary:  Intake/Output Summary (Last 24 hours) at 10/16/15 1427 Last data filed at 10/16/15 0900  Gross per 24 hour  Intake    860 ml  Output   1100 ml  Net   -240 ml   LBM: Last BM Date: 10/15/15 Baseline Weight: Weight:  82.4 kg (181 lb 10.5 oz) Most recent weight: Weight: 76.4 kg (168 lb 6.9 oz)       Palliative Assessment/Data:   Additional Data Reviewed: CBC    Component Value Date/Time   WBC 23.4* 10/16/2015 0550   RBC 5.26 10/16/2015 0550   HGB 15.9 10/16/2015 0550   HCT 48.9 10/16/2015 0550   PLT 120* 10/16/2015 0550   MCV 93.0 10/16/2015 0550   MCH 30.2 10/16/2015 0550   MCHC 32.5 10/16/2015 0550   RDW 13.4 10/16/2015 0550   LYMPHSABS 2.1 09/30/2015 0300   MONOABS 1.1* 09/30/2015 0300   EOSABS 0.0 09/30/2015 0300   BASOSABS 0.0 09/30/2015 0300    CMP     Component Value Date/Time   NA 134* 10/16/2015 0550   K 4.1 10/16/2015 0550   CL 96* 10/16/2015 0550   CO2 29 10/16/2015 0550   GLUCOSE 86 10/16/2015 0550   BUN 40* 10/16/2015 0550   CREATININE 0.81 10/16/2015 0550   CALCIUM 8.2* 10/16/2015 0550   PROT 4.7* 10/16/2015 0550   ALBUMIN 2.5* 10/16/2015 0550   AST 34 10/16/2015 0550   ALT 31 10/16/2015 0550   ALKPHOS 103 10/16/2015 0550   BILITOT 1.1 10/16/2015 0550   GFRNONAA >60 10/16/2015 0550   GFRAA >60 10/16/2015 0550       Problem List:  Patient Active Problem List   Diagnosis Date Noted  . Cardiomyopathy, ischemic 10/13/2015  . Palliative care encounter 10/13/2015  . Atrial fibrillation with RVR ([place])   . Malnutrition of moderate degree 10/02/2015  . GERD (gastroesophageal reflux disease) 09/30/2015  . BPH (benign prostatic hyperplasia) 09/30/2015  . COPD, severe ([place]) 01/16/2015  . Chronic respiratory failure with hypoxia ([place]) 01/16/2015  .  Post-nasal drainage 01/16/2015  . COLD (chronic obstructive lung disease) ([place]) 05/09/2014  . Atrial fibrillation ([place]) 10/06/2013  . Hypertension 10/06/2013  . COPD with acute exacerbation ([place]) 09/09/2013  . Ventricular tachycardia ([place]) 06/15/2013  . Cancer screening, for lung 12/12/2012  . Chronic combined systolic and diastolic congestive heart failure ([place]) 09/28/2012  . Leg weakness, bilateral 04/13/2012  .  E-coli UTI 04/09/2012  . Hyponatremia 04/09/2012  . Chronic anticoagulation 04/04/2012    Class: Chronic  . Depression 03/16/2012    Class: Chronic  . Type 2 diabetes mellitus ([place]) 03/11/2012  . Chest pain 03/11/2012  . Permanent atrial fibrillation ([place]) 03/11/2012  . Hypoxemia 03/10/2012  . Bronchitis 03/10/2012  . Pulmonary edema 03/10/2012  . Acute-on-chronic respiratory failure ([place]) 11/15/2011  . Acute on chronic combined systolic and diastolic heart failure ([place]) 11/11/2011  . Spiritual distress 11/08/2011  . COPD exacerbation ([place]) 11/07/2011  . Dyspnea 08/27/2011  . HLD (hyperlipidemia) 08/27/2011  . NSTEMI (non-ST elevated myocardial infarction) ([place]) 08/15/2011  . CAD S/P previous PCI 08/15/2011  . Unstable angina ([place]) 08/13/2011  . Post-nasal drip 04/26/2011  . Pacemaker-St.Jude 04/02/2011  . CHF (congestive heart failure) ([place]) 12/26/2010  . Abdominal aortic aneurysm ([place]) 12/26/2010  . Emphysema/COPD ([place]) 12/26/2010     Palliative Care Assessment & Plan    1.Code Status:  DNR    Code Status Orders        Start     Ordered   09/30/15 0534  Do not attempt resuscitation (DNR)   Continuous    Question Answer Comment  In the event of cardiac or respiratory ARREST Do not call a "code blue"   In the event of cardiac or respiratory ARREST Do not perform Intubation, CPR, defibrillation or ACLS   In the event of cardiac or respiratory ARREST Use medication by any route, position, wound care, and other measures to relive pain and suffering. May use oxygen, suction and manual treatment of airway obstruction as needed for comfort.      09/30/15 0534    Code Status History    Date Active Date Inactive Code Status Order ID Comments User Context   03/27/2014 10:53 PM 03/29/2014  2:51 PM DNR [id number]  [name], MD Inpatient   12/31/2013  5:02 PM 01/06/2014  6:19 PM DNR [id number]  [name], MD ED   09/09/2013  4:03 PM 09/17/2013  2:54 PM DNR [id number]   [name], MD Inpatient   07/09/2013  7:51 PM 07/13/2013  1:34 PM DNR [id number]  [name], MD Inpatient   07/09/2013  1:18 PM 07/09/2013  7:51 PM Full Code [id number]  [name], MD ED   09/28/2012  7:29 AM 10/01/2012  6:12 PM DNR [id number]  [name], MD Inpatient   04/04/2012  1:02 PM 04/14/2012  2:29 PM DNR [id number]  [name], MD ED   04/04/2012 12:57 PM 04/04/2012  1:02 PM DNR [id number]  [name], MD ED   03/10/2012  5:58 PM 03/19/2012  4:40 PM DNR [id number]  [name], RN Inpatient   11/13/2011 11:11 AM 11/15/2011  8:15 PM DNR [id number]  [name], NP Inpatient   11/08/2011  3:03 PM 11/13/2011 11:10 AM Partial Code [id number]  Brand Males, MD Inpatient   11/07/2011  4:13 PM 11/08/2011  3:03 PM Full Code [id number]  [name], RN Inpatient    Advance Directive Documentation        Most Recent Value   Type of Advance Directive  Healthcare  Power of Attorney, Living will   Pre-existing out of facility DNR order (yellow form or pink MOST form)     "MOST" Form in Place?         2. Goals of Care/Additional Recommendations:  Trial of rehab - if continues to decline transition to hospice care.   Limitations on Scope of Treatment: Avoid Hospitalization  Desire for further Chaplaincy support:yes  Psycho-social Needs: Caregiving  Support/Resources and Education on Hospice  3. Symptom Management:      1. Pain mostly complains of back pain: Lidoderm to lower back. Cymbalta 30 mg qhs (for depression as well) - increase ~1 week if tolerating. Roxanol 2.5 mg every 2 hours prn (equivalent to ~ 1 mg IV morphine - may increase to 5 mg if not effective).      2. Dyspnea: Utilize roxanol.      3. Depression? Cymbalta.   4. Palliative Prophylaxis:   Bowel Regimen, Frequent Pain Assessment and Oral Care  5. Prognosis: Could be weeks with continued decline or no improvement and focus more on comfort care (not eating). < 6 months.   6. Discharge  Planning:  [place] for rehab with Palliative care service follow-up. Tomi Bamberger has worked with Moishe Spice, NP doing palliative care in SNF previously - encouraged her to utilize her again to assist with next steps depending on how he does in SNF.    Thank you for allowing the Palliative Medicine Team to assist in the care of this patient.   Time In: 1330 Time Out: 1430 Total Time 67mn Prolonged Time Billed  no         APershing Proud NP  13/83/7793 2:27 PM  Please contact Palliative Medicine Team phone at [phone] questions and concerns.

## 2015-10-16 NOTE — Progress Notes (Signed)
[facility] PROGRESS NOTE    Progress Note   DAYVIN ABER GMW:[id number] DOB: 02-10-1928 DOA: 09/30/2015 PCP: Martha Clan, MD   Brief Narrative:   DAMARIAN PRIOLA is an 82 y.o. male With past medical history of advanced COPD on home oxygen, diabetes mellitus, CAD atrial fibrillation on apixiban chronic systolic and diastolic heart failure comes to the ED complaining of cough and shortness of breath. She was found to be in acute COPD exacerbation placed on BiPAP and admitted to the hospital. She was started on IV Solu-Medrol steroids and PCCM was consulted. Patient  Cannot tolerate BiPAP when necessary.  Assessment/Plan:   Acute-on-chronic respiratory failure (HCC) Due to acute COPD exacerbation/acute metabolic encephalopathy: PCCM was consulted and recommended to continue BiPAP and IV morphine.   he is on chronic prednisone at home, currently on  IV steroids for an additional 24 hours a repeat a chest x-ray does not show any acute findings. As per critical care he is similar as he was a few years ago when they discuss hospice but he seems to bounce back. Dr Robb Matar d/w the patient again he would like to cont aggressive treatment.  not making progress,  pulm recommended hospice care.  Palliative care consulted, daughter is hesitating to initial hospice now, but agreed to patient being discharged to snf with palliative care consult and to continue to discuss hospice options at SNF if patient not able to get better.  Atrial Fibrillation with RVR CHA2DS2-VASc Score is 6. Patient is on Eliquis. Heart rate is hard to control continues to be in mild RVR Cardiology on board, appreciate assistance, heart rate continues to improve. Continues to be in RVR despite increasing diltiazem. Appreciate cardiology's assistance. Continue adjust med, heart rate difficult to control.  Chronic, systolic and diastolic heart failure: Seems to be euvolemic, continue current regimen. The echo was done  that shows an ejection fraction of 25% On low dose lasix  CAD: Asymptomatic continue medical management.   Controlled diabetes mellitus type 2: A1c was 6.7. Titrate Lantus, his blood glucose continues to be high likely due to steroids. Taper steroids Had hypoglycemia episode on 1/30 , d/c lantus, continue ssi, patient refusing nutrition supplement, will try megace  Acute confusional state: has required prn haldol and morphine, daughter concerned that patient might be too sedated from iv morphine,  iv morphine decreased to  prn, change haldol to prn dose Resolved, continue Haldol when necessary.  Has not need haldol in the last two days  BPH: Continue Flomax and Proscar  Constipation: started stool regimen, declined enema, states will waiting for a while before thinking about enema  Malnutrition of moderate degree, nutrition supplement  Oral thrush: agreed to topical nystatin, add diflucan    DVT Prophylaxis - on apixaban  Family Communication: patient  Disposition Plan: Pulmonary recommended palliative care and signed off, snf with palliative care continue to follow at SNF on  1/31 Code Status:     Code Status Orders        Start     Ordered   09/30/15 0534  Do not attempt resuscitation (DNR)   Continuous    Question Answer Comment  In the event of cardiac or respiratory ARREST Do not call a "code blue"   In the event of cardiac or respiratory ARREST Do not perform Intubation, CPR, defibrillation or ACLS   In the event of cardiac or respiratory ARREST Use medication by any route, position, wound care, and other measures to relive pain and suffering.  May use oxygen, suction and manual treatment of airway obstruction as needed for comfort.      09/30/15 0534    Code Status History    Date Active Date Inactive Code Status Order ID Comments User Context   03/27/2014 10:53 PM 03/29/2014  2:51 PM DNR [id number]  Alysia Penna, MD Inpatient   12/31/2013  5:02 PM 01/06/2014   6:19 PM DNR [id number]  Marrian Salvage, MD ED   09/09/2013  4:03 PM 09/17/2013  2:54 PM DNR [id number]  Hoyle Sauer, MD Inpatient   07/09/2013  7:51 PM 07/13/2013  1:34 PM DNR [id number]  Jarome Matin, MD Inpatient   07/09/2013  1:18 PM 07/09/2013  7:51 PM Full Code [id number]  Jarome Matin, MD ED   09/28/2012  7:29 AM 10/01/2012  6:12 PM DNR [id number]  Kari Baars, MD Inpatient   04/04/2012  1:02 PM 04/14/2012  2:29 PM DNR [id number]  Jarome Matin, MD ED   04/04/2012 12:57 PM 04/04/2012  1:02 PM DNR [id number]  Jarome Matin, MD ED   03/10/2012  5:58 PM 03/19/2012  4:40 PM DNR [id number]  Tammy Sours, RN Inpatient   11/13/2011 11:11 AM 11/15/2011  8:15 PM DNR [id number]  Lorinda Creed, NP Inpatient   11/08/2011  3:03 PM 11/13/2011 11:10 AM Partial Code [id number]  Kalman Shan, MD Inpatient   11/07/2011  4:13 PM 11/08/2011  3:03 PM Full Code [id number]  Theora Gianotti, RN Inpatient    Advance Directive Documentation        Most Recent Value   Type of Advance Directive  Healthcare Power of Attorney, Living will   Pre-existing out of facility DNR order (yellow form or pink MOST form)     "MOST" Form in Place?          IV Access:    Peripheral IV   Procedures and diagnostic studies:   No results found.   Medical Consultants:    None.  Anti-Infectives:   Anti-infectives    Start     Dose/Rate Route Frequency Ordered Stop   10/15/15 1200  fluconazole (DIFLUCAN) IVPB 200 mg     200 mg 100 mL/hr over 60 Minutes Intravenous  Once 10/15/15 1149 10/15/15 1401   10/02/15 1000  cefTRIAXone (ROCEPHIN) 1 g in dextrose 5 % 50 mL IVPB  Status:  Discontinued     1 g 100 mL/hr over 30 Minutes Intravenous Every 24 hours 10/02/15 0912 10/09/15 1301   10/01/15 1000  azithromycin (ZITHROMAX) tablet 250 mg  Status:  Discontinued     250 mg Oral Daily 09/30/15 0530 10/02/15 0920   09/30/15 1000  azithromycin (ZITHROMAX) tablet 500 mg     500 mg Oral Daily 09/30/15 0530 09/30/15 0944       Subjective:    Iva B Krisko  Very frail,  Seems less agitated this am, intermittent upper airway gurgling, patient does not want to be bothered, does not want to talk too much,  He is express wishes to be discharged to snf.  Objective:    Filed Vitals:   10/15/15 2150 10/16/15 0555 10/16/15 0647 10/16/15 0820  BP: 114/45 80/41 107/58   Pulse: 51 57 86   Temp: 97.6 F (36.4 C) 97.8 F (36.6 C)    TempSrc: Axillary Axillary    Resp: 20 16    Height:      Weight:  76.4 kg (168 lb 6.9 oz)    SpO2: 92% 98%  94%    Intake/Output Summary (Last  24 hours) at 10/16/15 1105 Last data filed at 10/16/15 0900  Gross per 24 hour  Intake    860 ml  Output   1100 ml  Net   -240 ml   Filed Weights   10/14/15 0500 10/15/15 0700 10/16/15 0555  Weight: 77.1 kg (169 lb 15.6 oz) 78.291 kg (172 lb 9.6 oz) 76.4 kg (168 lb 6.9 oz)    Exam: Gen:  Very frail, no gurgling heard today, lethargic with intermittent agitation Cardiovascular:  IRRR. Chest and lungs:   Overall diminished, no wheezing today Abdomen:  Abdomen soft, NT/ND, + BS Extremities:  No edema. Skin: diffuse erythema upper extremity, worse on the left.   Data Reviewed:    Labs: Basic Metabolic Panel:  Recent Labs Lab 10/10/15 0426 10/11/15 0345  10/12/15 0355 10/13/15 0506 10/15/15 0515 10/16/15 0550  NA  --   --   --  138  --  132* 134*  K  --   --   < > 4.0  --  4.5 4.1  CL  --   --   --  94*  --  94* 96*  CO2  --   --   --  35*  --  30 29  GLUCOSE  --   --   --  99  --  166* 86  BUN  --   --   --  35*  --  41* 40*  CREATININE  --   --   --  0.79  --  0.75 0.81  CALCIUM  --   --   --  8.5*  --  8.0* 8.2*  MG 2.3 2.2  --  2.2 2.3  --   --   PHOS 2.5 2.7  --  3.0 3.3  --   --   < > = values in this interval not displayed. GFR Estimated Creatinine Clearance: 66.7 mL/min (by C-G formula based on Cr of 0.81). Liver Function Tests:  Recent Labs Lab 10/16/15 0550  AST 34  ALT 31  ALKPHOS 103   BILITOT 1.1  PROT 4.7*  ALBUMIN 2.5*   No results for input(s): LIPASE, AMYLASE in the last 168 hours. No results for input(s): AMMONIA in the last 168 hours. Coagulation profile  Recent Labs Lab 10/16/15 0550  INR 1.84*    CBC:  Recent Labs Lab 10/12/15 0355 10/16/15 0550  WBC 19.3* 23.4*  HGB 15.2 15.9  HCT 48.2 48.9  MCV 95.3 93.0  PLT 154 120*   Cardiac Enzymes: No results for input(s): CKTOTAL, CKMB, CKMBINDEX, TROPONINI in the last 168 hours. BNP (last 3 results) No results for input(s): PROBNP in the last 8760 hours. CBG:  Recent Labs Lab 10/15/15 1658 10/15/15 1720 10/15/15 2114 10/16/15 0740 10/16/15 0807  GLUCAP 48* 79 64* 51* 75   D-Dimer: No results for input(s): DDIMER in the last 72 hours. Hgb A1c: No results for input(s): HGBA1C in the last 72 hours. Lipid Profile: No results for input(s): CHOL, HDL, LDLCALC, TRIG, CHOLHDL, LDLDIRECT in the last 72 hours. Thyroid function studies: No results for input(s): TSH, T4TOTAL, T3FREE, THYROIDAB in the last 72 hours.  Invalid input(s): FREET3 Anemia work up: No results for input(s): VITAMINB12, FOLATE, FERRITIN, TIBC, IRON, RETICCTPCT in the last 72 hours. Sepsis Labs:  Recent Labs Lab 10/12/15 0355 10/16/15 0550  WBC 19.3* 23.4*   Microbiology No results found for this or any previous visit (from the past 240 hour(s)).   Medications:   . antiseptic oral  rinse  7 mL Mouth Rinse q12n4p  . apixaban  5 mg Oral BID  . arformoterol  15 mcg Nebulization BID  . bisacodyl  10 mg Rectal Daily  . budesonide (PULMICORT) nebulizer solution  0.5 mg Nebulization BID  . chlorhexidine  15 mL Mouth Rinse BID  . dextromethorphan-guaiFENesin  1 tablet Oral BID  . digoxin  0.125 mg Oral Daily  . diltiazem  360 mg Oral Daily  . feeding supplement (ENSURE ENLIVE)  237 mL Oral BID BM  . finasteride  5 mg Oral Daily  . furosemide  20 mg Oral Daily  . insulin aspart  0-20 Units Subcutaneous TID WC  .  insulin aspart  0-5 Units Subcutaneous QHS  . insulin glargine  20 Units Subcutaneous BID  . megestrol  400 mg Oral Daily  . metoprolol succinate  200 mg Oral Daily  . nystatin  5 mL Oral QID  . polyethylene glycol  17 g Oral Daily  . predniSONE  40 mg Oral Q breakfast  . rosuvastatin  40 mg Oral Daily  . senna-docusate  2 tablet Oral BID  . sodium chloride  3 mL Intravenous Q12H  . tamsulosin  0.4 mg Oral BID   Continuous Infusions:   Time spent: 25 min   LOS: 16 days   Lelynd Poer MD PhD  [facility] Pager [phone]  *Please refer to [email or web address], password [email or web address] to get updated schedule on who will round on this patient, as hospitalists switch teams weekly. If 7PM-7AM, please contact night-coverage at [email or web address], password [email or web address] for any overnight needs.  10/16/2015, 11:05 AM

## 2015-10-16 NOTE — Progress Notes (Signed)
PT Cancellation Note  Patient Details Name: [name] MRN: [id number] DOB: November 12, 1927   Cancelled Treatment:    Reason Eval/Treat Not Completed: Pain limiting ability to participate (pt declines at this time, RN in to medicate pt)   Jasalyn Frysinger,KATHrine E 10/16/2015, 10:40 AM Zenovia Jarred, PT, DPT 10/16/2015 Pager: [phone]

## 2015-10-16 NOTE — Progress Notes (Signed)
Pt BP is 92/55. Paged Cardiac NP for low BP and asked about lasix dose. Nada Boozer, NP gave orders to go ahead and give Lasix. Will complete orders and continue to monitor.

## 2015-10-16 NOTE — Progress Notes (Signed)
Hypoglycemic Event  CBG: 51  Treatment: 15 GM carbohydrate snack  Symptoms: None  Follow-up CBG: Time: 0805 CBG Result: 75  Possible Reasons for Event: Inadequate meal intake  Comments/MD notified: MD notified and made aware    Craig Staggers

## 2015-10-17 LAB — BASIC METABOLIC PANEL
ANION GAP: 9 (ref 5–15)
BUN: 43 mg/dL — ABNORMAL HIGH (ref 6–20)
CHLORIDE: 91 mmol/L — AB (ref 101–111)
CO2: 33 mmol/L — AB (ref 22–32)
Calcium: 8 mg/dL — ABNORMAL LOW (ref 8.9–10.3)
Creatinine, Ser: 0.99 mg/dL (ref 0.61–1.24)
GFR calc non Af Amer: >60 mL/min (ref >60)
Glucose, Bld: 162 mg/dL — ABNORMAL HIGH (ref 65–99)
POTASSIUM: 4 mmol/L (ref 3.5–5.1)
SODIUM: 133 mmol/L — AB (ref 135–145)

## 2015-10-17 LAB — GLUCOSE, CAPILLARY
GLUCOSE-CAPILLARY: 130 mg/dL — AB (ref 65–99)
GLUCOSE-CAPILLARY: 157 mg/dL — AB (ref 65–99)

## 2015-10-17 LAB — BRAIN NATRIURETIC PEPTIDE: B NATRIURETIC PEPTIDE 5: 290.4 pg/mL — AB (ref 0.0–100.0)

## 2015-10-17 LAB — TROPONIN I: Troponin I: 0.25 ng/mL — ABNORMAL HIGH (ref <0.031)

## 2015-10-17 MED ORDER — LIDOCAINE 5 % EX PTCH
1.0000 | MEDICATED_PATCH | CUTANEOUS | Status: AC
Start: 2015-10-17 — End: ?

## 2015-10-17 MED ORDER — DILTIAZEM HCL ER COATED BEADS 360 MG PO CP24: 360.0000 mg | ORAL_CAPSULE | Freq: Every day | ORAL | Status: AC

## 2015-10-17 MED ORDER — PREDNISONE 10 MG PO TABS: 10.0000 mg | ORAL_TABLET | Freq: Every day | ORAL | Status: AC

## 2015-10-17 MED ORDER — MORPHINE SULFATE (CONCENTRATE) 10 MG/0.5ML PO SOLN: 2.5000 mg | ORAL | Status: AC | PRN

## 2015-10-17 MED ORDER — FUROSEMIDE 20 MG PO TABS: 40.0000 mg | ORAL_TABLET | Freq: Every day | ORAL | Status: AC

## 2015-10-17 MED ORDER — DULOXETINE HCL 30 MG PO CPEP: 30.0000 mg | ORAL_CAPSULE | Freq: Every day | ORAL | Status: AC

## 2015-10-17 MED ORDER — LEVALBUTEROL HCL 0.63 MG/3ML IN NEBU: 0.6300 mg | INHALATION_SOLUTION | Freq: Four times a day (QID) | RESPIRATORY_TRACT | Status: AC | PRN

## 2015-10-17 MED ORDER — POLYETHYLENE GLYCOL 3350 17 G PO PACK: 17.0000 g | PACK | Freq: Every day | ORAL | Status: AC

## 2015-10-17 MED ORDER — ENSURE ENLIVE PO LIQD: 237.0000 mL | Freq: Two times a day (BID) | ORAL | Status: AC

## 2015-10-17 MED ORDER — NYSTATIN 100000 UNIT/ML MT SUSP: 5.0000 mL | Freq: Four times a day (QID) | OROMUCOSAL | Status: AC

## 2015-10-17 MED ORDER — MEGESTROL ACETATE 400 MG/10ML PO SUSP: 400.0000 mg | Freq: Every day | ORAL | Status: AC

## 2015-10-17 MED ORDER — FUROSEMIDE 20 MG PO TABS: 40.0000 mg | ORAL_TABLET | Freq: Every day | ORAL | Status: DC

## 2015-10-17 MED ORDER — METOPROLOL SUCCINATE ER 200 MG PO TB24: 200.0000 mg | ORAL_TABLET | Freq: Every day | ORAL | Status: AC

## 2015-10-17 NOTE — Clinical Social Work Placement (Signed)
Patient is set to discharge to [facility] today. Patient & daughter, [name] aware. Discharge packet given to RN, Catie. [facility] called for transport.     [name], LCSW [facility] Clinical Social Worker cell #: [phone]    CLINICAL SOCIAL WORK PLACEMENT  NOTE  Date:  10/17/2015  Patient Details  Name: [name] MRN: [id number] Date of Birth: 06-22-1928  Clinical Social Work is seeking post-discharge placement for this patient at the Skilled  Nursing Facility level of care (*CSW will initial, date and re-position this form in  chart as items are completed):  Yes   Patient/family provided with [facility] Clinical Social Work Department's list of facilities offering this level of care within the geographic area requested by the patient (or if unable, by the patient's family).  Yes   Patient/family informed of their freedom to choose among providers that offer the needed level of care, that participate in Medicare, Medicaid or managed care program needed by the patient, have an available bed and are willing to accept the patient.  Yes   Patient/family informed of [facility]'s ownership interest in [facility] and [facility], as well as of the fact that they are under no obligation to receive care at these facilities.  PASRR submitted to EDS on 10/10/15     PASRR number received on 10/10/15     Existing PASRR number confirmed on       FL2 transmitted to all facilities in geographic area requested by pt/family on 10/10/15     FL2 transmitted to all facilities within larger geographic area on       Patient informed that his/her managed care company has contracts with or will negotiate with certain facilities, including the following:        Yes   Patient/family informed of bed offers received.  Patient chooses bed at [facility]     Physician recommends and patient chooses bed at      Patient to be transferred to [facility] on  10/17/15.  Patient to be transferred to facility by [facility]     Patient family notified on 10/17/15 of transfer.  Name of family member notified:  patient's daughter, [name] via phone     PHYSICIAN       Additional Comment:    _______________________________________________ Arlyss Repress, LCSW 10/17/2015, 9:10 AM

## 2015-10-17 NOTE — Care Management Important Message (Signed)
Important Message  Patient Details  Name: [name] MRN: [id number] Date of Birth: Dec 20, 1927   Medicare Important Message Given:  Yes    Haskell Flirt 10/17/2015, 9:24 AMImportant Message  Patient Details  Name: [name] MRN: [id number] Date of Birth: 05/26/1928   Medicare Important Message Given:  Yes    Haskell Flirt 10/17/2015, 9:24 AM

## 2015-10-17 NOTE — Progress Notes (Signed)
Attempted to call report x3 at [facility]. Pt will be transported via [facility].

## 2015-10-17 NOTE — Progress Notes (Signed)
Patient Profile: 82 year old male with hx of severe COPD on chronic O2, CAF-on Eliquis, CAD (STEMI in 2011 and 2012 - PCI with DES to LCX, chronic total occl of RCA with Lt->rt collaterals, nonobstructive LAD stenosis) Hx of ICM -CHF with EF 25% (echo 10/09/15), St Jude PTVDP, NIDDM, HLD, and BPH. Admitted 09/30/15 for COPD exacerbation, found to be in atrial fibrillation w/ RVR. Lungs have improved pt now on tele. HR improved this am on amiodarone. now controlled off of amiodarone   Subjective: SOB but states he is comfortable, today some abd pain.   Objective: Vital signs in last 24 hours: Temp:  [97.4 F (36.3 C)-97.6 F (36.4 C)] 97.4 F (36.3 C) (01/31 0615) Pulse Rate:  [69-87] 86 (01/31 0615) Resp:  [18] 18 (01/31 0615) BP: (90-105)/(54-66) 100/66 mmHg (01/31 0615) SpO2:  [95 %-99 %] 98 % (01/31 0615) Weight:  [167 lb 11.2 oz (76.068 kg)] 167 lb 11.2 oz (76.068 kg) (01/31 0500) Weight change: -11.7 oz (-0.332 kg) Last BM Date: 10/16/15 Intake/Output from previous day: -1140 after lasix 01/30 0701 - 01/31 0700 In: 480 [P.O.:480] Out: 1620 [Urine:1620] Intake/Output this shift: Total I/O In: 120 [P.O.:120] Out: -   PE: General:Pleasant affect, NAD per pt, still SOB Skin:Warm and dry, brisk capillary refill HEENT:normocephalic, sclera clear, mucus membranes moist Heart:irreg irreg without murmur, gallup, rub or click Lungs:diminished without rales, ++ rhonchi, no wheezes ZOX:WRUE, mild tenderness, + BS, do not palpate liver spleen or masses Ext:no lower ext edema,  2+ radial pulses Neuro:alert and oriented though asks for abnormal things, MAE, follows commands, + facial symmetry Tele: a fib rate controlled, 11 beats NSVT   Lab Results:  Recent Labs  10/16/15 0550  WBC 23.4*  HGB 15.9  HCT 48.9  PLT 120*   BMET  Recent Labs  10/16/15 0550 10/17/15 0115  NA 134* 133*  K 4.1 4.0  CL 96* 91*  CO2 29 33*  GLUCOSE 86 162*  BUN 40* 43*    CREATININE 0.81 0.99  CALCIUM 8.2* 8.0*    Recent Labs  10/16/15 2238 10/17/15 0115  TROPONINI 0.22* 0.25*    Lab Results  Component Value Date   CHOL 135 03/11/2012   HDL 32* 03/11/2012   LDLCALC 66 03/11/2012   TRIG 187* 03/11/2012   CHOLHDL 4.2 03/11/2012   Lab Results  Component Value Date   HGBA1C 6.7* 09/30/2015     Lab Results  Component Value Date   TSH 0.541 10/13/2015    Hepatic Function Panel  Recent Labs  10/16/15 0550  PROT 4.7*  ALBUMIN 2.5*  AST 34  ALT 31  ALKPHOS 103  BILITOT 1.1  BILIDIR 0.4  IBILI 0.7   No results for input(s): CHOL in the last 72 hours. No results for input(s): PROTIME in the last 72 hours.     Studies/Results: No results found.  Medications: I have reviewed the patient's current medications. Scheduled Meds: . antiseptic oral rinse  7 mL Mouth Rinse q12n4p  . apixaban  5 mg Oral BID  . arformoterol  15 mcg Nebulization BID  . bisacodyl  10 mg Rectal Daily  . budesonide (PULMICORT) nebulizer solution  0.5 mg Nebulization BID  . chlorhexidine  15 mL Mouth Rinse BID  . dextromethorphan-guaiFENesin  1 tablet Oral BID  . digoxin  0.125 mg Oral Daily  . diltiazem  360 mg Oral Daily  . DULoxetine  30 mg Oral Daily  . feeding supplement (ENSURE  ENLIVE)  237 mL Oral BID BM  . finasteride  5 mg Oral Daily  . furosemide  20 mg Oral Daily  . insulin aspart  0-20 Units Subcutaneous TID WC  . insulin aspart  0-5 Units Subcutaneous QHS  . lidocaine  1 patch Transdermal Q24H  . megestrol  400 mg Oral Daily  . metoprolol succinate  200 mg Oral Daily  . nystatin  5 mL Oral QID  . polyethylene glycol  17 g Oral Daily  . predniSONE  40 mg Oral Q breakfast  . rosuvastatin  40 mg Oral Daily  . senna-docusate  2 tablet Oral BID  . sodium chloride  3 mL Intravenous Q12H  . tamsulosin  0.4 mg Oral BID   Continuous Infusions:  PRN Meds:.acetaminophen **OR** acetaminophen, fluticasone, levalbuterol, metoprolol, morphine  CONCENTRATE, nitroGLYCERIN, [DISCONTINUED] ondansetron **OR** ondansetron (ZOFRAN) IV  Assessment/Plan: Principal Problem:   Acute-on-chronic respiratory failure (HCC) Active Problems:   Emphysema/COPD (HCC)   Pacemaker-St.Jude   CAD S/P previous PCI   Type 2 diabetes mellitus (HCC)   Permanent atrial fibrillation (HCC)   Depression   Chronic anticoagulation   Chronic combined systolic and diastolic congestive heart failure (HCC)   COPD with acute exacerbation (HCC)   GERD (gastroesophageal reflux disease)   BPH (benign prostatic hyperplasia)   Malnutrition of moderate degree   Atrial fibrillation with RVR (HCC)   Cardiomyopathy, ischemic   Palliative care encounter  1. Permanent atrial fib with RVR - intermittent NSVT - Driven by underlying respiratory disease. Rates remain difficult to control, trending in the low 80-low 100s max dose Diltiazem, now toprol, and Lanoxin. CHA2DS2VASc = 6 Eliquis. No significant improvement in rate after IV Amiodarone bolus on 1/26. Rate now improved and at times occ paced beat. Max dose CCB, BBB and digoxin. Bisoprolol changed to Toprol BP in the 80s systolic at night + a11beats NSVT  Rate controlled 80s to low 100s  2. CAD with hx MI; neg troponin, now chest pain- constant. Troponin now mildly elevated-no chest pain today but + abd pain. May be MS with cough.  Troponin 0.26   3. CHF (combined systolic and diastolic, chronic) - . See below,  BNP today 290 improved from 737  With IV lasix yesterday -1140,  Since admit -16109 BNP (last 3 results) Recent Labs  09/30/15 0300 10/01/15 0424 10/17/15 0115  BNP 287.0* 737.9* 290.4*    4. COPD with acute exacerbation - improved from ICU but very fragile. Palliative care has been consulted but pt and family feel he will bounce back. See below- increased rhonchi today  5. Delirium On Haldol prn.   7. Abnormal TSH- 0.250  8. Severe cardiomyopathy- EF 20-25% 10/09/15- (new drop in LVF since 2014)     To d/c to rehab today though may transition to hospice if no improvement at rehab.    LOS: 17 days   Time spent with pt. : 15 minutes. [facility] R  Nurse Practitioner Certified Pager [phone] or after 5pm and on weekends call [phone] 10/17/2015, 9:13 AM   I have examined the patient and reviewed assessment and plan and discussed with patient.  Agree with above as stated.  D/w Internal medicine yesterday.  Would send home on daily diuretic with f/u of renal function.  Low EF will predispose him to fluid retention and this can cause worsening respiratory function.  Lasix 40 mg daily would be a reasonable start dose.  Follow electrolytes.  Cord Wilczynski S.

## 2015-10-17 NOTE — Progress Notes (Signed)
Pt keeps removing nasal cannula. I have educated the patient on the importance of oxygen he is getting through the nasal cannula. Will continue to monitor.

## 2015-10-17 NOTE — Discharge Summary (Signed)
Discharge Summary  [name] HYQ:[id number] DOB: 20-Oct-1927  PCP: Martha Clan, MD  Admit date: 09/30/2015 Discharge date: 10/17/2015  Time spent: >62mins  Recommendations for Outpatient Follow-up:  1. F/u with PMD at SNF as soon as possible 2. Palliative care to continue follow patient, possible transition to hospice if patient does not improve  Discharge Diagnoses:  Active Hospital Problems   Diagnosis Date Noted  . Acute-on-chronic respiratory failure (HCC) 11/15/2011  . Cardiomyopathy, ischemic 10/13/2015  . Palliative care encounter 10/13/2015  . Atrial fibrillation with RVR (HCC)   . Malnutrition of moderate degree 10/02/2015  . GERD (gastroesophageal reflux disease) 09/30/2015  . BPH (benign prostatic hyperplasia) 09/30/2015  . COPD with acute exacerbation (HCC) 09/09/2013  . Chronic combined systolic and diastolic congestive heart failure (HCC) 09/28/2012  . Chronic anticoagulation 04/04/2012    Class: Chronic  . Depression 03/16/2012    Class: Chronic  . Permanent atrial fibrillation (HCC) 03/11/2012  . Type 2 diabetes mellitus (HCC) 03/11/2012  . CAD S/P previous PCI 08/15/2011  . Pacemaker-St.Jude 04/02/2011  . Emphysema/COPD [facility]) 12/26/2010    Resolved Hospital Problems   Diagnosis Date Noted Date Resolved  No resolved problems to display.    Discharge Condition: stable  Diet recommendation: dysphagia diet three, thin liquid  Filed Weights   10/15/15 0700 10/16/15 0555 10/17/15 0500  Weight: 78.291 kg (172 lb 9.6 oz) 76.4 kg (168 lb 6.9 oz) 76.068 kg (167 lb 11.2 oz)    History of present illness:  [name] is a 82 y.o. male with PMH of severe COPD, hypertension, hyperlipidemia, diabetes mellitus, GERD, pacemaker placement, CAD, stent placement, atrial fibrillation on Eliquis, combined systolic and diastolic congestive heart failure with EF of 35-40 percent, AAA, PVD, BPH, who presents with cough and shortness of breath.  Patient reports  that since last night he has been having progressively worsening shortness of breath. She also has cough with sputum production. He does not have chest pain, fever or chills. No abdominal pain, diarrhea, symptoms of UTI or unilateral weakness. His pulmonologist is Dr. Marchelle Gearing, last seen was on 09/27/15. Per Dr. Jane Canary note, patient refused Daliresp.   In ED, patient was found to have WBC 10.8, tachycardia, tachypnea, renal function okay, negative troponin. Chest x-ray showed COPD without infiltration. ABG showed a pH 7.374, PCO2 40.7, PO2 284.  EKG: Independently reviewed. QTC 398, LAD, atrial fibrillation, widened QRS wave which is similar to previous EKG on 07/10/15.  Hospital Course:  Principal Problem:   Acute-on-chronic respiratory failure (HCC) Active Problems:   Emphysema/COPD (HCC)   Pacemaker-St.Jude   CAD S/P previous PCI   Type 2 diabetes mellitus (HCC)   Permanent atrial fibrillation (HCC)   Depression   Chronic anticoagulation   Chronic combined systolic and diastolic congestive heart failure (HCC)   COPD with acute exacerbation (HCC)   GERD (gastroesophageal reflux disease)   BPH (benign prostatic hyperplasia)   Malnutrition of moderate degree   Atrial fibrillation with RVR (HCC)   Cardiomyopathy, ischemic   Palliative care encounter  Acute-on-chronic respiratory failure (HCC) Due to acute COPD exacerbation/acute metabolic encephalopathy: PCCM was consulted and recommended to continue BiPAP and IV morphine.  he is on chronic prednisone at home, currently on IV steroids for an additional 24 hours a repeat a chest x-ray does not show any acute findings. As per critical care he is similar as he was a few years ago when they discuss hospice but he seems to bounce back. Dr Robb Matar d/w the patient  again he would like to cont aggressive treatment.  not making progress, pulm recommended hospice care.  Palliative care consulted, daughter is hesitating to initial hospice  now, but agreed to patient being discharged to snf with palliative care consult and to continue to discuss hospice options at SNF if patient not able to get better.  Atrial Fibrillation with RVR CHA2DS2-VASc Score is 6. Patient is on Eliquis. Heart rate is hard to control continues to be in mild RVR Cardiology on board, appreciate assistance, heart rate continues to improve. Continues to be in RVR despite increasing diltiazem. Appreciate cardiology's assistance. Continue adjust med, heart rate difficult to control.  Chronic, systolic and diastolic heart failure: Seems to be euvolemic, continue current regimen. The echo was done that shows an ejection fraction of 25% On low dose lasix  CAD: Asymptomatic continue medical management.   Controlled diabetes mellitus type 2: A1c was 6.7. Titrate Lantus, his blood glucose continues to be high likely due to steroids. Taper steroids Had hypoglycemia episode on 1/30 , d/c lantus, continue ssi, patient refusing nutrition supplement, will try megace  Acute confusional state: has required prn haldol and morphine, daughter concerned that patient might be too sedated from iv morphine, iv morphine decreased to  prn, change haldol to prn dose Resolved, continue Haldol when necessary. Has not need haldol in the last two days  BPH: Continue Flomax and Proscar  Constipation: started stool regimen, declined enema, states will waiting for a while before thinking about enema  Malnutrition of moderate degree, nutrition supplement  Oral thrush: agreed to topical nystatin, add diflucan   DVT Prophylaxis - on apixaban  Family Communication: patient  Disposition Plan: Pulmonary recommended palliative care and signed off, snf with palliative care continue to follow at SNF on 1/31 Code Status:   Procedures:  none  Consultations:  Cardiology  Pulmonary /critical care  Palliative care  Discharge Exam: BP 100/66 mmHg  Pulse 86   Temp(Src) 97.4 F (36.3 C) (Oral)  Resp 18  Ht  (1.753 m)  Wt 76.068 kg (167 lb 11.2 oz)  BMI 24.75 kg/m2  SpO2 98%  Gen: Very frail, no gurgling heard today, lethargic with intermittent agitation Cardiovascular: IRRR. Chest and lungs: Overall diminished, no wheezing today Abdomen: Abdomen soft, NT/ND, + BS Extremities: No edema. Skin: diffuse erythema upper extremity, worse on the left.   Discharge Instructions You were cared for by a hospitalist during your hospital stay. If you have any questions about your discharge medications or the care you received while you were in the hospital after you are discharged, you can call the unit and asked to speak with the hospitalist on call if the hospitalist that took care of you is not available. Once you are discharged, your primary care physician will handle any further medical issues. Please note that NO REFILLS for any discharge medications will be authorized once you are discharged, as it is imperative that you return to your primary care physician (or establish a relationship with a primary care physician if you do not have one) for your aftercare needs so that they can reassess your need for medications and monitor your lab values.      Discharge Instructions    Diet - low sodium heart healthy    Complete by:  As directed      Increase activity slowly    Complete by:  As directed             Medication List    STOP  taking these medications        bisoprolol 10 MG tablet  Commonly known as:  ZEBETA     diltiazem 240 MG 24 hr capsule  Commonly known as:  DILACOR XR     glimepiride 2 MG tablet  Commonly known as:  AMARYL     morphine 10 MG/5ML solution  Replaced by:  morphine CONCENTRATE 10 MG/0.5ML Soln concentrated solution      TAKE these medications        albuterol (2.5 MG/3ML) 0.083% nebulizer solution  Commonly known as:  PROVENTIL  Take 2.5 mg by nebulization every 6 (six) hours as needed for wheezing  or shortness of breath.     ALPRAZolam 0.25 MG tablet  Commonly known as:  XANAX  Take 1 tablet (0.25 mg total) by mouth at bedtime as needed for anxiety.     beclomethasone 80 MCG/ACT inhaler  Commonly known as:  QVAR  Inhale 1 puff into the lungs 2 (two) times daily.     cholecalciferol 1000 units tablet  Commonly known as:  VITAMIN D  Take 2,000 Units by mouth daily.     digoxin 0.125 MG tablet  Commonly known as:  LANOXIN  Take 0.125 mg by mouth daily.     diltiazem 360 MG 24 hr capsule  Commonly known as:  CARDIZEM CD  Take 1 capsule (360 mg total) by mouth daily.     DULoxetine 30 MG capsule  Commonly known as:  CYMBALTA  Take 1 capsule (30 mg total) by mouth daily.     ELIQUIS 5 MG Tabs tablet  Generic drug:  apixaban  Take 5 mg by mouth 2 (two) times daily.     feeding supplement (ENSURE ENLIVE) Liqd  Take 237 mLs by mouth 2 (two) times daily between meals.     finasteride 5 MG tablet  Commonly known as:  PROSCAR  Take 5 mg by mouth daily.     fluticasone 50 MCG/ACT nasal spray  Commonly known as:  FLONASE  Place 2 sprays into both nostrils daily.     furosemide 20 MG tablet  Commonly known as:  LASIX  Take 2 tablets (40 mg total) by mouth daily.     guaiFENesin 600 MG 12 hr tablet  Commonly known as:  MUCINEX  Take 600 mg by mouth at bedtime.     ipratropium 0.02 % nebulizer solution  Commonly known as:  ATROVENT  Take 2.5 mLs (0.5 mg total) by nebulization every 6 (six) hours.     levalbuterol 0.63 MG/3ML nebulizer solution  Commonly known as:  XOPENEX  Take 3 mLs (0.63 mg total) by nebulization every 6 (six) hours as needed for wheezing or shortness of breath.     lidocaine 5 %  Commonly known as:  LIDODERM  Place 1 patch onto the skin daily. Remove & Discard patch within 12 hours or as directed by MD     megestrol 400 MG/10ML suspension  Commonly known as:  MEGACE  Take 10 mLs (400 mg total) by mouth daily.     metoprolol 200 MG 24 hr  tablet  Commonly known as:  TOPROL-XL  Take 1 tablet (200 mg total) by mouth daily. Take with or immediately following a meal.     morphine CONCENTRATE 10 MG/0.5ML Soln concentrated solution  Place 0.13 mLs (2.6 mg total) under the tongue every 2 (two) hours as needed for severe pain or shortness of breath.     nitroGLYCERIN 0.4 MG SL tablet  Commonly known as:  NITROSTAT  Place 0.4 mg under the tongue every 5 (five) minutes as needed (MAX 3 TABLETS). For chest pain     nystatin 100000 UNIT/ML suspension  Commonly known as:  MYCOSTATIN  Take 5 mLs (500,000 Units total) by mouth 4 (four) times daily.     polyethylene glycol packet  Commonly known as:  MIRALAX / GLYCOLAX  Take 17 g by mouth daily.     predniSONE 10 MG tablet  Commonly known as:  DELTASONE  Take 1 tablet (10 mg total) by mouth daily with breakfast. Label  & dispense according to the schedule below. 4 Pills PO for 3 days then, 3 Pills PO for 3 days, 2Pills PO for 3 days, 1 Pills PO for 3 days,1/2 Pill  PO for long term.     rosuvastatin 40 MG tablet  Commonly known as:  CRESTOR  Take 40 mg by mouth daily.     sennosides-docusate sodium 8.6-50 MG tablet  Commonly known as:  SENOKOT-S  Take 2 tablets by mouth daily as needed for constipation.     SPIRIVA HANDIHALER 18 MCG inhalation capsule  Generic drug:  tiotropium  Place 18 mcg into inhaler and inhale daily.     tamsulosin 0.4 MG Caps capsule  Commonly known as:  FLOMAX  Take 0.4 mg by mouth 2 (two) times daily.     traMADol 50 MG tablet  Commonly known as:  ULTRAM  Take 50 mg by mouth every 8 (eight) hours as needed. for pain       No Known Allergies    The results of significant diagnostics from this hospitalization (including imaging, microbiology, ancillary and laboratory) are listed below for reference.    Significant Diagnostic Studies: Dg Chest Port 1 View  10/10/2015  CLINICAL DATA:  Congestion, shortness of Breath EXAM: PORTABLE CHEST 1 VIEW  COMPARISON:  10/04/2015 FINDINGS: There is hyperinflation of the lungs compatible with COPD. Left pacer remains in place, unchanged. Mild cardiomegaly. No focal airspace opacities or effusions. Stable right basilar pleural thickening. No acute bony abnormality. IMPRESSION: COPD, cardiomegaly.  No active disease. Electronically Signed   By: Charlett Nose M.D.   On: 10/10/2015 12:56   Dg Chest Port 1 View  10/04/2015  CLINICAL DATA:  Respiratory failure. EXAM: PORTABLE CHEST 1 VIEW COMPARISON:  10/02/2015 .  09/30/2015.  03/29/2015.  03/27/2014 . FINDINGS: Mediastinum and hilar structures normal. Cardiac pacer in stable position. Cardiomegaly. Stable right base pleural parenchymal thickening. No new infiltrate. Cardiac pacer with lead tip projected over the right ventricle . Cardiomegaly with normal pulmonary vascularity. No pleural effusion or pneumothorax . IMPRESSION: 1. Cardiac pacer with lead tip projected over the right ventricle. Stable cardiomegaly. Scratched 2. Right base pleural-parenchymal thickening consistent with scarring. Electronically Signed   By: Maisie Fus  Register   On: 10/04/2015 07:46   Dg Chest Port 1 View  10/02/2015  CLINICAL DATA:  82 year old male with shortness of breath EXAM: PORTABLE CHEST 1 VIEW COMPARISON:  Radiograph dated 09/30/2015 FINDINGS: Single-view of the chest demonstrates emphysematous changes of the lungs. Mild interstitial prominence may represent a degree of congestion. No focal consolidation. There is blunting of the right costophrenic angle likely a small pleural effusion. No pneumothorax. Stable cardiomegaly. The osseous structures appear unremarkable. IMPRESSION: Cardiomegaly with possible mild congestion.  No focal consolidation. Emphysema.  No pneumothorax. Small right pleural effusion. Electronically Signed   By: Elgie Collard M.D.   On: 10/02/2015 05:36   Dg Chest [facility]  09/30/2015  CLINICAL DATA:  Acute onset of respiratory distress. Initial  encounter. EXAM: PORTABLE CHEST 1 VIEW COMPARISON:  Chest radiograph performed 03/29/2015 FINDINGS: The lungs are hyperexpanded, with flattening of the hemidiaphragms, compatible with COPD. There is chronic blunting of the right costophrenic angle. Chronically increased interstitial markings are seen. No definite acute focal airspace consolidation, pleural effusion or pneumothorax is identified. The cardiomediastinal silhouette is mildly enlarged. A pacemaker is noted overlying the left chest wall, with a single lead ending overlying the right ventricle. No acute osseous abnormalities are seen. IMPRESSION: Findings of COPD; mild cardiomegaly.  Lungs otherwise grossly clear. Electronically Signed   By: Roanna Raider M.D.   On: 09/30/2015 04:04    Microbiology: No results found for this or any previous visit (from the past 240 hour(s)).   Labs: Basic Metabolic Panel:  Recent Labs Lab 10/11/15 0345 10/12/15 0355 10/13/15 0506 10/15/15 0515 10/16/15 0550 10/17/15 0115  NA  --  138  --  132* 134* 133*  K  --  4.0  --  4.5 4.1 4.0  CL  --  94*  --  94* 96* 91*  CO2  --  35*  --  30 29 33*  GLUCOSE  --  99  --  166* 86 162*  BUN  --  35*  --  41* 40* 43*  CREATININE  --  0.79  --  0.75 0.81 0.99  CALCIUM  --  8.5*  --  8.0* 8.2* 8.0*  MG 2.2 2.2 2.3  --   --   --   PHOS 2.7 3.0 3.3  --   --   --    Liver Function Tests:  Recent Labs Lab 10/16/15 0550  AST 34  ALT 31  ALKPHOS 103  BILITOT 1.1  PROT 4.7*  ALBUMIN 2.5*   No results for input(s): LIPASE, AMYLASE in the last 168 hours. No results for input(s): AMMONIA in the last 168 hours. CBC:  Recent Labs Lab 10/12/15 0355 10/16/15 0550  WBC 19.3* 23.4*  HGB 15.2 15.9  HCT 48.2 48.9  MCV 95.3 93.0  PLT 154 120*   Cardiac Enzymes:  Recent Labs Lab 10/16/15 1017 10/16/15 2238 10/17/15 0115  TROPONINI 0.26* 0.22* 0.25*   BNP: BNP (last 3 results)  Recent Labs  09/30/15 0300 10/01/15 0424 10/17/15 0115  BNP  287.0* 737.9* 290.4*    ProBNP (last 3 results) No results for input(s): PROBNP in the last 8760 hours.  CBG:  Recent Labs Lab 10/16/15 0807 10/16/15 1229 10/16/15 1706 10/16/15 2158 10/17/15 0741  GLUCAP 75 96 223* 255* 130*       Signed:  Chryl Holten MD, PhD  [facility] 10/17/2015, 8:32 AM

## 2015-10-18 ENCOUNTER — Non-Acute Institutional Stay (SKILLED_NURSING_FACILITY): Payer: Medicare Other | Admitting: Internal Medicine

## 2015-10-18 DIAGNOSIS — I482 Chronic atrial fibrillation: Secondary | ICD-10-CM

## 2015-10-18 DIAGNOSIS — E46 Unspecified protein-calorie malnutrition: Secondary | ICD-10-CM

## 2015-10-18 DIAGNOSIS — I4821 Permanent atrial fibrillation: Secondary | ICD-10-CM

## 2015-10-18 DIAGNOSIS — J441 Chronic obstructive pulmonary disease with (acute) exacerbation: Secondary | ICD-10-CM

## 2015-10-18 DIAGNOSIS — B370 Candidal stomatitis: Secondary | ICD-10-CM

## 2015-10-18 DIAGNOSIS — D72829 Elevated white blood cell count, unspecified: Secondary | ICD-10-CM | POA: Diagnosis not present

## 2015-10-18 DIAGNOSIS — R627 Adult failure to thrive: Secondary | ICD-10-CM

## 2015-10-18 DIAGNOSIS — J9621 Acute and chronic respiratory failure with hypoxia: Secondary | ICD-10-CM

## 2015-10-18 DIAGNOSIS — R5381 Other malaise: Secondary | ICD-10-CM | POA: Diagnosis not present

## 2015-10-18 DIAGNOSIS — K5900 Constipation, unspecified: Secondary | ICD-10-CM | POA: Diagnosis not present

## 2015-10-18 DIAGNOSIS — I5042 Chronic combined systolic (congestive) and diastolic (congestive) heart failure: Secondary | ICD-10-CM

## 2015-11-15 NOTE — Progress Notes (Signed)
Patient ID: [name], male   DOB: 09-11-1928, 82 y.o.   MRN: [id number]     LOCATION: [facility]   PCP: Martha Clan, MD   Code Status: DNR  Goals of care: Advanced Directive information Advanced Directives 09/30/2015  Does patient have an advance directive? Yes  Type of Estate agent of [place];Living will  Does patient want to make changes to advanced directive? -  Copy of advanced directive(s) in chart? Yes  Pre-existing out of facility DNR order (yellow form or pink MOST form) -   Extended Emergency Contact Information Primary Emergency Contact: Burns,Marcia Address: [address]          [place], [place] [address] [place] Home Phone: [phone] Mobile Phone: [phone] Relation: Daughter Secondary Emergency Contact: Allyson Sabal Address: [address]          [place], [place] [address] [place] Home Phone: [phone] Mobile Phone: [phone] Relation: Relative   No Known Allergies  Chief Complaint  Patient presents with  . New Admit To SNF     HPI:  Patient is a 82 y.o. male seen today for short term rehabilitation post hospital admission from 09/30/15-10/17/15 with acute on chronic res[iratory failurewith copd exacerbation and acute metabolic encephalopathy. He received BiPAP, iv morphine and iv steroids. Pulmonary and critical care recommended hospice but family wanted aggressive treatment. Palliative care was consulted and rehabilitation with palliative care follow up in facility was opted. He was in afib with RVR and required titration of his rate controlling agent. He has PMH of COPD, CHF with EF 25%, afib, CAD, dm type 2 among others. He is seen today in his room with his nurse at bedside first and later again with his daughter and son in law present. He is in bed, dyspneic and using his accessory muscles. He does not participate in HPI and ROS. He has bluish  discoloration of his lips and fingertips.    Review of Systems: unable to obtain from patient. Per nursing, has not taken his morning medications. Per daughter was giving one word answer to some of her questions last night.    Past Medical History  Diagnosis Date  . Pneumonia 12/06/10    healthcare-associated/ left lower lobe  . COPD (chronic obstructive pulmonary disease) (HCC)     severe stage IV  . Atrial fibrillation (HCC)     on Coumadin with St. Jude single-chamber pacemaker  . Diabetes mellitus     type 2; s/p Prednisone therapy for pneumonia 12/2010  . Coronary artery disease     s/p anterior STEMI 09/2009; cath. revealed mid LAD 40%, distal LAD 100%- PTCA, prox. RCA 40%, mid. RCA 100% with good collateral filling of PDA; EF 25%; NSTEMI 07/2011 - PCI/DES 100% LCX  . ST elevation (STEMI) myocardial infarction (HCC) 01//11/12    anterior  . CHF (congestive heart failure) (HCC)     EF 25% on 09/26/10; EF 50-55% and grade 1 diastolic dysfunction on ECHO 08/2010   . Hypertension   . Hyperlipidemia   . AAA (abdominal aortic aneurysm) (HCC)     3 cm infrarenal abdominal aortic aneurysm per aorta ultrasound 03/2010  . Osteoarthritis     s/p bilat hip arthroplasty  . Angina   . Ischemic cardiomyopathy     EF 35% LHC 11/12  . GERD (gastroesophageal reflux disease)   . Hypercholesteremia   . PVD (peripheral vascular disease) (HCC)   . Anxiety  Past Surgical History  Procedure Laterality Date  . Hip arthroplasty      s/p right 09/27/10 and s/p left 09/24/10  . Back surgery    . Knee surgery    . Pacemaker insertion  12/2010    St. Jude single-chamber   . Cardiac catheterization  09/2009, 06/2007    PTCA to distal LAD  . Percutaneous coronary stent intervention (pci-s) N/A 08/13/2011    Procedure: PERCUTANEOUS CORONARY STENT INTERVENTION (PCI-S);  Surgeon: Tonny Bollman, MD;  Location: [facility] CATH LAB;  Service: Cardiovascular;  Laterality: N/A;  . Left heart catheterization  with coronary angiogram N/A 08/13/2011    Procedure: LEFT HEART CATHETERIZATION WITH CORONARY ANGIOGRAM;  Surgeon: Tonny Bollman, MD;  Location: [facility] CATH LAB;  Service: Cardiovascular;  Laterality: N/A;   Social History:   reports that he quit smoking about 6 years ago. His smoking use included Cigarettes. He has a 100 pack-year smoking history. He has never used smokeless tobacco. He reports that he drinks alcohol. He reports that he does not use illicit drugs.  Family History  Problem Relation Age of Onset  . Heart attack Mother 67  . Heart attack Father 79  . Cancer Other     siblings  . Heart attack Other     Medications:   Medication List       This list is accurate as of: October 30, 2015 12:46 PM.  Always use your most recent med list.               albuterol (2.5 MG/3ML) 0.083% nebulizer solution  Commonly known as:  PROVENTIL  Take 2.5 mg by nebulization every 6 (six) hours as needed for wheezing or shortness of breath.     ALPRAZolam 0.25 MG tablet  Commonly known as:  XANAX  Take 1 tablet (0.25 mg total) by mouth at bedtime as needed for anxiety.     beclomethasone 80 MCG/ACT inhaler  Commonly known as:  QVAR  Inhale 1 puff into the lungs 2 (two) times daily.     cholecalciferol 1000 units tablet  Commonly known as:  VITAMIN D  Take 2,000 Units by mouth daily.     digoxin 0.125 MG tablet  Commonly known as:  LANOXIN  Take 0.125 mg by mouth daily.     diltiazem 360 MG 24 hr capsule  Commonly known as:  CARDIZEM CD  Take 1 capsule (360 mg total) by mouth daily.     DULoxetine 30 MG capsule  Commonly known as:  CYMBALTA  Take 1 capsule (30 mg total) by mouth daily.     ELIQUIS 5 MG Tabs tablet  Generic drug:  apixaban  Take 5 mg by mouth 2 (two) times daily.     feeding supplement (ENSURE ENLIVE) Liqd  Take 237 mLs by mouth 2 (two) times daily between meals.     finasteride 5 MG tablet  Commonly known as:  PROSCAR  Take 5 mg by mouth daily.      fluticasone 50 MCG/ACT nasal spray  Commonly known as:  FLONASE  Place 2 sprays into both nostrils daily.     furosemide 20 MG tablet  Commonly known as:  LASIX  Take 2 tablets (40 mg total) by mouth daily.     guaiFENesin 600 MG 12 hr tablet  Commonly known as:  MUCINEX  Take 600 mg by mouth at bedtime.     ipratropium 0.02 % nebulizer solution  Commonly known as:  ATROVENT  Take 2.5 mLs (0.5 mg total)  by nebulization every 6 (six) hours.     levalbuterol 0.63 MG/3ML nebulizer solution  Commonly known as:  XOPENEX  Take 3 mLs (0.63 mg total) by nebulization every 6 (six) hours as needed for wheezing or shortness of breath.     lidocaine 5 %  Commonly known as:  LIDODERM  Place 1 patch onto the skin daily. Remove & Discard patch within 12 hours or as directed by MD     megestrol 400 MG/10ML suspension  Commonly known as:  MEGACE  Take 10 mLs (400 mg total) by mouth daily.     metoprolol 200 MG 24 hr tablet  Commonly known as:  TOPROL-XL  Take 1 tablet (200 mg total) by mouth daily. Take with or immediately following a meal.     morphine CONCENTRATE 10 MG/0.5ML Soln concentrated solution  Place 0.13 mLs (2.6 mg total) under the tongue every 2 (two) hours as needed for severe pain or shortness of breath.     nitroGLYCERIN 0.4 MG SL tablet  Commonly known as:  NITROSTAT  Place 0.4 mg under the tongue every 5 (five) minutes as needed (MAX 3 TABLETS). For chest pain     nystatin 100000 UNIT/ML suspension  Commonly known as:  MYCOSTATIN  Take 5 mLs (500,000 Units total) by mouth 4 (four) times daily.     polyethylene glycol packet  Commonly known as:  MIRALAX / GLYCOLAX  Take 17 g by mouth daily.     predniSONE 10 MG tablet  Commonly known as:  DELTASONE  Take 1 tablet (10 mg total) by mouth daily with breakfast. Label  & dispense according to the schedule below. 4 Pills PO for 3 days then, 3 Pills PO for 3 days, 2Pills PO for 3 days, 1 Pills PO for 3 days,1/2 Pill  PO for  long term.     rosuvastatin 40 MG tablet  Commonly known as:  CRESTOR  Take 40 mg by mouth daily.     sennosides-docusate sodium 8.6-50 MG tablet  Commonly known as:  SENOKOT-S  Take 2 tablets by mouth daily as needed for constipation.     SPIRIVA HANDIHALER 18 MCG inhalation capsule  Generic drug:  tiotropium  Place 18 mcg into inhaler and inhale daily.     tamsulosin 0.4 MG Caps capsule  Commonly known as:  FLOMAX  Take 0.4 mg by mouth 2 (two) times daily.     traMADol 50 MG tablet  Commonly known as:  ULTRAM  Take 50 mg by mouth every 8 (eight) hours as needed. for pain        Immunizations: Immunization History  Administered Date(s) Administered  . Influenza Split 05/18/2011, 06/16/2012, 06/16/2013, 06/16/2014  . Influenza,inj,Quad PF,36+ Mos 07/18/2015  . Pneumococcal Conjugate-13 06/16/2013  . Pneumococcal Polysaccharide-23 05/17/2010     Physical Exam: Filed Vitals:   11/09/2015 1245  BP: 131/66  Pulse: 80  Temp: 97.8 F (36.6 C)  Resp: 24  SpO2: 95%   General- elderly frail and ill appearing male patient in respiratory distress  Head- normocephalic, atraumatic Nose- no maxillary or frontal sinus tenderness, no nasal discharge Throat- dry mucus membrane Eyes- PERRLA, EOMI, no pallor, no icterus, no discharge, normal conjunctiva, normal sclera Neck- no cervical lymphadenopathy Cardiovascular- tachycardia +, irregular HR Respiratory- bilateral poor air movement, rhonchi +, using his neck and abdominal muscles, on o2 5 l Abdomen- bowel sounds present, soft, non tender Musculoskeletal- generalized anasarca, bruising to his skin, dressing to LUE., generalized weakness Neurological- unable to assess  Skin- warm and dry, no diaphoresis    Labs reviewed: Basic Metabolic Panel:  Recent Labs  16/10/96 0345 10/12/15 0355 10/13/15 0506 10/15/15 0515 10/16/15 0550 10/17/15 0115  NA  --  138  --  132* 134* 133*  K  --  4.0  --  4.5 4.1 4.0  CL  --  94*   --  94* 96* 91*  CO2  --  35*  --  30 29 33*  GLUCOSE  --  99  --  166* 86 162*  BUN  --  35*  --  41* 40* 43*  CREATININE  --  0.79  --  0.75 0.81 0.99  CALCIUM  --  8.5*  --  8.0* 8.2* 8.0*  MG 2.2 2.2 2.3  --   --   --   PHOS 2.7 3.0 3.3  --   --   --    Liver Function Tests:  Recent Labs  10/01/15 0424 10/16/15 0550  AST 29 34  ALT 30 31  ALKPHOS 86 103  BILITOT 1.0 1.1  PROT 6.3* 4.7*  ALBUMIN 3.9 2.5*   No results for input(s): LIPASE, AMYLASE in the last 8760 hours. No results for input(s): AMMONIA in the last 8760 hours. CBC:  Recent Labs  09/30/15 0300  10/04/15 0357 10/12/15 0355 10/16/15 0550  WBC 10.8*  < > 14.2* 19.3* 23.4*  NEUTROABS 7.6  --   --   --   --   HGB 14.6  < > 15.1 15.2 15.9  HCT 46.8  < > 49.0 48.2 48.9  MCV 97.9  < > 98.4 95.3 93.0  PLT 148*  < > 161 154 120*  < > = values in this interval not displayed. Cardiac Enzymes:  Recent Labs  10/16/15 1017 10/16/15 2238 10/17/15 0115  TROPONINI 0.26* 0.22* 0.25*   BNP: Invalid input(s): POCBNP CBG:  Recent Labs  10/16/15 2158 10/17/15 0741 10/17/15 1204  GLUCAP 255* 130* 157*    Assessment/Plan  Physical deconditioning Poor prognosis, frail and in respiratory distress. Reviewed care plan with daughter and recommended hospice. She does not want hospice at present but is fine with palliative care. She would like for him to work with therapy team as tolerated. Explained that I doubt him participating any at present but will have therapy team assess in the noon to see if he will participate.  Failure to thrive Patient appears to be actively declining. Reviewed goals of care with family, comfort care is the main goal with therapy as tolerated  Acute on chronic respiratory failure Continues to use his accessory muscle. Continue o2 for now. Give morphine q2h prn for now and 1 stat. Will provide duoenb nebulizer x 1 now and then as needed. Continue alprazolam 0.25 mg but change to q6h  prn anxiety  Copd exacerbation Continue o2 and prednisone taper for now. Monitor clinically, continue his breathing rx  Leukocytosis With him on steroids, no further lab work for now. Monitor clinically  Pill burden Reviewed goal of care with daughter and will discontinue vitamin d, cymbalta, proscar, flonase, crestor, senokot s, miralax, spiriva, qvar  afib Tachycardic, not taking medication this am. Currently on diltiazem 360 mg daily and metoprolol succinate 200 mg daily with eliquis. Daughter wants this to be continued. Monitor clinically for now  Protein calorie malnutrition With anasarca. Get dietary to follow and patient to take protein supplement, high aspiration risk at present. Continue megace  CHF Ef 25%. Continue metoprolol and lasix  Constipation  Discontinue kiralax and senokot s and add dulcolax suppository prn for now  Oral thrush Oral hygiene and nystatin for now   Goals of care: short term rehabilitation and palliative care but will need hospice   Labs/tests ordered: none  Family/ staff Communication: reviewed care plan with patient's daughter, charge nurse, nursing supervisor, DON and [facility] team    Oneal Grout, MD Internal Medicine [facility] [name] [place], [place] [address] Cell Phone (Monday-Friday 8 am - 5 pm): [phone] On Call: [phone] and follow prompts after 5 pm and on weekends Office Phone: [phone] Office Fax: [phone]

## 2015-11-15 DEATH — deceased
